# Patient Record
Sex: Male | Born: 1950 | Race: Black or African American | Hispanic: No | Marital: Married | Smoking: Former smoker
Health system: Southern US, Community
[De-identification: ages and names within clinical notes are randomized; demographics above are authoritative.]

## PROBLEM LIST (undated history)

## (undated) DIAGNOSIS — I5042 Chronic combined systolic (congestive) and diastolic (congestive) heart failure: Secondary | ICD-10-CM

## (undated) DIAGNOSIS — I4892 Unspecified atrial flutter: Secondary | ICD-10-CM

## (undated) DIAGNOSIS — I442 Atrioventricular block, complete: Secondary | ICD-10-CM

## (undated) DIAGNOSIS — E7800 Pure hypercholesterolemia, unspecified: Secondary | ICD-10-CM

## (undated) DIAGNOSIS — I10 Essential (primary) hypertension: Secondary | ICD-10-CM

## (undated) DIAGNOSIS — I4719 Other supraventricular tachycardia: Secondary | ICD-10-CM

## (undated) DIAGNOSIS — J189 Pneumonia, unspecified organism: Secondary | ICD-10-CM

## (undated) DIAGNOSIS — D6859 Other primary thrombophilia: Secondary | ICD-10-CM

## (undated) DIAGNOSIS — I82409 Acute embolism and thrombosis of unspecified deep veins of unspecified lower extremity: Secondary | ICD-10-CM

## (undated) DIAGNOSIS — I471 Supraventricular tachycardia: Secondary | ICD-10-CM

## (undated) DIAGNOSIS — I428 Other cardiomyopathies: Secondary | ICD-10-CM

## (undated) DIAGNOSIS — Z95810 Presence of automatic (implantable) cardiac defibrillator: Secondary | ICD-10-CM

## (undated) DIAGNOSIS — Z950 Presence of cardiac pacemaker: Secondary | ICD-10-CM

## (undated) DIAGNOSIS — I2699 Other pulmonary embolism without acute cor pulmonale: Secondary | ICD-10-CM

## (undated) DIAGNOSIS — G4730 Sleep apnea, unspecified: Secondary | ICD-10-CM

## (undated) DIAGNOSIS — M542 Cervicalgia: Secondary | ICD-10-CM

## (undated) HISTORY — DX: Atrioventricular block, complete: I44.2

## (undated) HISTORY — PX: INSERT / REPLACE / REMOVE PACEMAKER: SUR710

## (undated) HISTORY — DX: Other pulmonary embolism without acute cor pulmonale: I26.99

## (undated) HISTORY — DX: Other primary thrombophilia: D68.59

## (undated) HISTORY — DX: Cervicalgia: M54.2

## (undated) HISTORY — PX: FRACTURE SURGERY: SHX138

---

## 1978-10-06 HISTORY — PX: INGUINAL HERNIA REPAIR: SUR1180

## 1997-12-23 ENCOUNTER — Encounter (HOSPITAL_BASED_OUTPATIENT_CLINIC_OR_DEPARTMENT_OTHER): Payer: Self-pay | Admitting: General Surgery

## 1997-12-26 ENCOUNTER — Ambulatory Visit (HOSPITAL_COMMUNITY): Admission: RE | Admit: 1997-12-26 | Discharge: 1997-12-26 | Payer: Self-pay | Admitting: General Surgery

## 2005-02-04 HISTORY — PX: FOOT FRACTURE SURGERY: SHX645

## 2005-11-29 ENCOUNTER — Ambulatory Visit (HOSPITAL_BASED_OUTPATIENT_CLINIC_OR_DEPARTMENT_OTHER): Admission: RE | Admit: 2005-11-29 | Discharge: 2005-11-29 | Payer: Self-pay | Admitting: Orthopedic Surgery

## 2005-12-04 ENCOUNTER — Inpatient Hospital Stay (HOSPITAL_COMMUNITY): Admission: EM | Admit: 2005-12-04 | Discharge: 2005-12-09 | Payer: Self-pay | Admitting: Emergency Medicine

## 2005-12-05 ENCOUNTER — Encounter: Payer: Self-pay | Admitting: Vascular Surgery

## 2006-01-03 ENCOUNTER — Ambulatory Visit: Payer: Self-pay | Admitting: Internal Medicine

## 2006-04-18 ENCOUNTER — Ambulatory Visit: Payer: Self-pay | Admitting: *Deleted

## 2006-04-18 ENCOUNTER — Encounter (INDEPENDENT_AMBULATORY_CARE_PROVIDER_SITE_OTHER): Payer: Self-pay | Admitting: *Deleted

## 2006-04-18 ENCOUNTER — Ambulatory Visit (HOSPITAL_COMMUNITY): Admission: RE | Admit: 2006-04-18 | Discharge: 2006-04-18 | Payer: Self-pay | Admitting: *Deleted

## 2006-05-08 ENCOUNTER — Ambulatory Visit: Payer: Self-pay | Admitting: Internal Medicine

## 2006-11-20 ENCOUNTER — Ambulatory Visit: Payer: Self-pay | Admitting: Oncology

## 2007-01-23 ENCOUNTER — Ambulatory Visit: Payer: Self-pay | Admitting: Oncology

## 2007-01-23 LAB — CBC WITH DIFFERENTIAL/PLATELET
BASO%: 0.4 % (ref 0.0–2.0)
Basophils Absolute: 0 10*3/uL (ref 0.0–0.1)
EOS%: 1.7 % (ref 0.0–7.0)
Eosinophils Absolute: 0.1 10*3/uL (ref 0.0–0.5)
HCT: 40.4 % (ref 38.7–49.9)
HGB: 14 g/dL (ref 13.0–17.1)
LYMPH%: 34.6 % (ref 14.0–48.0)
MCH: 29.9 pg (ref 28.0–33.4)
MCHC: 34.7 g/dL (ref 32.0–35.9)
MCV: 86 fL (ref 81.6–98.0)
MONO#: 0.4 10*3/uL (ref 0.1–0.9)
MONO%: 10.2 % (ref 0.0–13.0)
NEUT#: 2.2 10*3/uL (ref 1.5–6.5)
NEUT%: 53.1 % (ref 40.0–75.0)
Platelets: 209 10*3/uL (ref 145–400)
RBC: 4.7 10*6/uL (ref 4.20–5.71)
RDW: 15.3 % — ABNORMAL HIGH (ref 11.2–14.6)
WBC: 4.2 10*3/uL (ref 4.0–10.0)
lymph#: 1.5 10*3/uL (ref 0.9–3.3)

## 2007-01-23 LAB — PROTIME-INR: INR: 2.8 (ref 2.00–3.50)

## 2007-01-27 LAB — FACTOR 5 LEIDEN

## 2007-01-27 LAB — PROTHROMBIN GENE MUTATION

## 2007-02-03 ENCOUNTER — Encounter: Payer: Self-pay | Admitting: Internal Medicine

## 2007-02-03 LAB — PROTHROMBIN TIME: Prothrombin Time: 25.8 seconds — ABNORMAL HIGH (ref 11.6–15.2)

## 2007-02-03 LAB — CBC WITH DIFFERENTIAL/PLATELET
BASO%: 0.9 % (ref 0.0–2.0)
LYMPH%: 37.7 % (ref 14.0–48.0)
MCHC: 34.4 g/dL (ref 32.0–35.9)
MCV: 86.6 fL (ref 81.6–98.0)
MONO%: 9.4 % (ref 0.0–13.0)
NEUT%: 51.3 % (ref 40.0–75.0)
Platelets: 215 10*3/uL (ref 145–400)
RBC: 4.9 10*6/uL (ref 4.20–5.71)

## 2007-02-06 LAB — D-DIMER, QUANTITATIVE: D-Dimer, Quant: 0.22 ug/mL-FEU (ref 0.00–0.48)

## 2007-02-12 ENCOUNTER — Ambulatory Visit: Payer: Self-pay | Admitting: Surgery

## 2010-02-25 ENCOUNTER — Encounter: Payer: Self-pay | Admitting: *Deleted

## 2010-06-19 NOTE — Procedures (Signed)
DUPLEX DEEP VENOUS EXAM - LOWER EXTREMITY   INDICATION:  Followup, right lower extremity thrombus and previous PE.   HISTORY:  Edema:  No.  Trauma/Surgery:  Multiple fractures in right lower extremity in 2007.  Pain:  No.  PE:  Previously, yes.  Previous DVT:  Right greater saphenous vein SVT noted on 04/18/2006.  Anticoagulants:  No.  Other:   DUPLEX EXAM:                CFV   SFV   PopV  PTV    GSV                R  L  R  L  R  L  R   L  R  L  Thrombosis    0  0  +     0     0      +  Spontaneous   +  +  +     +     +      D  Phasic        +  +  +     +     +      D  Augmentation  +  +  +     +     +      D  Compressible  +  +  +     +     +      D  Competent     +  +  +     +     +      D   Legend:  + - yes  o - no  p - partial  D - decreased   IMPRESSION:  1. The superficial femoral vein in the proximal thigh shows a very      minimal focal area of chronic deep venous thrombosis that is not      flow-limiting.  2. All other deep veins appear patent.  3. Right greater saphenous vein in the calf shows chronic thrombus.  4. Left common femoral vein shows no evidence of deep venous      thrombosis.   Dr. Patsy Lager office called with results.    _____________________________  Quita Skye Hart [place], M.D.   AS/MEDQ  D:  02/12/2007  T:  02/13/2007  Job:  [id number]

## 2010-06-22 NOTE — Assessment & Plan Note (Signed)
[facility]                             PULMONARY OFFICE NOTE   Philip Chavez, Philip Chavez                        MRN:          [id number]  DATE:01/03/2006                            DOB:          [birth year]    REFERRING PHYSICIAN:  Lucita Ferrara, MD   REASON FOR CONSULTATION:  Pulmonary embolism.   HISTORY:  This is a very nice 60 year old back male, never smoker, who  was anthelotic and very active prior to injuring his right foot at work  on the 26th with a fracture of the 4th and 5th metatarsal joints.  He  underwent surgery on the 26th, then woke up with dyspnea and bilateral,  right greater than left, anterior chest discomfort on October 31 and was  diagnosed bilateral pulmonary emboli in the emergency room.  Attempt was  made to do a venous Doppler of the right leg, but because of the cast,  could not be done.  He was anticoagulated, and returns now at Dr.  Vertis Kelch request for evaluation.  The patient denies any dyspnea, but of  course is very limited because of his cast.  His pleuritic pain resolved  by the time he was discharged, and has not recurred.  He denies any  excess leg swelling or family history of clotting disorders.   PAST MEDICAL HISTORY:  Significant for the absences of major operations  or medical illnesses.   ALLERGIES:  NONE KNOWN.   MEDICATIONS:  Coumadin per Dr. Vertis Kelch office.   SOCIAL HISTORY:  He has never smoked.   FAMILY HISTORY:  Taken in detail to give [place] the absence of clotting  disorders.   REVIEW OF SYSTEMS:  Also taken in detail, negative except for outlined  above.   PHYSICAL EXAMINATION:  This is a healthy-appearing, ambulatory black  male, in no acute distress.  He does not appear significantly  overweight.  Afebrile, normal vital signs.  HEENT:  Mouth and oropharynx clear.  LUNG FIELDS:  Clear bilaterally to auscultation and percussion.  There  is no increase in pulmonary __________.  ABDOMEN:  Soft and  benign with no palpable organomegaly, masses or  tenderness.  EXTREMITIES:  Warm without calf tenderness, cyanosis or clubbing, though  he did have a cast on the right leg.   Hemoglobin saturation is 96% room air.   CT scan was reviewed from December 04, 2005 showing extensive bilateral  emboli.  There was small pleural calcification in the right lower chest  of unknown significance.   Lab data was reviewed from the hospitalization.  I do not see a  hypercoagulable profile, although it is mentioned by Dr. Flonnie Overman that this  was negative.   IMPRESSION:  1. Pulmonary embolism in the setting of right foot trauma followed by      right foot surgery.  He has no evidence of right heart failure, and      as long as he progresses completely back to normal in terms of      exercise tolerance, I would not recommend any further workup from a      pulmonary  perspective.  If he is still limited at 6 months from      doing any activity physically that he would have otherwise been      able to do prior to the pulmonary embolism, I would like to see him      back here to examine the issue of possible thromboembolic pulmonary      hypertension, although this seems very unlikely.  2. I feel a little bit uncomfortable not knowing whether he had a clot      in the right leg in terms of making a recommendation, but the      normal recommendation is for 6 months of therapy.  At the end of 6      months, I would probably go ahead and do a venous Doppler then to      see if there is any residual evidence of venous disease.  If so,      Coumadin could be continued indefinitely.  If not, Coumadin could      stopped.  3. Having had a pulmonary embolism previously with no family history,      I do not necessarily think a hypercoagulable profile is necessary      given the risk factor of recent surgery, but from now on the      patient should take a baby aspirin, and was cautioned against      activities that  promote clotting such as long trips or      immobilization, etc.  4. I did emphasize to the patient that his prognosis is excellent, and      that we would be happy to see him back here if he is not 100%      improved by the end of 6 months, but that 6 months of Coumadin is      standard for this pattern of clot.     Charlaine Dalton. Sherene Sires, MD, [facility]  Electronically Signed    MBW/MedQ  DD: 01/03/2006  DT: 01/04/2006  Job #: [id number]   cc:   Lucita Ferrara, MD

## 2010-06-22 NOTE — Op Note (Signed)
[name], [name]:  [id number]   MEDICAL RECORD NO.:  [id number]          PATIENT TYPE:  AMB   LOCATION:  DSC                          FACILITY:  [facility]   PHYSICIAN:  Harvie Junior, M.D.   DATE OF BIRTH:  02/04/1951   DATE OF PROCEDURE:  11/29/2005  DATE OF DISCHARGE:                                 OPERATIVE REPORT   PREOPERATIVE DIAGNOSIS:  1. Fracture-dislocation of the fourth and fifth metatarsal cuboid joints.  2. Fracture-dislocation of the naviculocuneiform joints.   POSTOPERATIVE DIAGNOSIS:  1. Fresh fracture-dislocation of the fourth and fifth metatarsal cuboid      joints.  2. 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   SURGEON:  Harvie Junior, M.D.   ASSISTANT:  Marshia Ly, P.A.   ANESTHESIA:  General.   BRIEF HISTORY:  Philip Chavez is a 60 year old male with a long history of  having had a fall from a ladder.  He ultimately was evaluated by Dr.  Althea Chavez who ultimately got a CT scan of him which showed that he had some  dislocation of the metatarsal fourth and fifth metatarsophalangeal joint and  also showed some questionable fracture of the anterior process of the  calcaneus as well as some injury to the naviculocuneiform joint.  He was  treated conservatively initially and CT scan was obtained which showed all  of this injury and then he was sent for evaluation.  At that point he did  appear to have pain over the midfoot as well as pain over the lateral foot  and with the CAT scan images in hand and after a discussion with the  radiologist about the findings, we certainly felt as though he had this  unusual injury pattern where instead of the injury going across the Lisfranc  joint it went across the Lisfranc joint and then through the talonavicular  joint.  Anyway he was taken to the operating room for treatment of these  injuries.   PROCEDURE:  Patient was taken to the  operating room.  After adequate  anesthesia was obtained with general anesthetic, the patient was placed on  the operating table.  The right leg was prepped and draped in the usual,  sterile fashion.  Following this the palpation of the fourth and fifth  metatarsals was palpated and certainly there was dorsal subluxation of the  fourth on the cuboid.  This was pushed down and distracted and this was  pinned to the third metatarsal.  Good fixation was achieved here.  The fifth  metatarsal was then pulled into place and was able to be fixed to the  cuboid.   Attention was then turned towards the midfoot and with significant palpation  and mobility of the foot, no significant clicks or clunks were palpated.  At  that point it was felt that open reduction would be necessary.  An incision  was made under fluoroscopic imaging essentially between the talus and the  calcaneus and in between the cuboid and the middle cuneiform and over  the  talus in that area.  The subcutaneous tissue was dissected down to the level  of these joints and the joint capsules were identified and basically the  talus was identified.  The navicular was identified.  The medial middle and  lateral cuneiform were identified.  The cuboid was identified.  We again  began trying to reduce this situation in the cuneiform complex, which was  united together, really could not be brought plantar at all.  The navicular  could be attempted to be brought dorsally but certainly would not hold.  We  were able to get an elevator under the navicular.  We were able to put  pressure on the medial side of the navicular and with significant pressure  and taking a lamina spreader between the talus and the cuneiforms, the  navicular would not reduce in any way and the cuneiforms would not reduce on  the navicular.   At that point we began to feel as though this injury may be somewhat old.  Reviewing of the CAT scan intraoperatively did show  that there was some non  smooth articular surface to the distal navicular, wondering again could this  be an old injury.  At any rate the reduction of the most lateral cuneiform  to the cuboid was undertaken and that held without needing significant  fixation.  This wound was then copiously irrigated.  We spent about an hour  trying to reduce this situation and became very convinced at that point that  this was not a new situation, that would not reduce.   At that point the wound was copiously irrigated.  The skin was closed with 4-  0 nylon interrupted sutures.  A sterile compressive dressing was applied and  the pins were bent and cut to the fourth and fifth metatarsal fractures and  a sterile compressive dressing was applied.  The patient was taken to  recovery and was noted to be in satisfactory condition.   ESTIMATED BLOOD LOSS:  None.      Harvie Junior, M.D.  Electronically Signed     JLG/MEDQ  D:  11/29/2005  T:  11/30/2005  Job:  [id number]

## 2010-06-22 NOTE — Assessment & Plan Note (Signed)
[facility]                             PULMONARY OFFICE NOTE   MARQUAIL, BRADWELL                        MRN:          [id number]  DATE:05/08/2006                            DOB:          May 17, 1950    HISTORY:  A 60 year old black male, never smoker, with pulmonary  embolism in early November of 2007. My understanding is that he had  venous Doppler, then it was negative and he returns now 6 months later  with no significant dyspnea, but underwent a venous Doppler prior to the  decision about stopping Coumadin which showed extensive clot involving  the saphenous vein on the right, the same side as his injury.  He denies  any dyspnea at all with activity but is not doing aerobics.  He says he  is doing everything he was doing before his injury, in terms of  exertion, without dyspnea or pleuritic pain.   PHYSICAL EXAMINATION:  He is a robust, ambulatory black man in no acute  distress.  He has stable vital signs.  HEENT:  Unremarkable.  Oropharynx clear.  LUNGS:  Clear bilaterally to auscultation and percussion.  HEART:  Regular rate and rhythm.  No increase in P2.  ABDOMEN:  Soft, benign.  EXTREMITIES:  Warm without calf tenderness, cyanosis, clubbing or edema.  The right leg appeared normal to me.   IMPRESSION:  Asymptomatic clot involving the greater saphenous vein of  the right lower extremity by venous Doppler.  It may well be that this  was a clot that extended from the original injury and was not detectable  at the time of the original venous Doppler because he had a cast on the  leg.  This does not mean that he is refractory to Coumadin by any  means, because note all of his pulmonary symptoms have resolved.  It is,  however, an indication that he should continue on Coumadin at least for  the next six months and have a venous Doppler repeated at that time.  I  would also at that time consider a hematology evaluation by Dr.  Marlena Clipper  for hypercoagulable workup and certainly this should be  done earlier should he develop any symptoms that would suggest deep vein  thrombosis, pulmonary embolisms while on therapy with Coumadin.  In  general, however, the hypercoagulable workup needs to be done a week  after he stops Coumadin to avoid false positive on the workup.   Further pulmonary followup, however, is not needed in this clinic unless  Dr. Flonnie Overman has specific questions related to his management that are not  answered above.     Charlaine Dalton. Sherene Sires, MD, [facility]  Electronically Signed    MBW/MedQ  DD: 05/08/2006  DT: 05/08/2006  Job #: [id number]   cc:   Lucita Ferrara, MD

## 2010-06-22 NOTE — Discharge Summary (Signed)
Philip Chavez, Philip Chavez               ACCOUNT NO.:  [id number]   MEDICAL RECORD NO.:  [id number]          PATIENT TYPE:  INP   LOCATION:  4728                         FACILITY:  [facility]   PHYSICIAN:  Hollice Espy, M.D.DATE OF BIRTH:  May 25, 1950   DATE OF ADMISSION:  12/04/2005  DATE OF DISCHARGE:  12/09/2005                                 DISCHARGE SUMMARY   DISCHARGE DIAGNOSES:  1. Multiple pulmonary emboli.  2. Recent injury to right foot secondary to status post repair of fracture      dislocation of fourth and fifth metatarsal likely contributing to deep      venous thrombi and eventual pulmonary embolus.   DISCHARGE MEDICATIONS:  Coumadin 10 mg p.o. q.h.s.   HOSPITAL COURSE:  The patient is a 60 year old African-American male with a  no past medical history who presented to the emergency room with shortness  of breath on December 04, 2005.  He had recently had a fall from a ladder  leading to fracture dislocation of his fourth and fifth toes on his right  foot and underwent surgical repair on November 29, 2005.  Since that time, he  has been laid up and has decreased mobility.  He presented with shortness of  breath and was found by CT scan to have bilateral PE's.  On evaluation, he  had no evidence of any hypoxia, and the patient was started on Lovenox and  Coumadin.   His hospital course was unremarkable.  He was started on the Coumadin and  initially heparin protocol which was changed over to Lovenox during his  hospitalization.  He initially was on 10 mg of Coumadin with a slow increase  which was up to 12.5 and 15 mg; and by November 08, 2005, his Coumadin level  was at 2.  At this point, we are discharging him on 10 mg of Coumadin p.o.  q.h.s.  He has also received a Coumadin instruction video.  I set up an  appointment with Dr. __________ at [facility] on December 13, 2005 at 10  a.m.  The patient will follow up with Dr. __________ for repeat Coumadin  check.   The  patient's overall disposition is improved.  His activity is as  tolerated.  He is able to ambulate fairly well on his crutches.  His  discharge diet will be a regular diet; although, he is advised to avoid  beefy green vegetables, and he is being discharged to home.      Hollice Espy, M.D.  Electronically Signed     SKK/MEDQ  D:  12/09/2005  T:  12/09/2005  Job:  [id number]   cc:   Dr. __________

## 2010-06-22 NOTE — H&P (Signed)
NAMEKOLE, HILYARD NO.:  [id number]   MEDICAL RECORD NO.:  [id number]          PATIENT TYPE:  INP   LOCATION:  1823                         FACILITY:  [facility]   PHYSICIAN:  Deirdre Peer. Polite, M.D. DATE OF BIRTH:  [birth year]   DATE OF ADMISSION:  12/04/2005  DATE OF DISCHARGE:                                HISTORY & PHYSICAL   CHIEF COMPLAINT:  Shortness of breath.   HISTORY OF PRESENT ILLNESS:  A 60 year old male with no significant past  medical history.  He presented to the ED with acute onset of shortness of  breath. According to the patient he was in his usual state of health until  last night when he had the acute onset of shortness of breath.  He thought  it was gas. He took some Mylanta which he thought that he may have gotten  some relief; however, in fact he really did not.  He tolerated the  discomfort throughout the night and presented to the ED today.  In the ED  the patient was evaluated and was saturating 97%. Vitals were stable.  CBC  within normal limits.  BMET within normal limits.  UA within normal limits.  The patient's chest x-ray was within normal limits.  The patient had a CT of  the chest which showed bilateral PEE, and this ED for further evaluation and  treatment.   At the time of my evaluation the patient was alert and oriented x3, still  with some symptoms of shortness of breath.  Denies any hemoptysis.  No fever  or chills.  Denies any previous clots in the lung or in the leg.  Denies any  family history of those problems as well. Admission is deemed necessary for  further evaluation and treatment.   PAST MEDICAL HISTORY:  None.   MEDICATIONS:  None.   SOCIAL HISTORY:  Negative for tobacco, alcohol or drugs.   PAST SURGICAL HISTORY:  Left inguinal hernia repair approximately 7-8 years  ago.  Recent right foot surgery secondary to a traumatic injury.  The  patient had TENS, according to him to repair fracture/dislocation of  the  fourth and fifth metatarsal cuboid joints also fracture dislocation of the  naviculocuneiform joints.   ALLERGIES:  None.   FAMILY HISTORY:  Noncontributory.   REVIEW OF SYSTEMS:  As stated in the HPI.   PHYSICAL EXAMINATION:  GENERAL:  Alert and oriented x3.  VITAL SIGNS:  Stable, afebrile.  HEENT:  Within normal limits.  CHEST:  Moderate air movement without rales or rubs.  CARDIOVASCULAR:  Regular.  ABDOMEN:  Soft, nontender.  EXTREMITIES:  Right lower leg is wrapped in a soft cast with Ace bandage.  The patient currently denies any calf pain.  Left leg without any calf pain  or tenderness.   DATA:  As stated in the HPI.   ASSESSMENT:  1. Bilateral pulmonary embolism.  2. Recent trauma to the right foot. The patient stated that he fell off of      a ladder, had surgery on 11/29/2005 for repair of the fracture  dislocation of the fourth and fifth metatarsal cuboid joints and      fracture dislocation of the naviculocuneiform joints.   RECOMMEND:  The patient is to be admitted to a telemetry bed.  The patient  will be given IV heparin, crossed over with Coumadin.  Will obtain and  ultrasound to rule out DVT.  The patient's risk factor more than likely is  recent trauma and surgery with resultant immobilization.  Will make further  recommendations after obtaining x-ray.      Deirdre Peer. Polite, M.D.  Electronically Signed     RDP/MEDQ  D:  12/04/2005  T:  12/04/2005  Job:  [id number]

## 2013-03-11 ENCOUNTER — Inpatient Hospital Stay (HOSPITAL_COMMUNITY)
Admission: EM | Admit: 2013-03-11 | Discharge: 2013-03-18 | DRG: 242 | Disposition: A | Discharge status: Home / Self Care | Payer: BC Managed Care – PPO | Attending: Family Medicine | Admitting: Family Medicine

## 2013-03-11 ENCOUNTER — Emergency Department (HOSPITAL_COMMUNITY): Payer: BC Managed Care – PPO

## 2013-03-11 ENCOUNTER — Emergency Department (HOSPITAL_COMMUNITY)
Admission: EM | Admit: 2013-03-11 | Discharge: 2013-03-11 | Disposition: A | Discharge status: Hospice | Payer: BC Managed Care – PPO | Source: Home / Self Care | Attending: Family Medicine | Admitting: Family Medicine

## 2013-03-11 ENCOUNTER — Inpatient Hospital Stay (HOSPITAL_COMMUNITY): Payer: BC Managed Care – PPO

## 2013-03-11 ENCOUNTER — Encounter (HOSPITAL_COMMUNITY): Payer: Self-pay | Admitting: Emergency Medicine

## 2013-03-11 DIAGNOSIS — M7989 Other specified soft tissue disorders: Secondary | ICD-10-CM

## 2013-03-11 DIAGNOSIS — M542 Cervicalgia: Secondary | ICD-10-CM

## 2013-03-11 DIAGNOSIS — I82409 Acute embolism and thrombosis of unspecified deep veins of unspecified lower extremity: Secondary | ICD-10-CM

## 2013-03-11 DIAGNOSIS — Z86711 Personal history of pulmonary embolism: Secondary | ICD-10-CM

## 2013-03-11 DIAGNOSIS — I472 Ventricular tachycardia, unspecified: Secondary | ICD-10-CM | POA: Diagnosis present

## 2013-03-11 DIAGNOSIS — Z86718 Personal history of other venous thrombosis and embolism: Secondary | ICD-10-CM | POA: Diagnosis present

## 2013-03-11 DIAGNOSIS — I4729 Other ventricular tachycardia: Secondary | ICD-10-CM | POA: Diagnosis present

## 2013-03-11 DIAGNOSIS — Z7901 Long term (current) use of anticoagulants: Secondary | ICD-10-CM

## 2013-03-11 DIAGNOSIS — D6859 Other primary thrombophilia: Secondary | ICD-10-CM

## 2013-03-11 DIAGNOSIS — I498 Other specified cardiac arrhythmias: Secondary | ICD-10-CM | POA: Diagnosis present

## 2013-03-11 DIAGNOSIS — I2699 Other pulmonary embolism without acute cor pulmonale: Secondary | ICD-10-CM | POA: Diagnosis present

## 2013-03-11 DIAGNOSIS — I442 Atrioventricular block, complete: Principal | ICD-10-CM

## 2013-03-11 DIAGNOSIS — I824Y9 Acute embolism and thrombosis of unspecified deep veins of unspecified proximal lower extremity: Secondary | ICD-10-CM | POA: Diagnosis present

## 2013-03-11 DIAGNOSIS — R9431 Abnormal electrocardiogram [ECG] [EKG]: Secondary | ICD-10-CM

## 2013-03-11 HISTORY — DX: Acute embolism and thrombosis of unspecified deep veins of unspecified lower extremity: I82.409

## 2013-03-11 LAB — CBC WITH DIFFERENTIAL/PLATELET
BASOS PCT: 0 % (ref 0–1)
Basophils Absolute: 0 10*3/uL (ref 0.0–0.1)
EOS PCT: 1 % (ref 0–5)
Eosinophils Absolute: 0.1 10*3/uL (ref 0.0–0.7)
HCT: 42.4 % (ref 39.0–52.0)
Hemoglobin: 14.9 g/dL (ref 13.0–17.0)
LYMPHS PCT: 30 % (ref 12–46)
Lymphs Abs: 1.5 10*3/uL (ref 0.7–4.0)
MCH: 30.8 pg (ref 26.0–34.0)
MCHC: 35.1 g/dL (ref 30.0–36.0)
MCV: 87.8 fL (ref 78.0–100.0)
Monocytes Absolute: 0.5 10*3/uL (ref 0.1–1.0)
Monocytes Relative: 10 % (ref 3–12)
Neutro Abs: 3 10*3/uL (ref 1.7–7.7)
Neutrophils Relative %: 59 % (ref 43–77)
PLATELETS: 201 10*3/uL (ref 150–400)
RBC: 4.83 MIL/uL (ref 4.22–5.81)
RDW: 13.2 % (ref 11.5–15.5)
WBC: 5.1 10*3/uL (ref 4.0–10.5)

## 2013-03-11 LAB — COMPREHENSIVE METABOLIC PANEL
ALT: 27 U/L (ref 0–53)
AST: 24 U/L (ref 0–37)
Albumin: 3.7 g/dL (ref 3.5–5.2)
Alkaline Phosphatase: 92 U/L (ref 39–117)
BUN: 15 mg/dL (ref 6–23)
CALCIUM: 9.4 mg/dL (ref 8.4–10.5)
CO2: 26 meq/L (ref 19–32)
Chloride: 103 mEq/L (ref 96–112)
Creatinine, Ser: 1.04 mg/dL (ref 0.50–1.35)
GFR calc Af Amer: 87 mL/min — ABNORMAL LOW (ref >90)
GFR calc non Af Amer: 75 mL/min — ABNORMAL LOW (ref >90)
Glucose, Bld: 106 mg/dL — ABNORMAL HIGH (ref 70–99)
Potassium: 4.5 mEq/L (ref 3.7–5.3)
SODIUM: 140 meq/L (ref 137–147)
Total Bilirubin: 0.3 mg/dL (ref 0.3–1.2)
Total Protein: 8.2 g/dL (ref 6.0–8.3)

## 2013-03-11 LAB — TROPONIN I: Troponin I: <0.3 ng/mL (ref <0.30)

## 2013-03-11 LAB — PROTIME-INR
INR: 1.01 (ref 0.00–1.49)
Prothrombin Time: 13.1 seconds (ref 11.6–15.2)

## 2013-03-11 LAB — APTT: APTT: 25 s (ref 24–37)

## 2013-03-11 LAB — MAGNESIUM: MAGNESIUM: 1.9 mg/dL (ref 1.5–2.5)

## 2013-03-11 LAB — MRSA PCR SCREENING: MRSA BY PCR: NEGATIVE

## 2013-03-11 MED ORDER — HEPARIN BOLUS VIA INFUSION
4000.0000 [IU] | Freq: Once | INTRAVENOUS | Status: AC
  Administered 2013-03-11: 4000 [IU] via INTRAVENOUS
  Filled 2013-03-11: qty 4000

## 2013-03-11 MED ORDER — NITROGLYCERIN 0.4 MG SL SUBL
SUBLINGUAL_TABLET | SUBLINGUAL | Status: AC
  Filled 2013-03-11: qty 25

## 2013-03-11 MED ORDER — SODIUM CHLORIDE 0.9 % IJ SOLN
3.0000 mL | Freq: Two times a day (BID) | INTRAMUSCULAR | Status: DC
  Administered 2013-03-11 – 2013-03-17 (×7): 3 mL via INTRAVENOUS
  Administered 2013-03-17: 21:00:00 via INTRAVENOUS
  Administered 2013-03-18: 11:00:00 3 mL via INTRAVENOUS

## 2013-03-11 MED ORDER — SODIUM CHLORIDE 0.9 % IV SOLN: Freq: Once | INTRAVENOUS | Status: DC

## 2013-03-11 MED ORDER — RIVAROXABAN 20 MG PO TABS: 20.0000 mg | ORAL_TABLET | Freq: Every day | ORAL | Status: DC

## 2013-03-11 MED ORDER — IOHEXOL 350 MG/ML SOLN
100.0000 mL | Freq: Once | INTRAVENOUS | Status: AC | PRN
  Administered 2013-03-11: 100 mL via INTRAVENOUS

## 2013-03-11 MED ORDER — NITROGLYCERIN 0.4 MG SL SUBL: 0.4000 mg | SUBLINGUAL_TABLET | SUBLINGUAL | Status: DC | PRN

## 2013-03-11 MED ORDER — IBUPROFEN 800 MG PO TABS
800.0000 mg | ORAL_TABLET | Freq: Once | ORAL | Status: AC
  Administered 2013-03-11: 800 mg via ORAL
  Filled 2013-03-11: qty 1

## 2013-03-11 MED ORDER — HEPARIN (PORCINE) IN NACL 100-0.45 UNIT/ML-% IJ SOLN
1600.0000 [IU]/h | INTRAMUSCULAR | Status: DC
  Administered 2013-03-11 (×3): 1600 [IU]/h via INTRAVENOUS
  Filled 2013-03-11: qty 250

## 2013-03-11 MED ORDER — CYCLOBENZAPRINE HCL 10 MG PO TABS
5.0000 mg | ORAL_TABLET | Freq: Once | ORAL | Status: AC
  Administered 2013-03-11: 5 mg via ORAL
  Filled 2013-03-11: qty 1

## 2013-03-11 MED ORDER — SODIUM CHLORIDE 0.9 % IV SOLN
Freq: Once | INTRAVENOUS | Status: AC
  Administered 2013-03-11: 11:00:00 via INTRAVENOUS

## 2013-03-11 MED ORDER — ASPIRIN 81 MG PO CHEW
324.0000 mg | CHEWABLE_TABLET | Freq: Once | ORAL | Status: AC
  Administered 2013-03-11: 324 mg via ORAL

## 2013-03-11 MED ORDER — ASPIRIN 81 MG PO CHEW
CHEWABLE_TABLET | ORAL | Status: AC
  Filled 2013-03-11: qty 4

## 2013-03-11 MED ORDER — RIVAROXABAN 15 MG PO TABS
15.0000 mg | ORAL_TABLET | Freq: Two times a day (BID) | ORAL | Status: DC
Start: 2013-03-11 — End: 2013-03-15
  Administered 2013-03-11 – 2013-03-14 (×7): 15 mg via ORAL
  Filled 2013-03-11 (×11): qty 1

## 2013-03-11 NOTE — H&P (Signed)
[facility] Admission History and Physical Service Pager: [phone]  Patient name: Philip Chavez Medical record number: [id number] Date of birth: 16-Aug-1950 Age: 63 y.o. Gender: male  Primary Care Provider: Pcp Not In System Consultants: Cardiology Code Status: Full  Chief Complaint: swollen left leg  Assessment and Plan: Philip Chavez is a 63 y.o. male presenting with 5 day history of left swollen leg and found to have DVT extending from posterior tibial vein to common femoral vein & bilateral PE with RLL infarct, also new diagnosis of 3rd degree AV block. 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  # LLE DVT & Bilateral PE: ultrasound confirmed DVT of left leg in posterior tibial, popliteal, femoral, common femoral veins; CFV DVT appears to be mobile. Denies SOB or CP, but CT angio PE shows bilateral lower lobe acute PE (moderate burden) and small RLL pulmonary infarct. CXR showed atelectasis vs infiltrate of RLL, however given CT this is likely the infarct (no WBC, no fever). Will start on heparin drip but will need to decide xarelto vs coumadin (previously on coumadin). Lab workup otherwise normal, no leukocytosis or anemia, trop neg x 1. Has previous workup for hypercoagulability in 2008 showing negative FVL, prothrombin gene mutation, antithrombin III level normal. - admit to stepdown, attending Dr. Mingo Amber - started on heparin DVT dosing per pharmacy  # Complete AV block: asymptomatic at this time, no syncopal episodes. Bradycardia to 40s, BP initially stable at 140-150s/60-70s. Seen by electrophysiology, likely recommending pacemaker. - telemetry - cycle troponins x 3 - appreciate cardiology recommendations in care of this patient - NPO until decision regarding pacer  # Neck pain: likely musculoskeletal in nature, very low suspicion for concerning infection like meningitis - received   FEN/GI: diet NPO pending cardiology recs, saline lock  iv Prophylaxis: on heparin per above  Disposition: admit to SDU  History of Present Illness: Philip Chavez is a 63 y.o. male presenting with 5 day history of sudden left leg swelling. He woke up and noticed his leg was swollen, but it is not painful and is not red or warm to touch. The swelling has remained the same since it started. He is able to walk without difficulty. 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 He denies CP or palpitations, SOB, abdominal pain, dizziness, passing out. He has a history of a DVT/PE in 2007 after he had surgery for a right foot fracture, he says he was on coumadin for about 1 year. He also woke up this morning with pain in the left side of his neck. He denies headache, fevers, chills. The pain hurts when he looks to the right. He thinks he may have slept in a bad position, as he has never had this pain before. The pain does not radiate when he moves his head.  Today he went to urgent care to be evaluated and was also found to have a very low heart rate in 40s, and on EKG appeared to be complete AV block. He was given nitroglycerin and aspirin before being sent over to ED to be evaluated further. Found to have a clot in left leg extending from posterior tibial to common femoral (CFV DVT appears mobile) on ultrasound. Seen by cardiology who has recommended a pacer.  Review Of Systems: Per HPI with the following additions: none Otherwise 12 point review of systems was performed and was unremarkable.  There  are no active problems to display for this patient.  Past Medical History: History reviewed. No pertinent past medical history. Past Surgical History: Past Surgical History  Procedure Laterality Date  . 2007 right foot fracture with surgical repair     Social History: History  Substance Use Topics  . Smoking status: Never  . Smokeless tobacco: Not on file  . Alcohol Use: Has not had alcohol  in years   Additional social history: none  Please also refer to relevant sections of EMR.  Family History: No history of clot/blood disorders Father and sister both have a pacer. Father has passed away. Brother also may have a heart condition  Allergies and Medications: No Known Allergies No current facility-administered medications on file prior to encounter.   No current outpatient prescriptions on file prior to encounter.    Objective: BP 151/74  Pulse 42  Temp(Src) 97.7 F (36.5 C) (Oral)  Resp 21  SpO2 100% Exam: General: NAD HEENT: PERRL, EOMI Neck: full ROM, mildly tender to palpation of left paraspinal area Cardiovascular: Bradycardic, but normal s1/s2, no murmurs Respiratory: CTAB, effort normal. No w/r/c appreciated Abdomen: soft, NTND, normal bowel sounds Extremities: Left leg/foot obviously more swollen than right. Left calf measures 42.5cm at the greatest, right calf measures 39cm. Nontender, no palpable cords. Both feet are WWP. Skin: warm and dry, no rashes noted Neuro: A+Ox4, no focal deficits. Strength 5/5 toe extension/flexion bilaterally, sensation to light touch intact to dorsum of foot bilaterally  Labs and Imaging: CBC BMET   Recent Labs Lab 03/11/13 1148  WBC 5.1  HGB 14.9  HCT 42.4  PLT 201    Recent Labs Lab 03/11/13 1148  NA 140  K 4.5  CL 103  CO2 26  BUN 15  CREATININE 1.04  GLUCOSE 106*  CALCIUM 9.4    INR 1.01 Trop neg x 1 Mag 1.9  X-ray c-spine IMPRESSION:  1. Reversal of the usual cervical lordosis which may reflect  positioning and/or spasm.  2. No evidence of fracture or static signs of instability.  3. Multilevel degenerative disc disease and spondylosis, worst at  C5-6, with multilevel foraminal stenoses.  2v CXR: IMPRESSION:  1. Atelectasis versus pneumonia involving the right lower lobe.  2. Stable mild cardiomegaly without pulmonary edema.  CT angio PE protocol: IMPRESSION:  1. Bilateral lower lobe  acute pulmonary emboli. Overall clot burden  is moderate.  2. Small right lower lobe pulmonary infarction.  Preliminary Vascular Ultrasound  Lower extremity venous duplex has been completed. Preliminary findings:  LEFT = DVT involving the common femoral, femoral, popliteal, and posterior tibial veins. The DVT noted in the CFV appears to be mobile.  Right = no evidence of DVT.   Tawanna Sat, MD 03/11/2013, 3:54 PM PGY-1, [place] Intern pager: [phone], text pages welcome  Teaching Service Addendum. I have seen and evaluated this pt and agree with Dr. Marissa Calamity assessment and plan as it is documented on this note.   D. [place] Philippa Sicks, MD Family Medicine  PGY-3

## 2013-03-11 NOTE — ED Notes (Signed)
Patient transported to CT 

## 2013-03-11 NOTE — ED Notes (Addendum)
Pt arrives via [facility] from Urgent Care. Pt went to Urgent Care this morning for neck stiffness that was present upon awakening this morning. Denies injury. Pt also c/o left leg swelling for approx 4-5 days. Pt denies chest pain, SOB, lightheaded, dizziness. Pt with bradycardia in the 40's.

## 2013-03-11 NOTE — Progress Notes (Signed)
*  PRELIMINARY RESULTS* Vascular Ultrasound Lower extremity venous duplex has been completed.  Preliminary findings:  LEFT = DVT involving the common femoral, femoral, popliteal, and posterior tibial veins. The DVT noted in the CFV appears to be mobile.  Right = no evidence of DVT.   Called results to Dr. Orpah Clinton, [place], RVT  03/11/2013, 3:35 PM

## 2013-03-11 NOTE — Consult Note (Signed)
ANTICOAGULATION CONSULT NOTE - Initial Consult  Pharmacy Consult for Heparin Indication: DVT, r/o PE  No Known Allergies  Patient Measurements: Height: 6\' 1"  (185.4 cm) Weight: 205 lb (92.987 kg) IBW/kg (Calculated) : 79.9 Heparin Dosing Weight: 92kg  Vital Signs: Temp: 97.7 F (36.5 C) (02/05 1143) Temp src: Oral (02/05 1143) BP: 165/75 mmHg (02/05 1544) Pulse Rate: 40 (02/05 1544)  Labs:  Recent Labs  03/11/13 1148  HGB 14.9  HCT 42.4  PLT 201  CREATININE 1.04  TROPONINI <0.30    Estimated Creatinine Clearance: 83.2 ml/min (by C-G formula based on Cr of 1.04).   Medical History: History reviewed. No pertinent past medical history.  Medications:  No anticoagulants pta  Assessment: 62yom presents to the ED with left leg pain and complete heart block. He is found to have a LLE DVT. CT angio pending to rule out PE. Also seen by EP who recommends PPM for his heart block, but patient unsure if he wants to proceed. He will begin IV heparin for now. Baseline labs wnl.  Goal of Therapy:  Heparin level 0.3-0.7 units/ml Monitor platelets by anticoagulation protocol: Yes   Plan:  1) Heparin bolus 4000 units x 1 2) Heparin drip at 1600 units/hr 3) 6 hour heparin level 4) Daily heparin level and CBC  Deboraha Sprang 03/11/2013,4:02 PM

## 2013-03-11 NOTE — ED Notes (Signed)
MD at bedside.-Dr. Knapp 

## 2013-03-11 NOTE — ED Notes (Signed)
Patient transported to Ultrasound 

## 2013-03-11 NOTE — ED Provider Notes (Signed)
CSN: [id number]     Arrival date & time 03/11/13  1128 History   First MD Initiated Contact with Patient 03/11/13 1129     Chief Complaint  Patient presents with  . Neck Pain  . Leg Swelling   (Consider location/radiation/quality/duration/timing/severity/associated sxs/prior Treatment) HPI Patient reports for the past 5-6 days he has had swelling of his left leg. He denies pain, he denies any wounds on his leg, he denies fever. He states he's never had it before. He does indicate he had DVT in his right leg many years ago after having surgery and also had a PE at that time. He states that the PE he had very severe sharp chest pain. He also states today he woke up with stiffness in the left side of his neck. He denies any change in activity. He's never had that before. He states the pain is in his left side of his neck it hurts when he looks to the right. He denies any numbness in his arm or tingling. He is able to walk and use his arms normally. He has never had this before. He denies chest pain, shortness of breath, dizziness, or feeling lightheaded. He denies feeling weak. He has not had cough, nausea, vomiting, diarrhea, rhinorrhea, or fever. He was seen at urgent care today and noted to have a bradycardia and was sent to the ED. He was given a nitroglycerin and aspirin prior to being transferred.  Family history father and sister both have defibrillators  PCP Dr Janice Norrie Orthopedist Dr Berenice Primas for continued foot pain after a old fracture in 2007 it didn't heal well.  History reviewed. No pertinent past medical history. Past Surgical History  Procedure Laterality Date  . No past surgeries    surgery to right leg  No family history on file.  History  Substance Use Topics  . Smoking status: No  . Smokeless tobacco: Not on file  . Alcohol Use: No  lives at home Lives with spouse Retired in September  Review of Systems  All other systems reviewed and are negative.    Allergies    Review of patient's allergies indicates no known allergies.  Home Medications  No current outpatient prescriptions on file. BP 146/70  Temp(Src) 97.7 F (36.5 C) (Oral)  Resp 17  SpO2 100%  Vital signs normal    Physical Exam  Nursing note and vitals reviewed. Constitutional: He is oriented to person, place, and time. He appears well-developed and well-nourished.  Non-toxic appearance. He does not appear ill. No distress.  HENT:  Head: Normocephalic and atraumatic.  Right Ear: External ear normal.  Left Ear: External ear normal.  Nose: Nose normal. No mucosal edema or rhinorrhea.  Mouth/Throat: Oropharynx is clear and moist and mucous membranes are normal. No dental abscesses or uvula swelling.  Eyes: Conjunctivae and EOM are normal. Pupils are equal, round, and reactive to light.  Neck: Normal range of motion and full passive range of motion without pain. Neck supple.     Patient indicates he has pain in his left paraspinous muscles of the cervical spine when he looks to the right. He does not have discomfort when he looks to his left. The muscle is very tight to palpation. His midline bony cervical spine is nontender.  Cardiovascular: Normal rate, regular rhythm and normal heart sounds.  Exam reveals no gallop and no friction rub.   No murmur heard. Pulmonary/Chest: Effort normal and breath sounds normal. No respiratory distress. He has no wheezes. He  has no rhonchi. He has no rales. He exhibits no tenderness and no crepitus.  Abdominal: Soft. Normal appearance and bowel sounds are normal. He exhibits no distension. There is no tenderness. There is no rebound and no guarding.  Musculoskeletal: Normal range of motion. He exhibits no edema and no tenderness.  Moves all extremities well. Patient has diffuse swelling of his left leg, ankle and dorsum of foot compared to his right leg. He has minor discomfort to palpation in the high proximal calf region. There are no open wounds.  There is no warmth. There is no redness to the skin.  Neurological: He is alert and oriented to person, place, and time. He has normal strength. No cranial nerve deficit.  Skin: Skin is warm, dry and intact. No rash noted. No erythema. No pallor.  Psychiatric: He has a normal mood and affect. His speech is normal and behavior is normal. His mood appears not anxious.    ED Course  Procedures (including critical care time)  Medications  ibuprofen (ADVIL,MOTRIN) tablet 800 mg (800 mg Oral Given 03/11/13 1257)  cyclobenzaprine (FLEXERIL) tablet 5 mg (5 mg Oral Given 03/11/13 1257)  heparin per pharmacy  Pt's rhythm strips were reviewed off his monitor. He has variable PR intervals that appears to be a 3rd degree block with narrow QRS complexes.   12:28 Mickel Baas, [place] cardiology has reviewed his rhythm strips and agree it appears to be a 3rd degree block with high grade AV block. They will see patient in the ED.   Pt has been seen by cardiology and for now is refusing to have a pacemaker inserted.   15:30 Vascular Lab called, pt has DVT in his whole right leg vein system with ? Mobile clot in the femoral vein.   Pt started on anticoagulation, heparin per pharmacy. Pt given his test results and need for CT angio chest for possible PE with abnormal CXR but no fever, cough.   15:44 Dr Lamar Benes, [facility] will admit to tele, attending Dr Hinton Rao   Labs Review Results for orders placed during the hospital encounter of 03/11/13  CBC WITH DIFFERENTIAL      Result Value Range   WBC 5.1  4.0 - 10.5 K/uL   RBC 4.83  4.22 - 5.81 MIL/uL   Hemoglobin 14.9  13.0 - 17.0 g/dL   HCT 42.4  39.0 - 52.0 %   MCV 87.8  78.0 - 100.0 fL   MCH 30.8  26.0 - 34.0 pg   MCHC 35.1  30.0 - 36.0 g/dL   RDW 13.2  11.5 - 15.5 %   Platelets 201  150 - 400 K/uL   Neutrophils Relative % 59  43 - 77 %   Neutro Abs 3.0  1.7 - 7.7 K/uL   Lymphocytes Relative 30  12 - 46 %   Lymphs Abs 1.5  0.7 - 4.0 K/uL   Monocytes Relative 10  3 -  12 %   Monocytes Absolute 0.5  0.1 - 1.0 K/uL   Eosinophils Relative 1  0 - 5 %   Eosinophils Absolute 0.1  0.0 - 0.7 K/uL   Basophils Relative 0  0 - 1 %   Basophils Absolute 0.0  0.0 - 0.1 K/uL  COMPREHENSIVE METABOLIC PANEL      Result Value Range   Sodium 140  137 - 147 mEq/L   Potassium 4.5  3.7 - 5.3 mEq/L   Chloride 103  96 - 112 mEq/L   CO2 26  19 - 32 mEq/L   Glucose, Bld 106 (*) 70 - 99 mg/dL   BUN 15  6 - 23 mg/dL   Creatinine, Ser 1.04  0.50 - 1.35 mg/dL   Calcium 9.4  8.4 - 10.5 mg/dL   Total Protein 8.2  6.0 - 8.3 g/dL   Albumin 3.7  3.5 - 5.2 g/dL   AST 24  0 - 37 U/L   ALT 27  0 - 53 U/L   Alkaline Phosphatase 92  39 - 117 U/L   Total Bilirubin 0.3  0.3 - 1.2 mg/dL   GFR calc non Af Amer 75 (*) >90 mL/min   GFR calc Af Amer 87 (*) >90 mL/min  TROPONIN I      Result Value Range   Troponin I <0.30  <0.30 ng/mL  MAGNESIUM      Result Value Range   Magnesium 1.9  1.5 - 2.5 mg/dL   No results found.   Imaging Review Dg Chest 2 View  03/11/2013   CLINICAL DATA:  Bradycardia. Left lower extremity swelling. Prior history of pulmonary embolism.  EXAM: CHEST  2 VIEW  COMPARISON:  CT ANGIO CHEST dated 12/04/2005; DG CHEST 2 VIEW dated 12/04/2005  FINDINGS: Cardiac silhouette mildly enlarged but stable. Hilar and mediastinal contours otherwise unremarkable. Focal airspace opacity in the right lower lobe. Lungs otherwise clear. Bronchovascular markings normal. Pulmonary vascularity normal. No pneumothorax. No pleural effusions. Visualized bony thorax intact.  IMPRESSION: 1. Atelectasis versus pneumonia involving the right lower lobe. 2. Stable mild cardiomegaly without pulmonary edema.   Electronically Signed   By: Evangeline Dakin M.D.   On: 03/11/2013 12:47   Dg Cervical Spine Complete  03/11/2013   CLINICAL DATA:  Left-sided neck pain.  No known injuries.  EXAM: CERVICAL SPINE  4+ VIEWS  COMPARISON:  None.  FINDINGS: Reversal of the usual cervical lordosis centered at C4-5.  Anatomic posterior alignment. No visible fractures. Moderate to severe disc space narrowing and endplate hypertrophic changes at C4-5, C5-6, and C6-7, worst at C5-6. Ossification in the anterior annular fibers at C4-5. Normal prevertebral soft tissues. Facet joints intact. Severe bilateral foraminal stenoses suspected at C4-5, C5-6, and C6-7. No static evidence of instability.  IMPRESSION: 1. Reversal of the usual cervical lordosis which may reflect positioning and/or spasm. 2. No evidence of fracture or static signs of instability. 3. Multilevel degenerative disc disease and spondylosis, worst at C5-6, with multilevel foraminal stenoses.   Electronically Signed   By: Evangeline Dakin M.D.   On: 03/11/2013 12:50    CT angio chest pending  EKG Interpretation   None       MDM   1. DVT of lower extremity (deep venous thrombosis)   2. Third degree heart block   3. Musculoskeletal neck pain     Plan admission  Rolland Porter, MD, Alanson Aly, MD 03/11/13 [phone]

## 2013-03-11 NOTE — ED Notes (Signed)
Admitting phy at bedside  

## 2013-03-11 NOTE — ED Notes (Signed)
MD at bedside. Cardiology Consult.

## 2013-03-11 NOTE — ED Notes (Signed)
PATIENT WAS GIVEN NITRO, ASPIRIN, AND O2  WIFE WAS NOTIFIED AND CALLED INTO ROOM SO PROVIDER CAN SPEAK WITH HER ABOUT PATIENT'S CONDITION

## 2013-03-11 NOTE — ED Notes (Signed)
Patient transported to X-ray 

## 2013-03-11 NOTE — ED Notes (Signed)
Verified heparin with 2nd RN

## 2013-03-11 NOTE — ED Provider Notes (Signed)
Ryshawn Sanzone is a 63 y.o. male who presents to Urgent Care today for left leg swelling and pain. This is been present for several days. He denies any significant shortness of breath chest pain or palpitations. He denies any weakness dizziness or fatigue. He has a pertinent history of a pulmonary embolism secondary to a DVT following surgery on his right leg. This was a long time ago and he is not currently taking any medications. Additionally he notes mild left neck pain. This started today. He notes a tightness in his left trapezius. He denies any exertional pain or chest pain.   History reviewed. No pertinent past medical history. History  Substance Use Topics  . Smoking status: Not on file  . Smokeless tobacco: Not on file  . Alcohol Use: Not on file   ROS as above Medications: Current Facility-Administered Medications  Medication Dose Route Frequency Provider Last Rate Last Dose  . 0.9 %  sodium chloride infusion   Intravenous Once Gregor Hams, MD       No current outpatient prescriptions on file.    Exam:  BP 185/72  Pulse 44  Temp(Src) 98.2 F (36.8 C) (Oral)  Resp 20  SpO2 100% Gen: Well NAD HEENT: EOMI,  MMM Lungs: Normal work of breathing. CTABL Heart: Bradycardia irregular no MRG Abd: NABS, Soft. NT, ND Exts: Brisk capillary refill, warm and well perfused.  Left lower extremity swelling and edema right is normal. Neck: Mild left neck trapezius tenderness  Twelve-lead EKG: Sinus bradycardia with AV disassociation and junctional bradycardia. Ventricular rate is around 40 beats per minute. Inverted lateral precordial T wave. No ST segment elevation or depression. No Q waves. No prior EKG   Assessment and Plan: 63 y.o. male with left leg swelling and significant EKG abnormality. This is concerning for pulmonary embolism with heart strain versus and NSTEMI.  Plan to provide aspirin, nitroglycerin, IV fluids. Transfer to the emergency room via [facility] for further  evaluation and management.  Discussed warning signs or symptoms. Please see discharge instructions. Patient expresses understanding.    Gregor Hams, MD 03/11/13 [phone]

## 2013-03-11 NOTE — Consult Note (Signed)
ELECTROPHYSIOLOGY CONSULT NOTE   Patient ID: Philip Chavez MRN: [id number], DOB/AGE: November 02, 1950   Admit date: 03/11/2013 Date of Consult: 03/11/2013  Primary Physician: Pcp Not In System Primary Cardiologist: New to [facility] Reason for Consultation: Complete heart block  History of Present Illness Philip Chavez is a 63 y.o. male with no prior cardiac history who presents to the ED with concerns regarding left leg swelling. 12-lead ECG in ED showed complete heart block; therefore, EP has been asked to evaluate. Philip Chavez denies any history of CAD/MI, valvular heart disease or CHF. He denies history of cardiac arrhythmias. He denies any chronic medical conditions and takes no medications on a regular basis. He only takes Tramadol and Aleve as needed for right ankle pain from previous injury. He denies CP or SOB. He denies palpitations. He denies dizziness, near syncope or syncope. He reports some fatigue over the last 2 months but states, "I'm retired and I just don't really want to do any work." He states he is able to climb 14 stairs in his home and uphill without difficulty. He denies recent illness, fever or chills. He denies thyroid dysfunction. He has noticed swelling in the left lower leg x 5 days which prompted him to come into the ED. He denies redness, warmth or pain. He is able to ambulate without difficulty.    Past Medical History None   Past Surgical History None   Allergies/Intolerances No Known Allergies  Current Home Medications      naproxen sodium 220 MG tablet  Commonly known as:  ANAPROX  Take 440 mg by mouth daily as needed (for pain).     traMADol 50 MG tablet  Commonly known as:  ULTRAM  Take 50 mg by mouth daily as needed for moderate pain.     Family History Negative for CAD. Positive for "irregular heartbeat" in both his father and sister, both of whom have pacemakers.   Social History Married. Retired in Sept 2014. Denies tobacco or alcohol use.  Denies illicit drug use.  Review of Systems General: No chills, fever, night sweats or weight changes  Cardiovascular:  No chest pain, dyspnea on exertion, orthopnea, palpitations, paroxysmal nocturnal dyspnea Dermatological: No rash, lesions or masses Respiratory: No cough, dyspnea Urologic: No hematuria, dysuria Abdominal: No nausea, vomiting, diarrhea, bright red blood per rectum, melena, or hematemesis Neurologic: No visual changes, weakness, changes in mental status All other systems reviewed and are otherwise negative except as noted above.  Physical Exam Vitals: Blood pressure 153/78, pulse 39, temperature 97.7 F (36.5 C), temperature source Oral, resp. rate 17, SpO2 100.00%.  General: Well developed, well appearing 63 y.o. male in no acute distress. HEENT: Normocephalic, atraumatic. EOMs intact. Sclera nonicteric. Oropharynx clear.  Neck: Supple. No JVD. Lungs: Respirations regular and unlabored, CTA bilaterally. No wheezes, rales or rhonchi. Heart: Bradycardic. S1, S2 present. No murmurs, rub, S3 or S4. Abdomen: Soft, non-tender, non-distended. BS present x 4 quadrants. No hepatosplenomegaly.  Extremities: No clubbing or cyanosis. No edema in RLE. 1-2+ edema LLE. Left leg is larger than the right. No erythema, warmth or tenderness. Psych: Normal affect. Neuro: Alert and oriented X 3. Moves all extremities spontaneously. Musculoskeletal: No kyphosis. Skin: Intact. Warm and dry. No rashes or petechiae in exposed areas.   Labs  Recent Labs  03/11/13 1148  TROPONINI <0.30   Lab Results  Component Value Date   WBC 5.1 03/11/2013   HGB 14.9 03/11/2013   HCT 42.4 03/11/2013   MCV 87.8 03/11/2013  PLT 201 03/11/2013    Recent Labs Lab 03/11/13 1148  NA 140  K 4.5  CL 103  CO2 26  BUN 15  CREATININE 1.04  CALCIUM 9.4  PROT 8.2  BILITOT 0.3  ALKPHOS 92  ALT 27  AST 24  GLUCOSE 106*    Radiology/Studies Dg Chest 2 View 03/11/2013    FINDINGS: Cardiac silhouette  mildly enlarged but stable. Hilar and mediastinal contours otherwise unremarkable. Focal airspace opacity in the right lower lobe. Lungs otherwise clear. Bronchovascular markings normal. Pulmonary vascularity normal. No pneumothorax. No pleural effusions. Visualized bony thorax intact.   IMPRESSION: 1. Atelectasis versus pneumonia involving the right lower lobe. 2. Stable mild cardiomegaly without pulmonary edema.    Electronically Signed   By: Evangeline Dakin M.D.   On: 03/11/2013 12:47   12-lead ECG today - complete heart block with junctional escape at 40 bpm Telemetry - complete heart block  Assessment and Plan Complete heart block  Philip Chavez presents to ED for evaluation of LLE swelling. His LLE Doppler US is pending. While here he has complete heart block documented by ECG and telemetry. He reports fatigue over the last 2 months or so but otherwise denies symptoms of CP, SOB, dizziness or syncope. He is not taking any AV nodal blockers or rate slowing medications. There is no reversible cause identified. He meets criteria for dual chamber PPM implantation. This was discussed in detail with Philip Chavez and his wife. He tells me he is not sure he wants a pacemaker but is willing to stay in the ED for now to speak with Dr. Lovena Le.    Signed, Ileene Hutchinson, PA-C 03/11/2013, 2:04 PM  EP Attending  Patient seen and examined. Since the patient was seen earlier today by Ileene Hutchinson, PA-C, he has been diagnosed with a left leg DVT and bilateral pulmonary emboli. He does have CHB. I do not have a unifying diagnosis. He will need a novel anti-coagulant. He will need PPM as he has no reversible causes for his heart block. He has family members with PPM, presumably due to heart block as well. Will obtain a 2D echo prior to PPM.   [name].D.

## 2013-03-11 NOTE — ED Notes (Signed)
C/o left leg pain States pain is radiating towards his neck Denies any injury Leg is swollen

## 2013-03-12 ENCOUNTER — Encounter (HOSPITAL_COMMUNITY)
Admission: EM | Disposition: A | Discharge status: Home / Self Care | Payer: Self-pay | Source: Home / Self Care | Attending: Family Medicine

## 2013-03-12 DIAGNOSIS — Z86711 Personal history of pulmonary embolism: Secondary | ICD-10-CM

## 2013-03-12 DIAGNOSIS — I82409 Acute embolism and thrombosis of unspecified deep veins of unspecified lower extremity: Secondary | ICD-10-CM

## 2013-03-12 DIAGNOSIS — I359 Nonrheumatic aortic valve disorder, unspecified: Secondary | ICD-10-CM

## 2013-03-12 DIAGNOSIS — I442 Atrioventricular block, complete: Secondary | ICD-10-CM

## 2013-03-12 LAB — TSH: TSH: 0.746 u[IU]/mL (ref 0.350–4.500)

## 2013-03-12 LAB — TROPONIN I: Troponin I: <0.3 ng/mL (ref <0.30)

## 2013-03-12 LAB — PRO B NATRIURETIC PEPTIDE: Pro B Natriuretic peptide (BNP): 398 pg/mL — ABNORMAL HIGH (ref 0–125)

## 2013-03-12 LAB — CBC
HCT: 39.9 % (ref 39.0–52.0)
HEMOGLOBIN: 13.8 g/dL (ref 13.0–17.0)
MCH: 30.3 pg (ref 26.0–34.0)
MCHC: 34.6 g/dL (ref 30.0–36.0)
MCV: 87.7 fL (ref 78.0–100.0)
Platelets: 196 10*3/uL (ref 150–400)
RBC: 4.55 MIL/uL (ref 4.22–5.81)
RDW: 13.3 % (ref 11.5–15.5)
WBC: 5.3 10*3/uL (ref 4.0–10.5)

## 2013-03-12 SURGERY — PERMANENT PACEMAKER INSERTION
Anesthesia: LOCAL

## 2013-03-12 NOTE — Progress Notes (Signed)
FMTS Attending Admission Note: Annabell Sabal MD Personal pager:  [phone] FPTS Service Pager:  [phone]  I  have seen and examined this patient, reviewed their chart. I have discussed this patient with the resident. I agree with the resident's findings, assessment and care plan.  Additionally:  Briefly, 63 yo M with prior DVT after surgery presents with 5 day history unilateral leg edema.  No shortness of breath or chest pain outpatient.  Found to have DVT, PE, and in complete heart block.  Started on Xarelto.  Declines pacemaker.  Strong family history of pacemaker requirements.  On my exam today, he is doing well, in NAD.  GOod air movement, no chest pain.  Leg with +3 pitting edema on left, RLE WNL.  Plan to continue Xarelto and watch his rhythm over the weekend.  Cardiology already on board.  Suspect he may eventually need pacemaker.    Alveda Reasons, MD 03/12/2013 4:36 PM \

## 2013-03-12 NOTE — Progress Notes (Signed)
Family Medicine Teaching Service Daily Progress Note Intern Pager: [phone]  Patient name: Philip Chavez Medical record number: [id number] Date of birth: Aug 19, 1950 Age: 63 y.o. Gender: male  Primary Care Provider: Pcp Not In System Consultants: Cardiology Code Status: Full code  Pt Overview and Major Events to Date: Philip Chavez is a 63 year old gentleman admitted to the cardiac stepdown unit with left LE DVT, bilateral moderate sized PE, and third degree heart block. The patient is currently asymptomatic reporting no difficulty breathing, no chest pain, and no SOB. He was transitioned from heparin drip to rivaroxiban. Cardiology is pursuing pacemaker placement, however the patient reports he would prefer not to have one if his arrythmia will resolve spontaneously.   Assessment and Plan: Philip Chavez is a 63 y.o. male presenting with 5 day history of left swollen leg and found to have DVT extending from posterior tibial vein to common femoral vein & bilateral PE with RLL infarct, also new diagnosis of 3rd degree AV block. 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  # LLE DVT & Bilateral PE: ultrasound confirmed DVT of left leg in posterior tibial, popliteal, femoral, common femoral veins; CFV DVT appears to be mobile. Denies SOB or CP, but CT angio PE shows bilateral lower lobe acute PE (moderate burden) and small RLL pulmonary infarct. CXR showed atelectasis vs infiltrate of RLL, however given CT this is likely the infarct (no WBC, no fever). On Xarelto. Lab workup otherwise normal, no leukocytosis or anemia, trop neg x 4. Has previous workup for hypercoagulability in 2008 showing negative FVL, prothrombin gene mutation, antithrombin III level normal.  - Xarelto 15mg  BID x21 days, then 20mg  QD - Given second VTE event will likely need lifelong anticoagulation.  # Complete AV block: asymptomatic at this time, no syncopal episodes. Bradycardia to 40s. Seen by electrophysiology and pt  reports he would prefer not to have a pacemaker. Per cardiology, pt will remain in the hospital over the weekend to assess for possible resolution of AV block in setting of PE. If it does not resolve spontaneously he will likely have pacemaker placed early next week. ECHO shows EF 50-55% with mild LVH and no wall motion abnormality. TSH is normal.  - telemetry, pacer pads in place  - check pro-BNP - Consider hydralazine for blood pressure control if continually elevated; will continue to monitor closely.  No intervention currently.  - Appreciate cardiology rec's in management of this patient   # Neck pain: present on admission and likely musculoskeletal in nature, very low suspicion for concerning infection like meningitis. Pt reports this is much improved this morning.  - resolved  FEN/GI: heart healthy diet, saline lock iv  Prophylaxis: Xarelto for DVT/PE treatment  Disposition: stepdown unit  Subjective: Pt reports he feels well this morning. Denies chest pain, SOB, difficulty breathing, feeling lightheaded or dizzy. No events overnight.  Objective: Temp:  [97.1 F (36.2 C)-98.4 F (36.9 C)] 98.2 F (36.8 C) (02/06 1114) Pulse Rate:  [34-53] 45 (02/06 0900) Resp:  [12-29] 12 (02/06 0900) BP: (135-184)/(63-87) 155/69 mmHg (02/06 1114) SpO2:  [98 %-100 %] 100 % (02/06 1114) Weight:  [205 lb (92.987 kg)] 205 lb (92.987 kg) (02/05 1546) Physical Exam: General: well-appearing gentleman sitting up in hospital bed, conversant, alert and oriented, in NAD Cardiovascular: Bradycardic, no murmurs, rubs, or gallops appreciated Respiratory: clear to auscultation bilaterally, no wheezes or crackles, good air movement throughout Abdomen: abdomen soft, non tender, non distended, no rebound or guarding, +bs Extremities:  Left lower extremity markedly enlarged below calf compared to right with 1+ pitting edema. Right LE without edema. Upper extremities without edema or rashes  bilaterally  Laboratory:  Recent Labs Lab 03/11/13 1148 03/12/13 0222  WBC 5.1 5.3  HGB 14.9 13.8  HCT 42.4 39.9  PLT 201 196    Recent Labs Lab 03/11/13 1148  NA 140  K 4.5  CL 103  CO2 26  BUN 15  CREATININE 1.04  CALCIUM 9.4  PROT 8.2  BILITOT 0.3  ALKPHOS 92  ALT 27  AST 24  GLUCOSE 106*    Imaging/Diagnostic Tests: ECHO: Study Conclusions - Left ventricle: The cavity size was mildly dilated. Wall thickness was increased in a pattern of mild LVH. Systolic function was normal. The estimated ejection fraction was in the range of 50% to 55%. Wall motion was normal; there were no regional wall motion abnormalities. Doppler parameters are consistent with high ventricular filling pressure. - Aortic valve: Mild regurgitation. - Right atrium: The atrium was mildly dilated   Philip Chavez, Med Student 03/12/2013, 12:01 PM MS4, [place] Intern pager: [phone], text pages welcome  I have seen the above patient and agree with the note; my addendum's are in blue.  Physical exam: Exam: General: well appearing gentleman in NAD. Cardiovascular: Bradycardic. Regular.  No murmurs, rubs, or gallops. Respiratory: CTAB. No rales, rhonchi, or wheeze. Abdomen: soft, nontender, nondistended. No palpable organomegaly. Extremities: 1+ LLE edema.   Skin: Warm, dry, intact. Neuro: No focal deficits.  Philip Chavez PGY-2

## 2013-03-12 NOTE — Progress Notes (Signed)
  Echocardiogram 2D Echocardiogram has been performed.  Lister Brizzi, [place] 03/12/2013, 10:15 AM

## 2013-03-12 NOTE — Care Management Note (Signed)
    Page 1 of 1   03/12/2013     10:09:37 AM   CARE MANAGEMENT NOTE 03/12/2013  Patient:  Philip Chavez, Philip Chavez   Account Number:  [id number]  Date Initiated:  03/12/2013  Documentation initiated by:  Elissa Hefty  Subjective/Objective Assessment:   adm w dvt     Action/Plan:   pt lives w wife,   Anticipated DC Date:     Anticipated DC Plan:  [place]  CM consult  Medication Assistance      Choice offered to / List presented to:             Status of service:   Medicare Important Message given?   (If response is "NO", the following Medicare IM given date fields will be blank) Date Medicare IM given:   Date Additional Medicare IM given:    Discharge Disposition:  HOME/SELF CARE  Per UR Regulation:  Reviewed for med. necessity/level of care/duration of stay  If discussed at [place] of Stay Meetings, dates discussed:    Comments:  2/6 1008a debbie Kyndle Schlender rn,bsn spoke w pt. he has 45.00 pe rmonth copay for xarelto. gave pt 30day free xarelto card and copay assist card that brings copay down to 5.00 per month.

## 2013-03-12 NOTE — Progress Notes (Signed)
SUBJECTIVE: The patient is doing well today.  At this time, he denies chest pain, shortness of breath, or any new concerns.  He reports a recent decrease in his energy level but no syncope/pre-syncope.    He remains in high grade heart block with ventricular rates in the high 30's  Echo ordered - not yet done.   CURRENT MEDICATIONS: . Rivaroxaban  15 mg Oral BID WC  . [START ON 04/02/2013] rivaroxaban  20 mg Oral Q supper  . sodium chloride  3 mL Intravenous Q12H      OBJECTIVE: Physical Exam: Filed Vitals:   03/12/13 0000 03/12/13 0100 03/12/13 0200 03/12/13 0400  BP: 154/68 138/65 154/63 148/76  Pulse: 39 39 39 42  Temp: 98.2 F (36.8 C)   98.4 F (36.9 C)  TempSrc: Oral   Oral  Resp: 14 12 18 20   Height:      Weight:      SpO2:    98%    Intake/Output Summary (Last 24 hours) at 03/12/13 1610 Last data filed at 03/11/13 1815  Gross per 24 hour  Intake      0 ml  Output    225 ml  Net   -225 ml    Telemetry reveals high grade heart block, ventricular rates high 30's  GEN- The patient is well appearing, alert and oriented x 3 today.   Head- normocephalic, atraumatic Eyes-  Sclera clear, conjunctiva pink Ears- hearing intact Oropharynx- clear Neck- supple, no JVP Lymph- no cervical lymphadenopathy Lungs- Clear to ausculation bilaterally, normal work of breathing Heart- bradycardic regular rhythm GI- soft, NT, ND, + BS Extremities- no clubbing, cyanosis, or edema Skin- no rash or lesion Psych- euthymic mood, full affect Neuro- strength and sensation are intact  LABS: Basic Metabolic Panel:  Recent Labs  03/11/13 1148  NA 140  K 4.5  CL 103  CO2 26  GLUCOSE 106*  BUN 15  CREATININE 1.04  CALCIUM 9.4  MG 1.9   Liver Function Tests:  Recent Labs  03/11/13 1148  AST 24  ALT 27  ALKPHOS 92  BILITOT 0.3  PROT 8.2  ALBUMIN 3.7   No results found for this basename: LIPASE, AMYLASE,  in the last 72 hours CBC:  Recent Labs  03/11/13 1148  03/12/13 0222  WBC 5.1 5.3  NEUTROABS 3.0  --   HGB 14.9 13.8  HCT 42.4 39.9  MCV 87.8 87.7  PLT 201 196   Cardiac Enzymes:  Recent Labs  03/11/13 1148 03/11/13 2215 03/12/13 0222  TROPONINI <0.30 <0.30 <0.30    RADIOLOGY: Dg Chest 2 View 03/11/2013   CLINICAL DATA:  Bradycardia. Left lower extremity swelling. Prior history of pulmonary embolism.  EXAM: CHEST  2 VIEW  COMPARISON:  CT ANGIO CHEST dated 12/04/2005; DG CHEST 2 VIEW dated 12/04/2005  FINDINGS: Cardiac silhouette mildly enlarged but stable. Hilar and mediastinal contours otherwise unremarkable. Focal airspace opacity in the right lower lobe. Lungs otherwise clear. Bronchovascular markings normal. Pulmonary vascularity normal. No pneumothorax. No pleural effusions. Visualized bony thorax intact.  IMPRESSION: 1. Atelectasis versus pneumonia involving the right lower lobe. 2. Stable mild cardiomegaly without pulmonary edema.   Electronically Signed   By: Evangeline Dakin M.D.   On: 03/11/2013 12:47   Dg Cervical Spine Complete 03/11/2013   CLINICAL DATA:  Left-sided neck pain.  No known injuries.  EXAM: CERVICAL SPINE  4+ VIEWS  COMPARISON:  None.  FINDINGS: Reversal of the usual cervical lordosis centered at C4-5. Anatomic  posterior alignment. No visible fractures. Moderate to severe disc space narrowing and endplate hypertrophic changes at C4-5, C5-6, and C6-7, worst at C5-6. Ossification in the anterior annular fibers at C4-5. Normal prevertebral soft tissues. Facet joints intact. Severe bilateral foraminal stenoses suspected at C4-5, C5-6, and C6-7. No static evidence of instability.  IMPRESSION: 1. Reversal of the usual cervical lordosis which may reflect positioning and/or spasm. 2. No evidence of fracture or static signs of instability. 3. Multilevel degenerative disc disease and spondylosis, worst at C5-6, with multilevel foraminal stenoses.   Electronically Signed   By: Evangeline Dakin M.D.   On: 03/11/2013 12:50   Ct Angio  Chest W/cm &/or Wo Cm 03/11/2013   ADDENDUM REPORT: 03/11/2013 17:37  ADDENDUM: Critical Value/emergent results were called by telephone at the time of interpretation on 03/11/2013 at 5:36 PM to Dr. Vanita Panda , who verbally acknowledged these results.   Electronically Signed   By: Suzy Bouchard M.D.   On: 03/11/2013 17:37  03/11/2013   CLINICAL DATA:  Short of breath, positive deep venous thrombosis  EXAM: CT ANGIOGRAPHY CHEST WITH CONTRAST  TECHNIQUE: Multidetector CT imaging of the chest was performed using the standard protocol during bolus administration of intravenous contrast. Multiplanar CT image reconstructions including MIPs were obtained to evaluate the vascular anatomy.  CONTRAST:  198mL OMNIPAQUE IOHEXOL 350 MG/ML SOLN  COMPARISON:  DG CHEST 2 VIEW dated 03/11/2013; CT ANGIO CHEST dated 12/04/2005  FINDINGS: There is a tubular filling defect within the proximal right lower lobe pulmonary artery consistent with acute pulmonary embolism. There is a small pulmonary infarction peripheral to this right lower lobe embolus (image 65, series 6. There is a filling defect within the proximal left lower lobe pulmonary artery (image 52, series 4. No evidence of right ventricular strain. Overall clot burden is moderate. The pattern of pulmonary emboli is similar to 12/03/2005 and slightly less severe.  No axillary or supraclavicular lymphadenopathy. No mediastinal lymphadenopathy. No pericardial effusion. Esophagus is normal.  No evidence of pneumonia or pneumothorax. Right lower lobe pulmonary infarction.  Limited view of the upper abdomen is unremarkable. Limited view of the skeleton demonstrates no aggressive osseous lesion.  Review of the MIP images confirms the above findings.  IMPRESSION: 1. Bilateral lower lobe acute pulmonary emboli. Overall clot burden is moderate. 2. Small right lower lobe pulmonary infarction.  Electronically Signed: By: Suzy Bouchard M.D. On: 03/11/2013 17:22    ASSESSMENT AND PLAN:    Active Problems:   DVT (deep venous thrombosis)   DVT (deep vein thrombosis) in pregnancy  1. Complete heart block Unclear etiology Escape rhythm appears stable and narrow.  This may be due changes in autonomic tone.  In the setting of acute PTE/DVT, I think it is prudent to wait over the weekend to see if his AV nodal conduction returns.  He would like to avoid PPM if possible.  If his AV nodal conduction improves, then perhaps we can avoid pacing.  Pacing carries increased risks with xarelto 15mg  BID as well. Echo is ordered to evaluate EF and also look for endocarditis/ any other potential causes for AV block.  Would keep in stepdown over the weekend.

## 2013-03-12 NOTE — H&P (Signed)
FMTS Attending Admission Note: Philip Sabal MD Personal pager:  [phone] FPTS Service Pager:  [phone]  I  have seen and examined this patient, reviewed their chart. I have discussed this patient with the resident. I agree with the resident's findings, assessment and care plan.  Additionally: See my progress note for additional details.    Philip Reasons, MD 03/12/2013 6:04 PM

## 2013-03-12 NOTE — Progress Notes (Signed)
Utilization Review Completed.Yanky Vanderburg T2/07/2013  

## 2013-03-13 ENCOUNTER — Encounter (HOSPITAL_COMMUNITY): Payer: Self-pay

## 2013-03-13 LAB — CBC
HCT: 40.3 % (ref 39.0–52.0)
HEMOGLOBIN: 14.2 g/dL (ref 13.0–17.0)
MCH: 30.7 pg (ref 26.0–34.0)
MCHC: 35.2 g/dL (ref 30.0–36.0)
MCV: 87 fL (ref 78.0–100.0)
Platelets: 212 10*3/uL (ref 150–400)
RBC: 4.63 MIL/uL (ref 4.22–5.81)
RDW: 13.4 % (ref 11.5–15.5)
WBC: 5.7 10*3/uL (ref 4.0–10.5)

## 2013-03-13 MED ORDER — TRAMADOL HCL 50 MG PO TABS
50.0000 mg | ORAL_TABLET | Freq: Every day | ORAL | Status: AC
  Administered 2013-03-13: 50 mg via ORAL
  Filled 2013-03-13: qty 1

## 2013-03-13 NOTE — Progress Notes (Signed)
SUBJECTIVE: The patient is doing well today.  At this time, he denies chest pain, shortness of breath, or any new concerns.   CURRENT MEDICATIONS: . Rivaroxaban  15 mg Oral BID WC  . [START ON 04/02/2013] rivaroxaban  20 mg Oral Q supper  . sodium chloride  3 mL Intravenous Q12H      OBJECTIVE: Physical Exam: Filed Vitals:   03/12/13 2327 03/13/13 0321 03/13/13 0400 03/13/13 0700  BP: 153/80 125/58  148/77  Pulse: 39  40   Temp: 98.4 F (36.9 C)  98.4 F (36.9 C) 98.4 F (36.9 C)  TempSrc: Oral  Oral Oral  Resp: 16     Height:      Weight:   207 lb 3.7 oz (94 kg)   SpO2: 97%  98% 99%    Intake/Output Summary (Last 24 hours) at 03/13/13 0727 Last data filed at 03/13/13 0500  Gross per 24 hour  Intake   1080 ml  Output    820 ml  Net    260 ml    Telemetry reveals sinus rhythm with persistent complete heart block, ventricular rates 40's  GEN- The patient is well appearing, alert and oriented x 3 today.   Neck- supple, 7 cm JVP Lungs- Clear to ausculation bilaterally, normal work of breathing Heart- Regular rate and rhythm, no murmurs, rubs or gallops, PMI not laterally displaced GI- soft, NT, ND, + BS Extremities- no clubbing, cyanosis, or edema Skin- no rash or lesion Neuro- strength and sensation are intact  LABS: Basic Metabolic Panel:  Recent Labs  03/11/13 1148  NA 140  K 4.5  CL 103  CO2 26  GLUCOSE 106*  BUN 15  CREATININE 1.04  CALCIUM 9.4  MG 1.9   Liver Function Tests:  Recent Labs  03/11/13 1148  AST 24  ALT 27  ALKPHOS 92  BILITOT 0.3  PROT 8.2  ALBUMIN 3.7   CBC:  Recent Labs  03/11/13 1148 03/12/13 0222 03/13/13 0246  WBC 5.1 5.3 5.7  NEUTROABS 3.0  --   --   HGB 14.9 13.8 14.2  HCT 42.4 39.9 40.3  MCV 87.8 87.7 87.0  PLT 201 196 212   Cardiac Enzymes:  Recent Labs  03/11/13 2215 03/12/13 0222 03/12/13 0944  TROPONINI <0.30 <0.30 <0.30   Thyroid Function Tests:  Recent Labs  03/11/13 2200  TSH 0.746      RADIOLOGY: Dg Chest 2 View 03/11/2013   CLINICAL DATA:  Bradycardia. Left lower extremity swelling. Prior history of pulmonary embolism.  EXAM: CHEST  2 VIEW  COMPARISON:  CT ANGIO CHEST dated 12/04/2005; DG CHEST 2 VIEW dated 12/04/2005  FINDINGS: Cardiac silhouette mildly enlarged but stable. Hilar and mediastinal contours otherwise unremarkable. Focal airspace opacity in the right lower lobe. Lungs otherwise clear. Bronchovascular markings normal. Pulmonary vascularity normal. No pneumothorax. No pleural effusions. Visualized bony thorax intact.  IMPRESSION: 1. Atelectasis versus pneumonia involving the right lower lobe. 2. Stable mild cardiomegaly without pulmonary edema.   Electronically Signed   By: Evangeline Dakin M.D.   On: 03/11/2013 12:47   ASSESSMENT AND PLAN:  Active Problems:   DVT (deep venous thrombosis)   Embolism, pulmonary with infarction   Third degree heart block Rec: The patient feels well. His leg remains swollen. He notes that 4 days before presenting, he worked for over 8 hours in an unusual position with his legs bent and wonders if this extreme period where venous return impaired resulted in his blood clot. He  is still reflecting on whether to have a PPM placed. I have reminded him about the survival advantage of PPM in patient's with CHB. He is reflecting. Will watch today in step down because of the large clot burden in his leg and lungs while Xarelto becoming therapeutic.  [name].D.

## 2013-03-13 NOTE — Progress Notes (Signed)
Seen and examined.  Discussed with Dr. Lacinda Axon.  Agree with his management and documentation.  Patient has two important problems. Bilateral PE under treatment.  No dyspnea and not on O2.  On anticoag.  3rd degree heart block.  Cards following.  We all hope this resolves spontaneously.  If not, will need pacer.  Decision to be made Monday 2/9.

## 2013-03-13 NOTE — Progress Notes (Signed)
Family Medicine Teaching Service Daily Progress Note Intern Pager: [phone]  Patient name: Philip Chavez Medical record number: [id number] Date of birth: Sep 19, 1950 Age: 63 y.o. Gender: male  Primary Care Provider: Pcp Not In System Consultants: Cardiology Code Status: Full code  Pt Overview and Major Events to Date:  2/5 - Admitted after being evaluated at [facility] for LLE swelling and pain. 2/5 - CT angio revealed bilateral PE's with RLL infarction (small); LE dopper revealed deep vein thrombosis involving the left common femoral vein, left profunda femoris vein,left femoral vein, left popliteal vein, and left posterial tibial vein. Patient also noted be in 3rd degree heart block.  Started on Heparin gtt initially then placed on Xarelto 2/6 - Cardiology recommending staying over the weekend 2/7 - Remains in 3rd degree Heart Block   Assessment and Plan:  Philip Chavez is a 63 y.o. male presenting with 5 day history of left swollen leg and found to have DVT extending from posterior tibial vein to common femoral vein & bilateral PE with RLL infarct, also new diagnosis of 3rd degree AV block. 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  # LLE DVT & Bilateral PE: ultrasound confirmed DVT of left leg in posterior tibial, popliteal, femoral, common femoral veins; CFV DVT appears to be mobile. Denies SOB or CP, but CT angio PE shows bilateral lower lobe acute PE (moderate burden) and small RLL pulmonary infarct.On Xarelto. Lab workup otherwise normal, no leukocytosis or anemia, trop neg x 4. Has previous workup for hypercoagulability in 2008 showing negative FVL, prothrombin gene mutation, antithrombin III level normal.  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  # Complete AV block - Remains asymptomatic - Cardiology following; Patient to be monitor over the weekend.  If no improvement, will need  pacemaker.   # Neck pain - Resolved.  FEN/GI: heart healthy diet, saline lock iv  Prophylaxis: Xarelto for DVT/PE treatment  Disposition: Will remain in SDU over the weekend; Will most likely need pacemaker.   Subjective:  Feeling well. No chest pain, SOB.  Objective: Temp:  [97.8 F (36.6 C)-98.4 F (36.9 C)] 98.4 F (36.9 C) (02/07 0700) Pulse Rate:  [39-40] 40 (02/07 0400) Resp:  [16-18] 16 (02/06 2327) BP: (125-155)/(58-80) 148/77 mmHg (02/07 0700) SpO2:  [97 %-100 %] 99 % (02/07 0700) Weight:  [207 lb 3.7 oz (94 kg)] 207 lb 3.7 oz (94 kg) (02/07 0400) Physical Exam: General: well appearing gentleman in NAD.  Cardiovascular: Bradycardic. Regular. No murmurs, rubs, or gallops.  Respiratory: CTAB. No rales, rhonchi, or wheeze.  Abdomen: soft, nontender, nondistended. No palpable organomegaly.  Extremities: 1+ LLE edema. Significant swelling of the LLE. Skin: Warm, dry, intact.  Neuro: No focal deficits.  Laboratory:  Recent Labs Lab 03/11/13 1148 03/12/13 0222 03/13/13 0246  WBC 5.1 5.3 5.7  HGB 14.9 13.8 14.2  HCT 42.4 39.9 40.3  PLT 201 196 212    Recent Labs Lab 03/11/13 1148  NA 140  K 4.5  CL 103  CO2 26  BUN 15  CREATININE 1.04  CALCIUM 9.4  PROT 8.2  BILITOT 0.3  ALKPHOS 92  ALT 27  AST 24  GLUCOSE 106*    Imaging/Diagnostic Tests: ECHO: Study Conclusions - Left ventricle: The cavity size was mildly dilated. Wall thickness was increased in a pattern of mild LVH. Systolic function was normal. The estimated ejection fraction was in the range of 50% to 55%.  Wall motion was normal; there were no regional wall motion abnormalities. Doppler parameters are consistent with high ventricular filling pressure. - Aortic valve: Mild regurgitation. - Right atrium: The atrium was mildly dilated  [place] PGY-2

## 2013-03-14 DIAGNOSIS — I2699 Other pulmonary embolism without acute cor pulmonale: Secondary | ICD-10-CM

## 2013-03-14 LAB — CBC
HEMATOCRIT: 41.4 % (ref 39.0–52.0)
Hemoglobin: 14.5 g/dL (ref 13.0–17.0)
MCH: 30.9 pg (ref 26.0–34.0)
MCHC: 35 g/dL (ref 30.0–36.0)
MCV: 88.1 fL (ref 78.0–100.0)
Platelets: 221 10*3/uL (ref 150–400)
RBC: 4.7 MIL/uL (ref 4.22–5.81)
RDW: 13.4 % (ref 11.5–15.5)
WBC: 6.8 10*3/uL (ref 4.0–10.5)

## 2013-03-14 MED ORDER — MORPHINE SULFATE 2 MG/ML IJ SOLN
1.0000 mg | Freq: Once | INTRAMUSCULAR | Status: AC
Start: 2013-03-14 — End: 2013-03-14
  Administered 2013-03-14: 1 mg via INTRAVENOUS
  Filled 2013-03-14: qty 1

## 2013-03-14 MED ORDER — OXYCODONE HCL 5 MG PO TABS
5.0000 mg | ORAL_TABLET | ORAL | Status: DC | PRN
  Administered 2013-03-15 (×2): 5 mg via ORAL
  Filled 2013-03-14 (×2): qty 1

## 2013-03-14 MED ORDER — TRAMADOL HCL 50 MG PO TABS
50.0000 mg | ORAL_TABLET | Freq: Four times a day (QID) | ORAL | Status: AC
  Administered 2013-03-14: 50 mg via ORAL
  Filled 2013-03-14: qty 1

## 2013-03-14 MED ORDER — MORPHINE SULFATE 2 MG/ML IJ SOLN
1.0000 mg | Freq: Once | INTRAMUSCULAR | Status: AC
  Administered 2013-03-14: 1 mg via INTRAVENOUS
  Filled 2013-03-14: qty 1

## 2013-03-14 NOTE — Progress Notes (Signed)
Patient ID: Philip Chavez, male   DOB: 1950-03-06, 63 y.o.   MRN: [id number]   SUBJECTIVE: Patient remains in CHB with narrow ventricular escape.  HR in 40.  No complaints.   . Rivaroxaban  15 mg Oral BID WC  . [START ON 04/02/2013] rivaroxaban  20 mg Oral Q supper  . sodium chloride  3 mL Intravenous Q12H      Filed Vitals:   03/14/13 0358 03/14/13 0401 03/14/13 0827 03/14/13 0830  BP:  151/58 133/69 133/69  Pulse:   40   Temp: 97.5 F (36.4 C)  98.3 F (36.8 C)   TempSrc: Oral  Oral   Resp:   18   Height:      Weight:      SpO2: 95%  97%     Intake/Output Summary (Last 24 hours) at 03/14/13 0909 Last data filed at 03/14/13 0400  Gross per 24 hour  Intake    600 ml  Output      0 ml  Net    600 ml    LABS: Basic Metabolic Panel:  Recent Labs  03/11/13 1148  NA 140  K 4.5  CL 103  CO2 26  GLUCOSE 106*  BUN 15  CREATININE 1.04  CALCIUM 9.4  MG 1.9   Liver Function Tests:  Recent Labs  03/11/13 1148  AST 24  ALT 27  ALKPHOS 92  BILITOT 0.3  PROT 8.2  ALBUMIN 3.7   No results found for this basename: LIPASE, AMYLASE,  in the last 72 hours CBC:  Recent Labs  03/11/13 1148  03/13/13 0246 03/14/13 0312  WBC 5.1  < > 5.7 6.8  NEUTROABS 3.0  --   --   --   HGB 14.9  < > 14.2 14.5  HCT 42.4  < > 40.3 41.4  MCV 87.8  < > 87.0 88.1  PLT 201  < > 212 221  < > = values in this interval not displayed. Cardiac Enzymes:  Recent Labs  03/11/13 2215 03/12/13 0222 03/12/13 0944  TROPONINI <0.30 <0.30 <0.30   BNP: No components found with this basename: POCBNP,  D-Dimer: No results found for this basename: DDIMER,  in the last 72 hours Hemoglobin A1C: No results found for this basename: HGBA1C,  in the last 72 hours Fasting Lipid Panel: No results found for this basename: CHOL, HDL, LDLCALC, TRIG, CHOLHDL, LDLDIRECT,  in the last 72 hours Thyroid Function Tests:  Recent Labs  03/11/13 2200  TSH 0.746   Anemia Panel: No results found for  this basename: VITAMINB12, FOLATE, FERRITIN, TIBC, IRON, RETICCTPCT,  in the last 72 hours  RADIOLOGY: Dg Chest 2 View  03/11/2013   CLINICAL DATA:  Bradycardia. Left lower extremity swelling. Prior history of pulmonary embolism.  EXAM: CHEST  2 VIEW  COMPARISON:  CT ANGIO CHEST dated 12/04/2005; DG CHEST 2 VIEW dated 12/04/2005  FINDINGS: Cardiac silhouette mildly enlarged but stable. Hilar and mediastinal contours otherwise unremarkable. Focal airspace opacity in the right lower lobe. Lungs otherwise clear. Bronchovascular markings normal. Pulmonary vascularity normal. No pneumothorax. No pleural effusions. Visualized bony thorax intact.  IMPRESSION: 1. Atelectasis versus pneumonia involving the right lower lobe. 2. Stable mild cardiomegaly without pulmonary edema.   Electronically Signed   By: Evangeline Dakin M.D.   On: 03/11/2013 12:47   Dg Cervical Spine Complete  03/11/2013   CLINICAL DATA:  Left-sided neck pain.  No known injuries.  EXAM: CERVICAL SPINE  4+ VIEWS  COMPARISON:  None.  FINDINGS: Reversal of the usual cervical lordosis centered at C4-5. Anatomic posterior alignment. No visible fractures. Moderate to severe disc space narrowing and endplate hypertrophic changes at C4-5, C5-6, and C6-7, worst at C5-6. Ossification in the anterior annular fibers at C4-5. Normal prevertebral soft tissues. Facet joints intact. Severe bilateral foraminal stenoses suspected at C4-5, C5-6, and C6-7. No static evidence of instability.  IMPRESSION: 1. Reversal of the usual cervical lordosis which may reflect positioning and/or spasm. 2. No evidence of fracture or static signs of instability. 3. Multilevel degenerative disc disease and spondylosis, worst at C5-6, with multilevel foraminal stenoses.   Electronically Signed   By: Evangeline Dakin M.D.   On: 03/11/2013 12:50   Ct Angio Chest W/cm &/or Wo Cm  03/11/2013   ADDENDUM REPORT: 03/11/2013 17:37  ADDENDUM: Critical Value/emergent results were called by  telephone at the time of interpretation on 03/11/2013 at 5:36 PM to Dr. Vanita Panda , who verbally acknowledged these results.   Electronically Signed   By: Suzy Bouchard M.D.   On: 03/11/2013 17:37   03/11/2013   CLINICAL DATA:  Short of breath, positive deep venous thrombosis  EXAM: CT ANGIOGRAPHY CHEST WITH CONTRAST  TECHNIQUE: Multidetector CT imaging of the chest was performed using the standard protocol during bolus administration of intravenous contrast. Multiplanar CT image reconstructions including MIPs were obtained to evaluate the vascular anatomy.  CONTRAST:  13mL OMNIPAQUE IOHEXOL 350 MG/ML SOLN  COMPARISON:  DG CHEST 2 VIEW dated 03/11/2013; CT ANGIO CHEST dated 12/04/2005  FINDINGS: There is a tubular filling defect within the proximal right lower lobe pulmonary artery consistent with acute pulmonary embolism. There is a small pulmonary infarction peripheral to this right lower lobe embolus (image 65, series 6. There is a filling defect within the proximal left lower lobe pulmonary artery (image 52, series 4. No evidence of right ventricular strain. Overall clot burden is moderate. The pattern of pulmonary emboli is similar to 12/03/2005 and slightly less severe.  No axillary or supraclavicular lymphadenopathy. No mediastinal lymphadenopathy. No pericardial effusion. Esophagus is normal.  No evidence of pneumonia or pneumothorax. Right lower lobe pulmonary infarction.  Limited view of the upper abdomen is unremarkable. Limited view of the skeleton demonstrates no aggressive osseous lesion.  Review of the MIP images confirms the above findings.  IMPRESSION: 1. Bilateral lower lobe acute pulmonary emboli. Overall clot burden is moderate. 2. Small right lower lobe pulmonary infarction.  Electronically Signed: By: Suzy Bouchard M.D. On: 03/11/2013 17:22    PHYSICAL EXAM General: NAD Neck: No JVD, no thyromegaly or thyroid nodule.  Lungs: Clear to auscultation bilaterally with normal respiratory  effort. CV: Nondisplaced PMI.  Heart regular S1/S2, no S3/S4, no murmur.  1+ edema left ankle.  No carotid bruit.  Normal pedal pulses.  Abdomen: Soft, nontender, no hepatosplenomegaly, no distention.  Neurologic: Alert and oriented x 3.  Psych: Normal affect. Extremities: No clubbing or cyanosis.   TELEMETRY: Reviewed telemetry pt in CHB, rate 40s with narrow ventricular escape  ASSESSMENT AND PLAN: 63 yo with PE/DVT (acute) and CHB with narrow ventricular escape.  1. Complete heart block: He is being followed by EP.  There is a narrow ventricular escape, HR in 40s. Rhythm unchanged so far this weekend.  Etiology uncertain: No evidence for endocarditis on echo done 2/6.  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  Wrong season but for completeness send Lyme titer.  Suspect he may need a PCM as rhythm unchanged => will keep NPO after midnight, EP  to followup on Monday.  2. PE/DVT: On Xarelto.   Loralie Champagne 03/14/2013 9:12 AM

## 2013-03-14 NOTE — Progress Notes (Signed)
Family Medicine Teaching Service Daily Progress Note Intern Pager: [phone]  Patient name: Philip Chavez Medical record number: [id number] Date of birth: 1950/10/07 Age: 63 y.o. Gender: male  Primary Care Provider: Pcp Not In System Consultants: Cardiology Code Status: Full code  Pt Overview and Major Events to Date:  2/5 - Admitted after being evaluated at [facility] for LLE swelling and pain. 2/5 - CT angio revealed bilateral PE's with RLL infarction (small); LE dopper revealed deep vein thrombosis involving the left common femoral vein, left profunda femoris vein,left femoral vein, left popliteal vein, and left posterial tibial vein. Patient also noted be in 3rd degree heart block.  Started on Heparin gtt initially then placed on Xarelto 2/6 - Cardiology recommending staying over the weekend 2/7-2/8 - Remains in 3rd degree Heart Block, c/o back pain  Assessment and Plan:  Philip Chavez is a 64 y.o. male presenting with 5 day history of left swollen leg and found to have DVT extending from posterior tibial vein to common femoral vein & bilateral PE with RLL infarct, also new diagnosis of 3rd degree AV block. 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  # LLE DVT & Bilateral PE: ultrasound confirmed DVT of left leg in posterior tibial, popliteal, femoral, common femoral veins; CFV DVT appears to be mobile. Denies SOB or CP, but CT angio PE shows bilateral lower lobe acute PE (moderate burden) and small RLL pulmonary infarct.On Xarelto. Lab workup otherwise normal, no leukocytosis or anemia, trop neg x 4. Has previous workup for hypercoagulability in 2008 showing negative FVL, prothrombin gene mutation, antithrombin III level normal.  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 - Back pain, Likely portion of chronic pain but i suspect some contribution from PE, schedule tramadol and PRN oxycodone  #  Complete AV block - Remains asymptomatic - Cardiology following; Patient to be monitor over the weekend.  If no improvement, will need pacemaker.   - Will send lyme titer  # Neck pain - Resolved.  FEN/GI: heart healthy diet, saline lock iv  Prophylaxis: Xarelto for DVT/PE treatment  Disposition: Will remain in SDU over the weekend; Will most likely need pacemaker.   Subjective:  Feeling much better today, complaining of low back pain that is localized to lumbar area and tender in lower thoracic area as well.   Objective: Temp:  [97.5 F (36.4 C)-98.8 F (37.1 C)] 98.3 F (36.8 C) (02/08 0827) Chavez Rate:  [40] 40 (02/08 0827) Resp:  [18] 18 (02/08 0827) BP: (103-161)/(58-83) 133/69 mmHg (02/08 0827) SpO2:  [94 %-99 %] 97 % (02/08 0827) Physical Exam: General: well appearing gentleman in NAD, walking to go take shower.  Cardiovascular: Bradycardic. Regular. No murmurs, rubs, or gallops.  Respiratory: CTAB. No rales, rhonchi, or wheeze.  Abdomen: soft, nontender, nondistended.  Extremities: trace to 1+ BL LE edema, L>R Skin: Warm, dry, intact.  Neuro: No focal deficits. Normal gait MSK: Tenderness to palpation of BL paraspinal muscles in lower thoracic area and lumbar spine.   Laboratory:  Recent Labs Lab 03/12/13 0222 03/13/13 0246 03/14/13 0312  WBC 5.3 5.7 6.8  HGB 13.8 14.2 14.5  HCT 39.9 40.3 41.4  PLT 196 212 221    Recent Labs Lab 03/11/13 1148  NA 140  K 4.5  CL 103  CO2 26  BUN 15  CREATININE 1.04  CALCIUM 9.4  PROT 8.2  BILITOT 0.3  ALKPHOS 92  ALT 27  AST 24  GLUCOSE 106*    Imaging/Diagnostic Tests: ECHO: Study Conclusions - Left ventricle: The cavity size was mildly dilated. Wall thickness was increased in a pattern of mild LVH. Systolic function was normal. The estimated ejection fraction was in the range of 50% to 55%. Wall motion was normal; there were no regional wall motion abnormalities. Doppler parameters are consistent with  high ventricular filling pressure. - Aortic valve: Mild regurgitation. - Right atrium: The atrium was mildly dilated    Philip Apple, MD [place] Resident, PGY-2 03/14/2013, 8:40 AM Pager [phone]

## 2013-03-14 NOTE — Progress Notes (Signed)
Seen and examined.  Discussed with Dr. Wendi Snipes.  Agree with his management and documentation.  [name] is doing well from a PE standpoint.  No dyspnea, cough or O2 requirement.  Does have some back pain which could be related.   Still in Complete HB.  EP to see tomorrow.

## 2013-03-14 NOTE — Progress Notes (Signed)
Complained of pain on the right lower flank, scale of 5/10. MD aware, with order, ultram given with relief. after few min.

## 2013-03-15 ENCOUNTER — Encounter (HOSPITAL_COMMUNITY)
Admission: EM | Disposition: A | Discharge status: Home / Self Care | Payer: Self-pay | Source: Home / Self Care | Attending: Family Medicine

## 2013-03-15 DIAGNOSIS — I442 Atrioventricular block, complete: Secondary | ICD-10-CM

## 2013-03-15 HISTORY — PX: PERMANENT PACEMAKER INSERTION: SHX5480

## 2013-03-15 LAB — CBC
HEMATOCRIT: 41.8 % (ref 39.0–52.0)
Hemoglobin: 14.4 g/dL (ref 13.0–17.0)
MCH: 30.5 pg (ref 26.0–34.0)
MCHC: 34.4 g/dL (ref 30.0–36.0)
MCV: 88.6 fL (ref 78.0–100.0)
Platelets: 224 10*3/uL (ref 150–400)
RBC: 4.72 MIL/uL (ref 4.22–5.81)
RDW: 13.3 % (ref 11.5–15.5)
WBC: 6.8 10*3/uL (ref 4.0–10.5)

## 2013-03-15 LAB — BASIC METABOLIC PANEL
BUN: 14 mg/dL (ref 6–23)
CALCIUM: 9.2 mg/dL (ref 8.4–10.5)
CO2: 25 meq/L (ref 19–32)
CREATININE: 1.14 mg/dL (ref 0.50–1.35)
Chloride: 99 mEq/L (ref 96–112)
GFR calc Af Amer: 78 mL/min — ABNORMAL LOW (ref >90)
GFR calc non Af Amer: 67 mL/min — ABNORMAL LOW (ref >90)
GLUCOSE: 97 mg/dL (ref 70–99)
Potassium: 4.1 mEq/L (ref 3.7–5.3)
Sodium: 137 mEq/L (ref 137–147)

## 2013-03-15 LAB — B. BURGDORFI ANTIBODIES: B burgdorferi Ab IgG+IgM: 0.63 {ISR}

## 2013-03-15 SURGERY — PERMANENT PACEMAKER INSERTION
Anesthesia: LOCAL

## 2013-03-15 MED ORDER — CEFAZOLIN SODIUM-DEXTROSE 2-3 GM-% IV SOLR
2.0000 g | INTRAVENOUS | Status: DC
  Filled 2013-03-15: qty 50

## 2013-03-15 MED ORDER — FENTANYL CITRATE 0.05 MG/ML IJ SOLN
INTRAMUSCULAR | Status: AC
  Filled 2013-03-15: qty 2

## 2013-03-15 MED ORDER — YOU HAVE A PACEMAKER BOOK
Freq: Once | Status: AC
  Administered 2013-03-15: 22:00:00
  Filled 2013-03-15: qty 1

## 2013-03-15 MED ORDER — RIVAROXABAN 15 MG PO TABS
15.0000 mg | ORAL_TABLET | Freq: Once | ORAL | Status: AC
  Administered 2013-03-16: 15 mg via ORAL
  Filled 2013-03-15: qty 1

## 2013-03-15 MED ORDER — CEFAZOLIN SODIUM-DEXTROSE 2-3 GM-% IV SOLR
2.0000 g | Freq: Four times a day (QID) | INTRAVENOUS | Status: AC
  Administered 2013-03-15 – 2013-03-16 (×3): 2 g via INTRAVENOUS
  Filled 2013-03-15 (×3): qty 50

## 2013-03-15 MED ORDER — SODIUM CHLORIDE 0.9 % IV SOLN
INTRAVENOUS | Status: DC
  Administered 2013-03-15: 12:00:00 via INTRAVENOUS

## 2013-03-15 MED ORDER — RIVAROXABAN 15 MG PO TABS
15.0000 mg | ORAL_TABLET | Freq: Two times a day (BID) | ORAL | Status: DC
  Administered 2013-03-16 – 2013-03-18 (×5): 15 mg via ORAL
  Filled 2013-03-15 (×7): qty 1

## 2013-03-15 MED ORDER — ONDANSETRON HCL 4 MG/2ML IJ SOLN: 4.0000 mg | Freq: Four times a day (QID) | INTRAMUSCULAR | Status: DC | PRN

## 2013-03-15 MED ORDER — CHLORHEXIDINE GLUCONATE 4 % EX LIQD
60.0000 mL | Freq: Once | CUTANEOUS | Status: AC
  Administered 2013-03-15: 4 via TOPICAL

## 2013-03-15 MED ORDER — SODIUM CHLORIDE 0.9 % IR SOLN
80.0000 mg | Status: DC
  Filled 2013-03-15: qty 2

## 2013-03-15 MED ORDER — SODIUM CHLORIDE 0.9 % IV SOLN
INTRAVENOUS | Status: DC
  Administered 2013-03-15: 20 mL/h via INTRAVENOUS

## 2013-03-15 MED ORDER — MIDAZOLAM HCL 5 MG/5ML IJ SOLN
INTRAMUSCULAR | Status: AC
  Filled 2013-03-15: qty 5

## 2013-03-15 MED ORDER — ACETAMINOPHEN 325 MG PO TABS: 325.0000 mg | ORAL_TABLET | ORAL | Status: DC | PRN

## 2013-03-15 MED ORDER — CHLORHEXIDINE GLUCONATE 4 % EX LIQD
60.0000 mL | Freq: Once | CUTANEOUS | Status: AC
  Administered 2013-03-15: 4 via TOPICAL
  Filled 2013-03-15: qty 60

## 2013-03-15 MED ORDER — HEPARIN (PORCINE) IN NACL 2-0.9 UNIT/ML-% IJ SOLN
INTRAMUSCULAR | Status: AC
  Filled 2013-03-15: qty 500

## 2013-03-15 MED ORDER — LIDOCAINE HCL (PF) 1 % IJ SOLN
INTRAMUSCULAR | Status: AC
  Filled 2013-03-15: qty 60

## 2013-03-15 MED ORDER — RIVAROXABAN 20 MG PO TABS: 20.0000 mg | ORAL_TABLET | Freq: Every day | ORAL | Status: DC

## 2013-03-15 NOTE — Progress Notes (Signed)
Patient: Philip Chavez Date of Encounter: 03/15/2013, 5:43 AM Admit date: 03/11/2013     Subjective  Philip Chavez has no complaints this AM.   Objective  Physical Exam: Vitals: BP 153/62  Pulse 46  Temp(Src) 98.1 F (36.7 C) (Oral)  Resp 17  Ht 6\' 1"  (1.854 m)  Wt 207 lb 3.7 oz (94 kg)  BMI 27.35 kg/m2  SpO2 100% General: Well developed, well appearing 63 year old male in no acute distress. Neck: Supple. JVD not elevated. Lungs: Clear bilaterally to auscultation without wheezes, rales, or rhonchi. Breathing is unlabored. Heart: S1 S2 without murmurs, rubs, or gallops.  Abdomen: Soft, non-distended. Extremities: No clubbing or cyanosis. 1+ LLE edema.   Neuro: Alert and oriented X 3. Moves all extremities spontaneously. No focal deficits.  Intake/Output:  Intake/Output Summary (Last 24 hours) at 03/15/13 0543 Last data filed at 03/14/13 2246  Gross per 24 hour  Intake    543 ml  Output      0 ml  Net    543 ml    Inpatient Medications:  . Rivaroxaban  15 mg Oral BID WC  . [START ON 04/02/2013] rivaroxaban  20 mg Oral Q supper  . sodium chloride  3 mL Intravenous Q12H    Labs:  Recent Labs  03/14/13 0312 03/15/13 0441  WBC 6.8 6.8  HGB 14.5 14.4  HCT 41.4 41.8  MCV 88.1 88.6  PLT 221 224      Component Value Date/Time   NA 137 03/15/2013 0441   K 4.1 03/15/2013 0441   CL 99 03/15/2013 0441   CO2 25 03/15/2013 0441   GLUCOSE 97 03/15/2013 0441   BUN 14 03/15/2013 0441   CREATININE 1.14 03/15/2013 0441   CALCIUM 9.2 03/15/2013 0441   GFRNONAA 67* 03/15/2013 0441   GFRAA 78* 03/15/2013 0441    Recent Labs  03/12/13 0944  TROPONINI <0.30    Radiology/Studies: Dg Chest 2 View  03/11/2013   CLINICAL DATA:  Bradycardia. Left lower extremity swelling. Prior history of pulmonary embolism.  EXAM: CHEST  2 VIEW  COMPARISON:  CT ANGIO CHEST dated 12/04/2005; DG CHEST 2 VIEW dated 12/04/2005  FINDINGS: Cardiac silhouette mildly enlarged but stable. Hilar and mediastinal contours  otherwise unremarkable. Focal airspace opacity in the right lower lobe. Lungs otherwise clear. Bronchovascular markings normal. Pulmonary vascularity normal. No pneumothorax. No pleural effusions. Visualized bony thorax intact.  IMPRESSION: 1. Atelectasis versus pneumonia involving the right lower lobe. 2. Stable mild cardiomegaly without pulmonary edema.   Electronically Signed   By: Philip Chavez M.D.   On: 03/11/2013 12:47   Dg Cervical Spine Complete  03/11/2013   CLINICAL DATA:  Left-sided neck pain.  No known injuries.  EXAM: CERVICAL SPINE  4+ VIEWS  COMPARISON:  None.  FINDINGS: Reversal of the usual cervical lordosis centered at C4-5. Anatomic posterior alignment. No visible fractures. Moderate to severe disc space narrowing and endplate hypertrophic changes at C4-5, C5-6, and C6-7, worst at C5-6. Ossification in the anterior annular fibers at C4-5. Normal prevertebral soft tissues. Facet joints intact. Severe bilateral foraminal stenoses suspected at C4-5, C5-6, and C6-7. No static evidence of instability.  IMPRESSION: 1. Reversal of the usual cervical lordosis which may reflect positioning and/or spasm. 2. No evidence of fracture or static signs of instability. 3. Multilevel degenerative disc disease and spondylosis, worst at C5-6, with multilevel foraminal stenoses.   Electronically Signed   By: Philip Chavez M.D.   On: 03/11/2013 12:50  Ct Angio Chest W/cm &/or Wo Cm  03/11/2013   ADDENDUM REPORT: 03/11/2013 17:37  ADDENDUM: Critical Value/emergent results were called by telephone at the time of interpretation on 03/11/2013 at 5:36 PM to Philip Chavez , who verbally acknowledged these results.   Electronically Signed   By: Philip Chavez M.D.   On: 03/11/2013 17:37   03/11/2013   CLINICAL DATA:  Short of breath, positive deep venous thrombosis  EXAM: CT ANGIOGRAPHY CHEST WITH CONTRAST  TECHNIQUE: Multidetector CT imaging of the chest was performed using the standard protocol during bolus  administration of intravenous contrast. Multiplanar CT image reconstructions including MIPs were obtained to evaluate the vascular anatomy.  CONTRAST:  130mL OMNIPAQUE IOHEXOL 350 MG/ML SOLN  COMPARISON:  DG CHEST 2 VIEW dated 03/11/2013; CT ANGIO CHEST dated 12/04/2005  FINDINGS: There is a tubular filling defect within the proximal right lower lobe pulmonary artery consistent with acute pulmonary embolism. There is a small pulmonary infarction peripheral to this right lower lobe embolus (image 65, series 6. There is a filling defect within the proximal left lower lobe pulmonary artery (image 52, series 4. No evidence of right ventricular strain. Overall clot burden is moderate. The pattern of pulmonary emboli is similar to 12/03/2005 and slightly less severe.  No axillary or supraclavicular lymphadenopathy. No mediastinal lymphadenopathy. No pericardial effusion. Esophagus is normal.  No evidence of pneumonia or pneumothorax. Right lower lobe pulmonary infarction.  Limited view of the upper abdomen is unremarkable. Limited view of the skeleton demonstrates no aggressive osseous lesion.  Review of the MIP images confirms the above findings.  IMPRESSION: 1. Bilateral lower lobe acute pulmonary emboli. Overall clot burden is moderate. 2. Small right lower lobe pulmonary infarction.  Electronically Signed: By: Philip Chavez M.D. On: 03/11/2013 17:22    Echocardiogram: Study Conclusions - Left ventricle: The cavity size was mildly dilated. Wall thickness was increased in a pattern of mild LVH. Systolic function was normal. The estimated ejection fraction was in the range of 50% to 55%. Wall motion was normal; there were no regional wall motion abnormalities. Doppler parameters are consistent with high ventricular filling pressure. - Aortic valve: Mild regurgitation. - Right atrium: The atrium was mildly dilated.  Telemetry: complete heart block with junctional escape, V rate in 40s   Assessment and  Plan  1. Complete heart block - awaiting PPM implant today 2. DVT / PE - on Xarelto   Signed, Philip Chavez, Philip Chavez  EP Attending  Patient seen and examined. Agree with above history, physical exam, assessment and plan. I have discussed the risks/benefits/goals/expectations of PPM insertion with the patient and he wishes to proceed.  [name].D.

## 2013-03-15 NOTE — Progress Notes (Signed)
Family Medicine Teaching Service Daily Progress Note Intern Pager: [phone]  Patient name: Philip Chavez Medical record number: [id number] Date of birth: August 21, 1950 Age: 63 y.o. Gender: male  Primary Care Provider: Pcp Not In System Consultants: Cardiology Code Status: Full code   Pt Overview and Major Events to Date:  2/5 - Admitted after being evaluated at [facility] for LLE swelling and pain. 2/5 - CT angio revealed bilateral PE's with RLL infarction (small); LE dopper revealed deep vein thrombosis involving the left common femoral vein, left profunda femoris vein,left femoral vein, left popliteal vein, and left posterial tibial vein. Patient also noted be in 3rd degree heart block.  Started on Heparin gtt initially then placed on Xarelto 2/6 - Cardiology recommending staying over the weekend 2/7-2/8 - Remains in 3rd degree Heart Block, c/o back pain 2/9- Pacemaker planned for today  Assessment and Plan:  Philip Chavez is a 63 y.o. male presenting with 5 day history of left swollen leg and found to have DVT extending from posterior tibial vein to common femoral vein & bilateral PE with RLL infarct, also new diagnosis of 3rd degree AV block. 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  # LLE DVT & Bilateral PE: ultrasound confirmed DVT of left leg in posterior tibial, popliteal, femoral, common femoral veins; CFV DVT appears to be mobile. Denies SOB or CP, but CT angio PE shows bilateral lower lobe acute PE (moderate burden) and small RLL pulmonary infarct.On Xarelto. Lab workup otherwise normal, no leukocytosis or anemia, trop neg x 4. Has previous workup for hypercoagulability in 2008 showing negative FVL, prothrombin gene mutation, antithrombin III level normal.  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 - Back pain, improved on tramadol and PRN oxycodone  # Complete AV block - Remains  asymptomatic - Cardiology following; patient to have pacemaker placed today - [place] titer pending  # Neck pain - Resolved.  FEN/GI: heart healthy diet, saline lock iv  Prophylaxis: Xarelto for DVT/PE treatment  Disposition: Will remain in SDU over the weekend; Will most likely need pacemaker.   Subjective:  No complaints today, reports back pain is improved. Denies cp, SOB. Agreeable to having pacemaker placed today.   Objective: Temp:  [97.7 F (36.5 C)-98.7 F (37.1 C)] 98.2 F (36.8 C) (02/09 1143) Pulse Rate:  [43-52] 46 (02/09 0333) Resp:  [17-24] 22 (02/09 1143) BP: (123-153)/(55-70) 132/55 mmHg (02/09 1143) SpO2:  [97 %-100 %] 99 % (02/09 1143)  Physical Exam: General: well appearing gentleman in NAD, sitting in chair watching TV Cardiovascular: Bradycardic. Regular. No murmurs, rubs, or gallops.  Respiratory: CTAB. No rales, rhonchi, or wheeze.  Abdomen: soft, nontender, nondistended.  Extremities: trace to 1+ BL LE edema, L>R Skin: Warm, dry, intact.   Laboratory:  Recent Labs Lab 03/13/13 0246 03/14/13 0312 03/15/13 0441  WBC 5.7 6.8 6.8  HGB 14.2 14.5 14.4  HCT 40.3 41.4 41.8  PLT 212 221 224    Recent Labs Lab 03/11/13 1148 03/15/13 0441  NA 140 137  K 4.5 4.1  CL 103 99  CO2 26 25  BUN 15 14  CREATININE 1.04 1.14  CALCIUM 9.4 9.2  PROT 8.2  --   BILITOT 0.3  --   ALKPHOS 92  --   ALT 27  --   AST 24  --   GLUCOSE 106* 97    Imaging/Diagnostic Tests: ECHO: Study Conclusions - Left ventricle: The cavity  size was mildly dilated. Wall thickness was increased in a pattern of mild LVH. Systolic function was normal. The estimated ejection fraction was in the range of 50% to 55%. Wall motion was normal; there were no regional wall motion abnormalities. Doppler parameters are consistent with high ventricular filling pressure. - Aortic valve: Mild regurgitation. - Right atrium: The atrium was mildly dilated  [place] Resident, MS4  03/15/2013, 12:17 PM Pager [phone]  I have seen the above patient and agree with the above note.   Patient to go for pacer today. My physical exam is below:  General: well appearing gentleman in NAD.  Cardiovascular: Bradycardic. Regular. No murmurs, rubs, or gallops.  Respiratory: CTAB. No rales, rhonchi, or wheeze.  Abdomen: soft, nontender, nondistended.  Extremities: trace to 1+ BL LE edema, L>R Skin: Warm, dry, intact.   [employer] PGY-2

## 2013-03-15 NOTE — Progress Notes (Signed)
Family Medicine Teaching Service Attending Note  I interviewed and examined patient Philip Chavez and reviewed their tests and x-rays.  I discussed with Dr. Lacinda Axon and reviewed their note for today.  I agree with their assessment and plan.     Additionally  Proceed as per cardiology Will likely need lifelong anticoagulation

## 2013-03-15 NOTE — Discharge Summary (Deleted)
[facility] Discharge Summary  Patient name: Philip Chavez Medical record number: [id number] Date of birth: [birth year] Age: 63 y.o. Gender: male Date of Admission: 03/11/2013  Date of Discharge: 03/17/2013 Admitting Physician: Alveda Reasons, MD  Primary Care Provider: Pcp Not In System Consultants: Cardiology  Indication for Hospitalization: Bilateral PE, third degree heart block  Discharge Diagnoses/Problem List:  Third degree heart block Left LE DVT Bilateral moderate sized pulmonary embolism, with pulmonary infarction  Disposition: Home  Discharge Condition: Improved  Discharge Exam:  General: well appearing gentleman in NAD, sitting in chair watching TV Cardiovascular: Bradycardic. Regular. No murmurs, rubs, or gallops.  Respiratory: CTAB. No rales, rhonchi, or wheeze.  Abdomen: soft, nontender, nondistended.  Extremities: trace to 1+ BL LE edema, L>R  Skin: Warm, dry, intact.   Brief Hospital Course: Philip Chavez is a 63 year old gentleman who presented to the ED from urgent care for leg swelling/pain and arrhythmia. He was admitted to the cardiac stepdown unit with left LE DVT, bilateral moderate sized PE, and third degree heart block. In regards to the patient's PE, he remained asymptomatic with no difficulty breathing, no chest pain, and no SOB. He was initially placed on a heparin drip and this was transitioned to rivaroxiban on hospital day 1. Troponins were negative x3. Cardiology was consulted and recommended pacemaker placement, however the patient was initially hesitant. Per cardiology his third degree heart block could be a result of his acute PE, and thus the patient remained in the hospital to assess for spontaneous resolution. When this did not resolve on hospital day 4 the patient had a pacemaker placed by cardiology on 03/15/13. He tolerated the procedure well with no complications.  Following pacemaker placement the patient was noted to  have PVCs and short runs of polymrphic Vtach on telemetry. He was started on Metoprolol 25mg  BID on 03/16/13 and monitored overnight. He remained asymptomatic throughout these episodes. On 03/17/13 the patient was seen and evaluated and felt to be stable for discharge home with outpatient followup.  Issues for Follow Up: Lifelong anticoagulation with Xarelto. Dose change from 15mg  BID to 20mg  QD on 04/02/13  Significant Procedures: Medtronic dual-chamber pacemaker placed  Significant Labs and Imaging:   Recent Labs Lab 03/15/13 0441 03/16/13 0415 03/17/13 0258  WBC 6.8 5.4 6.1  HGB 14.4 13.9 14.3  HCT 41.8 40.4 41.1  PLT 224 220 218    Recent Labs Lab 03/11/13 1148 03/15/13 0441 03/16/13 0910  NA 140 137 138  K 4.5 4.1 4.6  CL 103 99 100  CO2 26 25 25   GLUCOSE 106* 97 118*  BUN 15 14 13   CREATININE 1.04 1.14 1.15  CALCIUM 9.4 9.2 9.7  MG 1.9  --  1.8  ALKPHOS 92  --   --   AST 24  --   --   ALT 27  --   --   ALBUMIN 3.7  --   --    ECHO:  Study Conclusions - Left ventricle: The cavity size was mildly dilated. Wall thickness was increased in a pattern of mild LVH. Systolic function was normal. The estimated ejection fraction was in the range of 50% to 55%. Wall motion was normal; there were no regional wall motion abnormalities. Doppler parameters are consistent with high ventricular filling pressure. - Aortic valve: Mild regurgitation. - Right atrium: The atrium was mildly dilated   Results/Tests Pending at Time of Discharge: [place] burgdorferi antibody   Discharge Medications:    Medication  List         metoprolol tartrate 25 MG tablet  Commonly known as:  LOPRESSOR  Take 1 tablet (25 mg total) by mouth 2 (two) times daily.     naproxen sodium 220 MG tablet  Commonly known as:  ANAPROX  Take 440 mg by mouth daily as needed (for pain).     traMADol 50 MG tablet  Commonly known as:  ULTRAM  Take 50 mg by mouth daily as needed for moderate pain.         Discharge Instructions: Please refer to Patient Instructions section of EMR for full details.  Patient was counseled important signs and symptoms that should prompt return to medical care, changes in medications, dietary instructions, activity restrictions, and follow up appointments.   Follow-Up Appointments:  Follow-up Information   Follow up with [facility] On 03/25/2013. (At 9:00 AM for wound check)    Specialty:  Cardiology   Contact information:   [address] [place] [address] [phone]      Follow up with [name], MD On 06/17/2013. (At 2:00 PM)    Specialty:  Cardiology   Contact information:   [address] [place] [address] [phone]       Follow up with Thersa Salt, DO On 03/31/2013. (10:45 am)    Specialty:  Family Medicine   Contact information:   [place] [address] [phone]      [place], [place]  I have seen the patient and agree with the above note.   My exam is below: General: well appearing, resting in bed, NAD.  Cardiovascular: Regular. No murmurs, rubs, or gallops.  Respiratory: CTAB. No rales, rhonchi, or wheeze.  Abdomen: soft, nontender, nondistended.  Extremities: trace left LE edema Skin: Pacer site without redness, drainage, or ecchymosis.  [place] PGY-2

## 2013-03-15 NOTE — CV Procedure (Signed)
Electrophysiology procedure note  Procedure: Insertion of a dual-chamber pacemaker utilizing left upper extremity venography  Indication: Complete heart block with a slow ventricular response  Description of procedure: After informed consent was obtained, the patient was taken to the diagnostic electrophysiology laboratory in the fasting state. After the usual preparation and draping, intravenous fentanyl and Versed were given for sedation. 30 cc of lidocaine was infiltrated into the left pectoral region. A 5 cm incision was carried out over this region and electrocautery was utilized to dissect down to the fascial plane. Initial attempt to puncture the left subclavian vein were unsuccessful. 10 cc of IV contrast was then delivered to the left upper extremity venous system, which demonstrated that the vein was displaced superiorly. It was then successfully punctured x2. The Medtronic 58 cm active-fixation pacing lead serial number [id number] was advanced into the right ventricle, and the Medtronic model 5076 52 cm pacing lead serial number [id number] was advanced to the right atrium. Mapping was first carried out in the right ventricle, and at the final site near the right ventricular apical septum, the R waves measured greater than 20 mV. With the lead actively fixed, there is a large current of injury. The pacing impedance was 724 ohms. [place] in ostium of the diaphragm. The threshold was 1 V at 0.4 ms. With these satisfactory parameters, attention was turned to the right atrial lead. Mapping was carried out in the right atrium at the final site along the anterolateral portion of the right atrium, the P waves measured 2.5 millivolts, and the pacing threshold once the lead was actively fixed was 0.9 V at 0.4 ms. There is a large injury current. 10 V pacing did not stimulate the diaphragm. With these satisfactory parameters, the leads were secured to the sub pectoralis fascia with silk suture. The sewing  sleeves were also secured with silk suture. A subcutaneous pocket was made with electrocautery. The Medtronic dual chamber pacemaker serial number and [id number] was connected to the atrial and ventricular pacing leads and placed back in the subcutaneous pocket. The pocket was irrigated with antibiotic irrigation. Electrocautery was utilized to assure hemostasis. The incision was closed with 2 layers of Vicryl suture. Benzoin and Steri-Strips for pain on the skin. The patient was returned to his room in satisfactory condition.  Complications: There were no immediate procedure complications  Conclusion: This demonstrate successful implantation of a Medtronic dual-chamber pacemaker in a patient with complete heart block, with no reversible causes.  [name], M.D.

## 2013-03-16 ENCOUNTER — Inpatient Hospital Stay (HOSPITAL_COMMUNITY): Payer: BC Managed Care – PPO

## 2013-03-16 ENCOUNTER — Encounter (HOSPITAL_COMMUNITY): Payer: Self-pay | Admitting: General Practice

## 2013-03-16 LAB — CBC
HEMATOCRIT: 40.4 % (ref 39.0–52.0)
Hemoglobin: 13.9 g/dL (ref 13.0–17.0)
MCH: 30.2 pg (ref 26.0–34.0)
MCHC: 34.4 g/dL (ref 30.0–36.0)
MCV: 87.8 fL (ref 78.0–100.0)
Platelets: 220 10*3/uL (ref 150–400)
RBC: 4.6 MIL/uL (ref 4.22–5.81)
RDW: 13.4 % (ref 11.5–15.5)
WBC: 5.4 10*3/uL (ref 4.0–10.5)

## 2013-03-16 LAB — BASIC METABOLIC PANEL
BUN: 13 mg/dL (ref 6–23)
CO2: 25 meq/L (ref 19–32)
Calcium: 9.7 mg/dL (ref 8.4–10.5)
Chloride: 100 mEq/L (ref 96–112)
Creatinine, Ser: 1.15 mg/dL (ref 0.50–1.35)
GFR calc non Af Amer: 66 mL/min — ABNORMAL LOW (ref >90)
GFR, EST AFRICAN AMERICAN: 77 mL/min — AB (ref >90)
GLUCOSE: 118 mg/dL — AB (ref 70–99)
POTASSIUM: 4.6 meq/L (ref 3.7–5.3)
Sodium: 138 mEq/L (ref 137–147)

## 2013-03-16 LAB — MAGNESIUM: Magnesium: 1.8 mg/dL (ref 1.5–2.5)

## 2013-03-16 MED ORDER — METOPROLOL TARTRATE 25 MG PO TABS
25.0000 mg | ORAL_TABLET | Freq: Two times a day (BID) | ORAL | Status: DC
  Administered 2013-03-16 (×2): 25 mg via ORAL
  Filled 2013-03-16 (×4): qty 1

## 2013-03-16 NOTE — Progress Notes (Signed)
Family Medicine Teaching Service Attending Note  I interviewed and examined patient Philip Chavez and reviewed their tests and x-rays.  I discussed with Dr. Thomes Dinning and reviewed their note for today.  I agree with their assessment and plan.     Additionally  Will proceed as per cardiology

## 2013-03-16 NOTE — Progress Notes (Signed)
   ELECTROPHYSIOLOGY ROUNDING NOTE    Patient Name: Philip Chavez Date of Encounter: 03/16/2013    SUBJECTIVE:Patient feels well this morning.  No chest pain or shortness of breath.  Minimal incisional pain   TELEMETRY: Reviewed telemetry pt in sinus rhythm with ventricular pacing, runs of polymorphic VT Filed Vitals:   03/15/13 1945 03/15/13 2018 03/15/13 2327 03/16/13 0058  BP: 150/86 152/88 129/82   Pulse: 90 92 83   Temp:  98.5 F (36.9 C) 98.7 F (37.1 C)   TempSrc:  Oral Oral   Resp:  16 18   Height:      Weight:    205 lb (92.987 kg)  SpO2: 99% 97% 97%     Intake/Output Summary (Last 24 hours) at 03/16/13 0607 Last data filed at 03/16/13 0525  Gross per 24 hour  Intake 615.34 ml  Output      0 ml  Net 615.34 ml    CURRENT MEDICATIONS: .  ceFAZolin (ANCEF) IV  2 g Intravenous Q6H  . Rivaroxaban  15 mg Oral BID WC  . [START ON 04/07/2013] rivaroxaban  20 mg Oral Q supper  . sodium chloride  3 mL Intravenous Q12H    LABS: Basic Metabolic Panel:  Recent Labs  03/15/13 0441  NA 137  K 4.1  CL 99  CO2 25  GLUCOSE 97  BUN 14  CREATININE 1.14  CALCIUM 9.2   CBC:  Recent Labs  03/15/13 0441 03/16/13 0415  WBC 6.8 5.4  HGB 14.4 13.9  HCT 41.8 40.4  MCV 88.6 87.8  PLT 224 220    Radiology/Studies:  No PTX, leads in good position  PHYSICAL EXAM Left chest without hematoma/ecchymosis  DEVICE INTERROGATION: Device interrogation demonstrates normal device function   Active Problems:   DVT (deep venous thrombosis)   Embolism, pulmonary with infarction   Third degree heart block  PVC's which appear to be originating from the LV Rec: will check electrolytes, start low dose beta blocker (metoprolol 25 bid), and observe on Tele. Likely delay dc home until tomorrow.  [name].D.

## 2013-03-16 NOTE — Progress Notes (Signed)
Pt had 14 beat run of V-Tach. Pt denied CP, SOB, and in no distress.  Vital signs stable as charted.  Dr. Colon Flattery informed of V-tach, no new orders recieved

## 2013-03-16 NOTE — Progress Notes (Addendum)
Patient monitored for ventricular ectopy today. Cardiac rhythm AV paced Prior to 0845 noted frequent non sustained VT ( 3-4 beats of polymorphic VT). After 0845 3-beat runs of VT noted much less frequently and one 4-beat run of VT at 1739 and 1743 noted.

## 2013-03-16 NOTE — Progress Notes (Signed)
Family Medicine Teaching Service Daily Progress Note Intern Pager: [phone]  Patient name: Philip Chavez Medical record number: [id number] Date of birth: Nov 14, 1950 Age: 63 y.o. Gender: male  Primary Care Provider: Pcp Not In System Consultants: Cardiology Code Status: Full code   Pt Overview and Major Events to Date:  2/5 - Admitted after being evaluated at [facility] for LLE swelling and pain. 2/5 - CT angio revealed bilateral PE's with RLL infarction (small); LE dopper revealed deep vein thrombosis involving the left common femoral vein, left profunda femoris vein,left femoral vein, left popliteal vein, and left posterial tibial vein. Patient also noted be in 3rd degree heart block.  Started on Heparin gtt initially then placed on Xarelto 2/6 - Cardiology recommending staying over the weekend 2/7-2/8 - Remains in 3rd degree Heart Block, c/o back pain 2/9- Dual chamber pacemaker placed 2/10- runs of Vtach on tele. Metoprolol 25mg  started  Assessment and Plan:  Philip Chavez is a 63 y.o. male presenting with 5 day history of left swollen leg and found to have DVT extending from posterior tibial vein to common femoral vein & bilateral PE with RLL infarct, also new diagnosis of 3rd degree AV block. 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  # LLE DVT & Bilateral PE: ultrasound confirmed DVT of left leg in posterior tibial, popliteal, femoral, common femoral veins; CFV DVT appears to be mobile. Denies SOB or CP, but CT angio PE shows bilateral lower lobe acute PE (moderate burden) and small RLL pulmonary infarct.On Xarelto. Lab workup otherwise normal, no leukocytosis or anemia, trop neg x 4. Has previous workup for hypercoagulability in 2008 showing negative FVL, prothrombin gene mutation, antithrombin III level normal.  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 - Back pain, improved on tramadol and PRN  oxycodone  # Complete AV block - Received dual chamber pacemaker on 2/9.  - Begin metoprolol 25mg  BID per cardiology for Vtach  # Neck pain - Resolved.  FEN/GI: heart healthy diet, saline lock iv  Prophylaxis: Xarelto for DVT/PE treatment  Disposition: Will remain in SDU over the weekend; Will most likely need pacemaker.   Subjective:  No complaints today, reports he feels well following pacemaker placement, no pain at incision site. No difficulty breathing or SOB.  Objective: Temp:  [98 F (36.7 C)-98.7 F (37.1 C)] 98 F (36.7 C) (02/10 1219) Pulse Rate:  [44-92] 81 (02/10 1219) Resp:  [16-20] 18 (02/10 1219) BP: (129-166)/(80-97) 166/80 mmHg (02/10 1219) SpO2:  [94 %-99 %] 99 % (02/10 1219) Weight:  [205 lb (92.987 kg)] 205 lb (92.987 kg) (02/10 0058)  Physical Exam: General: well appearing gentleman in NAD, sitting in chair watching TV Cardiovascular: Regular rate and rhythm No murmurs, rubs, or gallops.  Respiratory: CTAB. No rales, rhonchi, or wheeze.  Abdomen: soft, nontender, nondistended.  Extremities: trace to 1+ BL LE edema, L>R Skin: Warm, dry, intact. Pacemaker pocket in left upper chest wall appears dry and nonerythematous.   Laboratory:  Recent Labs Lab 03/14/13 0312 03/15/13 0441 03/16/13 0415  WBC 6.8 6.8 5.4  HGB 14.5 14.4 13.9  HCT 41.4 41.8 40.4  PLT 221 224 220    Recent Labs Lab 03/11/13 1148 03/15/13 0441 03/16/13 0910  NA 140 137 138  K 4.5 4.1 4.6  CL 103 99 100  CO2 26 25 25   BUN 15 14 13   CREATININE 1.04 1.14 1.15  CALCIUM 9.4 9.2 9.7  PROT 8.2  --   --   BILITOT 0.3  --   --   ALKPHOS 92  --   --   ALT 27  --   --   AST 24  --   --   GLUCOSE 106* 97 118*    Imaging/Diagnostic Tests: ECHO: Study Conclusions - Left ventricle: The cavity size was mildly dilated. Wall thickness was increased in a pattern of mild LVH. Systolic function was normal. The estimated ejection fraction was in the range of 50% to 55%. Wall  motion was normal; there were no regional wall motion abnormalities. Doppler parameters are consistent with high ventricular filling pressure. - Aortic valve: Mild regurgitation. - Right atrium: The atrium was mildly dilated  [place] Resident, MS4  03/16/2013, 12:33 PM Pager [phone]

## 2013-03-16 NOTE — Progress Notes (Signed)
Family Medicine Teaching Service Daily Progress Note Intern Pager: [phone]  Patient name: Philip Chavez Medical record number: [id number] Date of birth: [birth year] Age: 63 y.o. Gender: male  Primary Care Provider: Pcp Not In System Consultants: Cardiology Code Status: Full code   Pt Overview and Major Events to Date:  2/5 - Admitted after being evaluated at [facility] for LLE swelling and pain. 2/5 - CT angio revealed bilateral PE's with RLL infarction (small); LE dopper revealed deep vein thrombosis involving the left common femoral vein, left profunda femoris vein,left femoral vein, left popliteal vein, and left posterial tibial vein. Patient also noted be in 3rd degree heart block.  Started on Heparin gtt initially then placed on Xarelto 2/6 - Cardiology recommending staying over the weekend 2/7-2/8 - Remains in 3rd degree Heart Block, c/o back pain 2/9- Dual chamber pacemaker placed 2/10- runs of Vtach on tele. Metoprolol 25mg  started  Assessment and Plan:  Philip Chavez is a 63 y.o. male presenting with 5 day history of left swollen leg and found to have DVT extending from posterior tibial vein to common femoral vein & bilateral PE with RLL infarct, also new diagnosis of 3rd degree AV block. 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  # LLE DVT & Bilateral PE: ultrasound confirmed DVT of left leg in posterior tibial, popliteal, femoral, common femoral veins; CFV DVT appears to be mobile. Denies SOB or CP, but CT angio PE shows bilateral lower lobe acute PE (moderate burden) and small RLL pulmonary infarct.On Xarelto. Lab workup otherwise normal, no leukocytosis or anemia, trop neg x 4. Has previous workup for hypercoagulability in 2008 showing negative FVL, prothrombin gene mutation, antithrombin III level normal.  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 - Back pain, improved on tramadol and PRN  oxycodone  # Complete AV block - Received dual chamber pacemaker on 2/9.  - Begin metoprolol 25mg  BID per cardiology for PVC's apparently from LV  # Neck pain - Resolved.  FEN/GI: heart healthy diet, saline lock iv  Prophylaxis: Xarelto for DVT/PE treatment  Disposition: discharge home most likely tomorrow 2/11  Subjective:  No complaints today, reports he feels well following pacemaker placement, no pain at incision site. No difficulty breathing or SOB.  Objective: Temp:  [98 F (36.7 C)-98.7 F (37.1 C)] 98 F (36.7 C) (02/10 1219) Pulse Rate:  [44-92] 81 (02/10 1219) Resp:  [16-20] 18 (02/10 1219) BP: (129-166)/(80-97) 166/80 mmHg (02/10 1219) SpO2:  [94 %-99 %] 99 % (02/10 1219) Weight:  [205 lb (92.987 kg)] 205 lb (92.987 kg) (02/10 0058)  Physical Exam: General: well appearing gentleman in NAD, sitting in chair watching TV Cardiovascular: Regular rate and rhythm No murmurs, rubs, or gallops.  Respiratory: CTAB. No rales, rhonchi, or wheeze.  Abdomen: soft, nontender, nondistended.  Extremities: trace to 1+ BL LE edema, L>R Skin: Warm, dry, intact. Pacemaker pocket in left upper chest wall appears dry and nonerythematous.   Laboratory:  Recent Labs Lab 03/14/13 0312 03/15/13 0441 03/16/13 0415  WBC 6.8 6.8 5.4  HGB 14.5 14.4 13.9  HCT 41.4 41.8 40.4  PLT 221 224 220    Recent Labs Lab 03/11/13 1148 03/15/13 0441 03/16/13 0910  NA 140 137 138  K 4.5 4.1 4.6  CL 103 99 100  CO2 26 25 25   BUN 15 14 13   CREATININE 1.04 1.14 1.15  CALCIUM 9.4 9.2 9.7  PROT 8.2  --   --  BILITOT 0.3  --   --   ALKPHOS 92  --   --   ALT 27  --   --   AST 24  --   --   GLUCOSE 106* 97 118*    Imaging/Diagnostic Tests: ECHO: Study Conclusions - Left ventricle: The cavity size was mildly dilated. Wall thickness was increased in a pattern of mild LVH. Systolic function was normal. The estimated ejection fraction was in the range of 50% to 55%. Wall motion was  normal; there were no regional wall motion abnormalities. Doppler parameters are consistent with high ventricular filling pressure. - Aortic valve: Mild regurgitation. - Right atrium: The atrium was mildly dilated  [place] Resident, MS4  03/16/2013, 12:33 PM Pager [phone]  Teaching Service Addendum. I have seen and evaluated this pt and agree with above assessment and plan as is documented on this note.   Pt with hx of DVT, PE and 3rd degree heart block s/p pacemaker.  With normal device function had some PVC's that appear to be originating from LV per EP note.  Electrolytes are wnl Metoprolol 25mg   BID was started and disposition is to delay d/c home for tomorrow.  D. [place] Philippa Sicks, MD Family Medicine  PGY-3

## 2013-03-17 LAB — CBC
HCT: 41.1 % (ref 39.0–52.0)
Hemoglobin: 14.3 g/dL (ref 13.0–17.0)
MCH: 30.4 pg (ref 26.0–34.0)
MCHC: 34.8 g/dL (ref 30.0–36.0)
MCV: 87.4 fL (ref 78.0–100.0)
Platelets: 218 10*3/uL (ref 150–400)
RBC: 4.7 MIL/uL (ref 4.22–5.81)
RDW: 13.2 % (ref 11.5–15.5)
WBC: 6.1 10*3/uL (ref 4.0–10.5)

## 2013-03-17 MED ORDER — RIVAROXABAN 15 MG PO TABS: 15.0000 mg | ORAL_TABLET | Freq: Two times a day (BID) | ORAL | Status: DC

## 2013-03-17 MED ORDER — METOPROLOL TARTRATE 25 MG PO TABS: 25.0000 mg | ORAL_TABLET | Freq: Two times a day (BID) | ORAL | Status: DC

## 2013-03-17 MED ORDER — METOPROLOL TARTRATE 50 MG PO TABS
50.0000 mg | ORAL_TABLET | Freq: Two times a day (BID) | ORAL | Status: DC
  Administered 2013-03-17 – 2013-03-18 (×3): 50 mg via ORAL
  Filled 2013-03-17 (×3): qty 1

## 2013-03-17 MED ORDER — RIVAROXABAN 20 MG PO TABS: 20.0000 mg | ORAL_TABLET | Freq: Every day | ORAL | Status: DC

## 2013-03-17 NOTE — Discharge Instructions (Signed)
° ° °  Supplemental Discharge Instructions for  Pacemaker/Defibrillator Patients  Activity No heavy lifting or vigorous activity with your left/right arm for 6 to 8 weeks.  Do not raise your left/right arm above your head for one week.  Gradually raise your affected arm as drawn below.           02/12                       02/13                      02/14                      02/15       NO DRIVING for 1 week; you may begin driving on 67/89/3810. WOUND CARE   Keep the wound area clean and dry.  Do not get this area wet for one week. No showers for one week; you may shower on 03/24/2013.   The tape/steri-strips on your wound will fall off; do not pull them off.  No bandage is needed on the site.  DO  NOT apply any creams, oils, or ointments to the wound area.   If you notice any drainage or discharge from the wound, any swelling or bruising at the site, or you develop a fever > 101? F after you are discharged home, call the office at once.  Special Instructions   You are still able to use cellular telephones; use the ear opposite the side where you have your pacemaker/defibrillator.  Avoid carrying your cellular phone near your device.   When traveling through airports, show security personnel your identification card to avoid being screened in the metal detectors.  Ask the security personnel to use the hand wand.   Avoid arc welding equipment, MRI testing (magnetic resonance imaging), TENS units (transcutaneous nerve stimulators).  Call the office for questions about other devices.   Avoid electrical appliances that are in poor condition or are not properly grounded.   Microwave ovens are safe to be near or to operate.  Family Medicine: Follow up as indicated. Continue your medications as prescribed.  You will continue to be on the xarelto.

## 2013-03-17 NOTE — Discharge Summary (Signed)
[facility] Discharge Summary  Patient name: Philip Chavez Medical record number: [id number] Date of birth: 1950-04-18 Age: 63 y.o. Gender: male Date of Admission: 03/11/2013  Date of Discharge: 03/17/2013 Admitting Physician: Alveda Reasons, MD  Primary Care Provider: Pcp Not In System Consultants: Cardiology  Indication for Hospitalization: Bilateral PE, third degree heart block  Discharge Diagnoses/Problem List:  Third degree heart block Left LE DVT Bilateral moderate sized pulmonary embolism, with pulmonary infarction  Disposition: Home  Discharge Condition: Improved  Discharge Exam:  General: well appearing gentleman in NAD, sitting in chair watching TV Cardiovascular: Bradycardic. Regular. No murmurs, rubs, or gallops.  Respiratory: CTAB. No rales, rhonchi, or wheeze.  Abdomen: soft, nontender, nondistended.  Extremities: trace to 1+ BL LE edema, L>R  Skin: Warm, dry, intact.   Brief Hospital Course: Philip Chavez is a 63 year old gentleman who presented to the ED from urgent care for leg swelling/pain and arrhythmia. He was admitted to the cardiac stepdown unit with left LE DVT, bilateral moderate sized PE, and third degree heart block. In regards to the patient's PE, he remained asymptomatic with no difficulty breathing, no chest pain, and no SOB. He was initially placed on a heparin drip and this was transitioned to rivaroxiban on hospital day 1. Troponins were negative x3. Cardiology was consulted and recommended pacemaker placement, however the patient was initially hesitant. Per cardiology his third degree heart block could be a result of his acute PE, and thus the patient remained in the hospital to assess for spontaneous resolution. When this did not resolve on hospital day 4 the patient had a pacemaker placed by cardiology on 03/15/13. He tolerated the procedure well with no complications.  Following pacemaker placement the patient was noted to  have PVCs and short runs of polymrphic Vtach on telemetry. He was started on Metoprolol 25mg  BID on 03/16/13 and monitored overnight. Metoprolol was increased to 50mg  BID and pt tolerated it well. He remained asymptomatic throughout these episodes. On 03/18/13 the patient was seen and evaluated and felt to be stable for discharge home with outpatient followup.  Issues for Follow Up: Lifelong anticoagulation with Xarelto. Dose change from 15mg  BID to 20mg  QD on 04/02/13  Significant Procedures: Medtronic dual-chamber pacemaker placed  Significant Labs and Imaging:   Recent Labs Lab 03/15/13 0441 03/16/13 0415 03/17/13 0258  WBC 6.8 5.4 6.1  HGB 14.4 13.9 14.3  HCT 41.8 40.4 41.1  PLT 224 220 218    Recent Labs Lab 03/15/13 0441 03/16/13 0910  NA 137 138  K 4.1 4.6  CL 99 100  CO2 25 25  GLUCOSE 97 118*  BUN 14 13  CREATININE 1.14 1.15  CALCIUM 9.2 9.7  MG  --  1.8   ECHO:  Study Conclusions - Left ventricle: The cavity size was mildly dilated. Wall thickness was increased in a pattern of mild LVH. Systolic function was normal. The estimated ejection fraction was in the range of 50% to 55%. Wall motion was normal; there were no regional wall motion abnormalities. Doppler parameters are consistent with high ventricular filling pressure. - Aortic valve: Mild regurgitation. - Right atrium: The atrium was mildly dilated   Results/Tests Pending at Time of Discharge: [place] burgdorferi antibody   Discharge Medications:    Medication List         metoprolol 50 MG tablet  Commonly known as:  LOPRESSOR  Take 1 tablet (50 mg total) by mouth 2 (two) times daily.  naproxen sodium 220 MG tablet  Commonly known as:  ANAPROX  Take 440 mg by mouth daily as needed (for pain).     Rivaroxaban 15 MG Tabs tablet  Commonly known as:  XARELTO  Take 1 tablet (15 mg total) by mouth 2 (two) times daily with a meal.     Rivaroxaban 20 MG Tabs tablet  Commonly known as:  XARELTO   Take 1 tablet (20 mg total) by mouth daily with supper.  Start taking on:  04/07/2013     traMADol 50 MG tablet  Commonly known as:  ULTRAM  Take 50 mg by mouth daily as needed for moderate pain.        Discharge Instructions: Please refer to Patient Instructions section of EMR for full details.  Patient was counseled important signs and symptoms that should prompt return to medical care, changes in medications, dietary instructions, activity restrictions, and follow up appointments.   Follow-Up Appointments:  Follow-up Information   Follow up with [facility] On 03/25/2013. (At 9:00 AM for wound check)    Specialty:  Cardiology   Contact information:   [address], [place] [address] [phone]      Follow up with [name], MD On 06/17/2013. (At 2:00 PM)    Specialty:  Cardiology   Contact information:   [address] [place] [address] [phone]       Follow up with Thersa Salt, DO On 03/31/2013. (10:45 am)    Specialty:  Family Medicine   Contact information:   [place] [address] [phone]      [place], [place]  I have seen the patient and agree with the above note.   My exam is below: General: well appearing, resting in bed, NAD.  Cardiovascular: Regular. No murmurs, rubs, or gallops.  Respiratory: CTAB. No rales, rhonchi, or wheeze.  Abdomen: soft, nontender, nondistended.  Extremities: trace left LE edema Skin: Pacer site without redness, drainage, or ecchymosis.  Signed: D. [place] Philippa Sicks, MD Family Medicine PGY-3

## 2013-03-17 NOTE — Progress Notes (Signed)
Family Medicine Teaching Service Daily Progress Note Intern Pager: [phone]  Patient name: Philip Chavez Medical record number: [id number] Date of birth: [birth year] Age: 63 y.o. Gender: male  Primary Care Provider: Pcp Not In System Consultants: Cardiology Code Status: Full code   Pt Overview and Major Events to Date:  2/5 - Admitted after being evaluated at [facility] for LLE swelling and pain. 2/5 - CT angio revealed bilateral PE's with RLL infarction (small); LE dopper revealed deep vein thrombosis involving the left common femoral vein, left profunda femoris vein,left femoral vein, left popliteal vein, and left posterial tibial vein. Patient also noted be in 3rd degree heart block.  Started on Heparin gtt initially then placed on Xarelto 2/6 - Cardiology recommending staying over the weekend 2/7-2/8 - Remains in 3rd degree Heart Block, c/o back pain 2/9- Dual chamber pacemaker placed 2/10- runs of Vtach on tele. Metoprolol 25mg  started  Assessment and Plan:  Philip Chavez is a 63 y.o. male presenting with 5 day history of left swollen leg and found to have DVT extending from posterior tibial vein to common femoral vein & bilateral PE with RLL infarct, also new diagnosis of 3rd degree AV block. 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  # LLE DVT & Bilateral PE: ultrasound confirmed DVT of left leg in posterior tibial, popliteal, femoral, common femoral veins; CFV DVT appears to be mobile. Denies SOB or CP, but CT angio PE shows bilateral lower lobe acute PE (moderate burden) and small RLL pulmonary infarct.On Xarelto. Lab workup otherwise normal, no leukocytosis or anemia, trop neg x 4. Has previous workup for hypercoagulability in 2008 showing negative FVL, prothrombin gene mutation, antithrombin III level normal.  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  # Complete AV block - Received dual  chamber pacemaker on 2/9.  - Increase metoprolol to 50mg  BID per cardiology for PVC's apparently from LV - EP physician wants to keep at least overnight to monitor given PVC's and polymorphic VT.  # Neck pain - Resolved.  FEN/GI: heart healthy diet, saline lock iv  Prophylaxis: Xarelto for DVT/PE treatment  Disposition: Pending discharge per cardiology.  Subjective:  No complaints today, reports he feels well following pacemaker placement, no pain at incision site. No difficulty breathing or SOB.  Objective: Temp:  [98.4 F (36.9 C)-98.8 F (37.1 C)] 98.4 F (36.9 C) (02/11 1305) Pulse Rate:  [82-102] 87 (02/11 1305) Resp:  [16-20] 18 (02/11 1305) BP: (124-139)/(76-90) 126/78 mmHg (02/11 1305) SpO2:  [95 %-99 %] 96 % (02/11 1305) Weight:  [209 lb 3.5 oz (94.9 kg)] 209 lb 3.5 oz (94.9 kg) (02/11 0004)  Physical Exam: General: well appearing gentleman in NAD, sitting in chair watching TV Cardiovascular: Regular rate and rhythm No murmurs, rubs, or gallops.  Respiratory: CTAB. No rales, rhonchi, or wheeze.  Abdomen: soft, nontender, nondistended.  Extremities: trace to 1+ BL LE edema, L>R Skin: Warm, dry, intact. Pacemaker pocket in left upper chest wall appears dry and nonerythematous.   Laboratory:  Recent Labs Lab 03/15/13 0441 03/16/13 0415 03/17/13 0258  WBC 6.8 5.4 6.1  HGB 14.4 13.9 14.3  HCT 41.8 40.4 41.1  PLT 224 220 218    Recent Labs Lab 03/11/13 1148 03/15/13 0441 03/16/13 0910  NA 140 137 138  K 4.5 4.1 4.6  CL 103 99 100  CO2 26 25 25   BUN 15 14 13   CREATININE 1.04 1.14 1.15  CALCIUM 9.4 9.2 9.7  PROT 8.2  --   --   BILITOT 0.3  --   --   ALKPHOS 92  --   --   ALT 27  --   --   AST 24  --   --   GLUCOSE 106* 97 118*    Imaging/Diagnostic Tests: ECHO: Study Conclusions - Left ventricle: The cavity size was mildly dilated. Wall thickness was increased in a pattern of mild LVH. Systolic function was normal. The estimated ejection  fraction was in the range of 50% to 55%. Wall motion was normal; there were no regional wall motion abnormalities. Doppler parameters are consistent with high ventricular filling pressure. - Aortic valve: Mild regurgitation. - Right atrium: The atrium was mildly dilated  [place] Resident, MS4  03/17/2013, 4:50 PM Pager [phone]  Patient seen and examined.   Awaiting discharge per cards.  They want to continue to observe.   Physical exam:  General: well appearing gentleman in NAD, sitting in chair watching TV  Cardiovascular: Bradycardic. Regular. No murmurs, rubs, or gallops.  Respiratory: CTAB. No rales, rhonchi, or wheeze.  Abdomen: soft, nontender, nondistended.  Extremities: trace to 1+ BL LE edema, L>R  Skin: Warm, dry, intact.   [place] PGY-2

## 2013-03-17 NOTE — Progress Notes (Signed)
    Subjective: Slept well.  Mild pain at pacer site  Objective: Vital signs in last 24 hours: Temp:  [98 F (36.7 C)-98.8 F (37.1 C)] 98.8 F (37.1 C) (02/11 0636) Pulse Rate:  [81-102] 93 (02/11 0636) Resp:  [16-20] 20 (02/11 0636) BP: (124-166)/(76-90) 124/81 mmHg (02/11 0636) SpO2:  [95 %-99 %] 95 % (02/11 0636) Weight:  [209 lb 3.5 oz (94.9 kg)] 209 lb 3.5 oz (94.9 kg) (02/11 0004) Last BM Date: 03/14/13  Intake/Output from previous day: 02/10 0701 - 02/11 0700 In: 872 [P.O.:822; IV Piggyback:50] Out: -  Intake/Output this shift:    Medications Current Facility-Administered Medications  Medication Dose Route Frequency Provider Last Rate Last Dose  . acetaminophen (TYLENOL) tablet 325-650 mg  325-650 mg Oral Q4H PRN Evans Lance, MD      . metoprolol tartrate (LOPRESSOR) tablet 25 mg  25 mg Oral BID Evans Lance, MD   25 mg at 03/16/13 2116  . ondansetron (ZOFRAN) injection 4 mg  4 mg Intravenous Q6H PRN Evans Lance, MD      . oxyCODONE (Oxy IR/ROXICODONE) immediate release tablet 5 mg  5 mg Oral Q4H PRN Timmothy Euler, MD   5 mg at 03/15/13 1916  . Rivaroxaban (XARELTO) tablet 15 mg  15 mg Oral BID WC Evans Lance, MD   15 mg at 03/16/13 1800  . [START ON 04/07/2013] Rivaroxaban (XARELTO) tablet 20 mg  20 mg Oral Q supper Alveda Reasons, MD      . sodium chloride 0.9 % injection 3 mL  3 mL Intravenous Q12H Dayarmys [place] de Gwendalyn Ege, MD   3 mL at 03/17/13 0600    PE: General appearance: alert, cooperative and no distress Lungs: clear to auscultation bilaterally Heart: regular rate and rhythm, S1, S2 normal, no murmur, click, rub or gallop Extremities: No LEE Pulses: 2+ and symmetric Skin: Pacer site: Mildly tender.  No erythema. edema or ecchymosis. Neurologic: Grossly normal  Lab Results:   Recent Labs  03/15/13 0441 03/16/13 0415 03/17/13 0258  WBC 6.8 5.4 6.1  HGB 14.4 13.9 14.3  HCT 41.8 40.4 41.1  PLT 224 220 218   BMET  Recent Labs  03/15/13 0441 03/16/13 0910  NA 137 138  K 4.1 4.6  CL 99 100  CO2 25 25  GLUCOSE 97 118*  BUN 14 13  CREATININE 1.14 1.15  CALCIUM 9.2 9.7    Assessment/Plan   Active Problems:   DVT (deep venous thrombosis)   Embolism, pulmonary with infarction   Third degree heart block  Plan:  POD#2:  Insertion of a Medtronic dual-chamber pacemaker for complete HB.  V-pacing at 80 currently.  Still having 3-4 beat NSVT/PVCs.  Lopressor added yesterday.  Potassium WNL.  BP controlled.  Xarelto for DVT/PE.  Ok to DC today from Cards.     LOS: 6 days    Philip Chavez 03/17/2013 7:12 AM

## 2013-03-17 NOTE — Progress Notes (Signed)
Patient: Philip Chavez Date of Encounter: 03/17/2013, 11:26 AM Admit date: 03/11/2013     Subjective  Mr. Shuman has no complaints this AM.   Objective  Physical Exam: Vitals: BP 128/80  Pulse 82  Temp(Src) 98.5 F (36.9 C) (Oral)  Resp 18  Ht 6\' 1"  (1.854 m)  Wt 209 lb 3.5 oz (94.9 kg)  BMI 27.61 kg/m2  SpO2 96% General: Well developed, well appearing 63 year old male in no acute distress. Neck: Supple. JVD not elevated. Lungs: Clear bilaterally to auscultation without wheezes, rales, or rhonchi. Breathing is unlabored. Heart: Regular S1 S2 without murmurs, rubs, or gallops.  Abdomen: Soft, non-distended. Extremities: No clubbing or cyanosis. No edema.  Neuro: Alert and oriented X 3. Moves all extremities spontaneously. No focal deficits. Skin: Left upper chest / implant site intact without bleeding or hematoma.  Intake/Output:  Intake/Output Summary (Last 24 hours) at 03/17/13 1126 Last data filed at 03/17/13 0900  Gross per 24 hour  Intake    782 ml  Output      0 ml  Net    782 ml    Inpatient Medications:  . metoprolol tartrate  25 mg Oral BID  . Rivaroxaban  15 mg Oral BID WC  . [START ON 04/07/2013] rivaroxaban  20 mg Oral Q supper  . sodium chloride  3 mL Intravenous Q12H    Labs:  Recent Labs  03/16/13 0415 03/17/13 0258  WBC 5.4 6.1  HGB 13.9 14.3  HCT 40.4 41.1  MCV 87.8 87.4  PLT 220 218      Component Value Date/Time   NA 138 03/16/2013 0910   K 4.6 03/16/2013 0910   CL 100 03/16/2013 0910   CO2 25 03/16/2013 0910   GLUCOSE 118* 03/16/2013 0910   BUN 13 03/16/2013 0910   CREATININE 1.15 03/16/2013 0910   CALCIUM 9.7 03/16/2013 0910   GFRNONAA 66* 03/16/2013 0910   GFRAA 77* 03/16/2013 0910  Mg 1.8  Radiology/Studies: Dg Chest 2 View  03/11/2013   CLINICAL DATA:  Bradycardia. Left lower extremity swelling. Prior history of pulmonary embolism.  EXAM: CHEST  2 VIEW  COMPARISON:  CT ANGIO CHEST dated 12/04/2005; DG CHEST 2 VIEW dated 12/04/2005   FINDINGS: Cardiac silhouette mildly enlarged but stable. Hilar and mediastinal contours otherwise unremarkable. Focal airspace opacity in the right lower lobe. Lungs otherwise clear. Bronchovascular markings normal. Pulmonary vascularity normal. No pneumothorax. No pleural effusions. Visualized bony thorax intact.  IMPRESSION: 1. Atelectasis versus pneumonia involving the right lower lobe. 2. Stable mild cardiomegaly without pulmonary edema.   Electronically Signed   By: Evangeline Dakin M.D.   On: 03/11/2013 12:47   Ct Angio Chest W/cm &/or Wo Cm  03/11/2013   ADDENDUM REPORT: 03/11/2013 17:37  ADDENDUM: Critical Value/emergent results were called by telephone at the time of interpretation on 03/11/2013 at 5:36 PM to Dr. Vanita Panda , who verbally acknowledged these results.   Electronically Signed   By: Suzy Bouchard M.D.   On: 03/11/2013 17:37   03/11/2013   FINDINGS: There is a tubular filling defect within the proximal right lower lobe pulmonary artery consistent with acute pulmonary embolism. There is a small pulmonary infarction peripheral to this right lower lobe embolus (image 65, series 6. There is a filling defect within the proximal left lower lobe pulmonary artery (image 52, series 4. No evidence of right ventricular strain. Overall clot burden is moderate. The pattern of pulmonary emboli is similar to 12/03/2005 and slightly less  severe.  No axillary or supraclavicular lymphadenopathy. No mediastinal lymphadenopathy. No pericardial effusion. Esophagus is normal.  No evidence of pneumonia or pneumothorax. Right lower lobe pulmonary infarction.  Limited view of the upper abdomen is unremarkable. Limited view of the skeleton demonstrates no aggressive osseous lesion.  Review of the MIP images confirms the above findings.  IMPRESSION: 1. Bilateral lower lobe acute pulmonary emboli. Overall clot burden is moderate. 2. Small right lower lobe pulmonary infarction.  Electronically Signed: By: Suzy Bouchard  M.D. On: 03/11/2013 17:22    Echocardiogram: Study Conclusions - Left ventricle: The cavity size was mildly dilated. Wall thickness was increased in a pattern of mild LVH. Systolic function was normal. The estimated ejection fraction was in the range of 50% to 55%. Wall motion was normal; there were no regional wall motion abnormalities. Doppler parameters are consistent with high ventricular filling pressure. - Aortic valve: Mild regurgitation. - Right atrium: The atrium was mildly dilated.  Telemetry: A sensed V paced with rate 80-100 bpm; brief paroxysms of polymorphic VT Device interrogation: performed by industry yesterday - normal device function with underlying complete heart block with junctional escape, V rate 40   Assessment and Plan  1. Polymorphic VT - already tracking sinus rate at 80-100 bpm so cannot increase base rate - K 4.6, Mg 1.8  - continue BB and will up-titrate to 50 mg twice daily today - keep on telemetry 2. Complete heart block s/p PPM implant - normal device function 3. DVT / PE - now on Xarelto   Signed, EDMISTEN, BROOKE PA-C  pt having polymorphic VT NS in the setting of PVC and pausing  Not on any QT prolonging drugs and now on bb appropriately  No FHx for sudden death of which he is aaware  His device does not have algorithm to reduce post PVC pausing, ie rate smoothing. The propensity may be related to the bradycardia and if so should improve over time ( resolution of Twave memory )  I would like to watch in house for another day or so  Continue Rivaroxaban

## 2013-03-18 LAB — CBC
HCT: 40.1 % (ref 39.0–52.0)
Hemoglobin: 13.8 g/dL (ref 13.0–17.0)
MCH: 30.4 pg (ref 26.0–34.0)
MCHC: 34.4 g/dL (ref 30.0–36.0)
MCV: 88.3 fL (ref 78.0–100.0)
Platelets: 213 10*3/uL (ref 150–400)
RBC: 4.54 MIL/uL (ref 4.22–5.81)
RDW: 13.3 % (ref 11.5–15.5)
WBC: 5.9 10*3/uL (ref 4.0–10.5)

## 2013-03-18 MED ORDER — YOU HAVE A PACEMAKER BOOK
Freq: Once | Status: AC
  Filled 2013-03-18: qty 1

## 2013-03-18 MED ORDER — METOPROLOL TARTRATE 50 MG PO TABS: 50.0000 mg | ORAL_TABLET | Freq: Two times a day (BID) | ORAL | Status: DC

## 2013-03-18 NOTE — Progress Notes (Signed)
Family Medicine Teaching Service Attending Note  I interviewed and examined patient Philip Chavez and reviewed their tests and x-rays.  I discussed with Dr. Thomes Dinning and reviewed their note for today.  I agree with their assessment and plan.     Additionally  Ready for discharge

## 2013-03-18 NOTE — Progress Notes (Signed)
Patient: Philip Chavez Date of Encounter: 03/18/2013, 9:57 AM Admit date: 03/11/2013     Subjective  Philip Chavez has no complaints this AM.   Objective  Physical Exam: Vitals: BP 121/68  Pulse 87  Temp(Src) 97.9 F (36.6 C) (Oral)  Resp 18  Ht 6\' 1"  (1.854 m)  Wt 210 lb 1.6 oz (95.3 kg)  BMI 27.73 kg/m2  SpO2 96% General: Well developed, well appearing 63 year old male in no acute distress. Neck: Supple. JVD not elevated. Lungs: Clear bilaterally to auscultation without wheezes, rales, or rhonchi. Breathing is unlabored. Heart: Regular S1 S2 without murmurs, rubs, or gallops.  Abdomen: Soft, non-distended. Extremities: No clubbing or cyanosis. No edema.  Neuro: Alert and oriented X 3. Moves all extremities spontaneously. No focal deficits. Skin: Left upper chest / implant site intact without bleeding or hematoma.  Intake/Output:  Intake/Output Summary (Last 24 hours) at 03/18/13 0957 Last data filed at 03/17/13 1850  Gross per 24 hour  Intake    440 ml  Output      0 ml  Net    440 ml    Inpatient Medications:  . metoprolol tartrate  50 mg Oral BID  . Rivaroxaban  15 mg Oral BID WC  . [START ON 04/07/2013] rivaroxaban  20 mg Oral Q supper  . sodium chloride  3 mL Intravenous Q12H    Labs:  Recent Labs  03/17/13 0258 03/18/13 0337  WBC 6.1 5.9  HGB 14.3 13.8  HCT 41.1 40.1  MCV 87.4 88.3  PLT 218 213      Component Value Date/Time   NA 138 03/16/2013 0910   K 4.6 03/16/2013 0910   CL 100 03/16/2013 0910   CO2 25 03/16/2013 0910   GLUCOSE 118* 03/16/2013 0910   BUN 13 03/16/2013 0910   CREATININE 1.15 03/16/2013 0910   CALCIUM 9.7 03/16/2013 0910   GFRNONAA 66* 03/16/2013 0910   GFRAA 77* 03/16/2013 0910  Mg 1.8  Radiology/Studies: Dg Chest 2 View  03/11/2013   CLINICAL DATA:  Bradycardia. Left lower extremity swelling. Prior history of pulmonary embolism.  EXAM: CHEST  2 VIEW  COMPARISON:  CT ANGIO CHEST dated 12/04/2005; DG CHEST 2 VIEW dated 12/04/2005   FINDINGS: Cardiac silhouette mildly enlarged but stable. Hilar and mediastinal contours otherwise unremarkable. Focal airspace opacity in the right lower lobe. Lungs otherwise clear. Bronchovascular markings normal. Pulmonary vascularity normal. No pneumothorax. No pleural effusions. Visualized bony thorax intact.  IMPRESSION: 1. Atelectasis versus pneumonia involving the right lower lobe. 2. Stable mild cardiomegaly without pulmonary edema.   Electronically Signed   By: Evangeline Dakin M.D.   On: 03/11/2013 12:47   Ct Angio Chest W/cm &/or Wo Cm  03/11/2013   ADDENDUM REPORT: 03/11/2013 17:37  ADDENDUM: Critical Value/emergent results were called by telephone at the time of interpretation on 03/11/2013 at 5:36 PM to Dr. Vanita Panda , who verbally acknowledged these results.   Electronically Signed   By: Suzy Bouchard M.D.   On: 03/11/2013 17:37   03/11/2013   FINDINGS: There is a tubular filling defect within the proximal right lower lobe pulmonary artery consistent with acute pulmonary embolism. There is a small pulmonary infarction peripheral to this right lower lobe embolus (image 65, series 6. There is a filling defect within the proximal left lower lobe pulmonary artery (image 52, series 4. No evidence of right ventricular strain. Overall clot burden is moderate. The pattern of pulmonary emboli is similar to 12/03/2005 and slightly less  severe.  No axillary or supraclavicular lymphadenopathy. No mediastinal lymphadenopathy. No pericardial effusion. Esophagus is normal.  No evidence of pneumonia or pneumothorax. Right lower lobe pulmonary infarction.  Limited view of the upper abdomen is unremarkable. Limited view of the skeleton demonstrates no aggressive osseous lesion.  Review of the MIP images confirms the above findings.  IMPRESSION: 1. Bilateral lower lobe acute pulmonary emboli. Overall clot burden is moderate. 2. Small right lower lobe pulmonary infarction.  Electronically Signed: By: Suzy Bouchard  M.D. On: 03/11/2013 17:22    Echocardiogram: Study Conclusions - Left ventricle: The cavity size was mildly dilated. Wall thickness was increased in a pattern of mild LVH. Systolic function was normal. The estimated ejection fraction was in the range of 50% to 55%. Wall motion was normal; there were no regional wall motion abnormalities. Doppler parameters are consistent with high ventricular filling pressure. - Aortic valve: Mild regurgitation. - Right atrium: The atrium was mildly dilated.  Telemetry: A sensed V paced with rate 80-100 bpm; frequent PVCs Device interrogation: performed by industry - normal device function with underlying complete heart block with junctional escape, V rate 40   Assessment and Plan  1. Polymorphic VT - already tracking sinus rate at 80-100 bpm so cannot increase base rate - K 4.6, Mg 1.8  - not on any QT prolonging meds - continue BB; up-titrated yesterday to 50 mg twice daily  2. Complete heart block s/p PPM implant - normal device function 3. DVT / PE - now on Xarelto   Signed, EDMISTEN, BROOKE PA-C  EP Attending  Patient seen and examined. Agree with above exam, assessment and plan. Ok to discharge home. We will arrange followup.   [name].D.

## 2013-03-18 NOTE — Discharge Summary (Deleted)
[facility] Discharge Summary  Patient name: Philip Chavez Medical record number: [id number] Date of birth: 1950/04/02 Age: 63 y.o. Gender: male Date of Admission: 03/11/2013  Date of Discharge: 03/18/2013 Admitting Physician: Alveda Reasons, MD  Primary Care Provider: Pcp Not In System Consultants: Cardiology  Indication for Hospitalization: Bilateral PE, third degree heart block  Discharge Diagnoses/Problem List:  Third degree heart block Left LE DVT Bilateral moderate sized pulmonary embolism, with pulmonary infarction  Disposition: Home  Discharge Condition: Improved  Discharge Exam:  General: well appearing gentleman in NAD, sitting in chair watching TV Cardiovascular: Bradycardic. Regular. No murmurs, rubs, or gallops.  Respiratory: CTAB. No rales, rhonchi, or wheeze.  Abdomen: soft, nontender, nondistended.  Extremities: trace to 1+ BL LE edema, L>R  Skin: Warm, dry, intact.   Brief Hospital Course: Philip Chavez is a 63 year old gentleman who presented to the ED from urgent care for leg swelling/pain and arrhythmia. He was admitted to the cardiac stepdown unit with left LE DVT, bilateral moderate sized PE, and third degree heart block. In regards to the patient's PE, he remained asymptomatic with no difficulty breathing, no chest pain, and no SOB. He was initially placed on a heparin drip and this was transitioned to rivaroxiban on hospital day 1. Troponins were negative x3. Cardiology was consulted and recommended pacemaker placement, however the patient was initially hesitant. Per cardiology his third degree heart block could be a result of his acute PE, and thus the patient remained in the hospital to assess for spontaneous resolution. When this did not resolve on hospital day 4 the patient had a pacemaker placed by cardiology on 03/15/13. He tolerated the procedure well with no complications.  Following pacemaker placement the patient was noted to  have PVCs and short runs of polymrphic Vtach on telemetry. He was started on Metoprolol 25mg  BID on 03/16/13 and this was increased to 50mg  BID on 03/17/2013. He remained asymptomatic throughout these episodes. Pt continued to have occasional PCVs however his Vtach resolved. On 03/18/13 the patient was seen and evaluated and felt to be stable for discharge home with outpatient followup.  Issues for Follow Up: Lifelong anticoagulation with Xarelto. Dose change from 15mg  BID to 20mg  QD on 04/02/13  Significant Procedures: Medtronic dual-chamber pacemaker placed  Significant Labs and Imaging:   Recent Labs Lab 03/16/13 0415 03/17/13 0258 03/18/13 0337  WBC 5.4 6.1 5.9  HGB 13.9 14.3 13.8  HCT 40.4 41.1 40.1  PLT 220 218 213    Recent Labs Lab 03/15/13 0441 03/16/13 0910  NA 137 138  K 4.1 4.6  CL 99 100  CO2 25 25  GLUCOSE 97 118*  BUN 14 13  CREATININE 1.14 1.15  CALCIUM 9.2 9.7  MG  --  1.8   ECHO:  Study Conclusions - Left ventricle: The cavity size was mildly dilated. Wall thickness was increased in a pattern of mild LVH. Systolic function was normal. The estimated ejection fraction was in the range of 50% to 55%. Wall motion was normal; there were no regional wall motion abnormalities. Doppler parameters are consistent with high ventricular filling pressure. - Aortic valve: Mild regurgitation. - Right atrium: The atrium was mildly dilated   Results/Tests Pending at Time of Discharge:   Discharge Medications:    Medication List         metoprolol tartrate 25 MG tablet  Commonly known as:  LOPRESSOR  Take 1 tablet (25 mg total) by mouth 2 (two) times daily.  naproxen sodium 220 MG tablet  Commonly known as:  ANAPROX  Take 440 mg by mouth daily as needed (for pain).     Rivaroxaban 15 MG Tabs tablet  Commonly known as:  XARELTO  Take 1 tablet (15 mg total) by mouth 2 (two) times daily with a meal.     Rivaroxaban 20 MG Tabs tablet  Commonly known as:   XARELTO  Take 1 tablet (20 mg total) by mouth daily with supper.  Start taking on:  04/07/2013     traMADol 50 MG tablet  Commonly known as:  ULTRAM  Take 50 mg by mouth daily as needed for moderate pain.        Discharge Instructions: Please refer to Patient Instructions section of EMR for full details.  Patient was counseled important signs and symptoms that should prompt return to medical care, changes in medications, dietary instructions, activity restrictions, and follow up appointments.   Follow-Up Appointments:  Follow-up Information   Follow up with [facility] On 03/25/2013. (At 9:00 AM for wound check)    Specialty:  Cardiology   Contact information:   [address], [place] [address] [phone]      Follow up with [name], MD On 06/17/2013. (At 2:00 PM)    Specialty:  Cardiology   Contact information:   [address] [place] [address] [phone]       Follow up with Thersa Salt, DO On 03/31/2013. (10:45 am)    Specialty:  Family Medicine   Contact information:   [place] [address] [place], [place]

## 2013-03-18 NOTE — Discharge Summary (Signed)
I have reviewed this discharge summary and agree.    

## 2013-03-18 NOTE — Progress Notes (Signed)
Family Medicine Teaching Service Attending Note  I discussed patient Philip Chavez  with Dr. Lacinda Axon and reviewed their note for today.  I agree with their assessment and plan.

## 2013-03-18 NOTE — Progress Notes (Signed)
Family Medicine Teaching Service Daily Progress Note Intern Pager: [phone]  Patient name: Philip Chavez Medical record number: [id number] Date of birth: 06/13/1950 Age: 63 y.o. Gender: male  Primary Care Provider: Pcp Not In System Consultants: Cardiology Code Status: Full code   Pt Overview and Major Events to Date:  2/5 - Admitted after being evaluated at [facility] for LLE swelling and pain. 2/5 - CT angio revealed bilateral PE's with RLL infarction (small); LE dopper revealed deep vein thrombosis involving the left common femoral vein, left profunda femoris vein,left femoral vein, left popliteal vein, and left posterial tibial vein. Patient also noted be in 3rd degree heart block.  Started on Heparin gtt initially then placed on Xarelto 2/6 - Cardiology recommending staying over the weekend 2/7-2/8 - Remains in 3rd degree Heart Block, c/o back pain 2/9- Dual chamber pacemaker placed 2/10- runs of Vtach on tele. Metoprolol 25mg  started 2/11- Metoprolol increased to 50mg  PO BID 2/12- Pt discharged  Assessment and Plan:  Philip Chavez is a 63 y.o. male presenting with 5 day history of left swollen leg and found to have DVT extending from posterior tibial vein to common femoral vein & bilateral PE with RLL infarct, also new diagnosis of 3rd degree AV block. 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  # LLE DVT & Bilateral PE: ultrasound confirmed DVT of left leg in posterior tibial, popliteal, femoral, common femoral veins; CFV DVT appears to be mobile. Denies SOB or CP, but CT angio PE shows bilateral lower lobe acute PE (moderate burden) and small RLL pulmonary infarct.On Xarelto. Lab workup otherwise normal, no leukocytosis or anemia, trop neg x 4. Has previous workup for hypercoagulability in 2008 showing negative FVL, prothrombin gene mutation, antithrombin III level normal.  - Will continue Xarelto 15mg  BID x21 days, then 20mg  QD; Patient will likely need lifelong anticoagulation  given 2nd occurrence.  # Complete AV block - Received dual chamber pacemaker on 2/9.  - Increase metoprolol to 50mg  BID per cardiology for PVC's apparently from LV - stable for discharge per electrophysiology  # Neck pain - Resolved.  FEN/GI: heart healthy diet, saline lock iv  Prophylaxis: Xarelto for DVT/PE treatment  Disposition: Discharge today  Subjective:  No complaints today, reports he feels, no pain at incision site. No difficulty breathing or SOB.  Objective: Temp:  [97.7 F (36.5 C)-98.5 F (36.9 C)] 97.9 F (36.6 C) (02/12 0752) Pulse Rate:  [81-94] 87 (02/12 0752) Resp:  [18-20] 18 (02/12 0752) BP: (121-154)/(68-89) 121/68 mmHg (02/12 0752) SpO2:  [92 %-98 %] 96 % (02/12 0752) Weight:  [210 lb 1.6 oz (95.3 kg)] 210 lb 1.6 oz (95.3 kg) (02/11 2359)  Physical Exam: General: well appearing gentleman in NAD, sitting in chair watching TV Cardiovascular: Regular rate and rhythm No murmurs, rubs, or gallops.  Respiratory: CTAB. No rales, rhonchi, or wheeze.  Abdomen: soft, nontender, nondistended.  Extremities: trace to 1+ BL LE edema, L>R Skin: Warm, dry, intact. Pacemaker pocket in left upper chest wall appears dry and nonerythematous.   Laboratory:  Recent Labs Lab 03/16/13 0415 03/17/13 0258 03/18/13 0337  WBC 5.4 6.1 5.9  HGB 13.9 14.3 13.8  HCT 40.4 41.1 40.1  PLT 220 218 213    Recent Labs Lab 03/15/13 0441 03/16/13 0910  NA 137 138  K 4.1 4.6  CL 99 100  CO2 25 25  BUN 14 13  CREATININE 1.14 1.15  CALCIUM 9.2 9.7  GLUCOSE 97 118*    Imaging/Diagnostic Tests: ECHO:  Study Conclusions - Left ventricle: The cavity size was mildly dilated. Wall thickness was increased in a pattern of mild LVH. Systolic function was normal. The estimated ejection fraction was in the range of 50% to 55%. Wall motion was normal; there were no regional wall motion abnormalities. Doppler parameters are consistent with high ventricular filling pressure. -  Aortic valve: Mild regurgitation. - Right atrium: The atrium was mildly dilated  [place] Resident, MS4  03/18/2013, 12:20 PM Pager [phone]  Teaching Service Addendum. I have seen and evaluated this pt and agree with assessment and plan as it is documented on this note.   My exam is as follows:   Gen:  No in acute distress. Cooperative with physical exam. HEENT: Moist mucous membranes. PERLA CV: Regular rate and rhythm, no murmurs rubs or gallops. PULM: Clear to auscultation bilaterally. No wheezes/rales or rhonchi Chest: pacemaker pocket in left upper chest wall without erythema or drainage.  ABD: Soft, non tender, non distended, normal bowel sounds.  EXT: good pulses. Bilateral trace edema. Left LE more pronounced than right.  Neuro: Grossly intact. No neurologic focalization.   D. [place] Philippa Sicks, MD Family Medicine  PGY-3

## 2013-03-23 ENCOUNTER — Encounter (HOSPITAL_COMMUNITY): Payer: Self-pay | Admitting: *Deleted

## 2013-03-25 ENCOUNTER — Ambulatory Visit (INDEPENDENT_AMBULATORY_CARE_PROVIDER_SITE_OTHER): Payer: BC Managed Care – PPO | Admitting: *Deleted

## 2013-03-25 DIAGNOSIS — I442 Atrioventricular block, complete: Secondary | ICD-10-CM

## 2013-03-25 LAB — MDC_IDC_ENUM_SESS_TYPE_INCLINIC
Battery Impedance: 100 Ohm
Battery Remaining Longevity: 111 mo
Battery Voltage: 2.79 V
Brady Statistic AP VP Percent: 4 %
Brady Statistic AP VS Percent: 0 %
Brady Statistic AS VP Percent: 94 %
Date Time Interrogation Session: 20150219122145
Lead Channel Pacing Threshold Amplitude: 0.5 V
Lead Channel Pacing Threshold Amplitude: 1 V
Lead Channel Pacing Threshold Pulse Width: 0.4 ms
Lead Channel Sensing Intrinsic Amplitude: 2.8 mV
Lead Channel Setting Pacing Amplitude: 3.5 V
Lead Channel Setting Pacing Amplitude: 3.5 V
Lead Channel Setting Pacing Pulse Width: 0.4 ms
MDC IDC MSMT LEADCHNL RA IMPEDANCE VALUE: 533 Ohm
MDC IDC MSMT LEADCHNL RV IMPEDANCE VALUE: 527 Ohm
MDC IDC MSMT LEADCHNL RV PACING THRESHOLD PULSEWIDTH: 0.4 ms
MDC IDC MSMT LEADCHNL RV SENSING INTR AMPL: 5.6 mV
MDC IDC SET LEADCHNL RV SENSING SENSITIVITY: 4 mV
MDC IDC STAT BRADY AS VS PERCENT: 1 %

## 2013-03-25 NOTE — Progress Notes (Signed)
Wound check appointment. Steri-strips removed. Wound without redness or edema. Incision edges approximated, wound well healed. Normal device function. Thresholds, sensing, and impedances consistent with implant measurements. Device programmed at 3.5V for extra safety margin until 3 month visit. Histogram distribution appropriate for patient and level of activity. 9 mode switches (<0.1%)---all <30 sec, no EGMs. 1 high ventricular rate noted on 2-10, 109/248 bpm x 3 sec---brief NSVT. Patient educated about wound care, arm mobility, lifting restrictions. ROV in 3 months with GT.

## 2013-03-31 ENCOUNTER — Inpatient Hospital Stay: Payer: BC Managed Care – PPO | Admitting: Family Medicine

## 2013-03-31 ENCOUNTER — Other Ambulatory Visit: Payer: Self-pay

## 2013-03-31 ENCOUNTER — Telehealth: Payer: Self-pay

## 2013-03-31 MED ORDER — METOPROLOL TARTRATE 50 MG PO TABS: 50.0000 mg | ORAL_TABLET | Freq: Two times a day (BID) | ORAL | Status: DC

## 2013-03-31 MED ORDER — RIVAROXABAN 20 MG PO TABS: 20.0000 mg | ORAL_TABLET | Freq: Every day | ORAL | Status: DC

## 2013-03-31 NOTE — Telephone Encounter (Signed)
Patient called need samples of xarelto placed samples up front

## 2013-04-02 ENCOUNTER — Encounter: Payer: Self-pay | Admitting: Internal Medicine

## 2013-04-06 ENCOUNTER — Inpatient Hospital Stay: Payer: BC Managed Care – PPO | Admitting: Family Medicine

## 2013-04-13 ENCOUNTER — Encounter: Payer: Self-pay | Admitting: Family Medicine

## 2013-04-13 ENCOUNTER — Ambulatory Visit (INDEPENDENT_AMBULATORY_CARE_PROVIDER_SITE_OTHER): Payer: BC Managed Care – PPO | Admitting: Family Medicine

## 2013-04-13 VITALS — BP 130/64 | HR 82 | Temp 99.1°F | Ht 73.0 in | Wt 209.0 lb

## 2013-04-13 DIAGNOSIS — I442 Atrioventricular block, complete: Secondary | ICD-10-CM

## 2013-04-13 DIAGNOSIS — Z0000 Encounter for general adult medical examination without abnormal findings: Secondary | ICD-10-CM

## 2013-04-13 DIAGNOSIS — Z23 Encounter for immunization: Secondary | ICD-10-CM

## 2013-04-13 DIAGNOSIS — I2699 Other pulmonary embolism without acute cor pulmonale: Secondary | ICD-10-CM

## 2013-04-13 DIAGNOSIS — Z136 Encounter for screening for cardiovascular disorders: Secondary | ICD-10-CM

## 2013-04-13 DIAGNOSIS — IMO0001 Reserved for inherently not codable concepts without codable children: Secondary | ICD-10-CM

## 2013-04-13 DIAGNOSIS — Z13220 Encounter for screening for lipoid disorders: Secondary | ICD-10-CM

## 2013-04-13 LAB — LIPID PANEL
Cholesterol: 228 mg/dL — ABNORMAL HIGH (ref 0–200)
HDL: 48 mg/dL (ref >39)
LDL Cholesterol: 144 mg/dL — ABNORMAL HIGH (ref 0–99)
TRIGLYCERIDES: 178 mg/dL — AB (ref <150)
Total CHOL/HDL Ratio: 4.8 Ratio
VLDL: 36 mg/dL (ref 0–40)

## 2013-04-13 MED ORDER — TRAMADOL HCL 50 MG PO TABS: 50.0000 mg | ORAL_TABLET | Freq: Four times a day (QID) | ORAL | Status: DC | PRN

## 2013-04-13 MED ORDER — RIVAROXABAN 20 MG PO TABS: 20.0000 mg | ORAL_TABLET | Freq: Every day | ORAL | Status: DC

## 2013-04-13 NOTE — Progress Notes (Signed)
   Subjective:    Patient ID: Philip Chavez, male    DOB: [birth year], 63 y.o.   MRN: [id number]  HPI 63 year old male with a history of DVT & PE and recent hospital admission for DVT & PE (2/5 - 2/12) as well as 3rd degree heart block presents for hospital follow up and to establish care.  1) DVT (LLE) & PE - Patient continues to have RLE swelling - He is compliant with anticoagulation; he is currently on Xarelto 20 mg daily.  - No bleeding or bruising.  No hemoptysis.  No SOB.  He reports he is feeling well today.  2) 3rd degree heart block; Hx of polymorphic VTach and PVC's - s/p Pacemaker placement.  Pacemaker site is well healed. - Patient currently doing well and is asymptomatic. - Patient is on Metoprolol 50 mg BID for PVC's and VTach.  He is tolerating this well. - No SOB, chest pain, dizziness/lightheadedness.  Review of Systems Per HPI    Objective:   Physical Exam Filed Vitals:   04/13/13 1419  BP: 130/64  Pulse: 82  Temp: 99.1 F (37.3 C)    Exam: General: well built, well nourished male in NAD.  Cardiovascular: RRR. No murmurs, rubs, or gallops. Respiratory: CTAB. No rales, rhonchi, or wheeze. Extremities: LLE - 1+ edema.  No calf tenderness.  Significantly larger than RLE.      Assessment & Plan:  See Problem List

## 2013-04-13 NOTE — Assessment & Plan Note (Signed)
Patient due for Tdap.  This was given. Patient reported that he had a colonoscopy in his 75's.  I asked him to find out when/where so this could be documented and for appropriate follow up testing. Given age, will screen for HLD today. Patient has had recent lab testing during recent hospitalization.

## 2013-04-13 NOTE — Assessment & Plan Note (Signed)
Given second DVT and PE, this warrants lifelong anticoagulation. Patient will continue Xarelto 20 mg daily. Refilled today.

## 2013-04-13 NOTE — Patient Instructions (Signed)
It was great to see you today.  Continue taking your Xarelto - 20 mg daily.  I will send you a letter or call with the results of your cholesterol.  Please try and find out when (and where) you had your colonoscopy done.   Follow up with me in 6 months - 1 year.

## 2013-04-13 NOTE — Assessment & Plan Note (Signed)
Doing well. Patient with follow up with cardiology 06/2013.

## 2013-04-14 ENCOUNTER — Encounter: Payer: Self-pay | Admitting: Family Medicine

## 2013-06-16 ENCOUNTER — Other Ambulatory Visit: Payer: Self-pay

## 2013-06-17 ENCOUNTER — Encounter: Payer: Self-pay | Admitting: Internal Medicine

## 2013-06-17 ENCOUNTER — Ambulatory Visit (INDEPENDENT_AMBULATORY_CARE_PROVIDER_SITE_OTHER): Payer: BC Managed Care – PPO | Admitting: Internal Medicine

## 2013-06-17 VITALS — BP 100/60 | HR 92 | Ht 73.0 in | Wt 209.2 lb

## 2013-06-17 DIAGNOSIS — Z950 Presence of cardiac pacemaker: Secondary | ICD-10-CM

## 2013-06-17 DIAGNOSIS — I442 Atrioventricular block, complete: Secondary | ICD-10-CM

## 2013-06-17 LAB — MDC_IDC_ENUM_SESS_TYPE_INCLINIC
Battery Impedance: 100 Ohm
Battery Remaining Longevity: 108 mo
Battery Voltage: 2.79 V
Brady Statistic AP VP Percent: 2 %
Brady Statistic AP VS Percent: 0 %
Brady Statistic AS VS Percent: 3 %
Date Time Interrogation Session: 20150514184236
Lead Channel Impedance Value: 456 Ohm
Lead Channel Impedance Value: 465 Ohm
Lead Channel Pacing Threshold Amplitude: 1 V
Lead Channel Pacing Threshold Pulse Width: 0.4 ms
Lead Channel Pacing Threshold Pulse Width: 0.4 ms
Lead Channel Sensing Intrinsic Amplitude: 4 mV
Lead Channel Setting Pacing Amplitude: 2.5 V
Lead Channel Setting Pacing Pulse Width: 0.4 ms
Lead Channel Setting Sensing Sensitivity: 4 mV
MDC IDC MSMT LEADCHNL RV PACING THRESHOLD AMPLITUDE: 0.75 V
MDC IDC MSMT LEADCHNL RV SENSING INTR AMPL: 8 mV
MDC IDC SET LEADCHNL RA PACING AMPLITUDE: 2 V
MDC IDC STAT BRADY AS VP PERCENT: 95 %

## 2013-06-17 NOTE — Patient Instructions (Signed)
Your physician wants you to follow-up in: 03/22/14 with Dr Knox Saliva will receive a reminder letter in the mail two months in advance. If you don't receive a letter, please call our office to schedule the follow-up appointment.    Remote monitoring is used to monitor your Pacemaker or ICD from home. This monitoring reduces the number of office visits required to check your device to one time per year. It allows [place] to keep an eye on the functioning of your device to ensure it is working properly. You are scheduled for a device check from home on 09/20/13. You may send your transmission at any time that day. If you have a wireless device, the transmission will be sent automatically. After your physician reviews your transmission, you will receive a postcard with your next transmission date.

## 2013-06-17 NOTE — Assessment & Plan Note (Signed)
His Medtronic DDD PM is working normally. Will recheck in several months. 

## 2013-06-17 NOTE — Progress Notes (Signed)
      HPI [name] returns today for followup. He is a 63 yo man with a h/o complete heart block and HTN, s/p PPM insertion. He has done well with no chest pain or sob. No syncope. He remains physically active. No Known Allergies   Current Outpatient Prescriptions  Medication Sig Dispense Refill  . meloxicam (MOBIC) 15 MG tablet Take 1 tablet by mouth 2 (two) times daily.      . metoprolol (LOPRESSOR) 50 MG tablet Take 1 tablet (50 mg total) by mouth 2 (two) times daily.  60 tablet  3  . Rivaroxaban (XARELTO) 20 MG TABS tablet Take 1 tablet (20 mg total) by mouth daily with supper.  90 tablet  3   No current facility-administered medications for this visit.     Past Medical History  Diagnosis Date  . DVT (deep venous thrombosis) 2007; 03/2013    RLE: S/P foot OR; LLE  . Embolism, pulmonary with infarction 2007; 03/2013    S/P foot OR; bilateral  . Complete AV block     s/p MDT pacemaker  . Third degree heart block   . Hypercoagulable state   . Musculoskeletal neck pain     ROS:   All systems reviewed and negative except as noted in the HPI.   Past Surgical History  Procedure Laterality Date  . Hernia repair  1980's    "in my stomach"  . Foot fracture surgery Right 2007  . Pacemaker insertion  03/16/2013    MDT ADDRL1 pacemaker implanted by Dr Lovena Le for complete heart block     No family history on file.   History   Social History  . Marital Status: Married    Spouse Name: N/A    Number of Children: N/A  . Years of Education: N/A   Occupational History  . Not on file.   Social History Main Topics  . Smoking status: Former Smoker -- 1.50 packs/day for 10 years    Types: Cigarettes  . Smokeless tobacco: Never Used     Comment: 03/16/2013 "quit smoking 20-30 yr ago"  . Alcohol Use: No  . Drug Use: No  . Sexual Activity: Yes   Other Topics Concern  . Not on file   Social History Narrative  . No narrative on file     BP 100/60  Pulse 92  Ht 6'  1" (1.854 m)  Wt 209 lb 3.2 oz (94.892 kg)  BMI 27.61 kg/m2  Physical Exam:  Well appearing middle aged man,NAD HEENT: Unremarkable Neck:  No JVD, no thyromegally Back:  No CVA tenderness Lungs:  Clear with no wheezes, well healed PPM incision. HEART:  Regular rate rhythm, no murmurs, no rubs, no clicks Abd:  soft, positive bowel sounds, no organomegally, no rebound, no guarding Ext:  2 plus pulses, no edema, no cyanosis, no clubbing Skin:  No rashes no nodules Neuro:  CN II through XII intact, motor grossly intact  EKG  DEVICE  Normal device function.  See PaceArt for details.   Assess/Plan:

## 2013-06-22 ENCOUNTER — Encounter: Payer: Self-pay | Admitting: Internal Medicine

## 2013-07-07 ENCOUNTER — Ambulatory Visit (INDEPENDENT_AMBULATORY_CARE_PROVIDER_SITE_OTHER): Payer: BC Managed Care – PPO | Admitting: Family Medicine

## 2013-07-07 ENCOUNTER — Telehealth: Payer: Self-pay | Admitting: Internal Medicine

## 2013-07-07 ENCOUNTER — Ambulatory Visit (HOSPITAL_COMMUNITY)
Admission: RE | Admit: 2013-07-07 | Discharge: 2013-07-07 | Disposition: A | Discharge status: Home / Self Care | Payer: BC Managed Care – PPO | Source: Ambulatory Visit | Attending: Family Medicine | Admitting: Family Medicine

## 2013-07-07 ENCOUNTER — Encounter: Payer: Self-pay | Admitting: Family Medicine

## 2013-07-07 VITALS — BP 141/91 | HR 92 | Temp 98.0°F | Ht 73.0 in | Wt 210.0 lb

## 2013-07-07 DIAGNOSIS — R0989 Other specified symptoms and signs involving the circulatory and respiratory systems: Secondary | ICD-10-CM

## 2013-07-07 DIAGNOSIS — I442 Atrioventricular block, complete: Secondary | ICD-10-CM

## 2013-07-07 DIAGNOSIS — I82409 Acute embolism and thrombosis of unspecified deep veins of unspecified lower extremity: Secondary | ICD-10-CM

## 2013-07-07 DIAGNOSIS — J309 Allergic rhinitis, unspecified: Secondary | ICD-10-CM

## 2013-07-07 DIAGNOSIS — R0609 Other forms of dyspnea: Secondary | ICD-10-CM

## 2013-07-07 DIAGNOSIS — R0602 Shortness of breath: Secondary | ICD-10-CM

## 2013-07-07 DIAGNOSIS — R0600 Dyspnea, unspecified: Secondary | ICD-10-CM | POA: Insufficient documentation

## 2013-07-07 DIAGNOSIS — G4700 Insomnia, unspecified: Secondary | ICD-10-CM

## 2013-07-07 LAB — CBC
HEMATOCRIT: 45.1 % (ref 39.0–52.0)
HEMOGLOBIN: 15.1 g/dL (ref 13.0–17.0)
MCH: 30.4 pg (ref 26.0–34.0)
MCHC: 33.5 g/dL (ref 30.0–36.0)
MCV: 90.7 fL (ref 78.0–100.0)
Platelets: 194 10*3/uL (ref 150–400)
RBC: 4.97 MIL/uL (ref 4.22–5.81)
RDW: 14.9 % (ref 11.5–15.5)
WBC: 5.8 10*3/uL (ref 4.0–10.5)

## 2013-07-07 MED ORDER — FLUTICASONE PROPIONATE 50 MCG/ACT NA SUSP: 1.0000 | Freq: Every day | NASAL | Status: DC

## 2013-07-07 NOTE — Patient Instructions (Signed)
See me or Dr. Lacinda Axon in one week I will call with the lab results. Use the new nasal spray every day. Stay on your same medications, especially the Xarelto.

## 2013-07-07 NOTE — Telephone Encounter (Signed)
Patient would like something called in for congestion. Please call and advise.

## 2013-07-07 NOTE — Telephone Encounter (Signed)
Called patient to advise to call his PCP and he was at the office

## 2013-07-08 ENCOUNTER — Ambulatory Visit (HOSPITAL_COMMUNITY)
Admission: RE | Admit: 2013-07-08 | Discharge: 2013-07-08 | Disposition: A | Discharge status: Home / Self Care | Payer: BC Managed Care – PPO | Source: Ambulatory Visit | Attending: Family Medicine | Admitting: Family Medicine

## 2013-07-08 DIAGNOSIS — G4700 Insomnia, unspecified: Secondary | ICD-10-CM | POA: Insufficient documentation

## 2013-07-08 DIAGNOSIS — R0600 Dyspnea, unspecified: Secondary | ICD-10-CM

## 2013-07-08 DIAGNOSIS — I824Y9 Acute embolism and thrombosis of unspecified deep veins of unspecified proximal lower extremity: Secondary | ICD-10-CM | POA: Insufficient documentation

## 2013-07-08 DIAGNOSIS — I82409 Acute embolism and thrombosis of unspecified deep veins of unspecified lower extremity: Secondary | ICD-10-CM

## 2013-07-08 DIAGNOSIS — I824Z9 Acute embolism and thrombosis of unspecified deep veins of unspecified distal lower extremity: Secondary | ICD-10-CM | POA: Insufficient documentation

## 2013-07-08 LAB — COMPLETE METABOLIC PANEL WITH GFR
ALBUMIN: 3.9 g/dL (ref 3.5–5.2)
ALT: 42 U/L (ref 0–53)
AST: 21 U/L (ref 0–37)
Alkaline Phosphatase: 77 U/L (ref 39–117)
BUN: 24 mg/dL — ABNORMAL HIGH (ref 6–23)
CO2: 28 mEq/L (ref 19–32)
Calcium: 9 mg/dL (ref 8.4–10.5)
Chloride: 109 mEq/L (ref 96–112)
Creat: 1.06 mg/dL (ref 0.50–1.35)
GFR, Est African American: 87 mL/min
GFR, Est Non African American: 75 mL/min
Glucose, Bld: 93 mg/dL (ref 70–99)
POTASSIUM: 4.1 meq/L (ref 3.5–5.3)
Sodium: 141 mEq/L (ref 135–145)
TOTAL PROTEIN: 6.6 g/dL (ref 6.0–8.3)
Total Bilirubin: 0.4 mg/dL (ref 0.2–1.2)

## 2013-07-08 MED ORDER — ZOLPIDEM TARTRATE 10 MG PO TABS: 10.0000 mg | ORAL_TABLET | Freq: Every evening | ORAL | Status: DC | PRN

## 2013-07-08 NOTE — Progress Notes (Signed)
Left lower extremity venous duplex completed.  Left: DVT noted in the femoral, popliteal, posterior tibial, and peroneal veins.  No evidence of superficial thrombosis.  No Baker's cyst.  Right:  Negative for DVT in the common femoral vein.

## 2013-07-08 NOTE — Progress Notes (Signed)
   Subjective:    Patient ID: Philip Chavez, male    DOB: [birth year], 63 y.o.   MRN: [id number]  HPI  Patient complains of shortness of breath.  No fever.  Some nasal congestion.  He occaisionally hears wheezing.  Very remote smoker.  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  Had pulm infarct with PE.  Never dxed with COPD, never PFTs  Concerned about insomnia.  Persistant left leg swelling - site of previous DVT.  Religiously taking xarelto.    Review of Systems     Objective:   Physical Exam Boggy nasal mucosa. Lungs clear, no wheeze, seems to have good vital capacity. Left leg definitely bigger than right.  2+ pitting edema.       Assessment & Plan:

## 2013-07-08 NOTE — Assessment & Plan Note (Signed)
Doubt new DVT since compliant with xarelto, still check venous dopplers.

## 2013-07-08 NOTE — Assessment & Plan Note (Signed)
Could he have occult COPD?  Or is he just the "worried well" Next step would be to obtain PFTs

## 2013-07-08 NOTE — Assessment & Plan Note (Signed)
Given his irregular heartbeat, checked EKG which showed pacer with PVCs.  No change in therapy.

## 2013-07-08 NOTE — Assessment & Plan Note (Signed)
Add zolpidem.  Again, anxiety appears to be playing a role.

## 2013-07-08 NOTE — Assessment & Plan Note (Signed)
Add flonase to see if it improves his SOB.

## 2013-07-14 ENCOUNTER — Telehealth: Payer: Self-pay | Admitting: Family Medicine

## 2013-07-14 ENCOUNTER — Emergency Department (HOSPITAL_COMMUNITY): Payer: BC Managed Care – PPO

## 2013-07-14 ENCOUNTER — Inpatient Hospital Stay (HOSPITAL_COMMUNITY)
Admission: EM | Admit: 2013-07-14 | Discharge: 2013-07-19 | DRG: 292 | Disposition: A | Discharge status: Home / Self Care | Payer: BC Managed Care – PPO | Attending: Family Medicine | Admitting: Family Medicine

## 2013-07-14 ENCOUNTER — Telehealth: Payer: Self-pay | Admitting: Internal Medicine

## 2013-07-14 ENCOUNTER — Encounter (HOSPITAL_COMMUNITY): Payer: Self-pay | Admitting: Emergency Medicine

## 2013-07-14 DIAGNOSIS — I471 Supraventricular tachycardia: Secondary | ICD-10-CM

## 2013-07-14 DIAGNOSIS — Z950 Presence of cardiac pacemaker: Secondary | ICD-10-CM

## 2013-07-14 DIAGNOSIS — I442 Atrioventricular block, complete: Secondary | ICD-10-CM

## 2013-07-14 DIAGNOSIS — J309 Allergic rhinitis, unspecified: Secondary | ICD-10-CM | POA: Diagnosis present

## 2013-07-14 DIAGNOSIS — R0600 Dyspnea, unspecified: Secondary | ICD-10-CM | POA: Diagnosis present

## 2013-07-14 DIAGNOSIS — I5021 Acute systolic (congestive) heart failure: Secondary | ICD-10-CM | POA: Diagnosis present

## 2013-07-14 DIAGNOSIS — Z7982 Long term (current) use of aspirin: Secondary | ICD-10-CM

## 2013-07-14 DIAGNOSIS — I4892 Unspecified atrial flutter: Secondary | ICD-10-CM | POA: Diagnosis present

## 2013-07-14 DIAGNOSIS — Z7901 Long term (current) use of anticoagulants: Secondary | ICD-10-CM

## 2013-07-14 DIAGNOSIS — I2789 Other specified pulmonary heart diseases: Secondary | ICD-10-CM | POA: Diagnosis present

## 2013-07-14 DIAGNOSIS — I10 Essential (primary) hypertension: Secondary | ICD-10-CM | POA: Diagnosis present

## 2013-07-14 DIAGNOSIS — M25519 Pain in unspecified shoulder: Secondary | ICD-10-CM | POA: Diagnosis present

## 2013-07-14 DIAGNOSIS — I82409 Acute embolism and thrombosis of unspecified deep veins of unspecified lower extremity: Secondary | ICD-10-CM

## 2013-07-14 DIAGNOSIS — M25579 Pain in unspecified ankle and joints of unspecified foot: Secondary | ICD-10-CM | POA: Diagnosis present

## 2013-07-14 DIAGNOSIS — G8929 Other chronic pain: Secondary | ICD-10-CM | POA: Diagnosis present

## 2013-07-14 DIAGNOSIS — Z86711 Personal history of pulmonary embolism: Secondary | ICD-10-CM | POA: Diagnosis present

## 2013-07-14 DIAGNOSIS — I509 Heart failure, unspecified: Secondary | ICD-10-CM | POA: Diagnosis present

## 2013-07-14 DIAGNOSIS — Z79899 Other long term (current) drug therapy: Secondary | ICD-10-CM

## 2013-07-14 DIAGNOSIS — I420 Dilated cardiomyopathy: Secondary | ICD-10-CM

## 2013-07-14 DIAGNOSIS — I428 Other cardiomyopathies: Secondary | ICD-10-CM | POA: Diagnosis present

## 2013-07-14 DIAGNOSIS — I2782 Chronic pulmonary embolism: Secondary | ICD-10-CM | POA: Diagnosis present

## 2013-07-14 DIAGNOSIS — G4700 Insomnia, unspecified: Secondary | ICD-10-CM | POA: Diagnosis present

## 2013-07-14 DIAGNOSIS — Z86718 Personal history of other venous thrombosis and embolism: Secondary | ICD-10-CM

## 2013-07-14 DIAGNOSIS — I2699 Other pulmonary embolism without acute cor pulmonale: Secondary | ICD-10-CM

## 2013-07-14 DIAGNOSIS — Z87891 Personal history of nicotine dependence: Secondary | ICD-10-CM

## 2013-07-14 DIAGNOSIS — Z23 Encounter for immunization: Secondary | ICD-10-CM

## 2013-07-14 DIAGNOSIS — I498 Other specified cardiac arrhythmias: Secondary | ICD-10-CM | POA: Diagnosis present

## 2013-07-14 HISTORY — DX: Essential (primary) hypertension: I10

## 2013-07-14 LAB — BASIC METABOLIC PANEL
BUN: 18 mg/dL (ref 6–23)
CO2: 20 mEq/L (ref 19–32)
CREATININE: 1.17 mg/dL (ref 0.50–1.35)
Calcium: 9.2 mg/dL (ref 8.4–10.5)
Chloride: 100 mEq/L (ref 96–112)
GFR calc non Af Amer: 65 mL/min — ABNORMAL LOW (ref >90)
GFR, EST AFRICAN AMERICAN: 75 mL/min — AB (ref >90)
GLUCOSE: 116 mg/dL — AB (ref 70–99)
Potassium: 4.2 mEq/L (ref 3.7–5.3)
Sodium: 137 mEq/L (ref 137–147)

## 2013-07-14 LAB — CBC WITH DIFFERENTIAL/PLATELET
BASOS PCT: 0 % (ref 0–1)
Basophils Absolute: 0 10*3/uL (ref 0.0–0.1)
EOS ABS: 0 10*3/uL (ref 0.0–0.7)
Eosinophils Relative: 0 % (ref 0–5)
HEMATOCRIT: 42.8 % (ref 39.0–52.0)
HEMOGLOBIN: 14.7 g/dL (ref 13.0–17.0)
LYMPHS ABS: 1.8 10*3/uL (ref 0.7–4.0)
Lymphocytes Relative: 26 % (ref 12–46)
MCH: 31.1 pg (ref 26.0–34.0)
MCHC: 34.3 g/dL (ref 30.0–36.0)
MCV: 90.7 fL (ref 78.0–100.0)
MONO ABS: 0.7 10*3/uL (ref 0.1–1.0)
MONOS PCT: 10 % (ref 3–12)
Neutro Abs: 4.3 10*3/uL (ref 1.7–7.7)
Neutrophils Relative %: 64 % (ref 43–77)
Platelets: 191 10*3/uL (ref 150–400)
RBC: 4.72 MIL/uL (ref 4.22–5.81)
RDW: 14.1 % (ref 11.5–15.5)
WBC: 6.8 10*3/uL (ref 4.0–10.5)

## 2013-07-14 LAB — I-STAT TROPONIN, ED: Troponin i, poc: 0.02 ng/mL (ref 0.00–0.08)

## 2013-07-14 LAB — TROPONIN I: Troponin I: <0.3 ng/mL (ref <0.30)

## 2013-07-14 LAB — PRO B NATRIURETIC PEPTIDE: Pro B Natriuretic peptide (BNP): 7574 pg/mL — ABNORMAL HIGH (ref 0–125)

## 2013-07-14 MED ORDER — METOPROLOL TARTRATE 50 MG PO TABS
50.0000 mg | ORAL_TABLET | Freq: Two times a day (BID) | ORAL | Status: DC
  Administered 2013-07-14: 50 mg via ORAL
  Filled 2013-07-14 (×3): qty 1

## 2013-07-14 MED ORDER — IOHEXOL 350 MG/ML SOLN
100.0000 mL | Freq: Once | INTRAVENOUS | Status: AC | PRN
  Administered 2013-07-14: 100 mL via INTRAVENOUS

## 2013-07-14 MED ORDER — FUROSEMIDE 10 MG/ML IJ SOLN
40.0000 mg | Freq: Once | INTRAMUSCULAR | Status: AC
  Administered 2013-07-14: 40 mg via INTRAVENOUS
  Filled 2013-07-14: qty 4

## 2013-07-14 MED ORDER — FUROSEMIDE 10 MG/ML IJ SOLN
40.0000 mg | Freq: Two times a day (BID) | INTRAMUSCULAR | Status: DC
  Administered 2013-07-15: 40 mg via INTRAVENOUS
  Filled 2013-07-14 (×3): qty 4

## 2013-07-14 MED ORDER — MORPHINE SULFATE 2 MG/ML IJ SOLN: 2.0000 mg | INTRAMUSCULAR | Status: DC | PRN

## 2013-07-14 MED ORDER — ASPIRIN EC 81 MG PO TBEC
81.0000 mg | DELAYED_RELEASE_TABLET | Freq: Every day | ORAL | Status: DC
  Administered 2013-07-14: 81 mg via ORAL
  Filled 2013-07-14 (×2): qty 1

## 2013-07-14 MED ORDER — ATORVASTATIN CALCIUM 80 MG PO TABS
80.0000 mg | ORAL_TABLET | Freq: Every day | ORAL | Status: DC
  Administered 2013-07-14 – 2013-07-18 (×5): 80 mg via ORAL
  Filled 2013-07-14 (×6): qty 1

## 2013-07-14 MED ORDER — ACETAMINOPHEN 500 MG PO TABS: 1000.0000 mg | ORAL_TABLET | Freq: Four times a day (QID) | ORAL | Status: DC | PRN

## 2013-07-14 MED ORDER — ZOLPIDEM TARTRATE 5 MG PO TABS
10.0000 mg | ORAL_TABLET | Freq: Every evening | ORAL | Status: DC | PRN
  Administered 2013-07-14 – 2013-07-18 (×5): 10 mg via ORAL
  Filled 2013-07-14 (×5): qty 2

## 2013-07-14 MED ORDER — PNEUMOCOCCAL VAC POLYVALENT 25 MCG/0.5ML IJ INJ
0.5000 mL | INJECTION | INTRAMUSCULAR | Status: AC
  Administered 2013-07-15: 0.5 mL via INTRAMUSCULAR
  Filled 2013-07-14: qty 0.5

## 2013-07-14 MED ORDER — SODIUM CHLORIDE 0.9 % IJ SOLN
INTRAMUSCULAR | Status: AC
  Filled 2013-07-14: qty 1250

## 2013-07-14 MED ORDER — RIVAROXABAN 20 MG PO TABS
20.0000 mg | ORAL_TABLET | Freq: Every day | ORAL | Status: DC
  Filled 2013-07-14: qty 1

## 2013-07-14 NOTE — ED Notes (Addendum)
Pt presents to department for evaluation of R sided chest pain radiating to R arm, also states SOB. History of pulmonary embolism. States he feels like his heart is beating too fast. 7/10 pain at the time. Also states bilateral lower extremity swelling. Onset of symptoms yesterday while at home. Pt is alert and oriented x4. Speaking complete sentences. Pt does have pacemaker.

## 2013-07-14 NOTE — Telephone Encounter (Signed)
Having severe pain in right shoulder and shortness of breath "would like to talk to a dr so he can call in a RX"

## 2013-07-14 NOTE — ED Provider Notes (Signed)
CSN: [id number]     Arrival date & time 07/14/13  1418 History   First MD Initiated Contact with Patient 07/14/13 1454     Chief Complaint  Patient presents with  . Shortness of Breath  . Arm Pain  . Chest Pain     HPI Pt was seen at 1505. Per pt and his family, c/o gradual onset and persistence of constant right sided chest "pain" for the past 2 to 3 days. Describes the pain as radiating into his right shoulder and arm. Has been associated with SOB, palpitations, and increasing pedal edema. Denies cough, no fever, no abd pain, no N/V/D, no back pain.    Past Medical History  Diagnosis Date  . DVT (deep venous thrombosis) 2007; 03/2013    RLE: S/P foot OR; LLE  . Embolism, pulmonary with infarction 2007; 03/2013    S/P foot OR; bilateral  . Complete AV block     s/p MDT pacemaker  . Third degree heart block   . Hypercoagulable state   . Musculoskeletal neck pain   . Hypertension    Past Surgical History  Procedure Laterality Date  . Hernia repair  1980's    "in my stomach"  . Foot fracture surgery Right 2007  . Pacemaker insertion  03/16/2013    MDT ADDRL1 pacemaker implanted by Dr Lovena Le for complete heart block   History reviewed. No pertinent family history. History  Substance Use Topics  . Smoking status: Former Smoker -- 1.50 packs/day for 10 years    Types: Cigarettes  . Smokeless tobacco: Never Used     Comment: 03/16/2013 "quit smoking 20-30 yr ago"  . Alcohol Use: No    Review of Systems ROS: Statement: All systems negative except as marked or noted in the HPI; Constitutional: Negative for fever and chills. ; ; Eyes: Negative for eye pain, redness and discharge. ; ; ENMT: Negative for ear pain, hoarseness, nasal congestion, sinus pressure and sore throat. ; ; Cardiovascular: Negative for diaphoresis, +CP, palpitations, dyspnea and peripheral edema. ; ; Respiratory: Negative for cough, wheezing and stridor. ; ; Gastrointestinal: Negative for nausea, vomiting,  diarrhea, abdominal pain, blood in stool, hematemesis, jaundice and rectal bleeding. . ; ; Genitourinary: Negative for dysuria, flank pain and hematuria. ; ; Musculoskeletal: Negative for back pain and neck pain. Negative for swelling and trauma.; ; Skin: Negative for pruritus, rash, abrasions, blisters, bruising and skin lesion.; ; Neuro: Negative for headache, lightheadedness and neck stiffness. Negative for weakness, altered level of consciousness , altered mental status, extremity weakness, paresthesias, involuntary movement, seizure and syncope.        Allergies  Review of patient's allergies indicates no known allergies.  Home Medications   Prior to Admission medications   Medication Sig Start Date End Date Taking? Authorizing Provider  acetaminophen (TYLENOL) 500 MG tablet Take 1,000 mg by mouth every 6 (six) hours as needed for mild pain.   Yes Historical Provider, MD  fluticasone (FLONASE) 50 MCG/ACT nasal spray Place 1 spray into both nostrils daily. 07/07/13  Yes Zigmund Gottron, MD  meloxicam (MOBIC) 15 MG tablet Take 1 tablet by mouth daily.  06/04/13  Yes Historical Provider, MD  metoprolol (LOPRESSOR) 50 MG tablet Take 1 tablet (50 mg total) by mouth 2 (two) times daily. 03/31/13  Yes Evans Lance, MD  Rivaroxaban (XARELTO) 20 MG TABS tablet Take 1 tablet (20 mg total) by mouth daily with supper. 04/13/13  Yes Coral Spikes, DO  zolpidem (AMBIEN) 10  MG tablet Take 1 tablet (10 mg total) by mouth at bedtime as needed for sleep. 07/08/13 08/07/13 Yes Zigmund Gottron, MD   BP 121/86  Pulse 92  Temp(Src) 98.1 F (36.7 C) (Oral)  Resp 19  SpO2 96% Physical Exam 1510: Physical examination:  Nursing notes reviewed; Vital signs and O2 SAT reviewed;  Constitutional: Well developed, Well nourished, Well hydrated, In no acute distress; Head:  Normocephalic, atraumatic; Eyes: EOMI, PERRL, No scleral icterus; ENMT: Mouth and pharynx normal, Mucous membranes moist; Neck: Supple, Full  range of motion, No lymphadenopathy; Cardiovascular: Regular rate and rhythm, No gallop; Respiratory: Breath sounds coarse & equal bilaterally, No wheezes.  Speaking full sentences with ease, Normal respiratory effort/excursion; Chest: Nontender, Movement normal; Abdomen: Soft, Nontender, Nondistended, Normal bowel sounds; Genitourinary: No CVA tenderness; Extremities: Pulses normal, No tenderness, +2 pedal edema bilat without calf asymmetry.; Neuro: AA&Ox3, Major CN grossly intact.  Speech clear. No gross focal motor or sensory deficits in extremities.; Skin: Color normal, Warm, Dry.   ED Course  Procedures     EKG Interpretation   Date/Time:  Wednesday July 14 2013 14:30:20 EDT Ventricular Rate:  113 PR Interval:  176 QRS Duration: 162 QT Interval:  334 QTC Calculation: 458 R Axis:   -76 Text Interpretation:  Atrial-sensed ventricular-paced rhythm Abnormal ECG  No significant change was found Confirmed by CAMPOS  MD, Lennette Bihari ([id number]) on  07/14/2013 2:39:31 PM      MDM  MDM Reviewed: previous chart, nursing note and vitals Reviewed previous: labs and ECG Interpretation: labs, ECG, x-ray and CT scan   Results for orders placed during the hospital encounter of 07/14/13  CBC WITH DIFFERENTIAL      Result Value Ref Range   WBC 6.8  4.0 - 10.5 K/uL   RBC 4.72  4.22 - 5.81 MIL/uL   Hemoglobin 14.7  13.0 - 17.0 g/dL   HCT 42.8  39.0 - 52.0 %   MCV 90.7  78.0 - 100.0 fL   MCH 31.1  26.0 - 34.0 pg   MCHC 34.3  30.0 - 36.0 g/dL   RDW 14.1  11.5 - 15.5 %   Platelets 191  150 - 400 K/uL   Neutrophils Relative % 64  43 - 77 %   Neutro Abs 4.3  1.7 - 7.7 K/uL   Lymphocytes Relative 26  12 - 46 %   Lymphs Abs 1.8  0.7 - 4.0 K/uL   Monocytes Relative 10  3 - 12 %   Monocytes Absolute 0.7  0.1 - 1.0 K/uL   Eosinophils Relative 0  0 - 5 %   Eosinophils Absolute 0.0  0.0 - 0.7 K/uL   Basophils Relative 0  0 - 1 %   Basophils Absolute 0.0  0.0 - 0.1 K/uL  BASIC METABOLIC PANEL       Result Value Ref Range   Sodium 137  137 - 147 mEq/L   Potassium 4.2  3.7 - 5.3 mEq/L   Chloride 100  96 - 112 mEq/L   CO2 20  19 - 32 mEq/L   Glucose, Bld 116 (*) 70 - 99 mg/dL   BUN 18  6 - 23 mg/dL   Creatinine, Ser 1.17  0.50 - 1.35 mg/dL   Calcium 9.2  8.4 - 10.5 mg/dL   GFR calc non Af Amer 65 (*) >90 mL/min   GFR calc Af Amer 75 (*) >90 mL/min  PRO B NATRIURETIC PEPTIDE      Result Value Ref Range  Pro B Natriuretic peptide (BNP) 7574.0 (*) 0 - 125 pg/mL  I-STAT TROPOININ, ED      Result Value Ref Range   Troponin i, poc 0.02  0.00 - 0.08 ng/mL   Comment 3            Dg Chest 2 View 07/14/2013   CLINICAL DATA:  Shortness of breath.  Right-sided chest pain  EXAM: CHEST  2 VIEW  COMPARISON:  03/16/2013  FINDINGS: There is a left chest wall pacer device with lead in the right atrial appendage and right ventricle. There is mild cardiac enlargement. Bilateral pleural effusions and pulmonary vascular congestion is identified. No airspace consolidation.  IMPRESSION: 1. Mild CHF.   Electronically Signed   By: Kerby Moors M.D.   On: 07/14/2013 15:59    1730:  T/C from [facility] MD: states CT-A chest with small PE's. Pt already taking xarelto. BNP elevated with CHF on CXR; will dose IV lasix. Dx and testing d/w pt and family.  Questions answered.  Verb understanding, agreeable to admit. T/C to [facility] Resident, case discussed, including:  HPI, pertinent PM/SHx, VS/PE, dx testing, ED course and treatment:  Agreeable to admit, requests they will come to ED for evaluation.    Alfonzo Feller, DO 07/16/13 1610

## 2013-07-14 NOTE — Telephone Encounter (Signed)
Spoke with patient and he has been having pain in his right shoulder and is now SOB.  I have asked him to call Dr Andria Frames to be seen as he has a history of DVT.  I gave him the number and asked him to call.  He should go to the ER if the pain becomes worse

## 2013-07-14 NOTE — H&P (Signed)
[facility] Admission History and Physical Service Pager: [phone]  Patient name: Philip Chavez Medical record number: [id number] Date of birth: 07-20-1950 Age: 63 y.o. Gender: male  Primary Care Provider: Thersa Salt, DO Consultants: None Code Status: Full  Chief Complaint: Chest pain, shortness of breath  Assessment and Plan: Philip Chavez is a 63 y.o. male presenting with shortness of breath. 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   Acute CHF: Pro-BNP elevated to 7574, some pulmonary edema with effusions as well as borderline cardiomegaly on CXR, recent symptoms of CHF. EF 50-55% with mild LVH and elevated filling pressures on echo Feb 2015. 216lbs on presentation. Pt reports dry weight around 207 lbs, office records indicate stable 209 lbs.  - Diuresis with lasix 40mg  IV BID.  - Daily weights, strict I/O - Repeat ECG in AM - Cycle troponins (POC: 0.02 in ED) - Glucose 116, no diagnosis of diabetes. Check Hb A1c - Morphine 2 mg IV q2h prn chest pain  - Aspirin 81mg  - O2 by [place] prn hypoxemia - Consider repeat echocardiogram given clinical change.  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 Evidence of multiple small clots on CTA.  - Continue xarelto 20mg  daily - Telemetry, pulse oximetry - Hypercoagulability workup 2008: negative FVL, prothrombin gene mutation, antithrombin III level normal.   Shoulder pain: Prefer to treat without NSAID, so giving tylenol for pain as this has worked in the past. Consider steroid injection if able as this has worked in the past. HTN: Normotensive in ED, continue home metoprolol 50mg  BID. Will likely need addition of low dose ACE-i if BP will tolerate. Allergic rhinitis: Continue flonase Insomnia: Will hold ambien given SOB at  this time  FEN/GI: Saline lock IV, heart healthy (low sodium) diet Prophylaxis: On xarelto  Disposition: Admit to telemetry on FMTS, Dr. Andria Frames attending.   History of Present Illness: Philip Chavez is a 63 y.o. male presenting with chest pain, shortness of breath, and symmetric leg swelling for the past 2-3 days.   Philip Chavez reports difficulty breathing for the past couple days associated with right-sided chest pain that is worse when taking deep breaths. He has been having difficulty sleeping for the past 2 weeks, requiring at least 3 pillows to lay supine. His legs have been increasingly swelling and this morning his heart also felt like it was beating quickly. He's had a non-productive cough during this time as well.   He also complains of right shoulder pain. He has had right shoulder pain since 2007 when he fell off a roof onto his right side. He's been taking NSAIDs for this. He reports 100% medication adherence. He reports getting a cortisone shot in the past which helped significantly.  Reports having some subjective fevers last week with some nausea, vomiting, and diarrhea. He denies HA, vision changes, hearing changes, rhinorrhea, sore throat, wheezing, rash, myalgias.    Review Of Systems: Per HPI Otherwise 12 point review of systems was performed and was unremarkable.  Patient Active Problem List   Diagnosis Date Noted  . Insomnia 07/08/2013  . Allergic rhinitis 07/07/2013  . Dyspnea 07/07/2013  . Pacemaker 06/17/2013  . Preventative health care 04/13/2013  . Embolism, pulmonary with infarction 03/12/2013  . Third degree heart block 03/12/2013  . DVT (deep venous thrombosis) 03/11/2013  Past Medical History: Past Medical History  Diagnosis Date  . DVT (deep venous thrombosis) 2007; 03/2013    RLE: S/P foot OR; LLE  . Embolism, pulmonary with infarction 2007; 03/2013    S/P foot OR; bilateral  . Complete AV block     s/p MDT pacemaker  . Third degree heart block    . Hypercoagulable state   . Musculoskeletal neck pain   . Hypertension    Past Surgical History: Past Surgical History  Procedure Laterality Date  . Hernia repair  1980's    "in my stomach"  . Foot fracture surgery Right 2007  . Pacemaker insertion  03/16/2013    MDT ADDRL1 pacemaker implanted by Dr Lovena Le for complete heart block   Social History: History  Substance Use Topics  . Smoking status: Former Smoker -- 1.50 packs/day for 10 years    Types: Cigarettes  . Smokeless tobacco: Never Used     Comment: 03/16/2013 "quit smoking 20-30 yr ago"  . Alcohol Use: No   Additional social history: Lives with wife. History of smoking x10 years, quit x20 years. No EtOH or illicit drugs.  Please also refer to relevant sections of EMR.  Family History: History reviewed. No pertinent family history. Allergies and Medications: No Known Allergies No current facility-administered medications on file prior to encounter.   Current Outpatient Prescriptions on File Prior to Encounter  Medication Sig Dispense Refill  . fluticasone (FLONASE) 50 MCG/ACT nasal spray Place 1 spray into both nostrils daily.  16 g  6  . meloxicam (MOBIC) 15 MG tablet Take 1 tablet by mouth daily.       . metoprolol (LOPRESSOR) 50 MG tablet Take 1 tablet (50 mg total) by mouth 2 (two) times daily.  60 tablet  3  . Rivaroxaban (XARELTO) 20 MG TABS tablet Take 1 tablet (20 mg total) by mouth daily with supper.  90 tablet  3  . zolpidem (AMBIEN) 10 MG tablet Take 1 tablet (10 mg total) by mouth at bedtime as needed for sleep.  15 tablet  1    Objective: BP 121/86  Pulse 92  Temp(Src) 98.1 F (36.7 C) (Oral)  Resp 19  SpO2 96% Exam: General: Pleasant, well-appearing 63 y.o. male sitting on bed in NAD HEENT: MMM, PERRL, EOMI, oropharynx clear Cardiovascular: Regular rate, no murmur, S3 or S4 noted. +hepatojugular reflux. 2+ radial and DP pulses. Respiratory: Non-labored on room air, increased rate, bibasilar  crackles. Intermittent cough. Abdomen: +BS, soft, NT, NT Extremities: WWP, 2+ bilateral pitting edema of legs. Minimal calf pain with compression.   Skin: Brawny discoloration of shins bilaterally. No wounds, ulcers, rashes.  Neuro: Alert and oriented, speech normal, gait normal.   Labs and Imaging: CBC BMET   Recent Labs Lab 07/14/13 1427  WBC 6.8  HGB 14.7  HCT 42.8  PLT 191    Recent Labs Lab 07/14/13 1427  NA 137  K 4.2  CL 100  CO2 20  BUN 18  CREATININE 1.17  GLUCOSE 116*  CALCIUM 9.2      Recent Labs Lab 07/14/13 1629  PROBNP 7574.0*   2D ECHOCARDIOGRAM 03/12/2013 - Left ventricle: The cavity size was mildly dilated. Wall thickness was increased in a pattern of mild LVH. Systolic function was normal. The estimated ejection fraction was in the range of 50% to 55%. Wall motion was normal; there were no regional wall motion abnormalities. Doppler parameters are consistent with high ventricular filling pressure. - Aortic valve: Mild regurgitation. - Right  atrium: The atrium was mildly dilated.  LOWER EXTREMITY DOPPLER 07/08/2013 Findings consistent with acute deep vein thrombosis involving the left femoral, popliteal, posterior tibial, and peroneal veins. The DVT noted in the left common femoral and profunda veins on 03-11-13 appear resolved.  CXR 07/14/2013 There is a left chest wall pacer device with lead in the right  atrial appendage and right ventricle. There is mild cardiac  enlargement. Bilateral pleural effusions and pulmonary vascular  congestion is identified. No airspace consolidation.  EKG: paced rhythm  Philip Gather, MD 07/14/2013, 5:39 PM PGY-1, [place] Intern pager: [phone], text pages welcome  PGY-3 Addendum I have seen and examined this patient.  I agree with the above note, with my edits in pink.  Philip Chavez 07/14/2013, 7:39 PM

## 2013-07-14 NOTE — Telephone Encounter (Signed)
Patient advised to schedule an appointment if we have no openings to go to urgent care.Philip Chavez, Philip Chavez

## 2013-07-14 NOTE — ED Notes (Signed)
Pt in Xray not brought back to treatment room at this time.

## 2013-07-14 NOTE — Telephone Encounter (Signed)
New message  Pt called states that he is having severe pain in his right shoulder/ Requests a call back to discuss//SR

## 2013-07-15 DIAGNOSIS — I2699 Other pulmonary embolism without acute cor pulmonale: Secondary | ICD-10-CM

## 2013-07-15 DIAGNOSIS — I509 Heart failure, unspecified: Secondary | ICD-10-CM

## 2013-07-15 DIAGNOSIS — Z950 Presence of cardiac pacemaker: Secondary | ICD-10-CM

## 2013-07-15 DIAGNOSIS — I82409 Acute embolism and thrombosis of unspecified deep veins of unspecified lower extremity: Secondary | ICD-10-CM

## 2013-07-15 DIAGNOSIS — I442 Atrioventricular block, complete: Secondary | ICD-10-CM

## 2013-07-15 DIAGNOSIS — I369 Nonrheumatic tricuspid valve disorder, unspecified: Secondary | ICD-10-CM

## 2013-07-15 LAB — HEMOGLOBIN A1C
HEMOGLOBIN A1C: 6 % — AB (ref <5.7)
MEAN PLASMA GLUCOSE: 126 mg/dL — AB (ref <117)

## 2013-07-15 LAB — BASIC METABOLIC PANEL
BUN: 17 mg/dL (ref 6–23)
CO2: 24 meq/L (ref 19–32)
Calcium: 8.9 mg/dL (ref 8.4–10.5)
Chloride: 102 mEq/L (ref 96–112)
Creatinine, Ser: 1.24 mg/dL (ref 0.50–1.35)
GFR calc Af Amer: 70 mL/min — ABNORMAL LOW (ref >90)
GFR calc non Af Amer: 61 mL/min — ABNORMAL LOW (ref >90)
Glucose, Bld: 125 mg/dL — ABNORMAL HIGH (ref 70–99)
Potassium: 3.8 mEq/L (ref 3.7–5.3)
SODIUM: 141 meq/L (ref 137–147)

## 2013-07-15 LAB — TROPONIN I: Troponin I: <0.3 ng/mL (ref <0.30)

## 2013-07-15 MED ORDER — RIVAROXABAN 20 MG PO TABS
20.0000 mg | ORAL_TABLET | Freq: Every day | ORAL | Status: AC
  Administered 2013-07-15: 20 mg via ORAL
  Filled 2013-07-15: qty 1

## 2013-07-15 MED ORDER — RIVAROXABAN 15 MG PO TABS
15.0000 mg | ORAL_TABLET | Freq: Two times a day (BID) | ORAL | Status: DC
  Administered 2013-07-16 – 2013-07-18 (×5): 15 mg via ORAL
  Administered 2013-07-18: 11:00:00 via ORAL
  Administered 2013-07-19: 15 mg via ORAL
  Filled 2013-07-15 (×9): qty 1

## 2013-07-15 MED ORDER — FUROSEMIDE 10 MG/ML IJ SOLN
20.0000 mg | Freq: Every day | INTRAMUSCULAR | Status: DC
  Administered 2013-07-16 – 2013-07-17 (×2): 20 mg via INTRAVENOUS
  Filled 2013-07-15 (×3): qty 2

## 2013-07-15 MED ORDER — METOPROLOL TARTRATE 50 MG PO TABS
75.0000 mg | ORAL_TABLET | Freq: Two times a day (BID) | ORAL | Status: DC
  Administered 2013-07-15 – 2013-07-19 (×8): 75 mg via ORAL
  Filled 2013-07-15 (×9): qty 1

## 2013-07-15 NOTE — Progress Notes (Signed)
  Echocardiogram 2D Echocardiogram has been performed.  Philip Chavez 07/15/2013, 11:49 AM

## 2013-07-15 NOTE — Progress Notes (Signed)
Seen and examined.  Discussed with Dr. Grunz.  Agree with his documentation and management.  Please see my cosign of the H&PE for my thoughts for today. 

## 2013-07-15 NOTE — Progress Notes (Signed)
Family Medicine Teaching Service Daily Progress Note Intern Pager: [phone]  Patient name: Philip Chavez Medical record number: [id number] Date of birth: December 19, 1950 Age: 63 y.o. Gender: male  Primary Care Provider: Thersa Salt, DO Consultants: None Code Status: Full  Pt Overview and Major Events to Date:  6/10: Admitted for acute CHF; wt: 216 lbs. 6/11: Wt: 207 lbs.  Assessment and Plan: Philip Chavez is a 63 y.o. male presenting with shortness of breath. 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   Acute CHF: Pro-BNP: 7574, some pulmonary edema with effusions as well as borderline cardiomegaly on CXR. EF 50-55% with mild LVH and elevated filling pressures on echo Feb 2015.  - Diuresis with lasix 40mg  IV BID: K down mildly 4.2 > 3.8. Cr up slightly 1.17 > 1.24.  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 Multiple small clots without evidence of right heart strain on CTA.  - Continue xarelto 20mg  daily, telemetry, pulse oximetry  Shoulder pain: Prefer to treat without NSAID, so giving tylenol for pain as this has worked in the past. Steroid injection effective in the past, so could consider if recurs.  HTN: Normotensive, continue home metoprolol 50mg  BID, likely add low-dose ACE Allergic rhinitis: Continue flonase  Insomnia: Holding ambien  FEN/GI: Saline lock IV, heart healthy (low sodium) diet  Prophylaxis: On xarelto  Disposition: Likely discharge with close follow up today  Subjective:  Feels 100% better. Breathing better. Shoulder better. No chest  pain. Legs have gone down.   Objective: Temp:  [97.5 F (36.4 C)-98.3 F (36.8 C)] 98.3 F (36.8 C) (06/11 0607) Pulse Rate:  [82-112] 90 (06/11 0607) Resp:  [18-26] 20 (06/11 0607) BP: (107-138)/(76-104) 118/86 mmHg (06/11 0607) SpO2:  [95 %-100 %] 99 % (06/11 0607) Weight:  [207 lb 10.8 oz (94.2 kg)-216 lb (97.977 kg)] 207 lb 10.8 oz (94.2 kg) (06/11 9381) Physical Exam: General: Pleasant 63 y.o. male in NAD  Cardiovascular: Regular rate, no murmur, S3 or S4 noted. +hepatojugular reflux. 2+ radial and DP pulses.  Respiratory: Non-labored on room air, increased rate, bibasilar crackles. Intermittent cough.  Abdomen: +BS, soft, NT, NT  Extremities: WWP, 2+ bilateral pitting edema of legs. Minimal calf pain with compression.   Laboratory:  Recent Labs Lab 07/14/13 1427  WBC 6.8  HGB 14.7  HCT 42.8  PLT 191    Recent Labs Lab 07/14/13 1427 07/15/13 0217  NA 137 141  K 4.2 3.8  CL 100 102  CO2 20 24  BUN 18 17  CREATININE 1.17 1.24  CALCIUM 9.2 8.9  GLUCOSE 116* 125*   PROBNP  7574.0*    2D ECHOCARDIOGRAM 03/12/2013  - Left ventricle: The cavity size was mildly dilated. Wall thickness was increased in a pattern of mild LVH. Systolic function was normal. The estimated ejection fraction was in the range of 50% to 55%. Wall motion was normal; there were no regional wall motion abnormalities. Doppler parameters are consistent with high ventricular filling pressure. - Aortic valve: Mild regurgitation. - Right atrium: The  atrium was mildly dilated.   Imaging/Diagnostic Tests: LOWER EXTREMITY DOPPLER 07/08/2013  Findings consistent with acute deep vein thrombosis involving the left femoral, popliteal, posterior tibial, and peroneal veins. The DVT noted in the left common femoral and profunda veins on 03-11-13 appear resolved.   CXR 07/14/2013  There is a left chest wall pacer device with lead in the right  atrial appendage and right ventricle. There is mild cardiac   enlargement. Bilateral pleural effusions and pulmonary vascular  congestion is identified. No airspace consolidation.  EKG: paced rhythm  CT CHEST ANGIOGRAM Subsegmental level right lower lobe pulmonary emboli. No larger  pulmonary emboli.  Evidence of a degree of congestive heart failure. Right base  consolidation is present. Pacemaker leads are attached to the right  atrium and right ventricle.  No apparent right heart strain.  Vance Gather, MD 07/15/2013, 8:11 AM PGY-1, [place] Intern pager: [phone], text pages welcome

## 2013-07-15 NOTE — Progress Notes (Signed)
UR completed. Patient changed to inpatient- requiring IV lasix BID  

## 2013-07-15 NOTE — Consult Note (Signed)
CARDIOLOGY CONSULT NOTE     Patient ID: Philip Chavez MRN: [id number] DOB/AGE: Oct 09, 1950 63 y.o.  Admit date: 07/14/2013 Referring Physician Franchot Mimes MD Primary Physician Thersa Salt, DO Primary Cardiologist [name] MD Reason for Consultation CHF  HPI: Philip Chavez is a pleasant 63 yo BM we are asked to see for evaluation of new onset CHF. He reports a history of DVT /PE in 2007/08 following ankle surgery. He was admitted in February 2015 with recurrent proximal femoral DVT and PE. He has been on Xarelto 20 mg daily. He developed complete heart block during that admission and had a pacemaker placed. Echo showed EF 50-55%. Over the last 3 weeks he has noted increased swelling in his legs bilaterally left greater than right. Yesterday he became acutely SOB. He also described acute pain in his right shoulder that was worse with deep breathing. He complains of fatigue and feeling hot. Hypercoaguability work up in 2008 was reportedly normal. No known history of CAD or CHF in the past. Does take meloxicam daily for his ankle. Reports compliance with medication.  Past Medical History  Diagnosis Date  . DVT (deep venous thrombosis) 2007; 03/2013    RLE: S/P foot OR; LLE  . Embolism, pulmonary with infarction 2007; 03/2013    S/P foot OR; bilateral  . Complete AV block     s/p MDT pacemaker  . Third degree heart block   . Hypercoagulable state   . Musculoskeletal neck pain   . Hypertension     History reviewed. No pertinent family history.  History   Social History  . Marital Status: Married    Spouse Name: N/A    Number of Children: N/A  . Years of Education: N/A   Occupational History  . Not on file.   Social History Main Topics  . Smoking status: Former Smoker -- 1.50 packs/day for 10 years    Types: Cigarettes  . Smokeless tobacco: Never Used     Comment: 03/16/2013 "quit smoking 20-30 yr ago"  . Alcohol Use: No  . Drug Use: No  . Sexual Activity: Yes   Other Topics  Concern  . Not on file   Social History Narrative  . No narrative on file    Past Surgical History  Procedure Laterality Date  . Hernia repair  1980's    "in my stomach"  . Foot fracture surgery Right 2007  . Pacemaker insertion  03/16/2013    MDT ADDRL1 pacemaker implanted by Dr Lovena Le for complete heart block     Prescriptions prior to admission  Medication Sig Dispense Refill  . acetaminophen (TYLENOL) 500 MG tablet Take 1,000 mg by mouth every 6 (six) hours as needed for mild pain.      . fluticasone (FLONASE) 50 MCG/ACT nasal spray Place 1 spray into both nostrils daily.  16 g  6  . meloxicam (MOBIC) 15 MG tablet Take 1 tablet by mouth daily.       . metoprolol (LOPRESSOR) 50 MG tablet Take 1 tablet (50 mg total) by mouth 2 (two) times daily.  60 tablet  3  . Rivaroxaban (XARELTO) 20 MG TABS tablet Take 1 tablet (20 mg total) by mouth daily with supper.  90 tablet  3  . zolpidem (AMBIEN) 10 MG tablet Take 1 tablet (10 mg total) by mouth at bedtime as needed for sleep.  15 tablet  1       ROS: As noted in HPI. All other systems are reviewed and are negative  unless otherwise mentioned.   Physical Exam: Blood pressure 118/86, pulse 90, temperature 98.3 F (36.8 C), temperature source Oral, resp. rate 20, height 6\' 1"  (1.854 m), weight 207 lb 10.8 oz (94.2 kg), SpO2 99.00%. Current Weight  07/15/13 207 lb 10.8 oz (94.2 kg)  07/07/13 210 lb (95.255 kg)  06/17/13 209 lb 3.2 oz (94.892 kg)    GENERAL:  Well appearing BM in NAD HEENT:  PERRL, EOMI, sclera are clear. Oropharynx is clear. NECK:  JVD to 6 sm, carotid upstroke brisk and symmetric, no bruits, no thyromegaly or adenopathy LUNGS:  Clear to auscultation bilaterally CHEST:  Unremarkable HEART:  IRRR,  PMI not displaced or sustained,S1 and S2 within normal limits, no S3, no S4: no clicks, no rubs, no murmurs ABD:  Soft, nontender. BS +, no masses or bruits. No hepatomegaly, no splenomegaly EXT:  2 + pulses throughout,  2-3 + edema of left leg to thigh. Minimal edema on the right. SKIN:  Warm and dry.  No rashes NEURO:  Alert and oriented x 3. Cranial nerves II through XII intact. PSYCH:  Cognitively intact    Labs:   Lab Results  Component Value Date   WBC 6.8 07/14/2013   HGB 14.7 07/14/2013   HCT 42.8 07/14/2013   MCV 90.7 07/14/2013   PLT 191 07/14/2013    Recent Labs Lab 07/15/13 0217  NA 141  K 3.8  CL 102  CO2 24  BUN 17  CREATININE 1.24  CALCIUM 8.9  GLUCOSE 125*   Lab Results  Component Value Date   TROPONINI <0.30 07/15/2013   TROPONINI <0.30 07/15/2013   TROPONINI <0.30 07/14/2013    Lab Results  Component Value Date   CHOL 228* 04/13/2013   Lab Results  Component Value Date   HDL 48 04/13/2013   Lab Results  Component Value Date   LDLCALC 144* 04/13/2013   Lab Results  Component Value Date   TRIG 178* 04/13/2013   Lab Results  Component Value Date   CHOLHDL 4.8 04/13/2013   No results found for this basename: LDLDIRECT    Lab Results  Component Value Date   PROBNP 7574.0* 07/14/2013   PROBNP 398.0* 03/12/2013   Lab Results  Component Value Date   TSH 0.746 03/11/2013   No results found for this basename: HGBA1C    Radiology: Dg Chest 2 View  07/14/2013   CLINICAL DATA:  Shortness of breath.  Right-sided chest pain  EXAM: CHEST  2 VIEW  COMPARISON:  03/16/2013  FINDINGS: There is a left chest wall pacer device with lead in the right atrial appendage and right ventricle. There is mild cardiac enlargement. Bilateral pleural effusions and pulmonary vascular congestion is identified. No airspace consolidation.  IMPRESSION: 1. Mild CHF.   Electronically Signed   By: Kerby Moors M.D.   On: 07/14/2013 15:59   Ct Angio Chest Pe W/cm &/or Wo Cm  07/14/2013   CLINICAL DATA:  Shortness of breath and chest pain  EXAM: CT ANGIOGRAPHY CHEST WITH CONTRAST  TECHNIQUE: Multidetector CT imaging of the chest was performed using the standard protocol during bolus administration of  intravenous contrast. Multiplanar CT image reconstructions and MIPs were obtained to evaluate the vascular anatomy.  CONTRAST:  163mL OMNIPAQUE IOHEXOL 350 MG/ML SOLN  COMPARISON:  Chest CT March 11, 2013 and chest radiograph July 14, 2013  FINDINGS: There are small right lower lobes subsegmental level pulmonary emboli. No larger pulmonary emboli are identified. There is no demonstrable right heart strain.  There is no appreciable thoracic aortic aneurysm.  There are moderate pleural effusions bilaterally. There is consolidation in the right lower lobe with patchy atelectasis in the left lower lobe.  There is ill-defined opacity in the left perihilar region which appears to be localized alveolar edema. There is mild interstitial edema in the lower lobes bilaterally. Heart is enlarged. Pericardium is not thickened.  There is no appreciable thoracic adenopathy.  Visualized upper abdominal structures appear unremarkable. There are no blastic or lytic bone lesions. Pacemaker leads are attached to the right atrium and right ventricle. The thyroid appears unremarkable.  Review of the MIP images confirms the above findings.  IMPRESSION: Subsegmental level right lower lobe pulmonary emboli. No larger pulmonary emboli.  Evidence of a degree of congestive heart failure. Right base consolidation is present. Pacemaker leads are attached to the right atrium and right ventricle.  No apparent right heart strain.  Critical Value/emergent results were called by telephone at the time of interpretation on 07/14/2013 at 5:15 PM to Dr. Francine Graven , who verbally acknowledged these results.   Electronically Signed   By: Lowella Grip M.D.   On: 07/14/2013 17:19    EKG: Sinus tachy versus atrial flutter/ MAT with paced ventricular rhythm.    Venous doppler 07/08/13:Summary:  - Findings consistent with acute deep vein thrombosis involving the left femoral, popliteal, posterior tibial, and peroneal veins. The DVT noted in  the left common femoral and profunda veins on 03-11-13 appear resolved. - No evidence of Baker&'s cyst on the left.  Other specific details can be found in the table(s) above. Prepared and Electronically Authenticated by  Leia Alf, MD 2015-06-04T18:29:40   Echo: 03/12/13:Study Conclusions  - Left ventricle: The cavity size was mildly dilated. Wall thickness was increased in a pattern of mild LVH. Systolic function was normal. The estimated ejection fraction was in the range of 50% to 55%. Wall motion was normal; there were no regional wall motion abnormalities. Doppler parameters are consistent with high ventricular filling pressure. - Aortic valve: Mild regurgitation. - Right atrium: The atrium was mildly dilated.    ASSESSMENT AND PLAN:  1. Acute CHF- presumably diastolic with elevated BNP and small pleural effusions. Positive JVD. Good initial response to diuresis. Need to reassess Echo. Continue metoprolol. It is possible that arrhythmia may be playing a role with intermittent atrial flutter. Would add ACEi as BP allows.  2. Recurrent PE with "acute" DVT left leg by doppler 07/08/13. Patient reports compliance with Xarelto. Would consider higher dose Xarelto 15 mg bid for 3 weeks then long term at 20 mg daily.   3. Complete heart block s/p pacemaker  4. Intermittent atrial flutter/ MAT/ atrial tachycardia on monitor. Will interrogate pacemaker today. Increase metoprolol dose. This is probably triggered by recurrent PE +/- CHF.   5. HTN  6. Chronic ankle pain. Would avoid ASA and NSAIDs with Xarelto given increased risk of bleeding.   Thank you for the consult. We will follow with you.   Signed: Peter Martinique, [place]  07/15/2013, 9:44 AM

## 2013-07-15 NOTE — Plan of Care (Signed)
Problem: Phase I Progression Outcomes Goal: EF % per last Echo/documented,Core Reminder form on chart Outcome: Completed/Met Date Met:  07/15/13 EF 50-55% per ECHO performed on 03/22/13.

## 2013-07-15 NOTE — Discharge Summary (Signed)
[facility] Discharge Summary  Patient name: Philip Chavez Medical record number: [id number] Date of birth: [birth year] Age: 63 y.o. Gender: male Date of Admission: 07/14/2013  Date of Discharge: 07/19/2013 Admitting Physician: Zigmund Gottron, MD  Primary Care Provider: Thersa Salt, DO Consultants: None  Indication for Hospitalization: Acute CHF  Discharge Diagnoses/Problem List:  Patient Active Problem List   Diagnosis Date Noted  . Congestive dilated cardiomyopathy 07/18/2013  . Acute systolic CHF (congestive heart failure) 07/14/2013  . Insomnia 07/08/2013  . Allergic rhinitis 07/07/2013  . Dyspnea 07/07/2013  . Pacemaker 06/17/2013  . Preventative health care 04/13/2013  . Embolism, pulmonary with infarction 03/12/2013  . Third degree heart block 03/12/2013  . DVT (deep venous thrombosis) 03/11/2013   Disposition: Discharged home  Discharge Condition: Stable, improved  Discharge Exam:  Gen: Pleasant well-appearing 63 y.o.male in NAD  Pulm: Non-labored on ambient air; CTAB, no wheezes  CV: Regular rate and rhythm, no murmur appreciated, no JVD; distal pulses intact/symmetric; LE trace edema L > R    Brief Hospital Course:  Philip Chavez is a 63 y.o. male presenting with shortness of breath, leg swelling, weight gain, and orthopnea of 2 weeks duration. 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   He has no history of CHF, but pro-BNP was elevated at 7574 and chest x-ray showed pulmonary edema with effusions as well as borderline cardiomegaly. Echocardiogram from February showed EF 50-55% with mild LVH and elevated filling pressures. Cardiac function worsened dramatically since that time, however, as repeat echo showed EF 20-25% and grade 3 diastolic dysfunction. Myoview showed a large scar and hypokinesis of inferior and lateral walls including the apex with an EF of 17%. Cardiac  catheterization was deferred to outpatient setting after optimization of medical management. Weight dropped from 216 at admission to 199lbs on discharged with diuresis. He reported feeling "100% better." ACS was ruled out with negative cardiac enzymes and unchanged, paced ECG.   Aspirin, ACE inhibitor, lasix, and spironolactone were started and continued at discharge. Beta blocker was continued at an increased dose due to atrial tachycardia. Blood pressure and BMP should be monitored at follow up. Due to a 10-year ASCVD risk of 12.3%, a high-intensity statin was started as well.   Philip Chavez has a history of repeat DVT and small pulmonary emboli from February 2015 which were seen on CTA during this admission. He reports complete adherence to xarelto 20mg  daily, so this was increased to 15mg  BID on 6/12. He is to continue this for 3 weeks, then return to the once daily dosing.  He has chronic right shoulder pain from falling off a roof in 2007 for which he was prescribed mobic. He has been taking other NSAIDs. He was cautioned against these and tylenol was recommended. He has also had relief with steroid injection in the past.   Issues for Follow Up:  - Follow up BP and BMP after starting lisinopril, spironolactone, lasix - Remind that xarelto BID dosing switches back to 20mg  daily on 7/3 - Follow up myalgias after starting high-intensity statin - Consider non-NSAID treatments, including steroid injection, if shoulder pain recurs  Significant Procedures: None  Significant Labs and Imaging:   Recent Labs Lab 07/14/13 1427  WBC 6.8  HGB 14.7  HCT 42.8  PLT 191    Recent Labs Lab 07/15/13 0217 07/16/13 0525 07/17/13 0407 07/18/13 0439 07/19/13 0350  NA 141 143 143 142  140  K 3.8 4.0 3.7 4.1 3.8  CL 102 103 103 103 99  CO2 24 26 26 26 27   GLUCOSE 125* 100* 116* 114* 94  BUN 17 20 24* 24* 22  CREATININE 1.24 1.35 1.40* 1.33 1.23  CALCIUM 8.9 8.8 8.8 8.9 8.9    Recent Labs   03/12/13 1335 07/14/13 1629  PROBNP 398.0* 7574.0*   2D ECHOCARDIOGRAM 03/12/2013  - Left ventricle: The cavity size was mildly dilated. Wall thickness was increased in a pattern of mild LVH. Systolic function was normal. The estimated ejection fraction was in the range of 50% to 55%. Wall motion was normal; there were no regional wall motion abnormalities. Doppler parameters are consistent with high ventricular filling pressure. - Aortic valve: Mild regurgitation. - Right atrium: The atrium was mildly dilated.   2D ECHOCARDIOGRAM 07/15/2013  - Left ventricle: The cavity size was severely dilated. Systolic function was severely reduced. The estimated ejection fraction was in the range of 20% to 25%. Diffuse hypokinesis. There was a reduced contribution of atrial contraction to ventricular filling, due to increased ventricular diastolic pressure or atrial contractile dysfunction. Doppler parameters are consistent with a reversible restrictive pattern, indicative of decreased left ventricular diastolic compliance and/or increased left atrial pressure (grade 3 diastolic dysfunction). - Aortic valve: There was mild regurgitation. - Aorta: Mildly dilated aortia at Sinuses of valsalva measuring 4.1cm. - Mitral valve: There was mild regurgitation. - Left atrium: The atrium was mildly dilated. - Right ventricle: The cavity size was moderately dilated. Wall thickness was normal. Systolic function was severely reduced. - Right atrium: The atrium was moderately dilated. - Tricuspid valve: There was moderate regurgitation. - Pulmonary arteries: PA peak pressure: 56 mm Hg (S).  MYOCARDIAL IMAGING WITH SPECT (REST AND PHARMACOLOGIC-STRESS) 07/17/2013 SPECT imaging demonstrates Large fixed defect involving the inferior  wall, lateral wall and apex compatible with old infarct/ scar. No  significant reversibility.  Quantitative gated analysis shows severe generalized hypokinesia  with akinesia  in the area of scar.  The resting left ventricular ejection fraction is 17% with  end-diastolic volume of 811 ml and end-systolic volume of 914 ml.  IMPRESSION:  Large old infarct/scar involving the inferior wall, lateral wall and  apex. No significant reversibility.  Severely diminished left ventricular ejection fraction of 17%.  LOWER EXTREMITY DOPPLER 07/08/2013  Findings consistent with acute deep vein thrombosis involving the left femoral, popliteal, posterior tibial, and peroneal veins. The DVT noted in the left common femoral and profunda veins on 03-11-13 appear resolved.   CXR 07/14/2013  There is a left chest wall pacer device with lead in the right  atrial appendage and right ventricle. There is mild cardiac  enlargement. Bilateral pleural effusions and pulmonary vascular  congestion is identified. No airspace consolidation.   ECG: paced rhythm   CT CHEST ANGIOGRAM  Subsegmental level right lower lobe pulmonary emboli. No larger  pulmonary emboli.  Evidence of a degree of congestive heart failure. Right base  consolidation is present. Pacemaker leads are attached to the right  atrium and right ventricle.  No apparent right heart strain.  Results/Tests Pending at Time of Discharge: None  Discharge Medications:    Medication List    STOP taking these medications       meloxicam 15 MG tablet  Commonly known as:  MOBIC      TAKE these medications       acetaminophen 500 MG tablet  Commonly known as:  TYLENOL  Take 1,000 mg by mouth  every 6 (six) hours as needed for mild pain.     aspirin 81 MG EC tablet  Take 1 tablet (81 mg total) by mouth daily.     atorvastatin 80 MG tablet  Commonly known as:  LIPITOR  Take 1 tablet (80 mg total) by mouth daily at 6 PM.     fluticasone 50 MCG/ACT nasal spray  Commonly known as:  FLONASE  Place 1 spray into both nostrils daily.     furosemide 40 MG tablet  Commonly known as:  LASIX  Take 1 tablet (40 mg total) by  mouth 2 (two) times daily.     lisinopril 5 MG tablet  Commonly known as:  PRINIVIL,ZESTRIL  Take 1 tablet (5 mg total) by mouth daily.     metoprolol tartrate 25 MG tablet  Commonly known as:  LOPRESSOR  Take 3 tablets (75 mg total) by mouth 2 (two) times daily.     Rivaroxaban 15 MG Tabs tablet  Commonly known as:  XARELTO  Take 1 tablet (15 mg total) by mouth 2 (two) times daily.     spironolactone 25 MG tablet  Commonly known as:  ALDACTONE  Take 0.5 tablets (12.5 mg total) by mouth daily.     zolpidem 10 MG tablet  Commonly known as:  AMBIEN  Take 1 tablet (10 mg total) by mouth at bedtime as needed for sleep.        Discharge Instructions: Please refer to Patient Instructions section of EMR for full details.  Patient was counseled important signs and symptoms that should prompt return to medical care, changes in medications, dietary instructions, activity restrictions, and follow up appointments.   Follow-Up Appointments: Follow-up Information   Follow up with Richardson Dopp, PA-C On 08/05/2013. (at 12:10 pm)    Specialty:  Physician Assistant   Contact information:   [address] [address] [phone]       Follow up with [facility] On 07/22/2013. (Lab work between 8:30 am and 12:30 pm. Do not have to be fasting and can take usual medications.)    Contact information:   [place] [address]       Follow up with RIGBY, [place], DO. Schedule an appointment as soon as possible for a visit on 07/26/2013. (4:15pm)    Specialty:  Family Medicine   Contact information:   [place] [address] [phone]       Vance Gather, MD 07/19/2013, 9:09 PM PGY-1, [place]

## 2013-07-15 NOTE — Progress Notes (Signed)
Pt a/o, no c/o pain, pt oob ad lib, pt had echo done today results pending, vss, pt stable

## 2013-07-15 NOTE — H&P (Signed)
Seen and examined.  Discussed with Dr. Bridgett Larsson.  Agree with admit and her documentation and management.  Briefly, Philip Chavez presents with dyspnea and worsening leg swelling.  He has a complex recent past medical history with DVT/PE and pacer for sick sinus node syndrome.  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  His labs and CT are most suggestive of CHF and he has responded beautifully to lasix.  ("I feel a whole lot better than yesterday.)  I am surprised by the CHF given his last echo result 2/15.  Will re-involve cards to see if they would like to further work up this presumed newly diagnosed CHF.

## 2013-07-16 DIAGNOSIS — I5021 Acute systolic (congestive) heart failure: Principal | ICD-10-CM

## 2013-07-16 DIAGNOSIS — I498 Other specified cardiac arrhythmias: Secondary | ICD-10-CM

## 2013-07-16 LAB — BASIC METABOLIC PANEL
BUN: 20 mg/dL (ref 6–23)
CO2: 26 mEq/L (ref 19–32)
CREATININE: 1.35 mg/dL (ref 0.50–1.35)
Calcium: 8.8 mg/dL (ref 8.4–10.5)
Chloride: 103 mEq/L (ref 96–112)
GFR calc non Af Amer: 55 mL/min — ABNORMAL LOW (ref >90)
GFR, EST AFRICAN AMERICAN: 63 mL/min — AB (ref >90)
Glucose, Bld: 100 mg/dL — ABNORMAL HIGH (ref 70–99)
POTASSIUM: 4 meq/L (ref 3.7–5.3)
Sodium: 143 mEq/L (ref 137–147)

## 2013-07-16 MED ORDER — LISINOPRIL 5 MG PO TABS
5.0000 mg | ORAL_TABLET | Freq: Every day | ORAL | Status: DC
  Administered 2013-07-16 – 2013-07-19 (×4): 5 mg via ORAL
  Filled 2013-07-16 (×4): qty 1

## 2013-07-16 NOTE — Progress Notes (Signed)
Heart Failure Navigator Consult Note  Presentation: Philip Chavez  is a 63 y.o. 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 Also ECHO with newly reduced EF (20-25%).   Past Medical History  Diagnosis Date  . DVT (deep venous thrombosis) 2007; 03/2013    RLE: S/P foot OR; LLE  . Embolism, pulmonary with infarction 2007; 03/2013    S/P foot OR; bilateral  . Complete AV block     s/p MDT pacemaker  . Third degree heart block   . Hypercoagulable state   . Musculoskeletal neck pain   . Hypertension     History   Social History  . Marital Status: Married    Spouse Name: N/A    Number of Children: N/A  . Years of Education: N/A   Social History Main Topics  . Smoking status: Former Smoker -- 1.50 packs/day for 10 years    Types: Cigarettes  . Smokeless tobacco: Never Used     Comment: 03/16/2013 "quit smoking 20-30 yr ago"  . Alcohol Use: No  . Drug Use: No  . Sexual Activity: Yes   Other Topics Concern  . None   Social History Narrative  . None    ECHO:Study Conclusions--07/15/13  - Left ventricle: The cavity size was severely dilated. Systolic function was severely reduced. The estimated ejection fraction was in the range of 20% to 25%. Diffuse hypokinesis. There was a reduced contribution of atrial contraction to ventricular filling, due to increased ventricular diastolic pressure or atrial contractile dysfunction. Doppler parameters are consistent with a reversible restrictive pattern, indicative of decreased left ventricular diastolic compliance and/or increased left atrial pressure (grade 3 diastolic dysfunction). -  Aortic valve: There was mild regurgitation. - Aorta: Mildly dilated aortia at Sinuses of valsalva measuring 4.1cm. - Mitral valve: There was mild regurgitation. - Left atrium: The atrium was mildly dilated. - Right ventricle: The cavity size was moderately dilated. Wall thickness was normal. Systolic function was severely reduced. - Right atrium: The atrium was moderately dilated. - Tricuspid valve: There was moderate regurgitation. - Pulmonary arteries: PA peak pressure: 56 mm Hg (S). - Inferior vena cava: The vessel was dilated. The respirophasic diameter changes were blunted (< 50%), consistent with elevated central venous pressure. - Impressions: The right ventricular systolic pressure was increased consistent with moderate pulmonary hypertension.  Impressions:  - The right ventricular systolic pressure was increased consistent with moderate pulmonary hypertension.  Transthoracic echocardiography. M-mode, complete 2D, spectral Doppler, and color Doppler. Birthdate: Patient birthdate: 1950-12-01. Age: Patient is 63 yr old. Sex: Gender: male. Height: Height: 185.4 cm. Height: 73 in. Weight: Weight: 93.9 kg. Weight: 206.6 lb. Body mass index: BMI: 27.3 kg/m^2. Body surface area: BSA: 2.21 m^2. Blood pressure: 118/86 Patient status: Inpatient. Study date: Study date: 07/15/2013. Study time: 10:43 AM. Location: Echo laboratory.   BNP    Component Value Date/Time   PROBNP 7574.0* 07/14/2013 1629    Education Assessment and Provision:  Detailed education and instructions provided on heart failure disease management including the following:  Signs and symptoms of Heart Failure When to call the physician Importance of daily weights Low sodium diet Fluid restriction Medication management Anticipated future follow-up  appointments  Patient education given on each of the above topics.  Patient acknowledges understanding and acceptance of all instructions.  Mr. Camargo is newly  diagnosed HF.   He was able to teach back most of the topics described above.  He had previously been given the HF booklet and claimed to have already reviewed the information. I gave him some additional information about low sodium diet.  He says that he will begin to weigh daily and I explained the importance of that as a tool for his HF.  He seemed motivated to make lifestyle changes for his health.  Education Materials:  "Living Better With Heart Failure" Booklet, Daily Weight Tracker Tool    High Risk Criteria for Readmission and/or Poor Patient Outcomes   EF <30%- Yes 20-25% and Grade 3 dias. Dys  2 or more admissions in 6 months-Yes  Difficult social situation- No  Demonstrates medication noncompliance- No  Barriers of Care:  Knowledge of HF--newly diagnosed / compliance  Discharge Planning:  Plans to discharge to home with wife.  Will follow- up with [facility].

## 2013-07-16 NOTE — Progress Notes (Signed)
Seen and examined.  Discussed with Dr. Bonner Puna.  Agree with his documentation and management.  I am frankly surprised by the dramatic worsening of his EF since echo of Feb.  Etiology? Ischemic? Tachycardia induced? Stress induced? I feel needs further workup.  We will not DC today as initially planned.  I feel he needs further WU.  Defer to cards as to the exact nature of that WU.

## 2013-07-16 NOTE — Progress Notes (Signed)
Family Medicine Teaching Service Daily Progress Note Intern Pager: [phone]  Patient name: Philip Chavez Medical record number: [id number] Date of birth: 19-Aug-1950 Age: 63 y.o. Gender: male  Primary Care Provider: Thersa Salt, DO Consultants: None Code Status: Full  Pt Overview and Major Events to Date:  6/10: Admitted for acute CHF; wt: 216 lbs. 6/11: Wt: 207 lbs. 6/12: Echo markedly worsened from February  Assessment and Plan: Philip Chavez is a 63 y.o. male presenting with shortness of breath. 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   Acute CHF: Pro-BNP: 7574, some pulmonary edema with effusions as well as borderline cardiomegaly on CXR. EF 50-55% with mild LVH and elevated filling pressures on echo Feb 2015.  - Diuresis with lasix 20mg  IV daily: K, Cr stable - Daily weights: 216 > 207 > 205 lbs - On beta blocker, statin, ?needs ACE for presumed diastolic HF - Echo shows EF 73-41%, grade 3 diastolic dysfunction; consistent with pulm HTN  Chronic pulmonary emboli and history of DVT x2 on lifelong anticoagulation with xarelto. Multiple small clots without evidence of right heart strain on CTA. Recent LE doppler with "acute" DVTs but doesn't mention any new clots from previous study.  -Xarelto to 15mg  BID x3 weeks started 6/12  Atrial tachycardia: Interrogation of pacemaker, increase beta blocker dose per cardiology recommendations.  Shoulder/ankle pain: Avoid ASA, NSAIDs. Tx: tylenol 1,000mg  q6h prn HTN: Normotensive, continue home metoprolol 75mg  BID, likely add low-dose ACE Allergic rhinitis: Continue flonase  Insomnia: Ambien  FEN/GI: Saline lock IV, heart healthy (low sodium) diet  Prophylaxis: On xarelto 15mg  BID  Disposition: Continue work up, ?cath  Subjective:  Feels great. No concerns. Slept well with ambien.   Objective: Temp:  [97.6 F (36.4 C)-98.3 F (36.8 C)] 98.3 F (36.8 C) (06/12 0531) Pulse  Rate:  [66-104] 93 (06/12 0531) Resp:  [18-20] 18 (06/12 0531) BP: (112-132)/(72-85) 132/85 mmHg (06/12 0531) SpO2:  [98 %-100 %] 98 % (06/12 0531) Weight:  [205 lb 11 oz (93.3 kg)] 205 lb 11 oz (93.3 kg) (06/12 0531) Physical Exam: General: Pleasant 63 y.o. male in NAD  Cardiovascular: Regular rate, no murmur, S3 or S4 noted. +JVD. 2+ radial and DP pulses.  Respiratory: Non-labored on room air, increased rate, bibasilar crackles. Intermittent cough.  Abdomen: +BS, soft, NT, NT  Extremities: WWP, 1+ bilateral pitting edema of legs L>R. Minimal calf pain with compression.   Laboratory:  Recent Labs Lab 07/14/13 1427  WBC 6.8  HGB 14.7  HCT 42.8  PLT 191    Recent Labs Lab 07/14/13 1427 07/15/13 0217 07/16/13 0525  NA 137 141 143  K 4.2 3.8 4.0  CL 100 102 103  CO2 20 24 26   BUN 18 17 20   CREATININE 1.17 1.24 1.35  CALCIUM 9.2 8.9 8.8  GLUCOSE 116* 125* 100*   PROBNP  7574.0*    2D ECHOCARDIOGRAM 03/12/2013  - Left ventricle: The cavity size was mildly dilated. Wall thickness was increased in a pattern of mild LVH. Systolic function was normal. The estimated ejection fraction was in the range of 50% to 55%. Wall motion was normal; there were no regional wall motion abnormalities. Doppler parameters are consistent with high ventricular filling pressure. - Aortic valve: Mild regurgitation. - Right atrium: The atrium was mildly dilated.   Imaging/Diagnostic Tests: LOWER EXTREMITY DOPPLER 07/08/2013  Findings consistent with acute deep vein thrombosis involving the left femoral, popliteal, posterior tibial, and peroneal veins.  The DVT noted in the left common femoral and profunda veins on 03-11-13 appear resolved.   CXR 07/14/2013  There is a left chest wall pacer device with lead in the right  atrial appendage and right ventricle. There is mild cardiac  enlargement. Bilateral pleural effusions and pulmonary vascular  congestion is identified. No airspace  consolidation.  EKG: paced rhythm  CT CHEST ANGIOGRAM Subsegmental level right lower lobe pulmonary emboli. No larger  pulmonary emboli.  Evidence of a degree of congestive heart failure. Right base  consolidation is present. Pacemaker leads are attached to the right  atrium and right ventricle.  No apparent right heart strain.  Vance Gather, MD 07/16/2013, 8:18 AM PGY-1, [place] Intern pager: [phone], text pages welcome

## 2013-07-16 NOTE — Progress Notes (Addendum)
Patient Name: Philip Chavez Date of Encounter: 07/16/2013     Principal Problem:   Acute CHF Active Problems:   DVT (deep venous thrombosis)   Embolism, pulmonary with infarction   Third degree heart block   Pacemaker   Dyspnea    SUBJECTIVE  Feeling much better from a breathing standpoint. No CP or SOB. He has had palpitations in the past but did not seem to feel is runs of SVT this AM. Able to lay pretty flat last night. No PND. Legs still with some swelling.   CURRENT MEDS . atorvastatin  80 mg Oral q1800  . furosemide  20 mg Intravenous Daily  . metoprolol  75 mg Oral BID  . rivaroxaban  15 mg Oral BID    OBJECTIVE  Filed Vitals:   07/15/13 0607 07/15/13 1546 07/15/13 2026 07/16/13 0531  BP: 118/86 112/72 119/85 132/85  Pulse: 90 104 66 93  Temp: 98.3 F (36.8 C) 97.6 F (36.4 C) 97.7 F (36.5 C) 98.3 F (36.8 C)  TempSrc: Oral Oral Oral Oral  Resp: 20 20 20 18   Height:      Weight: 207 lb 10.8 oz (94.2 kg)   205 lb 11 oz (93.3 kg)  SpO2: 99% 100% 100% 98%    Intake/Output Summary (Last 24 hours) at 07/16/13 1018 Last data filed at 07/16/13 0851  Gross per 24 hour  Intake    840 ml  Output    800 ml  Net     40 ml   Filed Weights   07/14/13 2014 07/15/13 0607 07/16/13 0531  Weight: 213 lb 1.6 oz (96.662 kg) 207 lb 10.8 oz (94.2 kg) 205 lb 11 oz (93.3 kg)    PHYSICAL EXAM  General: Pleasant, NAD. Neuro: Alert and oriented X 3. Moves all extremities spontaneously. Psych: Normal affect. HEENT:  Normal  Neck: Supple without bruits. Mild JVD. Lungs:  Resp regular and unlabored. Mild crackles at bases.  Heart: RRR no s3, s4, or murmurs. Abdomen: Soft, non-tender, non-distended, BS + x 4.  Extremities: No clubbing, cyanosis or edema. DP/PT/Radials 2+ and equal bilaterally. 1+ pitting edema L>R.   Accessory Clinical Findings  CBC  Recent Labs  07/14/13 1427  WBC 6.8  NEUTROABS 4.3  HGB 14.7  HCT 42.8  MCV 90.7  PLT 644   Basic Metabolic  Panel  Recent Labs  07/15/13 0217 07/16/13 0525  NA 141 143  K 3.8 4.0  CL 102 103  CO2 24 26  GLUCOSE 125* 100*  BUN 17 20  CREATININE 1.24 1.35  CALCIUM 8.9 8.8    Cardiac Enzymes  Recent Labs  07/14/13 2133 07/15/13 0217 07/15/13 0836  TROPONINI <0.30 <0.30 <0.30   Hemoglobin A1C  Recent Labs  07/15/13 0217  HGBA1C 6.0*    TELE  Sinus tachy versus atrial flutter/ MAT with paced ventricular rhythm.    Radiology/Studies  Dg Chest 2 View  07/14/2013   CLINICAL DATA:  Shortness of breath.  Right-sided chest pain  EXAM: CHEST  2 VIEW  COMPARISON:  03/16/2013  FINDINGS: There is a left chest wall pacer device with lead in the right atrial appendage and right ventricle. There is mild cardiac enlargement. Bilateral pleural effusions and pulmonary vascular congestion is identified. No airspace consolidation.  IMPRESSION: 1. Mild CHF.   Electronically Signed   By: Kerby Moors M.D.   On: 07/14/2013 15:59   Ct Angio Chest Pe W/cm &/or Wo Cm  07/14/2013   CLINICAL DATA:  Shortness  of breath and chest pain  EXAM: CT ANGIOGRAPHY CHEST WITH CONTRAST  TECHNIQUE: Multidetector CT imaging of the chest was performed using the standard protocol during bolus administration of intravenous contrast. Multiplanar CT image reconstructions and MIPs were obtained to evaluate the vascular anatomy.  CONTRAST:  129mL OMNIPAQUE IOHEXOL 350 MG/ML SOLN  COMPARISON:  Chest CT March 11, 2013 and chest radiograph July 14, 2013  FINDINGS: There are small right lower lobes subsegmental level pulmonary emboli. No larger pulmonary emboli are identified. There is no demonstrable right heart strain.  There is no appreciable thoracic aortic aneurysm.  There are moderate pleural effusions bilaterally. There is consolidation in the right lower lobe with patchy atelectasis in the left lower lobe.  There is ill-defined opacity in the left perihilar region which appears to be localized alveolar edema. There is  mild interstitial edema in the lower lobes bilaterally. Heart is enlarged. Pericardium is not thickened.  There is no appreciable thoracic adenopathy.  Visualized upper abdominal structures appear unremarkable. There are no blastic or lytic bone lesions. Pacemaker leads are attached to the right atrium and right ventricle. The thyroid appears unremarkable.  Review of the MIP images confirms the above findings.  IMPRESSION: Subsegmental level right lower lobe pulmonary emboli. No larger pulmonary emboli.  Evidence of a degree of congestive heart failure. Right base consolidation is present. Pacemaker leads are attached to the right atrium and right ventricle.  No apparent right heart strain.  Critical Value/emergent results were called by telephone at the time of interpretation on 07/14/2013 at 5:15 PM to Dr. Francine Graven , who verbally acknowledged these results.    Venous doppler 07/08/13: Summary: - Findings consistent with acute deep vein thrombosis involving the left femoral, popliteal, posterior tibial, and peroneal veins. The DVT noted in the left common femoral and profunda veins on 03-11-13 appear resolved. - No evidence of Baker&'s cyst on the left.  Echo: 03/12/13:Study Conclusions - Left ventricle: The cavity size was mildly dilated. Wall thickness was increased in a pattern of mild LVH. Systolic function was normal. The estimated ejection fraction was in the range of 50% to 55%. Wall motion was normal; there were no regional wall motion abnormalities. Doppler parameters are consistent with high ventricular filling pressure. - Aortic valve: Mild regurgitation. - Right atrium: The atrium was mildly dilated.   2D ECHO: 07/15/2013 LV EF: 20% - 25% Study Conclusions - Left ventricle: The cavity size was severely dilated. Systolic function was severely reduced. The estimated ejection fraction was in the range of 20% to 25%. Diffuse hypokinesis. There was a reduced contribution of  atrial contraction to ventricular filling, due to increased ventricular diastolic pressure or atrial contractile dysfunction. Doppler parameters are consistent with a reversible restrictive pattern, indicative of decreased left ventricular diastolic compliance and/or increased left atrial pressure (grade 3 diastolic dysfunction). - Aortic valve: There was mild regurgitation. - Aorta: Mildly dilated aortia at Sinuses of valsalva measuring 4.1cm. - Mitral valve: There was mild regurgitation. - Left atrium: The atrium was mildly dilated. - Right ventricle: The cavity size was moderately dilated. Wall thickness was normal. Systolic function was severely reduced. - Right atrium: The atrium was moderately dilated. - Tricuspid valve: There was moderate regurgitation. - Pulmonary arteries: PA peak pressure: 56 mm Hg (S). - Inferior vena cava: The vessel was dilated. The respirophasic diameter changes were blunted (< 50%), consistent with elevated central venous pressure. - Impressions: The right ventricular systolic pressure was increased consistent with moderate pulmonary hypertension. Impressions: -  The right ventricular systolic pressure was increased consistent with moderate pulmonary hypertension.    ASSESSMENT AND PLAN Philip Chavez is a 63 y.o. 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 Also ECHO with newly reduced EF (20-25%).  Acute CHF- systolic with elevated BNP (7.5K) and small pleural effusions. Still looks mildly volume overloaded -- ECHO in 03/2013 with EF 50-55%, now EF 20-25% with severely dilated LV. With mod pulm htn and  increased R vent systolic pressure.  -- May need a cath at some point with newly reduced EF. However, this is c/b acute PE and chronic anticoagulation.  -- Continue metoprolol and IV Lasix 20mg  po qd.  -- Neg 1.8L, weight down 9 lbs ( likely inaccurate) -- Consider adding ACEi in the setting of systolic dysfunction. BP okay and creat normal.  Recurrent PE with "acute" DVT left leg by doppler 07/08/13. Patient reports compliance with Xarelto.  -- Would consider higher dose Xarelto 15 mg bid for 3 weeks then long term at 20 mg daily ( per Dr. Martinique) -- Full hypercoagulability work up done in 2008 which was normal. Unsure why reccurent DVT on anticoagulation. Consider hematology consult  Complete heart block s/p pacemaker.   Intermittent atrial flutter/ MAT/ atrial tachycardia on monitor. - This is probably triggered by recurrent PE +/- CHF.  -- Metoprolol dose increased to 75mg  BID.  -- Pacemaker interrogated yesterday with AP 2.1 % and VP 95.9%.   203 atrial high rate episode (2.3% mode switched). Longest duration 50 minutes.    10 Ventricular hight rate episodes. Most recent on 07/03/13. Longest duration was 8 seconds.    Ave V rate was 150 bpm.    E grams reveal V-A dissociation.   HTN-  Well controlled.  Chronic ankle pain. Would avoid ASA and NSAIDs with Xarelto given increased risk of bleeding.    Tyrell Antonio PA-C  Pager [phone]  The patient has been interviewed, examined, and all data has been reviewed. He has new acute systolic heart failure with unknown etiology. Potential causes would include acute toxicity (unlikely), tachycardia mediated LV systolic dysfunction (reviewed May pacer interrogation in chart and there is no evidence of excessive tachycardia), stress cardiomyopathy (contraction pattern is not typical), and coronary artery disease. We should aggressively diurese the patient and manage the other medical issues (pulmonary emboli) accordingly.   Neurohormonal blockade including ACE/ARB therapy and beta blocker therapy are crucial to improve LV dysfunction. Recommend a myocardial perfusion study to exclude high risk coronary substrate. I would perform coronary angiography only if symptoms of angina or clear evidence of ischemia by nuclear imaging.  HWBSmith, III

## 2013-07-16 NOTE — Discharge Instructions (Addendum)
Mr. Goeller, you were admitted for acute heart failure which caused you to gain water weight and have water on your lungs making it more difficult to breathe. You were given lasix which took this water off and you've returned to your normal dry weight, which is 200lbs.   A few medications have been changed: - Take lasix 40mg  twice per day  - Take spironolactone once per day - Take lisinopril once per day - Take Lipitor once per day - Do NOT use NSAIDs (these include ibuprofen, mobic, meloxicam, aleve, naproxen, goody's) - You can take tylenol for your shoulder pain - Take xarelto 15mg  twice per day. (You were taking 20mg  once per day) for 3 weeks. Then go back to taking 20mg  once per day.   If you experience weight gain more than 10 pounds in a week or 3 pounds in a day, call the [facility] office at [phone] for advice. If you have difficulty breathing or chest pain you should seek medical attention right away.   We hope you feel better soon,  - Family Medicine Teaching Service  ----------------------------------------------------------------------------------------------------------------  Information on my medicine - XARELTO (rivaroxaban)  This medication education was reviewed with me or my healthcare representative as part of my discharge preparation.  The pharmacist that spoke with me during my hospital stay was:  Bajbus, Lauren, RPH  WHY WAS XARELTO PRESCRIBED FOR YOU? Xarelto was prescribed to treat blood clots that may have been found in the veins of your legs (deep vein thrombosis) or in your lungs (pulmonary embolism) and to reduce the risk of them occurring again.  What do you need to know about Xarelto? The starting dose is one 15 mg tablet taken TWICE daily with food for the FIRST 21 DAYS then on 08/06/2013  the dose is changed to one 20 mg tablet taken ONCE A DAY with your evening meal.  DO NOT stop taking Xarelto without talking to the health care provider who  prescribed the medication.  Refill your prescription for 20 mg tablets before you run out.  After discharge, you should have regular check-up appointments with your healthcare provider that is prescribing your Xarelto.  In the future your dose may need to be changed if your kidney function changes by a significant amount.  What do you do if you miss a dose? If you are taking Xarelto TWICE DAILY and you miss a dose, take it as soon as you remember. You may take two 15 mg tablets (total 30 mg) at the same time then resume your regularly scheduled 15 mg twice daily the next day.  If you are taking Xarelto ONCE DAILY and you miss a dose, take it as soon as you remember on the same day then continue your regularly scheduled once daily regimen the next day. Do not take two doses of Xarelto at the same time.   Important Safety Information Xarelto is a blood thinner medicine that can cause bleeding. You should call your healthcare provider right away if you experience any of the following:   Bleeding from an injury or your nose that does not stop.   Unusual colored urine (red or dark brown) or unusual colored stools (red or black).   Unusual bruising for unknown reasons.   A serious fall or if you hit your head (even if there is no bleeding).  Some medicines may interact with Xarelto and might increase your risk of bleeding while on Xarelto. To help avoid this, consult your healthcare provider  or pharmacist prior to using any new prescription or non-prescription medications, including herbals, vitamins, non-steroidal anti-inflammatory drugs (NSAIDs) and supplements.  This website has more information on Xarelto: [email or web address]

## 2013-07-17 ENCOUNTER — Inpatient Hospital Stay (HOSPITAL_COMMUNITY): Payer: BC Managed Care – PPO

## 2013-07-17 DIAGNOSIS — I428 Other cardiomyopathies: Secondary | ICD-10-CM

## 2013-07-17 DIAGNOSIS — R0989 Other specified symptoms and signs involving the circulatory and respiratory systems: Secondary | ICD-10-CM

## 2013-07-17 DIAGNOSIS — R0609 Other forms of dyspnea: Secondary | ICD-10-CM

## 2013-07-17 LAB — BASIC METABOLIC PANEL
BUN: 24 mg/dL — ABNORMAL HIGH (ref 6–23)
CALCIUM: 8.8 mg/dL (ref 8.4–10.5)
CHLORIDE: 103 meq/L (ref 96–112)
CO2: 26 mEq/L (ref 19–32)
Creatinine, Ser: 1.4 mg/dL — ABNORMAL HIGH (ref 0.50–1.35)
GFR calc non Af Amer: 52 mL/min — ABNORMAL LOW (ref >90)
GFR, EST AFRICAN AMERICAN: 61 mL/min — AB (ref >90)
Glucose, Bld: 116 mg/dL — ABNORMAL HIGH (ref 70–99)
Potassium: 3.7 mEq/L (ref 3.7–5.3)
SODIUM: 143 meq/L (ref 137–147)

## 2013-07-17 MED ORDER — REGADENOSON 0.4 MG/5ML IV SOLN
0.4000 mg | Freq: Once | INTRAVENOUS | Status: AC
  Administered 2013-07-17: 0.4 mg via INTRAVENOUS

## 2013-07-17 MED ORDER — TECHNETIUM TC 99M SESTAMIBI GENERIC - CARDIOLITE
30.0000 | Freq: Once | INTRAVENOUS | Status: AC | PRN
  Administered 2013-07-17: 30 via INTRAVENOUS

## 2013-07-17 MED ORDER — REGADENOSON 0.4 MG/5ML IV SOLN
INTRAVENOUS | Status: AC
  Administered 2013-07-17: 12:00:00
  Filled 2013-07-17: qty 5

## 2013-07-17 MED ORDER — TECHNETIUM TC 99M SESTAMIBI GENERIC - CARDIOLITE
10.0000 | Freq: Once | INTRAVENOUS | Status: AC | PRN
  Administered 2013-07-17: 10 via INTRAVENOUS

## 2013-07-17 NOTE — Progress Notes (Signed)
Seen and examined.  Discussed with Dr. Lacinda Axon.  Agree with his documentation and management.  Appreciate cards help for WU of this unexpected sudden drop in EF.

## 2013-07-17 NOTE — Progress Notes (Signed)
Patient ID: Philip Chavez, male   DOB: 05-Dec-1950, 63 y.o.   MRN: [id number] The patient is for nuclear stress study today. We will continue to follow.  Daryel November, MD

## 2013-07-17 NOTE — Progress Notes (Signed)
Family Medicine Teaching Service Daily Progress Note Intern Pager: [phone]  Patient name: Philip Chavez Medical record number: [id number] Date of birth: 12/25/1950 Age: 63 y.o. Gender: male  Primary Care Provider: Thersa Salt, DO Consultants: None Code Status: Full  Pt Overview and Major Events to Date:  6/10: Admitted for acute CHF; wt: 216 lbs. 6/11: Wt: 207 lbs. 6/12: Echo markedly worsened from February  Assessment and Plan: Philip Chavez is a 63 y.o. male presenting with shortness of breath. 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   Acute CHF, combined systolic and diastolic: Pro-BNP: 6712, some pulmonary edema with effusions as well as borderline cardiomegaly on CXR. Echo now showing marked decrease in EF (20-25%), diffuse hypokinesis, Grade 3 diastolic dysfunction. - Cardiology following; we appreciate their help in managing this patient. Patient to go for perfusion study today.  - Diuresis with lasix 20mg  IV daily - Continue Metoprolol 75 mg BID; Lisinopril added 6/12.  - Continue to follow daily weights and I/O's.   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 Multiple small clots without evidence of right heart strain on CTA. Recent LE doppler with "acute" DVTs but doesn't mention any new clots from previous study.  -Xarelto to 15mg  BID x3 weeks started 6/12  Rise in creatinine - 1.35 >> 1.40 - Likely secondary to ACEI - Will continue to trend.  Atrial tachycardia: Interrogation of pacemaker, increased beta blocker dose per cardiology recommendations.   Shoulder/ankle pain: Avoid ASA, NSAIDs. Tx: tylenol 1,000mg  q6h prn  HTN: Normotensive, continue home metoprolol 75mg  BID, Lisinopril added 6/12.   Allergic rhinitis: Continue flonase   Insomnia: Ambien  FEN/GI: Saline lock IV, heart healthy (low sodium) diet  Prophylaxis: On xarelto 15mg  BID  Disposition:  Pending continued work up.  Patient to go for myocardial perfusion study today.   Subjective:  Feeling well this am.  Sitting up in chair. No SOB or chest pain.  Objective: Temp:  [98 F (36.7 C)-98.4 F (36.9 C)] 98.2 F (36.8 C) (06/13 0559) Pulse Rate:  [67-118] 88 (06/13 0559) Resp:  [18-20] 18 (06/13 0559) BP: (104-117)/(54-83) 111/54 mmHg (06/13 0559) SpO2:  [96 %-100 %] 98 % (06/13 0559) Weight:  [204 lb 11.2 oz (92.851 kg)] 204 lb 11.2 oz (92.851 kg) (06/13 0559)  Physical Exam: General: pleasant male, sitting up in chair. NAD.  Cardiovascular: RRR. No m/r/g. Respiratory: Fine crackles noted bibasilar. Extremities: 1+ edema RLE.   Laboratory:  Recent Labs Lab 07/14/13 1427  WBC 6.8  HGB 14.7  HCT 42.8  PLT 191    Recent Labs Lab 07/15/13 0217 07/16/13 0525 07/17/13 0407  NA 141 143 143  K 3.8 4.0 3.7  CL 102 103 103  CO2 24 26 26   BUN 17 20 24*  CREATININE 1.24 1.35 1.40*  CALCIUM 8.9 8.8 8.8  GLUCOSE 125* 100* 116*   PROBNP  7574.0*    2D ECHOCARDIOGRAM 03/12/2013  - Left ventricle: The cavity size was mildly dilated. Wall thickness was increased in a pattern of mild LVH. Systolic function was normal. The estimated ejection fraction was in the range of 50% to 55%. Wall motion was normal; there were no regional wall motion abnormalities. Doppler parameters are consistent with high ventricular filling pressure. - Aortic valve: Mild regurgitation. - Right atrium: The atrium was mildly dilated.   Imaging/Diagnostic Tests: LOWER EXTREMITY DOPPLER 07/08/2013  Findings consistent with acute deep vein thrombosis  involving the left femoral, popliteal, posterior tibial, and peroneal veins. The DVT noted in the left common femoral and profunda veins on 03-11-13 appear resolved.   CXR 07/14/2013  There is a left chest wall pacer device with lead in the right  atrial appendage and right ventricle. There is mild cardiac  enlargement. Bilateral pleural  effusions and pulmonary vascular  congestion is identified. No airspace consolidation.   EKG: paced rhythm  CT CHEST ANGIOGRAM Subsegmental level right lower lobe pulmonary emboli. No larger  pulmonary emboli.  Evidence of a degree of congestive heart failure. Right base  consolidation is present. Pacemaker leads are attached to the right  atrium and right ventricle.  No apparent right heart strain.  ECHO Study Conclusions - Left ventricle: The cavity size was severely dilated. Systolic function was severely reduced. The estimated ejection fraction was in the range of 20% to 25%. Diffuse hypokinesis. There was a reduced contribution of atrial contraction to ventricular filling, due to increased ventricular diastolic pressure or atrial contractile dysfunction. Doppler parameters are consistent with a reversible restrictive pattern, indicative of decreased left ventricular diastolic compliance and/or increased left atrial pressure (grade 3 diastolic dysfunction). - Aortic valve: There was mild regurgitation. - Aorta: Mildly dilated aortia at Sinuses of valsalva measuring 4.1cm. - Mitral valve: There was mild regurgitation. - Left atrium: The atrium was mildly dilated. - Right ventricle: The cavity size was moderately dilated. Wall thickness was normal. Systolic function was severely reduced. - Right atrium: The atrium was moderately dilated. - Tricuspid valve: There was moderate regurgitation. - Pulmonary arteries: PA peak pressure: 56 mm Hg (S). - Inferior vena cava: The vessel was dilated. The respirophasic diameter changes were blunted (< 50%), consistent with elevated central venous pressure. - Impressions: The right ventricular systolic pressure was increased consistent with moderate pulmonary hypertension.  Impressions: - The right ventricular systolic pressure was increased consistent with moderate pulmonary hypertension.  Coral Spikes, DO 07/17/2013, 6:48 AM PGY-2,  [place] Intern pager: [phone], text pages welcome

## 2013-07-17 NOTE — Progress Notes (Signed)
  Progress Note   Subjective:  Denies CP.  Breathing is improved.    Objective:  Filed Vitals:   07/16/13 1043 07/16/13 1419 07/16/13 2152 07/17/13 0559  BP: 104/76 109/83 117/83 111/54  Pulse: 100 67 118 88  Temp:  98.4 F (36.9 C) 98 F (36.7 C) 98.2 F (36.8 C)  TempSrc:  Oral Oral Oral  Resp: 18 20 18 18   Height:      Weight:    204 lb 11.2 oz (92.851 kg)  SpO2: 98% 100% 96% 98%    Intake/Output from previous day:  Intake/Output Summary (Last 24 hours) at 07/17/13 1118 Last data filed at 07/17/13 0943  Gross per 24 hour  Intake    180 ml  Output   1750 ml  Net  -1570 ml    PHYSICAL EXAM: No acute distress Neck: no JVD Cardiac:  normal S1, S2; RRR; no murmur Lungs:  Decreased breath sounds bilaterally, no wheezing, rhonchi or rales Abd: soft, nontender, no hepatomegaly Ext: no edema Skin: warm and dry Neuro:  CNs 2-12 intact, no focal abnormalities noted   Lab Results:  Basic Metabolic Panel:  Recent Labs  07/16/13 0525 07/17/13 0407  NA 143 143  K 4.0 3.7  CL 103 103  CO2 26 26  GLUCOSE 100* 116*  BUN 20 24*  CREATININE 1.35 1.40*  CALCIUM 8.8 8.8    CBC:  Recent Labs  07/14/13 1427  WBC 6.8  NEUTROABS 4.3  HGB 14.7  HCT 42.8  MCV 90.7  PLT 191    Cardiac Enzymes:  Recent Labs  07/14/13 2133 07/15/13 0217 07/15/13 0836  TROPONINI <0.30 <0.30 <0.30     Assessment/Plan:   1. Acute on Chronic Systolic CHF:  Volume improved.  He is down 12 lbs. And neg 3.38 L.  Consider transitioning IV to po Lasix.  2. 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  Continue beta blocker, ACEi. Consider adding Spironolactone if renal fxn will tolerate.  3. Intermittent AFlutter/MAT/ATach:  Beta blocker dose has been increased.  No beta blocker yet today.  His HR increased to 120s during injection of Myoview.  I have asked for his metoprolol to be given now. 4. 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 5. CHB s/p Pacemaker 6. HTN:  Controlled.  7. Disposition:  Lexiscan Myoview done today.  Pt complained of dyspnea with Lexiscan.  ECG v paced with prob sinus tach during injection.  Images pending.   Richardson Dopp, PA-C   07/17/2013 11:18 AM  Pager # [phone]

## 2013-07-18 DIAGNOSIS — I420 Dilated cardiomyopathy: Secondary | ICD-10-CM

## 2013-07-18 LAB — BASIC METABOLIC PANEL
BUN: 24 mg/dL — ABNORMAL HIGH (ref 6–23)
CO2: 26 mEq/L (ref 19–32)
Calcium: 8.9 mg/dL (ref 8.4–10.5)
Chloride: 103 mEq/L (ref 96–112)
Creatinine, Ser: 1.33 mg/dL (ref 0.50–1.35)
GFR calc Af Amer: 65 mL/min — ABNORMAL LOW (ref >90)
GFR, EST NON AFRICAN AMERICAN: 56 mL/min — AB (ref >90)
GLUCOSE: 114 mg/dL — AB (ref 70–99)
Potassium: 4.1 mEq/L (ref 3.7–5.3)
SODIUM: 142 meq/L (ref 137–147)

## 2013-07-18 MED ORDER — ASPIRIN EC 81 MG PO TBEC
81.0000 mg | DELAYED_RELEASE_TABLET | Freq: Every day | ORAL | Status: DC
  Administered 2013-07-18 – 2013-07-19 (×2): 81 mg via ORAL
  Filled 2013-07-18 (×2): qty 1

## 2013-07-18 MED ORDER — FUROSEMIDE 20 MG PO TABS
20.0000 mg | ORAL_TABLET | Freq: Every day | ORAL | Status: DC
  Administered 2013-07-18: 20 mg via ORAL
  Filled 2013-07-18: qty 1

## 2013-07-18 MED ORDER — FUROSEMIDE 10 MG/ML IJ SOLN
80.0000 mg | Freq: Every day | INTRAMUSCULAR | Status: DC
  Administered 2013-07-18 – 2013-07-19 (×2): 80 mg via INTRAVENOUS
  Filled 2013-07-18 (×2): qty 8

## 2013-07-18 MED ORDER — SPIRONOLACTONE 12.5 MG HALF TABLET
12.5000 mg | ORAL_TABLET | Freq: Every day | ORAL | Status: DC
  Administered 2013-07-18 – 2013-07-19 (×2): 12.5 mg via ORAL
  Filled 2013-07-18 (×2): qty 1

## 2013-07-18 NOTE — Progress Notes (Signed)
Family Medicine Teaching Service Daily Progress Note Intern Pager: [phone]  Patient name: Philip Chavez Medical record number: [id number] Date of birth: [birth year] Age: 63 y.o. Gender: male  Primary Care Provider: Thersa Salt, DO Consultants: None Code Status: Full  Pt Overview and Major Events to Date:  6/10: Admitted for acute CHF; wt: 216 lbs. 6/11: Wt: 207 lbs. 6/12: Echo markedly worsened from February 6/13: Lexiscan mvoview: Hypokinesis in large scarred area without reversibility. EF 17%  Assessment and Plan: [name] is a 63 y.o. male presenting with shortness of breath. 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   Acute CHF, combined systolic and diastolic: Pro-BNP: 8921, pulmonary edema with effusions as well as borderline cardiomegaly on CXR. Echo now showing marked decrease in EF (20-25%), diffuse hypokinesis, Grade 3 diastolic dysfunction. Myoview: Hypokinesis in large scarred area without reversibility. EF 17% - Cardiology following; we appreciate their help in managing this patient.  - Continue Metoprolol 75 mg BID; Lisinopril 5mg , Lasix 20mg  po, Spironolactone 12.5mg , ASA 81mg  - Continue to follow daily weights and I/O's.   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 Multiple small clots without evidence of right heart strain on CTA. Recent LE doppler with "acute" DVTs but doesn't mention any new clots from previous study.  -Xarelto to 15mg  BID x3 weeks started 6/12  Rise in creatinine - 1.35 > 1.40 > 1.33 - Likely secondary to ACEI: Continue ACE - Will continue to trend.  Atrial tachycardia: Interrogation of pacemaker, increased beta blocker dose per cardiology recommendations.   Shoulder/ankle pain: Avoid NSAIDs. Tx: tylenol 1,000mg  q6h prn  HTN: Normotensive, continue home metoprolol 75mg  BID, Lisinopril added 6/12.   Allergic rhinitis: Continue flonase    Insomnia: Ambien  FEN/GI: Saline lock IV, heart healthy (low sodium) diet  Prophylaxis: On xarelto 15mg  BID  Disposition: Await cardiology recommendations, possibly cardiac catheterization.   Subjective:  Pt feels "great" this morning, has been walking around more. No CP or SOB. Does not recall any symptoms of MI.   Objective: Temp:  [97.2 F (36.2 C)-98.2 F (36.8 C)] 98.2 F (36.8 C) (06/14 0523) Pulse Rate:  [83-121] 94 (06/14 0523) Resp:  [18-24] 20 (06/14 0523) BP: (108-125)/(76-87) 114/85 mmHg (06/14 0523) SpO2:  [98 %-100 %] 100 % (06/14 0523) Weight:  [204 lb 12.9 oz (92.9 kg)] 204 lb 12.9 oz (92.9 kg) (06/14 0523)  Physical Exam: Gen: Pleasant well-appearing 62 y.o.male in NAD Pulm: Non-labored on ambient air; CTAB, no wheezes  CV: Regular rate and rhythm, no murmur appreciated, no JVD; distal pulses intact/symmetric GI: +BS; soft, non-tender, non-distended Skin: No rashes, wounds, ulcers Neuro: A&Ox3, CN II-XII without deficits  Laboratory:  Recent Labs Lab 07/14/13 1427  WBC 6.8  HGB 14.7  HCT 42.8  PLT 191    Recent Labs Lab 07/16/13 0525 07/17/13 0407 07/18/13 0439  NA 143 143 142  K 4.0 3.7 4.1  CL 103 103 103  CO2 26 26 26   BUN 20 24* 24*  CREATININE 1.35 1.40* 1.33  CALCIUM 8.8 8.8 8.9  GLUCOSE 100* 116* 114*   PROBNP  7574.0*    2D ECHOCARDIOGRAM 03/12/2013  - Left ventricle: The cavity size was mildly dilated. Wall thickness was increased in a pattern of mild LVH. Systolic function was normal. The estimated ejection fraction was in the range of 50% to 55%. Wall motion was normal; there were no regional wall motion abnormalities.  Doppler parameters are consistent with high ventricular filling pressure. - Aortic valve: Mild regurgitation. - Right atrium: The atrium was mildly dilated.   Imaging/Diagnostic Tests: LOWER EXTREMITY DOPPLER 07/08/2013  Findings consistent with acute deep vein thrombosis involving the left femoral,  popliteal, posterior tibial, and peroneal veins. The DVT noted in the left common femoral and profunda veins on 03-11-13 appear resolved.   CXR 07/14/2013  There is a left chest wall pacer device with lead in the right  atrial appendage and right ventricle. There is mild cardiac  enlargement. Bilateral pleural effusions and pulmonary vascular  congestion is identified. No airspace consolidation.   EKG: paced rhythm  CT CHEST ANGIOGRAM Subsegmental level right lower lobe pulmonary emboli. No larger  pulmonary emboli.  Evidence of a degree of congestive heart failure. Right base  consolidation is present. Pacemaker leads are attached to the right  atrium and right ventricle.  No apparent right heart strain.  ECHO Study Conclusions - Left ventricle: The cavity size was severely dilated. Systolic function was severely reduced. The estimated ejection fraction was in the range of 20% to 25%. Diffuse hypokinesis. There was a reduced contribution of atrial contraction to ventricular filling, due to increased ventricular diastolic pressure or atrial contractile dysfunction. Doppler parameters are consistent with a reversible restrictive pattern, indicative of decreased left ventricular diastolic compliance and/or increased left atrial pressure (grade 3 diastolic dysfunction). - Aortic valve: There was mild regurgitation. - Aorta: Mildly dilated aortia at Sinuses of valsalva measuring 4.1cm. - Mitral valve: There was mild regurgitation. - Left atrium: The atrium was mildly dilated. - Right ventricle: The cavity size was moderately dilated. Wall thickness was normal. Systolic function was severely reduced. - Right atrium: The atrium was moderately dilated. - Tricuspid valve: There was moderate regurgitation. - Pulmonary arteries: PA peak pressure: 56 mm Hg (S). - Inferior vena cava: The vessel was dilated. The respirophasic diameter changes were blunted (< 50%), consistent with  elevated central venous pressure. - Impressions: The right ventricular systolic pressure was increased consistent with moderate pulmonary hypertension.  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 No significant reversibility.  Severely diminished left ventricular ejection fraction of 17%.  Vance Gather, MD 07/18/2013, 7:27 AM PGY-1, [place] Intern pager: [phone], text pages welcome

## 2013-07-18 NOTE — Progress Notes (Signed)
Patient ID: Philip Chavez, male   DOB: 11/22/1950, 63 y.o.   MRN: [id number]    SUBJECTIVE:  Patient is feeling better. He is still volume overloaded. His nuclear scan showed significant scar but no definite ischemia.   Filed Vitals:   07/17/13 1137 07/17/13 1300 07/17/13 2220 07/18/13 0523  BP: 125/81 110/76 108/78 114/85  Pulse: 112 83 83 94  Temp:  97.4 F (36.3 C) 97.2 F (36.2 C) 98.2 F (36.8 C)  TempSrc:  Oral Oral Oral  Resp: 24 18 20 20   Height:      Weight:    204 lb 12.9 oz (92.9 kg)  SpO2:  100% 98% 100%     Intake/Output Summary (Last 24 hours) at 07/18/13 1301 Last data filed at 07/18/13 0957  Gross per 24 hour  Intake    780 ml  Output    425 ml  Net    355 ml    LABS: Basic Metabolic Panel:  Recent Labs  07/17/13 0407 07/18/13 0439  NA 143 142  K 3.7 4.1  CL 103 103  CO2 26 26  GLUCOSE 116* 114*  BUN 24* 24*  CREATININE 1.40* 1.33  CALCIUM 8.8 8.9   Liver Function Tests: No results found for this basename: AST, ALT, ALKPHOS, BILITOT, PROT, ALBUMIN,  in the last 72 hours No results found for this basename: LIPASE, AMYLASE,  in the last 72 hours CBC: No results found for this basename: WBC, NEUTROABS, HGB, HCT, MCV, PLT,  in the last 72 hours Cardiac Enzymes: No results found for this basename: CKTOTAL, CKMB, CKMBINDEX, TROPONINI,  in the last 72 hours BNP: No components found with this basename: POCBNP,  D-Dimer: No results found for this basename: DDIMER,  in the last 72 hours Hemoglobin A1C: No results found for this basename: HGBA1C,  in the last 72 hours Fasting Lipid Panel: No results found for this basename: CHOL, HDL, LDLCALC, TRIG, CHOLHDL, LDLDIRECT,  in the last 72 hours Thyroid Function Tests: No results found for this basename: TSH, T4TOTAL, FREET3, T3FREE, THYROIDAB,  in the last 72 hours  RADIOLOGY: Dg Chest 2 View  07/14/2013   CLINICAL DATA:  Shortness of breath.  Right-sided chest pain  EXAM: CHEST  2 VIEW  COMPARISON:   03/16/2013  FINDINGS: There is a left chest wall pacer device with lead in the right atrial appendage and right ventricle. There is mild cardiac enlargement. Bilateral pleural effusions and pulmonary vascular congestion is identified. No airspace consolidation.  IMPRESSION: 1. Mild CHF.   Electronically Signed   By: Philip Chavez M.D.   On: 07/14/2013 15:59   Ct Angio Chest Pe W/cm &/or Wo Cm  07/14/2013   CLINICAL DATA:  Shortness of breath and chest pain  EXAM: CT ANGIOGRAPHY CHEST WITH CONTRAST  TECHNIQUE: Multidetector CT imaging of the chest was performed using the standard protocol during bolus administration of intravenous contrast. Multiplanar CT image reconstructions and MIPs were obtained to evaluate the vascular anatomy.  CONTRAST:  162mL OMNIPAQUE IOHEXOL 350 MG/ML SOLN  COMPARISON:  Chest CT March 11, 2013 and chest radiograph July 14, 2013  FINDINGS: There are small right lower lobes subsegmental level pulmonary emboli. No larger pulmonary emboli are identified. There is no demonstrable right heart strain.  There is no appreciable thoracic aortic aneurysm.  There are moderate pleural effusions bilaterally. There is consolidation in the right lower lobe with patchy atelectasis in the left lower lobe.  There is ill-defined opacity in the left perihilar region  which appears to be localized alveolar edema. There is mild interstitial edema in the lower lobes bilaterally. Heart is enlarged. Pericardium is not thickened.  There is no appreciable thoracic adenopathy.  Visualized upper abdominal structures appear unremarkable. There are no blastic or lytic bone lesions. Pacemaker leads are attached to the right atrium and right ventricle. The thyroid appears unremarkable.  Review of the MIP images confirms the above findings.  IMPRESSION: Subsegmental level right lower lobe pulmonary emboli. No larger pulmonary emboli.  Evidence of a degree of congestive heart failure. Right base consolidation is present.  Pacemaker leads are attached to the right atrium and right ventricle.  No apparent right heart strain.  Critical Value/emergent results were called by telephone at the time of interpretation on 07/14/2013 at 5:15 PM to Dr. Francine Chavez , who verbally acknowledged these results.   Electronically Signed   By: Philip Chavez M.D.   On: 07/14/2013 17:19   Nm Myocar Multi W/spect W/wall Motion / Ef  07/17/2013   CLINICAL DATA:  Left ventricular failure  EXAM: MYOCARDIAL IMAGING WITH SPECT (REST AND PHARMACOLOGIC-STRESS)  GATED LEFT VENTRICULAR WALL MOTION STUDY  LEFT VENTRICULAR EJECTION FRACTION  TECHNIQUE: Standard myocardial SPECT imaging was performed after resting intravenous injection of 10 mCi Tc-76m sestamibi. Subsequently, intravenous infusion of Lexiscan was performed under the supervision of the Cardiology staff. At peak effect of the drug, 30 mCi Tc-25m sestamibi was injected intravenously and standard myocardial SPECT imaging was performed. Quantitative gated imaging was also performed to evaluate left ventricular wall motion, and estimate left ventricular ejection fraction.  COMPARISON:  None.  FINDINGS: SPECT imaging demonstrates Large fixed defect involving the inferior wall, lateral wall and apex compatible with old infarct/ scar. No significant reversibility.  Quantitative gated analysis shows severe generalized hypokinesia with akinesia in the area of scar.  The resting left ventricular ejection fraction is 10% with end-diastolic volume of 175 ml and end-systolic volume of 102 ml.  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 No significant reversibility.  Severely diminished left ventricular ejection fraction of 17%.   Electronically Signed   By: Philip Chavez M.D.   On: 07/17/2013 13:13    PHYSICAL EXAM  patient is oriented to person time and place. Affect is normal. There are few scattered rales. There is no respiratory distress. Cardiac exam  reveals S1 and S2. The abdomen is soft. He still has 1-2+ peripheral edema.   TELEMETRY: I have reviewed telemetry today July 18, 2013. There is atrial tracking and pacing. The rate is 100.   ASSESSMENT AND PLAN:    Acute systolic CHF (congestive heart failure)    The patient is still volume overloaded. I will change his diuretics to IV.     DVT (deep venous thrombosis)   Embolism, pulmonary with infarction     The concern about this has been outlined previously. Patient is being treated with aggressive anticoagulation dosing.    Third degree heart block   Pacemaker    Pacemaker has been interrogated this admission. Over time and want to increase the beta blocker dose.      Congestive dilated cardiomyopathy    The patient has severe left ventricular dysfunction. He etiology is not completely clear. The plan at this point he is to not proceed with cardiac catheterization. The plan will be to treat his pulmonary emboli. The plan will be to treat his volume status with diuresis watching his renal function. The plan will be to use  ACE inhibitors and beta blockers and spironolactone giving the patient time to see if he has improvement in LV function before further testing.   Dola Argyle 07/18/2013 1:01 PM

## 2013-07-18 NOTE — Progress Notes (Signed)
Seen and examined.  Discussed with Dr. Bonner Puna.  Agree with his management and documentation.  He is symptomatically much improved.  By latest cards note, no cath is planned.  He is not aware of any previous MI.  Anticipate DC soon since no further inpatient testing is planned.

## 2013-07-19 ENCOUNTER — Ambulatory Visit: Payer: BC Managed Care – PPO | Admitting: Family Medicine

## 2013-07-19 ENCOUNTER — Other Ambulatory Visit: Payer: Self-pay | Admitting: Physician Assistant

## 2013-07-19 DIAGNOSIS — I428 Other cardiomyopathies: Secondary | ICD-10-CM

## 2013-07-19 DIAGNOSIS — N289 Disorder of kidney and ureter, unspecified: Secondary | ICD-10-CM

## 2013-07-19 LAB — BASIC METABOLIC PANEL
BUN: 22 mg/dL (ref 6–23)
CHLORIDE: 99 meq/L (ref 96–112)
CO2: 27 mEq/L (ref 19–32)
CREATININE: 1.23 mg/dL (ref 0.50–1.35)
Calcium: 8.9 mg/dL (ref 8.4–10.5)
GFR calc Af Amer: 71 mL/min — ABNORMAL LOW (ref >90)
GFR calc non Af Amer: 61 mL/min — ABNORMAL LOW (ref >90)
Glucose, Bld: 94 mg/dL (ref 70–99)
Potassium: 3.8 mEq/L (ref 3.7–5.3)
Sodium: 140 mEq/L (ref 137–147)

## 2013-07-19 MED ORDER — ASPIRIN 81 MG PO TBEC: 81.0000 mg | DELAYED_RELEASE_TABLET | Freq: Every day | ORAL | Status: DC

## 2013-07-19 MED ORDER — SPIRONOLACTONE 25 MG PO TABS: 12.5000 mg | ORAL_TABLET | Freq: Every day | ORAL | Status: DC

## 2013-07-19 MED ORDER — FUROSEMIDE 40 MG PO TABS
40.0000 mg | ORAL_TABLET | Freq: Two times a day (BID) | ORAL | Status: DC
  Filled 2013-07-19 (×2): qty 1

## 2013-07-19 MED ORDER — RIVAROXABAN 15 MG PO TABS: 15.0000 mg | ORAL_TABLET | Freq: Two times a day (BID) | ORAL | Status: DC

## 2013-07-19 MED ORDER — ATORVASTATIN CALCIUM 80 MG PO TABS: 80.0000 mg | ORAL_TABLET | Freq: Every day | ORAL | Status: DC

## 2013-07-19 MED ORDER — METOPROLOL TARTRATE 25 MG PO TABS: 75.0000 mg | ORAL_TABLET | Freq: Two times a day (BID) | ORAL | Status: DC

## 2013-07-19 MED ORDER — FUROSEMIDE 40 MG PO TABS: 40.0000 mg | ORAL_TABLET | Freq: Two times a day (BID) | ORAL | Status: DC

## 2013-07-19 MED ORDER — LISINOPRIL 5 MG PO TABS: 5.0000 mg | ORAL_TABLET | Freq: Every day | ORAL | Status: DC

## 2013-07-19 NOTE — Progress Notes (Signed)
Family Medicine Teaching Service Daily Progress Note Intern Pager: [phone]  Patient name: Philip Chavez Medical record number: [id number] Date of birth: November 13, 1950 Age: 63 y.o. Gender: male  Primary Care Provider: Thersa Salt, DO Consultants: None Code Status: Full  Pt Overview and Major Events to Date:  6/10: Admitted for acute CHF; wt: 216 lbs. 6/11: Wt: 207 lbs. 6/12: Echo markedly worsened from February 6/13: Lexiscan mvoview: Hypokinesis in large scarred area without reversibility. EF 17% 6/14: Still volume overloaded, increased lasix  Assessment and Plan: Philip Chavez is a 63 y.o. male presenting with shortness of breath. 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   Acute CHF, combined systolic and diastolic: Pro-BNP: 8592. Echo: EF 92-44%, grade 3 diastolic dysfunction. Myoview: EF 17%, hypokinesis in large scarred area without reversibility.  - Cardiology following; we appreciate their help in managing this patient.  - Continue medical management: metoprolol 75 mg BID; lisinopril 5mg , spironolactone 12.5mg , ASA 81mg  - Weight down to 90kg today (98kg on admission); would decrease lasix dose pending cardiology recommendations.  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 Multiple small clots without evidence of right heart strain on CTA. Recent LE doppler with "acute" DVTs but doesn't mention any new clots from previous study.  -Xarelto to 15mg  BID x3 weeks started 6/12  Rise in creatinine - 1.35 > 1.40 > 1.33 - Likely secondary to ACEI: Continue ACE - Will continue to trend.  Atrial tachycardia: Interrogation of pacemaker, increased beta blocker dose per cardiology recommendations.   Shoulder/ankle pain: Avoid NSAIDs. Tx: tylenol 1,000mg  q6h prn  HTN: Normotensive, continue home metoprolol 75mg  BID, Lisinopril added 6/12.   Allergic rhinitis: Continue flonase    Insomnia: Ambien  FEN/GI: Saline lock IV, heart healthy (low sodium) diet  Prophylaxis: On xarelto 15mg  BID  Disposition: Await cardiology recommendations, possibly D/C today  Subjective:  Symptomatically very improved. No CP or SOB.   Objective: Temp:  [97.4 F (36.3 C)-97.9 F (36.6 C)] 97.9 F (36.6 C) (06/15 0521) Pulse Rate:  [67-92] 88 (06/15 0521) Resp:  [17-20] 17 (06/15 0521) BP: (101-127)/(73-82) 127/82 mmHg (06/15 0521) SpO2:  [97 %-100 %] 97 % (06/15 0521) Weight:  [199 lb 4.7 oz (90.4 kg)] 199 lb 4.7 oz (90.4 kg) (06/15 0521)  Physical Exam: Gen: Pleasant well-appearing 62 y.o.male in NAD Pulm: Non-labored on ambient air; CTAB, no wheezes  CV: Regular rate and rhythm, no murmur appreciated, no JVD; distal pulses intact/symmetric; LE edema L > R 1+ GI: +BS; soft, non-tender, non-distended Skin: No rashes, wounds, ulcers Neuro: A&Ox3, CN II-XII without deficits  Laboratory:  Recent Labs Lab 07/14/13 1427  WBC 6.8  HGB 14.7  HCT 42.8  PLT 191    Recent Labs Lab 07/17/13 0407 07/18/13 0439 07/19/13 0350  NA 143 142 140  K 3.7 4.1 3.8  CL 103 103 99  CO2 26 26 27   BUN 24* 24* 22  CREATININE 1.40* 1.33 1.23  CALCIUM 8.8 8.9 8.9  GLUCOSE 116* 114* 94   PROBNP  7574.0*    2D ECHOCARDIOGRAM 03/12/2013  - Left ventricle: The cavity size was mildly dilated. Wall thickness was increased in a pattern of mild LVH. Systolic function was normal. The estimated ejection fraction was in the range of 50% to 55%. Wall motion was normal; there were no regional wall motion abnormalities. Doppler parameters are consistent with high ventricular filling pressure. - Aortic valve: Mild regurgitation. -  Right atrium: The atrium was mildly dilated.  Imaging/Diagnostic Tests: LOWER EXTREMITY DOPPLER 07/08/2013  Findings consistent with acute deep vein thrombosis involving the left femoral, popliteal, posterior tibial, and peroneal veins. The DVT noted in the left  common femoral and profunda veins on 03-11-13 appear resolved.   CXR 07/14/2013  There is a left chest wall pacer device with lead in the right  atrial appendage and right ventricle. There is mild cardiac  enlargement. Bilateral pleural effusions and pulmonary vascular  congestion is identified. No airspace consolidation.   EKG: paced rhythm  CT CHEST ANGIOGRAM Subsegmental level right lower lobe pulmonary emboli. No larger  pulmonary emboli.  Evidence of a degree of congestive heart failure. Right base  consolidation is present. Pacemaker leads are attached to the right  atrium and right ventricle.  No apparent right heart strain.  ECHO Study Conclusions - Left ventricle: The cavity size was severely dilated. Systolic function was severely reduced. The estimated ejection fraction was in the range of 20% to 25%. Diffuse hypokinesis. There was a reduced contribution of atrial contraction to ventricular filling, due to increased ventricular diastolic pressure or atrial contractile dysfunction. Doppler parameters are consistent with a reversible restrictive pattern, indicative of decreased left ventricular diastolic compliance and/or increased left atrial pressure (grade 3 diastolic dysfunction). - Aortic valve: There was mild regurgitation. - Aorta: Mildly dilated aortia at Sinuses of valsalva measuring 4.1cm. - Mitral valve: There was mild regurgitation. - Left atrium: The atrium was mildly dilated. - Right ventricle: The cavity size was moderately dilated. Wall thickness was normal. Systolic function was severely reduced. - Right atrium: The atrium was moderately dilated. - Tricuspid valve: There was moderate regurgitation. - Pulmonary arteries: PA peak pressure: 56 mm Hg (S). - Inferior vena cava: The vessel was dilated. The respirophasic diameter changes were blunted (< 50%), consistent with elevated central venous pressure. - Impressions: The right ventricular systolic  pressure was increased consistent with moderate pulmonary hypertension.  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 No significant reversibility.  Severely diminished left ventricular ejection fraction of 17%.  Vance Gather, MD 07/19/2013, 6:54 AM PGY-1, [facility] Intern pager: [phone], text pages welcome

## 2013-07-19 NOTE — Progress Notes (Signed)
Utilization Review Completed Ramla Hase J. Symon Norwood, RN, BSN, NCM [phone]  

## 2013-07-19 NOTE — Progress Notes (Addendum)
Patient seen and examined and above note reviewed. Agree with note as outlined by Perry Mount PA-C.  His lungs are clear and no pedal edema.  He weight is down and he is net negative.  Appears euvolemic.  Will change back to PO Lasix today.  OK to d/c home today from cardiac standpoint.  Will need early cardiac followup in clinic.  Would have patient get BMET in office on Thursday.

## 2013-07-19 NOTE — Progress Notes (Signed)
FMTS Attending Daily Note:  Philip Sabal MD  [phone] pager  Family Practice pager:  [phone] I have discussed this patient with the resident Dr. Bonner Puna.  I agree with their findings, assessment, and care plan.  FU with cardiology outpt arranged.  Planning to DC home today.

## 2013-07-19 NOTE — Progress Notes (Signed)
Patient Name: Philip Chavez Date of Encounter: 07/19/2013     Principal Problem:   Acute systolic CHF (congestive heart failure) Active Problems:   DVT (deep venous thrombosis)   Embolism, pulmonary with infarction   Third degree heart block   Pacemaker   Dyspnea   Congestive dilated cardiomyopathy    SUBJECTIVE  No SOB, orthopnea or LE edema. Feeling great.    CURRENT MEDS . aspirin EC  81 mg Oral Daily  . atorvastatin  80 mg Oral q1800  . furosemide  80 mg Intravenous Daily  . lisinopril  5 mg Oral Daily  . metoprolol  75 mg Oral BID  . rivaroxaban  15 mg Oral BID  . spironolactone  12.5 mg Oral Daily    OBJECTIVE  Filed Vitals:   07/18/13 0523 07/18/13 1400 07/18/13 2107 07/19/13 0521  BP: 114/85 106/78 101/73 127/82  Pulse: 94 92 67 88  Temp: 98.2 F (36.8 C) 97.4 F (36.3 C) 97.9 F (36.6 C)   TempSrc: Oral Oral Oral Oral  Resp: 20 20 18 17   Height:      Weight: 204 lb 12.9 oz (92.9 kg)   199 lb 4.7 oz (90.4 kg)  SpO2: 100% 99% 100% 97%    Intake/Output Summary (Last 24 hours) at 07/19/13 0823 Last data filed at 07/19/13 0526  Gross per 24 hour  Intake    960 ml  Output   3425 ml  Net  -2465 ml   Filed Weights   07/17/13 0559 07/18/13 0523 07/19/13 0521  Weight: 204 lb 11.2 oz (92.851 kg) 204 lb 12.9 oz (92.9 kg) 199 lb 4.7 oz (90.4 kg)    PHYSICAL EXAM  General: Pleasant, NAD. Neuro: Alert and oriented X 3. Moves all extremities spontaneously. Psych: Normal affect. HEENT:  Normal  Neck: Supple without bruits. Mild JVD. Lungs:  Resp regular and unlabored. Mild crackles at bases.  Heart: RRR no s3, s4, or murmurs. Abdomen: Soft, non-tender, non-distended, BS + x 4.  Extremities: No clubbing, cyanosis or edema. DP/PT/Radials 2+ and equal bilaterally. Trace edema L>R.   Accessory Clinical Findings  Basic Metabolic Panel  Recent Labs  07/18/13 0439 07/19/13 0350  NA 142 140  K 4.1 3.8  CL 103 99  CO2 26 27  GLUCOSE 114* 94  BUN  24* 22  CREATININE 1.33 1.23  CALCIUM 8.9 8.9    TELE  V Paced. Normal rate   Radiology/Studies  Dg Chest 2 View  07/14/2013   CLINICAL DATA:  Shortness of breath.  Right-sided chest pain  EXAM: CHEST  2 VIEW  COMPARISON:  03/16/2013  FINDINGS: There is a left chest wall pacer device with lead in the right atrial appendage and right ventricle. There is mild cardiac enlargement. Bilateral pleural effusions and pulmonary vascular congestion is identified. No airspace consolidation.  IMPRESSION: 1. Mild CHF.   Electronically Signed   By: Kerby Moors M.D.   On: 07/14/2013 15:59   Ct Angio Chest Pe W/cm &/or Wo Cm  07/14/2013   CLINICAL DATA:  Shortness of breath and chest pain  EXAM: CT ANGIOGRAPHY CHEST WITH CONTRAST  TECHNIQUE: Multidetector CT imaging of the chest was performed using the standard protocol during bolus administration of intravenous contrast. Multiplanar CT image reconstructions and MIPs were obtained to evaluate the vascular anatomy.  CONTRAST:  154mL OMNIPAQUE IOHEXOL 350 MG/ML SOLN  COMPARISON:  Chest CT March 11, 2013 and chest radiograph July 14, 2013  FINDINGS: There are small right lower lobes  subsegmental level pulmonary emboli. No larger pulmonary emboli are identified. There is no demonstrable right heart strain.  There is no appreciable thoracic aortic aneurysm.  There are moderate pleural effusions bilaterally. There is consolidation in the right lower lobe with patchy atelectasis in the left lower lobe.  There is ill-defined opacity in the left perihilar region which appears to be localized alveolar edema. There is mild interstitial edema in the lower lobes bilaterally. Heart is enlarged. Pericardium is not thickened.  There is no appreciable thoracic adenopathy.  Visualized upper abdominal structures appear unremarkable. There are no blastic or lytic bone lesions. Pacemaker leads are attached to the right atrium and right ventricle. The thyroid appears unremarkable.   Review of the MIP images confirms the above findings.  IMPRESSION: Subsegmental level right lower lobe pulmonary emboli. No larger pulmonary emboli.  Evidence of a degree of congestive heart failure. Right base consolidation is present. Pacemaker leads are attached to the right atrium and right ventricle.  No apparent right heart strain.  Critical Value/emergent results were called by telephone at the time of interpretation on 07/14/2013 at 5:15 PM to Dr. Francine Graven , who verbally acknowledged these results.    Venous doppler 07/08/13: Summary: - Findings consistent with acute deep vein thrombosis involving the left femoral, popliteal, posterior tibial, and peroneal veins. The DVT noted in the left common femoral and profunda veins on 03-11-13 appear resolved. - No evidence of Baker&'s cyst on the left.  Echo: 03/12/13:Study Conclusions - Left ventricle: The cavity size was mildly dilated. Wall thickness was increased in a pattern of mild LVH. Systolic function was normal. The estimated ejection fraction was in the range of 50% to 55%. Wall motion was normal; there were no regional wall motion abnormalities. Doppler parameters are consistent with high ventricular filling pressure. - Aortic valve: Mild regurgitation. - Right atrium: The atrium was mildly dilated.   2D ECHO: 07/15/2013 LV EF: 20% - 25% Study Conclusions - Left ventricle: The cavity size was severely dilated. Systolic function was severely reduced. The estimated ejection fraction was in the range of 20% to 25%. Diffuse hypokinesis. There was a reduced contribution of atrial contraction to ventricular filling, due to increased ventricular diastolic pressure or atrial contractile dysfunction. Doppler parameters are consistent with a reversible restrictive pattern, indicative of decreased left ventricular diastolic compliance and/or increased left atrial pressure (grade 3 diastolic dysfunction). - Aortic valve: There was  mild regurgitation. - Aorta: Mildly dilated aortia at Sinuses of valsalva measuring 4.1cm. - Mitral valve: There was mild regurgitation. - Left atrium: The atrium was mildly dilated. - Right ventricle: The cavity size was moderately dilated. Wall thickness was normal. Systolic function was severely reduced. - Right atrium: The atrium was moderately dilated. - Tricuspid valve: There was moderate regurgitation. - Pulmonary arteries: PA peak pressure: 56 mm Hg (S). - Inferior vena cava: The vessel was dilated. The respirophasic diameter changes were blunted (< 50%), consistent with elevated central venous pressure. - Impressions: The right ventricular systolic pressure was increased consistent with moderate pulmonary hypertension. Impressions: - The right ventricular systolic pressure was increased consistent with moderate pulmonary hypertension.    ASSESSMENT AND PLAN Philip Chavez is a 63 y.o. 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 Also ECHO with newly reduced EF (20-25%).  Acute CHF- systolic with elevated BNP (7.5K) and small pleural effusions.  -- ECHO in 03/2013 with EF 50-55%, now EF 20-25% with severely dilated LV. With mod pulm htn and increased R vent systolic pressure.  -- Neg 5.7L, weight down 216--> 199lbs -- Continue Lisinopril 5mg , Lopressor 75mg  BID and spironolactone 12.5 qd -- On Lasix 80mg  IV and feeling great. No SOB, orthopnea or LE edema. Consider changing to PO  Congestive dilated cardiomyopathy-- The patient has severe LV dysfunction. The etiology is not completely clear.  -- His nuclear scan showed significant  scar but no definite ischemia. EF17% -- The plan at this point he is to not proceed with cardiac catheterization. He will be treated for his  pulmonary emboli and his volume status managed with diuresis watching his renal function. -- Continue ACE, BB and spiro- hoping for improvement in LV function before further testing.  Recurrent PE/DVT- acute DVT left leg by doppler 07/08/13. Patient reports compliance with Xarelto.  -- Continue Xarelto 15 mg bid  -- Full hypercoagulability work up done in 2008 which was reportedly normal.   Complete heart block s/p pacemaker.   Intermittent atrial flutter/ MAT/ atrial tachycardia on monitor. -- None today. A couple episodes yesterday -- Metoprolol dose increased to 75mg  BID.  -- Pacemaker interrogated 07/15/13 with AP 2.1 % and VP 95.9%.   203 atrial high rate episode (2.3% mode switched). Longest duration 50 minutes.    10 Ventricular hight rate episodes. Most recent on 07/03/13. Longest duration was 8 seconds.    Ave V rate was 150 bpm.    E grams reveal V-A dissociation.   HTN-  Well controlled.   Tyrell Antonio PA-C  Pager [phone]

## 2013-07-22 ENCOUNTER — Other Ambulatory Visit: Payer: BC Managed Care – PPO

## 2013-07-22 NOTE — Discharge Summary (Signed)
Family Medicine Teaching Service  Discharge Note : Attending Jeff Makia Bossi MD Pager [phone] Inpatient Team Pager:  [phone]  I have reviewed this patient and the patient's chart and have discussed discharge planning with the resident at the time of discharge. I agree with the discharge plan as above.    

## 2013-07-26 ENCOUNTER — Encounter: Payer: Self-pay | Admitting: Sports Medicine

## 2013-07-26 ENCOUNTER — Ambulatory Visit (INDEPENDENT_AMBULATORY_CARE_PROVIDER_SITE_OTHER): Payer: BC Managed Care – PPO | Admitting: Sports Medicine

## 2013-07-26 VITALS — BP 100/52 | HR 102 | Temp 98.1°F | Ht 73.0 in | Wt 194.2 lb

## 2013-07-26 DIAGNOSIS — I5021 Acute systolic (congestive) heart failure: Secondary | ICD-10-CM

## 2013-07-26 DIAGNOSIS — I2699 Other pulmonary embolism without acute cor pulmonale: Secondary | ICD-10-CM

## 2013-07-26 DIAGNOSIS — I509 Heart failure, unspecified: Secondary | ICD-10-CM

## 2013-07-26 LAB — COMPREHENSIVE METABOLIC PANEL
ALBUMIN: 4.2 g/dL (ref 3.5–5.2)
ALT: 52 U/L (ref 0–53)
AST: 25 U/L (ref 0–37)
Alkaline Phosphatase: 90 U/L (ref 39–117)
BILIRUBIN TOTAL: 0.7 mg/dL (ref 0.2–1.2)
BUN: 19 mg/dL (ref 6–23)
CO2: 27 meq/L (ref 19–32)
Calcium: 9.5 mg/dL (ref 8.4–10.5)
Chloride: 102 mEq/L (ref 96–112)
Creat: 1.31 mg/dL (ref 0.50–1.35)
GLUCOSE: 89 mg/dL (ref 70–99)
Potassium: 4.2 mEq/L (ref 3.5–5.3)
SODIUM: 139 meq/L (ref 135–145)
TOTAL PROTEIN: 7.2 g/dL (ref 6.0–8.3)

## 2013-07-26 MED ORDER — RIVAROXABAN 20 MG PO TABS: 20.0000 mg | ORAL_TABLET | Freq: Every day | ORAL | Status: DC

## 2013-07-26 NOTE — Progress Notes (Signed)
  Philip Chavez - 63 y.o. male MRN [id number]  Date of birth: 02-06-1950  SUBJECTIVE:  Including CC & ROS.  Chief Complaint  Patient presents with  . Congestive Heart Failure    Hospital followup   patient reports feeling significantly better since discharge. Pt denies chest pain, dyspnea at rest or exertion, PND, lower extremity edema.  Reports getting back to his prior functional baseline and having no difficulty with his breathing.  No worsening muscle pain.  Denies any melena or hematochezia.   HISTORY: Past Medical, Surgical, Social, and Family History Reviewed & Updated per EMR. Pertinent Historical Findings include: Recent admission for congestive heart failure with change in medication with the addition of Spironolactone, ACE inhibitor and statin.  History of third-degree heart block status post pacemaker insertion.  On Xarelto therapy to transition on July 3 to 20 mg tablets.  DATA REVIEWED: Hospital discharge summary  PHYSICAL EXAM:  VS: BP:100/52 mmHg  HR:102bpm  TEMP:98.1 F (36.7 C)(Oral)  RESP:   HT:6\' 1"  (185.4 cm)   WT:194 lb 3.2 oz (88.089 kg)  BMI:25.7 PHYSICAL EXAM: GENERAL:  Adult,  African American male. In no discomfort; no respiratory distress  PSYCH:  alert and appropriate, good insight  HNEENT:  mmm, no JVD, no hepatojugular reflux CARDIAC:  RRR, S1/S2 heard, no murmur LUNGS:  CTA B, no wheezes, no crackles ABDOMEN:  Soft, EXTREM:  Warm, well perfused.  Moves all 4 extremities spontaneously; no lateralization.  Pedal pulses 1/4.  No pretibial edema.  ASSESSMENT & PLAN: See problem based charting & AVS for pt instructions.

## 2013-07-26 NOTE — Assessment & Plan Note (Signed)
Acute on chronic condition  - seems significantly better with markedly improve volume status since hospitalization.  Denies significant orthostatic symptoms although blood pressure marginally low. 1. Recheck lab work today will have to be done at [facility] outpatient lab given after 5 PM. 2. Return in one week for recheck of potassium either way

## 2013-07-26 NOTE — Patient Instructions (Signed)
Go to [facility] lab at [place] [facility] to get your blood drawn.  Remember to come back in 1 week to our office for repeat lab testing.  Follow up with Dr. Lacinda Axon in 2 weeks.  Remember to change your Xarelto dosing over on the 3rd.

## 2013-07-26 NOTE — Assessment & Plan Note (Signed)
Resolving chronic condition  - warrants lifelong anticoagulation.  Due to transitioned to Xarelto 20 mg on July 3. 1. New prescription for Xarelto 20 mg to start July 3.

## 2013-07-28 ENCOUNTER — Telehealth: Payer: Self-pay | Admitting: *Deleted

## 2013-07-28 NOTE — Telephone Encounter (Signed)
Message copied by Johny Shears on Wed Jul 28, 2013  8:49 AM ------      Message from: Teresa Coombs D      Created: Tue Jul 27, 2013  5:54 PM       Normal Potassium and Kidney function.  Please call pt and inform; still needs to repeat in 1 week ------

## 2013-07-28 NOTE — Telephone Encounter (Signed)
Spoke with patient and informed him of below 

## 2013-08-05 ENCOUNTER — Encounter: Payer: BC Managed Care – PPO | Admitting: Physician Assistant

## 2013-08-09 ENCOUNTER — Encounter: Payer: Self-pay | Admitting: Physician Assistant

## 2013-08-09 ENCOUNTER — Ambulatory Visit (INDEPENDENT_AMBULATORY_CARE_PROVIDER_SITE_OTHER): Payer: BC Managed Care – PPO | Admitting: Physician Assistant

## 2013-08-09 VITALS — BP 124/64 | HR 96 | Ht 73.0 in | Wt 199.0 lb

## 2013-08-09 DIAGNOSIS — I2699 Other pulmonary embolism without acute cor pulmonale: Secondary | ICD-10-CM

## 2013-08-09 DIAGNOSIS — I498 Other specified cardiac arrhythmias: Secondary | ICD-10-CM

## 2013-08-09 DIAGNOSIS — I509 Heart failure, unspecified: Secondary | ICD-10-CM

## 2013-08-09 DIAGNOSIS — I10 Essential (primary) hypertension: Secondary | ICD-10-CM

## 2013-08-09 DIAGNOSIS — I428 Other cardiomyopathies: Secondary | ICD-10-CM

## 2013-08-09 DIAGNOSIS — I420 Dilated cardiomyopathy: Secondary | ICD-10-CM

## 2013-08-09 DIAGNOSIS — I5021 Acute systolic (congestive) heart failure: Secondary | ICD-10-CM

## 2013-08-09 DIAGNOSIS — I471 Supraventricular tachycardia: Secondary | ICD-10-CM | POA: Insufficient documentation

## 2013-08-09 MED ORDER — METOPROLOL TARTRATE 50 MG PO TABS: 25.0000 mg | ORAL_TABLET | Freq: Two times a day (BID) | ORAL | Status: DC

## 2013-08-09 NOTE — Assessment & Plan Note (Signed)
Patient has new dilated cardiomyopathy ejection fraction 17% on Myoview, 20-25% on 2-D echo. No ischemia on Myoview. Patient has not had chest pain. Continue medical management at this point. Increase metoprolol to 50 mg 1-1/2 tablets twice a day. Followup with Dr. Lovena Le in 6 weeks

## 2013-08-09 NOTE — Assessment & Plan Note (Signed)
Patient had atrial tachycardia in the hospital and metoprolol was increased to 75 mg twice a day. The patient felt like he was taking too many pills so only took 2 tablets in the morning one in the evening for his total of 75 mg daily. I will increase him to 75 mg twice a day. Followup with Dr. Lovena Le in 6 weeks.

## 2013-08-09 NOTE — Progress Notes (Signed)
HPI: This is a 63 year old male patient who was admitted to the hospital with recurrent DVT and small pulmonary emboli while taking Xarelto. His Xarelto was increased to 15 mg twice a day for 3 weeks then return to 20 mg daily. He was also found to have severe LV dysfunction ejection fraction 20-25% with grade 3 diastolic dysfunction on 2-D echo Myoview showed a large scar and hypokinesis of inferior lateral walls including the apex EF 17%. Cardiac catheterization was deferred with recurrent pulmonary embolus and he was managed medically. He was placed on aspirin, ACE inhibitor, Lasix, and spiral lactone. Beta blocker was  increased dose due to atrial tachycardia.  Patient's initial DVT and pulmonary embolus was in 2007 following ankle surgery. He was admitted in February 2015 with recurrent proximal femoral DVT and PE. He developed complete heart block during that admission and had a pacemaker placed. 2-D echo showed EF of 50-55% in February.  Overall the patient is feeling well he denies any chest pain, palpitations, dyspnea, dyspnea on exertion, dizziness, or presyncope. His beta blocker was increased in the hospital but he thought it was too many pills. He is supposed to be taking 25 mg 3 tablets twice a day. He is only taking 2 tablets in the morning one in the evening. His heart rate is elevated in the office today at about 100 beats per minute.  No Known Allergies  Current Outpatient Prescriptions on File Prior to Visit: acetaminophen (TYLENOL) 500 MG tablet, Take 1,000 mg by mouth every 6 (six) hours as needed for mild pain., Disp: , Rfl:  aspirin EC 81 MG EC tablet, Take 1 tablet (81 mg total) by mouth daily., Disp: 30 tablet, Rfl: 0 atorvastatin (LIPITOR) 80 MG tablet, Take 1 tablet (80 mg total) by mouth daily at 6 PM., Disp: 30 tablet, Rfl: 0 fluticasone (FLONASE) 50 MCG/ACT nasal spray, Place 1 spray into both nostrils daily., Disp: 16 g, Rfl: 6 furosemide (LASIX) 40 MG tablet, Take 1  tablet (40 mg total) by mouth 2 (two) times daily., Disp: 60 tablet, Rfl: 0 lisinopril (PRINIVIL,ZESTRIL) 5 MG tablet, Take 1 tablet (5 mg total) by mouth daily., Disp: 30 tablet, Rfl: 0 metoprolol (LOPRESSOR) 25 MG tablet, Take 3 tablets (75 mg total) by mouth 2 (two) times daily., Disp: 180 tablet, Rfl: 0 Rivaroxaban (XARELTO) 15 MG TABS tablet, Take 1 tablet (15 mg total) by mouth 2 (two) times daily., Disp: 60 tablet, Rfl: 0 rivaroxaban (XARELTO) 20 MG TABS tablet, Take 1 tablet (20 mg total) by mouth daily with supper., Disp: 30 tablet, Rfl: 0 spironolactone (ALDACTONE) 25 MG tablet, Take 0.5 tablets (12.5 mg total) by mouth daily., Disp: 30 tablet, Rfl: 0 zolpidem (AMBIEN) 10 MG tablet, Take 1 tablet (10 mg total) by mouth at bedtime as needed for sleep., Disp: 15 tablet, Rfl: 1  No current facility-administered medications on file prior to visit.   Past Medical History:   DVT (deep venous thrombosis)                    2007; 2/2*     Comment:RLE: S/P foot OR; LLE   Embolism, pulmonary with infarction             2007; 2/2*     Comment:S/P foot OR; bilateral   Complete AV block  Comment:s/p MDT pacemaker   Third degree heart block                                     Hypercoagulable state                                        Musculoskeletal neck pain                                    Hypertension                                                Past Surgical History:   HERNIA REPAIR                                    1980's         Comment:"in my stomach"   FOOT FRACTURE SURGERY                           Right 2007         PACEMAKER INSERTION                              03/16/2013      Comment:MDT ADDRL1 pacemaker implanted by Dr Lovena Le for              complete heart block  No family history on file.   Social History   Marital Status: Married             Spouse Name:                      Years of Education:                 Number  of children:             Occupational History   None on file  Social History Main Topics   Smoking Status: Former Smoker                   Packs/Day: 1.50  Years: 10        Types: Cigarettes   Smokeless Status: Never Used                       Comment: 03/16/2013 "quit smoking 20-30 yr ago"   Alcohol Use: No             Drug Use: No             Sexual Activity: Yes                Other Topics            Concern   None on file  Social History Narrative   None on file    ROS: See history of present illness otherwise negative   PHYSICAL EXAM: Well-nournished, in no acute distress. Neck: No JVD, HJR,  Bruit, or thyroid enlargement  Lungs: No tachypnea, clear without wheezing, rales, or rhonchi  Cardiovascular: RRR at 100 beats per minute positive S4, 2/6 systolic murmur at the left sternal border, no bruit, thrill, or heave.  Abdomen: BS normal. Soft without organomegaly, masses, lesions or tenderness.  Extremities: without cyanosis, clubbing or edema. Good distal pulses bilateral  SKin: Warm, no lesions or rashes   Musculoskeletal: No deformities  Neuro: no focal signs  BP 124/64  Pulse 96  Ht 6\' 1"  (1.854 m)  Wt 199 lb (90.266 kg)  BMI 26.26 kg/m2    EKG: Ventricular paced at 96 beats per minute  2-D echo 07/15/13 Study Conclusions  - Left ventricle: The cavity size was severely dilated. Systolic   function was severely reduced. The estimated ejection fraction   was in the range of 20% to 25%. Diffuse hypokinesis. There was a   reduced contribution of atrial contraction to ventricular   filling, due to increased ventricular diastolic pressure or   atrial contractile dysfunction. Doppler parameters are consistent   with a reversible restrictive pattern, indicative of decreased   left ventricular diastolic compliance and/or increased left   atrial pressure (grade 3 diastolic dysfunction). - Aortic valve: There was mild regurgitation. - Aorta: Mildly dilated  aortia at Sinuses of valsalva measuring   4.1cm. - Mitral valve: There was mild regurgitation. - Left atrium: The atrium was mildly dilated. - Right ventricle: The cavity size was moderately dilated. Wall   thickness was normal. Systolic function was severely reduced. - Right atrium: The atrium was moderately dilated. - Tricuspid valve: There was moderate regurgitation. - Pulmonary arteries: PA peak pressure: 56 mm Hg (S). - Inferior vena cava: The vessel was dilated. The respirophasic   diameter changes were blunted (< 50%), consistent with elevated   central venous pressure. - Impressions: The right ventricular systolic pressure was   increased consistent with moderate pulmonary hypertension.  Impressions:  - The right ventricular systolic pressure was increased consistent   with moderate pulmonary hypertension.  Stress Myoview 07/17/13 IMPRESSION: Large old infarct/scar involving the inferior wall, lateral wall and apex. No significant reversibility.   Severely diminished left ventricular ejection fraction of 17%.     Electronically Signed   By: Rolm Baptise M.D.   On: 07/17/2013 13:13

## 2013-08-09 NOTE — Patient Instructions (Addendum)
Your physician recommends that you schedule a follow-up appointment in: WITH DR. Lovena Le IN 6 WEEKS  Your physician has recommended you make the following change in your medication:   TAKE YOUR METOPROLOL 25 MG THREE TABLETS IN THE MORNING AND THREE TABLETS IN THE EVENING  START METOPROLOL 50 MG (1/2 TABLETS 25 MG) TWICE A DAY  YOUR PROVIDER WOULD LIKE TO FOR YOU TO START A 2 GRAM SODIUM DIET

## 2013-08-09 NOTE — Assessment & Plan Note (Signed)
No evidence of heart failure on exam today. Continue 2 g sodium diet.

## 2013-08-09 NOTE — Assessment & Plan Note (Signed)
Patient had recurrent DVT and pulmonary embolus while taking Xarelto. He took 50 mg twice a day for 3 weeks and is now on 20 mg once daily. This will be lifelong for him.

## 2013-08-26 ENCOUNTER — Other Ambulatory Visit: Payer: Self-pay | Admitting: *Deleted

## 2013-08-26 MED ORDER — METOPROLOL TARTRATE 50 MG PO TABS: 25.0000 mg | ORAL_TABLET | Freq: Two times a day (BID) | ORAL | Status: DC

## 2013-08-26 MED ORDER — FUROSEMIDE 40 MG PO TABS: 40.0000 mg | ORAL_TABLET | Freq: Two times a day (BID) | ORAL | Status: DC

## 2013-08-26 MED ORDER — LISINOPRIL 5 MG PO TABS: 5.0000 mg | ORAL_TABLET | Freq: Every day | ORAL | Status: DC

## 2013-08-26 MED ORDER — SPIRONOLACTONE 25 MG PO TABS
12.5000 mg | ORAL_TABLET | Freq: Every day | ORAL | Status: DC
Start: 2013-08-26 — End: 2014-09-21

## 2013-08-26 MED ORDER — ATORVASTATIN CALCIUM 80 MG PO TABS: 80.0000 mg | ORAL_TABLET | Freq: Every day | ORAL | Status: DC

## 2013-08-26 MED ORDER — ASPIRIN 81 MG PO TBEC: 81.0000 mg | DELAYED_RELEASE_TABLET | Freq: Every day | ORAL | Status: DC

## 2013-08-26 NOTE — Telephone Encounter (Signed)
Left voice message for pt to leave a list of medication what medications he needed so I can send the request to PCP.  Derl Barrow, RN

## 2013-08-26 NOTE — Telephone Encounter (Signed)
Received message from pt stating he is out medication for about 1 week and needed a refill. Derl Barrow, RN

## 2013-08-26 NOTE — Telephone Encounter (Signed)
Pt is returning Tamika's call. jw

## 2013-08-26 NOTE — Telephone Encounter (Signed)
Pt stated he is out of medication.  Derl Barrow, RN

## 2013-08-30 ENCOUNTER — Telehealth: Payer: Self-pay | Admitting: Cardiology

## 2013-08-30 NOTE — Telephone Encounter (Signed)
He feels he is going to the bathroom all the time.  He does not have any SOB, or swelling at this time.  He is going to decrease his Furosemide to 40mg  daily until his follow up with Dr Lovena Le on 8/18.  He will continue to weigh himself daly and call if his weights begin to trend up prior to appointment

## 2013-08-30 NOTE — Telephone Encounter (Signed)
New message    patient calling discuss cutting back or discontinue furosemide 40 mg due to large amount of visit bathroom.

## 2013-09-07 ENCOUNTER — Other Ambulatory Visit: Payer: Self-pay | Admitting: *Deleted

## 2013-09-07 DIAGNOSIS — I2699 Other pulmonary embolism without acute cor pulmonale: Secondary | ICD-10-CM

## 2013-09-07 MED ORDER — RIVAROXABAN 20 MG PO TABS: 20.0000 mg | ORAL_TABLET | Freq: Every day | ORAL | Status: DC

## 2013-09-21 ENCOUNTER — Encounter: Payer: Self-pay | Admitting: Internal Medicine

## 2013-09-21 ENCOUNTER — Ambulatory Visit (INDEPENDENT_AMBULATORY_CARE_PROVIDER_SITE_OTHER): Payer: BC Managed Care – PPO | Admitting: Internal Medicine

## 2013-09-21 VITALS — BP 96/63 | HR 90 | Ht 72.0 in | Wt 204.0 lb

## 2013-09-21 DIAGNOSIS — N5201 Erectile dysfunction due to arterial insufficiency: Secondary | ICD-10-CM

## 2013-09-21 DIAGNOSIS — N529 Male erectile dysfunction, unspecified: Secondary | ICD-10-CM

## 2013-09-21 DIAGNOSIS — Z950 Presence of cardiac pacemaker: Secondary | ICD-10-CM

## 2013-09-21 DIAGNOSIS — I442 Atrioventricular block, complete: Secondary | ICD-10-CM

## 2013-09-21 LAB — MDC_IDC_ENUM_SESS_TYPE_INCLINIC
Battery Remaining Longevity: 128 mo
Brady Statistic AP VP Percent: 3 %
Brady Statistic AP VS Percent: 0 %
Brady Statistic AS VS Percent: 3 %
Lead Channel Impedance Value: 457 Ohm
Lead Channel Pacing Threshold Amplitude: 1 V
Lead Channel Pacing Threshold Amplitude: 1 V
Lead Channel Pacing Threshold Pulse Width: 0.4 ms
Lead Channel Sensing Intrinsic Amplitude: 4 mV
Lead Channel Sensing Intrinsic Amplitude: 5.6 mV
Lead Channel Setting Pacing Amplitude: 2 V
Lead Channel Setting Pacing Pulse Width: 0.4 ms
Lead Channel Setting Sensing Sensitivity: 4 mV
MDC IDC MSMT BATTERY IMPEDANCE: 100 Ohm
MDC IDC MSMT BATTERY VOLTAGE: 2.79 V
MDC IDC MSMT LEADCHNL RA IMPEDANCE VALUE: 479 Ohm
MDC IDC MSMT LEADCHNL RA PACING THRESHOLD PULSEWIDTH: 0.4 ms
MDC IDC SESS DTM: 20150818160503
MDC IDC SET LEADCHNL RV PACING AMPLITUDE: 2.5 V
MDC IDC STAT BRADY AS VP PERCENT: 94 %

## 2013-09-21 MED ORDER — SILDENAFIL CITRATE 50 MG PO TABS: 50.0000 mg | ORAL_TABLET | Freq: Every day | ORAL | Status: DC | PRN

## 2013-09-21 NOTE — Assessment & Plan Note (Signed)
I have recommended that the patient start Viagra. She will call as if he has problems. I've instructed to not take any nitroglycerin or nitrates.

## 2013-09-21 NOTE — Progress Notes (Signed)
HPI Philip Chavez returns today for followup. He is a 63 yo man with a h/o complete heart block and HTN, s/p PPM insertion. He has done well with no chest pain or sob. No syncope. He remains physically active. He c/o erectile dysfunction.  No Known Allergies   Current Outpatient Prescriptions  Medication Sig Dispense Refill  . aspirin 81 MG EC tablet Take 1 tablet (81 mg total) by mouth daily.  90 tablet  3  . atorvastatin (LIPITOR) 80 MG tablet Take 1 tablet (80 mg total) by mouth daily at 6 PM.  90 tablet  3  . furosemide (LASIX) 40 MG tablet Take 1 tablet (40 mg total) by mouth 2 (two) times daily.  180 tablet  3  . lisinopril (PRINIVIL,ZESTRIL) 5 MG tablet Take 1 tablet (5 mg total) by mouth daily.  90 tablet  3  . metoprolol (LOPRESSOR) 50 MG tablet Take 0.5 tablets (25 mg total) by mouth 2 (two) times daily.  90 tablet  3  . rivaroxaban (XARELTO) 20 MG TABS tablet Take 1 tablet (20 mg total) by mouth daily with supper.  30 tablet  6  . spironolactone (ALDACTONE) 25 MG tablet Take 0.5 tablets (12.5 mg total) by mouth daily.  90 tablet  3   No current facility-administered medications for this visit.     Past Medical History  Diagnosis Date  . DVT (deep venous thrombosis) 2007; 03/2013    RLE: S/P foot OR; LLE  . Embolism, pulmonary with infarction 2007; 03/2013    S/P foot OR; bilateral  . Complete AV block     s/p MDT pacemaker  . Third degree heart block   . Hypercoagulable state   . Musculoskeletal neck pain   . Hypertension     ROS:   All systems reviewed and negative except as noted in the HPI.   Past Surgical History  Procedure Laterality Date  . Hernia repair  1980's    "in my stomach"  . Foot fracture surgery Right 2007  . Pacemaker insertion  03/16/2013    MDT ADDRL1 pacemaker implanted by Dr Philip Chavez for complete heart block     No family history on file.   History   Social History  . Marital Status: Married    Spouse Name: N/A    Number of  Children: N/A  . Years of Education: N/A   Occupational History  . Not on file.   Social History Main Topics  . Smoking status: Former Smoker -- 1.50 packs/day for 10 years    Types: Cigarettes  . Smokeless tobacco: Never Used     Comment: 03/16/2013 "quit smoking 20-30 yr ago"  . Alcohol Use: No  . Drug Use: No  . Sexual Activity: Yes   Other Topics Concern  . Not on file   Social History Narrative  . No narrative on file     BP 96/63  Pulse 90  Ht 6' (1.829 m)  Wt 204 lb (92.534 kg)  BMI 27.66 kg/m2  Physical Exam:  Well appearing middle aged man,NAD HEENT: Unremarkable Neck:  No JVD, no thyromegally Back:  No CVA tenderness Lungs:  Clear with no wheezes, well healed PPM incision. HEART:  Regular rate rhythm, no murmurs, no rubs, no clicks Abd:  soft, positive bowel sounds, no organomegally, no rebound, no guarding Ext:  2 plus pulses, no edema, no cyanosis, no clubbing Skin:  No rashes no nodules Neuro:  CN II through XII intact, motor  grossly intact   DEVICE  Normal device function.  See PaceArt for details.   Assess/Plan: All all

## 2013-09-21 NOTE — Assessment & Plan Note (Signed)
His Medtronic dual-chamber pacemaker is working normally. We'll plan to recheck in several months.

## 2013-09-21 NOTE — Patient Instructions (Addendum)
Your physician has recommended you make the following change in your medication:  1) VIAGRA 50 MG AS DIRECTED-DAILY AS NEEDED  Your physician wants you to follow-up in: [place]. [place]. You will receive a reminder letter in the mail two months in advance. If you don't receive a letter, please call our office to schedule the follow-up appointment.  Remote monitoring is used to monitor your Pacemaker of ICD from home. This monitoring reduces the number of office visits required to check your device to one time per year. It allows [place] to keep an eye on the functioning of your device to ensure it is working properly. You are scheduled for a device check from home on 12/21/2013. You may send your transmission at any time that day. If you have a wireless device, the transmission will be sent automatically. After your physician reviews your transmission, you will receive a postcard with your next transmission date.

## 2013-10-22 ENCOUNTER — Encounter: Payer: Self-pay | Admitting: Internal Medicine

## 2013-10-25 ENCOUNTER — Telehealth: Payer: Self-pay | Admitting: Internal Medicine

## 2013-10-25 NOTE — Telephone Encounter (Addendum)
Patient stopped all medications but ASA 81mg  , Xarelto 20mg  at night and lasix 40mg  in the morning.  He says he doesn't feel he needs them and had been off for about two weeks and feels great.  I advised him that he should not stop any medications unless advised by a physician  He wishes to discuss with MD.  I have put him on Dr Tanna Furry schedule for 11/30/13 at 9:30am

## 2013-10-25 NOTE — Telephone Encounter (Signed)
New problem   Pt need to speak to nurse concerning stopping some medications. Please call pt.

## 2013-11-30 ENCOUNTER — Encounter: Payer: Self-pay | Admitting: Internal Medicine

## 2013-11-30 ENCOUNTER — Ambulatory Visit (INDEPENDENT_AMBULATORY_CARE_PROVIDER_SITE_OTHER): Payer: BC Managed Care – PPO | Admitting: Internal Medicine

## 2013-11-30 ENCOUNTER — Encounter: Payer: BC Managed Care – PPO | Admitting: Internal Medicine

## 2013-11-30 VITALS — BP 90/56 | HR 72 | Ht 73.0 in | Wt 204.4 lb

## 2013-11-30 DIAGNOSIS — I5021 Acute systolic (congestive) heart failure: Secondary | ICD-10-CM

## 2013-11-30 DIAGNOSIS — I420 Dilated cardiomyopathy: Secondary | ICD-10-CM

## 2013-11-30 DIAGNOSIS — Z950 Presence of cardiac pacemaker: Secondary | ICD-10-CM

## 2013-11-30 DIAGNOSIS — I471 Supraventricular tachycardia: Secondary | ICD-10-CM

## 2013-11-30 DIAGNOSIS — I442 Atrioventricular block, complete: Secondary | ICD-10-CM

## 2013-11-30 LAB — MDC_IDC_ENUM_SESS_TYPE_INCLINIC
Battery Remaining Longevity: 127 mo
Brady Statistic AP VP Percent: 4 %
Brady Statistic AP VS Percent: 0 %
Brady Statistic AS VP Percent: 94 %
Brady Statistic AS VS Percent: 2 %
Date Time Interrogation Session: 20151027125818
Lead Channel Impedance Value: 457 Ohm
Lead Channel Impedance Value: 493 Ohm
Lead Channel Pacing Threshold Amplitude: 0.75 V
Lead Channel Pacing Threshold Amplitude: 1 V
Lead Channel Pacing Threshold Pulse Width: 0.4 ms
Lead Channel Sensing Intrinsic Amplitude: 4 mV
Lead Channel Setting Pacing Amplitude: 2 V
MDC IDC MSMT BATTERY IMPEDANCE: 100 Ohm
MDC IDC MSMT BATTERY VOLTAGE: 2.79 V
MDC IDC MSMT LEADCHNL RV PACING THRESHOLD PULSEWIDTH: 0.4 ms
MDC IDC SET LEADCHNL RV PACING AMPLITUDE: 2.5 V
MDC IDC SET LEADCHNL RV PACING PULSEWIDTH: 0.4 ms
MDC IDC SET LEADCHNL RV SENSING SENSITIVITY: 4 mV

## 2013-11-30 NOTE — Assessment & Plan Note (Signed)
His Medtronic dual-chamber pacemaker is working normally. We'll plan to recheck in several months. He is pacing in the right ventricle 99% of the time.

## 2013-11-30 NOTE — Assessment & Plan Note (Signed)
The patient underwent pacemaker insertion approximately 8 months ago. Approximately 4 months ago, he was found to have acute systolic heart failure. His ejection fraction had gone from normal when he presented with complete heart block to 20%. He is been placed on medical therapy for his congestive heart failure. He feels better. He did stop his heart failure medications several weeks ago with no improvement in his erectile dysfunction. At this point, I would recommend that he undergo repeat 2-D echo in approximately 3 months. If his ejection fraction remains poor, we would consider insertion of a left ventricular pacing lead, or possibly upgrade to a biventricular ICD.

## 2013-11-30 NOTE — Patient Instructions (Signed)
Remote monitoring is used to monitor your Pacemaker of ICD from home. This monitoring reduces the number of office visits required to check your device to one time per year. It allows [place] to keep an eye on the functioning of your device to ensure it is working properly. You are scheduled for a device check from home on 03/03/14. You may send your transmission at any time that day. If you have a wireless device, the transmission will be sent automatically. After your physician reviews your transmission, you will receive a postcard with your next transmission date.  Your physician wants you to follow-up in: 1 year with Dr. Lovena Le. You will receive a reminder letter in the mail two months in advance. If you don't receive a letter, please call our office to schedule the follow-up appointment.  Your physician recommends that you continue on your current medications as directed. Please refer to the Current Medication list given to you today.

## 2013-11-30 NOTE — Progress Notes (Signed)
HPI [name] returns today for followup. He is a 63 yo man with a h/o complete heart block and HTN, s/p PPM insertion. He has done well with no chest pain or sob. No syncope. He remains physically active. He c/o erectile dysfunction. The patient stop taking all of his heart medications for approximately 2 weeks. He had no improvement in his erectile dysfunction. He considered trying Viagra, but the prescription was too expensive. No Known Allergies   Current Outpatient Prescriptions  Medication Sig Dispense Refill  . aspirin 81 MG EC tablet Take 1 tablet (81 mg total) by mouth daily.  90 tablet  3  . atorvastatin (LIPITOR) 80 MG tablet Take 1 tablet (80 mg total) by mouth daily at 6 PM.  90 tablet  3  . furosemide (LASIX) 40 MG tablet Take 1 tablet (40 mg total) by mouth 2 (two) times daily.  180 tablet  3  . lisinopril (PRINIVIL,ZESTRIL) 5 MG tablet Take 1 tablet (5 mg total) by mouth daily.  90 tablet  3  . metoprolol (LOPRESSOR) 50 MG tablet Take 0.5 tablets (25 mg total) by mouth 2 (two) times daily.  90 tablet  3  . rivaroxaban (XARELTO) 20 MG TABS tablet Take 1 tablet (20 mg total) by mouth daily with supper.  30 tablet  6  . spironolactone (ALDACTONE) 25 MG tablet Take 0.5 tablets (12.5 mg total) by mouth daily.  90 tablet  3   No current facility-administered medications for this visit.     Past Medical History  Diagnosis Date  . DVT (deep venous thrombosis) 2007; 03/2013    RLE: S/P foot OR; LLE  . Embolism, pulmonary with infarction 2007; 03/2013    S/P foot OR; bilateral  . Complete AV block     s/p MDT pacemaker  . Third degree heart block   . Hypercoagulable state   . Musculoskeletal neck pain   . Hypertension     ROS:   All systems reviewed and negative except as noted in the HPI.   Past Surgical History  Procedure Laterality Date  . Hernia repair  1980's    "in my stomach"  . Foot fracture surgery Right 2007  . Pacemaker insertion  03/16/2013   MDT ADDRL1 pacemaker implanted by Dr Lovena Le for complete heart block     No family history on file.   History   Social History  . Marital Status: Married    Spouse Name: N/A    Number of Children: N/A  . Years of Education: N/A   Occupational History  . Not on file.   Social History Main Topics  . Smoking status: Former Smoker -- 1.50 packs/day for 10 years    Types: Cigarettes  . Smokeless tobacco: Never Used     Comment: 03/16/2013 "quit smoking 20-30 yr ago"  . Alcohol Use: No  . Drug Use: No  . Sexual Activity: Yes   Other Topics Concern  . Not on file   Social History Narrative  . No narrative on file     BP 90/56  Pulse 72  Ht 6\' 1"  (1.854 m)  Wt 204 lb 6.4 oz (92.715 kg)  BMI 26.97 kg/m2  Physical Exam:  Well appearing middle aged man,NAD HEENT: Unremarkable Neck:  No JVD, no thyromegally Back:  No CVA tenderness Lungs:  Clear with no wheezes, well healed PPM incision. HEART:  Regular rate rhythm, no murmurs, no rubs, no clicks Abd:  soft, positive bowel sounds,  no organomegally, no rebound, no guarding Ext:  2 plus pulses, no edema, no cyanosis, no clubbing Skin:  No rashes no nodules Neuro:  CN II through XII intact, motor grossly intact   DEVICE  Normal device function.  See PaceArt for details.   Assess/Plan: All all

## 2013-12-03 ENCOUNTER — Telehealth: Payer: Self-pay | Admitting: Internal Medicine

## 2013-12-03 NOTE — Telephone Encounter (Signed)
Received request from Nurse fax box, documents faxed for surgical clearance. To: [facility] Fax number: [phone] Attention: 10/30.15/km

## 2013-12-15 ENCOUNTER — Telehealth: Payer: Self-pay | Admitting: *Deleted

## 2013-12-15 NOTE — Telephone Encounter (Signed)
Spoke with patient and ifnroemd him that he will need to be seen first being that he has only seen Dr. Lacinda Axon 1 time and that was in 04/2013. Patient stated "Dr. Lacinda Axon told me to call in whenever I needed pain medication and he will given me whatever I needed." Will fwd to Dr. Lacinda Axon

## 2013-12-15 NOTE — Telephone Encounter (Signed)
Pt calls and is requesting some pain medication from Dr. Lacinda Axon.   States that he is having pain in his neck.  Advised to patient that an appt will be needed since he has not seen Dr. Lacinda Axon since March.  Pt is upset by this and wants me to ask Dr. Lacinda Axon anyway and declines appt.  Advised I would send message to Dr. Geralynn Ochs CMA. [place], Salome Spotted

## 2013-12-16 MED ORDER — TRAMADOL HCL 50 MG PO TABS
50.0000 mg | ORAL_TABLET | Freq: Three times a day (TID) | ORAL | Status: DC | PRN
Start: 2013-12-16 — End: 2014-08-29

## 2013-12-16 NOTE — Telephone Encounter (Signed)
Patient has been on Tramadol.  I will refill it for him; I would also recommend that if his pain continues/persists he should be seen.   Rx can be pick up at his convenience.

## 2014-01-13 ENCOUNTER — Encounter (HOSPITAL_COMMUNITY): Payer: Self-pay | Admitting: Internal Medicine

## 2014-03-03 ENCOUNTER — Telehealth: Payer: Self-pay | Admitting: Cardiology

## 2014-03-03 ENCOUNTER — Ambulatory Visit (INDEPENDENT_AMBULATORY_CARE_PROVIDER_SITE_OTHER): Payer: BC Managed Care – PPO | Admitting: *Deleted

## 2014-03-03 DIAGNOSIS — I442 Atrioventricular block, complete: Secondary | ICD-10-CM

## 2014-03-03 LAB — MDC_IDC_ENUM_SESS_TYPE_REMOTE
Battery Impedance: 100 Ohm
Battery Remaining Longevity: 128 mo
Brady Statistic AP VP Percent: 3 %
Brady Statistic AS VP Percent: 96 %
Brady Statistic AS VS Percent: 1 %
Lead Channel Impedance Value: 461 Ohm
Lead Channel Pacing Threshold Amplitude: 1 V
Lead Channel Pacing Threshold Pulse Width: 0.4 ms
Lead Channel Sensing Intrinsic Amplitude: 2.8 mV
Lead Channel Setting Pacing Amplitude: 2 V
Lead Channel Setting Pacing Pulse Width: 0.4 ms
MDC IDC MSMT BATTERY VOLTAGE: 2.79 V
MDC IDC MSMT LEADCHNL RA IMPEDANCE VALUE: 479 Ohm
MDC IDC MSMT LEADCHNL RA PACING THRESHOLD PULSEWIDTH: 0.4 ms
MDC IDC MSMT LEADCHNL RV PACING THRESHOLD AMPLITUDE: 1.125 V
MDC IDC SESS DTM: 20160128175542
MDC IDC SET LEADCHNL RV PACING AMPLITUDE: 2.5 V
MDC IDC SET LEADCHNL RV SENSING SENSITIVITY: 4 mV
MDC IDC STAT BRADY AP VS PERCENT: 0 %

## 2014-03-03 NOTE — Progress Notes (Signed)
Remote pacemaker transmission.   

## 2014-03-03 NOTE — Telephone Encounter (Signed)
LMOVM reminding pt to send remote transmission.   

## 2014-03-09 ENCOUNTER — Encounter: Payer: Self-pay | Admitting: Cardiology

## 2014-04-12 ENCOUNTER — Encounter: Payer: Self-pay | Admitting: Family Medicine

## 2014-04-12 ENCOUNTER — Ambulatory Visit (INDEPENDENT_AMBULATORY_CARE_PROVIDER_SITE_OTHER): Payer: BC Managed Care – PPO | Admitting: Family Medicine

## 2014-04-12 ENCOUNTER — Ambulatory Visit (HOSPITAL_COMMUNITY)
Admission: RE | Admit: 2014-04-12 | Discharge: 2014-04-12 | Disposition: A | Discharge status: Home / Self Care | Payer: BC Managed Care – PPO | Source: Ambulatory Visit | Attending: Family Medicine | Admitting: Family Medicine

## 2014-04-12 VITALS — BP 116/79 | HR 118 | Temp 98.1°F | Ht 73.0 in | Wt 202.9 lb

## 2014-04-12 DIAGNOSIS — R05 Cough: Secondary | ICD-10-CM | POA: Diagnosis present

## 2014-04-12 DIAGNOSIS — J189 Pneumonia, unspecified organism: Secondary | ICD-10-CM

## 2014-04-12 LAB — CBC
HCT: 47.1 % (ref 39.0–52.0)
Hemoglobin: 15.7 g/dL (ref 13.0–17.0)
MCH: 31.3 pg (ref 26.0–34.0)
MCHC: 33.3 g/dL (ref 30.0–36.0)
MCV: 94 fL (ref 78.0–100.0)
MPV: 12 fL (ref 8.6–12.4)
Platelets: 168 K/uL (ref 150–400)
RBC: 5.01 MIL/uL (ref 4.22–5.81)
RDW: 15.6 % — ABNORMAL HIGH (ref 11.5–15.5)
WBC: 6.4 K/uL (ref 4.0–10.5)

## 2014-04-12 LAB — BASIC METABOLIC PANEL WITH GFR
BUN: 19 mg/dL (ref 6–23)
CO2: 23 meq/L (ref 19–32)
Calcium: 9.1 mg/dL (ref 8.4–10.5)
Chloride: 98 meq/L (ref 96–112)
Creat: 1.35 mg/dL (ref 0.50–1.35)
Glucose, Bld: 146 mg/dL — ABNORMAL HIGH (ref 70–99)
Potassium: 4.5 meq/L (ref 3.5–5.3)
Sodium: 136 meq/L (ref 135–145)

## 2014-04-12 MED ORDER — CEFTRIAXONE SODIUM 1 G IJ SOLR
1.0000 g | Freq: Once | INTRAMUSCULAR | Status: AC
  Administered 2014-04-12: 1 g via INTRAMUSCULAR

## 2014-04-12 MED ORDER — AZITHROMYCIN 250 MG PO TABS: ORAL_TABLET | ORAL | Status: DC

## 2014-04-12 NOTE — Progress Notes (Signed)
   Subjective:    Patient ID: Philip Chavez, male    DOB: [birth year], 64 y.o.   MRN: [id number]  HPI Patient seen today for SDA, complaint of 3 days' cough with copious sputum, and subjective fever with sweats yesterday morning around 9am.  Has been coughing throughout the day and night; no blood in sputum. No chest pain or dyspnea. Has history of CHF with ECHO in June 2015 showing EF 20-25% and dilated cardiomyopathy, also with pacer placement and followed by Dr [name].  Reports he has had decreased appetite and low energy in the past few days. No sick contacts.  Did not get the flu shot this year.  Records show he did have pneumovax in June 2015.   Patient reports that he has been taking his medications as prescribed; in reviewing his meds, he reports taking furosemide 40mg  one tablet DAILY, rather than the instructed twice daily.   ROS: No chest pain, no dyspnea.  Sleeps flat in bed, no extra pillows.  No ankle swelling now, although he has had in the past. Fevers/chills as described above (once, yesterday). No changes in bowel habits, no dysuria. Reports longstanding erectile dysfunction .   SOC Hx; Quit cigarettes, alcohol and "experimental drugs" about 35 years ago.  Review of Systems     Objective:   Physical Exam Generally well-appearing, speaking fluidly and in no visible distress HEENT Neck supple. No appreciable JVD. Moist mucus membranes. No cervical adenopathy. Nasal mucosa with mild watery rhinorrhea. TMs clear bilat. COR reg S1S2 PULM Distinctive crackles heard in RLL with diffuse rhonchi.  Rales not appreciate on the L lung field. Good air movement.  EXTS: No appreciable ankle adema.  Able to get up to exam table without difficulty.        Assessment & Plan:

## 2014-04-12 NOTE — Assessment & Plan Note (Signed)
3 days of cough with sputum; reported fever at home; exam with RLL rales.  For CXR today, as well as CBC with diff and BMet.   Will treat empirically with ceftriaxone 1g IM today; azithromycin 250mg  tabs, 2 tabs by mouth on Day One, then 1 tab daily for 4 days.  Follow up within 1 week in [facility].  Keep track of weight at home.

## 2014-04-12 NOTE — Patient Instructions (Signed)
It was a pleasure to see you today.   I am concerned that you may have a pneumonia. For this, we are giving you a shot of an antibiotic called CEFTRIAXONE 1g, and a prescription (sent to your pharmacy) for  AZITHROMYCIN 250mg , TAKE 2 TABLETS BY MOUTH ON THE FIRST DAY, THEN 1 TABLET DAILY FOR 4 MORE DAYS.   Labwork being drawn today.   Please go get a chest x-ray directly after this visit.   I will contact you with results to your cell [phone].   PLEASE KEEP TRACK OF YOUR DAILY WEIGHTS AT HOME.   It is important that you schedule a follow up visit with your cardiologist, Dr Lovena Le, for follow up on your heart failure.   FOLLOW UP IN THE [facility] IN THE COMING 1 WEEK.

## 2014-04-13 ENCOUNTER — Telehealth: Payer: Self-pay | Admitting: Family Medicine

## 2014-04-13 NOTE — Telephone Encounter (Signed)
Called and spoke with patient about lab results and CXR which confirmed the R lung pneumonia. He reports he is feeling much better, picked up the azithro prescription and is taking this morning.  I asked him to schedule follow up in [facility] within the week, and that he needs to be sure to call his cardiologist's office to schedule follow up for his heart failure. According to Oct 2015 note, it looks like he may be due for a repeat ECHO now. Discussed this with him, he agrees to call and schedule both appointments.  JB

## 2014-04-14 ENCOUNTER — Encounter: Payer: Self-pay | Admitting: Internal Medicine

## 2014-04-25 ENCOUNTER — Encounter: Payer: Self-pay | Admitting: Family Medicine

## 2014-04-25 ENCOUNTER — Ambulatory Visit (INDEPENDENT_AMBULATORY_CARE_PROVIDER_SITE_OTHER): Payer: BC Managed Care – PPO | Admitting: Family Medicine

## 2014-04-25 VITALS — BP 95/76 | HR 99 | Temp 98.3°F | Ht 73.0 in | Wt 199.3 lb

## 2014-04-25 DIAGNOSIS — J189 Pneumonia, unspecified organism: Secondary | ICD-10-CM

## 2014-04-25 DIAGNOSIS — I5042 Chronic combined systolic (congestive) and diastolic (congestive) heart failure: Secondary | ICD-10-CM

## 2014-04-25 MED ORDER — FUROSEMIDE 40 MG PO TABS: 40.0000 mg | ORAL_TABLET | Freq: Every day | ORAL | Status: DC

## 2014-04-25 NOTE — Assessment & Plan Note (Signed)
Patient doing well and compliant with therapy. Patient is due for an echocardiogram and I encouraged him to follow-up with his cardiologist soon.

## 2014-04-25 NOTE — Assessment & Plan Note (Signed)
Now resolved and patient is doing well.

## 2014-04-25 NOTE — Progress Notes (Signed)
   Subjective:    Patient ID: Philip Chavez, male    DOB: [birth year], 64 y.o.   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  1) CAP  Patient was seen on 3/8 with complaints of shortness of breath, productive cough, and subjective fever.  Chest x-ray was obtained and revealed multifocal pneumonia.  Patient was treated with Rocephin 1 and started on azithromycin.  He presents today for follow-up.  He has completed his antibody course and is feeling well.  He currently denies shortness of breath, cough, fever, increasing lower extremity edema. No recent chest pain.  Social Hx - Nonsmoker.  Review of Systems Per HPI    Objective:   Physical Exam Filed Vitals:   04/25/14 0924  BP: 95/76  Pulse: 99  Temp: 98.3 F (36.8 C)   Exam: General: well appearing, NAD. Cardiovascular: RRR. No murmurs, rubs, or gallops. Respiratory: CTAB. No rales, rhonchi, or wheeze. Extremities: No LE edema.    Assessment & Plan:  See Problem List

## 2014-04-25 NOTE — Patient Instructions (Signed)
I'm glad you're doing well.  Continue your medications as prescribed.   Please call your cardiologist to set up an appointment.  Take care  Dr. Lacinda Axon

## 2014-04-28 ENCOUNTER — Encounter: Payer: Self-pay | Admitting: Internal Medicine

## 2014-04-28 ENCOUNTER — Ambulatory Visit (INDEPENDENT_AMBULATORY_CARE_PROVIDER_SITE_OTHER): Payer: BC Managed Care – PPO | Admitting: Internal Medicine

## 2014-04-28 VITALS — BP 110/64 | HR 94 | Ht 73.0 in | Wt 200.0 lb

## 2014-04-28 DIAGNOSIS — Z950 Presence of cardiac pacemaker: Secondary | ICD-10-CM

## 2014-04-28 DIAGNOSIS — N522 Drug-induced erectile dysfunction: Secondary | ICD-10-CM | POA: Diagnosis not present

## 2014-04-28 DIAGNOSIS — E785 Hyperlipidemia, unspecified: Secondary | ICD-10-CM

## 2014-04-28 DIAGNOSIS — I480 Paroxysmal atrial fibrillation: Secondary | ICD-10-CM

## 2014-04-28 DIAGNOSIS — I5042 Chronic combined systolic (congestive) and diastolic (congestive) heart failure: Secondary | ICD-10-CM

## 2014-04-28 DIAGNOSIS — I442 Atrioventricular block, complete: Secondary | ICD-10-CM

## 2014-04-28 DIAGNOSIS — I4891 Unspecified atrial fibrillation: Secondary | ICD-10-CM | POA: Insufficient documentation

## 2014-04-28 MED ORDER — SIMVASTATIN 20 MG PO TABS: 20.0000 mg | ORAL_TABLET | Freq: Every day | ORAL | Status: DC

## 2014-04-28 MED ORDER — SILDENAFIL CITRATE 100 MG PO TABS: 100.0000 mg | ORAL_TABLET | ORAL | Status: DC | PRN

## 2014-04-28 NOTE — Assessment & Plan Note (Signed)
He will stop lipitor and switch to simvastatin. He will maintain a low cholesterol diet.

## 2014-04-28 NOTE — Assessment & Plan Note (Signed)
He is NSR 99% of the time and is asymptomatic. He will continue his current meds.

## 2014-04-28 NOTE — Assessment & Plan Note (Signed)
His medtronic DDD PM is working normally. Will recheck in several months. 

## 2014-04-28 NOTE — Progress Notes (Signed)
HPI Philip Chavez returns today for followup. He is a 64 yo man with a h/o complete heart block and HTN, s/p PPM insertion. He has done well with no chest pain or sob. No syncope. He remains physically active. He c/o erectile dysfunction. The patient stop taking all of his heart medications for approximately 2 weeks. He had no improvement in his erectile dysfunction. He considered trying Viagra, but the prescription was too expensive. Since we saw him last, he did not fill his prescription. No other complaints. He thinks his lipitor is responsible for his ED. No Known Allergies   Current Outpatient Prescriptions  Medication Sig Dispense Refill  . aspirin 81 MG EC tablet Take 1 tablet (81 mg total) by mouth daily. 90 tablet 3  . furosemide (LASIX) 40 MG tablet Take 1 tablet (40 mg total) by mouth daily. 180 tablet 3  . lisinopril (PRINIVIL,ZESTRIL) 5 MG tablet Take 1 tablet (5 mg total) by mouth daily. 90 tablet 3  . metoprolol (LOPRESSOR) 50 MG tablet Take 0.5 tablets (25 mg total) by mouth 2 (two) times daily. 90 tablet 3  . rivaroxaban (XARELTO) 20 MG TABS tablet Take 1 tablet (20 mg total) by mouth daily with supper. 30 tablet 6  . spironolactone (ALDACTONE) 25 MG tablet Take 0.5 tablets (12.5 mg total) by mouth daily. 90 tablet 3  . traMADol (ULTRAM) 50 MG tablet Take 1-2 tablets (50-100 mg total) by mouth every 8 (eight) hours as needed. 60 tablet 0  . azithromycin (ZITHROMAX) 250 MG tablet Take 2 tablets by mouth on Day one, then take 1 tablet by mouth daily for the next 4 days (Patient not taking: Reported on 04/28/2014) 6 tablet 0  . sildenafil (VIAGRA) 100 MG tablet Take 1 tablet (100 mg total) by mouth as needed for erectile dysfunction. 10 tablet 1  . simvastatin (ZOCOR) 20 MG tablet Take 1 tablet (20 mg total) by mouth daily at 6 PM. 90 tablet 3   No current facility-administered medications for this visit.     Past Medical History  Diagnosis Date  . DVT (deep venous  thrombosis) 2007; 03/2013    RLE: S/P foot OR; LLE  . Embolism, pulmonary with infarction 2007; 03/2013    S/P foot OR; bilateral  . Complete AV block     s/p MDT pacemaker  . Third degree heart block   . Hypercoagulable state   . Musculoskeletal neck pain   . Hypertension     ROS:   All systems reviewed and negative except as noted in the HPI.   Past Surgical History  Procedure Laterality Date  . Hernia repair  1980's    "in my stomach"  . Foot fracture surgery Right 2007  . Pacemaker insertion  03/16/2013    MDT ADDRL1 pacemaker implanted by Dr Lovena Le for complete heart block  . Permanent pacemaker insertion N/A 03/15/2013    Procedure: PERMANENT PACEMAKER INSERTION;  Surgeon: Evans Lance, MD;  Location: [facility] CATH LAB;  Service: Cardiovascular;  Laterality: N/A;     Family History  Problem Relation Age of Onset  . Diabetes type II Mother   . Heart disease Father      History   Social History  . Marital Status: Married    Spouse Name: N/A  . Number of Children: N/A  . Years of Education: N/A   Occupational History  . Not on file.   Social History Main Topics  . Smoking status: Former Smoker --  1.50 packs/day for 10 years    Types: Cigarettes  . Smokeless tobacco: Never Used     Comment: 03/16/2013 "quit smoking 20-30 yr ago"  . Alcohol Use: No  . Drug Use: No  . Sexual Activity: Yes   Other Topics Concern  . Not on file   Social History Narrative     BP 110/64 mmHg  Pulse 94  Ht 6\' 1"  (1.854 m)  Wt 200 lb (90.719 kg)  BMI 26.39 kg/m2  Physical Exam:  Well appearing middle aged man,NAD HEENT: Unremarkable Neck:  No JVD, no thyromegally Back:  No CVA tenderness Lungs:  Clear with no wheezes, well healed PPM incision. HEART:  Regular rate rhythm, no murmurs, no rubs, no clicks Abd:  soft, positive bowel sounds, no organomegally, no rebound, no guarding Ext:  2 plus pulses, no edema, no cyanosis, no clubbing Skin:  No rashes no nodules Neuro:   CN II through XII intact, motor grossly intact   DEVICE  Normal device function.  See PaceArt for details.   Assess/Plan: All all

## 2014-04-28 NOTE — Patient Instructions (Signed)
Your physician has recommended you make the following change in your medication:   1. STOP Lipitor 2. START Simvastatin 20 mg by mouth daily 3. START Viagra 100 mg by mouth as needed  Your physician wants you to follow-up in: 12 months with Dr. Lovena Le. You will receive a reminder letter in the mail two months in advance. If you don't receive a letter, please call our office to schedule the follow-up appointment.  Remote monitoring is used to monitor your Pacemaker of ICD from home. This monitoring reduces the number of office visits required to check your device to one time per year. It allows [place] to keep an eye on the functioning of your device to ensure it is working properly. You are scheduled for a device check from home on 07/28/2014. You may send your transmission at any time that day. If you have a wireless device, the transmission will be sent automatically. After your physician reviews your transmission, you will receive a postcard with your next transmission date.

## 2014-04-28 NOTE — Assessment & Plan Note (Signed)
He initially did not have his prescription filled. I have encouraged him to try the Viagra. He has a new prescription.

## 2014-04-28 NOTE — Assessment & Plan Note (Signed)
His symptoms are class 2A. We will have him recheck his echo in 6 months and I will see him after. If his EF remains poor, then we would consider a biv device.

## 2014-06-08 ENCOUNTER — Other Ambulatory Visit: Payer: Self-pay | Admitting: Family Medicine

## 2014-06-08 NOTE — Telephone Encounter (Signed)
Needs xarelto refilled [facility]

## 2014-06-09 NOTE — Telephone Encounter (Signed)
Pt called again at [place].   He needs his medicine. He is completely out and has to take it every day

## 2014-06-09 NOTE — Telephone Encounter (Signed)
Pt is calling and checking the status of his request for Xarelto. He is out. Blima Rich

## 2014-07-28 ENCOUNTER — Ambulatory Visit (INDEPENDENT_AMBULATORY_CARE_PROVIDER_SITE_OTHER): Payer: BC Managed Care – PPO | Admitting: *Deleted

## 2014-07-28 ENCOUNTER — Encounter: Payer: Self-pay | Admitting: Internal Medicine

## 2014-07-28 DIAGNOSIS — I442 Atrioventricular block, complete: Secondary | ICD-10-CM

## 2014-07-28 NOTE — Progress Notes (Signed)
Remote pacemaker transmission.   

## 2014-07-31 LAB — CUP PACEART REMOTE DEVICE CHECK
Battery Impedance: 113 Ohm
Battery Voltage: 2.79 V
Brady Statistic AP VP Percent: 2 %
Date Time Interrogation Session: 20160623121342
Lead Channel Impedance Value: 459 Ohm
Lead Channel Impedance Value: 463 Ohm
Lead Channel Pacing Threshold Amplitude: 1 V
Lead Channel Pacing Threshold Amplitude: 1.125 V
Lead Channel Sensing Intrinsic Amplitude: 2.8 mV
Lead Channel Setting Pacing Amplitude: 2 V
Lead Channel Setting Pacing Amplitude: 2.5 V
Lead Channel Setting Sensing Sensitivity: 4 mV
MDC IDC MSMT BATTERY REMAINING LONGEVITY: 123 mo
MDC IDC MSMT LEADCHNL RA PACING THRESHOLD PULSEWIDTH: 0.4 ms
MDC IDC MSMT LEADCHNL RV PACING THRESHOLD PULSEWIDTH: 0.4 ms
MDC IDC SET LEADCHNL RV PACING PULSEWIDTH: 0.4 ms
MDC IDC STAT BRADY AP VS PERCENT: 0 %
MDC IDC STAT BRADY AS VP PERCENT: 96 %
MDC IDC STAT BRADY AS VS PERCENT: 1 %

## 2014-08-09 ENCOUNTER — Telehealth: Payer: Self-pay | Admitting: Family Medicine

## 2014-08-09 ENCOUNTER — Encounter: Payer: Self-pay | Admitting: Cardiology

## 2014-08-09 DIAGNOSIS — I2699 Other pulmonary embolism without acute cor pulmonale: Secondary | ICD-10-CM

## 2014-08-09 MED ORDER — RIVAROXABAN 20 MG PO TABS: 20.0000 mg | ORAL_TABLET | Freq: Every day | ORAL | Status: DC

## 2014-08-09 NOTE — Telephone Encounter (Signed)
Refill sent to pharmacy.   

## 2014-08-09 NOTE — Telephone Encounter (Signed)
Pt called and needs a refill on his Xarelto called in to his pharmacy. [facility]. jw

## 2014-08-29 ENCOUNTER — Ambulatory Visit (INDEPENDENT_AMBULATORY_CARE_PROVIDER_SITE_OTHER): Payer: BC Managed Care – PPO | Admitting: Family Medicine

## 2014-08-29 ENCOUNTER — Encounter: Payer: Self-pay | Admitting: Family Medicine

## 2014-08-29 ENCOUNTER — Telehealth: Payer: Self-pay | Admitting: Family Medicine

## 2014-08-29 VITALS — BP 108/79 | HR 98 | Wt 199.0 lb

## 2014-08-29 DIAGNOSIS — G479 Sleep disorder, unspecified: Secondary | ICD-10-CM | POA: Diagnosis not present

## 2014-08-29 DIAGNOSIS — F419 Anxiety disorder, unspecified: Secondary | ICD-10-CM | POA: Diagnosis not present

## 2014-08-29 NOTE — Progress Notes (Signed)
   Subjective:    Patient ID: Philip Chavez, male    DOB: Nov 20, 1950, 64 y.o.   MRN: [id number]  Seen for Same day visit for   CC: trouble sleeping  Patient comes in today with reports of anxiety and trouble sleeping due to racing thoughts at night.  Thoughts are usually of concern for his medical condition and whether he should be taking all the medications he is currently on.  He has questions about whether he can stop his blood pressure medications and Xarelto.  Also reports some decreases in appetite and difficulties with erectile dysfunction, not improved with Viagra.   Review of Systems   See HPI for ROS. Objective:  BP 108/79 mmHg  Pulse 98  Wt 199 lb (90.266 kg)  General: NAD Cardiac: RRR, normal heart sounds, no murmurs. 2+ radial and PT pulses bilaterally Respiratory: CTAB, normal effort    Assessment & Plan:  See Problem List Documentation

## 2014-08-29 NOTE — Telephone Encounter (Signed)
Would like printout of lab results mailed to him

## 2014-08-29 NOTE — Assessment & Plan Note (Signed)
Patient's anxiety and difficult to sleep in, syncope related to his concern over his medical condition possible side effects from medications.  Discussed all medications and the importance with patient and provided reassurance that he is on appropriate medical therapy.  - Recommended he follow-up with his cardiologist with his questions about erectile dysfunction and whether this could be related to his metoprolol.  Could consider switching to Coreg or bisoprolol - He will follow-up with his PCP in 3-4 weeks.  If his anxiety and difficulty sleeping have not resolved at that time, would recommend checking TSH and consider starting SSRI

## 2014-08-29 NOTE — Telephone Encounter (Signed)
Letter written and sent to administration for mailing.

## 2014-09-01 ENCOUNTER — Telehealth: Payer: Self-pay | Admitting: *Deleted

## 2014-09-01 NOTE — Telephone Encounter (Signed)
Patient called stating he needed medication right now.  He is having a lot of trouble sleeping, not eating and hiccups.  He also is very weak. Patient was seen on Monday, but nothing was prescribed.  Patient denies any other symptoms.  Advised patient that is he needed something right now he should go to the urgent care or ED.  Patient wanted a prescription for antibiotics and Ambien.  Pt told that antibiotics will not be prescribed by a provider based off the symptoms he is having.  As for the Ambien, I will send the request to his PCP.  Pt stated understanding.  Philip Barrow, RN

## 2014-09-02 ENCOUNTER — Other Ambulatory Visit: Payer: Self-pay

## 2014-09-02 ENCOUNTER — Other Ambulatory Visit (HOSPITAL_COMMUNITY): Payer: Self-pay

## 2014-09-02 ENCOUNTER — Emergency Department (HOSPITAL_COMMUNITY): Payer: BC Managed Care – PPO

## 2014-09-02 ENCOUNTER — Encounter (HOSPITAL_COMMUNITY): Payer: Self-pay | Admitting: *Deleted

## 2014-09-02 ENCOUNTER — Inpatient Hospital Stay (HOSPITAL_COMMUNITY)
Admission: EM | Admit: 2014-09-02 | Discharge: 2014-09-08 | DRG: 273 | Disposition: A | Discharge status: Home / Self Care | Payer: BC Managed Care – PPO | Attending: Family Medicine | Admitting: Family Medicine

## 2014-09-02 ENCOUNTER — Ambulatory Visit: Payer: BC Managed Care – PPO | Admitting: Family Medicine

## 2014-09-02 ENCOUNTER — Inpatient Hospital Stay (HOSPITAL_COMMUNITY): Payer: BC Managed Care – PPO

## 2014-09-02 DIAGNOSIS — E860 Dehydration: Secondary | ICD-10-CM | POA: Insufficient documentation

## 2014-09-02 DIAGNOSIS — I5043 Acute on chronic combined systolic (congestive) and diastolic (congestive) heart failure: Principal | ICD-10-CM | POA: Insufficient documentation

## 2014-09-02 DIAGNOSIS — E785 Hyperlipidemia, unspecified: Secondary | ICD-10-CM | POA: Diagnosis present

## 2014-09-02 DIAGNOSIS — Z87891 Personal history of nicotine dependence: Secondary | ICD-10-CM | POA: Diagnosis not present

## 2014-09-02 DIAGNOSIS — N179 Acute kidney failure, unspecified: Secondary | ICD-10-CM | POA: Diagnosis present

## 2014-09-02 DIAGNOSIS — I272 Other secondary pulmonary hypertension: Secondary | ICD-10-CM | POA: Diagnosis present

## 2014-09-02 DIAGNOSIS — K761 Chronic passive congestion of liver: Secondary | ICD-10-CM | POA: Diagnosis present

## 2014-09-02 DIAGNOSIS — N189 Chronic kidney disease, unspecified: Secondary | ICD-10-CM | POA: Diagnosis present

## 2014-09-02 DIAGNOSIS — E780 Pure hypercholesterolemia: Secondary | ICD-10-CM | POA: Diagnosis present

## 2014-09-02 DIAGNOSIS — I4719 Other supraventricular tachycardia: Secondary | ICD-10-CM | POA: Diagnosis present

## 2014-09-02 DIAGNOSIS — Z950 Presence of cardiac pacemaker: Secondary | ICD-10-CM | POA: Diagnosis not present

## 2014-09-02 DIAGNOSIS — W19XXXA Unspecified fall, initial encounter: Secondary | ICD-10-CM | POA: Diagnosis present

## 2014-09-02 DIAGNOSIS — I442 Atrioventricular block, complete: Secondary | ICD-10-CM | POA: Diagnosis present

## 2014-09-02 DIAGNOSIS — Z79899 Other long term (current) drug therapy: Secondary | ICD-10-CM

## 2014-09-02 DIAGNOSIS — I248 Other forms of acute ischemic heart disease: Secondary | ICD-10-CM | POA: Diagnosis present

## 2014-09-02 DIAGNOSIS — R400 Somnolence: Secondary | ICD-10-CM | POA: Diagnosis present

## 2014-09-02 DIAGNOSIS — G4730 Sleep apnea, unspecified: Secondary | ICD-10-CM | POA: Diagnosis present

## 2014-09-02 DIAGNOSIS — Z7982 Long term (current) use of aspirin: Secondary | ICD-10-CM

## 2014-09-02 DIAGNOSIS — Z86718 Personal history of other venous thrombosis and embolism: Secondary | ICD-10-CM | POA: Diagnosis not present

## 2014-09-02 DIAGNOSIS — Z8673 Personal history of transient ischemic attack (TIA), and cerebral infarction without residual deficits: Secondary | ICD-10-CM | POA: Diagnosis not present

## 2014-09-02 DIAGNOSIS — I5023 Acute on chronic systolic (congestive) heart failure: Secondary | ICD-10-CM | POA: Diagnosis not present

## 2014-09-02 DIAGNOSIS — I129 Hypertensive chronic kidney disease with stage 1 through stage 4 chronic kidney disease, or unspecified chronic kidney disease: Secondary | ICD-10-CM | POA: Diagnosis present

## 2014-09-02 DIAGNOSIS — R066 Hiccough: Secondary | ICD-10-CM | POA: Insufficient documentation

## 2014-09-02 DIAGNOSIS — I4892 Unspecified atrial flutter: Secondary | ICD-10-CM | POA: Diagnosis present

## 2014-09-02 DIAGNOSIS — Z6826 Body mass index (BMI) 26.0-26.9, adult: Secondary | ICD-10-CM | POA: Diagnosis not present

## 2014-09-02 DIAGNOSIS — N529 Male erectile dysfunction, unspecified: Secondary | ICD-10-CM | POA: Diagnosis present

## 2014-09-02 DIAGNOSIS — I2781 Cor pulmonale (chronic): Secondary | ICD-10-CM | POA: Diagnosis present

## 2014-09-02 DIAGNOSIS — F419 Anxiety disorder, unspecified: Secondary | ICD-10-CM | POA: Diagnosis present

## 2014-09-02 DIAGNOSIS — R079 Chest pain, unspecified: Secondary | ICD-10-CM | POA: Diagnosis present

## 2014-09-02 DIAGNOSIS — E875 Hyperkalemia: Secondary | ICD-10-CM | POA: Diagnosis not present

## 2014-09-02 DIAGNOSIS — Z7902 Long term (current) use of antithrombotics/antiplatelets: Secondary | ICD-10-CM | POA: Diagnosis not present

## 2014-09-02 DIAGNOSIS — I5042 Chronic combined systolic (congestive) and diastolic (congestive) heart failure: Secondary | ICD-10-CM | POA: Diagnosis not present

## 2014-09-02 DIAGNOSIS — I4581 Long QT syndrome: Secondary | ICD-10-CM | POA: Diagnosis present

## 2014-09-02 DIAGNOSIS — E871 Hypo-osmolality and hyponatremia: Secondary | ICD-10-CM | POA: Diagnosis present

## 2014-09-02 DIAGNOSIS — N433 Hydrocele, unspecified: Secondary | ICD-10-CM | POA: Diagnosis present

## 2014-09-02 DIAGNOSIS — I471 Supraventricular tachycardia: Secondary | ICD-10-CM | POA: Diagnosis present

## 2014-09-02 DIAGNOSIS — Z86711 Personal history of pulmonary embolism: Secondary | ICD-10-CM | POA: Diagnosis present

## 2014-09-02 DIAGNOSIS — I429 Cardiomyopathy, unspecified: Secondary | ICD-10-CM | POA: Diagnosis not present

## 2014-09-02 DIAGNOSIS — I483 Typical atrial flutter: Secondary | ICD-10-CM | POA: Diagnosis not present

## 2014-09-02 DIAGNOSIS — I428 Other cardiomyopathies: Secondary | ICD-10-CM

## 2014-09-02 DIAGNOSIS — Z7901 Long term (current) use of anticoagulants: Secondary | ICD-10-CM

## 2014-09-02 DIAGNOSIS — Z452 Encounter for adjustment and management of vascular access device: Secondary | ICD-10-CM

## 2014-09-02 DIAGNOSIS — G479 Sleep disorder, unspecified: Secondary | ICD-10-CM | POA: Diagnosis present

## 2014-09-02 DIAGNOSIS — J189 Pneumonia, unspecified organism: Secondary | ICD-10-CM | POA: Diagnosis present

## 2014-09-02 DIAGNOSIS — I959 Hypotension, unspecified: Secondary | ICD-10-CM | POA: Diagnosis present

## 2014-09-02 DIAGNOSIS — N50819 Testicular pain, unspecified: Secondary | ICD-10-CM

## 2014-09-02 HISTORY — DX: Chronic combined systolic (congestive) and diastolic (congestive) heart failure: I50.42

## 2014-09-02 HISTORY — DX: Pure hypercholesterolemia, unspecified: E78.00

## 2014-09-02 HISTORY — DX: Other cardiomyopathies: I42.8

## 2014-09-02 HISTORY — DX: Supraventricular tachycardia: I47.1

## 2014-09-02 HISTORY — DX: Other supraventricular tachycardia: I47.19

## 2014-09-02 HISTORY — DX: Presence of cardiac pacemaker: Z95.0

## 2014-09-02 HISTORY — DX: Unspecified atrial flutter: I48.92

## 2014-09-02 HISTORY — DX: Sleep apnea, unspecified: G47.30

## 2014-09-02 HISTORY — DX: Pneumonia, unspecified organism: J18.9

## 2014-09-02 LAB — BASIC METABOLIC PANEL
Anion gap: 12 (ref 5–15)
BUN: 42 mg/dL — ABNORMAL HIGH (ref 6–20)
CO2: 22 mmol/L (ref 22–32)
Calcium: 8.9 mg/dL (ref 8.9–10.3)
Chloride: 92 mmol/L — ABNORMAL LOW (ref 101–111)
Creatinine, Ser: 1.74 mg/dL — ABNORMAL HIGH (ref 0.61–1.24)
GFR calc Af Amer: 46 mL/min — ABNORMAL LOW (ref >60)
GFR calc non Af Amer: 40 mL/min — ABNORMAL LOW (ref >60)
Glucose, Bld: 194 mg/dL — ABNORMAL HIGH (ref 65–99)
POTASSIUM: 5.1 mmol/L (ref 3.5–5.1)
SODIUM: 126 mmol/L — AB (ref 135–145)

## 2014-09-02 LAB — CBC
HCT: 43.2 % (ref 39.0–52.0)
Hemoglobin: 15.1 g/dL (ref 13.0–17.0)
MCH: 32.8 pg (ref 26.0–34.0)
MCHC: 35 g/dL (ref 30.0–36.0)
MCV: 93.7 fL (ref 78.0–100.0)
Platelets: 203 10*3/uL (ref 150–400)
RBC: 4.61 MIL/uL (ref 4.22–5.81)
RDW: 14 % (ref 11.5–15.5)
WBC: 12.3 10*3/uL — AB (ref 4.0–10.5)

## 2014-09-02 LAB — TROPONIN I: TROPONIN I: 0.17 ng/mL — AB (ref <0.031)

## 2014-09-02 MED ORDER — ONDANSETRON HCL 4 MG/2ML IJ SOLN: 4.0000 mg | INTRAMUSCULAR | Status: DC

## 2014-09-02 MED ORDER — CYCLOBENZAPRINE HCL 10 MG PO TABS: 10.0000 mg | ORAL_TABLET | Freq: Once | ORAL | Status: DC

## 2014-09-02 MED ORDER — DEXTROSE 5 % IV SOLN
500.0000 mg | Freq: Once | INTRAVENOUS | Status: AC
  Administered 2014-09-02: 500 mg via INTRAVENOUS
  Filled 2014-09-02: qty 500

## 2014-09-02 MED ORDER — METOCLOPRAMIDE HCL 5 MG/ML IJ SOLN
10.0000 mg | Freq: Once | INTRAMUSCULAR | Status: AC
  Administered 2014-09-02: 10 mg via INTRAVENOUS
  Filled 2014-09-02: qty 2

## 2014-09-02 MED ORDER — LORAZEPAM 2 MG/ML IJ SOLN
1.0000 mg | Freq: Once | INTRAMUSCULAR | Status: DC
  Filled 2014-09-02: qty 1

## 2014-09-02 MED ORDER — SODIUM CHLORIDE 0.9 % IV BOLUS (SEPSIS)
500.0000 mL | Freq: Once | INTRAVENOUS | Status: AC
  Administered 2014-09-02: 500 mL via INTRAVENOUS

## 2014-09-02 MED ORDER — SODIUM CHLORIDE 0.9 % IV BOLUS (SEPSIS)
1000.0000 mL | Freq: Once | INTRAVENOUS | Status: AC
Start: 2014-09-02 — End: 2014-09-02
  Administered 2014-09-02: 1000 mL via INTRAVENOUS

## 2014-09-02 MED ORDER — ASPIRIN 81 MG PO CHEW
CHEWABLE_TABLET | ORAL | Status: AC
  Filled 2014-09-02: qty 4

## 2014-09-02 MED ORDER — FENTANYL CITRATE (PF) 100 MCG/2ML IJ SOLN
50.0000 ug | Freq: Once | INTRAMUSCULAR | Status: AC
  Administered 2014-09-02: 50 ug via INTRAVENOUS
  Filled 2014-09-02: qty 2

## 2014-09-02 MED ORDER — MORPHINE SULFATE 4 MG/ML IJ SOLN
4.0000 mg | Freq: Once | INTRAMUSCULAR | Status: AC
  Administered 2014-09-02: 4 mg via INTRAVENOUS
  Filled 2014-09-02: qty 1

## 2014-09-02 MED ORDER — DEXTROSE 5 % IV SOLN
1.0000 g | Freq: Once | INTRAVENOUS | Status: AC
  Administered 2014-09-02: 1 g via INTRAVENOUS
  Filled 2014-09-02: qty 10

## 2014-09-02 MED ORDER — ASPIRIN 81 MG PO CHEW
324.0000 mg | CHEWABLE_TABLET | Freq: Once | ORAL | Status: AC
  Administered 2014-09-02: 324 mg via ORAL

## 2014-09-02 MED ORDER — GI COCKTAIL ~~LOC~~
30.0000 mL | Freq: Once | ORAL | Status: AC
  Administered 2014-09-02: 30 mL via ORAL
  Filled 2014-09-02: qty 30

## 2014-09-02 MED ORDER — DIPHENHYDRAMINE HCL 50 MG/ML IJ SOLN
25.0000 mg | Freq: Once | INTRAMUSCULAR | Status: AC
  Administered 2014-09-02: 25 mg via INTRAVENOUS
  Filled 2014-09-02: qty 1

## 2014-09-02 MED ORDER — DEXTROSE 5 % IV SOLN
1.0000 g | INTRAVENOUS | Status: DC
  Administered 2014-09-03 – 2014-09-05 (×3): 1 g via INTRAVENOUS
  Filled 2014-09-02 (×4): qty 10

## 2014-09-02 NOTE — ED Provider Notes (Signed)
Medical screening examination/treatment/procedure(s) were conducted as a shared visit with non-physician practitioner(s) and myself.  I personally evaluated the patient during the encounter.   EKG Interpretation None      EKG - ventricular paced rhythm with apparent underlying a flutter, rate 61, LAD.    64 yo male with 3 days of chest pain, nausea/vomiting, decreased appetite, and hiccups.  On exam, well appearing, nontoxic, not distressed, but somnolent (after medications have been administered),Cheyne-Stokes like breathing pattern, heart sounds normal, lungs sounds clear.   CXR shows possible pneumonia.  Wife reports fevers and cough at home as well.  Will treat for CAP.  But labs also show elevated troponin.  Will need admission.    Clinical Impression: 1. Hiccups   2. Chest pain, unspecified chest pain type   3. Dehydration   4. Somnolence   pneumonia    Serita Grit, MD 09/02/14 2355

## 2014-09-02 NOTE — Progress Notes (Signed)
ANTIBIOTIC CONSULT NOTE - INITIAL  Pharmacy Consult for Rocephin Indication: CAP  No Known Allergies  Labs:  Recent Labs  09/02/14 1711  WBC 12.3*  HGB 15.1  PLT 203  CREATININE 1.74*   Estimated Creatinine Clearance: 49.1 mL/min (by C-G formula based on Cr of 1.74).    64yo male c/o hiccups, CP, and "like something is stuck" in esophagus, CXR shows RLL airspace dz c/w PNA, to begin IV ABX.  Will start Rocephin 1g IV Q24H and monitor CBC and Cx.  Wynona Neat, PharmD, BCPS  09/02/2014,11:18 PM

## 2014-09-02 NOTE — ED Notes (Signed)
PA Danise called to bed side. Pt having sudden onset of chest tightness and feeling SOB. Repeat EKG done and 2 L of oxygen placed on pt. Pt is diaphoretic and very anxious.

## 2014-09-02 NOTE — Telephone Encounter (Signed)
I have never examined Mr. Hora and will not prescribe medication at this time. Will forward this message to Dr. Berkley Harvey who he saw in clinic recently.

## 2014-09-02 NOTE — ED Notes (Signed)
MD at bedside. Family practice

## 2014-09-02 NOTE — ED Notes (Signed)
The pt is c/o hiccups  And his heart is CUTTING UP   For the past 3 days.  No pain

## 2014-09-02 NOTE — Telephone Encounter (Signed)
PT came in today for an appointment and is being sent to the ED. Katharina Caper, April D, [place]

## 2014-09-02 NOTE — ED Notes (Signed)
Not eating all day

## 2014-09-02 NOTE — ED Provider Notes (Signed)
CSN: [id number]     Arrival date & time 09/02/14  1640 History   First MD Initiated Contact with Patient 09/02/14 1737     Chief Complaint  Patient presents with  . Hiccups   Philip Chavez is a 64 y.o. male with history of third-degree heart block, pacemaker, DVT, PE, on Xarelto and hypertension who presents to the emergency department complaining of hiccups ongoing for the past 3 days. Patient also reports some slight shortness of breath and nausea intermittently after eating. He reports vomiting food after eating. He reports having some discomfort in his esophagus though reporting that he is able to swallow and has been able to swallow water. He reports he has been awake for the past 3 days due to the hiccups. Patient reports trying Tums and holding his breath to stop his hiccups. He reports they've been constant over the past 3 days. The patient denies history of previous hiccups. He denies changes to his medications. He denies chest pain.  The patient denies fevers, chills, urinary symptoms, abdominal pain, new medications, drooling, current nausea, chest pain, palpitations, or rashes.  (Consider location/radiation/quality/duration/timing/severity/associated sxs/prior Treatment) HPI  Past Medical History  Diagnosis Date  . DVT (deep venous thrombosis) 2007; 03/2013    RLE: S/P foot OR; LLE  . Embolism, pulmonary with infarction 2007; 03/2013    S/P foot OR; bilateral  . Complete AV block     s/p MDT pacemaker  . Third degree heart block   . Hypercoagulable state   . Musculoskeletal neck pain   . Hypertension    Past Surgical History  Procedure Laterality Date  . Hernia repair  1980's    "in my stomach"  . Foot fracture surgery Right 2007  . Pacemaker insertion  03/16/2013    MDT ADDRL1 pacemaker implanted by Dr Lovena Le for complete heart block  . Permanent pacemaker insertion N/A 03/15/2013    Procedure: PERMANENT PACEMAKER INSERTION;  Surgeon: Evans Lance, MD;  Location: [facility] CATH  LAB;  Service: Cardiovascular;  Laterality: N/A;   Family History  Problem Relation Age of Onset  . Diabetes type II Mother   . Heart disease Father    History  Substance Use Topics  . Smoking status: Former Smoker -- 1.50 packs/day for 10 years    Types: Cigarettes  . Smokeless tobacco: Never Used     Comment: 03/16/2013 "quit smoking 20-30 yr ago"  . Alcohol Use: No    Review of Systems  Constitutional: Negative for fever and chills.  HENT: Negative for congestion, sore throat and trouble swallowing.   Eyes: Negative for visual disturbance.  Respiratory: Negative for cough, shortness of breath and wheezing.   Cardiovascular: Negative for chest pain and palpitations.  Gastrointestinal: Positive for vomiting. Negative for nausea, abdominal pain and diarrhea.       Hiccups.   Genitourinary: Negative for dysuria and difficulty urinating.  Musculoskeletal: Negative for back pain and neck pain.  Skin: Negative for rash.  Neurological: Negative for syncope, weakness, light-headedness and headaches.      Allergies  Review of patient's allergies indicates no known allergies.  Home Medications   Prior to Admission medications   Medication Sig Start Date End Date Taking? Authorizing Provider  aspirin 81 MG EC tablet Take 1 tablet (81 mg total) by mouth daily. 08/26/13   Coral Spikes, DO  furosemide (LASIX) 40 MG tablet Take 1 tablet (40 mg total) by mouth daily. 04/25/14   Coral Spikes, DO  lisinopril (PRINIVIL,ZESTRIL) 5  MG tablet Take 1 tablet (5 mg total) by mouth daily. 08/26/13   Coral Spikes, DO  metoprolol (LOPRESSOR) 50 MG tablet Take 0.5 tablets (25 mg total) by mouth 2 (two) times daily. 08/26/13   Coral Spikes, DO  rivaroxaban (XARELTO) 20 MG TABS tablet Take 1 tablet (20 mg total) by mouth daily with supper. 08/09/14   [place], DO  sildenafil (VIAGRA) 100 MG tablet Take 1 tablet (100 mg total) by mouth as needed for erectile dysfunction. 04/28/14   Evans Lance, MD   simvastatin (ZOCOR) 20 MG tablet Take 1 tablet (20 mg total) by mouth daily at 6 PM. 04/28/14   Evans Lance, MD  spironolactone (ALDACTONE) 25 MG tablet Take 0.5 tablets (12.5 mg total) by mouth daily. 08/26/13   Jayce G Cook, DO   BP 120/77 mmHg  Pulse 59  Temp(Src) 97.6 F (36.4 C) (Oral)  Resp 25  Wt 192 lb 12.8 oz (87.454 kg)  SpO2 99% Physical Exam  Constitutional: He is oriented to person, place, and time. He appears well-developed and well-nourished. No distress.  Nontoxic appearing. Patient is hiccupping every 10-15 seconds or so.   HENT:  Head: Normocephalic and atraumatic.  Mouth/Throat: Oropharynx is clear and moist.  No drooling or spitting. Handling sections well. No posterior oropharyngeal erythema or edema.   Eyes: Conjunctivae are normal. Pupils are equal, round, and reactive to light. Right eye exhibits no discharge. Left eye exhibits no discharge.  Neck: Neck supple.  Cardiovascular: Normal rate, regular rhythm, normal heart sounds and intact distal pulses.  Exam reveals no gallop and no friction rub.   No murmur heard. Bilateral radial and posterior tibialis pulses are intact.  Pulmonary/Chest: Effort normal and breath sounds normal. No respiratory distress. He has no wheezes. He has no rales. He exhibits no tenderness.  Lungs are clear to auscultation bilaterally. No chest wall tenderness.  Abdominal: Soft. Bowel sounds are normal. He exhibits no distension. There is no tenderness. There is no guarding.  Abdomen is soft and nontender to palpation.  Musculoskeletal: He exhibits edema.  Mild bilateral ankle edema.  Lymphadenopathy:    He has no cervical adenopathy.  Neurological: He is alert and oriented to person, place, and time. Coordination normal.  Skin: Skin is warm and dry. No rash noted. He is not diaphoretic. No erythema. No pallor.  Psychiatric: He has a normal mood and affect. His behavior is normal.  Nursing note and vitals reviewed.   ED Course   Procedures (including critical care time) Labs Review Labs Reviewed  BASIC METABOLIC PANEL - Abnormal; Notable for the following:    Sodium 126 (*)    Chloride 92 (*)    Glucose, Bld 194 (*)    BUN 42 (*)    Creatinine, Ser 1.74 (*)    GFR calc non Af Amer 40 (*)    GFR calc Af Amer 46 (*)    All other components within normal limits  CBC - Abnormal; Notable for the following:    WBC 12.3 (*)    All other components within normal limits  TROPONIN I - Abnormal; Notable for the following:    Troponin I 0.17 (*)    All other components within normal limits  CULTURE, BLOOD (ROUTINE X 2)  CULTURE, BLOOD (ROUTINE X 2)  MRSA PCR SCREENING  LIPASE, BLOOD  HEPATIC FUNCTION PANEL  BRAIN NATRIURETIC PEPTIDE    Imaging Review Dg Chest 2 View  09/02/2014   CLINICAL DATA:  Arrhythmia and hiccups for 3 days.  EXAM: CHEST  2 VIEW  COMPARISON:  04/12/2014 and prior radiographs  FINDINGS: Cardiomegaly and left sided pacemaker again noted.  Right lower lobe airspace disease is compatible with pneumonia.  There is no evidence of pulmonary edema, suspicious pulmonary nodule/mass, pleural effusion, or pneumothorax. No acute bony abnormalities are identified.  IMPRESSION: Right lower lobe airspace disease compatible with pneumonia. Radiographic follow-up to resolution is recommended.  Cardiomegaly.   Electronically Signed   By: Margarette Canada M.D.   On: 09/02/2014 18:54   Ct Head Wo Contrast  09/02/2014   CLINICAL DATA:  Somnolence  EXAM: CT HEAD WITHOUT CONTRAST  TECHNIQUE: Contiguous axial images were obtained from the base of the skull through the vertex without intravenous contrast.  COMPARISON:  None.  FINDINGS: There is encephalomalacia due to remote infarction in the right caudate and anterior basal ganglia. There is no intracranial hemorrhage, mass or evidence of acute infarction. Gray-white differentiation is preserved. There is mild generalized atrophy. There is no bony abnormality. Visible  paranasal sinuses are clear.  IMPRESSION: Remote right caudate and basal ganglia infarction. Mild generalized atrophy. No acute findings.   Electronically Signed   By: Andreas Newport M.D.   On: 09/02/2014 23:27     EKG Interpretation None      Filed Vitals:   09/02/14 1655 09/02/14 1745  BP: 111/73 120/77  Pulse: 80 59  Temp: 97.6 F (36.4 C)   TempSrc: Oral   Resp: 22 25  Weight: 192 lb 12.8 oz (87.454 kg)   SpO2: 99% 99%     MDM   Filed Vitals:   09/02/14 2215 09/02/14 2230 09/02/14 2245 09/02/14 2346  BP: 104/71 107/73 116/71 103/45  Pulse: 66 58 61 62  Temp:    98.3 F (36.8 C)  TempSrc:    Oral  Resp: 22 25 20 21   Weight:    200 lb 9.9 oz (91 kg)  SpO2: 100% 95% 93% 96%    Final diagnoses:  Hiccups  Chest pain, unspecified chest pain type  Dehydration   This  is a 64 y.o. male with history of third-degree heart block, pacemaker, DVT, PE, on Xarelto and hypertension who presents to the emergency department complaining of hiccups ongoing for the past 3 days. Patient also reports some slight shortness of breath and nausea intermittently after eating. He reports vomiting food after eating. He reports having some discomfort in his esophagus though reporting that he is able to swallow and has been able to swallow water. He reports he has been awake for the past 3 days due to the hiccups.  He denies having chest pain.  On exam patient is afebrile and nontoxic appearing. The patient is having hiccups that occur partially every 10-15 seconds. The patient is not drooling is handling his secretions easily. Patient is able to speak in complete sentences. Lungs are clear to auscultation bilaterally. EKG shows A flutter and is v-paced. Blood work was ordered and patient was given Reglan 10 mg and Benadryl for his hiccups. Patient's BMP indicated a sodium of 126 with creatinine of 1.74 and a BUN of 42. Suspect the patient is dehydrated from has not been over drink much water with his  hiccups. CBC indicates a mild leukocytosis with a white count of 12.3. Troponin is 0.17. The patient still denies chest pain. Chest x-ray indicates a right lower lobe pneumonia. Patient started on azithromycin in the emergency department. At reevaluation the patient's hiccups have resolved, he  appears to be having some periods of apnea and hypoxia while sleeping. Patient placed on oxygen and I will consult for admission. Patient consulted for admission with Dr. Berkley Harvey who accepted the patient for admission and reports he'll be by to see him shortly. While awaiting for family medicine to evaluate patient the patient's nurse reported that he woke up and is complaining of chest pain. Repeat EKG was ordered and I went to bedside. The patient appears anxious and is having intermittent waves of chest pain. He is requesting pain medication. He reports substernal chest pain. Pain medication given and attending Dr. Doy Mince at bedside. I consulted with Cardiologist Dr. Eula Fried who would like for family medicine to contact him as needed after his BNP comes back. BNP still pending. Lab reports this is in progress. Family medicine to admit for continued work up.   This patient was discussed with and evaluated by Dr. Doy Mince who agrees with assessment and plan.   Waynetta Pean, PA-C 09/03/14 0028  Serita Grit, MD 09/03/14 [phone]

## 2014-09-02 NOTE — H&P (Signed)
[facility] Admission History and Physical Service Pager: [phone]  Patient name: Philip Chavez Medical record number: [id number] Date of birth: 05/28/1950 Age: 64 y.o. Gender: male  Primary Care Provider: Junie Panning, DO Consultants: cardiology Code Status: FULL  Chief Complaint: chest pain  Assessment and Plan: Philip Chavez is a 64 y.o. male presenting with chest pain. 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    Chest pain: Began today. Intermittent.  Midsternal and does not radiate, pain non-reproducible. Worsened by "thoughts." EKG in ED showed sinus rhythm with prolonged QT. - admit to stepdown; tele - Cards called by ED - no recommendations for now, but suggested consulting again if change in status or increased subsequent troponins  - Repeat EKG in AM - Mild elevation of first troponin at 0.17. Continue to cycle - LA pending  - Echo in AM - check UDS  Pneumonia, CAP: Received one dose of CTX and azithro in ED. Endorsed some SOB before presentation. CXR showed R lower lobe PNA with stable cardiomegaly. WBC slightly increased at 12.3.  - Treat for CAP with CTX per pharm, azithro, and prednisone 40 mg given CHF  - Blood cultures pending  - check Strep and Legionella urine antigen  Unwitnessed fall: patient's wife reports the patient fell yesterday but she did not witness the fall and does not know if the patient hit his head - Given that patient is on Xarelto, obtained CT head - CT showed remote R caudate and basal ganglia infarct but no acute findings   CHF: EF 20% on echo in 07/2013. Takes Lasix 40 mg, lisinopril, lopressor, simvastatin, and spironolactone at home.  - Hold home meds  - No continuous IVF for now  - BNP pending - Echo in AM - monitor I&Os  AKI: Cr 1.74 on admission - 500 cc NS bolus - Monitor  - check UA  Hiccups: given Benadryl and Reglan in ED - Resolved  HTN:  home lisinopril, lopressor, spironolactone.  - Pt relatively hypotensive with systolics in low 588F in ED - Hold home meds for now; restart when able  History DVT/PE: on Xarelto at home - Continue home Xarelto  Hyponatremia: potentially secondary to patient's self-reported dehydration. Na 126 on admission. Does not appear volume overloaded on CXR - Monitor; rehydrate with 500cc bolus - BMP in AM  FEN/GI: KVO; heart healthy diet, NPO at 4 AM Prophylaxis: continue home Xarelto  Disposition: admit to SDU  History of Present Illness: Philip Chavez is a 64 y.o. male presenting with chest pain.  Philip Chavez presented initially for three days of hiccups, but endorsed chest pain upon arrival to ED. He describes the pain as 7/10 pressure in midsternal region that does not radiate. He said the pain is intermittent, and lasts for about 10 minutes before resolving spontaneously. He said the pain is worsened by his "thoughts." He also said deep breathing and eating increase his pain. He denied any alleviating factors.   He has also been vomiting for the past week. He says he cannot tolerate solids or liquids. He has also had diarrhea, with 3 bowel movements reported in the last 24 hours. He denies hematemesis or melena.  Of note, patient had an unwitnessed fall last night. His wife does not know if he hit his head or not, but says she feels he has been confused and weak recently. He is taking Xarelto for history of DVT and PE.  Review Of Systems: Per HPI with the following additions: endorses sweats, subjective fever, diarrhea; denies orthopnea Otherwise 12 point review of systems was performed and was unremarkable.  Patient Active Problem List   Diagnosis Date Noted  . Chest pain 09/02/2014  . Dehydration   . Somnolence   . Sleeping difficulty 08/29/2014  . Anxiety 08/29/2014  . Atrial fibrillation 04/28/2014  . Dyslipidemia 04/28/2014  . Chronic combined systolic and diastolic CHF (congestive  heart failure) 04/25/2014  . CAP (community acquired pneumonia) 04/12/2014  . Erectile dysfunction 09/21/2013  . Atrial tachycardia 08/09/2013  . Allergic rhinitis 07/07/2013  . Pacemaker 06/17/2013  . Preventative health care 04/13/2013  . Hx of pulmonary embolus 03/12/2013  . Third degree heart block 03/12/2013  . History of DVT (deep vein thrombosis) 03/11/2013   Past Medical History: Past Medical History  Diagnosis Date  . DVT (deep venous thrombosis) 2007; 03/2013    RLE: S/P foot OR; LLE  . Embolism, pulmonary with infarction 2007; 03/2013    S/P foot OR; bilateral  . Complete AV block     s/p MDT pacemaker  . Third degree heart block   . Hypercoagulable state   . Musculoskeletal neck pain   . Hypertension   . Presence of permanent cardiac pacemaker   . Hypercholesterolemia   . CHF (congestive heart failure)   . Pneumonia ~ 04/2014  . Sleep apnea     does not wear mask (09/02/2014)   Past Surgical History: Past Surgical History  Procedure Laterality Date  . Foot fracture surgery Right 2007  . Permanent pacemaker insertion N/A 03/15/2013    Procedure: PERMANENT PACEMAKER INSERTION;  Surgeon: Evans Lance, MD;  Location: [facility] CATH LAB;  Service: Cardiovascular;  Laterality: N/A;  . Inguinal hernia repair Right 1980's  . Insert / replace / remove pacemaker      MDT ADDRL1 pacemaker implanted by Dr Lovena Le for complete heart block  . Fracture surgery     Social History: History  Substance Use Topics  . Smoking status: Former Smoker -- 1.50 packs/day for 10 years    Types: Cigarettes  . Smokeless tobacco: Never Used     Comment: 09/02/2014  "quit smoking 20-30 yr ago"  . Alcohol Use: No   Additional social history: history of drug use 30 years ago Please also refer to relevant sections of EMR.  Family History: Family History  Problem Relation Age of Onset  . Diabetes type II Mother   . Heart disease Father    Allergies and Medications: No Known Allergies No  current facility-administered medications on file prior to encounter.   Current Outpatient Prescriptions on File Prior to Encounter  Medication Sig Dispense Refill  . aspirin 81 MG EC tablet Take 1 tablet (81 mg total) by mouth daily. 90 tablet 3  . furosemide (LASIX) 40 MG tablet Take 1 tablet (40 mg total) by mouth daily. 180 tablet 3  . lisinopril (PRINIVIL,ZESTRIL) 5 MG tablet Take 1 tablet (5 mg total) by mouth daily. 90 tablet 3  . metoprolol (LOPRESSOR) 50 MG tablet Take 0.5 tablets (25 mg total) by mouth 2 (two) times daily. 90 tablet 3  . rivaroxaban (XARELTO) 20 MG TABS tablet Take 1 tablet (20 mg total) by mouth daily with supper. 30 tablet 6  . sildenafil (VIAGRA) 100 MG tablet Take 1 tablet (100 mg total) by mouth as needed for erectile dysfunction. 10 tablet 1  . simvastatin (ZOCOR) 20 MG tablet Take 1 tablet (20 mg total) by  mouth daily at 6 PM. 90 tablet 3  . spironolactone (ALDACTONE) 25 MG tablet Take 0.5 tablets (12.5 mg total) by mouth daily. 90 tablet 3    Objective: BP 103/45 mmHg  Pulse 62  Temp(Src) 98.3 F (36.8 C) (Oral)  Resp 21  Wt 200 lb 9.9 oz (91 kg)  SpO2 96% Exam: General: somnolent but easy to arouse gentleman lying in bed Eyes: PERRLA ENTM: MMM Cardiovascular: intermittent tachycardia; 1+ LE pitting edema bialterally Respiratory: intermittently tachypnea up to RR 51; no wheezes or crackles noted Abdomen: soft, non-tender, non-distended, +BS MSK: 5/5 strength upper extremities, no TTP on chest Skin: no rashes or bruises noted Neuro: somnolent but A&Ox3, no focal deficits Psych: appropriate mood and affect  Labs and Imaging: CBC BMET   Recent Labs Lab 09/02/14 1711  WBC 12.3*  HGB 15.1  HCT 43.2  PLT 203    Recent Labs Lab 09/02/14 1711  NA 126*  K 5.1  CL 92*  CO2 22  BUN 42*  CREATININE 1.74*  GLUCOSE 194*  CALCIUM 8.9     Dg Chest 2 View  09/02/2014   CLINICAL DATA:  Arrhythmia and hiccups for 3 days.  EXAM: CHEST  2 VIEW   COMPARISON:  04/12/2014 and prior radiographs  FINDINGS: Cardiomegaly and left sided pacemaker again noted.  Right lower lobe airspace disease is compatible with pneumonia.  There is no evidence of pulmonary edema, suspicious pulmonary nodule/mass, pleural effusion, or pneumothorax. No acute bony abnormalities are identified.  IMPRESSION: Right lower lobe airspace disease compatible with pneumonia. Radiographic follow-up to resolution is recommended.  Cardiomegaly.   Electronically Signed   By: Margarette Canada M.D.   On: 09/02/2014 18:54   Ct Head Wo Contrast  09/02/2014   CLINICAL DATA:  Somnolence  EXAM: CT HEAD WITHOUT CONTRAST  TECHNIQUE: Contiguous axial images were obtained from the base of the skull through the vertex without intravenous contrast.  COMPARISON:  None.  FINDINGS: There is encephalomalacia due to remote infarction in the right caudate and anterior basal ganglia. There is no intracranial hemorrhage, mass or evidence of acute infarction. Gray-white differentiation is preserved. There is mild generalized atrophy. There is no bony abnormality. Visible paranasal sinuses are clear.  IMPRESSION: Remote right caudate and basal ganglia infarction. Mild generalized atrophy. No acute findings.   Electronically Signed   By: Andreas Newport M.D.   On: 09/02/2014 23:27     Verner Mould, MD 09/03/2014, 12:07 AM PGY-1, [place] [facility] pager: [phone], text pages welcome  I have read and agree with the amended note as above.  Phill Myron, MD, PGY-2 12:25 AM

## 2014-09-02 NOTE — ED Notes (Signed)
MD at bedside. [place].

## 2014-09-02 NOTE — ED Notes (Signed)
The pt feels like something is caught in his esophagus

## 2014-09-03 ENCOUNTER — Encounter (HOSPITAL_COMMUNITY): Payer: Self-pay | Admitting: General Practice

## 2014-09-03 ENCOUNTER — Inpatient Hospital Stay (HOSPITAL_COMMUNITY): Payer: BC Managed Care – PPO

## 2014-09-03 DIAGNOSIS — R079 Chest pain, unspecified: Secondary | ICD-10-CM

## 2014-09-03 DIAGNOSIS — I5023 Acute on chronic systolic (congestive) heart failure: Secondary | ICD-10-CM

## 2014-09-03 DIAGNOSIS — Z950 Presence of cardiac pacemaker: Secondary | ICD-10-CM

## 2014-09-03 DIAGNOSIS — R748 Abnormal levels of other serum enzymes: Secondary | ICD-10-CM

## 2014-09-03 DIAGNOSIS — N183 Chronic kidney disease, stage 3 (moderate): Secondary | ICD-10-CM

## 2014-09-03 DIAGNOSIS — I4892 Unspecified atrial flutter: Secondary | ICD-10-CM

## 2014-09-03 DIAGNOSIS — I420 Dilated cardiomyopathy: Secondary | ICD-10-CM

## 2014-09-03 DIAGNOSIS — N179 Acute kidney failure, unspecified: Secondary | ICD-10-CM

## 2014-09-03 DIAGNOSIS — I270 Primary pulmonary hypertension: Secondary | ICD-10-CM

## 2014-09-03 LAB — BASIC METABOLIC PANEL
ANION GAP: 10 (ref 5–15)
BUN: 37 mg/dL — AB (ref 6–20)
CHLORIDE: 93 mmol/L — AB (ref 101–111)
CO2: 26 mmol/L (ref 22–32)
CREATININE: 1.6 mg/dL — AB (ref 0.61–1.24)
Calcium: 8.9 mg/dL (ref 8.9–10.3)
GFR calc non Af Amer: 44 mL/min — ABNORMAL LOW (ref >60)
GFR, EST AFRICAN AMERICAN: 51 mL/min — AB (ref >60)
Glucose, Bld: 209 mg/dL — ABNORMAL HIGH (ref 65–99)
Potassium: 5.6 mmol/L — ABNORMAL HIGH (ref 3.5–5.1)
Sodium: 129 mmol/L — ABNORMAL LOW (ref 135–145)

## 2014-09-03 LAB — URINE MICROSCOPIC-ADD ON

## 2014-09-03 LAB — HEPATIC FUNCTION PANEL
ALT: 216 U/L — ABNORMAL HIGH (ref 17–63)
ALT: 172 U/L — AB (ref 17–63)
AST: 127 U/L — AB (ref 15–41)
AST: 124 U/L — AB (ref 15–41)
Albumin: 2.8 g/dL — ABNORMAL LOW (ref 3.5–5.0)
Albumin: 3.2 g/dL — ABNORMAL LOW (ref 3.5–5.0)
Alkaline Phosphatase: 92 U/L (ref 38–126)
Alkaline Phosphatase: 98 U/L (ref 38–126)
BILIRUBIN DIRECT: 0.8 mg/dL — AB (ref 0.1–0.5)
BILIRUBIN INDIRECT: 1.4 mg/dL — AB (ref 0.3–0.9)
BILIRUBIN TOTAL: 2.2 mg/dL — AB (ref 0.3–1.2)
Bilirubin, Direct: 0.8 mg/dL — ABNORMAL HIGH (ref 0.1–0.5)
Indirect Bilirubin: 1.7 mg/dL — ABNORMAL HIGH (ref 0.3–0.9)
TOTAL PROTEIN: 6.6 g/dL (ref 6.5–8.1)
Total Bilirubin: 2.5 mg/dL — ABNORMAL HIGH (ref 0.3–1.2)
Total Protein: 7.2 g/dL (ref 6.5–8.1)

## 2014-09-03 LAB — URINALYSIS, ROUTINE W REFLEX MICROSCOPIC
BILIRUBIN URINE: NEGATIVE
Glucose, UA: NEGATIVE mg/dL
Ketones, ur: NEGATIVE mg/dL
LEUKOCYTES UA: NEGATIVE
Nitrite: NEGATIVE
PH: 5.5 (ref 5.0–8.0)
Protein, ur: 100 mg/dL — AB
Specific Gravity, Urine: 1.024 (ref 1.005–1.030)
Urobilinogen, UA: 1 mg/dL (ref 0.0–1.0)

## 2014-09-03 LAB — CBC
HEMATOCRIT: 46 % (ref 39.0–52.0)
Hemoglobin: 15.9 g/dL (ref 13.0–17.0)
MCH: 33.3 pg (ref 26.0–34.0)
MCHC: 34.6 g/dL (ref 30.0–36.0)
MCV: 96.2 fL (ref 78.0–100.0)
PLATELETS: 196 10*3/uL (ref 150–400)
RBC: 4.78 MIL/uL (ref 4.22–5.81)
RDW: 14.2 % (ref 11.5–15.5)
WBC: 12.2 10*3/uL — ABNORMAL HIGH (ref 4.0–10.5)

## 2014-09-03 LAB — MRSA PCR SCREENING: MRSA by PCR: NEGATIVE

## 2014-09-03 LAB — TROPONIN I
TROPONIN I: 0.1 ng/mL — AB (ref <0.031)
Troponin I: 0.08 ng/mL — ABNORMAL HIGH (ref <0.031)

## 2014-09-03 LAB — RAPID URINE DRUG SCREEN, HOSP PERFORMED
AMPHETAMINES: NOT DETECTED
BENZODIAZEPINES: NOT DETECTED
Barbiturates: NOT DETECTED
Cocaine: NOT DETECTED
OPIATES: POSITIVE — AB
Tetrahydrocannabinol: NOT DETECTED

## 2014-09-03 LAB — LIPID PANEL
CHOL/HDL RATIO: 4.8 ratio
CHOLESTEROL: 101 mg/dL (ref 0–200)
HDL: 21 mg/dL — AB (ref >40)
LDL Cholesterol: 67 mg/dL (ref 0–99)
Triglycerides: 65 mg/dL (ref <150)
VLDL: 13 mg/dL (ref 0–40)

## 2014-09-03 LAB — LIPASE, BLOOD: LIPASE: 16 U/L — AB (ref 22–51)

## 2014-09-03 LAB — STREP PNEUMONIAE URINARY ANTIGEN: Strep Pneumo Urinary Antigen: NEGATIVE

## 2014-09-03 LAB — BRAIN NATRIURETIC PEPTIDE: B Natriuretic Peptide: 1598.8 pg/mL — ABNORMAL HIGH (ref 0.0–100.0)

## 2014-09-03 MED ORDER — AMIODARONE HCL IN DEXTROSE 360-4.14 MG/200ML-% IV SOLN
30.0000 mg/h | INTRAVENOUS | Status: DC
  Administered 2014-09-04 – 2014-09-06 (×4): 30 mg/h via INTRAVENOUS
  Filled 2014-09-03 (×6): qty 200

## 2014-09-03 MED ORDER — PREDNISONE 20 MG PO TABS
40.0000 mg | ORAL_TABLET | Freq: Every day | ORAL | Status: DC
  Administered 2014-09-03 – 2014-09-05 (×3): 40 mg via ORAL
  Filled 2014-09-03 (×3): qty 2

## 2014-09-03 MED ORDER — AMIODARONE LOAD VIA INFUSION
150.0000 mg | Freq: Once | INTRAVENOUS | Status: AC
  Administered 2014-09-03: 150 mg via INTRAVENOUS
  Filled 2014-09-03: qty 83.34

## 2014-09-03 MED ORDER — AZITHROMYCIN 250 MG PO TABS
250.0000 mg | ORAL_TABLET | Freq: Every day | ORAL | Status: DC
  Administered 2014-09-03: 250 mg via ORAL
  Filled 2014-09-03: qty 1

## 2014-09-03 MED ORDER — ASPIRIN 81 MG PO TBEC: 81.0000 mg | DELAYED_RELEASE_TABLET | Freq: Every day | ORAL | Status: DC

## 2014-09-03 MED ORDER — FUROSEMIDE 10 MG/ML IJ SOLN
40.0000 mg | Freq: Two times a day (BID) | INTRAMUSCULAR | Status: DC
  Administered 2014-09-04: 40 mg via INTRAVENOUS
  Filled 2014-09-03: qty 4

## 2014-09-03 MED ORDER — SODIUM CHLORIDE 0.9 % IV SOLN: INTRAVENOUS | Status: DC

## 2014-09-03 MED ORDER — METOPROLOL TARTRATE 50 MG PO TABS
50.0000 mg | ORAL_TABLET | Freq: Two times a day (BID) | ORAL | Status: DC
  Administered 2014-09-03 – 2014-09-04 (×4): 50 mg via ORAL
  Filled 2014-09-03 (×4): qty 1

## 2014-09-03 MED ORDER — AMIODARONE HCL IN DEXTROSE 360-4.14 MG/200ML-% IV SOLN
60.0000 mg/h | INTRAVENOUS | Status: AC
  Administered 2014-09-03: 60 mg/h via INTRAVENOUS
  Filled 2014-09-03: qty 200

## 2014-09-03 MED ORDER — ASPIRIN EC 81 MG PO TBEC
81.0000 mg | DELAYED_RELEASE_TABLET | Freq: Every day | ORAL | Status: DC
  Administered 2014-09-03 – 2014-09-04 (×2): 81 mg via ORAL
  Filled 2014-09-03 (×2): qty 1

## 2014-09-03 MED ORDER — RIVAROXABAN 20 MG PO TABS
20.0000 mg | ORAL_TABLET | Freq: Every day | ORAL | Status: DC
  Administered 2014-09-03 – 2014-09-07 (×5): 20 mg via ORAL
  Filled 2014-09-03 (×5): qty 1

## 2014-09-03 MED ORDER — ACETAMINOPHEN 325 MG PO TABS
650.0000 mg | ORAL_TABLET | ORAL | Status: DC | PRN
  Administered 2014-09-03: 650 mg via ORAL
  Filled 2014-09-03: qty 2

## 2014-09-03 MED ORDER — ASPIRIN 81 MG PO CHEW
324.0000 mg | CHEWABLE_TABLET | Freq: Once | ORAL | Status: DC
  Filled 2014-09-03: qty 4

## 2014-09-03 MED ORDER — PERFLUTREN LIPID MICROSPHERE
1.0000 mL | INTRAVENOUS | Status: AC | PRN
  Administered 2014-09-03: 2 mL via INTRAVENOUS
  Filled 2014-09-03: qty 10

## 2014-09-03 NOTE — Progress Notes (Signed)
Patient has hiccups again. MD notified and made aware. Patient is only having brief 1-2 hiccups every couple minutes at this time. Resting quietly. [facility] [employer]

## 2014-09-03 NOTE — Progress Notes (Signed)
FPTS Interim Progress Note  S:chest pain resolved. No SOB or cough. Endorsing hiccups approximately every 5 minutes.   O: BP 116/75 mmHg  Pulse 99  Temp(Src) 98.6 F (37 C) (Oral)  Resp 17  Ht 6\' 1"  (1.854 m)  Wt 200 lb 9.9 oz (91 kg)  BMI 26.47 kg/m2  SpO2 99%  General: sitting up in bed in NAD. Non-toxic Eyes: Conjunctivae non-injected.  ENTM: Moist mucous membranes. Oropharynx clear. No nasal discharge.  Neck: Supple, no LAD Cardiovascular: RRR. No murmurs, rubs, or gallops noted. No pitting edema noted. Respiratory: No increased WOB. CTAB without wheezing, rhonchi, or crackles noted. Abdomen: +BS, soft, non-distended, non-tender.    A/P: Philip Chavez is a 64 y.o. male presenting with chest pain. 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  Chest pain: Began on day of admission and resolved. Was intermittent, midsternal without radiation and worsened by "thoughts." EKG in ED showed sinus rhythm with prolonged QT. Trop 0.17 >0.1 >0.08. EKG this AM with atrial flutter  - continue to monitor on telemetry - Cards called by ED - no recommendations at that time, but suggested consulting again if change in status or increased subsequent troponins  - consider contacting cardiology concerning use of azithromycin with QTc and pacemaker.  - f/u echo- if stable or worsened, call cards  Pneumonia, CAP: Received one dose of CTX and azithro in ED. Endorsed some SOB before presentation. CXR showed R lower lobe PNA with stable cardiomegaly. WBC slightly increased at 12.3 on admission without a repeat done. Negative strep pneumo.  - Treat for CAP with CTX per pharm, azithro, and prednisone 40 mg  - Blood cultures pending  - check f/u Legionella urine antigen  Unwitnessed fall: patient's wife reports the patient fell the day PTA but she did not witness the fall and does not know if the patient hit his head - Given that patient is  on Xarelto, obtained CT head - CT showed remote R caudate and basal ganglia infarct but no acute findings  - consider stroke work up in the outpatient setting.   CHF: EF 20% on echo in 07/2013. Takes Lasix 40 mg, lisinopril, lopressor, simvastatin, and spironolactone at home.  - Hold home meds  - will re-start home metop. Holding off on lisinopril, will watch BP.  - Echo ordered - monitor I&Os  AKI: Cr 1.74 on admission, no repeat done - s/p 500 cc NS bolus - ordered BMET  Hiccups: given Benadryl and Reglan in ED - Improving, continue to monitor  HTN: home lisinopril, lopressor, spironolactone.  - Pt relatively hypotensive with systolics in low 756E in ED - Hold home spiro and lisinopril   Transaminitis: New on this admission. - viral hepatitis panel - f/u hepatic function.   History DVT/PE: on Xarelto at home - Continue home Xarelto  Hyponatremia: potentially secondary to patient's self-reported dehydration. Na 126 on admission. Does not appear volume overloaded on CXR - Monitor; rehydrate with 500cc bolus - BMP in AM  FEN/GI: IV saline lock; heart healthy diet Prophylaxis: continue home Xarelto  Archie Patten, MD 09/03/2014, 7:12 AM PGY-2, [facility] pager [phone]

## 2014-09-03 NOTE — Progress Notes (Signed)
Pt attempted to pass tool today but was unable to. Pt had fresh blood noted on the bed pan.

## 2014-09-03 NOTE — Consult Note (Addendum)
Patient ID: Philip Chavez MRN: [id number], DOB/AGE: May 19, 1950   Admit date: 09/02/2014  Primary Physician: Junie Panning, DO Primary Cardiologist: Dr. Lovena Le   Pt. Profile:  64 y/o male with h/o CHB s/p PPM in 2015, also with presumed nonischemic cardiomyopathy admitted for chest pain and found to have worsening LV dysfunction and newly diagnosed atrial flutter.   Problem List  Past Medical History  Diagnosis Date  . DVT (deep venous thrombosis) 2007; 03/2013    RLE: S/P foot OR; LLE  . Embolism, pulmonary with infarction 2007; 03/2013    S/P foot OR; bilateral  . Complete AV block     s/p MDT pacemaker  . Third degree heart block   . Hypercoagulable state   . Musculoskeletal neck pain   . Hypertension   . Presence of permanent cardiac pacemaker   . Hypercholesterolemia   . CHF (congestive heart failure)   . Sleep apnea     does not wear mask (09/02/2014)  . Pneumonia ~ 04/2014; 09/02/2014    Past Surgical History  Procedure Laterality Date  . Foot fracture surgery Right 2007  . Permanent pacemaker insertion N/A 03/15/2013    Procedure: PERMANENT PACEMAKER INSERTION;  Surgeon: Evans Lance, MD;  Location: [facility] CATH LAB;  Service: Cardiovascular;  Laterality: N/A;  . Inguinal hernia repair Right 1980's  . Insert / replace / remove pacemaker      MDT ADDRL1 pacemaker implanted by Dr Lovena Le for complete heart block  . Fracture surgery      Allergies  No Known Allergies  HPI  64 yo male, followed by Dr. Lovena Le,  with a h/o complete heart block and HTN, s/p PPM insertion 03/2013. 4 months later in 07/2013, he was diagnosed with new systolic HF/ cardiomyopathy. EF on echo was 20-25%. His EF prior, in 03/2013 was 50-55%. Subsequent NST 07/17/13 showed large old infarct/scar involving the inferior wall, lateral wall and apex. There was no significant reversibility. He was placed on medical therapy with plans to recheck LVF in several months. Dr. Lovena Le had recommended possible  upgrade to a biventricular ICD if no significant improvement. His last OV with Dr. Lovena Le was 04/28/14 and he wanted him to get a f/u echo in 6 months.  He also has a h/o DVT and PE, HTN and HLD.   He presented to [facility] on 09/03/14 with complaints of chest pain. He was admitted by [facility].  Troponins slightly abormal x 2 with flat low-level trend 0.10, 0.08. EKGs show atrial flutter with a CVR. This appears to be a new diagnosis. CXR is also questionable for  RLL PNA. There is cardiomegaly w/o edema. Repeat 2D echo today demonstrates new reduction in EF to 15%.   Home Medications  Prior to Admission medications   Medication Sig Start Date End Date Taking? Authorizing Provider  aspirin 81 MG EC tablet Take 1 tablet (81 mg total) by mouth daily. 08/26/13  Yes Coral Spikes, DO  calcium carbonate (TUMS - DOSED IN MG ELEMENTAL CALCIUM) 500 MG chewable tablet Chew 1 tablet by mouth as needed for indigestion or heartburn.   Yes Historical Provider, MD  furosemide (LASIX) 40 MG tablet Take 1 tablet (40 mg total) by mouth daily. 04/25/14  Yes Coral Spikes, DO  lisinopril (PRINIVIL,ZESTRIL) 5 MG tablet Take 1 tablet (5 mg total) by mouth daily. 08/26/13  Yes Coral Spikes, DO  metoprolol (LOPRESSOR) 50 MG tablet Take 0.5 tablets (25 mg total) by mouth 2 (two) times daily.  08/26/13  Yes Coral Spikes, DO  rivaroxaban (XARELTO) 20 MG TABS tablet Take 1 tablet (20 mg total) by mouth daily with supper. 08/09/14  Yes [place] N Rumley, DO  sildenafil (VIAGRA) 100 MG tablet Take 1 tablet (100 mg total) by mouth as needed for erectile dysfunction. 04/28/14  Yes Evans Lance, MD  simvastatin (ZOCOR) 20 MG tablet Take 1 tablet (20 mg total) by mouth daily at 6 PM. 04/28/14  Yes Evans Lance, MD  spironolactone (ALDACTONE) 25 MG tablet Take 0.5 tablets (12.5 mg total) by mouth daily. 08/26/13  Yes Coral Spikes, DO    Family History  Family History  Problem Relation Age of Onset  . Diabetes type II Mother   . Heart  disease Father     Social History  History   Social History  . Marital Status: Married    Spouse Name: N/A  . Number of Children: N/A  . Years of Education: N/A   Occupational History  . Not on file.   Social History Main Topics  . Smoking status: Former Smoker -- 1.50 packs/day for 10 years    Types: Cigarettes  . Smokeless tobacco: Never Used     Comment: 09/02/2014  "quit smoking 20-30 yr ago"  . Alcohol Use: No  . Drug Use: No  . Sexual Activity: Yes   Other Topics Concern  . Not on file   Social History Narrative     Review of Systems General:  No chills, fever, night sweats or weight changes.  Cardiovascular:  No chest pain, dyspnea on exertion, edema, orthopnea, palpitations, paroxysmal nocturnal dyspnea. Dermatological: No rash, lesions/masses Respiratory: No cough, dyspnea Urologic: No hematuria, dysuria Abdominal:   No nausea, vomiting, diarrhea, bright red blood per rectum, melena, or hematemesis Neurologic:  No visual changes, wkns, changes in mental status. All other systems reviewed and are otherwise negative except as noted above.  Physical Exam  Blood pressure 110/70, pulse 98, temperature 98.6 F (37 C), temperature source Oral, resp. rate 17, height 6\' 1"  (1.854 m), weight 200 lb 9.9 oz (91 kg), SpO2 99 %.  General: Pleasant, NAD Psych: Normal affect. Neuro: Alert and oriented X 3. Moves all extremities spontaneously. HEENT: Normal  Neck: Supple without bruits or JVD. Lungs:  Resp regular and unlabored, slightly decreased BS on the right. Heart: irregular rhythm, no s3, s4, or murmurs. Abdomen: Soft, non-tender, non-distended, BS + x 4.  Extremities: No clubbing, or cyanosis. Tense bilateral LEE. DP/PT/Radials 2+ and equal bilaterally.  Labs  Troponin (Point of Care Test) No results for input(s): TROPIPOC in the last 72 hours.  Recent Labs  09/02/14 1711 09/03/14 0104 09/03/14 0336  TROPONINI 0.17* 0.10* 0.08*   Lab Results    Component Value Date   WBC 12.2* 09/03/2014   HGB 15.9 09/03/2014   HCT 46.0 09/03/2014   MCV 96.2 09/03/2014   PLT 196 09/03/2014    Recent Labs Lab 09/03/14 1233  NA 129*  K 5.6*  CL 93*  CO2 26  BUN 37*  CREATININE 1.60*  CALCIUM 8.9  PROT 7.2  BILITOT 2.2*  ALKPHOS 98  ALT 216*  AST 127*  GLUCOSE 209*   Lab Results  Component Value Date   CHOL 101 09/03/2014   HDL 21* 09/03/2014   LDLCALC 67 09/03/2014   TRIG 65 09/03/2014   Lab Results  Component Value Date   DDIMER 0.22 02/03/2007   Radiology/Studies  Dg Chest 2 View  09/02/2014   CLINICAL  DATA:  Arrhythmia and hiccups for 3 days.  EXAM: CHEST  2 VIEW  COMPARISON:  04/12/2014 and prior radiographs  FINDINGS: Cardiomegaly and left sided pacemaker again noted.  Right lower lobe airspace disease is compatible with pneumonia.  There is no evidence of pulmonary edema, suspicious pulmonary nodule/mass, pleural effusion, or pneumothorax. No acute bony abnormalities are identified.  IMPRESSION: Right lower lobe airspace disease compatible with pneumonia. Radiographic follow-up to resolution is recommended.  Cardiomegaly.   Electronically Signed   By: Margarette Canada M.D.   On: 09/02/2014 18:54   Ct Head Wo Contrast  09/02/2014   CLINICAL DATA:  Somnolence  EXAM: CT HEAD WITHOUT CONTRAST  TECHNIQUE: Contiguous axial images were obtained from the base of the skull through the vertex without intravenous contrast.  COMPARISON:  None.  FINDINGS: There is encephalomalacia due to remote infarction in the right caudate and anterior basal ganglia. There is no intracranial hemorrhage, mass or evidence of acute infarction. Gray-white differentiation is preserved. There is mild generalized atrophy. There is no bony abnormality. Visible paranasal sinuses are clear.  IMPRESSION: Remote right caudate and basal ganglia infarction. Mild generalized atrophy. No acute findings.   Electronically Signed   By: Andreas Newport M.D.   On: 09/02/2014  23:27   ECG  Atrial flutter with CVR  Echocardiogram Study Conclusions  - Left ventricle: The cavity size was severely dilated. Wall thickness was normal. The estimated ejection fraction was 15%. Diffuse hypokinesis. Doppler parameters are consistent with high ventricular filling pressure. - Aortic valve: There was mild regurgitation. - Mitral valve: There was mild regurgitation. - Left atrium: The atrium was mildly dilated. - Right ventricle: The cavity size was mildly dilated. - Atrial septum: No defect or patent foramen ovale was identified. - Tricuspid valve: There was moderate regurgitation. - Pulmonary arteries: PA peak pressure: 69 mm Hg (S). - Line: A venous catheter was visualized in the superior vena cava, with its tip in the right atrium. No abnormal features noted. - Impressions: No mural apical thrombus with definity.  Impressions:  - No mural apical thrombus with definity.    ASSESSMENT AND PLAN  Active Problems:   History of DVT (deep vein thrombosis)   Hx of pulmonary embolus   Third degree heart block   Pacemaker   Atrial tachycardia   CAP (community acquired pneumonia)   Chronic combined systolic and diastolic CHF (congestive heart failure)   Dyslipidemia   Sleeping difficulty   Anxiety   Chest pain   1. Chronic Systolic CHF: with further reduction in EF, now at 15% by recent echo. Previously 20-25% 07/2013. This is in the setting of new atrial arrhythmia. He will need upgrade to BiV ICD. Will ask EP to see. Continue BB therapy. ACE-I is on hold for ARI with SCr in at 1.74.   2. Atrial Flutter, New Diagnosis: CVR. In the setting of CAP. Check TSH. He has a PPM. Will interrogate device to determine exact onset. Continue BB therapy for rate control. He has a CHA2DS2 VASc score of at least 2 given his CHF and HTN. He has been on Xarelto for DVT/ PE. Continue Xarelto now for stroke prophylaxis. Will ask EP to evaluate.   3. Elevated Troponin:  flat low-level trend. Likely due to demand ischemia in the setting of CHF, atrial flutter and acute PNA.   4. H/O PE/ DVT: on Xarelto.   5. ? RLL PNA: primary following. Currently on antibiotics.   6. H/O CHB: has PPM. May need  upgrade to BiV ICD given persistently low EF despite medical therapy.   7. HTN: BP controlled.    Signed, Lyda Jester, PA-C 09/03/2014, 3:20 PM   The patient was seen, examined and discussed with Brittainy M. Rosita Fire, PA-C and I agree with the above.   64 year old male with known systolic CHF, s/p PPM placement (DDD) in 03/2013 for 3. AVB. He was on Xarelto at home for h/o DVT. The patient has known low EF 15 %, no prior ischemic work up. He presented with dyspnea, orthopnea, LE edema and hiccups. He has acute on chronic kidney disease and minimally elevated troponin.  He was diagnosed with RLL pneumonia and started on antibiotics, of note he denies fevers/chills and cough.  On physical exam he has LE edema, elevated JVDs and difficulties breathing. His ECG shows new atrial flutter. His echocardiogram shows severely dilated left ventricle with LVEF of 15%, that is unchanged from prior echo, high LVEDP. He also has severe pulmonary hypertension, RVSP 69 mmHg.   I would start him on lasix 40 mg iv BID for acute on chronic systolic CHF. Monitor Crea closely. Elevated LFTs are most probably a result of CHF. He is on Xarelto, we will start amiodarone drip, if he doesn't cardiovert on his own, we will plan for a TEE/DCCV on Monday morning.  His troponin is most probably sec to CHF and acute kidney failure.   He will require BiV ICD, we will ask EP for a consult.   Dorothy Spark 09/03/2014

## 2014-09-03 NOTE — Progress Notes (Signed)
Patient states that he is having difficulty sleeping and has pain in his neck. Requesting a pill to help him sleep and wants tramadol ordered. Family practice teaching service paged. Tylenol was given at this time. [facility] [employer]

## 2014-09-03 NOTE — Progress Notes (Signed)
  Echocardiogram 2D Echocardiogram with Definity has been performed.  Philip Chavez 09/03/2014, 11:08 AM

## 2014-09-04 ENCOUNTER — Inpatient Hospital Stay (HOSPITAL_COMMUNITY): Payer: BC Managed Care – PPO

## 2014-09-04 DIAGNOSIS — E785 Hyperlipidemia, unspecified: Secondary | ICD-10-CM

## 2014-09-04 DIAGNOSIS — I4892 Unspecified atrial flutter: Secondary | ICD-10-CM | POA: Insufficient documentation

## 2014-09-04 DIAGNOSIS — I442 Atrioventricular block, complete: Secondary | ICD-10-CM

## 2014-09-04 DIAGNOSIS — I5043 Acute on chronic combined systolic (congestive) and diastolic (congestive) heart failure: Secondary | ICD-10-CM | POA: Insufficient documentation

## 2014-09-04 DIAGNOSIS — J189 Pneumonia, unspecified organism: Secondary | ICD-10-CM

## 2014-09-04 DIAGNOSIS — Z86718 Personal history of other venous thrombosis and embolism: Secondary | ICD-10-CM

## 2014-09-04 LAB — CBC
HCT: 43.7 % (ref 39.0–52.0)
Hemoglobin: 14.9 g/dL (ref 13.0–17.0)
MCH: 32.7 pg (ref 26.0–34.0)
MCHC: 34.1 g/dL (ref 30.0–36.0)
MCV: 96 fL (ref 78.0–100.0)
Platelets: 220 10*3/uL (ref 150–400)
RBC: 4.55 MIL/uL (ref 4.22–5.81)
RDW: 14.1 % (ref 11.5–15.5)
WBC: 11.5 10*3/uL — ABNORMAL HIGH (ref 4.0–10.5)

## 2014-09-04 LAB — BASIC METABOLIC PANEL
Anion gap: 11 (ref 5–15)
Anion gap: 9 (ref 5–15)
BUN: 45 mg/dL — AB (ref 6–20)
BUN: 39 mg/dL — AB (ref 6–20)
CALCIUM: 8.3 mg/dL — AB (ref 8.9–10.3)
CHLORIDE: 94 mmol/L — AB (ref 101–111)
CHLORIDE: 94 mmol/L — AB (ref 101–111)
CO2: 24 mmol/L (ref 22–32)
CO2: 26 mmol/L (ref 22–32)
CREATININE: 1.78 mg/dL — AB (ref 0.61–1.24)
Calcium: 8.6 mg/dL — ABNORMAL LOW (ref 8.9–10.3)
Creatinine, Ser: 1.55 mg/dL — ABNORMAL HIGH (ref 0.61–1.24)
GFR calc Af Amer: 45 mL/min — ABNORMAL LOW (ref >60)
GFR calc non Af Amer: 39 mL/min — ABNORMAL LOW (ref >60)
GFR calc non Af Amer: 46 mL/min — ABNORMAL LOW (ref >60)
GFR, EST AFRICAN AMERICAN: 53 mL/min — AB (ref >60)
Glucose, Bld: 232 mg/dL — ABNORMAL HIGH (ref 65–99)
Glucose, Bld: 264 mg/dL — ABNORMAL HIGH (ref 65–99)
POTASSIUM: 5.4 mmol/L — AB (ref 3.5–5.1)
Potassium: 5.1 mmol/L (ref 3.5–5.1)
SODIUM: 129 mmol/L — AB (ref 135–145)
Sodium: 129 mmol/L — ABNORMAL LOW (ref 135–145)

## 2014-09-04 LAB — CARBOXYHEMOGLOBIN
Carboxyhemoglobin: 1.1 % (ref 0.5–1.5)
Methemoglobin: 1.2 % (ref 0.0–1.5)
O2 Saturation: 50.6 %
Total hemoglobin: 14.6 g/dL (ref 13.5–18.0)

## 2014-09-04 LAB — GRAM STAIN

## 2014-09-04 LAB — URINALYSIS, ROUTINE W REFLEX MICROSCOPIC
Bilirubin Urine: NEGATIVE
GLUCOSE, UA: 100 mg/dL — AB
Ketones, ur: NEGATIVE mg/dL
Leukocytes, UA: NEGATIVE
Nitrite: NEGATIVE
PROTEIN: NEGATIVE mg/dL
Specific Gravity, Urine: 1.013 (ref 1.005–1.030)
Urobilinogen, UA: 0.2 mg/dL (ref 0.0–1.0)
pH: 5.5 (ref 5.0–8.0)

## 2014-09-04 LAB — HEPATIC FUNCTION PANEL
ALK PHOS: 126 U/L (ref 38–126)
ALT: 205 U/L — ABNORMAL HIGH (ref 17–63)
AST: 125 U/L — ABNORMAL HIGH (ref 15–41)
Albumin: 2.6 g/dL — ABNORMAL LOW (ref 3.5–5.0)
Bilirubin, Direct: 0.7 mg/dL — ABNORMAL HIGH (ref 0.1–0.5)
Indirect Bilirubin: 0.7 mg/dL (ref 0.3–0.9)
TOTAL PROTEIN: 6.1 g/dL — AB (ref 6.5–8.1)
Total Bilirubin: 1.4 mg/dL — ABNORMAL HIGH (ref 0.3–1.2)

## 2014-09-04 LAB — HEPATITIS PANEL, ACUTE
HEP A IGM: NEGATIVE
HEP B C IGM: NEGATIVE
Hepatitis B Surface Ag: NEGATIVE

## 2014-09-04 LAB — URINE MICROSCOPIC-ADD ON

## 2014-09-04 LAB — OCCULT BLOOD X 1 CARD TO LAB, STOOL: Fecal Occult Bld: NEGATIVE

## 2014-09-04 LAB — CK: Total CK: 323 U/L (ref 49–397)

## 2014-09-04 MED ORDER — BACLOFEN 10 MG PO TABS
10.0000 mg | ORAL_TABLET | Freq: Once | ORAL | Status: AC
  Administered 2014-09-04: 10 mg via ORAL
  Filled 2014-09-04: qty 1

## 2014-09-04 MED ORDER — DOXYCYCLINE HYCLATE 100 MG PO TABS
100.0000 mg | ORAL_TABLET | Freq: Two times a day (BID) | ORAL | Status: DC
  Administered 2014-09-04 – 2014-09-06 (×5): 100 mg via ORAL
  Filled 2014-09-04 (×5): qty 1

## 2014-09-04 MED ORDER — DIPHENHYDRAMINE HCL 25 MG PO CAPS
25.0000 mg | ORAL_CAPSULE | Freq: Once | ORAL | Status: AC
  Administered 2014-09-04: 25 mg via ORAL
  Filled 2014-09-04: qty 1

## 2014-09-04 MED ORDER — SODIUM POLYSTYRENE SULFONATE 15 GM/60ML PO SUSP
15.0000 g | Freq: Once | ORAL | Status: AC
  Administered 2014-09-04: 15 g via ORAL
  Filled 2014-09-04: qty 60

## 2014-09-04 MED ORDER — FUROSEMIDE 10 MG/ML IJ SOLN
80.0000 mg | Freq: Two times a day (BID) | INTRAMUSCULAR | Status: DC
  Administered 2014-09-04 – 2014-09-05 (×2): 80 mg via INTRAVENOUS
  Filled 2014-09-04 (×2): qty 8

## 2014-09-04 MED ORDER — DIGOXIN 125 MCG PO TABS
0.1250 mg | ORAL_TABLET | Freq: Every day | ORAL | Status: DC
  Administered 2014-09-04 – 2014-09-08 (×5): 0.125 mg via ORAL
  Filled 2014-09-04 (×5): qty 1

## 2014-09-04 NOTE — Progress Notes (Addendum)
Patient was given baclofen for continued neck pain. He was assisted up to the [facility] and had a bowel movement and noted to have some bright red blood from penis. Denies pain. MD on call paged to notify. Patient also states he is having panic attacks [facility] RN

## 2014-09-04 NOTE — Procedures (Signed)
Central Venous Catheter Insertion Procedure Note Philip Chavez [id number] 10-Oct-1950  Procedure: Insertion of Central Venous Catheter Indications: Assessment of intravascular volume, Drug and/or fluid administration and Frequent blood sampling  Procedure Details Consent: Risks of procedure as well as the alternatives and risks of each were explained to the (patient/caregiver).  Consent for procedure obtained. Time Out: Verified patient identification, verified procedure, site/side was marked, verified correct patient position, special equipment/implants available, medications/allergies/relevent history reviewed, required imaging and test results available.  Performed  Maximum sterile technique was used including antiseptics, cap, gloves, gown, hand hygiene, mask and sheet. Skin prep: Chlorhexidine; local anesthetic administered A antimicrobial bonded/coated triple lumen catheter was placed in the right internal jugular vein using the Seldinger technique. Ultrasound guidance used.Yes.   Catheter placed to 16 cm. Blood aspirated via all 3 ports and then flushed x 3. Line sutured x 2 and dressing applied.  Evaluation Blood flow good Complications: No apparent complications Patient did tolerate procedure well. Chest X-ray ordered to verify placement.  CXR: pending.  Richardson Landry Minor ACNP [facility] PCCM Pager [phone] till 3 pm If no answer page [phone] 09/04/2014, 1:58 PM   Attending  I was present for and supervised the procedure  Roselie Awkward, MD [place] [facility] Pager: [phone] Cell: [phone] After 3pm or if no response, call [phone]

## 2014-09-04 NOTE — Progress Notes (Signed)
Pharmacist Heart Failure Core Measure Documentation  Assessment: Philip Chavez has an EF documented as 15% on 7/30 by echo.  Rationale: Heart failure patients with left ventricular systolic dysfunction (LVSD) and an EF < 40% should be prescribed an angiotensin converting enzyme inhibitor (ACEI) or angiotensin receptor blocker (ARB) at discharge unless a contraindication is documented in the medical record.  This patient is not currently on an ACEI or ARB for HF.  This note is being placed in the record in order to provide documentation that a contraindication to the use of these agents is present for this encounter.  ACE Inhibitor or Angiotensin Receptor Blocker is contraindicated (specify all that apply)  []   ACEI allergy AND ARB allergy []   Angioedema []   Moderate or severe aortic stenosis []   Hyperkalemia [x]   Hypotension []   Renal artery stenosis [x]   Worsening renal function, preexisting renal disease or dysfunction    Joya San, PharmD Clinical Pharmacist Pager # [phone] 09/04/2014 1:35 PM

## 2014-09-04 NOTE — Care Management Note (Signed)
Case Management Note  Patient Details  Name: Lamount Bankson MRN: [id number] Date of Birth: 08-14-1950  Subjective/Objective:                 From home with wife, independent with ADL'S prior to hospitalization. Admitted with CP/ PNA, pt with significant cardiac hx (pacer, afib/flutter, severe systolic CHF).   Action/Plan:  Return to home when medically stable. CM to f/u with d/c needs. Expected Discharge Date:                  Expected Discharge Plan:  Home/Self Care  In-House Referral:     Discharge planning Services  CM Consult  Post Acute Care Choice:    Choice offered to:     DME Arranged:    DME Agency:     HH Arranged:    HH Agency:     Status of Service:  In process, will continue to follow  Medicare Important Message Given:    Date Medicare IM Given:    Medicare IM give by:    Date Additional Medicare IM Given:    Additional Medicare Important Message give by:     If discussed at [place] of Stay Meetings, dates discussed:    Additional Comments: Egon Dittus (Spouse)  [phone]  Sharin Mons, RN 09/04/2014, 9:20 AM

## 2014-09-04 NOTE — Discharge Instructions (Addendum)
You were admitted to [facility] for pneumonia and received 6 days of antibotic. You were also found to in Atrial flutter. Several medications were used to restore your rhythm. Cardiology was consulted. They started you on lasix for your SOB as this was due to your congestive heart failure. Your heart was visualized with an Echocardiogram, and it was found that your Ejection Fraction was 15% which was lower than previously. Cardiology will follow up with you with regard to your Congestive Heart failure.   Please Continue Cefuroxime 500 mg twice a day until 09/09/2014  Please follow up with your cardiologist  Please follow up with the [facility]   Please Contact Primary care physician for fever 101.5, shortness of breath, or severe Chest pain    Information on my medicine - XARELTO (rivaroxaban)  This medication education was reviewed with me or my healthcare representative as part of my discharge preparation.   WHY WAS XARELTO PRESCRIBED FOR YOU? Xarelto was prescribed to treat blood clots that may have been found in the veins of your legs (deep vein thrombosis) or in your lungs (pulmonary embolism) and to reduce the risk of them occurring again.  What do you need to know about Xarelto? Take one 20 mg tablet taken ONCE A DAY with your evening meal.  DO NOT stop taking Xarelto without talking to the health care provider who prescribed the medication.  Refill your prescription for 20 mg tablets before you run out.  After discharge, you should have regular check-up appointments with your healthcare provider that is prescribing your Xarelto.  In the future your dose may need to be changed if your kidney function changes by a significant amount.  What do you do if you miss a dose? If you are taking Xarelto TWICE DAILY and you miss a dose, take it as soon as you remember. You may take two 15 mg tablets (total 30 mg) at the same time then resume your regularly scheduled 15 mg twice  daily the next day.  If you are taking Xarelto ONCE DAILY and you miss a dose, take it as soon as you remember on the same day then continue your regularly scheduled once daily regimen the next day. Do not take two doses of Xarelto at the same time.   Important Safety Information Xarelto is a blood thinner medicine that can cause bleeding. You should call your healthcare provider right away if you experience any of the following: ? Bleeding from an injury or your nose that does not stop. ? Unusual colored urine (red or dark brown) or unusual colored stools (red or black). ? Unusual bruising for unknown reasons. ? A serious fall or if you hit your head (even if there is no bleeding).  Some medicines may interact with Xarelto and might increase your risk of bleeding while on Xarelto. To help avoid this, consult your healthcare provider or pharmacist prior to using any new prescription or non-prescription medications, including herbals, vitamins, non-steroidal anti-inflammatory drugs (NSAIDs) and supplements.  This website has more information on Xarelto: [email or web address].[email or web address]

## 2014-09-04 NOTE — Progress Notes (Addendum)
Patient Name: Philip Chavez Date of Encounter: 09/04/2014  Active Problems:   History of DVT (deep vein thrombosis)   Hx of pulmonary embolus   Third degree heart block   Pacemaker   Atrial tachycardia   CAP (community acquired pneumonia)   Chronic combined systolic and diastolic CHF (congestive heart failure)   Dyslipidemia   Sleeping difficulty   Anxiety   Chest pain   Length of Stay: 2  SUBJECTIVE  The patient feels SOB and continues to have hiccups.   CURRENT MEDS . aspirin  324 mg Oral Once  . aspirin EC  81 mg Oral Daily  . cefTRIAXone (ROCEPHIN)  IV  1 g Intravenous Q24H  . furosemide  40 mg Intravenous BID  . metoprolol tartrate  50 mg Oral BID  . predniSONE  40 mg Oral Q breakfast  . rivaroxaban  20 mg Oral Q supper    OBJECTIVE  Filed Vitals:   09/04/14 0030 09/04/14 0400 09/04/14 0445 09/04/14 1033  BP: 95/63 108/68 108/68 116/70  Pulse: 64 60 60 65  Temp:  98.4 F (36.9 C)    TempSrc:      Resp:  29 0   Height:      Weight:  200 lb 1.6 oz (90.765 kg)    SpO2: 97% 94% 86%     Intake/Output Summary (Last 24 hours) at 09/04/14 1044 Last data filed at 09/04/14 0500  Gross per 24 hour  Intake 4179.11 ml  Output    650 ml  Net 3529.11 ml   Filed Weights   09/02/14 1655 09/02/14 2346 09/04/14 0400  Weight: 192 lb 12.8 oz (87.454 kg) 200 lb 9.9 oz (91 kg) 200 lb 1.6 oz (90.765 kg)   PHYSICAL EXAM  General: Pleasant, SOB at rest Neuro: Alert and oriented X 3. Moves all extremities spontaneously. Psych: Normal affect. HEENT:  Normal  Neck: Supple without bruits, JVD + 10. Lungs:  Resp regular and unlabored, CTA. Heart: RRR no s3, s4, or murmurs. Abdomen: Soft, non-tender, non-distended, BS + x 4.  Extremities: No clubbing, cyanosis, B/L LE edema. DP/PT/Radials 2+ and equal bilaterally.  Accessory Clinical Findings  CBC  Recent Labs  09/02/14 1711 09/03/14 1233  WBC 12.3* 12.2*  HGB 15.1 15.9  HCT 43.2 46.0  MCV 93.7 96.2  PLT  203 202   Basic Metabolic Panel  Recent Labs  09/03/14 1233 09/04/14 0324  NA 129* 129*  K 5.6* 5.4*  CL 93* 94*  CO2 26 24  GLUCOSE 209* 232*  BUN 37* 45*  CREATININE 1.60* 1.78*  CALCIUM 8.9 8.3*   Liver Function Tests  Recent Labs  09/03/14 1233 09/04/14 0324  AST 127* 125*  ALT 216* 205*  ALKPHOS 98 126  BILITOT 2.2* 1.4*  PROT 7.2 6.1*  ALBUMIN 3.2* 2.6*    Recent Labs  09/03/14 0018  LIPASE 16*   Cardiac Enzymes  Recent Labs  09/02/14 1711 09/03/14 0104 09/03/14 0336  TROPONINI 0.17* 0.10* 0.08*    Recent Labs  09/03/14 1233  CHOL 101  HDL 21*  LDLCALC 67  TRIG 65  CHOLHDL 4.8   Radiology/Studies  Dg Chest 2 View  09/02/2014   CLINICAL DATA:  Arrhythmia and hiccups for 3 days.  EXAM: CHEST  2 VIEW  COMPARISON:  04/12/2014 and prior radiographs  FINDINGS: Cardiomegaly and left sided pacemaker again noted.  Right lower lobe airspace disease is compatible with pneumonia.  There is no evidence of pulmonary edema, suspicious pulmonary nodule/mass,  pleural effusion, or pneumothorax. No acute bony abnormalities are identified.  IMPRESSION: Right lower lobe airspace disease compatible with pneumonia. Radiographic follow-up to resolution is recommended.  Cardiomegaly.   Electronically Signed   By: Margarette Canada M.D.   On: 09/02/2014 18:54   Ct Head Wo Contrast  09/02/2014   CLINICAL DATA:  Somnolence  EXAM: CT HEAD WITHOUT CONTRAST  TECHNIQUE: Contiguous axial images were obtained from the base of the skull through the vertex without intravenous contrast.  COMPARISON:  None.  FINDINGS: There is encephalomalacia due to remote infarction in the right caudate and anterior basal ganglia. There is no intracranial hemorrhage, mass or evidence of acute infarction. Gray-white differentiation is preserved. There is mild generalized atrophy. There is no bony abnormality. Visible paranasal sinuses are clear.  IMPRESSION: Remote right caudate and basal ganglia infarction.  Mild generalized atrophy. No acute findings.   Electronically Signed   By: Andreas Newport M.D.   On: 09/02/2014 23:27   TELE: a-flutter, V paced rhythm     ASSESSMENT AND PLAN  The patient was seen, examined and discussed with Brittainy M. Rosita Fire, PA-C and I agree with the above.   64 year old male with known systolic CHF, s/p PPM placement (DDD) in 03/2013 for 3. AVB. He was on Xarelto at home for h/o DVT. The patient has known low EF 15 %, no prior ischemic work up. He presented with dyspnea, orthopnea, LE edema and hiccups. He has acute on chronic kidney disease and minimally elevated troponin.  He was diagnosed with RLL pneumonia and started on antibiotics, of note he denies fevers/chills and cough.  On physical exam he has LE edema, elevated JVDs and difficulties breathing. His ECG shows new atrial flutter. His echocardiogram shows severely dilated left ventricle with LVEF of 15%, that is unchanged from prior echo, high LVEDP. He also has severe pulmonary hypertension, RVSP 69 mmHg.   Satrted on lasix 40 mg iv BID for acute on chronic systolic CHF with minimal diuresis, Crea worsened. We will further increase lasix to 80 mg iv BID. Elevated LFTs are most probably a result of CHF. He is on Xarelto, started amiodarone drip, if he doesn't cardiovert on his own, we will plan for a TEE/DCCV on Monday morning ( two missed doses of xarelto on admission).  His troponin is most probably sec to CHF and acute kidney failure.   We will ask CHF for further management, we will transfer him to step down, start a central line and check co-ox. Start milrinone if low.  He will require BiV ICD, we will ask EP for a consult.   Signed, Dorothy Spark MD, [facility] 09/04/2014

## 2014-09-04 NOTE — Progress Notes (Signed)
Geannie Risen  PAC updated on central line and availability of CarboxyHemoglobin results.

## 2014-09-04 NOTE — Progress Notes (Signed)
Utilization Review Completed.Philip Chavez T7/31/2016  

## 2014-09-04 NOTE — Progress Notes (Signed)
Family Medicine Teaching Service Daily Progress Note Intern Pager: [phone]  Patient name: Philip Chavez Medical record number: [id number] Date of birth: 22-Jun-1950 Age: 64 y.o. Gender: male  Primary Care Provider: Junie Panning, DO Consultants: cardiology, EP Code Status: Full  Pt Overview and Major Events to Date:  7/29: presented with hiccups, then had chest pain.  7/30: EF 15%, atrial flutter   Assessment and Plan: Philip Chavez is a 64 y.o. male presenting with chest pain. 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  CHF: EF 20% on echo in 07/2013. Repeat echo this admission with an EF 15% and pulmonary HTN at 40. Takes Lasix 40 mg, lisinopril, lopressor, simvastatin, and spironolactone at home.  - Hold home meds  - Lasix $Remov'40mg'jcsbUA$  IV BID- monitor fluid status and SCr (incresed from 1.6 >1.78) - Cardiology to contact EP- will most likely need ICD. - will re-start home metop. Holding off on lisinopril and spiro given AKI and BP - monitor I&Os: not great documentation (+3L during hospitalization)  - weights : 192>  200 lb  Atrial flutter with CVR: Still in atrial flutter.  - Cardiology following - amiodarone gtt, if he does not cariovert will need TEE/DCCV on Monday   Chest pain: Resolved. Began on day of admission and resolved. Was intermittent, midsternal without radiation and worsened by "thoughts." EKG in ED showed sinus rhythm with prolonged QT. Trop 0.17 >0.1 >0.08. EKG this AM with atrial flutter.  - continue to monitor on telemetry - f/u cards recs  Pneumonia, CAP: Received one dose of CTX and azithro in ED. Endorsed some SOB before presentation. CXR showed R lower lobe PNA with stable cardiomegaly. WBC slightly increased at 12.3 on admission without a repeat done. Negative strep pneumo.  - Treat for CAP with CTX per pharm and prednisone 40 mg  - holding azithromycin given concerns of prolonged QTc - Blood  cultures NGTD - f/u Legionella urine antigen  Transaminitis: New on this admission and appears to be worsening. ALT 172>216>205, AST 124>127>125 with a Tbili of 2.2>1.4.  hepatitis A, B and C negative. Given no elevation in alk phos, most likely not biliary in nature. Could be alcohol, Tylenol, or statin or vascular etiology.  - continue f/u hepatic function.  - holding simvastatin   Unwitnessed fall: patient's wife reports the patient fell the day PTA but she did not witness the fall and does not know if the patient hit his head - Given that patient is on Xarelto, obtained CT head that revealed  R caudate and basal ganglia infarct but no acute findings  - consider stroke work up in the outpatient setting.   AKI: Cr 1.74 on admission, some increase from 1.6 to 1.78, most likely from the addition of Lasix. - s/p 500 cc NS bolus - continue to monitor   Hiccups: given Benadryl and Reglan in ED - Improving, continue to monitor  HTN: home lisinopril, lopressor, spironolactone.  - Pt relatively hypotensive with systolics in low 109N  - Hold home spiro and lisinopril for now given BPs and AKI  History DVT/PE: on Xarelto at home - Continue home Xarelto  Hyponatremia: potentially secondary to patient's self-reported dehydration. Na 126 on admission and improving - Monitor - f/u BMP   FEN/GI: IV saline lock; heart healthy diet Prophylaxis: continue home Xarelto  Disposition: pending cardiology work up.   Subjective:  Patient doing well. Asked for cocaine overnight. Per MD report, wanted this for hiccups.  Pt stated he was having occasional R sided chest pain that has since resolved. No chest pain.   Objective: Temp:  [98 F (36.7 C)-98.6 F (37 C)] 98 F (36.7 C) (07/30 2355) Pulse Rate:  [41-99] 62 (07/30 2355) Resp:  [17-36] 21 (07/30 2355) BP: (110-130)/(70-83) 113/79 mmHg (07/30 2355) SpO2:  [86 %-100 %] 100 % (07/30 2355) Physical Exam: General: sitting up in bed in NAD.  Non-toxic Eyes: Conjunctivae non-injected.  ENTM: Moist mucous membranes. Oropharynx clear. No nasal discharge.  Neck: Supple, no LAD  Cardiovascular: RRR. No murmurs, rubs, or gallops noted. No pitting edema noted. Respiratory: No increased WOB. CTAB without wheezing, rhonchi, or crackles noted. Abdomen: +BS, soft, non-distended, non-tender.   Laboratory:  Recent Labs Lab 09/02/14 1711 09/03/14 1233  WBC 12.3* 12.2*  HGB 15.1 15.9  HCT 43.2 46.0  PLT 203 196    Recent Labs Lab 09/02/14 1711 09/03/14 0018 09/03/14 1233  NA 126*  --  129*  K 5.1  --  5.6*  CL 92*  --  93*  CO2 22  --  26  BUN 42*  --  37*  CREATININE 1.74*  --  1.60*  CALCIUM 8.9  --  8.9  PROT  --  6.6 7.2  BILITOT  --  2.5* 2.2*  ALKPHOS  --  92 98  ALT  --  172* 216*  AST  --  124* 127*  GLUCOSE 194*  --  209*    Hepatic Function Latest Ref Rng 09/04/2014 09/03/2014 09/03/2014  Total Protein 6.5 - 8.1 g/dL 6.1(L) 7.2 6.6  Albumin 3.5 - 5.0 g/dL 2.6(L) 3.2(L) 2.8(L)  AST 15 - 41 U/L 125(H) 127(H) 124(H)  ALT 17 - 63 U/L 205(H) 216(H) 172(H)  Alk Phosphatase 38 - 126 U/L 126 98 92  Total Bilirubin 0.3 - 1.2 mg/dL 1.4(H) 2.2(H) 2.5(H)  Bilirubin, Direct 0.1 - 0.5 mg/dL 0.7(H) 0.8(H) 0.8(H)     Blood cultures NGTD  Echo: EF15%. Diffuse hypokinesis. PA peak pressure: 69 mm Hg.   Dg Chest 2 View  09/02/2014   CLINICAL DATA:  Arrhythmia and hiccups for 3 days.  EXAM: CHEST  2 VIEW  COMPARISON:  04/12/2014 and prior radiographs  FINDINGS: Cardiomegaly and left sided pacemaker again noted.  Right lower lobe airspace disease is compatible with pneumonia.  There is no evidence of pulmonary edema, suspicious pulmonary nodule/mass, pleural effusion, or pneumothorax. No acute bony abnormalities are identified.  IMPRESSION: Right lower lobe airspace disease compatible with pneumonia. Radiographic follow-up to resolution is recommended.  Cardiomegaly.   Electronically Signed   By: Margarette Canada M.D.   On:  09/02/2014 18:54   Ct Head Wo Contrast  09/02/2014   CLINICAL DATA:  Somnolence  EXAM: CT HEAD WITHOUT CONTRAST  TECHNIQUE: Contiguous axial images were obtained from the base of the skull through the vertex without intravenous contrast.  COMPARISON:  None.  FINDINGS: There is encephalomalacia due to remote infarction in the right caudate and anterior basal ganglia. There is no intracranial hemorrhage, mass or evidence of acute infarction. Gray-white differentiation is preserved. There is mild generalized atrophy. There is no bony abnormality. Visible paranasal sinuses are clear.  IMPRESSION: Remote right caudate and basal ganglia infarction. Mild generalized atrophy. No acute findings.   Electronically Signed   By: Andreas Newport M.D.   On: 09/02/2014 23:27    Archie Patten, MD 09/04/2014, 1:15 AM PGY-2, [place] Intern pager: [phone], text pages welcome

## 2014-09-04 NOTE — Progress Notes (Signed)
Family Medicine Teaching Service Daily Progress Note Intern Pager: [phone]  Patient name: Philip Chavez Medical record number: [id number] Date of birth: [birth year] Age: 64 y.o. Gender: male  Primary Care Provider: Junie Panning, DO Consultants: Cardiology  Code Status: Full   Pt Overview and Major Events to Date:  7/29: presented with hiccups, then had chest pain.  7/30: EF 15%, atrial flutter   Assessment and Plan: Philip Chavez is a 64 y.o. male presenting with chest pain. 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  CHF: EF 20% on echo in 07/2013. Repeat echo this admission with an EF 15% and pulmonary HTN at 59. Takes Lasix 40 mg, lisinopril, lopressor simvastatin, and spironolactone at home.  - Hold home meds  - Lasix 40mg  IV BID- monitor fluid status and SCr (Trending down from 1.78 -> 1.55 (Baseline 1.1 -1.2)  - Cardiology to contact EP- will most likely need ICD. - BNP 1598.9 and CK 323  - Will continue Lopressor 50 mg BID and Digoxin 0.125 mg QD  - Continue to hold on lisinopril and spiro until Cr. Normalizes  - monitor I&Os: Currently negative 290 out   - weights : 198 lbs > 200 lb, will continue to monitor  Atrial flutter with CVR: No longer in Atrial flutter. Cardiology following - Per Cardiology cardiac cath at some point. However has converted out of atrial flutter and is on Xarelto. Will consider atrial flutter ablation here - Continue Telemetry  - amiodarone gtt - will transfer to PO for tomorrow   Pneumonia, CAP: Received one dose of CTX and azithro in ED. Endorsed some SOB before presentation. CXR showed R lower lobe PNA with stable cardiomegaly. WBC slightly increased at 12.3 trending with WBC 11.8 on 7/31. Negative strep pneumo.  - Continue CTX for (3 of 7 days)   - Will d/c prednisone 40 mg today  - Blood cultures NGTD x 2 days, Gram stain negative  - f/u Legionella urine antigen - pending    Transaminitis: New on this admission and appears to be worsening. ALT 172>216>205, AST 124>127>125 with a Tbili of 2.2>1.4. Hepatitis A, B and C negative. Alkaline phos wnl, normal gallbladder. Unlikely EtOH as  AST 2 x times greater than ALT. Mostly likely due to severe hepatic congestion from cor pulmonale as patient's EF is 15% - continue to follow hepatic function  - holding simvastatin   Unwitnessed fall: patient's wife reports the patient fell the day PTA but she did not witness the fall and does not know if the patient hit his head - Given that patient is on Xarelto, obtained CT head that revealed R caudate and basal ganglia infarct but no acute findings  - consider stroke work up in the outpatient setting.    AKI: Cr 1.74 on admission, some increase from 1.6 to 1.78, most likely from the addition of Lasix. Received  Bolus of NS 500 cc on 7/30. Patient currently trending down with Cr 1.55 on 7/31. Hyperkalemia likely due to patient's current AKI.  - Creatinine on 7/31 1.55   - continue to monitor    HTN: home lisinopril, lopressor, spironolactone.  - Pt relatively hypotensive with systolics in low 784O  - Hold home spiro and lisinopril for now given BPs and AKI  History DVT/PE: on Xarelto at home - Continue home Xarelto  Hyponatremia:  Na 126, likely due to CHF-> patient retaining fluid to compensate for blood pressure changes  - Monitor -  f/u BMP   Penial Pain:  U/S was performed yesterday significant only for small complex right hydrocele with debris and biilateral varicoceles. No testicular torsion noted with normal doppler flow.  Epididymis consider. UA negative for leukocytes, nitrates. Urine Gram stain negative. Urine culture currently pending. Will continue doxycyline until G/C pcr returns - Urine Culture pending  - Continue Doxycycline BID  -  G/C PCR pending  - Will continue to monitor  FEN/GI: IV saline lock; heart healthy diet Prophylaxis: continue home  Xarelto  Disposition: Floor status, discharge pending clinical improvement   Subjective:  Patient is doing okay this AM. He states that he has intermittent episodes of SOB. Patient endorses orthopnea. Denies chest pain, n/v, fever, chills, penial pain, dysuria, and penial discharge    Objective: Temp:  [97.5 F (36.4 C)-97.6 F (36.4 C)] 97.5 F (36.4 C) (08/01 0437) Pulse Rate:  [60-71] 71 (08/01 0437) Resp:  [10-26] 25 (08/01 0437) BP: (99-116)/(68-78) 112/75 mmHg (08/01 0437) SpO2:  [97 %-100 %] 97 % (08/01 0437) Weight:  [198 lb 14.4 oz (90.22 kg)] 198 lb 14.4 oz (90.22 kg) (08/01 0500) Physical Exam: General: sitting up in bed in NAD. Non-toxic Eyes: Conjunctivae non-injected with Scelra Icterus  ENTM: Moist mucous membranes. Oropharynx clear. No nasal discharge.  Neck: Supple, no LAD  Cardiovascular: Irregular rate, heart sounds distant. No murmurs, rubs, or gallops noted. Radial and DP pulses bounding No pitting edema noted. Respiratory: No increased WOB. CTAB without wheezing, rhonchi, or crackles noted. Abdomen: +BS, soft, non-distended, non-tender Gential: No swelling of scrotum, no discharge from penis or bumps noted.   Laboratory:  Recent Labs Lab 09/02/14 1711 09/03/14 1233 09/04/14 1030  WBC 12.3* 12.2* 11.5*  HGB 15.1 15.9 14.9  HCT 43.2 46.0 43.7  PLT 203 196 220    Recent Labs Lab 09/03/14 0018 09/03/14 1233 09/04/14 0324 09/04/14 1655  NA  --  129* 129* 129*  K  --  5.6* 5.4* 5.1  CL  --  93* 94* 94*  CO2  --  26 24 26   BUN  --  37* 45* 39*  CREATININE  --  1.60* 1.78* 1.55*  CALCIUM  --  8.9 8.3* 8.6*  PROT 6.6 7.2 6.1*  --   BILITOT 2.5* 2.2* 1.4*  --   ALKPHOS 92 98 126  --   ALT 172* 216* 205*  --   AST 124* 127* 125*  --   GLUCOSE  --  209* 232* 264*    Imaging/Diagnostic Tests:   Philip Speckman Cletis Media, MD 09/05/2014, 7:51 AM PGY-1, [place] Intern pager: [phone], text pages welcome

## 2014-09-05 ENCOUNTER — Encounter (HOSPITAL_COMMUNITY)
Admission: EM | Disposition: A | Discharge status: Home / Self Care | Payer: Self-pay | Source: Home / Self Care | Attending: Family Medicine

## 2014-09-05 ENCOUNTER — Encounter (HOSPITAL_COMMUNITY): Payer: Self-pay | Admitting: Nurse Practitioner

## 2014-09-05 ENCOUNTER — Other Ambulatory Visit: Payer: Self-pay

## 2014-09-05 DIAGNOSIS — R066 Hiccough: Secondary | ICD-10-CM

## 2014-09-05 DIAGNOSIS — I428 Other cardiomyopathies: Secondary | ICD-10-CM

## 2014-09-05 DIAGNOSIS — I429 Cardiomyopathy, unspecified: Secondary | ICD-10-CM

## 2014-09-05 DIAGNOSIS — N508 Other specified disorders of male genital organs: Secondary | ICD-10-CM

## 2014-09-05 DIAGNOSIS — N50819 Testicular pain, unspecified: Secondary | ICD-10-CM | POA: Insufficient documentation

## 2014-09-05 LAB — HEPATIC FUNCTION PANEL
ALK PHOS: 123 U/L (ref 38–126)
ALT: 196 U/L — AB (ref 17–63)
AST: 84 U/L — ABNORMAL HIGH (ref 15–41)
Albumin: 2.8 g/dL — ABNORMAL LOW (ref 3.5–5.0)
BILIRUBIN INDIRECT: 0.9 mg/dL (ref 0.3–0.9)
Bilirubin, Direct: 0.6 mg/dL — ABNORMAL HIGH (ref 0.1–0.5)
TOTAL PROTEIN: 6.6 g/dL (ref 6.5–8.1)
Total Bilirubin: 1.5 mg/dL — ABNORMAL HIGH (ref 0.3–1.2)

## 2014-09-05 LAB — BASIC METABOLIC PANEL
ANION GAP: 10 (ref 5–15)
Anion gap: 9 (ref 5–15)
BUN: 34 mg/dL — ABNORMAL HIGH (ref 6–20)
BUN: 34 mg/dL — AB (ref 6–20)
CO2: 28 mmol/L (ref 22–32)
CO2: 29 mmol/L (ref 22–32)
CREATININE: 1.36 mg/dL — AB (ref 0.61–1.24)
Calcium: 8.6 mg/dL — ABNORMAL LOW (ref 8.9–10.3)
Calcium: 8.7 mg/dL — ABNORMAL LOW (ref 8.9–10.3)
Chloride: 96 mmol/L — ABNORMAL LOW (ref 101–111)
Chloride: 96 mmol/L — ABNORMAL LOW (ref 101–111)
Creatinine, Ser: 1.48 mg/dL — ABNORMAL HIGH (ref 0.61–1.24)
GFR calc Af Amer: 56 mL/min — ABNORMAL LOW (ref >60)
GFR calc Af Amer: >60 mL/min (ref >60)
GFR calc non Af Amer: 49 mL/min — ABNORMAL LOW (ref >60)
GFR calc non Af Amer: 54 mL/min — ABNORMAL LOW (ref >60)
Glucose, Bld: 287 mg/dL — ABNORMAL HIGH (ref 65–99)
Glucose, Bld: 291 mg/dL — ABNORMAL HIGH (ref 65–99)
POTASSIUM: 4 mmol/L (ref 3.5–5.1)
Potassium: 4 mmol/L (ref 3.5–5.1)
SODIUM: 134 mmol/L — AB (ref 135–145)
Sodium: 134 mmol/L — ABNORMAL LOW (ref 135–145)

## 2014-09-05 LAB — URINE CULTURE: CULTURE: NO GROWTH

## 2014-09-05 LAB — LEGIONELLA ANTIGEN, URINE

## 2014-09-05 LAB — CARBOXYHEMOGLOBIN
Carboxyhemoglobin: 1 % (ref 0.5–1.5)
METHEMOGLOBIN: 0.8 % (ref 0.0–1.5)
O2 Saturation: 78 %
Total hemoglobin: 13.9 g/dL (ref 13.5–18.0)

## 2014-09-05 LAB — CBC
HCT: 47.6 % (ref 39.0–52.0)
Hemoglobin: 16.4 g/dL (ref 13.0–17.0)
MCH: 33.4 pg (ref 26.0–34.0)
MCHC: 34.5 g/dL (ref 30.0–36.0)
MCV: 96.9 fL (ref 78.0–100.0)
Platelets: 203 10*3/uL (ref 150–400)
RBC: 4.91 MIL/uL (ref 4.22–5.81)
RDW: 14.4 % (ref 11.5–15.5)
WBC: 13.1 10*3/uL — ABNORMAL HIGH (ref 4.0–10.5)

## 2014-09-05 LAB — HEMOGLOBIN A1C
HEMOGLOBIN A1C: 6.7 % — AB (ref 4.8–5.6)
Mean Plasma Glucose: 146 mg/dL

## 2014-09-05 LAB — GC/CHLAMYDIA PROBE AMP (~~LOC~~) NOT AT ARMC
Chlamydia: NEGATIVE
Neisseria Gonorrhea: NEGATIVE

## 2014-09-05 SURGERY — ECHOCARDIOGRAM, TRANSESOPHAGEAL
Anesthesia: Monitor Anesthesia Care

## 2014-09-05 MED ORDER — SODIUM CHLORIDE 0.9 % IV BOLUS (SEPSIS)
250.0000 mL | Freq: Once | INTRAVENOUS | Status: AC
  Administered 2014-09-05: 250 mL via INTRAVENOUS

## 2014-09-05 MED ORDER — SODIUM CHLORIDE 0.9 % IJ SOLN
10.0000 mL | INTRAMUSCULAR | Status: DC | PRN
  Administered 2014-09-07: 10 mL
  Filled 2014-09-05: qty 40

## 2014-09-05 MED ORDER — ISOSORB DINITRATE-HYDRALAZINE 20-37.5 MG PO TABS
0.5000 | ORAL_TABLET | Freq: Three times a day (TID) | ORAL | Status: DC
  Administered 2014-09-05 – 2014-09-08 (×7): 0.5 via ORAL
  Filled 2014-09-05 (×7): qty 1

## 2014-09-05 MED ORDER — CARVEDILOL 3.125 MG PO TABS
3.1250 mg | ORAL_TABLET | Freq: Two times a day (BID) | ORAL | Status: DC
  Administered 2014-09-05 – 2014-09-08 (×7): 3.125 mg via ORAL
  Filled 2014-09-05 (×7): qty 1

## 2014-09-05 MED ORDER — LISINOPRIL 2.5 MG PO TABS
2.5000 mg | ORAL_TABLET | Freq: Every day | ORAL | Status: DC
  Administered 2014-09-05 – 2014-09-07 (×3): 2.5 mg via ORAL
  Filled 2014-09-05 (×3): qty 1

## 2014-09-05 NOTE — Progress Notes (Signed)
Inpatient Diabetes Program Recommendations  AACE/ADA: New Consensus Statement on Inpatient Glycemic Control (2013)  Target Ranges:  Prepandial:   less than 140 mg/dL      Peak postprandial:   less than 180 mg/dL (1-2 hours)      Critically ill patients:  140 - 180 mg/dL   Results for EDILSON, VITAL (MRN [id number]) as of 09/05/2014 08:41  Ref. Range 09/02/2014 17:11 09/03/2014 12:33 09/04/2014 03:24 09/04/2014 16:55  Glucose Latest Ref Range: 65-99 mg/dL 194 (H) 209 (H) 232 (H) 264 (H)   Reason for Admission: CP/PNA/AKI  Diabetes history: None Current orders for Inpatient glycemic control: None  Inpatient Diabetes Program Recommendations Correction (SSI): Patient receiving steroids. Lab glucose in the mid 200's over the past few days. Last A1c 6.0 on 07/15/13 in the pre-diabetes range. While inpatient and on steroids, please consider adding Novolog Sensitive scale and HS coverage for steroid induced hyperglycemia. Inpatient goal < 180 mg/dl.  Thanks,  Tama Headings RN, MSN, [facility] Inpatient Diabetes Coordinator Team Pager [phone]

## 2014-09-05 NOTE — Progress Notes (Signed)
Patient ID: Philip Chavez, male   DOB: 31-Jan-1951, 64 y.o.   MRN: [id number]   64 yo with history of CHB s/p MDT PPM.  Echo in 2/15 showed EF 50-55% when pacemaker was placed.  However, EF had fallen to 20-25% by echo in 6/15.  Cardiolite at that time showed scar but no ischemia.  Patient additionally had a PE in 2/15.  Plan per Dr Lovena Le had been repeat echo with BiV upgrade if EF remained low. He also has a prior history of atrial tachycardia (paroxysmal).   Patient was admitted on 7/30 with dyspnea.  He was found to be in atrial flutter with controlled rate (new) and with possible RLL PNA.  Abx were started and he was put on amiodarone.  He has been on Xarelto.  He was thought to be volume overloaded but diuresed poorly initially with Lasix 40 mg IV bid.  Increasing Lasix to 80 mg IV bid improved diuresis.   Echo this admission with EF 15%, diffuse hypokinesis, RV mildly dilated with normal systolic function, PA systolic pressure 69 mmHg.   Today, he is back in NSR on amiodarone gtt.  Weight is down 2 lbs, good diuresis yesterday.  Co-ox yesterday while in atrial flutter was low at 51% and digoxin was started.  Co-ox up to 78% today.  Breathing is better.  Creatinine 1.55 yesterday, no BMET today.   ECG: NSR with V-pacing  Scheduled Meds: . aspirin  324 mg Oral Once  . carvedilol  3.125 mg Oral BID WC  . cefTRIAXone (ROCEPHIN)  IV  1 g Intravenous Q24H  . digoxin  0.125 mg Oral Daily  . doxycycline  100 mg Oral Q12H  . furosemide  80 mg Intravenous BID  . predniSONE  40 mg Oral Q breakfast  . rivaroxaban  20 mg Oral Q supper   Continuous Infusions: . amiodarone 30 mg/hr (09/04/14 1400)   PRN Meds:.sodium chloride   Filed Vitals:   09/04/14 2337 09/05/14 0037 09/05/14 0437 09/05/14 0500  BP: 110/77 99/68 112/75   Pulse: 62 60 71   Temp:   97.5 F (36.4 C)   TempSrc:      Resp: 26 16 25    Height:      Weight:    198 lb 14.4 oz (90.22 kg)  SpO2: 98% 100% 97%     Intake/Output  Summary (Last 24 hours) at 09/05/14 0857 Last data filed at 09/05/14 0844  Gross per 24 hour  Intake    240 ml  Output   3290 ml  Net  -3050 ml    LABS: Basic Metabolic Panel:  Recent Labs  09/04/14 0324 09/04/14 1655  NA 129* 129*  K 5.4* 5.1  CL 94* 94*  CO2 24 26  GLUCOSE 232* 264*  BUN 45* 39*  CREATININE 1.78* 1.55*  CALCIUM 8.3* 8.6*   Liver Function Tests:  Recent Labs  09/03/14 1233 09/04/14 0324  AST 127* 125*  ALT 216* 205*  ALKPHOS 98 126  BILITOT 2.2* 1.4*  PROT 7.2 6.1*  ALBUMIN 3.2* 2.6*    Recent Labs  09/03/14 0018  LIPASE 16*   CBC:  Recent Labs  09/03/14 1233 09/04/14 1030  WBC 12.2* 11.5*  HGB 15.9 14.9  HCT 46.0 43.7  MCV 96.2 96.0  PLT 196 220   Cardiac Enzymes:  Recent Labs  09/02/14 1711 09/03/14 0104 09/03/14 0336 09/04/14 1655  CKTOTAL  --   --   --  323  TROPONINI 0.17* 0.10* 0.08*  --  BNP: Invalid input(s): POCBNP D-Dimer: No results for input(s): DDIMER in the last 72 hours. Hemoglobin A1C: No results for input(s): HGBA1C in the last 72 hours. Fasting Lipid Panel:  Recent Labs  09/03/14 1233  CHOL 101  HDL 21*  LDLCALC 67  TRIG 65  CHOLHDL 4.8   Thyroid Function Tests: No results for input(s): TSH, T4TOTAL, T3FREE, THYROIDAB in the last 72 hours.  Invalid input(s): FREET3 Anemia Panel: No results for input(s): VITAMINB12, FOLATE, FERRITIN, TIBC, IRON, RETICCTPCT in the last 72 hours.  RADIOLOGY: Dg Chest 2 View  09/02/2014   CLINICAL DATA:  Arrhythmia and hiccups for 3 days.  EXAM: CHEST  2 VIEW  COMPARISON:  04/12/2014 and prior radiographs  FINDINGS: Cardiomegaly and left sided pacemaker again noted.  Right lower lobe airspace disease is compatible with pneumonia.  There is no evidence of pulmonary edema, suspicious pulmonary nodule/mass, pleural effusion, or pneumothorax. No acute bony abnormalities are identified.  IMPRESSION: Right lower lobe airspace disease compatible with pneumonia.  Radiographic follow-up to resolution is recommended.  Cardiomegaly.   Electronically Signed   By: Margarette Canada M.D.   On: 09/02/2014 18:54   Ct Head Wo Contrast  09/02/2014   CLINICAL DATA:  Somnolence  EXAM: CT HEAD WITHOUT CONTRAST  TECHNIQUE: Contiguous axial images were obtained from the base of the skull through the vertex without intravenous contrast.  COMPARISON:  None.  FINDINGS: There is encephalomalacia due to remote infarction in the right caudate and anterior basal ganglia. There is no intracranial hemorrhage, mass or evidence of acute infarction. Gray-white differentiation is preserved. There is mild generalized atrophy. There is no bony abnormality. Visible paranasal sinuses are clear.  IMPRESSION: Remote right caudate and basal ganglia infarction. Mild generalized atrophy. No acute findings.   Electronically Signed   By: Andreas Newport M.D.   On: 09/02/2014 23:27   US Scrotum  09/04/2014   CLINICAL DATA:  Right testicular pain, status post inguinal hernia repair  EXAM: SCROTAL ULTRASOUND  DOPPLER ULTRASOUND OF THE TESTICLES  TECHNIQUE: Complete ultrasound examination of the testicles, epididymis, and other scrotal structures was performed. Color and spectral Doppler ultrasound were also utilized to evaluate blood flow to the testicles.  COMPARISON:  None.  FINDINGS: Right testicle  Measurements: 4.2 x 2.7 x 3.2 cm. No mass or microlithiasis visualized.  Left testicle  Measurements: 4.2 x 2.5 x 3.1 cm. No mass or microlithiasis visualized.  Right epididymis:  Normal in size and appearance.  Left epididymis:  Normal in size and appearance.  Hydrocele: Small complex right hydrocele with debris. Physiologic fluid on the left.  Varicocele:  Present bilaterally.  Pulsed Doppler interrogation of both testes demonstrates normal low resistance arterial and venous waveforms bilaterally.  IMPRESSION: Normal sonographic appearance of the bilateral testes.  No evidence of testicular torsion.  Small complex  right hydrocele with debris.  Bilateral varicoceles.   Electronically Signed   By: Julian Hy M.D.   On: 09/04/2014 18:25   [place] Art/ven Flow Abd Pelv Doppler  09/04/2014   CLINICAL DATA:  Right testicular pain, status post inguinal hernia repair  EXAM: SCROTAL ULTRASOUND  DOPPLER ULTRASOUND OF THE TESTICLES  TECHNIQUE: Complete ultrasound examination of the testicles, epididymis, and other scrotal structures was performed. Color and spectral Doppler ultrasound were also utilized to evaluate blood flow to the testicles.  COMPARISON:  None.  FINDINGS: Right testicle  Measurements: 4.2 x 2.7 x 3.2 cm. No mass or microlithiasis visualized.  Left testicle  Measurements: 4.2 x 2.5  x 3.1 cm. No mass or microlithiasis visualized.  Right epididymis:  Normal in size and appearance.  Left epididymis:  Normal in size and appearance.  Hydrocele: Small complex right hydrocele with debris. Physiologic fluid on the left.  Varicocele:  Present bilaterally.  Pulsed Doppler interrogation of both testes demonstrates normal low resistance arterial and venous waveforms bilaterally.  IMPRESSION: Normal sonographic appearance of the bilateral testes.  No evidence of testicular torsion.  Small complex right hydrocele with debris.  Bilateral varicoceles.   Electronically Signed   By: Julian Hy M.D.   On: 09/04/2014 18:25   Dg Chest Port 1 View  09/04/2014   CLINICAL DATA:  Community acquired pneumonia. There degree heart block. Central line placement.  EXAM: PORTABLE CHEST - 1 VIEW  COMPARISON:  09/02/2014  FINDINGS: Mild cardiomegaly remains stable. Pacemaker remains in appropriate position. A new right jugular central venous catheter is seen with tip overlying the distal SVC. No evidence of pneumothorax.  Right lower lung airspace disease is again seen, without significant change allowing for differences in radiographic technique. Left lung remains clear. No definite pleural effusion seen on this portable exam.   IMPRESSION: New right internal jugular central venous catheter in appropriate position. No pneumothorax visualized.  Right lower lung airspace disease, without significant change.   Electronically Signed   By: Earle Gell M.D.   On: 09/04/2014 14:38    PHYSICAL EXAM General: NAD Neck: JVP difficult, ?8 cm. No thyromegaly or thyroid nodule.  Lungs: Crackles right base. CV: Nondisplaced PMI.  Heart regular S1/S2, no S3/S4, no murmur. Trace ankle edema.  No carotid bruit.  Normal pedal pulses.  Abdomen: Soft, nontender, no hepatosplenomegaly, no distention.  Neurologic: Alert and oriented x 3.  Psych: Normal affect. Extremities: No clubbing or cyanosis.   TELEMETRY: Reviewed telemetry pt in NSR with V-pacing  ASSESSMENT AND PLAN: 64 yo with h/o PPM for CHB, cardiomyopathy of uncertain etiology, and atrial tachycardia presented with RLL PNA, atrial flutter, and acute on chronic systolic CHF.  1. Acute on chronic systolic CHF: EF 83% on echo this admission, persistently low.  Has CHB and paces RV. Etiology of cardiomyopathy is not certain.  Possibly related to RV pacing.  However, we have not ruled out CAD => had Cardiolite in 6/15 with scar but no ischemia (?true infarct versus artifact). Exam difficult for volume.  He diuresed well yesterday.  No BMET yet today. Co-ox low yesterday while in atrial flutter.  Digoxin started and now back in NSR, co-ox up to 78%.  - Continue IV Lasix for now, but will check CVP today and may transition back to po if not high.  - Add Bidil 0.5 tab tid.  - Stop metoprolol 50 mg bid and will put him on Coreg 3.125 mg bid.  - Will need cardiac cath at some point, will need to decide on timing (not urgent).  He just converted out of atrial flutter and is on Xarelto.  Creatinine also has been elevated.  - Patient is going to need upgrade to CRT-D with RV pacing and persistently low EF.  Cannot rule out CMP due to RV pacing.  EP to see today, will need to decide on timing.    2. Atrial flutter: Patient has history of atrial tachycardia.  This admission, came in with atrial flutter.  He was started on amiodarone, now back in NSR.  Co-ox improved and he feels better.   - Continue IV amiodarone today, to po tomorrow.  - He  is on Xarelto.  - Think atrial flutter ablation would be reasonable consideration here.  Will discuss timing with EP.  3. Possible RLL PNA: Continue ceftriaxone/doxycycline.  4. CKD: Creatinine lower yesterday, needs BMET today.  5. 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   45 minutes critical care time  Loralie Champagne 09/05/2014 10:38 AM  CVP 1.  Stop Lasix, give back 250 cc NS.  Loralie Champagne 09/05/2014

## 2014-09-05 NOTE — Consult Note (Signed)
CARDIOLOGY CONSULT NOTE   Patient ID: Philip Chavez MRN: [id number], DOB/AGE: 10/06/1950   Admit date: 09/02/2014 Date of Consult: 09/05/2014   Primary Physician: Junie Panning, DO Primary Cardiologist: G. Lovena Le, MD   Pt. 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  We've been asked to eval for consideration of upgrade to a BiV ICD.  Problem List  Past Medical History  Diagnosis Date  . DVT (deep venous thrombosis)     a. 2007 RLE: S/P ankle surgery;  b. 03/2013 LLE DVT & PE-->Xarelto.  . Embolism, pulmonary with infarction     a. 2007 RLE: S/P ankle surgery;  b. 03/2013 LLE DVT & PE-->Xarelto.  . Third degree heart block     a. 03/2013 s/p MDT ADDRL1 Adapta DC PPM, ser # [id number] H.  . Hypercoagulable state   . Musculoskeletal neck pain   . Hypertension   . Presence of permanent cardiac pacemaker     a. 03/2013 s/p MDT ADDRL1 Adapta DC PPM, ser # [id number] H.  . Hypercholesterolemia   . Chronic combined systolic and diastolic CHF (congestive heart failure)     a. 03/2013 Echo EF 50-55%;  b. 07/2013 Echo EF 20-25%, diff HK, Gr3 DD;  c. 08/2014 Echo: EF 15%, diff HK, mild AI/MR, mildly dil LA/RV, mod TR, PASP 27mmHg.  . Sleep apnea     does not wear mask (09/02/2014)  . Pneumonia ~ 04/2014; 09/02/2014  . Nonischemic cardiomyopathy     a. 07/2013 Echo: EF 20-25%;  b. 07/2013 Myoview: Large inferior, lateral, apical scar w/ HK, no ischemia, EF 17%;  b. 08/2014 Echo: EF 15%, diff HK.  Marland Kitchen Atrial tachycardia   . Atrial flutter     a. 08/2014 presented w/ aflutter->converted on amio;  b. CHA2DS2VASc = 2 -->Xarelto.    Past Surgical History  Procedure Laterality Date  . Foot fracture surgery Right 2007  . Permanent pacemaker insertion N/A 03/15/2013    Procedure: PERMANENT PACEMAKER INSERTION;  Surgeon: Evans Lance, MD;  Location: [facility] CATH LAB;   Service: Cardiovascular;  Laterality: N/A;  . Inguinal hernia repair Right 1980's  . Insert / replace / remove pacemaker      MDT ADDRL1 pacemaker implanted by Dr Lovena Le for complete heart block  . Fracture surgery       Allergies  No Known Allergies  HPI   64 y/o male with the above complex past medical history.  In 03/2013, he was admitted to [facility] with chest pain and dyspnea and was diagnosed with an acute LLE DVT and also PE.  In that setting, he was noted to have complete heart block.  EF was nl. When heart block did not improve, he finally underwent successful MDT DC PPM placement.  During admission, he was noted to have ectopic atrial tachycardia and following PPM insertion, BB therapy was resumed.  In 07/2013, he was admitted to [facility] with CHF.  This was initially presumed to be diastolic in nature but echo showed and EF of 20-25% with grade III diastolic dysfxn.  A myoview was undertaken and was negative for ischemia but did show significant inferior, lateral, and apical scarring with associated wall motion abnormalities.  He was medically managed and has since followed up with Dr. Lovena Le.  Other than experiencing erectile dysfxn, potentially related to CHF meds, he had done reasonably well.  Upon his last visit  in 04/2014, recommendation was made for f/u echo in 10/2014 with consideration of upgrade to BiV @ that time if EF remained low.  Pt presented on 7/29 seoncondary to worsening dyspnea.  He was found to be in rate controlled atrial flutter and there was also question of RLL PNA.  He was placed on abx and amio along with IV lasix given mild volume overload.  Repeat echo has shown further reduction in EF to 15% w/ diffuse HK and pulmonary HTN (in setting of volume overload).  He was set up for TEE/DCCV today (missed xarelto prior to admission) but converted ot sinus rhythm @ 15:37 on 7/31.  Pt says that breathing has improved throughout admission.  Co-ox improved from 50.6 while in flutter  yesterday (specimen collection was 2 mins prior to converting to sinus) to 78 this AM.  His breathing has steadily improved throughout admission.  EP has been asked to see for consideration of RFCA and BiV ICD upgrade.  Inpatient Medications  . aspirin  324 mg Oral Once  . carvedilol  3.125 mg Oral BID WC  . cefTRIAXone (ROCEPHIN)  IV  1 g Intravenous Q24H  . digoxin  0.125 mg Oral Daily  . doxycycline  100 mg Oral Q12H  . isosorbide-hydrALAZINE  0.5 tablet Oral TID  . rivaroxaban  20 mg Oral Q supper    Family History Family History  Problem Relation Age of Onset  . Diabetes type II Mother   . Heart disease Father      Social History History   Social History  . Marital Status: Married    Spouse Name: N/A  . Number of Children: N/A  . Years of Education: N/A   Occupational History  . Not on file.   Social History Main Topics  . Smoking status: Former Smoker -- 1.50 packs/day for 10 years    Types: Cigarettes  . Smokeless tobacco: Never Used     Comment: 09/02/2014  "quit smoking 20-30 yr ago"  . Alcohol Use: No  . Drug Use: No  . Sexual Activity: Yes   Other Topics Concern  . Not on file   Social History Narrative     Review of Systems  General:  No chills, fever, night sweats or weight changes.  Cardiovascular:  No chest pain, +++ dyspnea on exertion, +++ edema, +++ orthopnea, +++ palpitations, no paroxysmal nocturnal dyspnea. Dermatological: No rash, lesions/masses Respiratory: No cough, dyspnea Urologic: No hematuria, dysuria Abdominal:   No nausea, vomiting, diarrhea, bright red blood per rectum, melena, or hematemesis Neurologic:  No visual changes, wkns, changes in mental status. All other systems reviewed and are otherwise negative except as noted above.  Physical Exam  Blood pressure 113/69, pulse 78, temperature 97.5 F (36.4 C), temperature source Oral, resp. rate 25, height 6\' 1"  (1.854 m), weight 198 lb 14.4 oz (90.22 kg), SpO2 97 %.  General:  Pleasant, NAD Psych: Normal affect. Neuro: Alert and oriented X 3. Moves all extremities spontaneously. HEENT: Normal  Neck: Supple without bruits.  JVD difficult to assess in setting of IJ line. Lungs:  Resp regular and unlabored, CTA. Heart: RRR no s3, s4, or murmurs. Abdomen: Soft, non-tender, non-distended, BS + x 4.  Extremities: No clubbing, cyanosis or edema. DP/PT/Radials 2+ and equal bilaterally.  Labs   Recent Labs  09/02/14 1711 09/03/14 0104 09/03/14 0336 09/04/14 1655  CKTOTAL  --   --   --  323  TROPONINI 0.17* 0.10* 0.08*  --    Lab  Results  Component Value Date   WBC 13.1* 09/05/2014   HGB 16.4 09/05/2014   HCT 47.6 09/05/2014   MCV 96.9 09/05/2014   PLT 203 09/05/2014    Recent Labs Lab 09/05/14 0940  NA 134*  134*  K 4.0  4.0  CL 96*  96*  CO2 29  28  BUN 34*  34*  CREATININE 1.48*  1.36*  CALCIUM 8.6*  8.7*  PROT 6.6  BILITOT 1.5*  ALKPHOS 123  ALT 196*  AST 84*  GLUCOSE 287*  291*   Lab Results  Component Value Date   CHOL 101 09/03/2014   HDL 21* 09/03/2014   LDLCALC 67 09/03/2014   TRIG 65 09/03/2014   Lab Results  Component Value Date   DDIMER 0.22 02/03/2007    Radiology/Studies  Dg Chest 2 View  09/02/2014   CLINICAL DATA:  Arrhythmia and hiccups for 3 days.  EXAM: CHEST  2 VIEW  COMPARISON:  04/12/2014 and prior radiographs  FINDINGS: Cardiomegaly and left sided pacemaker again noted.  Right lower lobe airspace disease is compatible with pneumonia.  There is no evidence of pulmonary edema, suspicious pulmonary nodule/mass, pleural effusion, or pneumothorax. No acute bony abnormalities are identified.  IMPRESSION: Right lower lobe airspace disease compatible with pneumonia. Radiographic follow-up to resolution is recommended.  Cardiomegaly.   Electronically Signed   By: Margarette Canada M.D.   On: 09/02/2014 18:54   Ct Head Wo Contrast  09/02/2014   CLINICAL DATA:  Somnolence  EXAM: CT HEAD WITHOUT CONTRAST  TECHNIQUE:  Contiguous axial images were obtained from the base of the skull through the vertex without intravenous contrast.  COMPARISON:  None.  FINDINGS: There is encephalomalacia due to remote infarction in the right caudate and anterior basal ganglia. There is no intracranial hemorrhage, mass or evidence of acute infarction. Gray-white differentiation is preserved. There is mild generalized atrophy. There is no bony abnormality. Visible paranasal sinuses are clear.  IMPRESSION: Remote right caudate and basal ganglia infarction. Mild generalized atrophy. No acute findings.   Electronically Signed   By: Andreas Newport M.D.   On: 09/02/2014 23:27   US Scrotum  09/04/2014   CLINICAL DATA:  Right testicular pain, status post inguinal hernia repair  EXAM: SCROTAL ULTRASOUND  DOPPLER ULTRASOUND OF THE TESTICLES  TECHNIQUE: Complete ultrasound examination of the testicles, epididymis, and other scrotal structures was performed. Color and spectral Doppler ultrasound were also utilized to evaluate blood flow to the testicles.  COMPARISON:  None.  FINDINGS: Right testicle  Measurements: 4.2 x 2.7 x 3.2 cm. No mass or microlithiasis visualized.  Left testicle  Measurements: 4.2 x 2.5 x 3.1 cm. No mass or microlithiasis visualized.  Right epididymis:  Normal in size and appearance.  Left epididymis:  Normal in size and appearance.  Hydrocele: Small complex right hydrocele with debris. Physiologic fluid on the left.  Varicocele:  Present bilaterally.  Pulsed Doppler interrogation of both testes demonstrates normal low resistance arterial and venous waveforms bilaterally.  IMPRESSION: Normal sonographic appearance of the bilateral testes.  No evidence of testicular torsion.  Small complex right hydrocele with debris.  Bilateral varicoceles.   Electronically Signed   By: Julian Hy M.D.   On: 09/04/2014 18:25   [place] Art/ven Flow Abd Pelv Doppler  09/04/2014   CLINICAL DATA:  Right testicular pain, status post inguinal hernia  repair  EXAM: SCROTAL ULTRASOUND  DOPPLER ULTRASOUND OF THE TESTICLES  TECHNIQUE: Complete ultrasound examination of the testicles, epididymis, and other scrotal  structures was performed. Color and spectral Doppler ultrasound were also utilized to evaluate blood flow to the testicles.  COMPARISON:  None.  FINDINGS: Right testicle  Measurements: 4.2 x 2.7 x 3.2 cm. No mass or microlithiasis visualized.  Left testicle  Measurements: 4.2 x 2.5 x 3.1 cm. No mass or microlithiasis visualized.  Right epididymis:  Normal in size and appearance.  Left epididymis:  Normal in size and appearance.  Hydrocele: Small complex right hydrocele with debris. Physiologic fluid on the left.  Varicocele:  Present bilaterally.  Pulsed Doppler interrogation of both testes demonstrates normal low resistance arterial and venous waveforms bilaterally.  IMPRESSION: Normal sonographic appearance of the bilateral testes.  No evidence of testicular torsion.  Small complex right hydrocele with debris.  Bilateral varicoceles.   Electronically Signed   By: Julian Hy M.D.   On: 09/04/2014 18:25   Dg Chest Port 1 View  09/04/2014   CLINICAL DATA:  Community acquired pneumonia. There degree heart block. Central line placement.  EXAM: PORTABLE CHEST - 1 VIEW  COMPARISON:  09/02/2014  FINDINGS: Mild cardiomegaly remains stable. Pacemaker remains in appropriate position. A new right jugular central venous catheter is seen with tip overlying the distal SVC. No evidence of pneumothorax.  Right lower lung airspace disease is again seen, without significant change allowing for differences in radiographic technique. Left lung remains clear. No definite pleural effusion seen on this portable exam.  IMPRESSION: New right internal jugular central venous catheter in appropriate position. No pneumothorax visualized.  Right lower lung airspace disease, without significant change.   Electronically Signed   By: Earle Gell M.D.   On: 09/04/2014 14:38    ECG  A sensed, V paced, 79, atrial enlargement.  ASSESSMENT AND PLAN  1.  Paroxysmal Atrial Flutter:  Pt presented with progressive dyspnea, RLL PNA, mild volume overload, and rate-controlled atrial flutter.  He was treated with amio over the weekend and has now converted to sinus rhythm (7/31 - 15:37).  Breathing and co-ox improved since conversion.  Appears euvolemic on exam this AM.  CHA2DS2VASc = 2 and he has been on chronic xarelto in the setting of LLE DVT and PE in 03/2013.  Last device interrogation in 07/2013 showed 831 mode switches, accounting for 0.3% of the time.  He has otw been V paced.  Interrogation today shows onset of aflutter on 7/28.  Will d/w Dr. Caryl Comes re: role of rfca vs antiarrhythmic therapy.  2.  NICM/chronic combined systolic/diastolic CHF:  EF 66% w/ diff HK by echo this admission.  Diuresed and feeling better.  Remains on bb, hydral/nitrates.  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  He reports compliance with meds @ home and despite this, EF has continued to worsen.  At this point, he is a candidate for upgrade to ICD therapy and in the setting of 99% V pacing, which may be contributing to LV dysfxn, upgrade to a Bi-V device is prudent.    3.  DVT/PE:  Chronic Xarelto.  4.Complete heart block  5. Pacemaker  [employer]   Signed, Murray Hodgkins, NP 09/05/2014, 11:41 AM   Pt has signifivant worsening ofg LV function since pacemaker insertion 2/15   Interval myoview has demonstrated muyltiple persufsion defects and cath has been discussed but not yet done  Hospitalization was precipitated by Atrial flutter with controlled rates (CHB) which has been a relatively infrequent occurrence and will need addressing although for now  He is in sinus and feeling better  The LV dysfunction differential includes ischemic diseaese as well as being a consequence of his RV apical pacing  Hence  Would  recommend 1) outpt cath in about 4 weeks following restoration of sinus with as brief a hold of anticoagulation as possible 2) if not disease in need of revascularization is found, would then proceed withCRT upgrade 3) Flutter ablation can be undertaken at any time 4) stop ASA 5) try low dose ACE adjunctive  Will review w GT primary EP   Thanks fro consult

## 2014-09-06 ENCOUNTER — Encounter: Payer: BC Managed Care – PPO | Admitting: Internal Medicine

## 2014-09-06 LAB — CBC
HCT: 42.3 % (ref 39.0–52.0)
Hemoglobin: 14.3 g/dL (ref 13.0–17.0)
MCH: 32.4 pg (ref 26.0–34.0)
MCHC: 33.8 g/dL (ref 30.0–36.0)
MCV: 95.7 fL (ref 78.0–100.0)
PLATELETS: 210 10*3/uL (ref 150–400)
RBC: 4.42 MIL/uL (ref 4.22–5.81)
RDW: 14.2 % (ref 11.5–15.5)
WBC: 11.1 10*3/uL — ABNORMAL HIGH (ref 4.0–10.5)

## 2014-09-06 LAB — BASIC METABOLIC PANEL
Anion gap: 7 (ref 5–15)
BUN: 29 mg/dL — AB (ref 6–20)
CO2: 31 mmol/L (ref 22–32)
Calcium: 7.8 mg/dL — ABNORMAL LOW (ref 8.9–10.3)
Chloride: 97 mmol/L — ABNORMAL LOW (ref 101–111)
Creatinine, Ser: 1.2 mg/dL (ref 0.61–1.24)
GFR calc Af Amer: >60 mL/min (ref >60)
GFR calc non Af Amer: >60 mL/min (ref >60)
Glucose, Bld: 149 mg/dL — ABNORMAL HIGH (ref 65–99)
POTASSIUM: 4.1 mmol/L (ref 3.5–5.1)
Sodium: 135 mmol/L (ref 135–145)

## 2014-09-06 LAB — HEPATIC FUNCTION PANEL
ALT: 163 U/L — AB (ref 17–63)
AST: 76 U/L — ABNORMAL HIGH (ref 15–41)
Albumin: 2.3 g/dL — ABNORMAL LOW (ref 3.5–5.0)
Alkaline Phosphatase: 105 U/L (ref 38–126)
BILIRUBIN INDIRECT: 1 mg/dL — AB (ref 0.3–0.9)
Bilirubin, Direct: 0.4 mg/dL (ref 0.1–0.5)
TOTAL PROTEIN: 5.5 g/dL — AB (ref 6.5–8.1)
Total Bilirubin: 1.4 mg/dL — ABNORMAL HIGH (ref 0.3–1.2)

## 2014-09-06 LAB — CARBOXYHEMOGLOBIN
Carboxyhemoglobin: 1.3 % (ref 0.5–1.5)
METHEMOGLOBIN: 1.1 % (ref 0.0–1.5)
O2 Saturation: 58.5 %
Total hemoglobin: 14.1 g/dL (ref 13.5–18.0)

## 2014-09-06 MED ORDER — CHLORPROMAZINE HCL 25 MG PO TABS
25.0000 mg | ORAL_TABLET | Freq: Three times a day (TID) | ORAL | Status: DC
  Administered 2014-09-06 – 2014-09-08 (×3): 25 mg via ORAL
  Filled 2014-09-06 (×9): qty 1

## 2014-09-06 MED ORDER — FUROSEMIDE 40 MG PO TABS
40.0000 mg | ORAL_TABLET | Freq: Every day | ORAL | Status: DC
Start: 2014-09-06 — End: 2014-09-06
  Administered 2014-09-06: 40 mg via ORAL
  Filled 2014-09-06: qty 1

## 2014-09-06 MED ORDER — CEFDINIR 125 MG/5ML PO SUSR: 300.0000 mg | Freq: Every day | ORAL | Status: DC

## 2014-09-06 MED ORDER — FUROSEMIDE 40 MG PO TABS: 40.0000 mg | ORAL_TABLET | Freq: Every day | ORAL | Status: DC

## 2014-09-06 MED ORDER — CEFUROXIME AXETIL 500 MG PO TABS
500.0000 mg | ORAL_TABLET | Freq: Two times a day (BID) | ORAL | Status: DC
  Administered 2014-09-06 – 2014-09-08 (×4): 500 mg via ORAL
  Filled 2014-09-06 (×7): qty 1

## 2014-09-06 MED ORDER — CEFUROXIME AXETIL 500 MG PO TABS
500.0000 mg | ORAL_TABLET | Freq: Two times a day (BID) | ORAL | Status: DC
  Filled 2014-09-06 (×2): qty 1

## 2014-09-06 MED ORDER — FUROSEMIDE 20 MG PO TABS: 20.0000 mg | ORAL_TABLET | Freq: Every evening | ORAL | Status: DC

## 2014-09-06 MED ORDER — AMIODARONE HCL 200 MG PO TABS: 200.0000 mg | ORAL_TABLET | Freq: Two times a day (BID) | ORAL | Status: DC

## 2014-09-06 NOTE — Clinical Documentation Improvement (Signed)
09/04/14 progr note.Marland KitchenMarland Kitchen"He has acute on chronic kidney disease..."..Marland Kitchen 09/06/14 progr note..."4. CKD: Creatinine lower.".Marland KitchenMarland Kitchen  Ref. Range 09/02/2014 17:11 09/03/2014 12:33 09/04/2014 03:24 09/04/2014 16:55 09/05/2014 09:40 09/06/2014 04:50  Creatinine Latest Ref Range: 0.61-1.24 mg/dL 1.74 (H) 1.60 (H) 1.78 (H) 1.55 (H) 1.48 (H) 1.20    Ref. Range 09/02/2014 17:11 09/03/2014 12:33 09/04/2014 03:24 09/04/2014 16:55 09/05/2014 09:40  EGFR (African American) Latest Ref Range: >60 mL/min 46 (L) 51 (L) 45 (L) 53 (L) 56 (L)  For accurate Dx specificity & severity can noted "CKD" be further specified w/ stage. Thank you Possible Clinical Conditions?   _______CKD Stage I - GFR > OR = 90 _______CKD Stage II - GFR 60-80 _______CKD Stage III - GFR 30-59 _______CKD Stage IV - GFR 15-29 _______CKD Stage V - GFR < 15 _______ESRD (End Stage Renal Disease) _______Other condition_____________ _______Cannot Clinically determine   Supporting Information: see above  Thank You, Ermelinda Das, RN, BSN, CCDS Certified Clinical Documentation Specialist Pager: [place]: Health Information Management

## 2014-09-06 NOTE — Progress Notes (Addendum)
Family Medicine Teaching Service Daily Progress Note Intern Pager: [phone]  Patient name: Philip Chavez Medical record number: [id number] Date of birth: [birth year] Age: 64 y.o. Gender: male  Primary Care Provider: Junie Panning, DO Consultants: Cardiology and EP Code Status: Full   Pt Overview and Major Events to Date:  7/29: presented with hiccups (on chlopromazine)  7/30: EF 15% 8/1 Atrial Flutter Resolved will stop  8/2 Hyponatremia on admission Na 129, now resolved  8/2 AKI Cr 1.74 - >Cr 1.2, normalized  Assessment and Plan: Philip Chavez is a 64 y.o. male presenting with chest pain. 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  CHF: EF 20% on echo in 07/2013. Repeat echo this admission with an EF 15% and pulmonary HTN at 20. Takes Lasix 40 mg, lisinopril, lopressor simvastatin, and spironolactone at home, changed by cards.BNP 1598.9 and CK 323  - Hold home meds  - Lisinopril 2.5 mg qd, Digoxin 0.125 mg QD, Bidil 0.5 tablet TID, Coreg 3.125 mg QD - Will restart po Lasix this evening, would use a little higher dose for home compared to prior, 40 qam/20 qpm.  - Continue to hold spiro, restart at dispo - monitor I&Os: Net -1,655.9  - weights : 197 lbs > 200 lb, will continue to monitor - outpt cath in about 4 weeks following restoration of sinus with as brief a hold of anticoagulation as possible, future CRT upgrade   Resolved Atrial flutter with CVR: No longer in Atrial flutter. Cardiology following. No Ablation while here, likely with CRT.  - Stopped ASA - Continue Telemetry  - amiodarone gtt - will transfer to PO today  Pneumonia, CAP: Received one dose of CTX and azithro in ED. Endorsed some SOB before presentation. CXR showed R lower lobe PNA with stable cardiomegaly. WBC slightly increased at 12.3 trending with WBC 11.8 on 7/31. Negative strep pneumo. Legionella urine negative. - Transition to PO Cefuroxime  500mg  (day 4 of 7)   - Blood cultures NGTD x 2 days, Gram stain negative    Resolved Penial Pain:  U/S was performed yesterday significant only for small complex right hydrocele with debris and biilateral varicoceles. No testicular torsion noted with normal doppler flow.  Epididymis consider. UA negative for leukocytes, nitrates. Urine Gram stain negative. Urine culture currently pending. G/C negative  - Urine Culture x NG x1 day - D/C doxycycline   Transaminitis: Trending down. Likely due to severe hepatic congestion from cor pulmonale as patient's EF is 15%.  ALT 172>216>205> 163, AST 124>127>125>76 with a Tbili of 2.2>1.4. Hepatitis A, B and C negative.  - continue to follow hepatic function  - holding simvastatin   Unwitnessed fall: patient's wife reports the patient fell the day PTA but she did not witness the fall and does not know if the patient hit his head - Given that patient is on Xarelto, obtained CT head that revealed R caudate and basal ganglia infarct but no acute findings  - consider stroke work up in the outpatient setting.    HTN:   - Pt relatively hypotensive with systolics in low 791T  - Hold home spiro  History DVT/PE: on Xarelto at home - Continue home Xarelto  FEN/GI: IV saline lock; heart healthy diet Prophylaxis: continue home Xarelto  Disposition: Floor status, discharge pending clinical improvement   Subjective:  Patient is doing much better this morning.     Objective: Temp:  [97.5 F (36.4 C)-97.8 F (36.6 C)] 97.5  F (36.4 C) (08/02 0500) Pulse Rate:  [65-78] 68 (08/02 0500) Resp:  [11-25] 11 (08/02 0500) BP: (99-113)/(67-76) 103/74 mmHg (08/02 0500) SpO2:  [96 %-98 %] 96 % (08/02 0500) Weight:  [197 lb 9.6 oz (89.631 kg)] 197 lb 9.6 oz (89.631 kg) (08/02 0500) Physical Exam: General: sitting up in bed in NAD. Non-toxic Eyes: Conjunctivae non-injected with Scelra Icterus  ENTM: Moist mucous membranes. Oropharynx clear. No nasal discharge.   Neck: Supple, no LAD  Cardiovascular: RRR. No murmurs, rubs, or gallops noted. Radial and DP pulses bounding No pitting edema noted. Respiratory: No increased WOB. CTAB without wheezing, rhonchi, or crackles noted. Abdomen: +BS, soft, non-distended, non-tender Gential: No swelling of scrotum, no discharge from penis or bumps noted.   Laboratory:  Recent Labs Lab 09/04/14 1030 09/05/14 0940 09/06/14 0450  WBC 11.5* 13.1* 11.1*  HGB 14.9 16.4 14.3  HCT 43.7 47.6 42.3  PLT 220 203 210    Recent Labs Lab 09/04/14 0324 09/04/14 1655 09/05/14 0940 09/06/14 0450  NA 129* 129* 134*  134* 135  K 5.4* 5.1 4.0  4.0 4.1  CL 94* 94* 96*  96* 97*  CO2 24 26 29  28 31   BUN 45* 39* 34*  34* 29*  CREATININE 1.78* 1.55* 1.48*  1.36* 1.20  CALCIUM 8.3* 8.6* 8.6*  8.7* 7.8*  PROT 6.1*  --  6.6 5.5*  BILITOT 1.4*  --  1.5* 1.4*  ALKPHOS 126  --  123 105  ALT 205*  --  196* 163*  AST 125*  --  84* 76*  GLUCOSE 232* 264* 287*  291* 149*      Maylon Sailors Cletis Media, MD 09/06/2014, 8:13 AM PGY-1, [place] Intern pager: [phone], text pages welcome

## 2014-09-06 NOTE — Progress Notes (Signed)
Patient ID: Philip Chavez, male   DOB: [birth year], 64 y.o.   MRN: [id number]    Patient Name: Philip Chavez Date of Encounter: 09/06/2014     Active Problems:   History of DVT (deep vein thrombosis)   Hx of pulmonary embolus   Third degree heart block   Pacemaker   Atrial tachycardia   CAP (community acquired pneumonia)   Chronic combined systolic and diastolic CHF (congestive heart failure)   Dyslipidemia   Sleeping difficulty   Anxiety   Chest pain   Acute on chronic combined systolic and diastolic congestive heart failure, NYHA class 4   Atrial flutter, unspecified   Nonischemic cardiomyopathy   Testicular pain    SUBJECTIVE  C/o hiccups. Dyspnea improved.   CURRENT MEDS . carvedilol  3.125 mg Oral BID WC  . cefTRIAXone (ROCEPHIN)  IV  1 g Intravenous Q24H  . chlorproMAZINE  25 mg Oral TID  . digoxin  0.125 mg Oral Daily  . doxycycline  100 mg Oral Q12H  . furosemide  20 mg Oral QPM  . furosemide  40 mg Oral Daily  . isosorbide-hydrALAZINE  0.5 tablet Oral TID  . lisinopril  2.5 mg Oral QHS  . rivaroxaban  20 mg Oral Q supper    OBJECTIVE  Filed Vitals:   09/06/14 0010 09/06/14 0210 09/06/14 0450 09/06/14 0500  BP: 105/69 104/71 106/76 103/74  Pulse:   65 68  Temp:    97.5 F (36.4 C)  TempSrc:      Resp: 21 21 13 11   Height:      Weight:    197 lb 9.6 oz (89.631 kg)  SpO2:   97% 96%    Intake/Output Summary (Last 24 hours) at 09/06/14 0858 Last data filed at 09/06/14 0400  Gross per 24 hour  Intake   1054 ml  Output   2310 ml  Net  -1256 ml   Filed Weights   09/04/14 0400 09/05/14 0500 09/06/14 0500  Weight: 200 lb 1.6 oz (90.765 kg) 198 lb 14.4 oz (90.22 kg) 197 lb 9.6 oz (89.631 kg)    PHYSICAL EXAM  General: Pleasant, NAD. Neuro: Alert and oriented X 3. Moves all extremities spontaneously. Psych: Normal affect. HEENT:  Normal  Neck: Supple without bruits or JVD. Lungs:  Resp regular and unlabored, CTA. Heart: RRR with split S2, no  murmurs, PMI is enlarged Abdomen: Soft, non-tender, non-distended, BS + x 4.  Extremities: No clubbing, cyanosis or edema. DP/PT/Radials 2+ and equal bilaterally.  Accessory Clinical Findings  CBC  Recent Labs  09/05/14 0940 09/06/14 0450  WBC 13.1* 11.1*  HGB 16.4 14.3  HCT 47.6 42.3  MCV 96.9 95.7  PLT 203 650   Basic Metabolic Panel  Recent Labs  09/05/14 0940 09/06/14 0450  NA 134*  134* 135  K 4.0  4.0 4.1  CL 96*  96* 97*  CO2 29  28 31   GLUCOSE 287*  291* 149*  BUN 34*  34* 29*  CREATININE 1.48*  1.36* 1.20  CALCIUM 8.6*  8.7* 7.8*   Liver Function Tests  Recent Labs  09/05/14 0940 09/06/14 0450  AST 84* 76*  ALT 196* 163*  ALKPHOS 123 105  BILITOT 1.5* 1.4*  PROT 6.6 5.5*  ALBUMIN 2.8* 2.3*   No results for input(s): LIPASE, AMYLASE in the last 72 hours. Cardiac Enzymes  Recent Labs  09/04/14 1655  CKTOTAL 323   BNP Invalid input(s): POCBNP D-Dimer No results for input(s): DDIMER in the last  72 hours. Hemoglobin A1C  Recent Labs  09/03/14 1233  HGBA1C 6.7*   Fasting Lipid Panel  Recent Labs  09/03/14 1233  CHOL 101  HDL 21*  LDLCALC 67  TRIG 65  CHOLHDL 4.8   Thyroid Function Tests No results for input(s): TSH, T4TOTAL, T3FREE, THYROIDAB in the last 72 hours.  Invalid input(s): FREET3  TELE  NSR with ventricular pacing  Radiology/Studies  Dg Chest 2 View  09/02/2014   CLINICAL DATA:  Arrhythmia and hiccups for 3 days.  EXAM: CHEST  2 VIEW  COMPARISON:  04/12/2014 and prior radiographs  FINDINGS: Cardiomegaly and left sided pacemaker again noted.  Right lower lobe airspace disease is compatible with pneumonia.  There is no evidence of pulmonary edema, suspicious pulmonary nodule/mass, pleural effusion, or pneumothorax. No acute bony abnormalities are identified.  IMPRESSION: Right lower lobe airspace disease compatible with pneumonia. Radiographic follow-up to resolution is recommended.  Cardiomegaly.    Electronically Signed   By: Margarette Canada M.D.   On: 09/02/2014 18:54   Ct Head Wo Contrast  09/02/2014   CLINICAL DATA:  Somnolence  EXAM: CT HEAD WITHOUT CONTRAST  TECHNIQUE: Contiguous axial images were obtained from the base of the skull through the vertex without intravenous contrast.  COMPARISON:  None.  FINDINGS: There is encephalomalacia due to remote infarction in the right caudate and anterior basal ganglia. There is no intracranial hemorrhage, mass or evidence of acute infarction. Gray-white differentiation is preserved. There is mild generalized atrophy. There is no bony abnormality. Visible paranasal sinuses are clear.  IMPRESSION: Remote right caudate and basal ganglia infarction. Mild generalized atrophy. No acute findings.   Electronically Signed   By: Andreas Newport M.D.   On: 09/02/2014 23:27   US Scrotum  09/04/2014   CLINICAL DATA:  Right testicular pain, status post inguinal hernia repair  EXAM: SCROTAL ULTRASOUND  DOPPLER ULTRASOUND OF THE TESTICLES  TECHNIQUE: Complete ultrasound examination of the testicles, epididymis, and other scrotal structures was performed. Color and spectral Doppler ultrasound were also utilized to evaluate blood flow to the testicles.  COMPARISON:  None.  FINDINGS: Right testicle  Measurements: 4.2 x 2.7 x 3.2 cm. No mass or microlithiasis visualized.  Left testicle  Measurements: 4.2 x 2.5 x 3.1 cm. No mass or microlithiasis visualized.  Right epididymis:  Normal in size and appearance.  Left epididymis:  Normal in size and appearance.  Hydrocele: Small complex right hydrocele with debris. Physiologic fluid on the left.  Varicocele:  Present bilaterally.  Pulsed Doppler interrogation of both testes demonstrates normal low resistance arterial and venous waveforms bilaterally.  IMPRESSION: Normal sonographic appearance of the bilateral testes.  No evidence of testicular torsion.  Small complex right hydrocele with debris.  Bilateral varicoceles.   Electronically  Signed   By: Julian Hy M.D.   On: 09/04/2014 18:25   [place] Art/ven Flow Abd Pelv Doppler  09/04/2014   CLINICAL DATA:  Right testicular pain, status post inguinal hernia repair  EXAM: SCROTAL ULTRASOUND  DOPPLER ULTRASOUND OF THE TESTICLES  TECHNIQUE: Complete ultrasound examination of the testicles, epididymis, and other scrotal structures was performed. Color and spectral Doppler ultrasound were also utilized to evaluate blood flow to the testicles.  COMPARISON:  None.  FINDINGS: Right testicle  Measurements: 4.2 x 2.7 x 3.2 cm. No mass or microlithiasis visualized.  Left testicle  Measurements: 4.2 x 2.5 x 3.1 cm. No mass or microlithiasis visualized.  Right epididymis:  Normal in size and appearance.  Left epididymis:  Normal in size and appearance.  Hydrocele: Small complex right hydrocele with debris. Physiologic fluid on the left.  Varicocele:  Present bilaterally.  Pulsed Doppler interrogation of both testes demonstrates normal low resistance arterial and venous waveforms bilaterally.  IMPRESSION: Normal sonographic appearance of the bilateral testes.  No evidence of testicular torsion.  Small complex right hydrocele with debris.  Bilateral varicoceles.   Electronically Signed   By: Julian Hy M.D.   On: 09/04/2014 18:25   Dg Chest Port 1 View  09/04/2014   CLINICAL DATA:  Community acquired pneumonia. There degree heart block. Central line placement.  EXAM: PORTABLE CHEST - 1 VIEW  COMPARISON:  09/02/2014  FINDINGS: Mild cardiomegaly remains stable. Pacemaker remains in appropriate position. A new right jugular central venous catheter is seen with tip overlying the distal SVC. No evidence of pneumothorax.  Right lower lung airspace disease is again seen, without significant change allowing for differences in radiographic technique. Left lung remains clear. No definite pleural effusion seen on this portable exam.  IMPRESSION: New right internal jugular central venous catheter in appropriate  position. No pneumothorax visualized.  Right lower lung airspace disease, without significant change.   Electronically Signed   By: Earle Gell M.D.   On: 09/04/2014 14:38    ASSESSMENT AND PLAN  1. Acute on chronic systolic heart failure 2. Hiccups 3. Community acquired pneumonia 4. Complete heart block, s/p PPM 5. Acute atrial flutter, now back to NSR on amiodarone Rec: reviewed Dr. Donalynn Furlong note. He wants to wait on cardiac cath. Will plan flutter ablation tomorrow as per Dr. Angelina Pih note. We will stop amio. Use thorazine for hiccups which have been intractable for over a week, continue antibiotics for pneumonia. I would anticipate upgrade to a BiV ICD in 3-4 weeks.We will arrange this. Should be stable for discharge on 8/4.  Tamyrah Burbage,M.D.  Vasil Juhasz,M.D.  09/06/2014 8:58 AM

## 2014-09-06 NOTE — Progress Notes (Addendum)
Patient ID: Philip Chavez, male   DOB: 12-07-1950, 64 y.o.   MRN: [id number]   64 yo with history of CHB s/p MDT PPM.  Echo in 2/15 showed EF 50-55% when pacemaker was placed.  However, EF had fallen to 20-25% by echo in 6/15.  Cardiolite at that time showed scar but no ischemia.  Patient additionally had a PE in 2/15.  Plan per Dr Lovena Le had been repeat echo with BiV upgrade if EF remained low. He also has a prior history of atrial tachycardia (paroxysmal).   Patient was admitted on 7/30 with dyspnea.  He was found to be in atrial flutter with controlled rate (new) and with possible RLL PNA.  Abx were started and he was put on amiodarone.  He has been on Xarelto.  He was thought to be volume overloaded but diuresed poorly initially with Lasix 40 mg IV bid.  Increasing Lasix to 80 mg IV bid improved diuresis.   Echo this admission with EF 15%, diffuse hypokinesis, RV mildly dilated with normal systolic function, PA systolic pressure 69 mmHg.   Today, he remains in NSR on amiodarone gtt.  CVP 4-7, IV Lasix stopped yesterday.  Co-ox a little lower today at 59%.  Breathing stable, creatinine down to 1.2.   ECG: NSR with V-pacing  Scheduled Meds: . amiodarone  200 mg Oral BID  . carvedilol  3.125 mg Oral BID WC  . cefTRIAXone (ROCEPHIN)  IV  1 g Intravenous Q24H  . chlorproMAZINE  25 mg Oral TID  . digoxin  0.125 mg Oral Daily  . doxycycline  100 mg Oral Q12H  . furosemide  20 mg Oral QPM  . [START ON 09/07/2014] furosemide  40 mg Oral Daily  . isosorbide-hydrALAZINE  0.5 tablet Oral TID  . lisinopril  2.5 mg Oral QHS  . rivaroxaban  20 mg Oral Q supper   Continuous Infusions:   PRN Meds:.sodium chloride   Filed Vitals:   09/06/14 0010 09/06/14 0210 09/06/14 0450 09/06/14 0500  BP: 105/69 104/71 106/76 103/74  Pulse:   65 68  Temp:    97.5 F (36.4 C)  TempSrc:      Resp: 21 21 13 11   Height:      Weight:    197 lb 9.6 oz (89.631 kg)  SpO2:   97% 96%    Intake/Output Summary  (Last 24 hours) at 09/06/14 0844 Last data filed at 09/06/14 0400  Gross per 24 hour  Intake   1054 ml  Output   2310 ml  Net  -1256 ml    LABS: Basic Metabolic Panel:  Recent Labs  09/05/14 0940 09/06/14 0450  NA 134*  134* 135  K 4.0  4.0 4.1  CL 96*  96* 97*  CO2 29  28 31   GLUCOSE 287*  291* 149*  BUN 34*  34* 29*  CREATININE 1.48*  1.36* 1.20  CALCIUM 8.6*  8.7* 7.8*   Liver Function Tests:  Recent Labs  09/05/14 0940 09/06/14 0450  AST 84* 76*  ALT 196* 163*  ALKPHOS 123 105  BILITOT 1.5* 1.4*  PROT 6.6 5.5*  ALBUMIN 2.8* 2.3*   No results for input(s): LIPASE, AMYLASE in the last 72 hours. CBC:  Recent Labs  09/05/14 0940 09/06/14 0450  WBC 13.1* 11.1*  HGB 16.4 14.3  HCT 47.6 42.3  MCV 96.9 95.7  PLT 203 210   Cardiac Enzymes:  Recent Labs  09/04/14 1655  CKTOTAL 323   BNP: Invalid input(s):  POCBNP D-Dimer: No results for input(s): DDIMER in the last 72 hours. Hemoglobin A1C:  Recent Labs  09/03/14 1233  HGBA1C 6.7*   Fasting Lipid Panel:  Recent Labs  09/03/14 1233  CHOL 101  HDL 21*  LDLCALC 67  TRIG 65  CHOLHDL 4.8   Thyroid Function Tests: No results for input(s): TSH, T4TOTAL, T3FREE, THYROIDAB in the last 72 hours.  Invalid input(s): FREET3 Anemia Panel: No results for input(s): VITAMINB12, FOLATE, FERRITIN, TIBC, IRON, RETICCTPCT in the last 72 hours.  RADIOLOGY: Dg Chest 2 View  09/02/2014   CLINICAL DATA:  Arrhythmia and hiccups for 3 days.  EXAM: CHEST  2 VIEW  COMPARISON:  04/12/2014 and prior radiographs  FINDINGS: Cardiomegaly and left sided pacemaker again noted.  Right lower lobe airspace disease is compatible with pneumonia.  There is no evidence of pulmonary edema, suspicious pulmonary nodule/mass, pleural effusion, or pneumothorax. No acute bony abnormalities are identified.  IMPRESSION: Right lower lobe airspace disease compatible with pneumonia. Radiographic follow-up to resolution is  recommended.  Cardiomegaly.   Electronically Signed   By: Margarette Canada M.D.   On: 09/02/2014 18:54   Ct Head Wo Contrast  09/02/2014   CLINICAL DATA:  Somnolence  EXAM: CT HEAD WITHOUT CONTRAST  TECHNIQUE: Contiguous axial images were obtained from the base of the skull through the vertex without intravenous contrast.  COMPARISON:  None.  FINDINGS: There is encephalomalacia due to remote infarction in the right caudate and anterior basal ganglia. There is no intracranial hemorrhage, mass or evidence of acute infarction. Gray-white differentiation is preserved. There is mild generalized atrophy. There is no bony abnormality. Visible paranasal sinuses are clear.  IMPRESSION: Remote right caudate and basal ganglia infarction. Mild generalized atrophy. No acute findings.   Electronically Signed   By: Andreas Newport M.D.   On: 09/02/2014 23:27   US Scrotum  09/04/2014   CLINICAL DATA:  Right testicular pain, status post inguinal hernia repair  EXAM: SCROTAL ULTRASOUND  DOPPLER ULTRASOUND OF THE TESTICLES  TECHNIQUE: Complete ultrasound examination of the testicles, epididymis, and other scrotal structures was performed. Color and spectral Doppler ultrasound were also utilized to evaluate blood flow to the testicles.  COMPARISON:  None.  FINDINGS: Right testicle  Measurements: 4.2 x 2.7 x 3.2 cm. No mass or microlithiasis visualized.  Left testicle  Measurements: 4.2 x 2.5 x 3.1 cm. No mass or microlithiasis visualized.  Right epididymis:  Normal in size and appearance.  Left epididymis:  Normal in size and appearance.  Hydrocele: Small complex right hydrocele with debris. Physiologic fluid on the left.  Varicocele:  Present bilaterally.  Pulsed Doppler interrogation of both testes demonstrates normal low resistance arterial and venous waveforms bilaterally.  IMPRESSION: Normal sonographic appearance of the bilateral testes.  No evidence of testicular torsion.  Small complex right hydrocele with debris.  Bilateral  varicoceles.   Electronically Signed   By: Julian Hy M.D.   On: 09/04/2014 18:25   [place] Art/ven Flow Abd Pelv Doppler  09/04/2014   CLINICAL DATA:  Right testicular pain, status post inguinal hernia repair  EXAM: SCROTAL ULTRASOUND  DOPPLER ULTRASOUND OF THE TESTICLES  TECHNIQUE: Complete ultrasound examination of the testicles, epididymis, and other scrotal structures was performed. Color and spectral Doppler ultrasound were also utilized to evaluate blood flow to the testicles.  COMPARISON:  None.  FINDINGS: Right testicle  Measurements: 4.2 x 2.7 x 3.2 cm. No mass or microlithiasis visualized.  Left testicle  Measurements: 4.2 x 2.5 x 3.1  cm. No mass or microlithiasis visualized.  Right epididymis:  Normal in size and appearance.  Left epididymis:  Normal in size and appearance.  Hydrocele: Small complex right hydrocele with debris. Physiologic fluid on the left.  Varicocele:  Present bilaterally.  Pulsed Doppler interrogation of both testes demonstrates normal low resistance arterial and venous waveforms bilaterally.  IMPRESSION: Normal sonographic appearance of the bilateral testes.  No evidence of testicular torsion.  Small complex right hydrocele with debris.  Bilateral varicoceles.   Electronically Signed   By: Julian Hy M.D.   On: 09/04/2014 18:25   Dg Chest Port 1 View  09/04/2014   CLINICAL DATA:  Community acquired pneumonia. There degree heart block. Central line placement.  EXAM: PORTABLE CHEST - 1 VIEW  COMPARISON:  09/02/2014  FINDINGS: Mild cardiomegaly remains stable. Pacemaker remains in appropriate position. A new right jugular central venous catheter is seen with tip overlying the distal SVC. No evidence of pneumothorax.  Right lower lung airspace disease is again seen, without significant change allowing for differences in radiographic technique. Left lung remains clear. No definite pleural effusion seen on this portable exam.  IMPRESSION: New right internal jugular central  venous catheter in appropriate position. No pneumothorax visualized.  Right lower lung airspace disease, without significant change.   Electronically Signed   By: Earle Gell M.D.   On: 09/04/2014 14:38    PHYSICAL EXAM General: NAD Neck: JVP difficult, ?8 cm. No thyromegaly or thyroid nodule.  Lungs: Crackles right base. CV: Nondisplaced PMI.  Heart regular S1/S2, no S3/S4, no murmur. Trace ankle edema.  No carotid bruit.  Normal pedal pulses.  Abdomen: Soft, nontender, no hepatosplenomegaly, no distention.  Neurologic: Alert and oriented x 3.  Psych: Normal affect. Extremities: No clubbing or cyanosis.   TELEMETRY: Reviewed telemetry pt in NSR with V-pacing  ASSESSMENT AND PLAN: 64 yo with h/o PPM for CHB, cardiomyopathy of uncertain etiology, and atrial tachycardia presented with RLL PNA, atrial flutter, and acute on chronic systolic CHF.  1. Acute on chronic systolic CHF: EF 16% on echo this admission, persistently low.  Has CHB and paces RV. Etiology of cardiomyopathy is not certain.  Possibly related to RV pacing.  However, we have not ruled out CAD => had Cardiolite in 6/15 with scar but no ischemia (?true infarct versus artifact). Exam difficult for volume.  He diuresed well yesterday.  No BMET yet today. Co-ox low while in atrial flutter.  Digoxin started and now back in NSR, co-ox up to 78% yesterday, 59% today. CVP 4-7.  - Co-ox a little lower but will continue digoxin.  Repeat in am.   - Can restart po Lasix this evening, would use a little higher dose for home compared to prior, 40 qam/20 qpm.  - Continue Bidil 0.5 tab tid, lisinopril 2.5 daily, and Coreg 3.125 mg bid.  - Discussed with EP.  Plan to wait 1 month after this hospitalization given conversion to NSR on amiodarone and then will do cardiac cath.  If cath shows no revascularizable disease, he will then need upgrade to CRT-D given RV pacing and persistently low EF.   2. Atrial flutter: Patient has history of atrial  tachycardia.  This admission, came in with atrial flutter.  He was started on amiodarone, now back in NSR.   - Convert to po amiodarone 200 bid x 1 week then 200 mg daily. - He is on Xarelto.  - Think atrial flutter ablation would be reasonable consideration here.  Will discuss  with EP, could be done around time of CRT upgrade.  3. Possible RLL PNA: Continue ceftriaxone/doxycycline.  4. CKD: Creatinine lower.  5. 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  6. Needs to walk in halls.  Home soon, will arrange for followup in CHF clinic in 2 wks, will need cardiac cath in 1 month.   Loralie Champagne 09/06/2014 8:44 AM  Dr Lovena Le saw patient today, plans to do atrial flutter ablation in am.  Will then cath in 1 month (hold anticoagulation) followed by CRT if no revascularization.   Loralie Champagne 09/06/2014 9:46 AM

## 2014-09-06 NOTE — Progress Notes (Signed)
Spoke with Dr. Aundra Dubin about pts BP sys 87-97/, pt asymptomatic. Lasix given this am with over a liter off today. Order given to hold lasix tonight and tomorrow am. Start lasix 8/3 pm. Held afternoon bidil. Order to give pm coreg when BP better. Carroll Kinds RN

## 2014-09-07 ENCOUNTER — Encounter (HOSPITAL_COMMUNITY)
Admission: EM | Disposition: A | Discharge status: Home / Self Care | Payer: BC Managed Care – PPO | Source: Home / Self Care | Attending: Family Medicine

## 2014-09-07 DIAGNOSIS — I483 Typical atrial flutter: Secondary | ICD-10-CM

## 2014-09-07 HISTORY — PX: ELECTROPHYSIOLOGIC STUDY: SHX172A

## 2014-09-07 LAB — CULTURE, BLOOD (ROUTINE X 2)
CULTURE: NO GROWTH
Culture: NO GROWTH

## 2014-09-07 LAB — BASIC METABOLIC PANEL
Anion gap: 7 (ref 5–15)
BUN: 20 mg/dL (ref 6–20)
CALCIUM: 8.2 mg/dL — AB (ref 8.9–10.3)
CO2: 29 mmol/L (ref 22–32)
Chloride: 98 mmol/L — ABNORMAL LOW (ref 101–111)
Creatinine, Ser: 1.2 mg/dL (ref 0.61–1.24)
GFR calc Af Amer: >60 mL/min (ref >60)
GFR calc non Af Amer: >60 mL/min (ref >60)
Glucose, Bld: 127 mg/dL — ABNORMAL HIGH (ref 65–99)
Potassium: 4 mmol/L (ref 3.5–5.1)
SODIUM: 134 mmol/L — AB (ref 135–145)

## 2014-09-07 LAB — CBC
HEMATOCRIT: 45.7 % (ref 39.0–52.0)
Hemoglobin: 15.3 g/dL (ref 13.0–17.0)
MCH: 32.8 pg (ref 26.0–34.0)
MCHC: 33.5 g/dL (ref 30.0–36.0)
MCV: 97.9 fL (ref 78.0–100.0)
PLATELETS: 220 10*3/uL (ref 150–400)
RBC: 4.67 MIL/uL (ref 4.22–5.81)
RDW: 14.4 % (ref 11.5–15.5)
WBC: 9.2 10*3/uL (ref 4.0–10.5)

## 2014-09-07 LAB — HEPATIC FUNCTION PANEL
ALBUMIN: 2.5 g/dL — AB (ref 3.5–5.0)
ALK PHOS: 107 U/L (ref 38–126)
ALT: 165 U/L — ABNORMAL HIGH (ref 17–63)
AST: 75 U/L — ABNORMAL HIGH (ref 15–41)
BILIRUBIN DIRECT: 0.4 mg/dL (ref 0.1–0.5)
BILIRUBIN INDIRECT: 1 mg/dL — AB (ref 0.3–0.9)
TOTAL PROTEIN: 5.7 g/dL — AB (ref 6.5–8.1)
Total Bilirubin: 1.4 mg/dL — ABNORMAL HIGH (ref 0.3–1.2)

## 2014-09-07 SURGERY — A-FLUTTER/A-TACH/SVT ABLATION

## 2014-09-07 MED ORDER — HEPARIN (PORCINE) IN NACL 2-0.9 UNIT/ML-% IJ SOLN
INTRAMUSCULAR | Status: AC
  Filled 2014-09-07: qty 500

## 2014-09-07 MED ORDER — FENTANYL CITRATE (PF) 100 MCG/2ML IJ SOLN
INTRAMUSCULAR | Status: AC
  Filled 2014-09-07: qty 4

## 2014-09-07 MED ORDER — MIDAZOLAM HCL 5 MG/5ML IJ SOLN
INTRAMUSCULAR | Status: AC
  Filled 2014-09-07: qty 25

## 2014-09-07 MED ORDER — FENTANYL CITRATE (PF) 100 MCG/2ML IJ SOLN
INTRAMUSCULAR | Status: DC | PRN
  Administered 2014-09-07: 12.5 ug via INTRAVENOUS
  Administered 2014-09-07: 25 ug via INTRAVENOUS
  Administered 2014-09-07: 12.5 ug via INTRAVENOUS

## 2014-09-07 MED ORDER — ONDANSETRON HCL 4 MG/2ML IJ SOLN: 4.0000 mg | Freq: Four times a day (QID) | INTRAMUSCULAR | Status: DC | PRN

## 2014-09-07 MED ORDER — SODIUM CHLORIDE 0.9 % IV SOLN: 250.0000 mL | INTRAVENOUS | Status: DC | PRN

## 2014-09-07 MED ORDER — MIDAZOLAM HCL 5 MG/5ML IJ SOLN
INTRAMUSCULAR | Status: DC | PRN
  Administered 2014-09-07 (×5): 1 mg via INTRAVENOUS

## 2014-09-07 MED ORDER — SODIUM CHLORIDE 0.9 % IJ SOLN
3.0000 mL | Freq: Two times a day (BID) | INTRAMUSCULAR | Status: DC
  Administered 2014-09-07 – 2014-09-08 (×2): 3 mL via INTRAVENOUS

## 2014-09-07 MED ORDER — FUROSEMIDE 20 MG PO TABS: 20.0000 mg | ORAL_TABLET | Freq: Every evening | ORAL | Status: DC

## 2014-09-07 MED ORDER — SODIUM CHLORIDE 0.9 % IJ SOLN: 3.0000 mL | INTRAMUSCULAR | Status: DC | PRN

## 2014-09-07 MED ORDER — BUPIVACAINE HCL (PF) 0.25 % IJ SOLN
INTRAMUSCULAR | Status: AC
  Filled 2014-09-07: qty 60

## 2014-09-07 MED ORDER — FUROSEMIDE 40 MG PO TABS
40.0000 mg | ORAL_TABLET | Freq: Every day | ORAL | Status: DC
  Administered 2014-09-08: 40 mg via ORAL
  Filled 2014-09-07: qty 1

## 2014-09-07 MED ORDER — SODIUM CHLORIDE 0.9 % IV BOLUS (SEPSIS)
INTRAVENOUS | Status: DC | PRN
  Administered 2014-09-07: 250 mL via INTRAVENOUS

## 2014-09-07 MED ORDER — ACETAMINOPHEN 325 MG PO TABS: 650.0000 mg | ORAL_TABLET | ORAL | Status: DC | PRN

## 2014-09-07 MED ORDER — BUPIVACAINE HCL (PF) 0.25 % IJ SOLN
INTRAMUSCULAR | Status: DC | PRN
  Administered 2014-09-07: 40 mL

## 2014-09-07 SURGICAL SUPPLY — 10 items
BAG SNAP BAND KOVER 36X36 (MISCELLANEOUS) ×2 IMPLANT
CATH BLAZERPRIME XP (ABLATOR) ×2 IMPLANT
CATH JOSEPH QUAD ALLRED 6F REP (CATHETERS) ×2 IMPLANT
CATH POLARIS X 2.5/5/2.5 DECAP (CATHETERS) ×2 IMPLANT
PACK EP LATEX FREE (CUSTOM PROCEDURE TRAY) ×3
PACK EP LF (CUSTOM PROCEDURE TRAY) IMPLANT
PAD DEFIB LIFELINK (PAD) ×2 IMPLANT
SHEATH PINNACLE 6F 10CM (SHEATH) ×4 IMPLANT
SHEATH PINNACLE 8F 10CM (SHEATH) ×2 IMPLANT
SHIELD RADPAD SCOOP 12X17 (MISCELLANEOUS) ×2 IMPLANT

## 2014-09-07 NOTE — Progress Notes (Signed)
Spoke with Dr. Aundra Dubin about pt's pressure still soft. Stated to hold 1000 bidil this am. Carroll Kinds RN

## 2014-09-07 NOTE — Progress Notes (Signed)
Family Medicine Teaching Service Daily Progress Note Intern Pager: [phone]  Patient name: Philip Chavez Medical record number: [id number] Date of birth: 25-Sep-1950 Age: 64 y.o. Gender: male  Primary Care Provider: Junie Panning, DO Consultants: Cardiology and EP Code Status: Full   Pt Overview and Major Events to Date:  7/29: presented with hiccups (on chlopromazine)  7/30: EF 15% 8/1 Atrial Flutter Resolved will stop  8/2 Hyponatremia on admission Na 129, now resolved  8/2 AKI Cr 1.74 - >Cr 1.2, normalized  Assessment and Plan: Philip Chavez is a 64 y.o. male presenting with chest pain. 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  CHF: EF 20% on echo in 07/2013. Repeat echo this admission with an EF 15% and pulmonary HTN at 62. Takes Lasix 40 mg, lisinopril, lopressor simvastatin, and spironolactone at home, changed by cards.BNP 1598.9 and CK 323  - Hold home meds  - Lisinopril 2.5 mg qd, Digoxin 0.125 mg QD, Bidil 0.5 tablet TID, Coreg 3.125 mg QD - Continue po Lasix  40 qam/20 qpm.  - Continue to hold spiro, restart at dispo - monitor I&Os: Net - 600, probably not accurate  - weights : 191 lbs > 200 lb, will continue to monitor - outpt cath in about 4 weeks following restoration of sinus with as brief a hold of anticoagulation as possible, future CRT upgrade   Resolved Atrial flutter with CVR: No longer in Atrial flutter. Cardiology following. No Ablation while here, likely with CRT.  - Ablation today, will restart ASA afterwards  - Continue Telemetry   Pneumonia, CAP: Received one dose of CTX and azithro in ED. Endorsed some SOB before presentation. CXR showed R lower lobe PNA with stable cardiomegaly. WBC slightly increased at 12.3 trending with WBC 11.8 on 7/31. Negative strep pneumo. Legionella urine negative. - continue  PO Cefuroxime 500mg  (day 6 of 7)   - Blood cultures NGTD x 4 days, Gram stain  negative    Resolved Penial Pain:  U/S was performed yesterday significant only for small complex right hydrocele with debris and biilateral varicoceles. No testicular torsion noted with normal doppler flow.  Epididymis consider. UA negative for leukocytes, nitrates. Urine Gram stain negative. Urine culture currently negative. G/C negative   Transaminitis: Stable. Likely due to severe hepatic congestion from cor pulmonale as patient's EF is 15%.  ALT 172>> 165, AST 124>>76>75 with a Tbili of 2.2>1.4 Hepatitis A, B and C negative.  - continue to follow hepatic function  - holding simvastatin   Unwitnessed fall: patient's wife reports the patient fell the day PTA but she did not witness the fall and does not know if the patient hit his head - Given that patient is on Xarelto, obtained CT head that revealed R caudate and basal ganglia infarct but no acute findings  - consider stroke work up in the outpatient setting.    HTN:   - Pt relatively hypotensive with systolics in low 353I  - Hold home spiro  History DVT/PE: on Xarelto at home - Continue home Xarelto  FEN/GI: IV saline lock; heart healthy diet Prophylaxis: continue home Xarelto  Disposition: Floor status, discharge pending clinical improvement   Subjective:  Patient is without complaint. Denies fevers, chills, chest pain, SOB, n/v or abdominal pain.   Objective: Temp:  [97.4 F (36.3 C)-97.6 F (36.4 C)] 97.6 F (36.4 C) (08/03 0518) Pulse Rate:  [66] 66 (08/02 1537) Resp:  [16] 16 (08/02 1537) BP: (87-115)/(62-74)  103/72 mmHg (08/03 0808) SpO2:  [100 %] 100 % (08/02 1537) Weight:  [191 lb 1.6 oz (86.682 kg)] 191 lb 1.6 oz (86.682 kg) (08/03 0520) Physical Exam: General: sitting up in bed in NAD. Non-toxic Eyes: Conjunctivae non-injected with Scelra Icterus  ENTM: Moist mucous membranes. Oropharynx clear. No nasal discharge.  Neck: Supple, no LAD  Cardiovascular: RRR. No murmurs, rubs, or gallops noted. Radial  and DP pulses bounding No pitting edema noted. Respiratory: No increased WOB. CTAB without wheezing, rhonchi, or crackles noted. Abdomen: +BS, soft, non-distended, non-tender  Laboratory:  Recent Labs Lab 09/05/14 0940 09/06/14 0450 09/07/14 0705  WBC 13.1* 11.1* 9.2  HGB 16.4 14.3 15.3  HCT 47.6 42.3 45.7  PLT 203 210 220    Recent Labs Lab 09/05/14 0940 09/06/14 0450 09/07/14 0705  NA 134*  134* 135 134*  K 4.0  4.0 4.1 4.0  CL 96*  96* 97* 98*  CO2 29  28 31 29   BUN 34*  34* 29* 20  CREATININE 1.48*  1.36* 1.20 1.20  CALCIUM 8.6*  8.7* 7.8* 8.2*  PROT 6.6 5.5* 5.7*  BILITOT 1.5* 1.4* 1.4*  ALKPHOS 123 105 107  ALT 196* 163* 165*  AST 84* 76* 75*  GLUCOSE 287*  291* 149* 127*     Philip Cletis Media, MD 09/07/2014, 1:48 PM PGY-1, [place] Intern pager: [phone], text pages welcome

## 2014-09-07 NOTE — Progress Notes (Signed)
MD Aundra Dubin paged in regards to soft BP; MD advised to hold Bidil but administer lisinopril. Will continue to monitor closely.

## 2014-09-07 NOTE — Progress Notes (Signed)
Site area: rfv x 3 Site Prior to Removal:  Level 0 Pressure Applied For: 20 min Manual:yes    Patient Status During Pull:stable   Post Pull Site:  Level 0 Post Pull Instructions Given:  yes Post Pull Pulses Present: palpable Dressing Applied:clear   Bedrest begins @ 0037 till 2225  Comments:

## 2014-09-07 NOTE — H&P (View-Only) (Signed)
Patient ID: Philip Chavez, male   DOB: December 04, 1950, 64 y.o.   MRN: [id number]    Patient Name: Philip Chavez Date of Encounter: 09/06/2014     Active Problems:   History of DVT (deep vein thrombosis)   Hx of pulmonary embolus   Third degree heart block   Pacemaker   Atrial tachycardia   CAP (community acquired pneumonia)   Chronic combined systolic and diastolic CHF (congestive heart failure)   Dyslipidemia   Sleeping difficulty   Anxiety   Chest pain   Acute on chronic combined systolic and diastolic congestive heart failure, NYHA class 4   Atrial flutter, unspecified   Nonischemic cardiomyopathy   Testicular pain    SUBJECTIVE  C/o hiccups. Dyspnea improved.   CURRENT MEDS . carvedilol  3.125 mg Oral BID WC  . cefTRIAXone (ROCEPHIN)  IV  1 g Intravenous Q24H  . chlorproMAZINE  25 mg Oral TID  . digoxin  0.125 mg Oral Daily  . doxycycline  100 mg Oral Q12H  . furosemide  20 mg Oral QPM  . furosemide  40 mg Oral Daily  . isosorbide-hydrALAZINE  0.5 tablet Oral TID  . lisinopril  2.5 mg Oral QHS  . rivaroxaban  20 mg Oral Q supper    OBJECTIVE  Filed Vitals:   09/06/14 0010 09/06/14 0210 09/06/14 0450 09/06/14 0500  BP: 105/69 104/71 106/76 103/74  Pulse:   65 68  Temp:    97.5 F (36.4 C)  TempSrc:      Resp: 21 21 13 11   Height:      Weight:    197 lb 9.6 oz (89.631 kg)  SpO2:   97% 96%    Intake/Output Summary (Last 24 hours) at 09/06/14 0858 Last data filed at 09/06/14 0400  Gross per 24 hour  Intake   1054 ml  Output   2310 ml  Net  -1256 ml   Filed Weights   09/04/14 0400 09/05/14 0500 09/06/14 0500  Weight: 200 lb 1.6 oz (90.765 kg) 198 lb 14.4 oz (90.22 kg) 197 lb 9.6 oz (89.631 kg)    PHYSICAL EXAM  General: Pleasant, NAD. Neuro: Alert and oriented X 3. Moves all extremities spontaneously. Psych: Normal affect. HEENT:  Normal  Neck: Supple without bruits or JVD. Lungs:  Resp regular and unlabored, CTA. Heart: RRR with split S2, no  murmurs, PMI is enlarged Abdomen: Soft, non-tender, non-distended, BS + x 4.  Extremities: No clubbing, cyanosis or edema. DP/PT/Radials 2+ and equal bilaterally.  Accessory Clinical Findings  CBC  Recent Labs  09/05/14 0940 09/06/14 0450  WBC 13.1* 11.1*  HGB 16.4 14.3  HCT 47.6 42.3  MCV 96.9 95.7  PLT 203 992   Basic Metabolic Panel  Recent Labs  09/05/14 0940 09/06/14 0450  NA 134*  134* 135  K 4.0  4.0 4.1  CL 96*  96* 97*  CO2 29  28 31   GLUCOSE 287*  291* 149*  BUN 34*  34* 29*  CREATININE 1.48*  1.36* 1.20  CALCIUM 8.6*  8.7* 7.8*   Liver Function Tests  Recent Labs  09/05/14 0940 09/06/14 0450  AST 84* 76*  ALT 196* 163*  ALKPHOS 123 105  BILITOT 1.5* 1.4*  PROT 6.6 5.5*  ALBUMIN 2.8* 2.3*   No results for input(s): LIPASE, AMYLASE in the last 72 hours. Cardiac Enzymes  Recent Labs  09/04/14 1655  CKTOTAL 323   BNP Invalid input(s): POCBNP D-Dimer No results for input(s): DDIMER in the last  72 hours. Hemoglobin A1C  Recent Labs  09/03/14 1233  HGBA1C 6.7*   Fasting Lipid Panel  Recent Labs  09/03/14 1233  CHOL 101  HDL 21*  LDLCALC 67  TRIG 65  CHOLHDL 4.8   Thyroid Function Tests No results for input(s): TSH, T4TOTAL, T3FREE, THYROIDAB in the last 72 hours.  Invalid input(s): FREET3  TELE  NSR with ventricular pacing  Radiology/Studies  Dg Chest 2 View  09/02/2014   CLINICAL DATA:  Arrhythmia and hiccups for 3 days.  EXAM: CHEST  2 VIEW  COMPARISON:  04/12/2014 and prior radiographs  FINDINGS: Cardiomegaly and left sided pacemaker again noted.  Right lower lobe airspace disease is compatible with pneumonia.  There is no evidence of pulmonary edema, suspicious pulmonary nodule/mass, pleural effusion, or pneumothorax. No acute bony abnormalities are identified.  IMPRESSION: Right lower lobe airspace disease compatible with pneumonia. Radiographic follow-up to resolution is recommended.  Cardiomegaly.    Electronically Signed   By: Margarette Canada M.D.   On: 09/02/2014 18:54   Ct Head Wo Contrast  09/02/2014   CLINICAL DATA:  Somnolence  EXAM: CT HEAD WITHOUT CONTRAST  TECHNIQUE: Contiguous axial images were obtained from the base of the skull through the vertex without intravenous contrast.  COMPARISON:  None.  FINDINGS: There is encephalomalacia due to remote infarction in the right caudate and anterior basal ganglia. There is no intracranial hemorrhage, mass or evidence of acute infarction. Gray-white differentiation is preserved. There is mild generalized atrophy. There is no bony abnormality. Visible paranasal sinuses are clear.  IMPRESSION: Remote right caudate and basal ganglia infarction. Mild generalized atrophy. No acute findings.   Electronically Signed   By: Andreas Newport M.D.   On: 09/02/2014 23:27   US Scrotum  09/04/2014   CLINICAL DATA:  Right testicular pain, status post inguinal hernia repair  EXAM: SCROTAL ULTRASOUND  DOPPLER ULTRASOUND OF THE TESTICLES  TECHNIQUE: Complete ultrasound examination of the testicles, epididymis, and other scrotal structures was performed. Color and spectral Doppler ultrasound were also utilized to evaluate blood flow to the testicles.  COMPARISON:  None.  FINDINGS: Right testicle  Measurements: 4.2 x 2.7 x 3.2 cm. No mass or microlithiasis visualized.  Left testicle  Measurements: 4.2 x 2.5 x 3.1 cm. No mass or microlithiasis visualized.  Right epididymis:  Normal in size and appearance.  Left epididymis:  Normal in size and appearance.  Hydrocele: Small complex right hydrocele with debris. Physiologic fluid on the left.  Varicocele:  Present bilaterally.  Pulsed Doppler interrogation of both testes demonstrates normal low resistance arterial and venous waveforms bilaterally.  IMPRESSION: Normal sonographic appearance of the bilateral testes.  No evidence of testicular torsion.  Small complex right hydrocele with debris.  Bilateral varicoceles.   Electronically  Signed   By: Julian Hy M.D.   On: 09/04/2014 18:25   [place] Art/ven Flow Abd Pelv Doppler  09/04/2014   CLINICAL DATA:  Right testicular pain, status post inguinal hernia repair  EXAM: SCROTAL ULTRASOUND  DOPPLER ULTRASOUND OF THE TESTICLES  TECHNIQUE: Complete ultrasound examination of the testicles, epididymis, and other scrotal structures was performed. Color and spectral Doppler ultrasound were also utilized to evaluate blood flow to the testicles.  COMPARISON:  None.  FINDINGS: Right testicle  Measurements: 4.2 x 2.7 x 3.2 cm. No mass or microlithiasis visualized.  Left testicle  Measurements: 4.2 x 2.5 x 3.1 cm. No mass or microlithiasis visualized.  Right epididymis:  Normal in size and appearance.  Left epididymis:  Normal in size and appearance.  Hydrocele: Small complex right hydrocele with debris. Physiologic fluid on the left.  Varicocele:  Present bilaterally.  Pulsed Doppler interrogation of both testes demonstrates normal low resistance arterial and venous waveforms bilaterally.  IMPRESSION: Normal sonographic appearance of the bilateral testes.  No evidence of testicular torsion.  Small complex right hydrocele with debris.  Bilateral varicoceles.   Electronically Signed   By: Julian Hy M.D.   On: 09/04/2014 18:25   Dg Chest Port 1 View  09/04/2014   CLINICAL DATA:  Community acquired pneumonia. There degree heart block. Central line placement.  EXAM: PORTABLE CHEST - 1 VIEW  COMPARISON:  09/02/2014  FINDINGS: Mild cardiomegaly remains stable. Pacemaker remains in appropriate position. A new right jugular central venous catheter is seen with tip overlying the distal SVC. No evidence of pneumothorax.  Right lower lung airspace disease is again seen, without significant change allowing for differences in radiographic technique. Left lung remains clear. No definite pleural effusion seen on this portable exam.  IMPRESSION: New right internal jugular central venous catheter in appropriate  position. No pneumothorax visualized.  Right lower lung airspace disease, without significant change.   Electronically Signed   By: Earle Gell M.D.   On: 09/04/2014 14:38    ASSESSMENT AND PLAN  1. Acute on chronic systolic heart failure 2. Hiccups 3. Community acquired pneumonia 4. Complete heart block, s/p PPM 5. Acute atrial flutter, now back to NSR on amiodarone Rec: reviewed Dr. Donalynn Furlong note. He wants to wait on cardiac cath. Will plan flutter ablation tomorrow as per Dr. Angelina Pih note. We will stop amio. Use thorazine for hiccups which have been intractable for over a week, continue antibiotics for pneumonia. I would anticipate upgrade to a BiV ICD in 3-4 weeks.We will arrange this. Should be stable for discharge on 8/4.  Chief Walkup,M.D.  Mckenze Slone,M.D.  09/06/2014 8:58 AM

## 2014-09-07 NOTE — Progress Notes (Signed)
Patient ID: Philip Chavez, male   DOB: Sep 27, 1950, 64 y.o.   MRN: [id number]   64 yo with history of CHB s/p MDT PPM.  Echo in 2/15 showed EF 50-55% when pacemaker was placed.  However, EF had fallen to 20-25% by echo in 6/15.  Cardiolite at that time showed scar but no ischemia.  Patient additionally had a PE in 2/15.  Plan per Dr Lovena Le had been repeat echo with BiV upgrade if EF remained low. He also has a prior history of atrial tachycardia (paroxysmal).   Patient was admitted on 7/30 with dyspnea.  He was found to be in atrial flutter with controlled rate (new) and with possible RLL PNA.  Abx were started and he was put on amiodarone.  He has been on Xarelto.  He was thought to be volume overloaded but diuresed poorly initially with Lasix 40 mg IV bid.  Increasing Lasix to 80 mg IV bid improved diuresis.   Echo this admission with EF 15%, diffuse hypokinesis, RV mildly dilated with normal systolic function, PA systolic pressure 69 mmHg.   Today, he remains in NSR, amiodarone stopped yesterday with plan for atrial flutter ablation today.  BMET not available yet.  Got Lasix yesterday morning unfortunately, diuresed a lot and SBP down to 80s.  Meds held, up to 90s today.  He walked yesterday without dyspnea.  Weight down. Hiccups mostly resolved.   ECG: NSR with V-pacing  Scheduled Meds: . carvedilol  3.125 mg Oral BID WC  . cefUROXime  500 mg Oral BID WC  . chlorproMAZINE  25 mg Oral TID  . digoxin  0.125 mg Oral Daily  . [START ON 09/08/2014] furosemide  20 mg Oral QPM   And  . [START ON 09/08/2014] furosemide  40 mg Oral Daily  . isosorbide-hydrALAZINE  0.5 tablet Oral TID  . lisinopril  2.5 mg Oral QHS  . rivaroxaban  20 mg Oral Q supper   Continuous Infusions:   PRN Meds:.sodium chloride   Filed Vitals:   09/07/14 0010 09/07/14 0410 09/07/14 0518 09/07/14 0520  BP: 96/62 99/70    Pulse:      Temp:   97.6 F (36.4 C)   TempSrc:      Resp:      Height:      Weight:    191 lb  1.6 oz (86.682 kg)  SpO2:        Intake/Output Summary (Last 24 hours) at 09/07/14 0801 Last data filed at 09/07/14 0500  Gross per 24 hour  Intake    720 ml  Output   2110 ml  Net  -1390 ml    LABS: Basic Metabolic Panel:  Recent Labs  09/05/14 0940 09/06/14 0450  NA 134*  134* 135  K 4.0  4.0 4.1  CL 96*  96* 97*  CO2 29  28 31   GLUCOSE 287*  291* 149*  BUN 34*  34* 29*  CREATININE 1.48*  1.36* 1.20  CALCIUM 8.6*  8.7* 7.8*   Liver Function Tests:  Recent Labs  09/05/14 0940 09/06/14 0450  AST 84* 76*  ALT 196* 163*  ALKPHOS 123 105  BILITOT 1.5* 1.4*  PROT 6.6 5.5*  ALBUMIN 2.8* 2.3*   No results for input(s): LIPASE, AMYLASE in the last 72 hours. CBC:  Recent Labs  09/06/14 0450 09/07/14 0705  WBC 11.1* 9.2  HGB 14.3 15.3  HCT 42.3 45.7  MCV 95.7 97.9  PLT 210 220   Cardiac Enzymes:  Recent  Labs  09/04/14 1655  CKTOTAL 323   BNP: Invalid input(s): POCBNP D-Dimer: No results for input(s): DDIMER in the last 72 hours. Hemoglobin A1C: No results for input(s): HGBA1C in the last 72 hours. Fasting Lipid Panel: No results for input(s): CHOL, HDL, LDLCALC, TRIG, CHOLHDL, LDLDIRECT in the last 72 hours. Thyroid Function Tests: No results for input(s): TSH, T4TOTAL, T3FREE, THYROIDAB in the last 72 hours.  Invalid input(s): FREET3 Anemia Panel: No results for input(s): VITAMINB12, FOLATE, FERRITIN, TIBC, IRON, RETICCTPCT in the last 72 hours.  RADIOLOGY: Dg Chest 2 View  09/02/2014   CLINICAL DATA:  Arrhythmia and hiccups for 3 days.  EXAM: CHEST  2 VIEW  COMPARISON:  04/12/2014 and prior radiographs  FINDINGS: Cardiomegaly and left sided pacemaker again noted.  Right lower lobe airspace disease is compatible with pneumonia.  There is no evidence of pulmonary edema, suspicious pulmonary nodule/mass, pleural effusion, or pneumothorax. No acute bony abnormalities are identified.  IMPRESSION: Right lower lobe airspace disease compatible  with pneumonia. Radiographic follow-up to resolution is recommended.  Cardiomegaly.   Electronically Signed   By: Margarette Canada M.D.   On: 09/02/2014 18:54   Ct Head Wo Contrast  09/02/2014   CLINICAL DATA:  Somnolence  EXAM: CT HEAD WITHOUT CONTRAST  TECHNIQUE: Contiguous axial images were obtained from the base of the skull through the vertex without intravenous contrast.  COMPARISON:  None.  FINDINGS: There is encephalomalacia due to remote infarction in the right caudate and anterior basal ganglia. There is no intracranial hemorrhage, mass or evidence of acute infarction. Gray-white differentiation is preserved. There is mild generalized atrophy. There is no bony abnormality. Visible paranasal sinuses are clear.  IMPRESSION: Remote right caudate and basal ganglia infarction. Mild generalized atrophy. No acute findings.   Electronically Signed   By: Andreas Newport M.D.   On: 09/02/2014 23:27   US Scrotum  09/04/2014   CLINICAL DATA:  Right testicular pain, status post inguinal hernia repair  EXAM: SCROTAL ULTRASOUND  DOPPLER ULTRASOUND OF THE TESTICLES  TECHNIQUE: Complete ultrasound examination of the testicles, epididymis, and other scrotal structures was performed. Color and spectral Doppler ultrasound were also utilized to evaluate blood flow to the testicles.  COMPARISON:  None.  FINDINGS: Right testicle  Measurements: 4.2 x 2.7 x 3.2 cm. No mass or microlithiasis visualized.  Left testicle  Measurements: 4.2 x 2.5 x 3.1 cm. No mass or microlithiasis visualized.  Right epididymis:  Normal in size and appearance.  Left epididymis:  Normal in size and appearance.  Hydrocele: Small complex right hydrocele with debris. Physiologic fluid on the left.  Varicocele:  Present bilaterally.  Pulsed Doppler interrogation of both testes demonstrates normal low resistance arterial and venous waveforms bilaterally.  IMPRESSION: Normal sonographic appearance of the bilateral testes.  No evidence of testicular  torsion.  Small complex right hydrocele with debris.  Bilateral varicoceles.   Electronically Signed   By: Julian Hy M.D.   On: 09/04/2014 18:25   [place] Art/ven Flow Abd Pelv Doppler  09/04/2014   CLINICAL DATA:  Right testicular pain, status post inguinal hernia repair  EXAM: SCROTAL ULTRASOUND  DOPPLER ULTRASOUND OF THE TESTICLES  TECHNIQUE: Complete ultrasound examination of the testicles, epididymis, and other scrotal structures was performed. Color and spectral Doppler ultrasound were also utilized to evaluate blood flow to the testicles.  COMPARISON:  None.  FINDINGS: Right testicle  Measurements: 4.2 x 2.7 x 3.2 cm. No mass or microlithiasis visualized.  Left testicle  Measurements: 4.2 x  2.5 x 3.1 cm. No mass or microlithiasis visualized.  Right epididymis:  Normal in size and appearance.  Left epididymis:  Normal in size and appearance.  Hydrocele: Small complex right hydrocele with debris. Physiologic fluid on the left.  Varicocele:  Present bilaterally.  Pulsed Doppler interrogation of both testes demonstrates normal low resistance arterial and venous waveforms bilaterally.  IMPRESSION: Normal sonographic appearance of the bilateral testes.  No evidence of testicular torsion.  Small complex right hydrocele with debris.  Bilateral varicoceles.   Electronically Signed   By: Julian Hy M.D.   On: 09/04/2014 18:25   Dg Chest Port 1 View  09/04/2014   CLINICAL DATA:  Community acquired pneumonia. There degree heart block. Central line placement.  EXAM: PORTABLE CHEST - 1 VIEW  COMPARISON:  09/02/2014  FINDINGS: Mild cardiomegaly remains stable. Pacemaker remains in appropriate position. A new right jugular central venous catheter is seen with tip overlying the distal SVC. No evidence of pneumothorax.  Right lower lung airspace disease is again seen, without significant change allowing for differences in radiographic technique. Left lung remains clear. No definite pleural effusion seen on this  portable exam.  IMPRESSION: New right internal jugular central venous catheter in appropriate position. No pneumothorax visualized.  Right lower lung airspace disease, without significant change.   Electronically Signed   By: Earle Gell M.D.   On: 09/04/2014 14:38    PHYSICAL EXAM General: NAD Neck: JVP 7 cm. No thyromegaly or thyroid nodule.  Lungs: Crackles right base. CV: Nondisplaced PMI.  Heart regular S1/S2, no S3/S4, no murmur. No edema.  No carotid bruit.  Normal pedal pulses.  Abdomen: Soft, nontender, no hepatosplenomegaly, no distention.  Neurologic: Alert and oriented x 3.  Psych: Normal affect. Extremities: No clubbing or cyanosis.   TELEMETRY: Reviewed telemetry pt in NSR with V-pacing  ASSESSMENT AND PLAN: 64 yo with h/o PPM for CHB, cardiomyopathy of uncertain etiology, and atrial tachycardia presented with RLL PNA, atrial flutter, and acute on chronic systolic CHF.  1. Acute on chronic systolic CHF: EF 19% on echo this admission, persistently low.  Has CHB and paces RV. Etiology of cardiomyopathy is not certain.  Possibly related to RV pacing.  However, we have not ruled out CAD => had Cardiolite in 6/15 with scar but no ischemia (?true infarct versus artifact).  No BMET yet today. Co-ox low while in atrial flutter but improved with NSR.  He is not volume overloaded now.   - Hold Lasix today, restart 40 qam/20 qpm tomorrow (afraid we have have overshot a bit with diuresis).    - Continue Bidil 0.5 tab tid, lisinopril 2.5 qhs, and Coreg 3.125 mg bid => will hold am Bidil dose.  - Discussed with EP.  Plan to wait 1 month after this hospitalization given conversion to NSR on amiodarone and then will do cardiac cath.  If cath shows no revascularizable disease, he will then need upgrade to CRT-D given RV pacing and persistently low EF.   2. Atrial flutter: Patient has history of atrial tachycardia.  This admission, came in with atrial flutter.  He was started on amiodarone, now back  in NSR. Amiodarone stopped, plan atrial flutter ablation today.  Continue Xarelto.  3. Possible RLL PNA: Continue ceftriaxone/doxycycline.  4. CKD: Pending BMET.   5. 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  6. Hopefully home 8/4. Will arrange for followup in CHF clinic in 2 wks, will need cardiac cath in 1 month.   Loralie Champagne 09/07/2014 8:01  AM

## 2014-09-07 NOTE — Interval H&P Note (Signed)
History and Physical Interval Note:  09/07/2014 2:16 PM  Philip Chavez  has presented today for surgery, with the diagnosis of aflutter  The various methods of treatment have been discussed with the patient and family. After consideration of risks, benefits and other options for treatment, the patient has consented to  Procedure(s): A-Flutter (N/A) as a surgical intervention .  The patient's history has been reviewed, patient examined, no change in status, stable for surgery.  I have reviewed the patient's chart and labs.  Questions were answered to the patient's satisfaction.     [name].D.

## 2014-09-08 ENCOUNTER — Encounter (HOSPITAL_COMMUNITY): Payer: Self-pay | Admitting: Internal Medicine

## 2014-09-08 DIAGNOSIS — R066 Hiccough: Secondary | ICD-10-CM | POA: Insufficient documentation

## 2014-09-08 LAB — CBC
HEMATOCRIT: 45.1 % (ref 39.0–52.0)
Hemoglobin: 15.1 g/dL (ref 13.0–17.0)
MCH: 32.1 pg (ref 26.0–34.0)
MCHC: 33.5 g/dL (ref 30.0–36.0)
MCV: 96 fL (ref 78.0–100.0)
Platelets: 219 10*3/uL (ref 150–400)
RBC: 4.7 MIL/uL (ref 4.22–5.81)
RDW: 14 % (ref 11.5–15.5)
WBC: 6.6 10*3/uL (ref 4.0–10.5)

## 2014-09-08 LAB — BASIC METABOLIC PANEL
Anion gap: 6 (ref 5–15)
BUN: 18 mg/dL (ref 6–20)
CO2: 25 mmol/L (ref 22–32)
Calcium: 8 mg/dL — ABNORMAL LOW (ref 8.9–10.3)
Chloride: 100 mmol/L — ABNORMAL LOW (ref 101–111)
Creatinine, Ser: 1.03 mg/dL (ref 0.61–1.24)
GFR calc Af Amer: >60 mL/min (ref >60)
GFR calc non Af Amer: >60 mL/min (ref >60)
GLUCOSE: 200 mg/dL — AB (ref 65–99)
Potassium: 3.9 mmol/L (ref 3.5–5.1)
SODIUM: 131 mmol/L — AB (ref 135–145)

## 2014-09-08 LAB — HEPATIC FUNCTION PANEL
ALBUMIN: 2.3 g/dL — AB (ref 3.5–5.0)
ALT: 173 U/L — ABNORMAL HIGH (ref 17–63)
AST: 106 U/L — AB (ref 15–41)
Alkaline Phosphatase: 107 U/L (ref 38–126)
BILIRUBIN DIRECT: 0.4 mg/dL (ref 0.1–0.5)
BILIRUBIN INDIRECT: 0.7 mg/dL (ref 0.3–0.9)
TOTAL PROTEIN: 5.6 g/dL — AB (ref 6.5–8.1)
Total Bilirubin: 1.1 mg/dL (ref 0.3–1.2)

## 2014-09-08 MED ORDER — DIGOXIN 125 MCG PO TABS: 0.1250 mg | ORAL_TABLET | Freq: Every day | ORAL | Status: DC

## 2014-09-08 MED ORDER — ISOSORB DINITRATE-HYDRALAZINE 20-37.5 MG PO TABS: 0.5000 | ORAL_TABLET | Freq: Three times a day (TID) | ORAL | Status: DC

## 2014-09-08 MED ORDER — LISINOPRIL 5 MG PO TABS: 2.5000 mg | ORAL_TABLET | Freq: Every day | ORAL | Status: DC

## 2014-09-08 MED ORDER — CARVEDILOL 3.125 MG PO TABS: 3.1250 mg | ORAL_TABLET | Freq: Two times a day (BID) | ORAL | Status: DC

## 2014-09-08 MED ORDER — FUROSEMIDE 20 MG PO TABS
ORAL_TABLET | ORAL | Status: DC
Start: 2014-09-08 — End: 2014-10-25

## 2014-09-08 MED ORDER — CEFUROXIME AXETIL 500 MG PO TABS: 500.0000 mg | ORAL_TABLET | Freq: Two times a day (BID) | ORAL | Status: DC

## 2014-09-08 MED FILL — Heparin Sodium (Porcine) 2 Unit/ML in Sodium Chloride 0.9%: INTRAMUSCULAR | Qty: 500 | Status: AC

## 2014-09-08 NOTE — Discharge Summary (Signed)
[facility] Discharge Summary  Patient name: Philip Chavez Medical record number: [id number] Date of birth: [birth year] Age: 64 y.o. Gender: male Date of Admission: 09/02/2014  Date of Discharge: 09/08/2014  Admitting Physician: Leeanne Rio, MD  Primary Care Provider: Junie Panning, DO Consultants: Cardiology   Indication for Hospitalization: CAP, Atrial Flutter   Discharge Diagnoses/Problem List:  CAP Atrial Flutter  Congestive Heart Failure  Transaminitis Hypertension  Acute Kidney Injury  Hyponatremia   Disposition: Home   Discharge Condition: Stable, Improved    Discharge Exam:  General: sitting up in bed in NAD. Non-toxic Eyes: Conjunctivae non-injected with Sclera Icterus  ENTM: Moist mucous membranes. Oropharynx clear. No nasal discharge.  Neck: Supple, no LAD  Cardiovascular: RRR. No murmurs, rubs, or gallops noted. Radial and DP pulses bounding No pitting edema noted. Respiratory: No increased WOB. CTAB without wheezing, rhonchi, or crackles noted. Abdomen: +BS, soft, non-distended, non-tender Skin: No rashes or bruises   Brief Hospital Course:  Philip Chavez is a 64 y.o. male presenting with chest pain and was endorsing SOB in ED. 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 Patient was found to have community acquired pneumonia with CXR showing right lower lobe pneumonia and WBC 12.3. Patient was started on Ceftriaxone and azithromycin. However due to QT prolongation azithromycin was discontinued. Patient was negative for strep pneumonia and urine Legionella.  Blood and urine cultures were negative for growth as was urine gram stain. Patient improved on antibiotics and was transitioned to PO Cefuroxime. He received a total of 6 days in the hospital, within a prescription for 1 more day of antibiotics  Patient was also worked up for cardiac causes and on 7/30, patient  had an ECHO that was noted to have an EF of 15%, lower than previous ECHO on 07/2013 of 20-25%. On 7/31, patient was noted to be in atrial flutter on his EKG. Cardiology started patient on an Amiodarone drip and on 8/1 patient converted back into SA rhythm. Patient then received several does of Lasix for his continued shortness of breath. Cardiology significantly modified his cardiac medications and performed an ablation. Furthermore patient was schedule to upgrade pacemaker to an ICD, due to patients decreasing heart function.   Patient was also admitted with an AKI  with creatinine of 1.74, however this resolved by 8/2 of his hospital stay with SCr at 1.03 at discharge. Patient transaminitis (AST/ALT 172/224) , negative for hepatitis A,B,C, was considered likely due to his progressively worsening congestive heart failure. As patient's, heart failure symptoms resolved (i.e sob), patient's transaminitis also trended down with his last values of AST/ALT 165/75.   Incidentally, patient was also worked up testicular torsion vs epididymitis, as he complained of significant penial pain. However, patient doppler while positive for slight hydrocele and bilateral variocele's, was negative for testicular torsion. Urine culture, UA, and G/C were all negative.   Issues for Follow Up: 1. Patient medications were changed significantly by cardiology, please see below 2. Follow up on patient's CAP, last day of antibiotics tomorrow for a total of 7 days 3. Consider further stroke work up as an outpatient, as patient was noted to have a remote right caudate and basal ganglia infact  Significant Procedures:   Significant Labs and Imaging:   Recent Labs Lab 09/06/14 0450 09/07/14 0705 09/08/14 0325  WBC 11.1* 9.2 6.6  HGB 14.3 15.3 15.1  HCT 42.3 45.7 45.1  PLT 210 220 219  Recent Labs Lab 09/04/14 0324 09/04/14 1655 09/05/14 0940 09/06/14 0450 09/07/14 0705 09/08/14 0325  NA 129* 129* 134*  134*  135 134* 131*  K 5.4* 5.1 4.0  4.0 4.1 4.0 3.9  CL 94* 94* 96*  96* 97* 98* 100*  CO2 24 26 29  28 31 29 25   GLUCOSE 232* 264* 287*  291* 149* 127* 200*  BUN 45* 39* 34*  34* 29* 20 18  CREATININE 1.78* 1.55* 1.48*  1.36* 1.20 1.20 1.03  CALCIUM 8.3* 8.6* 8.6*  8.7* 7.8* 8.2* 8.0*  ALKPHOS 126  --  123 105 107 107  AST 125*  --  84* 76* 75* 106*  ALT 205*  --  196* 163* 165* 173*  ALBUMIN 2.6*  --  2.8* 2.3* 2.5* 2.3*    Results/Tests Pending at Time of Discharge: None   Discharge Medications:    Medication List    STOP taking these medications        aspirin 81 MG EC tablet     metoprolol 50 MG tablet  Commonly known as:  LOPRESSOR     sildenafil 100 MG tablet  Commonly known as:  VIAGRA      TAKE these medications        calcium carbonate 500 MG chewable tablet  Commonly known as:  TUMS - dosed in mg elemental calcium  Chew 1 tablet by mouth as needed for indigestion or heartburn.     carvedilol 3.125 MG tablet  Commonly known as:  COREG  Take 1 tablet (3.125 mg total) by mouth 2 (two) times daily with a meal.     cefUROXime 500 MG tablet  Commonly known as:  CEFTIN  Take 1 tablet (500 mg total) by mouth 2 (two) times daily with a meal.     digoxin 0.125 MG tablet  Commonly known as:  LANOXIN  Take 1 tablet (0.125 mg total) by mouth daily.     furosemide 20 MG tablet  Commonly known as:  LASIX  Take two tablets in the morning and 1 tablet at night     isosorbide-hydrALAZINE 20-37.5 MG per tablet  Commonly known as:  BIDIL  Take 0.5 tablets by mouth 3 (three) times daily.  Notes to Patient:  Half a tablet     lisinopril 5 MG tablet  Commonly known as:  PRINIVIL,ZESTRIL  Take 0.5 tablets (2.5 mg total) by mouth daily.     rivaroxaban 20 MG Tabs tablet  Commonly known as:  XARELTO  Take 1 tablet (20 mg total) by mouth daily with supper.     simvastatin 20 MG tablet  Commonly known as:  ZOCOR  Take 1 tablet (20 mg total) by mouth daily at 6 PM.      spironolactone 25 MG tablet  Commonly known as:  ALDACTONE  Take 0.5 tablets (12.5 mg total) by mouth daily.       Discharge Instructions: Please refer to Patient Instructions section of EMR for full details.  Patient was counseled important signs and symptoms that should prompt return to medical care, changes in medications, dietary instructions, activity restrictions, and follow up appointments.   Follow-Up Appointments: Follow-up Information    Follow up with Loralie Champagne, MD. Go on 09/21/2014.   Specialty:  Cardiology   Why:  @ 0920 am for hospital follow up.  Please bring all of your medications with you to your visit.  Pt parking code is [id number].   Contact information:   [place].  [place] [address] [phone]       Follow up with Carlyle Dolly, MD. Go on 09/13/2014.   Specialty:  Family Medicine   Why:  @  9 AM    Contact information:   [place] [address] [phone]       Follow up with [name], MD On 10/12/2014.   Specialty:  Cardiology   Why:  at [place] information:   [address] [place] [address] [place] [address] [phone]       Tonette Bihari, MD 09/08/2014, 7:03 PM PGY-1, [place]

## 2014-09-08 NOTE — Care Management Note (Signed)
Case Management Note CM note started by Whitman Hero RNCM  Patient Details  Name: Philip Chavez MRN: [id number] Date of Birth: Jul 10, 1950  Subjective/Objective:                 From home with wife, independent with ADL'S prior to hospitalization. Admitted with CP/ PNA, pt with significant cardiac hx (pacer, afib/flutter, severe systolic CHF).   Action/Plan:  Return to home when medically stable. CM to f/u with d/c needs.  Expected Discharge Date:       09/08/14           Expected Discharge Plan:  Home/Self Care  In-House Referral:     Discharge planning Services  CM Consult  Post Acute Care Choice:    Choice offered to:     DME Arranged:    DME Agency:     HH Arranged:    HH Agency:     Status of Service:  Completed, signed off  Medicare Important Message Given:    Date Medicare IM Given:    Medicare IM give by:    Date Additional Medicare IM Given:    Additional Medicare Important Message give by:     If discussed at [place] of Stay Meetings, dates discussed:  09/08/14  Additional Comments: Philip Chavez (Spouse)  [phone]  Dawayne Patricia, RN 09/08/2014, 3:00 PM

## 2014-09-08 NOTE — Progress Notes (Signed)
SUBJECTIVE: The patient is doing well today.  At this time, he denies chest pain, shortness of breath, or any new concerns.  CURRENT MEDICATIONS: . carvedilol  3.125 mg Oral BID WC  . cefUROXime  500 mg Oral BID WC  . chlorproMAZINE  25 mg Oral TID  . digoxin  0.125 mg Oral Daily  . furosemide  20 mg Oral QPM   And  . furosemide  40 mg Oral Daily  . isosorbide-hydrALAZINE  0.5 tablet Oral TID  . lisinopril  2.5 mg Oral QHS  . rivaroxaban  20 mg Oral Q supper  . sodium chloride  3 mL Intravenous Q12H      OBJECTIVE: Physical Exam: Filed Vitals:   09/07/14 1752 09/07/14 1952 09/08/14 0100 09/08/14 0530  BP: 105/71 98/68 110/77 110/78  Pulse:    81  Temp:  97.5 F (36.4 C) 97.6 F (36.4 C) 97.7 F (36.5 C)  TempSrc:  Oral Oral Oral  Resp:  18 18 18   Height:      Weight:    193 lb 3.2 oz (87.635 kg)  SpO2:  97% 99% 100%    Intake/Output Summary (Last 24 hours) at 09/08/14 0724 Last data filed at 09/08/14 0600  Gross per 24 hour  Intake    720 ml  Output    825 ml  Net   -105 ml    Telemetry reveals sinus rhythm with V pacing  GEN- The patient is well appearing, alert and oriented x 3 today.   Head- normocephalic, atraumatic Eyes-  Sclera clear, conjunctiva pink Ears- hearing intact Oropharynx- clear Neck- supple, no JVP Lymph- no cervical lymphadenopathy Lungs- Clear to ausculation bilaterally, normal work of breathing Heart- Regular rate and rhythm  GI- soft, NT, ND, + BS Extremities- no clubbing, cyanosis, or edema Skin- no rash or lesion Psych- euthymic mood, full affect Neuro- strength and sensation are intact  LABS: Basic Metabolic Panel:  Recent Labs  09/07/14 0705 09/08/14 0325  NA 134* 131*  K 4.0 3.9  CL 98* 100*  CO2 29 25  GLUCOSE 127* 200*  BUN 20 18  CREATININE 1.20 1.03  CALCIUM 8.2* 8.0*   Liver Function Tests:  Recent Labs  09/07/14 0705 09/08/14 0325  AST 75* 106*  ALT 165* 173*  ALKPHOS 107 107  BILITOT 1.4* 1.1    PROT 5.7* 5.6*  ALBUMIN 2.5* 2.3*   CBC:  Recent Labs  09/07/14 0705 09/08/14 0325  WBC 9.2 6.6  HGB 15.3 15.1  HCT 45.7 45.1  MCV 97.9 96.0  PLT 220 219   RADIOLOGY: Dg Chest 2 View 09/02/2014   CLINICAL DATA:  Arrhythmia and hiccups for 3 days.  EXAM: CHEST  2 VIEW  COMPARISON:  04/12/2014 and prior radiographs  FINDINGS: Cardiomegaly and left sided pacemaker again noted.  Right lower lobe airspace disease is compatible with pneumonia.  There is no evidence of pulmonary edema, suspicious pulmonary nodule/mass, pleural effusion, or pneumothorax. No acute bony abnormalities are identified.  IMPRESSION: Right lower lobe airspace disease compatible with pneumonia. Radiographic follow-up to resolution is recommended.  Cardiomegaly.   Electronically Signed   By: Margarette Canada M.D.   On: 09/02/2014 18:54    ASSESSMENT AND PLAN:  Active Problems:   History of DVT (deep vein thrombosis)   Hx of pulmonary embolus   Third degree heart block   Pacemaker   Atrial tachycardia   CAP (community acquired pneumonia)   Chronic combined systolic and diastolic CHF (congestive heart  failure)   Dyslipidemia   Sleeping difficulty   Anxiety   Chest pain   Acute on chronic combined systolic and diastolic congestive heart failure, NYHA class 4   Atrial flutter, unspecified   Nonischemic cardiomyopathy   Testicular pain  1.  Atrial flutter S/p ablation - maintaining SR Continue Xarelto  2.  Cardiomyopathy/congestive heart failure Will need outpatient follow up in 3-4 weeks to discuss CRT upgrade - I will arrange AHF team arranging outpatient catheterization and follow up  [facility] from EP standpoint to DC home today.    Chanetta Marshall, NP 09/08/2014 7:26 AM  EP Attending  Patient seen and examined. He is doing well s/p ablation of atrial flutter. [facility] for discharge. He will followup in several weeks to discuss BiV ICD upgrade.   [name].D.

## 2014-09-08 NOTE — Progress Notes (Signed)
Patient ID: Philip Chavez, male   DOB: 12-18-1950, 64 y.o.   MRN: [id number]   64 yo with history of CHB s/p MDT PPM.  Echo in 2/15 showed EF 50-55% when pacemaker was placed.  However, EF had fallen to 20-25% by echo in 6/15.  Cardiolite at that time showed scar but no ischemia.  Patient additionally had a PE in 2/15.  Plan per Dr Lovena Le had been repeat echo with BiV upgrade if EF remained low. He also has a prior history of atrial tachycardia (paroxysmal).   Patient was admitted on 7/30 with dyspnea.  He was found to be in atrial flutter with controlled rate (new) and with possible RLL PNA.  Abx were started and he was put on amiodarone.  He has been on Xarelto.  He was thought to be volume overloaded but diuresed poorly initially with Lasix 40 mg IV bid.  Increasing Lasix to 80 mg IV bid improved diuresis.   Echo this admission with EF 15%, diffuse hypokinesis, RV mildly dilated with normal systolic function, PA systolic pressure 69 mmHg.   Atrial flutter ablation yesterday.  Doing well today.  BP stable, getting all his meds today with SBP around 110.   ECG: NSR with V-pacing  Scheduled Meds: . carvedilol  3.125 mg Oral BID WC  . cefUROXime  500 mg Oral BID WC  . chlorproMAZINE  25 mg Oral TID  . digoxin  0.125 mg Oral Daily  . furosemide  20 mg Oral QPM   And  . furosemide  40 mg Oral Daily  . isosorbide-hydrALAZINE  0.5 tablet Oral TID  . lisinopril  2.5 mg Oral QHS  . rivaroxaban  20 mg Oral Q supper  . sodium chloride  3 mL Intravenous Q12H   Continuous Infusions:   PRN Meds:.sodium chloride, acetaminophen, ondansetron (ZOFRAN) IV, sodium chloride, sodium chloride   Filed Vitals:   09/08/14 0500 09/08/14 0530 09/08/14 0737 09/08/14 1229  BP: 110/78 110/78 110/69 110/90  Pulse: 71 81 75 74  Temp:  97.7 F (36.5 C) 97.7 F (36.5 C) 98 F (36.7 C)  TempSrc:  Oral Oral Oral  Resp:  18  18  Height:      Weight:  193 lb 3.2 oz (87.635 kg)    SpO2: 98% 100% 100% 100%     Intake/Output Summary (Last 24 hours) at 09/08/14 1346 Last data filed at 09/08/14 0920  Gross per 24 hour  Intake   1320 ml  Output    825 ml  Net    495 ml    LABS: Basic Metabolic Panel:  Recent Labs  09/07/14 0705 09/08/14 0325  NA 134* 131*  K 4.0 3.9  CL 98* 100*  CO2 29 25  GLUCOSE 127* 200*  BUN 20 18  CREATININE 1.20 1.03  CALCIUM 8.2* 8.0*   Liver Function Tests:  Recent Labs  09/07/14 0705 09/08/14 0325  AST 75* 106*  ALT 165* 173*  ALKPHOS 107 107  BILITOT 1.4* 1.1  PROT 5.7* 5.6*  ALBUMIN 2.5* 2.3*   No results for input(s): LIPASE, AMYLASE in the last 72 hours. CBC:  Recent Labs  09/07/14 0705 09/08/14 0325  WBC 9.2 6.6  HGB 15.3 15.1  HCT 45.7 45.1  MCV 97.9 96.0  PLT 220 219   Cardiac Enzymes: No results for input(s): CKTOTAL, CKMB, CKMBINDEX, TROPONINI in the last 72 hours. BNP: Invalid input(s): POCBNP D-Dimer: No results for input(s): DDIMER in the last 72 hours. Hemoglobin A1C: No results  for input(s): HGBA1C in the last 72 hours. Fasting Lipid Panel: No results for input(s): CHOL, HDL, LDLCALC, TRIG, CHOLHDL, LDLDIRECT in the last 72 hours. Thyroid Function Tests: No results for input(s): TSH, T4TOTAL, T3FREE, THYROIDAB in the last 72 hours.  Invalid input(s): FREET3 Anemia Panel: No results for input(s): VITAMINB12, FOLATE, FERRITIN, TIBC, IRON, RETICCTPCT in the last 72 hours.  RADIOLOGY: Dg Chest 2 View  09/02/2014   CLINICAL DATA:  Arrhythmia and hiccups for 3 days.  EXAM: CHEST  2 VIEW  COMPARISON:  04/12/2014 and prior radiographs  FINDINGS: Cardiomegaly and left sided pacemaker again noted.  Right lower lobe airspace disease is compatible with pneumonia.  There is no evidence of pulmonary edema, suspicious pulmonary nodule/mass, pleural effusion, or pneumothorax. No acute bony abnormalities are identified.  IMPRESSION: Right lower lobe airspace disease compatible with pneumonia. Radiographic follow-up to resolution  is recommended.  Cardiomegaly.   Electronically Signed   By: Margarette Canada M.D.   On: 09/02/2014 18:54   Ct Head Wo Contrast  09/02/2014   CLINICAL DATA:  Somnolence  EXAM: CT HEAD WITHOUT CONTRAST  TECHNIQUE: Contiguous axial images were obtained from the base of the skull through the vertex without intravenous contrast.  COMPARISON:  None.  FINDINGS: There is encephalomalacia due to remote infarction in the right caudate and anterior basal ganglia. There is no intracranial hemorrhage, mass or evidence of acute infarction. Gray-white differentiation is preserved. There is mild generalized atrophy. There is no bony abnormality. Visible paranasal sinuses are clear.  IMPRESSION: Remote right caudate and basal ganglia infarction. Mild generalized atrophy. No acute findings.   Electronically Signed   By: Andreas Newport M.D.   On: 09/02/2014 23:27   US Scrotum  09/04/2014   CLINICAL DATA:  Right testicular pain, status post inguinal hernia repair  EXAM: SCROTAL ULTRASOUND  DOPPLER ULTRASOUND OF THE TESTICLES  TECHNIQUE: Complete ultrasound examination of the testicles, epididymis, and other scrotal structures was performed. Color and spectral Doppler ultrasound were also utilized to evaluate blood flow to the testicles.  COMPARISON:  None.  FINDINGS: Right testicle  Measurements: 4.2 x 2.7 x 3.2 cm. No mass or microlithiasis visualized.  Left testicle  Measurements: 4.2 x 2.5 x 3.1 cm. No mass or microlithiasis visualized.  Right epididymis:  Normal in size and appearance.  Left epididymis:  Normal in size and appearance.  Hydrocele: Small complex right hydrocele with debris. Physiologic fluid on the left.  Varicocele:  Present bilaterally.  Pulsed Doppler interrogation of both testes demonstrates normal low resistance arterial and venous waveforms bilaterally.  IMPRESSION: Normal sonographic appearance of the bilateral testes.  No evidence of testicular torsion.  Small complex right hydrocele with debris.   Bilateral varicoceles.   Electronically Signed   By: Julian Hy M.D.   On: 09/04/2014 18:25   [place] Art/ven Flow Abd Pelv Doppler  09/04/2014   CLINICAL DATA:  Right testicular pain, status post inguinal hernia repair  EXAM: SCROTAL ULTRASOUND  DOPPLER ULTRASOUND OF THE TESTICLES  TECHNIQUE: Complete ultrasound examination of the testicles, epididymis, and other scrotal structures was performed. Color and spectral Doppler ultrasound were also utilized to evaluate blood flow to the testicles.  COMPARISON:  None.  FINDINGS: Right testicle  Measurements: 4.2 x 2.7 x 3.2 cm. No mass or microlithiasis visualized.  Left testicle  Measurements: 4.2 x 2.5 x 3.1 cm. No mass or microlithiasis visualized.  Right epididymis:  Normal in size and appearance.  Left epididymis:  Normal in size and appearance.  Hydrocele: Small complex right hydrocele with debris. Physiologic fluid on the left.  Varicocele:  Present bilaterally.  Pulsed Doppler interrogation of both testes demonstrates normal low resistance arterial and venous waveforms bilaterally.  IMPRESSION: Normal sonographic appearance of the bilateral testes.  No evidence of testicular torsion.  Small complex right hydrocele with debris.  Bilateral varicoceles.   Electronically Signed   By: Julian Hy M.D.   On: 09/04/2014 18:25   Dg Chest Port 1 View  09/04/2014   CLINICAL DATA:  Community acquired pneumonia. There degree heart block. Central line placement.  EXAM: PORTABLE CHEST - 1 VIEW  COMPARISON:  09/02/2014  FINDINGS: Mild cardiomegaly remains stable. Pacemaker remains in appropriate position. A new right jugular central venous catheter is seen with tip overlying the distal SVC. No evidence of pneumothorax.  Right lower lung airspace disease is again seen, without significant change allowing for differences in radiographic technique. Left lung remains clear. No definite pleural effusion seen on this portable exam.  IMPRESSION: New right internal  jugular central venous catheter in appropriate position. No pneumothorax visualized.  Right lower lung airspace disease, without significant change.   Electronically Signed   By: Earle Gell M.D.   On: 09/04/2014 14:38    PHYSICAL EXAM General: NAD Neck: JVP 7 cm. No thyromegaly or thyroid nodule.  Lungs: Crackles right base. CV: Nondisplaced PMI.  Heart regular S1/S2, no S3/S4, no murmur. No edema.  No carotid bruit.  Normal pedal pulses.  Abdomen: Soft, nontender, no hepatosplenomegaly, no distention.  Neurologic: Alert and oriented x 3.  Psych: Normal affect. Extremities: No clubbing or cyanosis.   TELEMETRY: Reviewed telemetry pt in NSR with V-pacing  ASSESSMENT AND PLAN: 64 yo with h/o PPM for CHB, cardiomyopathy of uncertain etiology, and atrial tachycardia presented with RLL PNA, atrial flutter, and acute on chronic systolic CHF.  1. Acute on chronic systolic CHF: EF 60% on echo this admission, persistently low.  Has CHB and paces RV. Etiology of cardiomyopathy is not certain.  Possibly related to RV pacing.  However, we have not ruled out CAD => had Cardiolite in 6/15 with scar but no ischemia (?true infarct versus artifact).  No BMET yet today. Co-ox low while in atrial flutter but improved with NSR.  He is not volume overloaded now.   - Home on Lasix 40 qam/20 qpm (incrementally higher than prior).  - Continue Bidil 0.5 tab tid, lisinopril 2.5 qhs, and Coreg 3.125 mg bid   - Discussed with EP.  Plan to wait 1 month after this hospitalization given conversion to NSR on amiodarone and then will do cardiac cath.  If cath shows no revascularizable disease, he will then need upgrade to CRT-D given RV pacing and persistently low EF.   2. Atrial flutter: Patient has history of atrial tachycardia.  This admission, came in with atrial flutter.  Now s/p atrial flutter ablation.  Continue Xarelto for now, will need to discuss long-term anticoagulation plan with EP.  3. Possible RLL PNA: Improved  on antibiotics.   4. CKD: Creatinine stable.    5. 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  6. Hopefully home 8/4. Will arrange for followup in CHF clinic in 2 wks, will need cardiac cath in 1 month.   Loralie Champagne 09/08/2014 1:46 PM

## 2014-09-09 ENCOUNTER — Telehealth: Payer: Self-pay | Admitting: Family Medicine

## 2014-09-09 MED ORDER — HYDRALAZINE HCL 10 MG PO TABS: 20.0000 mg | ORAL_TABLET | Freq: Three times a day (TID) | ORAL | Status: DC

## 2014-09-09 MED ORDER — ISOSORBIDE DINITRATE 10 MG PO TABS: 10.0000 mg | ORAL_TABLET | Freq: Three times a day (TID) | ORAL | Status: DC

## 2014-09-09 NOTE — Telephone Encounter (Signed)
Family Medicine After hours phone call  Patient called b/c he states he is unable to afford the medication prescribed to him by his PCP. Patient was prescribed Bidil earlier this week. He is asking for a cheaper alternative.  New scripts for Isosorbide dinitrate and hydralazine were placed independently. Dosing was made as close to the combination as possible (w/ marginally larger dose of hydralazine per dose; 2mg /dose).  Dosing discussed w/ patient. Patient stated his understanding. He was asked to call back if any questions or complications occur.   Elberta Leatherwood, MD,MS,  PGY2 09/09/2014 5:50 PM

## 2014-09-13 ENCOUNTER — Inpatient Hospital Stay: Payer: BC Managed Care – PPO | Admitting: Family Medicine

## 2014-09-14 ENCOUNTER — Encounter: Payer: Self-pay | Admitting: Family Medicine

## 2014-09-14 ENCOUNTER — Ambulatory Visit (INDEPENDENT_AMBULATORY_CARE_PROVIDER_SITE_OTHER): Payer: BC Managed Care – PPO | Admitting: Family Medicine

## 2014-09-14 VITALS — BP 87/49 | HR 78 | Temp 98.1°F | Ht 73.0 in | Wt 188.8 lb

## 2014-09-14 DIAGNOSIS — Z09 Encounter for follow-up examination after completed treatment for conditions other than malignant neoplasm: Secondary | ICD-10-CM

## 2014-09-14 NOTE — Patient Instructions (Addendum)
Thank you for coming to see me today. It was a pleasure. Today we talked about:   Pneumonia: I'm glad you are doing better. No further treatment necessary  Blood pressure: your blood pressure was a little low. If you start having lightheadedness/dizziness, please return as we may need to have your medications adjusted. Otherwise, please follow-up with cardiology as scheduled.  Please make an appointment to see Dr. Gerlean Ren in 4 weeks for follow-up.  If you have any questions or concerns, please do not hesitate to call the office at [phone].  Sincerely,  Cordelia Poche, MD

## 2014-09-14 NOTE — Progress Notes (Signed)
    Subjective   Philip Chavez is a 64 y.o. male that presents for a hospital follow-up visit  1. Hospital follow-up: Patient admitted for CAP and chest pain rule out. He was found to have atrial flutter and reduced heart function. He has completed his antibiotic course. No chest pain, shortness of breath, he sleeps on two pillows but states it is due to comfort, and no PND. He reports no lightheadedness/dizziness.  ROS Per HPI  Past Medical History  Diagnosis Date  . DVT (deep venous thrombosis)     a. 2007 RLE: S/P ankle surgery;  b. 03/2013 LLE DVT & PE-->Xarelto.  . Embolism, pulmonary with infarction     a. 2007 RLE: S/P ankle surgery;  b. 03/2013 LLE DVT & PE-->Xarelto.  . Third degree heart block     a. 03/2013 s/p MDT ADDRL1 Adapta DC PPM, ser # [id number] H.  . Hypercoagulable state   . Musculoskeletal neck pain   . Hypertension   . Presence of permanent cardiac pacemaker     a. 03/2013 s/p MDT ADDRL1 Adapta DC PPM, ser # [id number] H.  . Hypercholesterolemia   . Chronic combined systolic and diastolic CHF (congestive heart failure)     a. 03/2013 Echo EF 50-55%;  b. 07/2013 Echo EF 20-25%, diff HK, Gr3 DD;  c. 08/2014 Echo: EF 15%, diff HK, mild AI/MR, mildly dil LA/RV, mod TR, PASP 65mmHg.  . Sleep apnea     does not wear mask (09/02/2014)  . Pneumonia ~ 04/2014; 09/02/2014  . Nonischemic cardiomyopathy     a. 07/2013 Echo: EF 20-25%;  b. 07/2013 Myoview: Large inferior, lateral, apical scar w/ HK, no ischemia, EF 17%;  b. 08/2014 Echo: EF 15%, diff HK.  Marland Kitchen Atrial tachycardia   . Atrial flutter     a. 08/2014 presented w/ aflutter->converted on amio;  b. CHA2DS2VASc = 2 -->Xarelto.    Social History  Substance Use Topics  . Smoking status: Former Smoker -- 1.50 packs/day for 10 years    Types: Cigarettes  . Smokeless tobacco: Never Used     Comment: 09/02/2014  "quit smoking 20-30 yr ago"  . Alcohol Use: No    No Known Allergies  Objective   BP 87/49 mmHg  Pulse 78   Temp(Src) 98.1 F (36.7 C) (Oral)  Ht 6\' 1"  (1.854 m)  Wt 188 lb 12.8 oz (85.639 kg)  BMI 24.91 kg/m2  General: Well appearing, no distress Respiratory/Chest: Clear to auscultation bilaterally, no wheezing or diminished breath sounds bilaterally Cardiovascular: Regular rate and rhythm, distant heart sounds, no murmur  Assessment and Plan   No orders of the defined types were placed in this encounter.    Hospital follow-up: appears to be doing well since discharge. No concerns for heart failure exacerbation. Pneumonia appears to be clear. Blood pressure on low side today probably secondary to pharmacotherapy, however, patient is asymptomatic and prefers to have cardiology adjust his medications  Follow-up with PCP  Follow-up with cardiology  Return precautions

## 2014-09-21 ENCOUNTER — Other Ambulatory Visit: Payer: Self-pay | Admitting: *Deleted

## 2014-09-21 ENCOUNTER — Encounter (HOSPITAL_COMMUNITY): Payer: Self-pay | Admitting: *Deleted

## 2014-09-21 ENCOUNTER — Encounter (HOSPITAL_COMMUNITY): Payer: Self-pay

## 2014-09-21 ENCOUNTER — Ambulatory Visit (HOSPITAL_COMMUNITY)
Admit: 2014-09-21 | Discharge: 2014-09-21 | Disposition: A | Discharge status: Home / Self Care | Payer: BC Managed Care – PPO | Source: Ambulatory Visit | Attending: Internal Medicine | Admitting: Internal Medicine

## 2014-09-21 VITALS — BP 102/60 | HR 77 | Wt 195.0 lb

## 2014-09-21 DIAGNOSIS — Z950 Presence of cardiac pacemaker: Secondary | ICD-10-CM | POA: Diagnosis not present

## 2014-09-21 DIAGNOSIS — Z8249 Family history of ischemic heart disease and other diseases of the circulatory system: Secondary | ICD-10-CM | POA: Diagnosis not present

## 2014-09-21 DIAGNOSIS — I442 Atrioventricular block, complete: Secondary | ICD-10-CM

## 2014-09-21 DIAGNOSIS — I5022 Chronic systolic (congestive) heart failure: Secondary | ICD-10-CM | POA: Diagnosis not present

## 2014-09-21 DIAGNOSIS — Z86718 Personal history of other venous thrombosis and embolism: Secondary | ICD-10-CM | POA: Insufficient documentation

## 2014-09-21 DIAGNOSIS — E785 Hyperlipidemia, unspecified: Secondary | ICD-10-CM | POA: Insufficient documentation

## 2014-09-21 DIAGNOSIS — I4892 Unspecified atrial flutter: Secondary | ICD-10-CM | POA: Insufficient documentation

## 2014-09-21 DIAGNOSIS — I5042 Chronic combined systolic (congestive) and diastolic (congestive) heart failure: Secondary | ICD-10-CM

## 2014-09-21 DIAGNOSIS — Z7902 Long term (current) use of antithrombotics/antiplatelets: Secondary | ICD-10-CM | POA: Insufficient documentation

## 2014-09-21 DIAGNOSIS — G4733 Obstructive sleep apnea (adult) (pediatric): Secondary | ICD-10-CM | POA: Diagnosis not present

## 2014-09-21 DIAGNOSIS — I429 Cardiomyopathy, unspecified: Secondary | ICD-10-CM | POA: Diagnosis not present

## 2014-09-21 DIAGNOSIS — N189 Chronic kidney disease, unspecified: Secondary | ICD-10-CM | POA: Insufficient documentation

## 2014-09-21 DIAGNOSIS — R7989 Other specified abnormal findings of blood chemistry: Secondary | ICD-10-CM | POA: Diagnosis not present

## 2014-09-21 DIAGNOSIS — Z79899 Other long term (current) drug therapy: Secondary | ICD-10-CM | POA: Insufficient documentation

## 2014-09-21 DIAGNOSIS — Z86711 Personal history of pulmonary embolism: Secondary | ICD-10-CM | POA: Diagnosis not present

## 2014-09-21 LAB — LIPID PANEL
CHOLESTEROL: 178 mg/dL (ref 0–200)
HDL: 68 mg/dL (ref >40)
LDL Cholesterol: 96 mg/dL (ref 0–99)
Total CHOL/HDL Ratio: 2.6 RATIO
Triglycerides: 70 mg/dL (ref <150)
VLDL: 14 mg/dL (ref 0–40)

## 2014-09-21 LAB — BASIC METABOLIC PANEL
Anion gap: 7 (ref 5–15)
BUN: 12 mg/dL (ref 6–20)
CALCIUM: 9 mg/dL (ref 8.9–10.3)
CO2: 26 mmol/L (ref 22–32)
Chloride: 105 mmol/L (ref 101–111)
Creatinine, Ser: 1.15 mg/dL (ref 0.61–1.24)
Glucose, Bld: 231 mg/dL — ABNORMAL HIGH (ref 65–99)
POTASSIUM: 3.5 mmol/L (ref 3.5–5.1)
Sodium: 138 mmol/L (ref 135–145)

## 2014-09-21 LAB — DIGOXIN LEVEL: Digoxin Level: 0.7 ng/mL — ABNORMAL LOW (ref 0.8–2.0)

## 2014-09-21 LAB — BRAIN NATRIURETIC PEPTIDE: B Natriuretic Peptide: 689.6 pg/mL — ABNORMAL HIGH (ref 0.0–100.0)

## 2014-09-21 MED ORDER — SPIRONOLACTONE 25 MG PO TABS: 25.0000 mg | ORAL_TABLET | Freq: Every day | ORAL | Status: DC

## 2014-09-21 NOTE — Progress Notes (Signed)
Patient ID: Philip Chavez, male   DOB: 08-Apr-1950, 64 y.o.   MRN: [id number] Primary cardiologist: Dr. Lovena Le HF cardiologist: Dr Aundra Dubin  64 yo with history of complete heart block/Medtronic PPM, cardiomyopathy, and atrial flutter s/p ablation presents for CHF clinic evaluation.  Patient developed complete heart block in 2/15 and had PPM placed at that time.  He paces his RV continuously.  In 2/15, EF was 50-55% by echo.  By 6/15, EF had fallen to 20-25%.  Cardiolite showed possible scar but no ischemia.  On 09/03/14, he was admitted with dyspnea and found to be in atrial flutter with RLL PNA and with volume overload.  Echo showed EF 15% with diffuse hypokinesis.  He was started on IV Lasix and IV amiodarone. He converted back to NSR.  He subsequently had atrial flutter ablation.    Since discharge from the hospital, he has been doing well.  He feels better overall.  He is dyspneic only with "heavy work."  He can walk on flat ground and walk up steps without difficulty.  No orthopnea/PND.  No chest pain.  No lightheadedness/syncope, no palpitations.  He remains in NSR.   ECG: NSR with v-pacing  Labs (8/16): K 3.9, creatinine 1.03, AST 106, ALT 173, HCT 45.1  PMH: 1. H/o complete heart block: Has Medtronic PPM, placed in 2/15.   2. Cardiomyopathy: Echo (2/15) prior to PPM placement with EF 50-55%.  Echo (6/15) with EF 20-25%,  Cardiolite at that time showed possible scar but no ischemia.  Echo (8/16) with EF 15%, diffuse hypokinesis, mildly dilated RV with normal systolic function, PA systolic pressure 69 mmHg.  3. CKD 4. 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  5. Atrial tachycardia: paroxysmal. 6. Atrial flutter: s/p ablation in 8/16.  7. OSA 8. Hyperlipidemia  FH: Father with CHF, complete heart block with PPM.  Sister with complete heart block and PPM.   SH: Retired Curator, nonsmoker, lives in [place]: All systems reviewed and negative except as per HPI  Current Outpatient  Prescriptions  Medication Sig Dispense Refill  . calcium carbonate (TUMS - DOSED IN MG ELEMENTAL CALCIUM) 500 MG chewable tablet Chew 1 tablet by mouth as needed for indigestion or heartburn.    . carvedilol (COREG) 3.125 MG tablet Take 1 tablet (3.125 mg total) by mouth 2 (two) times daily with a meal. 60 tablet 0  . digoxin (LANOXIN) 0.125 MG tablet Take 1 tablet (0.125 mg total) by mouth daily. 30 tablet 0  . furosemide (LASIX) 20 MG tablet Take two tablets in the morning and 1 tablet at night 90 tablet 0  . isosorbide-hydrALAZINE (BIDIL) 20-37.5 MG per tablet Take 0.5 tablets by mouth 3 (three) times daily.    Marland Kitchen lisinopril (PRINIVIL,ZESTRIL) 5 MG tablet Take 0.5 tablets (2.5 mg total) by mouth daily. 90 tablet 3  . rivaroxaban (XARELTO) 20 MG TABS tablet Take 1 tablet (20 mg total) by mouth daily with supper. 30 tablet 6  . simvastatin (ZOCOR) 20 MG tablet Take 1 tablet (20 mg total) by mouth daily at 6 PM. 90 tablet 3  . spironolactone (ALDACTONE) 25 MG tablet Take 1 tablet (25 mg total) by mouth daily. 30 tablet 3   No current facility-administered medications for this encounter.   BP 102/60 mmHg  Pulse 77  Wt 195 lb (88.451 kg)  SpO2 99% General: NAD Neck: No JVD, no thyromegaly or thyroid nodule.  Lungs: Clear to auscultation bilaterally with normal respiratory effort. CV: Nondisplaced PMI.  Heart regular S1/S2, no S3/S4, no murmur.  No peripheral edema.  No carotid bruit.  Normal pedal pulses.  Abdomen: Soft, nontender, no hepatosplenomegaly, no distention.  Skin: Intact without lesions or rashes.  Neurologic: Alert and oriented x 3.  Psych: Normal affect. Extremities: No clubbing or cyanosis.  HEENT: Normal.   Assessment/Plan: 1. Chronic systolic CHF: EF 64% on last echo in 8/16.  Patient had borderline reduced EF when PPM was placed in 2/15.  Since then, EF has fallen significantly.  Patient has complete heart block.  Interestingly, father had CHF and CHB with pacemaker, and  sounds like sister also had CHB with pacemaker. I am concerned for a genetic dilated cardiomyopathy associated with CHB such as LMNA or SCN5A.  Alternatively, EF could have fallen because of chronic RV pacing.  Finally, we have not ruled out coronary disease as a contributor.  On exam today, he is not volume overloaded.  NYHA class II symptoms.  - Continue current Coreg, digoxin, Lasix, Bidil, and lisinopril.  - Increase spironolactone to 25 mg daily with BMET in 2 wks.  - Check BMET, BNP, digoxin level today.  - 1 month after atrial flutter ablation (after 9/3), I will hold Xarelto and plan LHC/RHC.  We need to rule out coronary disease as a contributor to the cardiomyopathy.  If there is no coronary disease in need of revascularization, next step will be upgrade to CRT-D device.  - Will need to consider genetic evaluation to look for LMNA or SCN5A mutations that could be used for familial screening.  2. Elevated LFTs: Noted in hospital.  Suspected hepatic congestion.  Need to repeat to make sure they are coming down.  3. Atrial flutter: s/p ablation.  He is on Xarelto and in NSR. Will need to continue Xarelto for a month post-ablation, then can stop.  4. PE/DVT: >1 year ago and associated with surgery.   Loralie Champagne 09/21/2014

## 2014-09-21 NOTE — Patient Instructions (Signed)
Increase Spironolactone to 25 mg daily  Labs today  Labs in 2 weeks  Heart Catheterization on Sept 8, see instruction sheet  Your physician recommends that you schedule a follow-up appointment in: 4-5 weeks

## 2014-09-28 ENCOUNTER — Other Ambulatory Visit: Payer: Self-pay | Admitting: Family Medicine

## 2014-09-29 ENCOUNTER — Other Ambulatory Visit: Payer: Self-pay | Admitting: Family Medicine

## 2014-09-29 NOTE — Telephone Encounter (Signed)
Needs metoprolol refilled and sent to [facility] in [facility]. Thank you, Fonda Kinder, ASA

## 2014-10-03 ENCOUNTER — Telehealth (HOSPITAL_COMMUNITY): Payer: Self-pay | Admitting: Cardiology

## 2014-10-03 NOTE — Telephone Encounter (Signed)
Patient is scheduled for R/L heart cath on 10/13/2014 with Dr.mcLean Cpt [phone] With patients current insurance- [employer]

## 2014-10-04 ENCOUNTER — Ambulatory Visit (INDEPENDENT_AMBULATORY_CARE_PROVIDER_SITE_OTHER): Payer: BC Managed Care – PPO | Admitting: Family Medicine

## 2014-10-04 ENCOUNTER — Encounter: Payer: Self-pay | Admitting: Family Medicine

## 2014-10-04 VITALS — BP 102/68 | HR 98 | Temp 98.4°F | Ht 73.0 in | Wt 195.0 lb

## 2014-10-04 DIAGNOSIS — I952 Hypotension due to drugs: Secondary | ICD-10-CM | POA: Diagnosis not present

## 2014-10-04 NOTE — Patient Instructions (Addendum)
Thank you so much for coming to visit me today! Your blood pressure is much better after stopping the Metoprolol. Please continue your medicines as prescribed. Please follow up with Cardiology as scheduled tomorrow. Please let us know if there's anything else we can do for you!  Dr. Gerlean Ren

## 2014-10-05 ENCOUNTER — Ambulatory Visit (HOSPITAL_COMMUNITY)
Admission: RE | Admit: 2014-10-05 | Discharge: 2014-10-05 | Disposition: A | Discharge status: Home / Self Care | Payer: BC Managed Care – PPO | Source: Ambulatory Visit | Attending: Internal Medicine | Admitting: Internal Medicine

## 2014-10-05 DIAGNOSIS — I5022 Chronic systolic (congestive) heart failure: Secondary | ICD-10-CM

## 2014-10-05 DIAGNOSIS — I5042 Chronic combined systolic (congestive) and diastolic (congestive) heart failure: Secondary | ICD-10-CM

## 2014-10-05 LAB — BASIC METABOLIC PANEL
ANION GAP: 8 (ref 5–15)
BUN: 12 mg/dL (ref 6–20)
CALCIUM: 9.2 mg/dL (ref 8.9–10.3)
CHLORIDE: 104 mmol/L (ref 101–111)
CO2: 25 mmol/L (ref 22–32)
CREATININE: 1.21 mg/dL (ref 0.61–1.24)
GFR calc non Af Amer: >60 mL/min (ref >60)
Glucose, Bld: 109 mg/dL — ABNORMAL HIGH (ref 65–99)
Potassium: 3.8 mmol/L (ref 3.5–5.1)
Sodium: 137 mmol/L (ref 135–145)

## 2014-10-05 LAB — CBC
HCT: 44.3 % (ref 39.0–52.0)
HEMOGLOBIN: 15.1 g/dL (ref 13.0–17.0)
MCH: 32 pg (ref 26.0–34.0)
MCHC: 34.1 g/dL (ref 30.0–36.0)
MCV: 93.9 fL (ref 78.0–100.0)
Platelets: 163 10*3/uL (ref 150–400)
RBC: 4.72 MIL/uL (ref 4.22–5.81)
RDW: 14 % (ref 11.5–15.5)
WBC: 5 10*3/uL (ref 4.0–10.5)

## 2014-10-05 LAB — PROTIME-INR
INR: 1.67 — ABNORMAL HIGH (ref 0.00–1.49)
PROTHROMBIN TIME: 19.7 s — AB (ref 11.6–15.2)

## 2014-10-05 LAB — LIPID PANEL
CHOLESTEROL: 216 mg/dL — AB (ref 0–200)
HDL: 74 mg/dL (ref >40)
LDL Cholesterol: 132 mg/dL — ABNORMAL HIGH (ref 0–99)
TRIGLYCERIDES: 52 mg/dL (ref <150)
Total CHOL/HDL Ratio: 2.9 RATIO
VLDL: 10 mg/dL (ref 0–40)

## 2014-10-06 ENCOUNTER — Other Ambulatory Visit: Payer: Self-pay | Admitting: Internal Medicine

## 2014-10-07 NOTE — Progress Notes (Signed)
Subjective:     Patient ID: Philip Chavez, male   DOB: [birth year], 64 y.o.   MRN: [id number]  HPI Philip Chavez is a 64yo male presenting today to re-evaluate his blood pressure.  - BP noted to be low at last office visit on 8/10 at 87/49 - Reports he has ran out of his Metoprolol and needs a refill - Chart review shows Metoprolol was discontinued at discharge from hospital. Philip Chavez has continued to take his Metoprolol in addition to his prescribed antihypertensives, which include Coreg - Ran out of Metoprolol a few days ago - States he feels much better since discharge from hospital - Denies fatigue, dizziness, chest pain, shortness of breath, orthopnea, paroxysmal nocturnal dyspnea - States he has follow up with Cardiology, who are planning catheterization   Review of Systems Per HPI    Objective:   Physical Exam  Constitutional: He appears well-developed and well-nourished. No distress.  Cardiovascular: Normal rate and regular rhythm.  Exam reveals no gallop and no friction rub.   No murmur heard. Pulmonary/Chest: Effort normal. No respiratory distress. He has no wheezes. He has no rales.  Musculoskeletal: He exhibits no edema.       Assessment and Plan:     Hypotension - BP noted to be 87/49 at last office visit on 8/10 - Improved to 101/51 today. Rechecked at end of office visit and remained stable - Discussed that Metoprolol was discontinued at discharge and he should not take this medication. Will not refill today. Suspect hypotension at last office visit was due to taking Metoprolol in addition to his prescribed antihypertensives - Follow up with Cardiology as scheduled

## 2014-10-09 ENCOUNTER — Other Ambulatory Visit: Payer: Self-pay | Admitting: Family Medicine

## 2014-10-09 ENCOUNTER — Other Ambulatory Visit: Payer: Self-pay | Admitting: Internal Medicine

## 2014-10-09 DIAGNOSIS — I959 Hypotension, unspecified: Secondary | ICD-10-CM | POA: Insufficient documentation

## 2014-10-09 NOTE — Assessment & Plan Note (Signed)
-   BP noted to be 87/49 at last office visit on 8/10 - Improved to 101/51 today. Rechecked at end of office visit and remained stable - Discussed that Metoprolol was discontinued at discharge and he should not take this medication. Will not refill today. Suspect hypotension at last office visit was due to taking Metoprolol in addition to his prescribed antihypertensives - Follow up with Cardiology as scheduled

## 2014-10-11 ENCOUNTER — Telehealth (HOSPITAL_COMMUNITY): Payer: Self-pay

## 2014-10-11 DIAGNOSIS — E785 Hyperlipidemia, unspecified: Secondary | ICD-10-CM

## 2014-10-11 MED ORDER — SIMVASTATIN 40 MG PO TABS: 40.0000 mg | ORAL_TABLET | Freq: Every day | ORAL | Status: DC

## 2014-10-11 NOTE — Telephone Encounter (Signed)
Recent lab results reviewed with patient.  Per Dr. Aundra Dubin, increase Simvastatin to 40 mg once daily.  Aware and agreeable.  Rx updated to preferred pharmacy electronically.

## 2014-10-12 ENCOUNTER — Encounter: Payer: Self-pay | Admitting: *Deleted

## 2014-10-12 ENCOUNTER — Encounter: Payer: Self-pay | Admitting: Internal Medicine

## 2014-10-12 ENCOUNTER — Ambulatory Visit (INDEPENDENT_AMBULATORY_CARE_PROVIDER_SITE_OTHER): Payer: BC Managed Care – PPO | Admitting: Internal Medicine

## 2014-10-12 VITALS — BP 100/64 | HR 84 | Ht 73.0 in | Wt 197.0 lb

## 2014-10-12 DIAGNOSIS — Z950 Presence of cardiac pacemaker: Secondary | ICD-10-CM | POA: Diagnosis not present

## 2014-10-12 DIAGNOSIS — Z01812 Encounter for preprocedural laboratory examination: Secondary | ICD-10-CM | POA: Diagnosis not present

## 2014-10-12 DIAGNOSIS — I5042 Chronic combined systolic (congestive) and diastolic (congestive) heart failure: Secondary | ICD-10-CM | POA: Diagnosis not present

## 2014-10-12 DIAGNOSIS — I4892 Unspecified atrial flutter: Secondary | ICD-10-CM

## 2014-10-12 MED ORDER — DIGOXIN 125 MCG PO TABS: 125.0000 ug | ORAL_TABLET | Freq: Every day | ORAL | Status: DC

## 2014-10-12 MED ORDER — HYDRALAZINE HCL 10 MG PO TABS: ORAL_TABLET | ORAL | Status: DC

## 2014-10-12 MED ORDER — CARVEDILOL 3.125 MG PO TABS: 3.1250 mg | ORAL_TABLET | Freq: Two times a day (BID) | ORAL | Status: DC

## 2014-10-12 MED ORDER — ISOSORBIDE DINITRATE 10 MG PO TABS: 10.0000 mg | ORAL_TABLET | Freq: Three times a day (TID) | ORAL | Status: DC

## 2014-10-12 NOTE — Assessment & Plan Note (Signed)
He is s/p cathter ablation. He will continue his current meds. No recurrent arrhythmias. Will plan to hold xarelto prior to his procedure and will likely not need to restart.

## 2014-10-12 NOTE — Progress Notes (Signed)
HPI Philip Chavez returns today for followup. He is a 64 yo man with a h/o complete heart block and HTN, s/p PPM insertion. He has developed LV dysfunction. He had atrial flutter and underwent ablation. He has not had syncope. His EF has been less than 30% for over a year. He has been on maximal medical therapy. His heart failure is class 2. 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 No Known Allergies   Current Outpatient Prescriptions  Medication Sig Dispense Refill  . aspirin 325 MG tablet Take 325 mg by mouth daily.    . calcium carbonate (TUMS - DOSED IN MG ELEMENTAL CALCIUM) 500 MG chewable tablet Chew 1 tablet by mouth as needed for indigestion or heartburn.    . carvedilol (COREG) 3.125 MG tablet Take 1 tablet (3.125 mg total) by mouth 2 (two) times daily with a meal. 60 tablet 12  . digoxin (LANOXIN) 0.125 MG tablet Take 1 tablet (125 mcg total) by mouth daily. 30 tablet 12  . furosemide (LASIX) 20 MG tablet Take two tablets in the morning and 1 tablet at night 90 tablet 0  . hydrALAZINE (APRESOLINE) 10 MG tablet TAKE TWO TABLETS BY MOUTH THREE TIMES DAILY. 90 tablet 12  . isosorbide dinitrate (ISORDIL) 10 MG tablet Take 1 tablet (10 mg total) by mouth 3 (three) times daily. 90 tablet 12  . lisinopril (PRINIVIL,ZESTRIL) 5 MG tablet Take 0.5 tablets (2.5 mg total) by mouth daily. 90 tablet 3  . rivaroxaban (XARELTO) 20 MG TABS tablet Take 1 tablet (20 mg total) by mouth daily with supper. 30 tablet 6  . simvastatin (ZOCOR) 20 MG tablet Take 20 mg by mouth daily.    Marland Kitchen spironolactone (ALDACTONE) 25 MG tablet Take 1 tablet (25 mg total) by mouth daily. (Patient taking differently: Take 12.5 mg by mouth daily. ) 30 tablet 3   No current facility-administered medications for this visit.     Past Medical History  Diagnosis Date  . DVT (deep venous thrombosis)     a. 2007 RLE: S/P ankle surgery;  b. 03/2013 LLE DVT & PE-->Xarelto.  . Embolism, pulmonary with  infarction     a. 2007 RLE: S/P ankle surgery;  b. 03/2013 LLE DVT & PE-->Xarelto.  . Third degree heart block     a. 03/2013 s/p MDT ADDRL1 Adapta DC PPM, ser # [id number] H.  . Hypercoagulable state   . Musculoskeletal neck pain   . Hypertension   . Presence of permanent cardiac pacemaker     a. 03/2013 s/p MDT ADDRL1 Adapta DC PPM, ser # [id number] H.  . Hypercholesterolemia   . Chronic combined systolic and diastolic CHF (congestive heart failure)     a. 03/2013 Echo EF 50-55%;  b. 07/2013 Echo EF 20-25%, diff HK, Gr3 DD;  c. 08/2014 Echo: EF 15%, diff HK, mild AI/MR, mildly dil LA/RV, mod TR, PASP 3mmHg.  . Sleep apnea     does not wear mask (09/02/2014)  . Pneumonia ~ 04/2014; 09/02/2014  . Nonischemic cardiomyopathy     a. 07/2013 Echo: EF 20-25%;  b. 07/2013 Myoview: Large inferior, lateral, apical scar w/ HK, no ischemia, EF 17%;  b. 08/2014 Echo: EF 15%, diff HK.  Marland Kitchen Atrial tachycardia   . Atrial flutter     a. 08/2014 presented w/ aflutter->converted on amio;  b. CHA2DS2VASc = 2 -->Xarelto.    ROS:   All systems reviewed and negative except as noted in the HPI.  Past Surgical History  Procedure Laterality Date  . Foot fracture surgery Right 2007  . Permanent pacemaker insertion N/A 03/15/2013    Procedure: PERMANENT PACEMAKER INSERTION;  Surgeon: Evans Lance, MD;  Location: [facility] CATH LAB;  Service: Cardiovascular;  Laterality: N/A;  . Inguinal hernia repair Right 1980's  . Insert / replace / remove pacemaker      MDT ADDRL1 pacemaker implanted by Dr Lovena Le for complete heart block  . Fracture surgery    . Electrophysiologic study N/A 09/07/2014    Procedure: A-Flutter;  Surgeon: Evans Lance, MD;  Location: [facility] CV LAB;  Service: Cardiovascular;  Laterality: N/A;     Family History  Problem Relation Age of Onset  . Diabetes type II Mother   . Heart disease Father      Social History   Social History  . Marital Status: Married    Spouse Name: N/A  . Number of  Children: N/A  . Years of Education: N/A   Occupational History  . Not on file.   Social History Main Topics  . Smoking status: Former Smoker -- 1.50 packs/day for 10 years    Types: Cigarettes  . Smokeless tobacco: Never Used     Comment: 09/02/2014  "quit smoking 20-30 yr ago"  . Alcohol Use: No  . Drug Use: No  . Sexual Activity: Yes   Other Topics Concern  . Not on file   Social History Narrative     BP 100/64 mmHg  Pulse 84  Ht 6\' 1"  (1.854 m)  Wt 197 lb (89.359 kg)  BMI 26.00 kg/m2  Physical Exam:  Well appearing middle aged man,NAD HEENT: Unremarkable Neck:  7 cm JVD, no thyromegally Back:  No CVA tenderness Lungs:  Clear with no wheezes, well healed PPM incision. HEART:  Regular rate rhythm, no murmurs, no rubs, no clicks, soft S3., LV heave Abd:  soft, positive bowel sounds, no organomegally, no rebound, no guarding Ext:  2 plus pulses, no edema, no cyanosis, no clubbing Skin:  No rashes no nodules Neuro:  CN II through XII intact, motor grossly intact   DEVICE  Normal device function.  See PaceArt for details.   Assess/Plan: All all

## 2014-10-12 NOTE — Patient Instructions (Addendum)
Medication Instructions:  Your physician recommends that you continue on your current medications as directed. Please refer to the Current Medication list given to you today.  Take your last dose of Xarelto on 10/21/14  Labwork: Your physician recommends that you return for lab work on 9/15 for pre-procedure lab exam.  Testing/Procedures: Your physician has recommended that you have BiV defibrillator uprage. An implantable cardioverter defibrillator (ICD) is a small device that is placed in your chest or, in rare cases, your abdomen. This device uses electrical pulses or shocks to help control life-threatening, irregular heartbeats that could lead the heart to suddenly stop beating (sudden cardiac arrest). Leads are attached to the ICD that goes into your heart. This is done in the hospital and usually requires an overnight stay. Please see the instruction sheet given to you today for more information.  Follow-Up: Your wound check is scheduled for 11/03/14  at 3:00 p.m. at [address]   Any Other Special Instructions Will Be Listed Below (If Applicable). Thank you for choosing [facility]!!

## 2014-10-12 NOTE — Assessment & Plan Note (Signed)
Interogation of his PPM demonstrates normal device function.

## 2014-10-12 NOTE — Assessment & Plan Note (Signed)
His symptoms are class 2 on maximal medical therapy. I have discussed the treatment options. He is willing to proceed with BiV ICD insertion with removal of his RV rate sense pacing lead. This will be scheduled in the coming weeks. No change in medical therapy.

## 2014-10-13 ENCOUNTER — Encounter (HOSPITAL_COMMUNITY): Admission: RE | Payer: Self-pay | Source: Ambulatory Visit

## 2014-10-13 ENCOUNTER — Ambulatory Visit (HOSPITAL_COMMUNITY): Admission: RE | Admit: 2014-10-13 | Payer: BC Managed Care – PPO | Source: Ambulatory Visit | Admitting: Cardiology

## 2014-10-13 ENCOUNTER — Telehealth (HOSPITAL_COMMUNITY): Payer: Self-pay | Admitting: *Deleted

## 2014-10-13 SURGERY — RIGHT/LEFT HEART CATH AND CORONARY ANGIOGRAPHY

## 2014-10-13 NOTE — Telephone Encounter (Signed)
Spoke with pt and confirmed his appt for his L&R heart cath  10/17/14 7:30am arrive at main entrance at 5:30am  Hold xarelto sat and sun restart Monday night after procedure

## 2014-10-17 ENCOUNTER — Encounter (HOSPITAL_COMMUNITY): Payer: Self-pay | Admitting: Cardiology

## 2014-10-17 ENCOUNTER — Encounter (HOSPITAL_COMMUNITY)
Admission: RE | Disposition: A | Discharge status: Home / Self Care | Payer: BC Managed Care – PPO | Source: Ambulatory Visit | Attending: Cardiology

## 2014-10-17 ENCOUNTER — Ambulatory Visit (HOSPITAL_COMMUNITY)
Admission: RE | Admit: 2014-10-17 | Discharge: 2014-10-17 | Disposition: A | Discharge status: Home / Self Care | Payer: BC Managed Care – PPO | Source: Ambulatory Visit | Attending: Cardiology | Admitting: Cardiology

## 2014-10-17 DIAGNOSIS — I442 Atrioventricular block, complete: Secondary | ICD-10-CM | POA: Insufficient documentation

## 2014-10-17 DIAGNOSIS — I428 Other cardiomyopathies: Secondary | ICD-10-CM | POA: Diagnosis not present

## 2014-10-17 DIAGNOSIS — E785 Hyperlipidemia, unspecified: Secondary | ICD-10-CM | POA: Insufficient documentation

## 2014-10-17 DIAGNOSIS — Z8249 Family history of ischemic heart disease and other diseases of the circulatory system: Secondary | ICD-10-CM | POA: Diagnosis not present

## 2014-10-17 DIAGNOSIS — I5022 Chronic systolic (congestive) heart failure: Secondary | ICD-10-CM | POA: Diagnosis not present

## 2014-10-17 DIAGNOSIS — N189 Chronic kidney disease, unspecified: Secondary | ICD-10-CM | POA: Diagnosis not present

## 2014-10-17 DIAGNOSIS — I4892 Unspecified atrial flutter: Secondary | ICD-10-CM | POA: Insufficient documentation

## 2014-10-17 DIAGNOSIS — Z86718 Personal history of other venous thrombosis and embolism: Secondary | ICD-10-CM | POA: Diagnosis not present

## 2014-10-17 DIAGNOSIS — Z79899 Other long term (current) drug therapy: Secondary | ICD-10-CM | POA: Diagnosis not present

## 2014-10-17 DIAGNOSIS — I5042 Chronic combined systolic (congestive) and diastolic (congestive) heart failure: Secondary | ICD-10-CM

## 2014-10-17 DIAGNOSIS — G4733 Obstructive sleep apnea (adult) (pediatric): Secondary | ICD-10-CM | POA: Diagnosis not present

## 2014-10-17 DIAGNOSIS — Z7901 Long term (current) use of anticoagulants: Secondary | ICD-10-CM | POA: Insufficient documentation

## 2014-10-17 DIAGNOSIS — I2510 Atherosclerotic heart disease of native coronary artery without angina pectoris: Secondary | ICD-10-CM | POA: Diagnosis not present

## 2014-10-17 DIAGNOSIS — Z950 Presence of cardiac pacemaker: Secondary | ICD-10-CM | POA: Diagnosis not present

## 2014-10-17 DIAGNOSIS — Z01812 Encounter for preprocedural laboratory examination: Secondary | ICD-10-CM

## 2014-10-17 DIAGNOSIS — I429 Cardiomyopathy, unspecified: Secondary | ICD-10-CM | POA: Diagnosis not present

## 2014-10-17 HISTORY — PX: CARDIAC CATHETERIZATION: SHX172

## 2014-10-17 LAB — POCT I-STAT 3, VENOUS BLOOD GAS (G3P V)
Acid-base deficit: 1 mmol/L (ref 0.0–2.0)
Acid-base deficit: 2 mmol/L (ref 0.0–2.0)
BICARBONATE: 24.6 meq/L — AB (ref 20.0–24.0)
Bicarbonate: 23.9 mEq/L (ref 20.0–24.0)
O2 Saturation: 63 %
O2 Saturation: 67 %
PCO2 VEN: 42.4 mmHg — AB (ref 45.0–50.0)
PCO2 VEN: 44.2 mmHg — AB (ref 45.0–50.0)
PH VEN: 7.353 — AB (ref 7.250–7.300)
PH VEN: 7.359 — AB (ref 7.250–7.300)
PO2 VEN: 34 mmHg (ref 30.0–45.0)
PO2 VEN: 37 mmHg (ref 30.0–45.0)
TCO2: 26 mmol/L (ref 0–100)
TCO2: 25 mmol/L (ref 0–100)

## 2014-10-17 SURGERY — RIGHT/LEFT HEART CATH AND CORONARY ANGIOGRAPHY
Anesthesia: LOCAL

## 2014-10-17 MED ORDER — IOHEXOL 350 MG/ML SOLN
INTRAVENOUS | Status: DC | PRN
  Administered 2014-10-17: 70 mL via INTRA_ARTERIAL

## 2014-10-17 MED ORDER — SODIUM CHLORIDE 0.9 % IJ SOLN: 3.0000 mL | INTRAMUSCULAR | Status: DC | PRN

## 2014-10-17 MED ORDER — SODIUM CHLORIDE 0.9 % IV SOLN
INTRAVENOUS | Status: DC
  Administered 2014-10-17: 07:00:00 via INTRAVENOUS

## 2014-10-17 MED ORDER — HEPARIN SODIUM (PORCINE) 1000 UNIT/ML IJ SOLN
INTRAMUSCULAR | Status: DC | PRN
  Administered 2014-10-17: 4000 [IU] via INTRAVENOUS

## 2014-10-17 MED ORDER — HEPARIN (PORCINE) IN NACL 2-0.9 UNIT/ML-% IJ SOLN
INTRAMUSCULAR | Status: DC | PRN
  Administered 2014-10-17: 08:00:00 via INTRA_ARTERIAL

## 2014-10-17 MED ORDER — ONDANSETRON HCL 4 MG/2ML IJ SOLN: 4.0000 mg | Freq: Four times a day (QID) | INTRAMUSCULAR | Status: DC | PRN

## 2014-10-17 MED ORDER — MIDAZOLAM HCL 2 MG/2ML IJ SOLN
INTRAMUSCULAR | Status: DC | PRN
  Administered 2014-10-17: 1 mg via INTRAVENOUS

## 2014-10-17 MED ORDER — SODIUM CHLORIDE 0.9 % IJ SOLN: 3.0000 mL | Freq: Two times a day (BID) | INTRAMUSCULAR | Status: DC

## 2014-10-17 MED ORDER — HEPARIN (PORCINE) IN NACL 2-0.9 UNIT/ML-% IJ SOLN
INTRAMUSCULAR | Status: AC
  Filled 2014-10-17: qty 1000

## 2014-10-17 MED ORDER — FENTANYL CITRATE (PF) 100 MCG/2ML IJ SOLN
INTRAMUSCULAR | Status: DC | PRN
  Administered 2014-10-17: 25 ug via INTRAVENOUS

## 2014-10-17 MED ORDER — ACETAMINOPHEN 325 MG PO TABS: 650.0000 mg | ORAL_TABLET | ORAL | Status: DC | PRN

## 2014-10-17 MED ORDER — NITROGLYCERIN 1 MG/10 ML FOR IR/CATH LAB
INTRA_ARTERIAL | Status: AC
  Filled 2014-10-17: qty 10

## 2014-10-17 MED ORDER — LIDOCAINE HCL (PF) 1 % IJ SOLN
INTRAMUSCULAR | Status: DC | PRN
  Administered 2014-10-17: 09:00:00

## 2014-10-17 MED ORDER — LIDOCAINE HCL (PF) 1 % IJ SOLN
INTRAMUSCULAR | Status: AC
  Filled 2014-10-17: qty 30

## 2014-10-17 MED ORDER — MIDAZOLAM HCL 2 MG/2ML IJ SOLN
INTRAMUSCULAR | Status: AC
  Filled 2014-10-17: qty 4

## 2014-10-17 MED ORDER — SODIUM CHLORIDE 0.9 % WEIGHT BASED INFUSION: 1.0000 mL/kg/h | INTRAVENOUS | Status: AC

## 2014-10-17 MED ORDER — VERAPAMIL HCL 2.5 MG/ML IV SOLN
INTRAVENOUS | Status: AC
  Filled 2014-10-17: qty 2

## 2014-10-17 MED ORDER — SODIUM CHLORIDE 0.9 % IV SOLN: 250.0000 mL | INTRAVENOUS | Status: DC | PRN

## 2014-10-17 MED ORDER — HEPARIN SODIUM (PORCINE) 1000 UNIT/ML IJ SOLN
INTRAMUSCULAR | Status: AC
  Filled 2014-10-17: qty 1

## 2014-10-17 MED ORDER — ASPIRIN 81 MG PO CHEW
81.0000 mg | CHEWABLE_TABLET | ORAL | Status: AC
Start: 2014-10-17 — End: 2014-10-17
  Administered 2014-10-17: 81 mg via ORAL

## 2014-10-17 MED ORDER — ASPIRIN 81 MG PO CHEW
CHEWABLE_TABLET | ORAL | Status: AC
  Filled 2014-10-17: qty 1

## 2014-10-17 MED ORDER — FENTANYL CITRATE (PF) 100 MCG/2ML IJ SOLN
INTRAMUSCULAR | Status: AC
  Filled 2014-10-17: qty 4

## 2014-10-17 SURGICAL SUPPLY — 12 items
CATH BALLN WEDGE 5F 110CM (CATHETERS) ×2 IMPLANT
CATH INFINITI 5 FR JL3.5 (CATHETERS) ×2 IMPLANT
CATH INFINITI JR4 5F (CATHETERS) ×2 IMPLANT
DEVICE RAD COMP TR BAND LRG (VASCULAR PRODUCTS) ×2 IMPLANT
GLIDESHEATH SLEND SS 6F .021 (SHEATH) ×2 IMPLANT
KIT HEART LEFT (KITS) ×2 IMPLANT
KIT HEART RIGHT NAMIC (KITS) ×2 IMPLANT
PACK CARDIAC CATHETERIZATION (CUSTOM PROCEDURE TRAY) ×2 IMPLANT
SHEATH FAST CATH BRACH 5F 5CM (SHEATH) ×2 IMPLANT
TRANSDUCER W/STOPCOCK (MISCELLANEOUS) ×3 IMPLANT
TUBING CIL FLEX 10 FLL-RA (TUBING) ×2 IMPLANT
WIRE SAFE-T 1.5MM-J .035X260CM (WIRE) ×2 IMPLANT

## 2014-10-17 NOTE — Interval H&P Note (Signed)
Cath Lab Visit (complete for each Cath Lab visit)  Clinical Evaluation Leading to the Procedure:   ACS: No.  Non-ACS:    Anginal Classification: CCS III  Anti-ischemic medical therapy: Minimal Therapy (1 class of medications)  Non-Invasive Test Results: No non-invasive testing performed  Prior CABG: No previous CABG      History and Physical Interval Note:  10/17/2014 7:58 AM  Philip Chavez  has presented today for surgery, with the diagnosis of sob, chf  The various methods of treatment have been discussed with the patient and family. After consideration of risks, benefits and other options for treatment, the patient has consented to  Procedure(s): Right/Left Heart Cath and Coronary Angiography (N/A) as a surgical intervention .  The patient's history has been reviewed, patient examined, no change in status, stable for surgery.  I have reviewed the patient's chart and labs.  Questions were answered to the patient's satisfaction.     Philip Chavez [employer]

## 2014-10-17 NOTE — H&P (View-Only) (Signed)
Patient ID: Philip Chavez, male   DOB: 03/06/1950, 63 y.o.   MRN: [id number] Primary cardiologist: Dr. Taylor HF cardiologist: Dr Winifred Bodiford  63 yo with history of complete heart block/Medtronic PPM, cardiomyopathy, and atrial flutter s/p ablation presents for CHF clinic evaluation.  Patient developed complete heart block in 2/15 and had PPM placed at that time.  He paces his RV continuously.  In 2/15, EF was 50-55% by echo.  By 6/15, EF had fallen to 20-25%.  Cardiolite showed possible scar but no ischemia.  On 09/03/14, he was admitted with dyspnea and found to be in atrial flutter with RLL PNA and with volume overload.  Echo showed EF 15% with diffuse hypokinesis.  He was started on IV Lasix and IV amiodarone. He converted back to NSR.  He subsequently had atrial flutter ablation.    Since discharge from the hospital, he has been doing well.  He feels better overall.  He is dyspneic only with "heavy work."  He can walk on flat ground and walk up steps without difficulty.  No orthopnea/PND.  No chest pain.  No lightheadedness/syncope, no palpitations.  He remains in NSR.   ECG: NSR with v-pacing  Labs (8/16): K 3.9, creatinine 1.03, AST 106, ALT 173, HCT 45.1  PMH: 1. H/o complete heart block: Has Medtronic PPM, placed in 2/15.   2. Cardiomyopathy: Echo (2/15) prior to PPM placement with EF 50-55%.  Echo (6/15) with EF 20-25%,  Cardiolite at that time showed possible scar but no ischemia.  Echo (8/16) with EF 15%, diffuse hypokinesis, mildly dilated RV with normal systolic function, PA systolic pressure 69 mmHg.  3. CKD 4. 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  5. Atrial tachycardia: paroxysmal. 6. Atrial flutter: s/p ablation in 8/16.  7. OSA 8. Hyperlipidemia  FH: Father with CHF, complete heart block with PPM.  Sister with complete heart block and PPM.   SH: Retired painter, nonsmoker, lives in [place]  ROS: All systems reviewed and negative except as per HPI  Current Outpatient  Prescriptions  Medication Sig Dispense Refill  . calcium carbonate (TUMS - DOSED IN MG ELEMENTAL CALCIUM) 500 MG chewable tablet Chew 1 tablet by mouth as needed for indigestion or heartburn.    . carvedilol (COREG) 3.125 MG tablet Take 1 tablet (3.125 mg total) by mouth 2 (two) times daily with a meal. 60 tablet 0  . digoxin (LANOXIN) 0.125 MG tablet Take 1 tablet (0.125 mg total) by mouth daily. 30 tablet 0  . furosemide (LASIX) 20 MG tablet Take two tablets in the morning and 1 tablet at night 90 tablet 0  . isosorbide-hydrALAZINE (BIDIL) 20-37.5 MG per tablet Take 0.5 tablets by mouth 3 (three) times daily.    . lisinopril (PRINIVIL,ZESTRIL) 5 MG tablet Take 0.5 tablets (2.5 mg total) by mouth daily. 90 tablet 3  . rivaroxaban (XARELTO) 20 MG TABS tablet Take 1 tablet (20 mg total) by mouth daily with supper. 30 tablet 6  . simvastatin (ZOCOR) 20 MG tablet Take 1 tablet (20 mg total) by mouth daily at 6 PM. 90 tablet 3  . spironolactone (ALDACTONE) 25 MG tablet Take 1 tablet (25 mg total) by mouth daily. 30 tablet 3   No current facility-administered medications for this encounter.   BP 102/60 mmHg  Pulse 77  Wt 195 lb (88.451 kg)  SpO2 99% General: NAD Neck: No JVD, no thyromegaly or thyroid nodule.  Lungs: Clear to auscultation bilaterally with normal respiratory effort. CV: Nondisplaced PMI.    Heart regular S1/S2, no S3/S4, no murmur.  No peripheral edema.  No carotid bruit.  Normal pedal pulses.  Abdomen: Soft, nontender, no hepatosplenomegaly, no distention.  Skin: Intact without lesions or rashes.  Neurologic: Alert and oriented x 3.  Psych: Normal affect. Extremities: No clubbing or cyanosis.  HEENT: Normal.   Assessment/Plan: 1. Chronic systolic CHF: EF 15% on last echo in 8/16.  Patient had borderline reduced EF when PPM was placed in 2/15.  Since then, EF has fallen significantly.  Patient has complete heart block.  Interestingly, father had CHF and CHB with pacemaker, and  sounds like sister also had CHB with pacemaker. I am concerned for a genetic dilated cardiomyopathy associated with CHB such as LMNA or SCN5A.  Alternatively, EF could have fallen because of chronic RV pacing.  Finally, we have not ruled out coronary disease as a contributor.  On exam today, he is not volume overloaded.  NYHA class II symptoms.  - Continue current Coreg, digoxin, Lasix, Bidil, and lisinopril.  - Increase spironolactone to 25 mg daily with BMET in 2 wks.  - Check BMET, BNP, digoxin level today.  - 1 month after atrial flutter ablation (after 9/3), I will hold Xarelto and plan LHC/RHC.  We need to rule out coronary disease as a contributor to the cardiomyopathy.  If there is no coronary disease in need of revascularization, next step will be upgrade to CRT-D device.  - Will need to consider genetic evaluation to look for LMNA or SCN5A mutations that could be used for familial screening.  2. Elevated LFTs: Noted in hospital.  Suspected hepatic congestion.  Need to repeat to make sure they are coming down.  3. Atrial flutter: s/p ablation.  He is on Xarelto and in NSR. Will need to continue Xarelto for a month post-ablation, then can stop.  4. PE/DVT: >1 year ago and associated with surgery.   Patricio Popwell 09/21/2014   

## 2014-10-17 NOTE — Discharge Instructions (Signed)
Radial Site Care °Refer to this sheet in the next few weeks. These instructions provide you with information on caring for yourself after your procedure. Your caregiver may also give you more specific instructions. Your treatment has been planned according to current medical practices, but problems sometimes occur. Call your caregiver if you have any problems or questions after your procedure. °HOME CARE INSTRUCTIONS °· You may shower the day after the procedure. Remove the bandage (dressing) and gently wash the site with plain soap and water. Gently pat the site dry. °· Do not apply powder or lotion to the site. °· Do not submerge the affected site in water for 3 to 5 days. °· Inspect the site at least twice daily. °· Do not flex or bend the affected arm for 24 hours. °· No lifting over 5 pounds (2.3 kg) for 5 days after your procedure. °· Do not drive home if you are discharged the same day of the procedure. Have someone else drive you. °· You may drive 24 hours after the procedure unless otherwise instructed by your caregiver. °· Do not operate machinery or power tools for 24 hours. °· A responsible adult should be with you for the first 24 hours after you arrive home. °What to expect: °· Any bruising will usually fade within 1 to 2 weeks. °· Blood that collects in the tissue (hematoma) may be painful to the touch. It should usually decrease in size and tenderness within 1 to 2 weeks. °SEEK IMMEDIATE MEDICAL CARE IF: °· You have unusual pain at the radial site. °· You have redness, warmth, swelling, or pain at the radial site. °· You have drainage (other than a small amount of blood on the dressing). °· You have chills. °· You have a fever or persistent symptoms for more than 72 hours. °· You have a fever and your symptoms suddenly get worse. °· Your arm becomes pale, cool, tingly, or numb. °· You have heavy bleeding from the site. Hold pressure on the site. °Document Released: 02/23/2010 Document Revised:  04/15/2011 Document Reviewed: 02/23/2010 °ExitCare® Patient Information ©2015 [employer]. This information is not intended to replace advice given to you by your health care provider. Make sure you discuss any questions you have with your health care provider. ° °

## 2014-10-18 MED FILL — Nitroglycerin IV Soln 100 MCG/ML in D5W: INTRA_ARTERIAL | Qty: 10 | Status: AC

## 2014-10-19 ENCOUNTER — Other Ambulatory Visit: Payer: Self-pay | Admitting: Family Medicine

## 2014-10-19 NOTE — Telephone Encounter (Signed)
Needs refill on furosemide  Please call pt to let him know if he is to continue the medication

## 2014-10-19 NOTE — Telephone Encounter (Signed)
Please advise refill as Dr. Lacinda Axon is in a new office.  Thanks

## 2014-10-20 ENCOUNTER — Other Ambulatory Visit (INDEPENDENT_AMBULATORY_CARE_PROVIDER_SITE_OTHER): Payer: BC Managed Care – PPO

## 2014-10-20 ENCOUNTER — Telehealth: Payer: Self-pay | Admitting: Internal Medicine

## 2014-10-20 ENCOUNTER — Telehealth: Payer: Self-pay | Admitting: Family Medicine

## 2014-10-20 DIAGNOSIS — Z01812 Encounter for preprocedural laboratory examination: Secondary | ICD-10-CM

## 2014-10-20 DIAGNOSIS — I5042 Chronic combined systolic (congestive) and diastolic (congestive) heart failure: Secondary | ICD-10-CM

## 2014-10-20 LAB — CBC WITH DIFFERENTIAL/PLATELET
BASOS ABS: 0 10*3/uL (ref 0.0–0.1)
Basophils Relative: 0.3 % (ref 0.0–3.0)
EOS PCT: 0.6 % (ref 0.0–5.0)
Eosinophils Absolute: 0 10*3/uL (ref 0.0–0.7)
HEMATOCRIT: 42.5 % (ref 39.0–52.0)
Hemoglobin: 14.2 g/dL (ref 13.0–17.0)
LYMPHS PCT: 29.2 % (ref 12.0–46.0)
Lymphs Abs: 1.8 10*3/uL (ref 0.7–4.0)
MCHC: 33.5 g/dL (ref 30.0–36.0)
MCV: 93.9 fl (ref 78.0–100.0)
MONOS PCT: 11.3 % (ref 3.0–12.0)
Monocytes Absolute: 0.7 10*3/uL (ref 0.1–1.0)
NEUTROS ABS: 3.6 10*3/uL (ref 1.4–7.7)
Neutrophils Relative %: 58.6 % (ref 43.0–77.0)
PLATELETS: 163 10*3/uL (ref 150.0–400.0)
RBC: 4.52 Mil/uL (ref 4.22–5.81)
RDW: 15.1 % (ref 11.5–15.5)
WBC: 6.2 10*3/uL (ref 4.0–10.5)

## 2014-10-20 LAB — BASIC METABOLIC PANEL
BUN: 15 mg/dL (ref 6–23)
CALCIUM: 9.3 mg/dL (ref 8.4–10.5)
CHLORIDE: 103 meq/L (ref 96–112)
CO2: 29 meq/L (ref 19–32)
Creatinine, Ser: 1.13 mg/dL (ref 0.40–1.50)
GFR: 84.05 mL/min (ref >60.00)
GLUCOSE: 104 mg/dL — AB (ref 70–99)
POTASSIUM: 3.9 meq/L (ref 3.5–5.1)
SODIUM: 137 meq/L (ref 135–145)

## 2014-10-20 NOTE — Telephone Encounter (Signed)
New message     Can pt use epsom salt with water as a stool softner.  Pt is on salt restriction.

## 2014-10-20 NOTE — Telephone Encounter (Signed)
Will forward to PCP for further advise.  Derl Barrow, RN

## 2014-10-20 NOTE — Telephone Encounter (Signed)
Spoke with patient and with it only being 2 tsp of salt to one cup of water it should be ok

## 2014-10-20 NOTE — Telephone Encounter (Signed)
Pt would like to know if he can use Epsom salt as a stool softener for his constipation considering the medications that he uses. Please advise at the earliest convenience. Thank you, Fonda Kinder, ASA

## 2014-10-21 MED ORDER — POLYETHYLENE GLYCOL 3350 17 G PO PACK: 17.0000 g | PACK | Freq: Every day | ORAL | Status: DC

## 2014-10-21 NOTE — Telephone Encounter (Signed)
Pt informed and agreeable. Fleeger, Jessica Dawn  

## 2014-10-21 NOTE — Telephone Encounter (Signed)
Prescription for Miralax sent in to help with constipation. Discussed with pharmacy and he may use Epsom salt if he wishes as well. Recommend trying Miralax first.

## 2014-10-24 ENCOUNTER — Encounter (HOSPITAL_COMMUNITY): Payer: Self-pay | Admitting: General Practice

## 2014-10-24 ENCOUNTER — Encounter (HOSPITAL_COMMUNITY)
Admission: RE | Disposition: A | Discharge status: Home / Self Care | Payer: Self-pay | Source: Ambulatory Visit | Attending: Internal Medicine

## 2014-10-24 ENCOUNTER — Ambulatory Visit (HOSPITAL_COMMUNITY)
Admission: RE | Admit: 2014-10-24 | Discharge: 2014-10-25 | Disposition: A | Discharge status: Home / Self Care | Payer: BC Managed Care – PPO | Source: Ambulatory Visit | Attending: Internal Medicine | Admitting: Internal Medicine

## 2014-10-24 DIAGNOSIS — I442 Atrioventricular block, complete: Secondary | ICD-10-CM | POA: Diagnosis not present

## 2014-10-24 DIAGNOSIS — Z79899 Other long term (current) drug therapy: Secondary | ICD-10-CM | POA: Diagnosis not present

## 2014-10-24 DIAGNOSIS — E785 Hyperlipidemia, unspecified: Secondary | ICD-10-CM | POA: Insufficient documentation

## 2014-10-24 DIAGNOSIS — Z7982 Long term (current) use of aspirin: Secondary | ICD-10-CM | POA: Insufficient documentation

## 2014-10-24 DIAGNOSIS — I5042 Chronic combined systolic (congestive) and diastolic (congestive) heart failure: Secondary | ICD-10-CM | POA: Insufficient documentation

## 2014-10-24 DIAGNOSIS — N189 Chronic kidney disease, unspecified: Secondary | ICD-10-CM | POA: Insufficient documentation

## 2014-10-24 DIAGNOSIS — I420 Dilated cardiomyopathy: Secondary | ICD-10-CM | POA: Diagnosis not present

## 2014-10-24 DIAGNOSIS — I129 Hypertensive chronic kidney disease with stage 1 through stage 4 chronic kidney disease, or unspecified chronic kidney disease: Secondary | ICD-10-CM | POA: Diagnosis not present

## 2014-10-24 DIAGNOSIS — Z01812 Encounter for preprocedural laboratory examination: Secondary | ICD-10-CM

## 2014-10-24 DIAGNOSIS — I5022 Chronic systolic (congestive) heart failure: Secondary | ICD-10-CM | POA: Diagnosis not present

## 2014-10-24 DIAGNOSIS — Z23 Encounter for immunization: Secondary | ICD-10-CM | POA: Diagnosis not present

## 2014-10-24 DIAGNOSIS — Z86718 Personal history of other venous thrombosis and embolism: Secondary | ICD-10-CM | POA: Diagnosis not present

## 2014-10-24 DIAGNOSIS — Z87891 Personal history of nicotine dependence: Secondary | ICD-10-CM | POA: Diagnosis not present

## 2014-10-24 DIAGNOSIS — G4733 Obstructive sleep apnea (adult) (pediatric): Secondary | ICD-10-CM | POA: Insufficient documentation

## 2014-10-24 DIAGNOSIS — Z7901 Long term (current) use of anticoagulants: Secondary | ICD-10-CM | POA: Insufficient documentation

## 2014-10-24 DIAGNOSIS — I429 Cardiomyopathy, unspecified: Secondary | ICD-10-CM | POA: Diagnosis not present

## 2014-10-24 DIAGNOSIS — I447 Left bundle-branch block, unspecified: Secondary | ICD-10-CM | POA: Insufficient documentation

## 2014-10-24 HISTORY — DX: Presence of automatic (implantable) cardiac defibrillator: Z95.810

## 2014-10-24 HISTORY — PX: EP IMPLANTABLE DEVICE: SHX172B

## 2014-10-24 LAB — SURGICAL PCR SCREEN
MRSA, PCR: NEGATIVE
STAPHYLOCOCCUS AUREUS: NEGATIVE

## 2014-10-24 SURGERY — BIV UPGRADE

## 2014-10-24 MED ORDER — SODIUM CHLORIDE 0.9 % IR SOLN
Status: AC
  Filled 2014-10-24: qty 2

## 2014-10-24 MED ORDER — SODIUM CHLORIDE 0.9 % IV SOLN
INTRAVENOUS | Status: DC
Start: 2014-10-24 — End: 2014-10-24
  Administered 2014-10-24: 11:00:00 via INTRAVENOUS

## 2014-10-24 MED ORDER — HEPARIN (PORCINE) IN NACL 2-0.9 UNIT/ML-% IJ SOLN
INTRAMUSCULAR | Status: DC | PRN
  Administered 2014-10-24: 14:00:00

## 2014-10-24 MED ORDER — MIDAZOLAM HCL 5 MG/5ML IJ SOLN
INTRAMUSCULAR | Status: AC
  Filled 2014-10-24: qty 25

## 2014-10-24 MED ORDER — ASPIRIN 325 MG PO TABS
325.0000 mg | ORAL_TABLET | Freq: Every day | ORAL | Status: DC
  Administered 2014-10-24: 325 mg via ORAL
  Filled 2014-10-24 (×2): qty 1

## 2014-10-24 MED ORDER — CARVEDILOL 3.125 MG PO TABS
3.1250 mg | ORAL_TABLET | Freq: Two times a day (BID) | ORAL | Status: DC
  Administered 2014-10-24 – 2014-10-25 (×2): 3.125 mg via ORAL
  Filled 2014-10-24 (×2): qty 1

## 2014-10-24 MED ORDER — SODIUM CHLORIDE 0.9 % IR SOLN: 80.0000 mg | Status: DC

## 2014-10-24 MED ORDER — FENTANYL CITRATE (PF) 100 MCG/2ML IJ SOLN
INTRAMUSCULAR | Status: AC
  Filled 2014-10-24: qty 4

## 2014-10-24 MED ORDER — LIDOCAINE HCL (PF) 1 % IJ SOLN
INTRAMUSCULAR | Status: AC
  Filled 2014-10-24: qty 30

## 2014-10-24 MED ORDER — SODIUM CHLORIDE 0.9 % IR SOLN
Status: DC | PRN
  Administered 2014-10-24: 14:00:00

## 2014-10-24 MED ORDER — CEFAZOLIN SODIUM 1-5 GM-% IV SOLN
1.0000 g | Freq: Four times a day (QID) | INTRAVENOUS | Status: AC
  Administered 2014-10-24 – 2014-10-25 (×3): 1 g via INTRAVENOUS
  Filled 2014-10-24 (×3): qty 50

## 2014-10-24 MED ORDER — LISINOPRIL 5 MG PO TABS
2.5000 mg | ORAL_TABLET | Freq: Every day | ORAL | Status: DC
  Administered 2014-10-24 – 2014-10-25 (×2): 2.5 mg via ORAL
  Filled 2014-10-24 (×2): qty 1

## 2014-10-24 MED ORDER — FUROSEMIDE 20 MG PO TABS: 20.0000 mg | ORAL_TABLET | Freq: Every day | ORAL | Status: DC

## 2014-10-24 MED ORDER — CEFAZOLIN SODIUM-DEXTROSE 2-3 GM-% IV SOLR
INTRAVENOUS | Status: AC
  Filled 2014-10-24: qty 50

## 2014-10-24 MED ORDER — FUROSEMIDE 20 MG PO TABS
20.0000 mg | ORAL_TABLET | Freq: Every day | ORAL | Status: DC
  Administered 2014-10-25: 11:00:00 20 mg via ORAL
  Filled 2014-10-24: qty 1

## 2014-10-24 MED ORDER — ACETAMINOPHEN 325 MG PO TABS: 325.0000 mg | ORAL_TABLET | ORAL | Status: DC | PRN

## 2014-10-24 MED ORDER — ISOSORBIDE DINITRATE 10 MG PO TABS
10.0000 mg | ORAL_TABLET | Freq: Three times a day (TID) | ORAL | Status: DC
  Administered 2014-10-24 – 2014-10-25 (×2): 10 mg via ORAL
  Filled 2014-10-24 (×2): qty 1

## 2014-10-24 MED ORDER — POLYETHYLENE GLYCOL 3350 17 G PO PACK
17.0000 g | PACK | Freq: Every day | ORAL | Status: DC
  Administered 2014-10-25: 17 g via ORAL
  Filled 2014-10-24: qty 1

## 2014-10-24 MED ORDER — MUPIROCIN 2 % EX OINT
TOPICAL_OINTMENT | CUTANEOUS | Status: AC
  Filled 2014-10-24: qty 22

## 2014-10-24 MED ORDER — SPIRONOLACTONE 25 MG PO TABS
25.0000 mg | ORAL_TABLET | Freq: Every day | ORAL | Status: DC
  Administered 2014-10-25: 25 mg via ORAL
  Filled 2014-10-24 (×2): qty 1

## 2014-10-24 MED ORDER — FENTANYL CITRATE (PF) 100 MCG/2ML IJ SOLN
INTRAMUSCULAR | Status: AC
  Filled 2014-10-24: qty 2

## 2014-10-24 MED ORDER — INFLUENZA VAC SPLIT QUAD 0.5 ML IM SUSY
0.5000 mL | PREFILLED_SYRINGE | INTRAMUSCULAR | Status: AC
  Administered 2014-10-25: 12:00:00 0.5 mL via INTRAMUSCULAR
  Filled 2014-10-24: qty 0.5

## 2014-10-24 MED ORDER — FENTANYL CITRATE (PF) 100 MCG/2ML IJ SOLN
25.0000 ug | INTRAMUSCULAR | Status: DC | PRN
  Administered 2014-10-24: 50 ug via INTRAVENOUS

## 2014-10-24 MED ORDER — CEFAZOLIN SODIUM-DEXTROSE 2-3 GM-% IV SOLR
2.0000 g | INTRAVENOUS | Status: AC
  Administered 2014-10-24: 2 g via INTRAVENOUS

## 2014-10-24 MED ORDER — MIDAZOLAM HCL 5 MG/5ML IJ SOLN
INTRAMUSCULAR | Status: DC | PRN
  Administered 2014-10-24: 1 mg via INTRAVENOUS
  Administered 2014-10-24 (×2): 2 mg via INTRAVENOUS
  Administered 2014-10-24 (×2): 1 mg via INTRAVENOUS

## 2014-10-24 MED ORDER — MUPIROCIN 2 % EX OINT
1.0000 "application " | TOPICAL_OINTMENT | Freq: Once | CUTANEOUS | Status: DC
  Filled 2014-10-24: qty 22

## 2014-10-24 MED ORDER — YOU HAVE A PACEMAKER BOOK
Freq: Once | Status: AC
Start: 2014-10-24 — End: 2014-10-25
  Administered 2014-10-25
  Filled 2014-10-24: qty 1

## 2014-10-24 MED ORDER — ONDANSETRON HCL 4 MG/2ML IJ SOLN: 4.0000 mg | Freq: Four times a day (QID) | INTRAMUSCULAR | Status: DC | PRN

## 2014-10-24 MED ORDER — RIVAROXABAN 20 MG PO TABS
20.0000 mg | ORAL_TABLET | Freq: Every day | ORAL | Status: DC
  Administered 2014-10-24: 20 mg via ORAL
  Filled 2014-10-24: qty 1

## 2014-10-24 MED ORDER — LIDOCAINE HCL (PF) 1 % IJ SOLN
INTRAMUSCULAR | Status: DC | PRN
  Administered 2014-10-24: 50 mL

## 2014-10-24 MED ORDER — FUROSEMIDE 40 MG PO TABS: 40.0000 mg | ORAL_TABLET | Freq: Two times a day (BID) | ORAL | Status: DC

## 2014-10-24 MED ORDER — FENTANYL CITRATE (PF) 100 MCG/2ML IJ SOLN
INTRAMUSCULAR | Status: DC | PRN
  Administered 2014-10-24: 25 ug via INTRAVENOUS
  Administered 2014-10-24 (×4): 12.5 ug via INTRAVENOUS

## 2014-10-24 MED ORDER — HYDRALAZINE HCL 10 MG PO TABS
20.0000 mg | ORAL_TABLET | Freq: Three times a day (TID) | ORAL | Status: DC
  Administered 2014-10-24 – 2014-10-25 (×2): 20 mg via ORAL
  Filled 2014-10-24 (×5): qty 2

## 2014-10-24 MED ORDER — HYDROCODONE-ACETAMINOPHEN 5-325 MG PO TABS
1.0000 | ORAL_TABLET | ORAL | Status: DC | PRN
  Administered 2014-10-24: 2 via ORAL
  Filled 2014-10-24: qty 2

## 2014-10-24 MED ORDER — IOHEXOL 350 MG/ML SOLN
INTRAVENOUS | Status: DC | PRN
  Administered 2014-10-24: 10 mL via INTRAVENOUS

## 2014-10-24 MED ORDER — DIGOXIN 125 MCG PO TABS
125.0000 ug | ORAL_TABLET | Freq: Every day | ORAL | Status: DC
  Administered 2014-10-24 – 2014-10-25 (×2): 125 ug via ORAL
  Filled 2014-10-24 (×2): qty 1

## 2014-10-24 MED ORDER — SIMVASTATIN 20 MG PO TABS
20.0000 mg | ORAL_TABLET | Freq: Every day | ORAL | Status: DC
  Administered 2014-10-24: 19:00:00 20 mg via ORAL
  Filled 2014-10-24: qty 1

## 2014-10-24 SURGICAL SUPPLY — 18 items
ADAPTER SEALING SSA-EW-09 (MISCELLANEOUS) ×2 IMPLANT
ADPR INTRO LNG 9FR SL XTD WNG (MISCELLANEOUS) ×1
CABLE SURGICAL S-101-97-12 (CABLE) ×2 IMPLANT
CATH ATTAIN LDS 6216A-MB2 (CATHETERS) ×2 IMPLANT
CATH HEX JOS 2-5-2 65CM 6F REP (CATHETERS) ×2 IMPLANT
ICD VIVA QUAD XT CRT-D DTBA1Q1 (ICD Generator) ×2 IMPLANT
LEAD ATTAIN PERFORMA S 4598-88 (Lead) ×2 IMPLANT
LEAD SPRINT QUAT SEC 6935-65CM (Lead) ×2 IMPLANT
PAD DEFIB LIFELINK (PAD) ×2 IMPLANT
SHEATH CLASSIC 7F (SHEATH) IMPLANT
SHEATH CLASSIC 8F (SHEATH) IMPLANT
SHEATH CLASSIC 9.5F (SHEATH) ×2 IMPLANT
SHEATH CLASSIC 9F (SHEATH) ×2 IMPLANT
SHIELD RADPAD SCOOP 12X17 (MISCELLANEOUS) ×2 IMPLANT
SLITTER 6232ADJ (MISCELLANEOUS) ×2 IMPLANT
TRAY PACEMAKER INSERTION (CUSTOM PROCEDURE TRAY) ×2 IMPLANT
WIRE ACUITY WHISPER EDS 4648 (WIRE) ×2 IMPLANT
WIRE MAILMAN 182CM (WIRE) ×2 IMPLANT

## 2014-10-24 NOTE — Interval H&P Note (Signed)
History and Physical Interval Note:  10/24/2014 12:08 PM  Philip Chavez  has presented today for surgery, with the diagnosis of chf  The various methods of treatment have been discussed with the patient and family. After consideration of risks, benefits and other options for treatment, the patient has consented to  Procedure(s): BiV ICD Upgrade (N/A) with removal of PM RV lead a surgical intervention .  The patient's history has been reviewed, patient examined, no change in status, stable for surgery.  I have reviewed the patient's chart and labs.  Questions were answered to the patient's satisfaction.     [name]

## 2014-10-24 NOTE — H&P (View-Only) (Signed)
HPI [name] returns today for followup. He is a 64 yo man with a h/o complete heart block and HTN, s/p PPM insertion. He has developed LV dysfunction. He had atrial flutter and underwent ablation. He has not had syncope. His EF has been less than 30% for over a year. He has been on maximal medical therapy. His heart failure is class 2. 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 No Known Allergies   Current Outpatient Prescriptions  Medication Sig Dispense Refill  . aspirin 325 MG tablet Take 325 mg by mouth daily.    . calcium carbonate (TUMS - DOSED IN MG ELEMENTAL CALCIUM) 500 MG chewable tablet Chew 1 tablet by mouth as needed for indigestion or heartburn.    . carvedilol (COREG) 3.125 MG tablet Take 1 tablet (3.125 mg total) by mouth 2 (two) times daily with a meal. 60 tablet 12  . digoxin (LANOXIN) 0.125 MG tablet Take 1 tablet (125 mcg total) by mouth daily. 30 tablet 12  . furosemide (LASIX) 20 MG tablet Take two tablets in the morning and 1 tablet at night 90 tablet 0  . hydrALAZINE (APRESOLINE) 10 MG tablet TAKE TWO TABLETS BY MOUTH THREE TIMES DAILY. 90 tablet 12  . isosorbide dinitrate (ISORDIL) 10 MG tablet Take 1 tablet (10 mg total) by mouth 3 (three) times daily. 90 tablet 12  . lisinopril (PRINIVIL,ZESTRIL) 5 MG tablet Take 0.5 tablets (2.5 mg total) by mouth daily. 90 tablet 3  . rivaroxaban (XARELTO) 20 MG TABS tablet Take 1 tablet (20 mg total) by mouth daily with supper. 30 tablet 6  . simvastatin (ZOCOR) 20 MG tablet Take 20 mg by mouth daily.    Marland Kitchen spironolactone (ALDACTONE) 25 MG tablet Take 1 tablet (25 mg total) by mouth daily. (Patient taking differently: Take 12.5 mg by mouth daily. ) 30 tablet 3   No current facility-administered medications for this visit.     Past Medical History  Diagnosis Date  . DVT (deep venous thrombosis)     a. 2007 RLE: S/P ankle surgery;  b. 03/2013 LLE DVT & PE-->Xarelto.  . Embolism, pulmonary with  infarction     a. 2007 RLE: S/P ankle surgery;  b. 03/2013 LLE DVT & PE-->Xarelto.  . Third degree heart block     a. 03/2013 s/p MDT ADDRL1 Adapta DC PPM, ser # [id number] H.  . Hypercoagulable state   . Musculoskeletal neck pain   . Hypertension   . Presence of permanent cardiac pacemaker     a. 03/2013 s/p MDT ADDRL1 Adapta DC PPM, ser # [id number] H.  . Hypercholesterolemia   . Chronic combined systolic and diastolic CHF (congestive heart failure)     a. 03/2013 Echo EF 50-55%;  b. 07/2013 Echo EF 20-25%, diff HK, Gr3 DD;  c. 08/2014 Echo: EF 15%, diff HK, mild AI/MR, mildly dil LA/RV, mod TR, PASP 97mmHg.  . Sleep apnea     does not wear mask (09/02/2014)  . Pneumonia ~ 04/2014; 09/02/2014  . Nonischemic cardiomyopathy     a. 07/2013 Echo: EF 20-25%;  b. 07/2013 Myoview: Large inferior, lateral, apical scar w/ HK, no ischemia, EF 17%;  b. 08/2014 Echo: EF 15%, diff HK.  Marland Kitchen Atrial tachycardia   . Atrial flutter     a. 08/2014 presented w/ aflutter->converted on amio;  b. CHA2DS2VASc = 2 -->Xarelto.    ROS:   All systems reviewed and negative except as noted in the HPI.  Past Surgical History  Procedure Laterality Date  . Foot fracture surgery Right 2007  . Permanent pacemaker insertion N/A 03/15/2013    Procedure: PERMANENT PACEMAKER INSERTION;  Surgeon: Evans Lance, MD;  Location: [facility] CATH LAB;  Service: Cardiovascular;  Laterality: N/A;  . Inguinal hernia repair Right 1980's  . Insert / replace / remove pacemaker      MDT ADDRL1 pacemaker implanted by Dr Lovena Le for complete heart block  . Fracture surgery    . Electrophysiologic study N/A 09/07/2014    Procedure: A-Flutter;  Surgeon: Evans Lance, MD;  Location: [facility] CV LAB;  Service: Cardiovascular;  Laterality: N/A;     Family History  Problem Relation Age of Onset  . Diabetes type II Mother   . Heart disease Father      Social History   Social History  . Marital Status: Married    Spouse Name: N/A  . Number of  Children: N/A  . Years of Education: N/A   Occupational History  . Not on file.   Social History Main Topics  . Smoking status: Former Smoker -- 1.50 packs/day for 10 years    Types: Cigarettes  . Smokeless tobacco: Never Used     Comment: 09/02/2014  "quit smoking 20-30 yr ago"  . Alcohol Use: No  . Drug Use: No  . Sexual Activity: Yes   Other Topics Concern  . Not on file   Social History Narrative     BP 100/64 mmHg  Pulse 84  Ht 6\' 1"  (1.854 m)  Wt 197 lb (89.359 kg)  BMI 26.00 kg/m2  Physical Exam:  Well appearing middle aged man,NAD HEENT: Unremarkable Neck:  7 cm JVD, no thyromegally Back:  No CVA tenderness Lungs:  Clear with no wheezes, well healed PPM incision. HEART:  Regular rate rhythm, no murmurs, no rubs, no clicks, soft S3., LV heave Abd:  soft, positive bowel sounds, no organomegally, no rebound, no guarding Ext:  2 plus pulses, no edema, no cyanosis, no clubbing Skin:  No rashes no nodules Neuro:  CN II through XII intact, motor grossly intact   DEVICE  Normal device function.  See PaceArt for details.   Assess/Plan: All all

## 2014-10-25 ENCOUNTER — Ambulatory Visit (HOSPITAL_COMMUNITY): Payer: BC Managed Care – PPO

## 2014-10-25 ENCOUNTER — Encounter (HOSPITAL_COMMUNITY): Payer: Self-pay | Admitting: Internal Medicine

## 2014-10-25 DIAGNOSIS — I420 Dilated cardiomyopathy: Secondary | ICD-10-CM

## 2014-10-25 DIAGNOSIS — I447 Left bundle-branch block, unspecified: Secondary | ICD-10-CM | POA: Diagnosis not present

## 2014-10-25 DIAGNOSIS — Z23 Encounter for immunization: Secondary | ICD-10-CM | POA: Diagnosis not present

## 2014-10-25 DIAGNOSIS — I442 Atrioventricular block, complete: Secondary | ICD-10-CM | POA: Diagnosis not present

## 2014-10-25 MED ORDER — FUROSEMIDE 20 MG PO TABS: 20.0000 mg | ORAL_TABLET | Freq: Two times a day (BID) | ORAL | Status: DC

## 2014-10-25 MED ORDER — ACETAMINOPHEN 325 MG PO TABS: 325.0000 mg | ORAL_TABLET | ORAL | Status: DC | PRN

## 2014-10-25 MED ORDER — HYDROCODONE-ACETAMINOPHEN 5-325 MG PO TABS: 1.0000 | ORAL_TABLET | ORAL | Status: DC | PRN

## 2014-10-25 NOTE — Progress Notes (Signed)
Patient ID: Heraclio Seidman, male   DOB: 03/04/1950, 64 y.o.   MRN: [id number] ICD Criteria  Current LVEF:20%. Within 12 months prior to implant: Yes   Heart failure history: Yes, Class III  Cardiomyopathy history: Yes, Non-Ischemic Cardiomyopathy.  Atrial Fibrillation/Atrial Flutter: Yes, Paroxysmal.  Ventricular tachycardia history: No.  Cardiac arrest history: No.  History of syndromes with risk of sudden death: No.  Previous ICD: No.  Current ICD indication: Primary  PPM indication: Yes. Pacing type: Both. Greater than 40% RV pacing requirement anticipated. Indication: Complete Heart Block   Class I or II Bradycardia indication present: Yes  Beta Blocker therapy for 3 or more months: Yes, prescribed.   Ace Inhibitor/ARB therapy for 3 or more months: Yes, prescribed.

## 2014-10-25 NOTE — Discharge Summary (Signed)
ELECTROPHYSIOLOGY PROCEDURE DISCHARGE SUMMARY    Patient ID: Philip Chavez,  MRN: [id number], DOB/AGE: Dec 13, 1950 64 y.o.  Admit date: 10/24/2014 Discharge date: 10/25/2014  Primary Care Physician: Junie Panning, DO Primary Cardiologist: Aundra Dubin Electrophysiologist: Lovena Le  Primary Discharge Diagnosis:  Dilated cardiomyopathy and congestive heart failures status post upgrade to CRTD this admission  Secondary Discharge Diagnosis:  1.  Complete heart block s/p PPM implant 2.  Atrial flutter s/p ablation 3.  CKD 4.  Left leg DVT and PE in 03/2013 5.  OSA 6.  Hyperlipidemia  No Known Allergies   Procedures This Admission:  1.  Upgrade of a previously implanted dual chamber PPM to a MDT CRTD with removal of previously implanted RV lead on 10/24/14 by Dr Lovena Le.  See op note for full details. DFT's were successful at 65 J.  There were no immediate post procedure complications. 2.  CXR on 10/25/14 demonstrated no pneumothorax status post device implantation.   Brief HPI: Philip Chavez is a 64 y.o. male with complete heart block and prior PPM implantation. He has had worsening of his EF felt to be related to RV pacing and heart failure symptoms.  The patient has persistent LV dysfunction despite guideline directed therapy.  Risks, benefits, and alternatives to CRTD upgrade were reviewed with the patient who wished to proceed.   Hospital Course:  The patient was admitted and underwent upgrade of his previously implanted PPM to CRTD with removal of his previously implanted RV lead with details as outlined above. He was monitored on telemetry overnight which demonstrated sinus rhythm with V pacing.  Left chest was without hematoma or ecchymosis.  The device was interrogated and found to be functioning normally.  CXR was obtained and demonstrated no pneumothorax status post device implantation.  Wound care, arm mobility, and restrictions were reviewed with the patient.  The patient was  examined and considered stable for discharge to home.   The patient's discharge medications include an ACE-I (Lisinopril) and beta blocker (Coreg).   Physical Exam: Filed Vitals:   10/24/14 1900 10/24/14 1940 10/24/14 2100 10/25/14 0337  BP: 128/76 95/78 95/73  108/75  Pulse: 97 82 73 72  Temp:  97.9 F (36.6 C)  98.4 F (36.9 C)  TempSrc:  Oral  Oral  Resp: 28 25 21 22   Height:      Weight:      SpO2: 98% 98% 97% 98%    GEN- The patient is well appearing, alert and oriented x 3 today.   HEENT: normocephalic, atraumatic; sclera clear, conjunctiva pink; hearing intact; oropharynx clear; neck supple  Lungs- Clear to ausculation bilaterally, normal work of breathing.  No wheezes, rales, rhonchi Heart- Regular rate and rhythm (paced) GI- soft, non-tender, non-distended, bowel sounds present  Extremities- no clubbing, cyanosis, or edema  MS- no significant deformity or atrophy Skin- warm and dry, no rash or lesion, left chest without hematoma/ecchymosis Psych- euthymic mood, full affect Neuro- strength and sensation are intact   Labs:   Lab Results  Component Value Date   WBC 6.2 10/20/2014   HGB 14.2 10/20/2014   HCT 42.5 10/20/2014   MCV 93.9 10/20/2014   PLT 163.0 10/20/2014     Recent Labs Lab 10/20/14 1302  NA 137  K 3.9  CL 103  CO2 29  BUN 15  CREATININE 1.13  CALCIUM 9.3  GLUCOSE 104*    Discharge Medications:    Medication List    STOP taking these medications  aspirin 325 MG tablet      TAKE these medications        acetaminophen 325 MG tablet  Commonly known as:  TYLENOL  Take 1-2 tablets (325-650 mg total) by mouth every 4 (four) hours as needed for mild pain.     carvedilol 3.125 MG tablet  Commonly known as:  COREG  Take 1 tablet (3.125 mg total) by mouth 2 (two) times daily with a meal.     digoxin 0.125 MG tablet  Commonly known as:  LANOXIN  Take 1 tablet (125 mcg total) by mouth daily.     furosemide 20 MG tablet    Commonly known as:  LASIX  Take 1 tablet (20 mg total) by mouth 2 (two) times daily.     hydrALAZINE 10 MG tablet  Commonly known as:  APRESOLINE  TAKE TWO TABLETS BY MOUTH THREE TIMES DAILY.     HYDROcodone-acetaminophen 5-325 MG per tablet  Commonly known as:  NORCO/VICODIN  Take 1-2 tablets by mouth every 4 (four) hours as needed for moderate pain.     isosorbide dinitrate 10 MG tablet  Commonly known as:  ISORDIL  Take 1 tablet (10 mg total) by mouth 3 (three) times daily.     lisinopril 5 MG tablet  Commonly known as:  PRINIVIL,ZESTRIL  Take 0.5 tablets (2.5 mg total) by mouth daily.     polyethylene glycol packet  Commonly known as:  MIRALAX / GLYCOLAX  Take 17 g by mouth daily.     rivaroxaban 20 MG Tabs tablet  Commonly known as:  XARELTO  Take 1 tablet (20 mg total) by mouth daily with supper.     simvastatin 20 MG tablet  Commonly known as:  ZOCOR  Take 20 mg by mouth daily.     spironolactone 25 MG tablet  Commonly known as:  ALDACTONE  Take 1 tablet (25 mg total) by mouth daily.        Disposition:  Discharge Instructions    Diet - low sodium heart healthy    Complete by:  As directed      Increase activity slowly    Complete by:  As directed           Follow-up Information    Follow up with [facility] On 11/03/2014.   Why:  at 3 PM   Contact information:   [address] [place] [address]       Follow up with Patsey Berthold, NP On 12/07/2014.   Specialty:  Nurse Practitioner   Why:  at 2:20PM   Contact information:   [place] [address] [phone]       Follow up with [name], MD On 01/24/2015.   Specialty:  Cardiology   Why:  at 9:15AM   Contact information:   [address] [place] [address] [phone]       Duration of Discharge Encounter: Greater than 30 minutes including physician time.  Signed, Chanetta Marshall, NP 10/25/2014 7:44  AM   EP Attending  Patient seen and examined. Agree with above. CXR demonstrates no PTX and leads in good position. [facility] for discharge. He will need pain meds as we placed his new device subpectorally. Usual followup.  [name].D.

## 2014-10-25 NOTE — Discharge Instructions (Signed)
° ° °  Supplemental Discharge Instructions for  Pacemaker/Defibrillator Patients  Activity No heavy lifting or vigorous activity with your left/right arm for 6 to 8 weeks.  Do not raise your left/right arm above your head for one week.  Gradually raise your affected arm as drawn below.           __        10/29/14                    10/30/14                    10/31/14                    11/01/14  NO DRIVING for   1 week     WOUND CARE - Keep the wound area clean and dry.  Do not get this area wet for one week. No showers for one week; you may shower on  11/01/14   . - The tape/steri-strips on your wound will fall off; do not pull them off.  No bandage is needed on the site.  DO  NOT apply any creams, oils, or ointments to the wound area. - If you notice any drainage or discharge from the wound, any swelling or bruising at the site, or you develop a fever > 101? F after you are discharged home, call the office at once.  Special Instructions - You are still able to use cellular telephones; use the ear opposite the side where you have your pacemaker/defibrillator.  Avoid carrying your cellular phone near your device. - When traveling through airports, show security personnel your identification card to avoid being screened in the metal detectors.  Ask the security personnel to use the hand wand. - Avoid arc welding equipment, MRI testing (magnetic resonance imaging), TENS units (transcutaneous nerve stimulators).  Call the office for questions about other devices. - Avoid electrical appliances that are in poor condition or are not properly grounded. - Microwave ovens are safe to be near or to operate.  Additional information for defibrillator patients should your device go off: - If your device goes off ONCE and you feel fine afterward, notify the device clinic nurses. - If your device goes off ONCE and you do not feel well afterward, call 911. - If your device goes off TWICE, call 911. - If your  device goes off THREE times in one day, call 911.  DO NOT DRIVE YOURSELF OR A FAMILY MEMBER WITH A DEFIBRILLATOR TO THE HOSPITAL--CALL 911.

## 2014-10-26 ENCOUNTER — Encounter (HOSPITAL_COMMUNITY): Payer: BC Managed Care – PPO

## 2014-10-28 ENCOUNTER — Encounter (HOSPITAL_COMMUNITY): Payer: Self-pay

## 2014-10-28 ENCOUNTER — Ambulatory Visit (HOSPITAL_COMMUNITY)
Admission: RE | Admit: 2014-10-28 | Discharge: 2014-10-28 | Disposition: A | Discharge status: Home / Self Care | Payer: BC Managed Care – PPO | Source: Ambulatory Visit | Attending: Cardiology | Admitting: Cardiology

## 2014-10-28 VITALS — BP 102/58 | HR 95 | Wt 197.0 lb

## 2014-10-28 DIAGNOSIS — I4892 Unspecified atrial flutter: Secondary | ICD-10-CM | POA: Diagnosis not present

## 2014-10-28 DIAGNOSIS — I428 Other cardiomyopathies: Secondary | ICD-10-CM | POA: Insufficient documentation

## 2014-10-28 DIAGNOSIS — I2510 Atherosclerotic heart disease of native coronary artery without angina pectoris: Secondary | ICD-10-CM | POA: Diagnosis not present

## 2014-10-28 DIAGNOSIS — Z86711 Personal history of pulmonary embolism: Secondary | ICD-10-CM | POA: Insufficient documentation

## 2014-10-28 DIAGNOSIS — Z950 Presence of cardiac pacemaker: Secondary | ICD-10-CM | POA: Insufficient documentation

## 2014-10-28 DIAGNOSIS — G4733 Obstructive sleep apnea (adult) (pediatric): Secondary | ICD-10-CM | POA: Diagnosis not present

## 2014-10-28 DIAGNOSIS — I442 Atrioventricular block, complete: Secondary | ICD-10-CM

## 2014-10-28 DIAGNOSIS — Z8249 Family history of ischemic heart disease and other diseases of the circulatory system: Secondary | ICD-10-CM | POA: Diagnosis not present

## 2014-10-28 DIAGNOSIS — Z7902 Long term (current) use of antithrombotics/antiplatelets: Secondary | ICD-10-CM | POA: Insufficient documentation

## 2014-10-28 DIAGNOSIS — R52 Pain, unspecified: Secondary | ICD-10-CM | POA: Diagnosis not present

## 2014-10-28 DIAGNOSIS — N189 Chronic kidney disease, unspecified: Secondary | ICD-10-CM | POA: Insufficient documentation

## 2014-10-28 DIAGNOSIS — I5022 Chronic systolic (congestive) heart failure: Secondary | ICD-10-CM | POA: Insufficient documentation

## 2014-10-28 DIAGNOSIS — E785 Hyperlipidemia, unspecified: Secondary | ICD-10-CM | POA: Diagnosis not present

## 2014-10-28 DIAGNOSIS — I429 Cardiomyopathy, unspecified: Secondary | ICD-10-CM | POA: Diagnosis not present

## 2014-10-28 DIAGNOSIS — Z86718 Personal history of other venous thrombosis and embolism: Secondary | ICD-10-CM | POA: Diagnosis not present

## 2014-10-28 MED ORDER — SIMVASTATIN 40 MG PO TABS: 40.0000 mg | ORAL_TABLET | Freq: Every day | ORAL | Status: DC

## 2014-10-28 MED ORDER — HYDROCODONE-ACETAMINOPHEN 5-325 MG PO TABS: 1.0000 | ORAL_TABLET | ORAL | Status: DC | PRN

## 2014-10-28 MED ORDER — ASPIRIN EC 81 MG PO TBEC: 81.0000 mg | DELAYED_RELEASE_TABLET | Freq: Every day | ORAL | Status: DC

## 2014-10-28 NOTE — Patient Instructions (Addendum)
INCREASE Zocor to 40 mg, one tab daily  STOP Xarelto.  START Aspirin 81mg  daily.  FOLLOW UP: 2 months.  Do the following things EVERYDAY: 1) Weigh yourself in the morning before breakfast. Write it down and keep it in a log. 2) Take your medicines as prescribed 3) Eat low salt foods-Limit salt (sodium) to 2000 mg per day.  4) Stay as active as you can everyday 5) Limit all fluids for the day to less than 2 liters

## 2014-10-29 NOTE — Progress Notes (Signed)
Patient ID: Philip Chavez, male   DOB: 1950-08-10, 64 y.o.   MRN: [id number] EP: Dr. Lovena Le HF cardiologist: Dr Aundra Dubin  64 yo with history of complete heart block/Medtronic PPM, cardiomyopathy, and atrial flutter s/p ablation presents for CHF clinic evaluation.  Patient developed complete heart block in 2/15 and had PPM placed at that time.  He paces his RV continuously.  In 2/15, EF was 50-55% by echo.  By 6/15, EF had fallen to 20-25%.  Cardiolite showed possible scar but no ischemia.  On 09/03/14, he was admitted with dyspnea and found to be in atrial flutter with RLL PNA and with volume overload.  Echo showed EF 15% with diffuse hypokinesis.  He was started on IV Lasix and IV amiodarone. He converted back to NSR.  He subsequently had atrial flutter ablation.  He had right and left heart cath in 9/16, showing nonobstructive CAD and optimized filling pressures with CI 2.09.  He subsequently had upgrade to CRT by Dr Lovena Le.   He is doing well today.  No exertional dyspnea or chest pain.  He does not get lightheaded.  No palpitations.  Interrogation of Medtronic device today shows no atrial fibrillation or flutter.    ECG: NSR with BiV pacing, blocked PAC  Labs (8/16): K 3.9, creatinine 1.03, AST 106, ALT 173, HCT 45.1, LDL 132 Labs (9/16): K 3.9, creatinine 1.16, HCT 42.5  PMH: 1. H/o complete heart block: Has Medtronic PPM, placed in 2/15.   2. Cardiomyopathy: Echo (2/15) prior to PPM placement with EF 50-55%.  Echo (6/15) with EF 20-25%,  Cardiolite at that time showed possible scar but no ischemia.  Echo (8/16) with EF 15%, diffuse hypokinesis, mildly dilated RV with normal systolic function, PA systolic pressure 69 mmHg.  LHC/RHC (9/16) with mean RA 4, PA 31/15 mean 20, mean PCWP 8, CI 2.09, 40% mLAD stenosis.  Upgrade to MDT CRT-D in 9/16.  3. CKD 4. 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  5. Atrial tachycardia: paroxysmal. 6. Atrial flutter: s/p ablation in 8/16.  7. OSA 8.  Hyperlipidemia  FH: Father with CHF, complete heart block with PPM.  Sister with complete heart block and PPM.   SH: Retired Curator, nonsmoker, lives in [place]: All systems reviewed and negative except as per HPI  Current Outpatient Prescriptions  Medication Sig Dispense Refill  . acetaminophen (TYLENOL) 325 MG tablet Take 1-2 tablets (325-650 mg total) by mouth every 4 (four) hours as needed for mild pain.    . carvedilol (COREG) 3.125 MG tablet Take 1 tablet (3.125 mg total) by mouth 2 (two) times daily with a meal. 60 tablet 12  . digoxin (LANOXIN) 0.125 MG tablet Take 1 tablet (125 mcg total) by mouth daily. 30 tablet 12  . furosemide (LASIX) 20 MG tablet Take 1 tablet (20 mg total) by mouth 2 (two) times daily. 90 tablet 0  . hydrALAZINE (APRESOLINE) 10 MG tablet Take 20 mg by mouth 3 (three) times daily.    Marland Kitchen HYDROcodone-acetaminophen (NORCO/VICODIN) 5-325 MG per tablet Take 1-2 tablets by mouth every 4 (four) hours as needed for moderate pain. 10 tablet 0  . isosorbide dinitrate (ISORDIL) 10 MG tablet Take 1 tablet (10 mg total) by mouth 3 (three) times daily. 90 tablet 12  . lisinopril (PRINIVIL,ZESTRIL) 5 MG tablet Take 5 mg by mouth daily.    . polyethylene glycol (MIRALAX / GLYCOLAX) packet Take 17 g by mouth daily. 14 each 3  . simvastatin (ZOCOR) 40 MG  tablet Take 1 tablet (40 mg total) by mouth daily. 30 tablet 6  . spironolactone (ALDACTONE) 25 MG tablet Take 1 tablet (25 mg total) by mouth daily. 30 tablet 3  . aspirin EC 81 MG tablet Take 1 tablet (81 mg total) by mouth daily. 30 tablet 3   No current facility-administered medications for this encounter.   BP 102/58 mmHg  Pulse 95  Wt 197 lb (89.359 kg)  SpO2 98% General: NAD Neck: No JVD, no thyromegaly or thyroid nodule.  Lungs: Clear to auscultation bilaterally with normal respiratory effort. CV: Nondisplaced PMI.  Heart regular S1/S2, no S3/S4, no murmur.  No peripheral edema.  No carotid bruit.  Normal  pedal pulses.  Abdomen: Soft, nontender, no hepatosplenomegaly, no distention.  Skin: Intact without lesions or rashes.  Neurologic: Alert and oriented x 3.  Psych: Normal affect. Extremities: No clubbing or cyanosis.  HEENT: Normal.   Assessment/Plan: 1. Chronic systolic CHF: Nonischemic cardiomyopathy.  EF 15% on last echo in 8/16.  Patient had borderline reduced EF when PPM was placed in 2/15.  Since then, EF has fallen significantly.  Patient has complete heart block.  Interestingly, father had CHF and CHB with pacemaker, and sounds like sister also had CHB with pacemaker. I am concerned for a genetic dilated cardiomyopathy associated with CHB such as LMNA or SCN5A.  Alternatively, EF could have fallen because of chronic RV pacing.  He has had CRT upgrade.  No significant CAD on recent cath.  On exam today, he is not volume overloaded.  NYHA class II symptoms.  - Continue current Coreg, digoxin, Lasix, Bidil, spironolactone, and lisinopril.  - Will need to consider genetic evaluation to look for LMNA or SCN5A mutations that could be used for familial screening.  2. Atrial flutter: s/p ablation.  He is on Xarelto and in NSR. Device interrogation shows no atrial fibrillation or flutter.  He is > 1 month post-ablation.  I think that he can stop Xarelto and start ASA 81 daily now.  If he has atrial fibrillation in the future, will need to go back on Xarelto.  3. PE/DVT: >1 year ago and associated with surgery.  4. Hyperlipidemia: He has mild CAD.  With elevated LDL, will increase simvastatin to 40 mg daily with lipids/LFTs in 2 months.   Followup in 2 months.   Loralie Champagne 10/29/2014

## 2014-11-03 ENCOUNTER — Ambulatory Visit (INDEPENDENT_AMBULATORY_CARE_PROVIDER_SITE_OTHER): Payer: BC Managed Care – PPO | Admitting: *Deleted

## 2014-11-03 ENCOUNTER — Encounter: Payer: Self-pay | Admitting: Internal Medicine

## 2014-11-03 DIAGNOSIS — I442 Atrioventricular block, complete: Secondary | ICD-10-CM

## 2014-11-03 DIAGNOSIS — I5042 Chronic combined systolic (congestive) and diastolic (congestive) heart failure: Secondary | ICD-10-CM

## 2014-11-03 DIAGNOSIS — I429 Cardiomyopathy, unspecified: Secondary | ICD-10-CM | POA: Diagnosis not present

## 2014-11-03 DIAGNOSIS — I428 Other cardiomyopathies: Secondary | ICD-10-CM

## 2014-11-03 LAB — CUP PACEART INCLINIC DEVICE CHECK
Battery Voltage: 3.02 V
Brady Statistic AP VP Percent: 4.34 %
Brady Statistic AP VS Percent: 0.02 %
Brady Statistic AS VP Percent: 93.73 %
Brady Statistic RA Percent Paced: 4.36 %
Brady Statistic RV Percent Paced: 97.08 %
Date Time Interrogation Session: 20160929162222
HighPow Impedance: 209 Ohm
HighPow Impedance: 58 Ohm
Lead Channel Impedance Value: 437 Ohm
Lead Channel Pacing Threshold Amplitude: 0.75 V
Lead Channel Pacing Threshold Amplitude: 1 V
Lead Channel Pacing Threshold Pulse Width: 0.4 ms
Lead Channel Sensing Intrinsic Amplitude: 14.375 mV
Lead Channel Sensing Intrinsic Amplitude: 4.875 mV
Lead Channel Setting Pacing Amplitude: 2 V
Lead Channel Setting Pacing Amplitude: 3 V
Lead Channel Setting Pacing Amplitude: 3.5 V
Lead Channel Setting Pacing Pulse Width: 0.4 ms
MDC IDC MSMT BATTERY REMAINING LONGEVITY: 79 mo
MDC IDC MSMT LEADCHNL LV PACING THRESHOLD AMPLITUDE: 1.625 V
MDC IDC MSMT LEADCHNL LV PACING THRESHOLD PULSEWIDTH: 0.4 ms
MDC IDC MSMT LEADCHNL RA IMPEDANCE VALUE: 513 Ohm
MDC IDC MSMT LEADCHNL RA PACING THRESHOLD PULSEWIDTH: 0.4 ms
MDC IDC MSMT LEADCHNL RA SENSING INTR AMPL: 2.75 mV
MDC IDC SET LEADCHNL LV PACING PULSEWIDTH: 0.4 ms
MDC IDC SET LEADCHNL RV SENSING SENSITIVITY: 0.3 mV
MDC IDC SET ZONE DETECTION INTERVAL: 300 ms
MDC IDC SET ZONE DETECTION INTERVAL: 390 ms
MDC IDC SET ZONE DETECTION INTERVAL: 400 ms
MDC IDC STAT BRADY AS VS PERCENT: 1.92 %
Zone Setting Detection Interval: 370 ms

## 2014-11-03 NOTE — Progress Notes (Signed)
Upgrade wound check appointment. Steri-strips removed. Wound without redness or edema. Incision edges approximated, wound well healed. Normal device function. Thresholds, sensing, and impedances consistent with implant measurements. Device programmed at 3.5V for extra safety margin until 3 month visit. Histogram distribution appropriate for patient and level of activity. 1 AT/AF, 29min. No ventricular arrhythmias noted. Patient educated about wound care, arm mobility, lifting restrictions, shock plan. ROV w/ AS 12/07/14 & w/ GT 01/24/15.

## 2014-11-06 ENCOUNTER — Emergency Department (HOSPITAL_COMMUNITY)
Admission: EM | Admit: 2014-11-06 | Discharge: 2014-11-06 | Disposition: A | Discharge status: Home / Self Care | Payer: BC Managed Care – PPO | Attending: Emergency Medicine | Admitting: Emergency Medicine

## 2014-11-06 ENCOUNTER — Encounter (HOSPITAL_COMMUNITY): Payer: Self-pay

## 2014-11-06 DIAGNOSIS — Z86711 Personal history of pulmonary embolism: Secondary | ICD-10-CM | POA: Diagnosis not present

## 2014-11-06 DIAGNOSIS — Z950 Presence of cardiac pacemaker: Secondary | ICD-10-CM | POA: Insufficient documentation

## 2014-11-06 DIAGNOSIS — Z86718 Personal history of other venous thrombosis and embolism: Secondary | ICD-10-CM | POA: Diagnosis not present

## 2014-11-06 DIAGNOSIS — I5042 Chronic combined systolic (congestive) and diastolic (congestive) heart failure: Secondary | ICD-10-CM | POA: Insufficient documentation

## 2014-11-06 DIAGNOSIS — E7800 Pure hypercholesterolemia, unspecified: Secondary | ICD-10-CM | POA: Diagnosis not present

## 2014-11-06 DIAGNOSIS — I10 Essential (primary) hypertension: Secondary | ICD-10-CM | POA: Insufficient documentation

## 2014-11-06 DIAGNOSIS — B002 Herpesviral gingivostomatitis and pharyngotonsillitis: Secondary | ICD-10-CM | POA: Diagnosis not present

## 2014-11-06 DIAGNOSIS — Z8701 Personal history of pneumonia (recurrent): Secondary | ICD-10-CM | POA: Insufficient documentation

## 2014-11-06 DIAGNOSIS — B029 Zoster without complications: Secondary | ICD-10-CM

## 2014-11-06 DIAGNOSIS — Z79899 Other long term (current) drug therapy: Secondary | ICD-10-CM | POA: Insufficient documentation

## 2014-11-06 DIAGNOSIS — Z87891 Personal history of nicotine dependence: Secondary | ICD-10-CM | POA: Diagnosis not present

## 2014-11-06 DIAGNOSIS — R21 Rash and other nonspecific skin eruption: Secondary | ICD-10-CM | POA: Diagnosis present

## 2014-11-06 DIAGNOSIS — Z7982 Long term (current) use of aspirin: Secondary | ICD-10-CM | POA: Insufficient documentation

## 2014-11-06 LAB — CBC
HEMATOCRIT: 39.9 % (ref 39.0–52.0)
HEMOGLOBIN: 13.3 g/dL (ref 13.0–17.0)
MCH: 31.1 pg (ref 26.0–34.0)
MCHC: 33.3 g/dL (ref 30.0–36.0)
MCV: 93.4 fL (ref 78.0–100.0)
Platelets: 152 10*3/uL (ref 150–400)
RBC: 4.27 MIL/uL (ref 4.22–5.81)
RDW: 13.9 % (ref 11.5–15.5)
WBC: 4.6 10*3/uL (ref 4.0–10.5)

## 2014-11-06 LAB — BASIC METABOLIC PANEL
ANION GAP: 9 (ref 5–15)
BUN: 13 mg/dL (ref 6–20)
CHLORIDE: 98 mmol/L — AB (ref 101–111)
CO2: 28 mmol/L (ref 22–32)
Calcium: 9.5 mg/dL (ref 8.9–10.3)
Creatinine, Ser: 1.32 mg/dL — ABNORMAL HIGH (ref 0.61–1.24)
GFR calc Af Amer: >60 mL/min (ref >60)
GFR, EST NON AFRICAN AMERICAN: 56 mL/min — AB (ref >60)
GLUCOSE: 148 mg/dL — AB (ref 65–99)
POTASSIUM: 3.9 mmol/L (ref 3.5–5.1)
Sodium: 135 mmol/L (ref 135–145)

## 2014-11-06 MED ORDER — VALACYCLOVIR HCL 500 MG PO TABS
1000.0000 mg | ORAL_TABLET | Freq: Once | ORAL | Status: AC
  Administered 2014-11-06: 1000 mg via ORAL
  Filled 2014-11-06 (×2): qty 2

## 2014-11-06 MED ORDER — VALACYCLOVIR HCL 1 G PO TABS: 1000.0000 mg | ORAL_TABLET | Freq: Three times a day (TID) | ORAL | Status: AC

## 2014-11-06 MED ORDER — ONDANSETRON 4 MG PO TBDP
4.0000 mg | ORAL_TABLET | Freq: Once | ORAL | Status: AC
  Administered 2014-11-06: 4 mg via ORAL
  Filled 2014-11-06: qty 1

## 2014-11-06 MED ORDER — HYDROMORPHONE HCL 1 MG/ML IJ SOLN
2.0000 mg | Freq: Once | INTRAMUSCULAR | Status: AC
  Administered 2014-11-06: 2 mg via INTRAMUSCULAR
  Filled 2014-11-06: qty 2

## 2014-11-06 MED ORDER — OXYCODONE-ACETAMINOPHEN 5-325 MG PO TABS: 1.0000 | ORAL_TABLET | Freq: Four times a day (QID) | ORAL | Status: DC | PRN

## 2014-11-06 NOTE — ED Provider Notes (Signed)
CSN: [id number]     Arrival date & time 11/06/14  1002 History   First MD Initiated Contact with Patient 11/06/14 1008     Chief Complaint  Patient presents with  . Rash    (Consider location/radiation/quality/duration/timing/severity/associated sxs/prior Treatment) HPI Comments: Patients with history of complete heart block with pacemaker/defibrillator, recently replaced on 10/25/14 -- presents with complaint of rash for the past 3 days. Patient reports redness and blistering of L upper chest, shoulder, neck, and ear. Does not cross midline. No fevers. Rash is burning in nature. He had burning prodrome. No fever, N/V/D. No new medications. No h/o shingles.   Patient is a 64 y.o. male presenting with rash. The history is provided by the patient and medical records.  Rash Associated symptoms: no abdominal pain, no diarrhea, no fever, no headaches, no myalgias, no nausea, no sore throat and not vomiting     Past Medical History  Diagnosis Date  . DVT (deep venous thrombosis) ([place])     a. 2007 RLE: S/P ankle surgery;  b. 03/2013 LLE DVT & PE-->Xarelto.  . Embolism, pulmonary with infarction ([place])     a. 2007 RLE: S/P ankle surgery;  b. 03/2013 LLE DVT & PE-->Xarelto.  . Third degree heart block ([place])     a. 03/2013 s/p MDT ADDRL1 Adapta DC PPM, ser # [id number] H.  . Hypercoagulable state ([place])   . Musculoskeletal neck pain   . Hypertension   . Presence of permanent cardiac pacemaker     a. 03/2013 s/p MDT ADDRL1 Adapta DC PPM, ser # [id number] H.  . Hypercholesterolemia   . Chronic combined systolic and diastolic CHF (congestive heart failure) ([place])     a. 03/2013 Echo EF 50-55%;  b. 07/2013 Echo EF 20-25%, diff HK, Gr3 DD;  c. 08/2014 Echo: EF 15%, diff HK, mild AI/MR, mildly dil LA/RV, mod TR, PASP 49mmHg.  . Sleep apnea     does not wear mask (09/02/2014)  . Pneumonia ~ 04/2014; 09/02/2014  . Nonischemic cardiomyopathy ([place])     a. 07/2013 Echo: EF 20-25%;  b. 07/2013 Myoview: Large inferior,  lateral, apical scar w/ HK, no ischemia, EF 17%;  b. 08/2014 Echo: EF 15%, diff HK.  Marland Kitchen Atrial tachycardia ([place])   . Atrial flutter ([place])     a. 08/2014 presented w/ aflutter->converted on amio;  b. CHA2DS2VASc = 2 -->Xarelto.  Marland Kitchen AICD (automatic cardioverter/defibrillator) present 0919/2016    BIV    Past Surgical History  Procedure Laterality Date  . Foot fracture surgery Right 2007  . Permanent pacemaker insertion N/A 03/15/2013    Procedure: PERMANENT PACEMAKER INSERTION;  Surgeon: Evans Lance, MD;  Location: [facility] CATH LAB;  Service: Cardiovascular;  Laterality: N/A;  . Inguinal hernia repair Right 1980's  . Insert / replace / remove pacemaker      MDT ADDRL1 pacemaker implanted by Dr Lovena Le for complete heart block  . Fracture surgery    . Electrophysiologic study N/A 09/07/2014    Procedure: A-Flutter;  Surgeon: Evans Lance, MD;  Location: [facility] CV LAB;  Service: Cardiovascular;  Laterality: N/A;  . Cardiac catheterization N/A 10/17/2014    Procedure: Right/Left Heart Cath and Coronary Angiography;  Surgeon: Larey Dresser, MD;  Location: [facility] CV LAB;  Service: Cardiovascular;  Laterality: N/A;  . Ep implantable device  10/24/2014    BIV  . Ep implantable device N/A 10/24/2014    Procedure: BiV ICD Upgrade;  Surgeon: Evans Lance, MD;  Location: [facility]  CV LAB;  Service: Cardiovascular;  Laterality: N/A;   Family History  Problem Relation Age of Onset  . Diabetes type II Mother   . Heart disease Father    Social History  Substance Use Topics  . Smoking status: Former Smoker -- 1.50 packs/day for 10 years    Types: Cigarettes  . Smokeless tobacco: Never Used     Comment: 09/02/2014  "quit smoking 20-30 yr ago"  . Alcohol Use: No    Review of Systems  Constitutional: Negative for fever.  HENT: Negative for rhinorrhea and sore throat.   Eyes: Negative for redness.  Respiratory: Negative for cough.   Cardiovascular: Negative for chest pain.   Gastrointestinal: Negative for nausea, vomiting, abdominal pain and diarrhea.  Genitourinary: Negative for dysuria.  Musculoskeletal: Negative for myalgias.  Skin: Positive for color change and rash.  Neurological: Negative for headaches.    Allergies  Review of patient's allergies indicates no known allergies.  Home Medications   Prior to Admission medications   Medication Sig Start Date End Date Taking? Authorizing Provider  acetaminophen (TYLENOL) 325 MG tablet Take 1-2 tablets (325-650 mg total) by mouth every 4 (four) hours as needed for mild pain. 10/25/14   Patsey Berthold, NP  aspirin EC 81 MG tablet Take 1 tablet (81 mg total) by mouth daily. 10/28/14   Larey Dresser, MD  carvedilol (COREG) 3.125 MG tablet Take 1 tablet (3.125 mg total) by mouth 2 (two) times daily with a meal. 10/12/14   Evans Lance, MD  digoxin (LANOXIN) 0.125 MG tablet Take 1 tablet (125 mcg total) by mouth daily. 10/12/14   Evans Lance, MD  furosemide (LASIX) 20 MG tablet Take 1 tablet (20 mg total) by mouth 2 (two) times daily. 10/25/14   Amber Sena Slate, NP  hydrALAZINE (APRESOLINE) 10 MG tablet Take 20 mg by mouth 3 (three) times daily.    Historical Provider, MD  HYDROcodone-acetaminophen (NORCO/VICODIN) 5-325 MG per tablet Take 1-2 tablets by mouth every 4 (four) hours as needed for moderate pain. 10/28/14   Larey Dresser, MD  isosorbide dinitrate (ISORDIL) 10 MG tablet Take 1 tablet (10 mg total) by mouth 3 (three) times daily. 10/12/14   Evans Lance, MD  lisinopril (PRINIVIL,ZESTRIL) 5 MG tablet Take 5 mg by mouth daily.    Historical Provider, MD  polyethylene glycol (MIRALAX / GLYCOLAX) packet Take 17 g by mouth daily. 10/21/14   [place] N Rumley, DO  simvastatin (ZOCOR) 40 MG tablet Take 1 tablet (40 mg total) by mouth daily. 10/28/14   Larey Dresser, MD  spironolactone (ALDACTONE) 25 MG tablet Take 1 tablet (25 mg total) by mouth daily. 09/21/14   Larey Dresser, MD   BP 114/60 mmHg  Pulse 100   Temp(Src) 99.1 F (37.3 C) (Oral)  Resp 18  Ht 6\' 1"  (1.854 m)  Wt 195 lb (88.451 kg)  BMI 25.73 kg/m2  SpO2 99%   Physical Exam  Constitutional: He appears well-developed and well-nourished.  HENT:  Head: Normocephalic and atraumatic.  Eyes: Conjunctivae are normal. Right eye exhibits no discharge. Left eye exhibits no discharge.  Neck: Normal range of motion. Neck supple.  Cardiovascular: Normal rate, regular rhythm and normal heart sounds.   Pulmonary/Chest: Effort normal and breath sounds normal. No respiratory distress. He exhibits no tenderness.  Healing pacemaker pocket. Non-tender, no drainage over pocket.   Abdominal: Soft. There is no tenderness.  Neurological: He is alert.  Skin: Skin is warm and  dry. Rash noted.  Red vesicular rash on L upper chest, upper back, neck, top of shoulder extending to ear c/w shingles. Does not cross the mid-line.   Psychiatric: He has a normal mood and affect.  Nursing note and vitals reviewed.   ED Course  Procedures (including critical care time) Labs Review Labs Reviewed  BASIC METABOLIC PANEL - Abnormal; Notable for the following:    Chloride 98 (*)    Glucose, Bld 148 (*)    Creatinine, Ser 1.32 (*)    GFR calc non Af Amer 56 (*)    All other components within normal limits  CBC    Imaging Review No results found. I have personally reviewed and evaluated these images and lab results as part of my medical decision-making.   EKG Interpretation None       10:39 AM Patient seen and examined. Work-up initiated. Patient has shingles.   Vital signs reviewed and are as follows: BP 114/60 mmHg  Pulse 100  Temp(Src) 99.1 F (37.3 C) (Oral)  Resp 18  Ht 6\' 1"  (1.854 m)  Wt 195 lb (88.451 kg)  BMI 25.73 kg/m2  SpO2 99%  10:53 AM D/w Dr. Maryan Rued who has seen. Pain meds ordered.   Patient feeling better. Labs reviewed. Will d/c to home with Valtrex and pain medication.   Patient counseled on use of narcotic pain  medications. Counseled not to combine these medications with others containing tylenol. Urged not to drink alcohol, drive, or perform any other activities that requires focus while taking these medications. The patient verbalizes understanding and agrees with the plan.  Encouraged PCP f/u this week for recheck.   MDM   Final diagnoses:  Shingles   Patient with rash c/w shingles. Surgical site is well-appearing. Pt is well-appearing, non-toxic. Labs reassuring. Started on Valtrex. Per Up-to-date no role for steroids.     Carlisle Cater, PA-C 11/06/14 [place], MD 11/06/14 2137

## 2014-11-06 NOTE — Discharge Instructions (Signed)
Please read and follow all provided instructions.  Your diagnoses today include:  1. Shingles     Tests performed today include:  Blood counts and electrolytes - normal  Vital signs. See below for your results today.   Medications prescribed:   Valtrex - medication to treat shingles  Take any prescribed medications only as directed.  Home care instructions:  Follow any educational materials contained in this packet.  BE VERY CAREFUL not to take multiple medicines containing Tylenol (also called acetaminophen). Doing so can lead to an overdose which can damage your liver and cause liver failure and possibly death.   Follow-up instructions: Please follow-up with your primary care provider in the next 3 days for further evaluation of your symptoms.   Return instructions:   Please return to the Emergency Department if you experience worsening symptoms.   Please return if you have any other emergent concerns.  Additional Information:  Your vital signs today were: BP 101/60 mmHg   Pulse 80   Temp(Src) 99.1 F (37.3 C) (Oral)   Resp 16   Ht 6\' 1"  (1.854 m)   Wt 195 lb (88.451 kg)   BMI 25.73 kg/m2   SpO2 99% If your blood pressure (BP) was elevated above 135/85 this visit, please have this repeated by your doctor within one month. --------------

## 2014-11-06 NOTE — ED Notes (Signed)
Pt reports he had a defibrillator placed last week and noticed pain and rash to neck beginning this past Thursday.  Pt reports neck stiffness, itching and burning along with rash.  The surgical incision is WNL.  No drainage or redness noted.

## 2014-11-09 ENCOUNTER — Other Ambulatory Visit: Payer: Self-pay | Admitting: Family Medicine

## 2014-11-09 MED ORDER — LISINOPRIL 5 MG PO TABS
5.0000 mg | ORAL_TABLET | Freq: Every day | ORAL | Status: DC
Start: 2014-11-09 — End: 2015-06-26

## 2014-11-09 NOTE — Telephone Encounter (Signed)
Needs refill on lisinopril

## 2014-11-11 ENCOUNTER — Telehealth: Payer: Self-pay | Admitting: Family Medicine

## 2014-11-11 NOTE — Telephone Encounter (Signed)
Return called to patient regarding bowel movements. Patient stated he still has not had a bowel movement in a couple of days.  Requesting to use Epsom salt for relief.  Patient denies any nausea/vomiting, abdomen is not hard nor distended. Per Dr. Gerlean Ren stated patient can increase the Miralax and ok to use the Epsom salt if the Miralax does not help.  Patient advised to make an appointment if he develops any abdominal pain, n/v, blood in stool.  Patient stated understanding.  Derl Barrow, RN

## 2014-11-11 NOTE — Telephone Encounter (Signed)
Pt have been using Miralax for constipation for the past 2 times and haven't had any results.  Want to know if he could go back to using Epsom salt.

## 2014-11-14 ENCOUNTER — Encounter: Payer: Self-pay | Admitting: Internal Medicine

## 2014-11-15 ENCOUNTER — Encounter: Payer: Self-pay | Admitting: Family Medicine

## 2014-11-15 ENCOUNTER — Ambulatory Visit (INDEPENDENT_AMBULATORY_CARE_PROVIDER_SITE_OTHER): Payer: BC Managed Care – PPO | Admitting: Family Medicine

## 2014-11-15 VITALS — BP 135/73 | HR 85 | Temp 98.3°F | Ht 73.0 in | Wt 193.0 lb

## 2014-11-15 DIAGNOSIS — B029 Zoster without complications: Secondary | ICD-10-CM

## 2014-11-15 MED ORDER — OXYCODONE-ACETAMINOPHEN 5-325 MG PO TABS: 1.0000 | ORAL_TABLET | Freq: Four times a day (QID) | ORAL | Status: DC | PRN

## 2014-11-15 NOTE — Patient Instructions (Signed)
Thank you so much for coming to visit me today! I am glad you are doing better! I will send in for a few more days of Percocet to help with your pain. Please continue to use Neosporin on the sores.  Follow up if your rash fails to improve.  Thanks again! Dr. Gerlean Ren  Shingles Shingles, which is also known as herpes zoster, is an infection that causes a painful skin rash and fluid-filled blisters. Shingles is not related to genital herpes, which is a sexually transmitted infection.   Shingles only develops in people who:  Have had chickenpox.  Have received the chickenpox vaccine. (This is rare.) CAUSES Shingles is caused by varicella-zoster virus (VZV). This is the same virus that causes chickenpox. After exposure to VZV, the virus stays in the body in an inactive (dormant) state. Shingles develops if the virus reactivates. This can happen many years after the initial exposure to VZV. It is not known what causes this virus to reactivate. RISK FACTORS People who have had chickenpox or received the chickenpox vaccine are at risk for shingles. Infection is more common in people who:  Are older than age 8.  Have a weakened defense (immune) system, such as those with HIV, AIDS, or cancer.  Are taking medicines that weaken the immune system, such as transplant medicines.  Are under great stress. SYMPTOMS Early symptoms of this condition include itching, tingling, and pain in an area on your skin. Pain may be described as burning, stabbing, or throbbing. A few days or weeks after symptoms start, a painful red rash appears, usually on one side of the body in a bandlike or beltlike pattern. The rash eventually turns into fluid-filled blisters that break open, scab over, and dry up in about 2-3 weeks. At any time during the infection, you may also develop:  A fever.  Chills.  A headache.  An upset stomach. DIAGNOSIS This condition is diagnosed with a skin exam. Sometimes, skin or fluid  samples are taken from the blisters before a diagnosis is made. These samples are examined under a microscope or sent to a lab for testing. TREATMENT There is no specific cure for this condition. Your health care provider will probably prescribe medicines to help you manage pain, recover more quickly, and avoid long-term problems. Medicines may include:  Antiviral drugs.  Anti-inflammatory drugs.  Pain medicines. If the area involved is on your face, you may be referred to a specialist, such as an eye doctor (ophthalmologist) or an ear, nose, and throat (ENT) doctor to help you avoid eye problems, chronic pain, or disability. HOME CARE INSTRUCTIONS Medicines  Take medicines only as directed by your health care provider.  Apply an anti-itch or numbing cream to the affected area as directed by your health care provider. Blister and Rash Care  Take a cool bath or apply cool compresses to the area of the rash or blisters as directed by your health care provider. This may help with pain and itching.  Keep your rash covered with a loose bandage (dressing). Wear loose-fitting clothing to help ease the pain of material rubbing against the rash.  Keep your rash and blisters clean with mild soap and cool water or as directed by your health care provider.  Check your rash every day for signs of infection. These include redness, swelling, and pain that lasts or increases.  Do not pick your blisters.  Do not scratch your rash. General Instructions  Rest as directed by your health care provider.  Keep all follow-up visits as directed by your health care provider. This is important.  Until your blisters scab over, your infection can cause chickenpox in people who have never had it or been vaccinated against it. To prevent this from happening, avoid contact with other people, especially:  Babies.  Pregnant women.  Children who have eczema.  Elderly people who have transplants.  People  who have chronic illnesses, such as leukemia or AIDS. SEEK MEDICAL CARE IF:  Your pain is not relieved with prescribed medicines.  Your pain does not get better after the rash heals.  Your rash looks infected. Signs of infection include redness, swelling, and pain that lasts or increases. SEEK IMMEDIATE MEDICAL CARE IF:  The rash is on your face or nose.  You have facial pain, pain around your eye area, or loss of feeling on one side of your face.  You have ear pain or you have ringing in your ear.  You have loss of taste.  Your condition gets worse.   This information is not intended to replace advice given to you by your health care provider. Make sure you discuss any questions you have with your health care provider.   Document Released: 01/21/2005 Document Revised: 02/11/2014 Document Reviewed: 12/02/2013 Elsevier Interactive Patient Education [employer].

## 2014-11-17 DIAGNOSIS — B029 Zoster without complications: Secondary | ICD-10-CM | POA: Insufficient documentation

## 2014-11-17 NOTE — Assessment & Plan Note (Signed)
-   Completed 7 day course of Valtrex - Notes significant improvement in rash - Perocet #15 given. No refills to be given. Discussed spacing out as much as possible and to not take if not necessary. - Follow up if worsens or fails to improve

## 2014-11-17 NOTE — Progress Notes (Signed)
Subjective:     Patient ID: Philip Chavez, male   DOB: February 15, 1950, 64 y.o.   MRN: [id number]  HPI Philip Chavez is a 65yo male presenting today for shingles follow up. - Was seen in ED on 11/06/14 and diagnosed with shingles. Given prescription for Valtrex and instructed to follow up at clinic - Reports completed 7 day course of Valtrex. - Was given Percocet #10 in ED. Reports taking 1-2 per day with good pain relief. Has been spacing them out further and further. - Has been using Neosporin over sores with significant relief in pain - Notes rash is painful and itchy - Rash located on neck, scalp, shoulder, axilla, chest and back - Has noted improvement in rash and pain - Denies fever  Review of Systems Per HPI    Objective:   Physical Exam  Constitutional: He appears well-developed and well-nourished. No distress.  HENT:  Head: Normocephalic and atraumatic.  Cardiovascular: Normal rate and regular rhythm.  Exam reveals no gallop and no friction rub.   No murmur heard. Pulmonary/Chest: Effort normal. No respiratory distress. He has no wheezes. He has no rales.  Skin:  Remnants of rash noted on scalp, neck, chest, and back with abrupt stop at midline with small scabs noted. Excoriations noted.       Assessment and Plan:     Shingles - Completed 7 day course of Valtrex - Notes significant improvement in rash - Perocet #15 given. No refills to be given. Discussed spacing out as much as possible and to not take if not necessary. - Follow up if worsens or fails to improve

## 2014-11-21 ENCOUNTER — Emergency Department (HOSPITAL_COMMUNITY)
Admission: EM | Admit: 2014-11-21 | Discharge: 2014-11-21 | Disposition: A | Discharge status: Home / Self Care | Payer: BC Managed Care – PPO | Attending: Emergency Medicine | Admitting: Emergency Medicine

## 2014-11-21 ENCOUNTER — Telehealth: Payer: Self-pay | Admitting: *Deleted

## 2014-11-21 ENCOUNTER — Encounter (HOSPITAL_COMMUNITY): Payer: Self-pay | Admitting: *Deleted

## 2014-11-21 DIAGNOSIS — Z95810 Presence of automatic (implantable) cardiac defibrillator: Secondary | ICD-10-CM | POA: Diagnosis not present

## 2014-11-21 DIAGNOSIS — B0222 Postherpetic trigeminal neuralgia: Secondary | ICD-10-CM | POA: Insufficient documentation

## 2014-11-21 DIAGNOSIS — I5042 Chronic combined systolic (congestive) and diastolic (congestive) heart failure: Secondary | ICD-10-CM | POA: Insufficient documentation

## 2014-11-21 DIAGNOSIS — B0229 Other postherpetic nervous system involvement: Secondary | ICD-10-CM

## 2014-11-21 DIAGNOSIS — Z86711 Personal history of pulmonary embolism: Secondary | ICD-10-CM | POA: Diagnosis not present

## 2014-11-21 DIAGNOSIS — M7981 Nontraumatic hematoma of soft tissue: Secondary | ICD-10-CM | POA: Insufficient documentation

## 2014-11-21 DIAGNOSIS — Z86718 Personal history of other venous thrombosis and embolism: Secondary | ICD-10-CM | POA: Insufficient documentation

## 2014-11-21 DIAGNOSIS — Z8669 Personal history of other diseases of the nervous system and sense organs: Secondary | ICD-10-CM | POA: Insufficient documentation

## 2014-11-21 DIAGNOSIS — Z8701 Personal history of pneumonia (recurrent): Secondary | ICD-10-CM | POA: Diagnosis not present

## 2014-11-21 DIAGNOSIS — T148XXA Other injury of unspecified body region, initial encounter: Secondary | ICD-10-CM

## 2014-11-21 DIAGNOSIS — Z862 Personal history of diseases of the blood and blood-forming organs and certain disorders involving the immune mechanism: Secondary | ICD-10-CM | POA: Diagnosis not present

## 2014-11-21 DIAGNOSIS — Z87891 Personal history of nicotine dependence: Secondary | ICD-10-CM | POA: Insufficient documentation

## 2014-11-21 DIAGNOSIS — Z7982 Long term (current) use of aspirin: Secondary | ICD-10-CM | POA: Diagnosis not present

## 2014-11-21 DIAGNOSIS — Z9889 Other specified postprocedural states: Secondary | ICD-10-CM | POA: Insufficient documentation

## 2014-11-21 DIAGNOSIS — I10 Essential (primary) hypertension: Secondary | ICD-10-CM | POA: Diagnosis not present

## 2014-11-21 DIAGNOSIS — Z79899 Other long term (current) drug therapy: Secondary | ICD-10-CM | POA: Insufficient documentation

## 2014-11-21 DIAGNOSIS — E7800 Pure hypercholesterolemia, unspecified: Secondary | ICD-10-CM | POA: Insufficient documentation

## 2014-11-21 DIAGNOSIS — M79622 Pain in left upper arm: Secondary | ICD-10-CM | POA: Diagnosis present

## 2014-11-21 LAB — PROTIME-INR
INR: 1.11 (ref 0.00–1.49)
Prothrombin Time: 14.5 seconds (ref 11.6–15.2)

## 2014-11-21 MED ORDER — GABAPENTIN 300 MG PO CAPS: 300.0000 mg | ORAL_CAPSULE | ORAL | Status: DC

## 2014-11-21 MED ORDER — FENTANYL CITRATE (PF) 100 MCG/2ML IJ SOLN
50.0000 ug | Freq: Once | INTRAMUSCULAR | Status: AC
Start: 2014-11-21 — End: 2014-11-21
  Administered 2014-11-21: 50 ug via INTRAVENOUS
  Filled 2014-11-21: qty 2

## 2014-11-21 MED ORDER — FENTANYL CITRATE (PF) 100 MCG/2ML IJ SOLN
50.0000 ug | Freq: Once | INTRAMUSCULAR | Status: AC
  Administered 2014-11-21: 50 ug via INTRAMUSCULAR
  Filled 2014-11-21: qty 2

## 2014-11-21 NOTE — Telephone Encounter (Signed)
Patient called stating he did not feel right.  He his arm was turn blue and whole left side had numbness and some tingling feeling.  Patient kept saying I don't feel right.  Advised patient that he should go to the ED for evaluation.  Patient stated he wanted to be seen in clinic.  Advised patient that he should go to ED because they can do more in there than we can in clinic.  He informed the nurse he has a pacemaker and defibrillator.  Nurse advised patient that he should go to ED again especially with his history.  Patient stated he wanted to speak to his doctor, advised patient that his doctor is not in clinic today and to go to ED.  Patient hung up. Precept with Dr. Erin Hearing; agreed patient should be seen in ED.  Derl Barrow, RN

## 2014-11-21 NOTE — ED Notes (Signed)
Pt ambulatory to nurses station. Pt updated on wait time.

## 2014-11-21 NOTE — ED Provider Notes (Signed)
CSN: [id number]     Arrival date & time 11/21/14  1555 History   First MD Initiated Contact with Patient 11/21/14 1902     Chief Complaint  Patient presents with  . Arm Pain  . Bleeding/Bruising   HPI   64 year old male patient with a history of complete heart block with pacemaker defibrillator placement on 10/25/2014 presents today with rash and pain. Patient was seen on 11/06/2014 and diagnosed with shingles affecting his left neck and face. Patient was prescribed Valtrex at that time, reports this improved the rash. But continues to have severe pain to the area of the rash. Patient reports he was taking Percocet but these have not improved his symptoms. Patient denies any signs of infection, headache, fever, chills, nausea, vomiting, or spreading of the rash. Patient also notes that he has discoloration over his left bicep appears to be a bruise, no known trauma to the arm. This area is nontender, he has full active range of motion of the extremity. No other bruising noted. The patient not currently on any blood thinners.   Past Medical History  Diagnosis Date  . DVT (deep venous thrombosis) ([place])     a. 2007 RLE: S/P ankle surgery;  b. 03/2013 LLE DVT & PE-->Xarelto.  . Embolism, pulmonary with infarction ([place])     a. 2007 RLE: S/P ankle surgery;  b. 03/2013 LLE DVT & PE-->Xarelto.  . Third degree heart block ([place])     a. 03/2013 s/p MDT ADDRL1 Adapta DC PPM, ser # [id number] H.  . Hypercoagulable state ([place])   . Musculoskeletal neck pain   . Hypertension   . Presence of permanent cardiac pacemaker     a. 03/2013 s/p MDT ADDRL1 Adapta DC PPM, ser # [id number] H.  . Hypercholesterolemia   . Chronic combined systolic and diastolic CHF (congestive heart failure) ([place])     a. 03/2013 Echo EF 50-55%;  b. 07/2013 Echo EF 20-25%, diff HK, Gr3 DD;  c. 08/2014 Echo: EF 15%, diff HK, mild AI/MR, mildly dil LA/RV, mod TR, PASP 68mmHg.  . Sleep apnea     does not wear mask (09/02/2014)  . Pneumonia ~  04/2014; 09/02/2014  . Nonischemic cardiomyopathy ([place])     a. 07/2013 Echo: EF 20-25%;  b. 07/2013 Myoview: Large inferior, lateral, apical scar w/ HK, no ischemia, EF 17%;  b. 08/2014 Echo: EF 15%, diff HK.  Marland Kitchen Atrial tachycardia ([place])   . Atrial flutter ([place])     a. 08/2014 presented w/ aflutter->converted on amio;  b. CHA2DS2VASc = 2 -->Xarelto.  Marland Kitchen AICD (automatic cardioverter/defibrillator) present 0919/2016    BIV    Past Surgical History  Procedure Laterality Date  . Foot fracture surgery Right 2007  . Permanent pacemaker insertion N/A 03/15/2013    Procedure: PERMANENT PACEMAKER INSERTION;  Surgeon: Evans Lance, MD;  Location: [facility] CATH LAB;  Service: Cardiovascular;  Laterality: N/A;  . Inguinal hernia repair Right 1980's  . Insert / replace / remove pacemaker      MDT ADDRL1 pacemaker implanted by Dr Lovena Le for complete heart block  . Fracture surgery    . Electrophysiologic study N/A 09/07/2014    Procedure: A-Flutter;  Surgeon: Evans Lance, MD;  Location: [facility] CV LAB;  Service: Cardiovascular;  Laterality: N/A;  . Cardiac catheterization N/A 10/17/2014    Procedure: Right/Left Heart Cath and Coronary Angiography;  Surgeon: Larey Dresser, MD;  Location: [facility] CV LAB;  Service: Cardiovascular;  Laterality: N/A;  . Ep implantable  device  10/24/2014    BIV  . Ep implantable device N/A 10/24/2014    Procedure: BiV ICD Upgrade;  Surgeon: Evans Lance, MD;  Location: [facility] CV LAB;  Service: Cardiovascular;  Laterality: N/A;   Family History  Problem Relation Age of Onset  . Diabetes type II Mother   . Heart disease Father    Social History  Substance Use Topics  . Smoking status: Former Smoker -- 1.50 packs/day for 10 years    Types: Cigarettes  . Smokeless tobacco: Never Used     Comment: 09/02/2014  "quit smoking 20-30 yr ago"  . Alcohol Use: No    Review of Systems  All other systems reviewed and are negative.   Allergies  Review of patient's  allergies indicates no known allergies.  Home Medications   Prior to Admission medications   Medication Sig Start Date End Date Taking? Authorizing Provider  aspirin EC 81 MG tablet Take 1 tablet (81 mg total) by mouth daily. 10/28/14  Yes Larey Dresser, MD  carvedilol (COREG) 3.125 MG tablet Take 1 tablet (3.125 mg total) by mouth 2 (two) times daily with a meal. 10/12/14  Yes Evans Lance, MD  digoxin (LANOXIN) 0.125 MG tablet Take 1 tablet (125 mcg total) by mouth daily. 10/12/14  Yes Evans Lance, MD  furosemide (LASIX) 20 MG tablet Take 1 tablet (20 mg total) by mouth 2 (two) times daily. 10/25/14  Yes Amber Sena Slate, NP  hydrALAZINE (APRESOLINE) 10 MG tablet Take 20 mg by mouth 3 (three) times daily.   Yes Historical Provider, MD  isosorbide dinitrate (ISORDIL) 10 MG tablet Take 1 tablet (10 mg total) by mouth 3 (three) times daily. 10/12/14  Yes Evans Lance, MD  oxyCODONE-acetaminophen (PERCOCET/ROXICET) 5-325 MG tablet Take 1-2 tablets by mouth every 6 (six) hours as needed for severe pain. 11/15/14  Yes Panacea N Rumley, DO  polyethylene glycol (MIRALAX / GLYCOLAX) packet Take 17 g by mouth daily. Patient taking differently: Take 17 g by mouth daily as needed for mild constipation.  10/21/14  Yes Foster N Rumley, DO  simvastatin (ZOCOR) 40 MG tablet Take 1 tablet (40 mg total) by mouth daily. 10/28/14  Yes Larey Dresser, MD  spironolactone (ALDACTONE) 25 MG tablet Take 1 tablet (25 mg total) by mouth daily. 09/21/14  Yes Larey Dresser, MD  acetaminophen (TYLENOL) 325 MG tablet Take 1-2 tablets (325-650 mg total) by mouth every 4 (four) hours as needed for mild pain. 10/25/14   Amber Sena Slate, NP  gabapentin (NEURONTIN) 300 MG capsule Take 1 capsule (300 mg total) by mouth as directed. Day 1: 300 mg, Day 2: 300 mg twice daily, Day 3: 300 mg 3 times daily- continue 300 mg 3 times daily as needed 11/21/14   Okey Regal, PA-C  HYDROcodone-acetaminophen (NORCO/VICODIN) 5-325 MG per tablet  Take 1-2 tablets by mouth every 4 (four) hours as needed for moderate pain. 10/28/14   Larey Dresser, MD  lisinopril (PRINIVIL,ZESTRIL) 5 MG tablet Take 1 tablet (5 mg total) by mouth daily. 11/09/14   [place] N Rumley, DO   BP 133/78 mmHg  Pulse 79  Temp(Src) 99 F (37.2 C) (Oral)  Resp 19  SpO2 98%   Physical Exam  Constitutional: He is oriented to person, place, and time. He appears well-developed and well-nourished.  HENT:  Head: Normocephalic and atraumatic.  Eyes: Conjunctivae are normal. Pupils are equal, round, and reactive to light. Right eye exhibits no discharge.  Left eye exhibits no discharge. No scleral icterus.  Neck: Normal range of motion. No JVD present. No tracheal deviation present.  Pulmonary/Chest: Effort normal. No stridor.  Neurological: He is alert and oriented to person, place, and time. Coordination normal.  Skin:  Healing rash to the left face and head neck. No signs of obvious cellulitis. Isolated nonraised non-tender hematoma to the left bicep, nontender to palpation  Psychiatric: He has a normal mood and affect. His behavior is normal. Judgment and thought content normal.  Nursing note and vitals reviewed.     ED Course  Procedures (including critical care time) Labs Review Labs Reviewed  PROTIME-INR    Imaging Review No results found. I have personally reviewed and evaluated these images and lab results as part of my medical decision-making.   EKG Interpretation   Date/Time:  Monday November 21 2014 16:25:22 EDT Ventricular Rate:  82 PR Interval:    QRS Duration: 148 QT Interval:  436 QTC Calculation: 509 R Axis:   -89 Text Interpretation:  Ventricular-paced rhythm Biventricular pacemaker  detected Abnormal ECG ED PHYSICIAN INTERPRETATION AVAILABLE IN [facility] Confirmed by TEST, Record ([id number]) on 11/22/2014 7:32:52 AM      MDM   Final diagnoses:  Postherpetic neuralgia  Hematoma    Labs: PT/ INR- 14.5/  1.11  Imaging:  Consults:  Therapeutics: Fentanyl  Discharge Meds: gabapentin   Assessment/Plan: Pt presents with post herpatic neuralgia. Pain improved here in the ED. Counseling on expected pain and effective options. Pt has small aread of bruising to left bicep, no on blood thinners, PT/INR normal, localized. Pt instructed to follow up with PCP for further eval and management of pain.        Okey Regal, PA-C 11/22/14 [place], MD 11/23/14 2129

## 2014-11-21 NOTE — ED Notes (Addendum)
Pt reports recent shingles to left shoulder and neck area. Now has bruising to left upper arm today. Recent defib placement two weeks ago, has been off xarelto x 3 weeks. Hx of blood clots.

## 2014-11-21 NOTE — Discharge Instructions (Signed)
Please follow-up with her primary care provider for reevaluation and further management.  Postherpetic Neuralgia Postherpetic neuralgia (PHN) is nerve pain that occurs after a shingles infection. Shingles is a painful rash that appears on one side of the body, usually on your trunk or face. Shingles is caused by the varicella-zoster virus. This is the same virus that causes chickenpox. In people who have had chickenpox, the virus can resurface years later and cause shingles. You may have PHN if you continue to have pain for 3 months after your shingles rash has gone away. PHN appears in the same area where you had the shingles rash. For most people, PHN goes away within 1 year.  Getting a vaccination for shingles can prevent PHN. This vaccine is recommended for people older than 50. It may prevent shingles and may also lower your risk of PHN if you do get shingles. CAUSES PHN is caused by damage to your nerves from the varicella-zoster virus. This damage makes your nerves overly sensitive.  RISK FACTORS Aging is the biggest risk factor for developing PHN. Most people who get PHN are older than 46. Other risk factors include:  Having very bad pain before your shingles rash starts.  Having a very bad rash.  Having shingles in the nerve that supplies your face and eye (trigeminal nerve). SIGNS AND SYMPTOMS Pain is the main symptom of PHN. The pain is often very bad and may be described as stabbing, burning, or feeling like an electric shock. The pain may come and go or may be there all the time. Pain may be triggered by light touches on the skin or changes in temperature. You may have itching along with the pain. DIAGNOSIS  Your health care provider may diagnose PHN based on your symptoms and your history of shingles. Lab studies and other diagnostic tests are usually not needed. TREATMENT  There is no cure for PHN. Treatment for PHN will focus on pain relief. Over-the-counter pain relievers do not  usually relieve PHN pain. You may need to work with a pain specialist. Treatment may include:  Antidepressant medicines to help with pain and improve sleep.  Antiseizure medicines to relieve nerve pain.  Strong pain relievers (opioids).  A numbing patch worn on the skin (lidocaine patch). HOME CARE INSTRUCTIONS It may take a long time to recover from PHN. Work closely with your health care provider, and have a good support system at home.   Take all medicines as directed by your health care provider.  Wear loose, comfortable clothing.  Cover sensitive areas with a dressing to reduce friction from clothing rubbing on the area.  If cold does not make your pain worse, try applying a cool compress or cooling gel pack to the area.  Talk to your health care provider if you feel depressed or desperate. Living with long-term pain can be depressing. SEEK MEDICAL CARE IF:  Your medicine is not helping.  You are struggling to manage your pain at home.   This information is not intended to replace advice given to you by your health care provider. Make sure you discuss any questions you have with your health care provider.   Document Released: 04/13/2002 Document Revised: 02/11/2014 Document Reviewed: 01/12/2013 Elsevier Interactive Patient Education [employer].

## 2014-11-22 ENCOUNTER — Telehealth: Payer: Self-pay | Admitting: Family Medicine

## 2014-11-22 NOTE — Telephone Encounter (Signed)
Attempted to contact x2 without response. Will be out of office until 11/28/14. Will attempt to contact him again next week. If he is having worsening symptoms or would like to be seen in the office, please schedule an appointment.

## 2014-11-22 NOTE — Telephone Encounter (Signed)
Pt called and would like to speak to Dr. Gerlean Ren about his visit to the ER and having shingles also. jw

## 2014-11-23 ENCOUNTER — Other Ambulatory Visit: Payer: Self-pay | Admitting: Family Medicine

## 2014-11-23 NOTE — Telephone Encounter (Signed)
It appears this was discontinued off this patient's Rx list.  Has not been filled since 09/2013.  Patient has appt on 10/25.  Please have him follow up with PCP for this medication, as it is a controlled substance.  Will defer to her for refills.  Please advise patient.

## 2014-11-24 NOTE — Telephone Encounter (Signed)
Patient has an appt on 11/29/2014

## 2014-11-24 NOTE — Telephone Encounter (Signed)
Spoke with patient and he is aware of this.  States that he still had medication from last year and only takes it when he needs it.  States that he hasn't slept well in the past few night and ran out of medication earlier in the week.  Will plan to discuss at appt. Willamina Grieshop,CMA

## 2014-11-28 ENCOUNTER — Other Ambulatory Visit: Payer: Self-pay | Admitting: Family Medicine

## 2014-11-29 ENCOUNTER — Ambulatory Visit: Payer: BC Managed Care – PPO | Admitting: Family Medicine

## 2014-11-30 ENCOUNTER — Encounter: Payer: BC Managed Care – PPO | Admitting: Nurse Practitioner

## 2014-12-07 ENCOUNTER — Encounter: Payer: Self-pay | Admitting: Family Medicine

## 2014-12-07 ENCOUNTER — Ambulatory Visit (INDEPENDENT_AMBULATORY_CARE_PROVIDER_SITE_OTHER): Payer: BC Managed Care – PPO | Admitting: Family Medicine

## 2014-12-07 ENCOUNTER — Encounter: Payer: BC Managed Care – PPO | Admitting: Nurse Practitioner

## 2014-12-07 VITALS — BP 100/60 | HR 80 | Temp 98.3°F | Ht 73.0 in | Wt 197.0 lb

## 2014-12-07 DIAGNOSIS — B0229 Other postherpetic nervous system involvement: Secondary | ICD-10-CM | POA: Diagnosis not present

## 2014-12-07 DIAGNOSIS — B029 Zoster without complications: Secondary | ICD-10-CM | POA: Diagnosis not present

## 2014-12-07 MED ORDER — GABAPENTIN 300 MG PO CAPS: 600.0000 mg | ORAL_CAPSULE | Freq: Three times a day (TID) | ORAL | Status: DC

## 2014-12-07 MED ORDER — KETOROLAC TROMETHAMINE 30 MG/ML IJ SOLN
30.0000 mg | Freq: Once | INTRAMUSCULAR | Status: AC
  Administered 2014-12-07: 30 mg via INTRAMUSCULAR

## 2014-12-07 NOTE — Patient Instructions (Signed)
Thank you so much for coming to visit today. Please increase your gabapentin to two tablets three times a day. We will also give you a Toradol shot for the pain while you are here in the office. You may also use over the counter medications to help with pain.  Thanks again! Dr. Gerlean Ren  Postherpetic Neuralgia Postherpetic neuralgia (PHN) is nerve pain that occurs after a shingles infection. Shingles is a painful rash that appears on one side of the body, usually on your trunk or face. Shingles is caused by the varicella-zoster virus. This is the same virus that causes chickenpox. In people who have had chickenpox, the virus can resurface years later and cause shingles. You may have PHN if you continue to have pain for 3 months after your shingles rash has gone away. PHN appears in the same area where you had the shingles rash. For most people, PHN goes away within 1 year.  Getting a vaccination for shingles can prevent PHN. This vaccine is recommended for people older than 50. It may prevent shingles and may also lower your risk of PHN if you do get shingles. CAUSES PHN is caused by damage to your nerves from the varicella-zoster virus. This damage makes your nerves overly sensitive.  RISK FACTORS Aging is the biggest risk factor for developing PHN. Most people who get PHN are older than 37. Other risk factors include:  Having very bad pain before your shingles rash starts.  Having a very bad rash.  Having shingles in the nerve that supplies your face and eye (trigeminal nerve). SIGNS AND SYMPTOMS Pain is the main symptom of PHN. The pain is often very bad and may be described as stabbing, burning, or feeling like an electric shock. The pain may come and go or may be there all the time. Pain may be triggered by light touches on the skin or changes in temperature. You may have itching along with the pain. DIAGNOSIS  Your health care provider may diagnose PHN based on your symptoms and your  history of shingles. Lab studies and other diagnostic tests are usually not needed. TREATMENT  There is no cure for PHN. Treatment for PHN will focus on pain relief. Over-the-counter pain relievers do not usually relieve PHN pain. You may need to work with a pain specialist. Treatment may include:  Antidepressant medicines to help with pain and improve sleep.  Antiseizure medicines to relieve nerve pain.  HOME CARE INSTRUCTIONS It may take a long time to recover from PHN. Work closely with your health care provider, and have a good support system at home.   Take all medicines as directed by your health care provider.  Wear loose, comfortable clothing.  Cover sensitive areas with a dressing to reduce friction from clothing rubbing on the area.  If cold does not make your pain worse, try applying a cool compress or cooling gel pack to the area.  Talk to your health care provider if you feel depressed or desperate. Living with long-term pain can be depressing. SEEK MEDICAL CARE IF:  Your medicine is not helping.  You are struggling to manage your pain at home.   This information is not intended to replace advice given to you by your health care provider. Make sure you discuss any questions you have with your health care provider.   Document Released: 04/13/2002 Document Revised: 02/11/2014 Document Reviewed: 01/12/2013 Elsevier Interactive Patient Education [employer].

## 2014-12-10 DIAGNOSIS — B0229 Other postherpetic nervous system involvement: Secondary | ICD-10-CM | POA: Insufficient documentation

## 2014-12-10 NOTE — Assessment & Plan Note (Addendum)
-   Gabapentin increased to 600mg  TID - Shot of Toradol given in office. Note that he requested that the nurse inject it into his veins--nurse discussed that it is administered IM. - Will NOT be prescribed opioids for this given suspected abuse based on behavior in office. Was wondering halls and insisting that prescription for Percocet and Vicodin be given. It was discussed at last visit that these were not long term medications. 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

## 2014-12-10 NOTE — Progress Notes (Signed)
Subjective:     Patient ID: Philip Chavez, male   DOB: April 28, 1950, 64 y.o.   MRN: [id number]  HPI Philip Chavez is a 64yo male presenting today to discuss his recent history of shingles. - Reports significant improvement in rash - States nerve pain is worse - Pain goes from side of head to shoulder - Pain occurs every four hours - Has been using Percocet, Vicodin, and Gabapentin frequently throughout day for pain, which helps improve his pain - Went to ED on 11/21/14 for same complaint.  Review of Systems Per HPI    Objective:   Physical Exam  Constitutional: He appears well-developed and well-nourished.  Cardiovascular: Normal rate and regular rhythm.  Exam reveals no gallop and no friction rub.   No murmur heard. Pulmonary/Chest: Effort normal. No respiratory distress. He has no wheezes. He has no rales.  Skin:  Healed lesions on head, neck, and back, significantly improved from last exam  Psychiatric:  Was wondering halls and asking inappropriate questions of nurses and physicians throughout visit      Assessment and Plan:     Post herpetic neuralgia - Gabapentin increased to 600mg  TID - Shot of Toradol given in office. Note that he requested that the nurse inject it into his veins--nurse discussed that it is administered IM. - Will NOT be prescribed opioids for this given suspected abuse based on behavior in office. Was wondering halls and insisting that prescription for Percocet and Vicodin be given. It was discussed at last visit that these were not long term medications. 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

## 2014-12-13 ENCOUNTER — Other Ambulatory Visit: Payer: Self-pay | Admitting: Family Medicine

## 2014-12-13 NOTE — Telephone Encounter (Signed)
Pt called and would like something for sleep. The shingles is keeping him up at night and he is not sleeping. jw

## 2014-12-14 MED ORDER — MELATONIN 1 MG PO TABS: 0.5000 | ORAL_TABLET | Freq: Every evening | ORAL | Status: DC | PRN

## 2014-12-14 NOTE — Telephone Encounter (Signed)
PT informed of below. Zimmerman Rumple, April D, CMA  

## 2014-12-14 NOTE — Telephone Encounter (Signed)
Prescription for melatonin sent to pharmacy.

## 2014-12-15 NOTE — Telephone Encounter (Signed)
Pt called because he needs a refill on his Gabapentin called in. He is out and in pain. He doesn't want to wait all weekend for this to be filled. jw

## 2014-12-16 MED ORDER — GABAPENTIN 300 MG PO CAPS: 600.0000 mg | ORAL_CAPSULE | Freq: Three times a day (TID) | ORAL | Status: DC

## 2014-12-16 NOTE — Telephone Encounter (Signed)
Informed pt of below and he stated that he would try but sometimes the pain gets so bad that he has to take more.  I instructed him that if he is taking more than prescribed that there may be a time that it wont be refilled, due to taking more than supposed to. I told him if he was needing to take more he needed to come in and discuss this with PCP to see if there was something that needs to be changed. Forwarding to PCP for an FYI. Katharina Caper, April D, [place]

## 2014-12-16 NOTE — Telephone Encounter (Signed)
Prescription refilled. Please reiterate that he is only supposed to be taking 2 tablets three times a day and not more.

## 2014-12-24 ENCOUNTER — Other Ambulatory Visit: Payer: Self-pay | Admitting: Family Medicine

## 2014-12-26 NOTE — Telephone Encounter (Signed)
Patient says he is completely out of meds. Really needs med asap bc he is in a lot of pain.

## 2014-12-26 NOTE — Telephone Encounter (Signed)
He is not taking as prescribed and additional refills will not be given since I am unsure how much or how frequently he is taking this medication. Please have him schedule an appointment to be seen. Please let him know that narcotics such as percocet or vicodin will not be prescribed as requested at his last visit.  Dr. Gerlean Ren

## 2014-12-27 ENCOUNTER — Other Ambulatory Visit: Payer: Self-pay | Admitting: *Deleted

## 2014-12-28 ENCOUNTER — Ambulatory Visit (INDEPENDENT_AMBULATORY_CARE_PROVIDER_SITE_OTHER): Payer: BC Managed Care – PPO | Admitting: Family Medicine

## 2014-12-28 ENCOUNTER — Encounter: Payer: Self-pay | Admitting: Family Medicine

## 2014-12-28 VITALS — BP 134/66 | HR 95 | Temp 98.2°F | Ht 73.0 in | Wt 200.6 lb

## 2014-12-28 DIAGNOSIS — B0229 Other postherpetic nervous system involvement: Secondary | ICD-10-CM | POA: Diagnosis not present

## 2014-12-28 MED ORDER — LIDOCAINE 5 % EX PTCH: 1.0000 | MEDICATED_PATCH | CUTANEOUS | Status: DC

## 2014-12-28 MED ORDER — GABAPENTIN 300 MG PO CAPS: 600.0000 mg | ORAL_CAPSULE | Freq: Three times a day (TID) | ORAL | Status: DC

## 2014-12-28 NOTE — Telephone Encounter (Signed)
PT informed and has appointment today to discuss this. Katharina Caper, April D, [place]

## 2014-12-28 NOTE — Patient Instructions (Signed)
Thank you so much for coming to visit today! I will give you a refill of Gabapentin today to take two tablets three times a day. Please ONLY take this as prescribed. You gabapentin will be refilled on 01/26/15 if needed, so please try to make it last until that time. I have also given you a patch to help with the pain. You may take Ibuprofen for break through pain. We will give you a toradol shot today before you leave to help with your pain.   Thanks again! Dr. Gerlean Ren

## 2014-12-30 NOTE — Progress Notes (Signed)
Subjective:     Patient ID: Philip Chavez, male   DOB: 06/08/1950, 64 y.o.   MRN: [id number]  HPI Philip Chavez is a 64yo male presenting today for follow up for postherpetic neuralgia.  - Ran out of gabapentin for six days ago. Admits not taking as prescribed, instead taking multiple tablets throughout the day as needed. - Reports gabapentin does help when he is able to take it - States he has been taking Ibuprofen and Tylenol as needed for breakthrough pain - Reports Toradol shot helped significantly at last visit - Pain located over left shoulder up into neck in location of prior shingles infection  Review of Systems Per HPI    Objective:   Physical Exam  Constitutional: He appears well-developed and well-nourished. No distress.  Cardiovascular: Normal rate.  Exam reveals no gallop and no friction rub.   No murmur heard. Pulmonary/Chest: Effort normal. No respiratory distress. He has no wheezes.  Skin:  Well healed scars from previous shingles infection noted over left shoulder up throughout left neck  Psychiatric: He has a normal mood and affect. His behavior is normal.       Assessment and Plan:     Post herpetic neuralgia - Refill for Gabapentin given. Counseled on importance of taking medication as prescribed. Amitriptyline contraindicated given significant cardiac history. - Lidocaine patch prescribed to help with pain - Toradol shot given in office - Unable to give urine for UDS. Appeared much clearer than at last visit--suspect Philip Chavez had taken to much Vicodin and Percocet at last visit and is now out of these medications.

## 2014-12-30 NOTE — Assessment & Plan Note (Signed)
-   Refill for Gabapentin given. Counseled on importance of taking medication as prescribed. Amitriptyline contraindicated given significant cardiac history. - Lidocaine patch prescribed to help with pain - Toradol shot given in office - Unable to give urine for UDS. Appeared much clearer than at last visit--suspect Philip Chavez had taken to much Vicodin and Percocet at last visit and is now out of these medications.

## 2015-01-04 ENCOUNTER — Ambulatory Visit: Payer: BC Managed Care – PPO | Admitting: Family Medicine

## 2015-01-11 ENCOUNTER — Ambulatory Visit (INDEPENDENT_AMBULATORY_CARE_PROVIDER_SITE_OTHER): Payer: BC Managed Care – PPO | Admitting: Nurse Practitioner

## 2015-01-11 ENCOUNTER — Encounter: Payer: Self-pay | Admitting: Nurse Practitioner

## 2015-01-11 ENCOUNTER — Encounter: Payer: Self-pay | Admitting: Internal Medicine

## 2015-01-11 VITALS — BP 110/70 | HR 81 | Ht 73.0 in | Wt 208.0 lb

## 2015-01-11 DIAGNOSIS — I483 Typical atrial flutter: Secondary | ICD-10-CM

## 2015-01-11 DIAGNOSIS — I5022 Chronic systolic (congestive) heart failure: Secondary | ICD-10-CM

## 2015-01-11 LAB — CUP PACEART INCLINIC DEVICE CHECK
Implantable Lead Implant Date: 20150209
Implantable Lead Implant Date: 20160919
Implantable Lead Implant Date: 20160919
Implantable Lead Location: 753859
Implantable Lead Model: 4598
Implantable Lead Model: 5076
Implantable Lead Model: 6935
MDC IDC LEAD LOCATION: 753858
MDC IDC LEAD LOCATION: 753860
MDC IDC SESS DTM: 20161207143902

## 2015-01-11 MED ORDER — IVABRADINE HCL 5 MG PO TABS
5.0000 mg | ORAL_TABLET | Freq: Two times a day (BID) | ORAL | Status: DC
Start: 2015-01-11 — End: 2015-03-27

## 2015-01-11 NOTE — Progress Notes (Signed)
Electrophysiology Office Note Date: 01/11/2015  ID:  Philip Chavez, DOB July 22, 1950, MRN [id number]  PCP: Junie Panning, DO Primary Cardiologist: Aundra Dubin Electrophysiologist: Lovena Le  CC: CHF follow-up; post CRT upgrade  Philip Chavez is a 64 y.o. male seen today for Dr Lovena Le.  He presents today for routine electrophysiology followup.  Since CRT upgrade, the patient reports doing very well. He has had an episode of singles and is still having issues with pain. He is also still having erectile dysfunction despite Viagra. Shortness of breath is stable. He denies chest pain, palpitations, PND, orthopnea, nausea, vomiting, dizziness, syncope, edema, weight gain, or early satiety.  He has not had ICD shocks.   Device History: MDT dual chamber PPM implanted 2015 for CHB; upgrade to MDT CRTD  2016 for worsening LV function and CHF History of appropriate therapy: No History of AAD therapy: No   Past Medical History  Diagnosis Date  . DVT (deep venous thrombosis) ([place])     a. 2007 RLE: S/P ankle surgery;  b. 03/2013 LLE DVT & PE-->Xarelto.  . Embolism, pulmonary with infarction ([place])     a. 2007 RLE: S/P ankle surgery;  b. 03/2013 LLE DVT & PE-->Xarelto.  . Third degree heart block ([place])     a. 03/2013 s/p MDT ADDRL1 Adapta DC PPM, ser # [id number] H.  . Hypercoagulable state ([place])   . Musculoskeletal neck pain   . Hypertension   . Presence of permanent cardiac pacemaker     a. 03/2013 s/p MDT ADDRL1 Adapta DC PPM, ser # [id number] H.  . Hypercholesterolemia   . Chronic combined systolic and diastolic CHF (congestive heart failure) ([place])     a. 03/2013 Echo EF 50-55%;  b. 07/2013 Echo EF 20-25%, diff HK, Gr3 DD;  c. 08/2014 Echo: EF 15%, diff HK, mild AI/MR, mildly dil LA/RV, mod TR, PASP 59mmHg.  . Sleep apnea     does not wear mask (09/02/2014)  . Pneumonia ~ 04/2014; 09/02/2014  . Nonischemic cardiomyopathy ([place])     a. 07/2013 Echo: EF 20-25%;  b. 07/2013 Myoview: Large inferior, lateral, apical  scar w/ HK, no ischemia, EF 17%;  b. 08/2014 Echo: EF 15%, diff HK.  Marland Kitchen Atrial tachycardia ([place])   . Atrial flutter ([place])     a. 08/2014 presented w/ aflutter->converted on amio;  b. CHA2DS2VASc = 2 -->Xarelto.  Marland Kitchen AICD (automatic cardioverter/defibrillator) present 0919/2016    BIV    Past Surgical History  Procedure Laterality Date  . Foot fracture surgery Right 2007  . Permanent pacemaker insertion N/A 03/15/2013    Procedure: PERMANENT PACEMAKER INSERTION;  Surgeon: Evans Lance, MD;  Location: [facility] CATH LAB;  Service: Cardiovascular;  Laterality: N/A;  . Inguinal hernia repair Right 1980's  . Insert / replace / remove pacemaker      MDT ADDRL1 pacemaker implanted by Dr Lovena Le for complete heart block  . Fracture surgery    . Electrophysiologic study N/A 09/07/2014    Procedure: A-Flutter;  Surgeon: Evans Lance, MD;  Location: [place] CV LAB;  Service: Cardiovascular;  Laterality: N/A;  . Cardiac catheterization N/A 10/17/2014    Procedure: Right/Left Heart Cath and Coronary Angiography;  Surgeon: Larey Dresser, MD;  Location: [place] CV LAB;  Service: Cardiovascular;  Laterality: N/A;  . Ep implantable device  10/24/2014    BIV  . Ep implantable device N/A 10/24/2014    Procedure: BiV ICD Upgrade;  Surgeon: Evans Lance, MD;  Location: [place] CV LAB;  Service: Cardiovascular;  Laterality: N/A;    Current Outpatient Prescriptions  Medication Sig Dispense Refill  . acetaminophen (TYLENOL) 325 MG tablet Take 1-2 tablets (325-650 mg total) by mouth every 4 (four) hours as needed for mild pain.    Marland Kitchen aspirin EC 81 MG tablet Take 1 tablet (81 mg total) by mouth daily. 30 tablet 3  . carvedilol (COREG) 3.125 MG tablet Take 1 tablet (3.125 mg total) by mouth 2 (two) times daily with a meal. 60 tablet 12  . digoxin (LANOXIN) 0.125 MG tablet Take 1 tablet (125 mcg total) by mouth daily. 30 tablet 12  . furosemide (LASIX) 20 MG tablet Take 1 tablet (20 mg total) by mouth 2 (two)  times daily. 90 tablet 0  . gabapentin (NEURONTIN) 300 MG capsule Take 2 capsules (600 mg total) by mouth 3 (three) times daily. 180 capsule 0  . hydrALAZINE (APRESOLINE) 10 MG tablet Take 20 mg by mouth 3 (three) times daily.    . isosorbide dinitrate (ISORDIL) 10 MG tablet Take 1 tablet (10 mg total) by mouth 3 (three) times daily. 90 tablet 12  . lisinopril (PRINIVIL,ZESTRIL) 5 MG tablet Take 1 tablet (5 mg total) by mouth daily. 90 tablet 3  . Melatonin 1 MG TABS Take 0.5 tablets (0.5 mg total) by mouth at bedtime as needed. (Patient taking differently: Take 0.5 tablets by mouth at bedtime as needed (SLEEP). ) 15 tablet 1  . simvastatin (ZOCOR) 40 MG tablet Take 1 tablet (40 mg total) by mouth daily. 30 tablet 6  . spironolactone (ALDACTONE) 25 MG tablet Take 1 tablet (25 mg total) by mouth daily. 30 tablet 3  . ivabradine (CORLANOR) 5 MG TABS tablet Take 1 tablet (5 mg total) by mouth 2 (two) times daily with a meal. 60 tablet 4   No current facility-administered medications for this visit.    Allergies:   Review of patient's allergies indicates no known allergies.   Social History: Social History   Social History  . Marital Status: Married    Spouse Name: N/A  . Number of Children: N/A  . Years of Education: N/A   Occupational History  . Not on file.   Social History Main Topics  . Smoking status: Former Smoker -- 1.50 packs/day for 10 years    Types: Cigarettes  . Smokeless tobacco: Never Used     Comment: 09/02/2014  "quit smoking 20-30 yr ago"  . Alcohol Use: No  . Drug Use: No  . Sexual Activity: Yes   Other Topics Concern  . Not on file   Social History Narrative    Family History: Family History  Problem Relation Age of Onset  . Diabetes type II Mother   . Heart disease Father   . Hypertension Neg Hx   . Heart attack Neg Hx   . Arrhythmia Father   . Arrhythmia Sister   . Stroke Neg Hx     Review of Systems: All other systems reviewed and are otherwise  negative except as noted above.   Physical Exam: VS:  BP 110/70 mmHg  Pulse 81  Ht 6\' 1"  (1.854 m)  Wt 208 lb (94.348 kg)  BMI 27.45 kg/m2 , BMI Body mass index is 27.45 kg/(m^2).  GEN- The patient is well appearing, alert and oriented x 3 today.   HEENT: normocephalic, atraumatic; sclera clear, conjunctiva pink; hearing intact; oropharynx clear; neck supple Lungs- Clear to ausculation bilaterally, normal work of breathing.  No wheezes, rales, rhonchi Heart- Regular rate  and rhythm (paced) GI- soft, non-tender, non-distended, bowel sounds present Extremities- no clubbing, cyanosis, or edema; DP/PT/radial pulses 2+ bilaterally MS- no significant deformity or atrophy Skin- warm and dry, no rash or lesion; ICD pocket well healed Psych- euthymic mood, full affect Neuro- strength and sensation are intact  ICD interrogation- reviewed in detail today,  See PACEART report  EKG:  EKG is ordered today. The ekg ordered today shows sinus rhythm with V pacing  Recent Labs: 09/08/2014: ALT 173* 09/21/2014: B Natriuretic Peptide 689.6* 11/06/2014: BUN 13; Creatinine, Ser 1.32*; Hemoglobin 13.3; Platelets 152; Potassium 3.9; Sodium 135   Wt Readings from Last 3 Encounters:  01/11/15 208 lb (94.348 kg)  12/28/14 200 lb 9.6 oz (90.992 kg)  12/07/14 197 lb (89.359 kg)     Other studies Reviewed: Additional studies/ records that were reviewed today include: Dr Lovena Le and Dr Claris Gladden office notes, hospital records  Assessment and Plan:  1.  Chronic systolic dysfunction euvolemic today Stable on an appropriate medical regimen Normal ICD function - device reprogrammed to chronic outputs today See [facility] report No changes today Dr Aundra Dubin is considering genetic testing for LMNA or SCN5A mutations Enroll in [facility] clinic today Echo 04/2015 to evaluate EF post CRT upgrade  2.  Atrial flutter S/p ablation Maintaining SR by device interrogation today Dr Aundra Dubin stopped Xarelto 10/2014 Will monitor  remotely for recurrence of atrial arrhythmias. Would need to resume [place] at that time if they occur for [facility] of at least 2  3.  Elevated sinus rates Will try Corlanor today Follow up with ICM clinic and remote transmission in 2 weeks for evaluation of heart rates.     Current medicines are reviewed at length with the patient today.   The patient does not have concerns regarding his medicines.  The following changes were made today:  Add Corlanor 5mg  twice daily  Labs/ tests ordered today include:  Orders Placed This Encounter  Procedures  . EKG 12-Lead     Disposition:   Follow up with [facility] transmissions, ICM clinic, Dr Lovena Le in 3 months (will cancel appt for later this month)   Signed, Chanetta Marshall, NP 01/11/2015 2:17 PM  [place]  [id number] [phone] (office) [phone] (fax)

## 2015-01-11 NOTE — Patient Instructions (Addendum)
Medication Instructions:   START TAKING CORLANOR 5 MG TWICE A DAY      If you need a refill on your cardiac medications before your next appointment, please call your pharmacy.  Labwork:  NONE ORDER TODAY    Testing/Procedures:  NONE ORDER TODAY    Follow-Up:  3  MONTHS WITH DR Lovena Le    Any Other Special Instructions Will Be Listed Below (If Applicable).

## 2015-01-12 NOTE — Progress Notes (Signed)
Patient referred to [facility] clinic by Chanetta Marshall, NP.  ICM intro given at office visit and explained program.  He agreed to monthly ICM follow up.  Explained transmission will be scheduled for 2 weeks are the request of Amber.  Will send patient letter with date of schedule transmission, 01/25/2015.

## 2015-01-16 ENCOUNTER — Encounter: Payer: BC Managed Care – PPO | Admitting: Nurse Practitioner

## 2015-01-16 ENCOUNTER — Other Ambulatory Visit (HOSPITAL_COMMUNITY): Payer: Self-pay | Admitting: Cardiology

## 2015-01-18 ENCOUNTER — Other Ambulatory Visit (HOSPITAL_COMMUNITY): Payer: Self-pay | Admitting: Cardiology

## 2015-01-20 ENCOUNTER — Other Ambulatory Visit: Payer: Self-pay | Admitting: Family Medicine

## 2015-01-20 NOTE — Telephone Encounter (Signed)
Pt called and needs a refill on his Furosemide 40 mg called in. jw

## 2015-01-24 ENCOUNTER — Encounter: Payer: BC Managed Care – PPO | Admitting: Internal Medicine

## 2015-01-25 ENCOUNTER — Telehealth: Payer: Self-pay | Admitting: Internal Medicine

## 2015-01-25 ENCOUNTER — Telehealth: Payer: Self-pay | Admitting: Cardiology

## 2015-01-25 ENCOUNTER — Telehealth: Payer: Self-pay | Admitting: Family Medicine

## 2015-01-25 ENCOUNTER — Ambulatory Visit (INDEPENDENT_AMBULATORY_CARE_PROVIDER_SITE_OTHER): Payer: BC Managed Care – PPO

## 2015-01-25 DIAGNOSIS — I5022 Chronic systolic (congestive) heart failure: Secondary | ICD-10-CM

## 2015-01-25 DIAGNOSIS — Z95810 Presence of automatic (implantable) cardiac defibrillator: Secondary | ICD-10-CM

## 2015-01-25 MED ORDER — GABAPENTIN 300 MG PO CAPS: 600.0000 mg | ORAL_CAPSULE | Freq: Three times a day (TID) | ORAL | Status: DC

## 2015-01-25 NOTE — Telephone Encounter (Signed)
Pt called and needs a refill on his Gabapentin called in. jw

## 2015-01-25 NOTE — Telephone Encounter (Signed)
error 

## 2015-01-25 NOTE — Telephone Encounter (Signed)
Done

## 2015-01-25 NOTE — Telephone Encounter (Signed)
Spoke with pt and reminded pt of remote transmission that is due today. Pt verbalized understanding.   

## 2015-01-26 ENCOUNTER — Telehealth: Payer: Self-pay

## 2015-01-26 ENCOUNTER — Other Ambulatory Visit: Payer: Self-pay | Admitting: Family Medicine

## 2015-01-26 NOTE — Telephone Encounter (Signed)
His cardiologist sent 60 tablets of Corlanor with four refills to the pharmacy, so he should have some at the pharmacy waiting to be picked up.

## 2015-01-26 NOTE — Telephone Encounter (Signed)
Spoke to pt. He will call the pharmacy to make sure the Rx is ready. Ottis Stain, CMA

## 2015-01-26 NOTE — Telephone Encounter (Signed)
Pt called because he was given Corlanor at the hospital and he was given enough for 7 days. He would like to know if the doctor is going to refill this? He doesn't know if this is helping or not. He also would like someone to let him know if he has to pick this up or of the doctor wants him to stop taking this. jw

## 2015-01-26 NOTE — Telephone Encounter (Signed)
1st ICM transmission received.  Attempted 1st ICM encounter call and left message for return call.

## 2015-01-27 NOTE — Progress Notes (Signed)
EPIC Encounter for ICM Monitoring  Patient Name: Philip Chavez is a 64 y.o. male Date: 01/27/2015 Primary Care Physican: Junie Panning, DO Primary Cardiologist: Aundra Dubin Electrophysiologist: Lovena Le Dry Weight: 200 lb   Bi-V Pacing 97.7%      In the past month, have you:  1. Gained more than 2 pounds in a day or more than 5 pounds in a week? No, but has gained 4 lbs in the last month and he contributes the gain to food.    2. Had changes in your medications (with verification of current medications)? no  3. Had more shortness of breath than is usual for you? no  4. Limited your activity because of shortness of breath? no  5. Not been able to sleep because of shortness of breath? no  6. Had increased swelling in your feet or ankles? no  7. Had symptoms of dehydration (dizziness, dry mouth, increased thirst, decreased urine output) no  8. Had changes in sodium restriction? no  9. Been compliant with medication? Yes   ICM trend:      Follow-up plan: ICM clinic phone appointment on 02/15/2015.  Optiovl thoracic impedance below baseline ~12/07/2014 with a few days at baseline.  Impedance returned to baseline 01/25/2015.   Education given regarding following low sodium diet.  Patient stated for Thanksgiving that he and his wife, fixed so much food they had it for leftovers x 7 days. He reported the foods were probably high in sodium.  Explained that have foods high in sodium for a couple of days is not usual for the holidays but should resume low sodium diet as soon as possible after having holiday foods.  Explained salt should be limited to 2000 mg daily and fluid intake to 64 oz daily.  Explained risk of HF exacerbation when eating high sodium diet.  No arrhythmia episodes notes on transmission.  Education given to call should he experience any HF symptoms.   No changes today.     Copy of note sent to patient's primary care physician, primary cardiologist, and device following  physician.  Rosalene Billings, RN, CCM 01/27/2015 9:29 AM

## 2015-01-27 NOTE — Telephone Encounter (Signed)
Spoke with patient.

## 2015-01-30 NOTE — Progress Notes (Signed)
He needs followup with [place] soon please.

## 2015-02-03 ENCOUNTER — Telehealth: Payer: Self-pay | Admitting: Family Medicine

## 2015-02-03 NOTE — Telephone Encounter (Signed)
Patient read in the newspaper that L-Lysine can be good for treating shingles. Patient would like to know if this would be safe for him to take, considering his heart issues. Please advise.

## 2015-02-09 NOTE — Telephone Encounter (Signed)
Patient calling about this request once more. Philip Chavez, ASA

## 2015-02-09 NOTE — Telephone Encounter (Signed)
Pt called to check the status of his requesting to tale L-Lysine 500 mg . This is a dietary supplement. Please call so that if he can take this he wants to start right away. jw

## 2015-02-10 NOTE — Telephone Encounter (Signed)
Discussed with pharmacy. 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 Shingles has low likelihood of recurrence. Would not recommend at this time.

## 2015-02-13 ENCOUNTER — Telehealth: Payer: Self-pay

## 2015-02-13 NOTE — Telephone Encounter (Signed)
Prior auth for [employer] sent to [employer].

## 2015-02-15 ENCOUNTER — Telehealth: Payer: Self-pay | Admitting: Cardiology

## 2015-02-15 ENCOUNTER — Ambulatory Visit (INDEPENDENT_AMBULATORY_CARE_PROVIDER_SITE_OTHER): Payer: BC Managed Care – PPO

## 2015-02-15 DIAGNOSIS — Z95810 Presence of automatic (implantable) cardiac defibrillator: Secondary | ICD-10-CM

## 2015-02-15 DIAGNOSIS — I5022 Chronic systolic (congestive) heart failure: Secondary | ICD-10-CM | POA: Diagnosis not present

## 2015-02-15 NOTE — Telephone Encounter (Signed)
Spoke with pt and reminded pt of remote transmission that is due today. Pt verbalized understanding.   

## 2015-02-17 ENCOUNTER — Telehealth: Payer: Self-pay | Admitting: *Deleted

## 2015-02-17 NOTE — Telephone Encounter (Signed)
Patient called and stated that he has not taken the corlanor for two weeks. He thought that it was just a trial medication. He states that if he does not need to take it, then he does not want to take it. He can be reached at [phone]. Please advise. Thanks, MI

## 2015-02-20 ENCOUNTER — Other Ambulatory Visit: Payer: Self-pay | Admitting: *Deleted

## 2015-02-20 NOTE — Progress Notes (Signed)
EPIC Encounter for ICM Monitoring  Patient Name: Oral Sanabia is a 65 y.o. male Date: 02/20/2015 Primary Care Physican: Junie Panning, DO Primary Cardiologist: Aundra Dubin Electrophysiologist: Lovena Le Dry Weight: 198 lbs  Bi-V Pacing 95.7%       In the past month, have you:  1. Gained more than 2 pounds in a day or more than 5 pounds in a week? no  2. Had changes in your medications (with verification of current medications)? no  3. Had more shortness of breath than is usual for you? no  4. Limited your activity because of shortness of breath? no  5. Not been able to sleep because of shortness of breath? no  6. Had increased swelling in your feet or ankles? no  7. Had symptoms of dehydration (dizziness, dry mouth, increased thirst, decreased urine output) no  8. Had changes in sodium restriction? no  9. Been compliant with medication? Yes   ICM trend: 3 month view 02/16/2015  ICM trend: 1 year view 02/16/2015    Follow-up plan: ICM clinic phone appointment 03/23/2015.  OPTIVOL: fluid index above baseline and daily thoracic impedance below baseline 01/25/2016 to 02/13/2015 and back to baseline on 02/14/2015.   SYMPTOMS: Denied HF symptoms DIET: Encouraged to follow low sodium diet, 2000 mg and fluid intake 64 oz.  MEDS: No change LATEST LABS: 11/06/2014  Creatinine 1.32, BUN 13 and Potassium 3.9  Reviewed HF symptoms and encouraged him to call should he experience any symptoms.  No changes today.  Copy of note sent to patient's primary care physician, primary cardiologist, and device following physician.  Rosalene Billings, RN, CCM 02/20/2015 9:11 AM

## 2015-02-20 NOTE — Telephone Encounter (Signed)
Spoke with patient and let him know he needs to continue with the medication.  He has and will continue and keep his follow up in the CHF clinic on 03/03/15

## 2015-02-21 MED ORDER — ISOSORBIDE DINITRATE 10 MG PO TABS: 10.0000 mg | ORAL_TABLET | Freq: Three times a day (TID) | ORAL | Status: DC

## 2015-02-23 ENCOUNTER — Telehealth: Payer: Self-pay | Admitting: Family Medicine

## 2015-02-23 NOTE — Telephone Encounter (Signed)
Pt calling to speak with provider. States that he has been having a tooth problem for a couple of weeks. Pt has made a dentist appt for his upper right molar. Pt would like to know if any of his medications would interfere with anything. Please advise. Sadie Reynolds, ASA

## 2015-02-23 NOTE — Telephone Encounter (Signed)
Pt calling once more about this. Philip Chavez, ASA ° °

## 2015-02-24 NOTE — Telephone Encounter (Signed)
Returned call to patient. No further questions.

## 2015-03-03 ENCOUNTER — Encounter (HOSPITAL_COMMUNITY): Payer: BC Managed Care – PPO

## 2015-03-15 ENCOUNTER — Other Ambulatory Visit (HOSPITAL_COMMUNITY): Payer: Self-pay | Admitting: *Deleted

## 2015-03-15 DIAGNOSIS — I5022 Chronic systolic (congestive) heart failure: Secondary | ICD-10-CM

## 2015-03-15 MED ORDER — SIMVASTATIN 40 MG PO TABS: 40.0000 mg | ORAL_TABLET | Freq: Every day | ORAL | Status: DC

## 2015-03-20 ENCOUNTER — Telehealth: Payer: Self-pay | Admitting: *Deleted

## 2015-03-20 NOTE — Telephone Encounter (Signed)
Patient called and stated that he has been off of the entresto for a few days. He stated that he does not feel any different than he did when he was taking it. He is no longer able to afford the medication. Per note from January, you had spoke with him and made him aware that he needs to continue taking it and follow up at the [facility] clinic. He did not keep the chf clinic appointment and has now re-scheduled it for 03-28-15. He just kept repeating, could you please have Chanetta Marshall or a nurse contact me to discuss this?

## 2015-03-23 ENCOUNTER — Ambulatory Visit (INDEPENDENT_AMBULATORY_CARE_PROVIDER_SITE_OTHER): Payer: BC Managed Care – PPO

## 2015-03-23 DIAGNOSIS — I5022 Chronic systolic (congestive) heart failure: Secondary | ICD-10-CM

## 2015-03-23 DIAGNOSIS — Z95810 Presence of automatic (implantable) cardiac defibrillator: Secondary | ICD-10-CM | POA: Diagnosis not present

## 2015-03-23 NOTE — Telephone Encounter (Addendum)
Spoke with patient and let him know that his HR's have been better on the Corlanor.  He is due to follow up with CHF clinic and says he is going to do so.  I will forward this to Jenean Lindau, LPN to see if she can help him get his medication

## 2015-03-24 ENCOUNTER — Telehealth: Payer: Self-pay

## 2015-03-24 ENCOUNTER — Telehealth: Payer: Self-pay | Admitting: *Deleted

## 2015-03-24 NOTE — Telephone Encounter (Signed)
Remote ICM transmission received.  Attempted patient call and left message for return call.   

## 2015-03-24 NOTE — Telephone Encounter (Signed)
Late entry:Medication was actually corlanor not entresto.

## 2015-03-24 NOTE — Progress Notes (Signed)
EPIC Encounter for ICM Monitoring  Patient Name: Philip Chavez is a 65 y.o. male Date: 03/24/2015 Primary Care Physican: Junie Panning, DO Primary Cardiologist: Aundra Dubin Electrophysiologist: Lovena Le Dry Weight: 198 lbs  Bi-V Pacing 97.7%       In the past month, have you:  1. Gained more than 2 pounds in a day or more than 5 pounds in a week? no  2. Had changes in your medications (with verification of current medications)? no  3. Had more shortness of breath than is usual for you? no  4. Limited your activity because of shortness of breath? no  5. Not been able to sleep because of shortness of breath? no  6. Had increased swelling in your feet or ankles? no  7. Had symptoms of dehydration (dizziness, dry mouth, increased thirst, decreased urine output) no  8. Had changes in sodium restriction? no  9. Been compliant with medication? Yes   ICM trend: 3 month view    ICM trend: 1 year view   Follow-up plan: ICM clinic phone appointment 05/15/2015.   Optivol thoracic impedance trending along reference line 03/16/2015.   Thoracic impedance has been slightly below reference from 12/29/2014 to 03/13/2015 suggesting fluid accumulation and he denied any symptoms.  Fluid index along threshold above slightly above 40 since 12/29/2014.   Encouraged him to follow low salt diet and to call if he has any fluid symptoms.  No changes today.    Appointments: CHF clinic 03/28/2015 and Dr Lovena Le 04/13/2015.  Copy of note sent to patient's primary care physician, primary cardiologist, and device following physician.  Rosalene Billings, RN, CCM 03/24/2015 3:10 PM

## 2015-03-24 NOTE — Telephone Encounter (Signed)
needs pt asst for corlanor. also leaving 2 bottles at the desk with the paperwork. pt expressed understanding.

## 2015-03-27 ENCOUNTER — Other Ambulatory Visit: Payer: Self-pay

## 2015-03-27 MED ORDER — IVABRADINE HCL 5 MG PO TABS: 5.0000 mg | ORAL_TABLET | Freq: Two times a day (BID) | ORAL | Status: DC

## 2015-03-27 NOTE — Telephone Encounter (Signed)
Spoke with patient.

## 2015-03-27 NOTE — Telephone Encounter (Signed)
Patient dropped off his Assistance Forms for Corlanor. Filled out our part and faxed them to [facility] at [facility] to sign.

## 2015-03-28 ENCOUNTER — Ambulatory Visit (HOSPITAL_COMMUNITY)
Admission: RE | Admit: 2015-03-28 | Discharge: 2015-03-28 | Disposition: A | Discharge status: Home / Self Care | Payer: BC Managed Care – PPO | Source: Ambulatory Visit | Attending: Cardiology | Admitting: Cardiology

## 2015-03-28 VITALS — BP 104/60 | HR 77 | Wt 211.2 lb

## 2015-03-28 DIAGNOSIS — Z950 Presence of cardiac pacemaker: Secondary | ICD-10-CM | POA: Diagnosis not present

## 2015-03-28 DIAGNOSIS — Z7982 Long term (current) use of aspirin: Secondary | ICD-10-CM | POA: Diagnosis not present

## 2015-03-28 DIAGNOSIS — Z8249 Family history of ischemic heart disease and other diseases of the circulatory system: Secondary | ICD-10-CM | POA: Diagnosis not present

## 2015-03-28 DIAGNOSIS — Z86711 Personal history of pulmonary embolism: Secondary | ICD-10-CM

## 2015-03-28 DIAGNOSIS — Z86718 Personal history of other venous thrombosis and embolism: Secondary | ICD-10-CM | POA: Insufficient documentation

## 2015-03-28 DIAGNOSIS — E785 Hyperlipidemia, unspecified: Secondary | ICD-10-CM | POA: Diagnosis not present

## 2015-03-28 DIAGNOSIS — I480 Paroxysmal atrial fibrillation: Secondary | ICD-10-CM | POA: Diagnosis not present

## 2015-03-28 DIAGNOSIS — N189 Chronic kidney disease, unspecified: Secondary | ICD-10-CM | POA: Diagnosis not present

## 2015-03-28 DIAGNOSIS — I2510 Atherosclerotic heart disease of native coronary artery without angina pectoris: Secondary | ICD-10-CM | POA: Diagnosis not present

## 2015-03-28 DIAGNOSIS — I428 Other cardiomyopathies: Secondary | ICD-10-CM | POA: Insufficient documentation

## 2015-03-28 DIAGNOSIS — Z79899 Other long term (current) drug therapy: Secondary | ICD-10-CM | POA: Insufficient documentation

## 2015-03-28 DIAGNOSIS — I4892 Unspecified atrial flutter: Secondary | ICD-10-CM | POA: Insufficient documentation

## 2015-03-28 DIAGNOSIS — I442 Atrioventricular block, complete: Secondary | ICD-10-CM | POA: Diagnosis not present

## 2015-03-28 DIAGNOSIS — I5022 Chronic systolic (congestive) heart failure: Secondary | ICD-10-CM

## 2015-03-28 DIAGNOSIS — G4733 Obstructive sleep apnea (adult) (pediatric): Secondary | ICD-10-CM | POA: Insufficient documentation

## 2015-03-28 LAB — BASIC METABOLIC PANEL
ANION GAP: 10 (ref 5–15)
BUN: 20 mg/dL (ref 6–20)
CALCIUM: 9.2 mg/dL (ref 8.9–10.3)
CO2: 21 mmol/L — AB (ref 22–32)
CREATININE: 1.26 mg/dL — AB (ref 0.61–1.24)
Chloride: 108 mmol/L (ref 101–111)
GFR, EST NON AFRICAN AMERICAN: 59 mL/min — AB (ref >60)
Glucose, Bld: 161 mg/dL — ABNORMAL HIGH (ref 65–99)
Potassium: 4.1 mmol/L (ref 3.5–5.1)
SODIUM: 139 mmol/L (ref 135–145)

## 2015-03-28 LAB — BRAIN NATRIURETIC PEPTIDE: B Natriuretic Peptide: 107 pg/mL — ABNORMAL HIGH (ref 0.0–100.0)

## 2015-03-28 LAB — DIGOXIN LEVEL: DIGOXIN LVL: 0.3 ng/mL — AB (ref 0.8–2.0)

## 2015-03-28 MED ORDER — IVABRADINE HCL 5 MG PO TABS: 5.0000 mg | ORAL_TABLET | Freq: Two times a day (BID) | ORAL | Status: DC

## 2015-03-28 MED ORDER — ISOSORBIDE MONONITRATE ER 30 MG PO TB24: 30.0000 mg | ORAL_TABLET | Freq: Every day | ORAL | Status: DC

## 2015-03-28 MED ORDER — CARVEDILOL 6.25 MG PO TABS: 6.2500 mg | ORAL_TABLET | Freq: Two times a day (BID) | ORAL | Status: DC

## 2015-03-28 NOTE — Progress Notes (Signed)
Medication Samples have been provided to the patient.  Drug name: Corlanor   Qty: 4 bottles  LOT: [id number]  Exp.Date: 4/18  The patient has been instructed regarding the correct time, dose, and frequency of taking this medication, including desired effects and most common side effects.   Philip Chavez 3:02 PM 03/28/2015

## 2015-03-28 NOTE — Patient Instructions (Signed)
Stop Isordil  Start Isosorbide 30 mg daily  Increase Carvedilol to 6.25 mg Twice daily, you can take 2 of your 3.125 mg tablets twice a day until you run out, then we have sent you in a new prescription for 6.25 mg tablets  Labs today  Your physician has requested that you have an echocardiogram. Echocardiography is a painless test that uses sound waves to create images of your heart. It provides your doctor with information about the size and shape of your heart and how well your heart's chambers and valves are working. This procedure takes approximately one hour. There are no restrictions for this procedure.  Your physician recommends that you schedule a follow-up appointment in: 3 months

## 2015-03-29 NOTE — Progress Notes (Signed)
Patient ID: Philip Chavez, male   DOB: 05-24-1950, 65 y.o.   MRN: [id number] EP: Dr. Lovena Le HF cardiologist: Dr Aundra Dubin  65 yo with history of complete heart block/Medtronic PPM, cardiomyopathy, and atrial flutter s/p ablation presents for CHF clinic evaluation.  Patient developed complete heart block in 2/15 and had PPM placed at that time.  He paces his RV continuously.  In 2/15, EF was 50-55% by echo.  By 6/15, EF had fallen to 20-25%.  Cardiolite showed possible scar but no ischemia.  On 09/03/14, he was admitted with dyspnea and found to be in atrial flutter with RLL PNA and with volume overload.  Echo showed EF 15% with diffuse hypokinesis.  He was started on IV Lasix and IV amiodarone. He converted back to NSR.  He subsequently had atrial flutter ablation.  He had right and left heart cath in 9/16, showing nonobstructive CAD and optimized filling pressures with CI 2.09.  He subsequently had upgrade to CRT by Dr Lovena Le.   He is doing well today.  No exertional dyspnea or chest pain.  He does not get lightheaded.  No palpitations.  He can walk up steps without problems. No orthopnea/PND.  Weight is up but he has had more appetite and has been eating a lot.  Main problem has been left shoulder/upper chest burning from post-herpetic neuralgia.     Labs (8/16): K 3.9, creatinine 1.03, AST 106, ALT 173, HCT 45.1, LDL 132 Labs (9/16): K 3.9, creatinine 1.16, HCT 42.5 Labs (10/16): K 3.9, creatinine 1.32, HCT 39.9  PMH: 1. H/o complete heart block: Has Medtronic PPM, placed in 2/15.   2. Cardiomyopathy: Echo (2/15) prior to PPM placement with EF 50-55%.  Echo (6/15) with EF 20-25%,  Cardiolite at that time showed possible scar but no ischemia.  Echo (8/16) with EF 15%, diffuse hypokinesis, mildly dilated RV with normal systolic function, PA systolic pressure 69 mmHg.  LHC/RHC (9/16) with mean RA 4, PA 31/15 mean 20, mean PCWP 8, CI 2.09, 40% mLAD stenosis.  Upgrade to MDT CRT-D in 9/16.  3. CKD 4. Left  leg DVT and PE in 2/15 (post-op).  5. Atrial tachycardia: paroxysmal. 6. Atrial flutter: s/p ablation in 8/16.  7. OSA 8. Hyperlipidemia 9. Shingles with post-herpetic neuralgia.   FH: Father with CHF, complete heart block with PPM.  Sister with complete heart block and PPM.   SH: Retired Curator, nonsmoker, lives in [place]: All systems reviewed and negative except as per HPI  Current Outpatient Prescriptions  Medication Sig Dispense Refill  . acetaminophen (TYLENOL) 325 MG tablet Take 1-2 tablets (325-650 mg total) by mouth every 4 (four) hours as needed for mild pain.    Marland Kitchen aspirin EC 81 MG tablet Take 1 tablet (81 mg total) by mouth daily. 30 tablet 3  . carvedilol (COREG) 6.25 MG tablet Take 1 tablet (6.25 mg total) by mouth 2 (two) times daily with a meal. 60 tablet 3  . digoxin (LANOXIN) 0.125 MG tablet Take 1 tablet (125 mcg total) by mouth daily. 30 tablet 12  . furosemide (LASIX) 20 MG tablet Take 1 tablet (20 mg total) by mouth 2 (two) times daily. 180 tablet 3  . gabapentin (NEURONTIN) 300 MG capsule Take 2 capsules (600 mg total) by mouth 3 (three) times daily. 180 capsule 2  . hydrALAZINE (APRESOLINE) 10 MG tablet Take 20 mg by mouth 3 (three) times daily.    . ivabradine (CORLANOR) 5 MG TABS tablet Take 1 tablet (5  mg total) by mouth 2 (two) times daily with a meal. 120 tablet 11  . lisinopril (PRINIVIL,ZESTRIL) 5 MG tablet Take 1 tablet (5 mg total) by mouth daily. 90 tablet 3  . simvastatin (ZOCOR) 40 MG tablet Take 1 tablet (40 mg total) by mouth daily. 90 tablet 3  . spironolactone (ALDACTONE) 25 MG tablet TAKE ONE TABLET BY MOUTH DAILY 30 tablet 0  . isosorbide mononitrate (IMDUR) 30 MG 24 hr tablet Take 1 tablet (30 mg total) by mouth daily. 30 tablet 3   No current facility-administered medications for this encounter.   BP 104/60 mmHg  Pulse 77  Wt 211 lb 4 oz (95.822 kg)  SpO2 98% General: NAD Neck: No JVD, no thyromegaly or thyroid nodule.  Lungs:  Clear to auscultation bilaterally with normal respiratory effort. CV: Nondisplaced PMI.  Heart regular S1/S2, no S3/S4, no murmur.  No peripheral edema.  No carotid bruit.  Normal pedal pulses.  Abdomen: Soft, nontender, no hepatosplenomegaly, no distention.  Skin: Intact without lesions or rashes.  Neurologic: Alert and oriented x 3.  Psych: Normal affect. Extremities: No clubbing or cyanosis.  HEENT: Normal.   Assessment/Plan: 1. Chronic systolic CHF: Nonischemic cardiomyopathy.  EF 15% on last echo in 8/16.  Patient had borderline reduced EF when PPM was placed in 2/15.  Since then, EF has fallen significantly.  Patient has complete heart block.  Interestingly, father had CHF and CHB with pacemaker, and sounds like sister also had CHB with pacemaker. I am concerned for a genetic dilated cardiomyopathy associated with CHB such as LMNA or SCN5A.  Alternatively, EF could have fallen because of chronic RV pacing.  He has had CRT upgrade.  No significant CAD on recent cath.  On exam today, he is not volume overloaded.  NYHA class II symptoms.  - Continue current digoxin, Lasix, hydralazine, spironolactone, and lisinopril. Check BMET/BNP/digoxin level today.  - Would transition from isordil to Imdur 30 daily for ease of use.  - Increase Coreg to 6.25 mg bid.  - I will arrange for repeat echo (?improvement with CRT). - Will need to consider genetic evaluation to look for LMNA or SCN5A mutations that could be used for familial screening.  2. Atrial flutter: s/p ablation. He is on ASA 81 daily.  If he has atrial fibrillation or recurrent flutter in the future, will need to go back on Xarelto.  3. PE/DVT: >1 year ago and associated with surgery.  4. Hyperlipidemia: He has mild CAD.  Continue ASA 81 and statin.   Followup in 3 months.   Loralie Champagne 03/29/2015

## 2015-03-30 ENCOUNTER — Other Ambulatory Visit: Payer: Self-pay | Admitting: Family Medicine

## 2015-03-30 NOTE — Telephone Encounter (Signed)
Spoke to pt. Informed him of the info below. Ottis Stain, CMA

## 2015-03-30 NOTE — Telephone Encounter (Signed)
Pt called and needs a refill on his Gabapentin. jw

## 2015-03-30 NOTE — Telephone Encounter (Signed)
Please have him check with pharmacy. There should be a refill available if he is taking the medication correctly.

## 2015-04-06 ENCOUNTER — Other Ambulatory Visit: Payer: Self-pay | Admitting: Family Medicine

## 2015-04-06 NOTE — Telephone Encounter (Signed)
Needs appointment

## 2015-04-06 NOTE — Telephone Encounter (Signed)
Pt called and would like another dose of antibiotics for his shingles. He said it is almost gone but he feels one dose will knock this out. Blima Rich

## 2015-04-07 ENCOUNTER — Ambulatory Visit (INDEPENDENT_AMBULATORY_CARE_PROVIDER_SITE_OTHER): Payer: BC Managed Care – PPO | Admitting: Internal Medicine

## 2015-04-07 ENCOUNTER — Encounter: Payer: Self-pay | Admitting: Internal Medicine

## 2015-04-07 VITALS — BP 79/44 | HR 88 | Temp 98.5°F | Wt 211.0 lb

## 2015-04-07 DIAGNOSIS — B0229 Other postherpetic nervous system involvement: Secondary | ICD-10-CM

## 2015-04-07 MED ORDER — GABAPENTIN 300 MG PO CAPS: 900.0000 mg | ORAL_CAPSULE | Freq: Three times a day (TID) | ORAL | Status: DC

## 2015-04-07 NOTE — Telephone Encounter (Signed)
LVM for pt to call the office. Please make him an appt to meet with a White Team Dr. next week. Thank you. Ottis Stain, CMA

## 2015-04-07 NOTE — Assessment & Plan Note (Addendum)
herpes zoster 3 months ago. Currently with postinflammatory hyperpigmented lesions along C3-T4 dermatomes on the left side. Do not cross midline. Will increase patient's gabapentin to 900 mg 3 times a day as patient states that this is helping with the posthepatic neuralgia Holding off on caispacan cream as patient with some small areas of acne which could cause irritation  Discussed receiving the Zostavax to help with possible future outbreaks of shingles, however patient not interested  at this time

## 2015-04-07 NOTE — Patient Instructions (Addendum)
Please increase dose of gabapentin by 1 extra pill. Follow up as needed.

## 2015-04-07 NOTE — Progress Notes (Signed)
   [facility] Kerrin Mo, MD Phone: [phone]  Reason For Visit: F/U postherpetic neuralgia   # Patient with history of herpes zoster 3 months ago. Patient continues to have complaints of post herpetic neuralgia with stinging pain across the C3 to T4 dermatome. has been using gabapentin for pain. He states that this helps significantly. However he feels he could improve his pain control with an increase in the dose of gabapentin. Patient also taking Tylenol for this pain at times.   Past Medical History Reviewed problem list.  Medications- reviewed and updated No additions to family history Social history- patient is a non smoker  Objective: BP 79/44 mmHg  Pulse 88  Temp(Src) 98.5 F (36.9 C) (Oral)  Wt 211 lb (95.709 kg) Gen: NAD, alert, cooperative with exam CV: RRR, good S1/S2, no murmur, Resp: CTABL, no wheezes, non-labored Skin: Hyperpigmented lesions and excoriations along C3 -T4 dermatomes   Assessment/Plan:  Post herpetic neuralgia herpes zoster 3 months ago. Currently with postinflammatory hyperpigmented lesions along C3-T4 dermatomes on the left side. Do not cross midline. Will increase patient's gabapentin to 900 mg 3 times a day as patient states that this is helping with the posthepatic neuralgia Holding off on caispacan cream as patient with some small areas of acne which could cause irritation  Discussed receiving the Zostavax to help with possible future outbreaks of shingles, however patient not interested  at this time

## 2015-04-10 NOTE — Telephone Encounter (Signed)
Pt came in on 04/07/15 and saw Dr. Emmaline Life. Ottis Stain, CMA

## 2015-04-13 ENCOUNTER — Ambulatory Visit (INDEPENDENT_AMBULATORY_CARE_PROVIDER_SITE_OTHER): Payer: BC Managed Care – PPO | Admitting: Internal Medicine

## 2015-04-13 ENCOUNTER — Encounter: Payer: Self-pay | Admitting: Internal Medicine

## 2015-04-13 VITALS — BP 130/62 | HR 84 | Ht 73.0 in | Wt 211.0 lb

## 2015-04-13 DIAGNOSIS — I5022 Chronic systolic (congestive) heart failure: Secondary | ICD-10-CM | POA: Diagnosis not present

## 2015-04-13 LAB — CUP PACEART INCLINIC DEVICE CHECK
Brady Statistic AP VP Percent: 6.24 %
Brady Statistic AP VS Percent: 0.01 %
Brady Statistic AS VS Percent: 1.54 %
Brady Statistic RV Percent Paced: 97.59 %
Date Time Interrogation Session: 20170309162443
HighPow Impedance: 56 Ohm
Implantable Lead Implant Date: 20150209
Implantable Lead Implant Date: 20160919
Implantable Lead Location: 753858
Implantable Lead Location: 753859
Implantable Lead Model: 5076
Implantable Lead Model: 6935
Lead Channel Impedance Value: 323 Ohm
Lead Channel Impedance Value: 323 Ohm
Lead Channel Impedance Value: 342 Ohm
Lead Channel Impedance Value: 456 Ohm
Lead Channel Impedance Value: 532 Ohm
Lead Channel Impedance Value: 627 Ohm
Lead Channel Pacing Threshold Amplitude: 1 V
Lead Channel Pacing Threshold Amplitude: 1.75 V
Lead Channel Pacing Threshold Pulse Width: 0.4 ms
Lead Channel Sensing Intrinsic Amplitude: 3.875 mV
Lead Channel Setting Pacing Amplitude: 2.25 V
Lead Channel Setting Pacing Pulse Width: 0.4 ms
MDC IDC LEAD IMPLANT DT: 20160919
MDC IDC LEAD LOCATION: 753860
MDC IDC LEAD MODEL: 4598
MDC IDC MSMT BATTERY REMAINING LONGEVITY: 67 mo
MDC IDC MSMT BATTERY VOLTAGE: 3 V
MDC IDC MSMT LEADCHNL LV IMPEDANCE VALUE: 380 Ohm
MDC IDC MSMT LEADCHNL LV IMPEDANCE VALUE: 399 Ohm
MDC IDC MSMT LEADCHNL LV IMPEDANCE VALUE: 589 Ohm
MDC IDC MSMT LEADCHNL LV IMPEDANCE VALUE: 627 Ohm
MDC IDC MSMT LEADCHNL LV IMPEDANCE VALUE: 627 Ohm
MDC IDC MSMT LEADCHNL RA IMPEDANCE VALUE: 456 Ohm
MDC IDC MSMT LEADCHNL RA PACING THRESHOLD PULSEWIDTH: 0.4 ms
MDC IDC MSMT LEADCHNL RV IMPEDANCE VALUE: 437 Ohm
MDC IDC MSMT LEADCHNL RV PACING THRESHOLD AMPLITUDE: 1 V
MDC IDC MSMT LEADCHNL RV PACING THRESHOLD PULSEWIDTH: 0.4 ms
MDC IDC SET LEADCHNL LV PACING AMPLITUDE: 3 V
MDC IDC SET LEADCHNL LV PACING PULSEWIDTH: 0.4 ms
MDC IDC SET LEADCHNL RV PACING AMPLITUDE: 2 V
MDC IDC SET LEADCHNL RV SENSING SENSITIVITY: 0.3 mV
MDC IDC STAT BRADY AS VP PERCENT: 92.21 %
MDC IDC STAT BRADY RA PERCENT PACED: 6.25 %

## 2015-04-13 NOTE — Progress Notes (Signed)
HPI [name] returns today for followup. He is a 65 yo man with a h/o complete heart block and HTN, s/p PPM insertion. He has developed LV dysfunction. He had atrial flutter and underwent ablation. He has not had syncope. His EF has been less than 30% for over a year. He has been on maximal medical therapy. His heart failure is class 2. 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 He has undergone BiV ICD implantation. He has been stable.  No Known Allergies   Current Outpatient Prescriptions  Medication Sig Dispense Refill  . acetaminophen (TYLENOL) 325 MG tablet Take 1-2 tablets (325-650 mg total) by mouth every 4 (four) hours as needed for mild pain.    Marland Kitchen aspirin EC 81 MG tablet Take 1 tablet (81 mg total) by mouth daily. 30 tablet 3  . carvedilol (COREG) 6.25 MG tablet Take 1 tablet (6.25 mg total) by mouth 2 (two) times daily with a meal. 60 tablet 3  . digoxin (LANOXIN) 0.125 MG tablet Take 1 tablet (125 mcg total) by mouth daily. 30 tablet 12  . furosemide (LASIX) 20 MG tablet Take 1 tablet (20 mg total) by mouth 2 (two) times daily. 180 tablet 3  . gabapentin (NEURONTIN) 300 MG capsule Take 3 capsules (900 mg total) by mouth 3 (three) times daily. 180 capsule 0  . hydrALAZINE (APRESOLINE) 10 MG tablet Take 20 mg by mouth 3 (three) times daily.    . isosorbide mononitrate (IMDUR) 30 MG 24 hr tablet Take 1 tablet (30 mg total) by mouth daily. 30 tablet 3  . ivabradine (CORLANOR) 5 MG TABS tablet Take 1 tablet (5 mg total) by mouth 2 (two) times daily with a meal. 120 tablet 11  . lisinopril (PRINIVIL,ZESTRIL) 5 MG tablet Take 1 tablet (5 mg total) by mouth daily. 90 tablet 3  . simvastatin (ZOCOR) 40 MG tablet Take 1 tablet (40 mg total) by mouth daily. 90 tablet 3  . spironolactone (ALDACTONE) 25 MG tablet TAKE ONE TABLET BY MOUTH DAILY 30 tablet 0   No current facility-administered medications for this visit.     Past Medical History  Diagnosis Date  . DVT  (deep venous thrombosis) ([place])     a. 2007 RLE: S/P ankle surgery;  b. 03/2013 LLE DVT & PE-->Xarelto.  . Embolism, pulmonary with infarction ([place])     a. 2007 RLE: S/P ankle surgery;  b. 03/2013 LLE DVT & PE-->Xarelto.  . Third degree heart block ([place])     a. 03/2013 s/p MDT ADDRL1 Adapta DC PPM, ser # [id number] H.  . Hypercoagulable state ([place])   . Musculoskeletal neck pain   . Hypertension   . Presence of permanent cardiac pacemaker     a. 03/2013 s/p MDT ADDRL1 Adapta DC PPM, ser # [id number] H.  . Hypercholesterolemia   . Chronic combined systolic and diastolic CHF (congestive heart failure) ([place])     a. 03/2013 Echo EF 50-55%;  b. 07/2013 Echo EF 20-25%, diff HK, Gr3 DD;  c. 08/2014 Echo: EF 15%, diff HK, mild AI/MR, mildly dil LA/RV, mod TR, PASP 21mmHg.  . Sleep apnea     does not wear mask (09/02/2014)  . Pneumonia ~ 04/2014; 09/02/2014  . Nonischemic cardiomyopathy ([place])     a. 07/2013 Echo: EF 20-25%;  b. 07/2013 Myoview: Large inferior, lateral, apical scar w/ HK, no ischemia, EF 17%;  b. 08/2014 Echo: EF 15%, diff HK.  Marland Kitchen Atrial tachycardia ([place])   .  Atrial flutter ([place])     a. 08/2014 presented w/ aflutter->converted on amio;  b. CHA2DS2VASc = 2 -->Xarelto.  Marland Kitchen AICD (automatic cardioverter/defibrillator) present 0919/2016    BIV     ROS:   All systems reviewed and negative except as noted in the HPI.   Past Surgical History  Procedure Laterality Date  . Foot fracture surgery Right 2007  . Permanent pacemaker insertion N/A 03/15/2013    Procedure: PERMANENT PACEMAKER INSERTION;  Surgeon: Evans Lance, MD;  Location: [facility] CATH LAB;  Service: Cardiovascular;  Laterality: N/A;  . Inguinal hernia repair Right 1980's  . Insert / replace / remove pacemaker      MDT ADDRL1 pacemaker implanted by Dr Lovena Le for complete heart block  . Fracture surgery    . Electrophysiologic study N/A 09/07/2014    Procedure: A-Flutter;  Surgeon: Evans Lance, MD;  Location: [place] CV LAB;  Service:  Cardiovascular;  Laterality: N/A;  . Cardiac catheterization N/A 10/17/2014    Procedure: Right/Left Heart Cath and Coronary Angiography;  Surgeon: Larey Dresser, MD;  Location: [facility] CV LAB;  Service: Cardiovascular;  Laterality: N/A;  . Ep implantable device  10/24/2014    BIV  . Ep implantable device N/A 10/24/2014    Procedure: BiV ICD Upgrade;  Surgeon: Evans Lance, MD;  Location: [facility] CV LAB;  Service: Cardiovascular;  Laterality: N/A;     Family History  Problem Relation Age of Onset  . Diabetes type II Mother   . Heart disease Father   . Hypertension Neg Hx   . Heart attack Neg Hx   . Arrhythmia Father   . Arrhythmia Sister   . Stroke Neg Hx      Social History   Social History  . Marital Status: Married    Spouse Name: N/A  . Number of Children: N/A  . Years of Education: N/A   Occupational History  . Not on file.   Social History Main Topics  . Smoking status: Former Smoker -- 1.50 packs/day for 10 years    Types: Cigarettes  . Smokeless tobacco: Never Used     Comment: 09/02/2014  "quit smoking 20-30 yr ago"  . Alcohol Use: No  . Drug Use: No  . Sexual Activity: Yes   Other Topics Concern  . Not on file   Social History Narrative     BP 130/62 mmHg  Pulse 84  Ht 6\' 1"  (1.854 m)  Wt 211 lb (95.709 kg)  BMI 27.84 kg/m2  Physical Exam:  Well appearing middle aged man,NAD HEENT: Unremarkable Neck:  7 cm JVD, no thyromegally Back:  No CVA tenderness Lungs:  Clear with no wheezes, well healed PPM incision. HEART:  Regular rate rhythm, no murmurs, no rubs, no clicks, soft S3., LV heave Abd:  soft, positive bowel sounds, no organomegally, no rebound, no guarding Ext:  2 plus pulses, no edema, no cyanosis, no clubbing Skin:  No rashes no nodules Neuro:  CN II through XII intact, motor grossly intact   DEVICE  Normal device function.  See PaceArt for details. His LV lead is not capturing 3-4. We have reprogrammed  1-coil  Assess/Plan:  1. Chronic systolic heart failure -his symptoms are class 2. He will continue his current meds. He admits to dietary indiscretion and I have asked him to take an extra lasix when he eats a salty meal. 2. ICD - his medtronic Biv ICD has been reprogrammed. We will follow. 3. HTN -  his blood pressure is a bit elevated. He denies medical non-compliance. He admits to dietary sodium excess. Will follow. 4. Complete heart block - he has no conduction today. Will follow.  [name].D.

## 2015-04-13 NOTE — Patient Instructions (Signed)
Medication Instructions:  Your physician recommends that you continue on your current medications as directed. Please refer to the Current Medication list given to you today.   Labwork: None ordered   Testing/Procedures: None ordered   Follow-Up: Your physician wants you to follow-up in: 9 months with Dr Knox Saliva will receive a reminder letter in the mail two months in advance. If you don't receive a letter, please call our office to schedule the follow-up appointment.  Remote monitoring is used to monitor your ICD from home. This monitoring reduces the number of office visits required to check your device to one time per year. It allows [place] to keep an eye on the functioning of your device to ensure it is working properly. You are scheduled for a device check from home on 07/13/15. You may send your transmission at any time that day. If you have a wireless device, the transmission will be sent automatically. After your physician reviews your transmission, you will receive a postcard with your next transmission date.     Any Other Special Instructions Will Be Listed Below (If Applicable).     If you need a refill on your cardiac medications before your next appointment, please call your pharmacy.

## 2015-04-18 ENCOUNTER — Other Ambulatory Visit: Payer: Self-pay

## 2015-04-18 MED ORDER — DIGOXIN 125 MCG PO TABS: 125.0000 ug | ORAL_TABLET | Freq: Every day | ORAL | Status: DC

## 2015-04-24 ENCOUNTER — Other Ambulatory Visit: Payer: Self-pay

## 2015-04-24 ENCOUNTER — Telehealth: Payer: Self-pay | Admitting: *Deleted

## 2015-04-24 MED ORDER — SPIRONOLACTONE 25 MG PO TABS: 25.0000 mg | ORAL_TABLET | Freq: Every day | ORAL | Status: DC

## 2015-04-24 NOTE — Telephone Encounter (Signed)
Patient lvm asking should he still take his Furosemide since he is taking Spironolactone and wants to discuss it with a nurse.

## 2015-04-25 ENCOUNTER — Encounter: Payer: Self-pay | Admitting: Internal Medicine

## 2015-04-25 ENCOUNTER — Other Ambulatory Visit (HOSPITAL_COMMUNITY): Payer: BC Managed Care – PPO

## 2015-04-25 NOTE — Telephone Encounter (Signed)
Spoke w/pt he is agreeable to take both meds

## 2015-05-02 ENCOUNTER — Telehealth (HOSPITAL_COMMUNITY): Payer: Self-pay | Admitting: Pharmacist

## 2015-05-02 NOTE — Telephone Encounter (Signed)
Patient has [place] Pharmacist, community and I verified copay at [facility] of $78/mo. Because he has Pharmacist, community he can use copay cost so that cost to him should be no more than $20/mo. He was denied assistance through [place] because of this. Called patient and left VM to call back.   [name], PharmD, BCPS, CPP Clinical Pharmacist Pager: [phone] Phone: [phone] 05/02/2015 11:13 AM

## 2015-05-10 ENCOUNTER — Ambulatory Visit (HOSPITAL_COMMUNITY): Payer: BC Managed Care – PPO

## 2015-05-10 ENCOUNTER — Other Ambulatory Visit: Payer: Self-pay | Admitting: *Deleted

## 2015-05-10 MED ORDER — GABAPENTIN 300 MG PO CAPS: 900.0000 mg | ORAL_CAPSULE | Freq: Three times a day (TID) | ORAL | Status: DC

## 2015-05-15 ENCOUNTER — Ambulatory Visit (INDEPENDENT_AMBULATORY_CARE_PROVIDER_SITE_OTHER): Payer: BC Managed Care – PPO

## 2015-05-15 ENCOUNTER — Telehealth: Payer: Self-pay | Admitting: Cardiology

## 2015-05-15 DIAGNOSIS — I5022 Chronic systolic (congestive) heart failure: Secondary | ICD-10-CM

## 2015-05-15 DIAGNOSIS — Z95810 Presence of automatic (implantable) cardiac defibrillator: Secondary | ICD-10-CM | POA: Diagnosis not present

## 2015-05-15 NOTE — Telephone Encounter (Signed)
Spoke with pt and reminded pt of remote transmission that is due today. Pt verbalized understanding.   

## 2015-05-16 NOTE — Progress Notes (Signed)
EPIC Encounter for ICM Monitoring  Patient Name: Philip Chavez is a 65 y.o. male Date: 05/16/2015 Primary Care Physican: Junie Panning, DO Primary Cardiologist: Aundra Dubin Electrophysiologist: Lovena Le Dry Weight: 200 lb   Bi-V Pacing 95.9%      In the past month, have you:  1. Gained more than 2 pounds in a day or more than 5 pounds in a week? no  2. Had changes in your medications (with verification of current medications)? no  3. Had more shortness of breath than is usual for you? no  4. Limited your activity because of shortness of breath? no  5. Not been able to sleep because of shortness of breath? no  6. Had increased swelling in your feet or ankles? no  7. Had symptoms of dehydration (dizziness, dry mouth, increased thirst, decreased urine output) no  8. Had changes in sodium restriction? no  9. Been compliant with medication? No, patient stopped Gabapentin without consulting physician.  Advised not to stop meds abruptly without discussing with physician since some meds should be weaned before stopping.   ICM trend: 3 month view for 05/15/2015   ICM trend: 1 year view for 05/15/2015   Follow-up plan: ICM clinic phone appointment on 06/16/2015.  Thoracic impedance trending along reference line.  Patient denied fluid symptoms.  Education given to limit sodium intake to < 2000 mg and fluid intake to 64 oz daily.  Encouraged to call for any fluid symptoms.  No changes today.     Rosalene Billings, RN, CCM 05/16/2015 7:59 AM

## 2015-05-26 ENCOUNTER — Other Ambulatory Visit: Payer: Self-pay

## 2015-05-26 ENCOUNTER — Ambulatory Visit (HOSPITAL_COMMUNITY): Payer: BC Managed Care – PPO | Attending: Cardiology

## 2015-05-26 DIAGNOSIS — R29898 Other symptoms and signs involving the musculoskeletal system: Secondary | ICD-10-CM | POA: Diagnosis not present

## 2015-05-26 DIAGNOSIS — I110 Hypertensive heart disease with heart failure: Secondary | ICD-10-CM | POA: Diagnosis not present

## 2015-05-26 DIAGNOSIS — I5022 Chronic systolic (congestive) heart failure: Secondary | ICD-10-CM | POA: Diagnosis not present

## 2015-05-26 DIAGNOSIS — I77810 Thoracic aortic ectasia: Secondary | ICD-10-CM | POA: Diagnosis not present

## 2015-05-26 DIAGNOSIS — I351 Nonrheumatic aortic (valve) insufficiency: Secondary | ICD-10-CM | POA: Diagnosis not present

## 2015-05-26 DIAGNOSIS — E7800 Pure hypercholesterolemia, unspecified: Secondary | ICD-10-CM | POA: Insufficient documentation

## 2015-05-26 DIAGNOSIS — I429 Cardiomyopathy, unspecified: Secondary | ICD-10-CM | POA: Insufficient documentation

## 2015-05-26 DIAGNOSIS — I509 Heart failure, unspecified: Secondary | ICD-10-CM | POA: Diagnosis present

## 2015-06-07 ENCOUNTER — Telehealth: Payer: Self-pay | Admitting: *Deleted

## 2015-06-07 ENCOUNTER — Other Ambulatory Visit: Payer: Self-pay | Admitting: Internal Medicine

## 2015-06-07 NOTE — Telephone Encounter (Signed)
Patient requesting to speak with Dr. Gerlean Ren, states he stopped taking the gabapentin because it was not helping but the shingles have not gone away completely.

## 2015-06-16 ENCOUNTER — Ambulatory Visit (INDEPENDENT_AMBULATORY_CARE_PROVIDER_SITE_OTHER): Payer: BC Managed Care – PPO

## 2015-06-16 DIAGNOSIS — I5022 Chronic systolic (congestive) heart failure: Secondary | ICD-10-CM | POA: Diagnosis not present

## 2015-06-16 DIAGNOSIS — Z95810 Presence of automatic (implantable) cardiac defibrillator: Secondary | ICD-10-CM | POA: Diagnosis not present

## 2015-06-16 NOTE — Progress Notes (Signed)
EPIC Encounter for ICM Monitoring  Patient Name: Philip Chavez is a 65 y.o. male Date: 06/16/2015 Primary Care Physican: Junie Panning, DO Primary Cardiologist: Aundra Dubin Electrophysiologist: Lovena Le Dry Weight: 200 lbs   Bi-V Pacing 98.2%      In the past month, have you:  1. Gained more than 2 pounds in a day or more than 5 pounds in a week? no  2. Had changes in your medications (with verification of current medications)? no  3. Had more shortness of breath than is usual for you? no  4. Limited your activity because of shortness of breath? no  5. Not been able to sleep because of shortness of breath? no  6. Had increased swelling in your feet, ankles, legs or stomach area? no  7. Had symptoms of dehydration (dizziness, dry mouth, increased thirst, decreased urine output) no  8. Had changes in sodium restriction? no  9. Been compliant with medication? Yes  ICM trend: 3 month view for 06/16/2015    ICM trend: 1 year view for 06/16/2015   Follow-up plan: ICM clinic phone appointment 07/17/2015.    FLUID LEVELS: Optivol thoracic impedance decreased 06/07/2015 to 06/16/2015 suggesting fluid accumulation and almost back at baseline today.    SYMPTOMS:  None.  Denied any symptoms such as weight gain of 3 pounds overnight or 5 pounds within a week, SOB and/or lower extremity swelling.  Encouraged to call for any fluid symptoms.   EDUCATION: Limit sodium intake to < 2000 mg and fluid intake to 64 oz daily.   No changes today.    Rosalene Billings, RN, CCM 06/16/2015 8:05 AM

## 2015-06-19 ENCOUNTER — Telehealth (HOSPITAL_COMMUNITY): Payer: Self-pay

## 2015-06-19 NOTE — Telephone Encounter (Signed)
Echo results reviewed with patient, no questions or concerns at this time, apt confirmed for next Monday with CHF clinic.  Renee Pain

## 2015-06-26 ENCOUNTER — Other Ambulatory Visit (HOSPITAL_COMMUNITY): Payer: Self-pay | Admitting: *Deleted

## 2015-06-26 ENCOUNTER — Encounter (HOSPITAL_COMMUNITY): Payer: Self-pay

## 2015-06-26 ENCOUNTER — Ambulatory Visit (HOSPITAL_COMMUNITY)
Admission: RE | Admit: 2015-06-26 | Discharge: 2015-06-26 | Disposition: A | Discharge status: Home / Self Care | Payer: BC Managed Care – PPO | Source: Ambulatory Visit | Attending: Cardiology | Admitting: Cardiology

## 2015-06-26 VITALS — BP 140/92 | HR 81 | Wt 209.8 lb

## 2015-06-26 DIAGNOSIS — Z79899 Other long term (current) drug therapy: Secondary | ICD-10-CM | POA: Insufficient documentation

## 2015-06-26 DIAGNOSIS — I4892 Unspecified atrial flutter: Secondary | ICD-10-CM | POA: Diagnosis not present

## 2015-06-26 DIAGNOSIS — I2510 Atherosclerotic heart disease of native coronary artery without angina pectoris: Secondary | ICD-10-CM | POA: Diagnosis not present

## 2015-06-26 DIAGNOSIS — Z86718 Personal history of other venous thrombosis and embolism: Secondary | ICD-10-CM | POA: Insufficient documentation

## 2015-06-26 DIAGNOSIS — I428 Other cardiomyopathies: Secondary | ICD-10-CM | POA: Insufficient documentation

## 2015-06-26 DIAGNOSIS — Z8249 Family history of ischemic heart disease and other diseases of the circulatory system: Secondary | ICD-10-CM | POA: Insufficient documentation

## 2015-06-26 DIAGNOSIS — I5022 Chronic systolic (congestive) heart failure: Secondary | ICD-10-CM | POA: Diagnosis not present

## 2015-06-26 DIAGNOSIS — E785 Hyperlipidemia, unspecified: Secondary | ICD-10-CM | POA: Insufficient documentation

## 2015-06-26 DIAGNOSIS — Z95810 Presence of automatic (implantable) cardiac defibrillator: Secondary | ICD-10-CM | POA: Diagnosis not present

## 2015-06-26 DIAGNOSIS — Z7982 Long term (current) use of aspirin: Secondary | ICD-10-CM | POA: Diagnosis not present

## 2015-06-26 DIAGNOSIS — Z86711 Personal history of pulmonary embolism: Secondary | ICD-10-CM | POA: Insufficient documentation

## 2015-06-26 DIAGNOSIS — G4733 Obstructive sleep apnea (adult) (pediatric): Secondary | ICD-10-CM | POA: Insufficient documentation

## 2015-06-26 DIAGNOSIS — N189 Chronic kidney disease, unspecified: Secondary | ICD-10-CM | POA: Diagnosis not present

## 2015-06-26 DIAGNOSIS — I442 Atrioventricular block, complete: Secondary | ICD-10-CM | POA: Diagnosis not present

## 2015-06-26 LAB — LIPID PANEL
CHOL/HDL RATIO: 3.2 ratio
CHOLESTEROL: 152 mg/dL (ref 0–200)
HDL: 48 mg/dL (ref >40)
LDL Cholesterol: 72 mg/dL (ref 0–99)
TRIGLYCERIDES: 159 mg/dL — AB (ref <150)
VLDL: 32 mg/dL (ref 0–40)

## 2015-06-26 LAB — BASIC METABOLIC PANEL
ANION GAP: 6 (ref 5–15)
BUN: 13 mg/dL (ref 6–20)
CHLORIDE: 106 mmol/L (ref 101–111)
CO2: 26 mmol/L (ref 22–32)
Calcium: 9.4 mg/dL (ref 8.9–10.3)
Creatinine, Ser: 1.2 mg/dL (ref 0.61–1.24)
GFR calc non Af Amer: >60 mL/min (ref >60)
Glucose, Bld: 106 mg/dL — ABNORMAL HIGH (ref 65–99)
POTASSIUM: 3.8 mmol/L (ref 3.5–5.1)
Sodium: 138 mmol/L (ref 135–145)

## 2015-06-26 LAB — DIGOXIN LEVEL: Digoxin Level: 0.5 ng/mL — ABNORMAL LOW (ref 0.8–2.0)

## 2015-06-26 LAB — BRAIN NATRIURETIC PEPTIDE: B Natriuretic Peptide: 127.3 pg/mL — ABNORMAL HIGH (ref 0.0–100.0)

## 2015-06-26 MED ORDER — SACUBITRIL-VALSARTAN 24-26 MG PO TABS: 1.0000 | ORAL_TABLET | Freq: Two times a day (BID) | ORAL | Status: DC

## 2015-06-26 MED ORDER — ISOSORBIDE MONONITRATE ER 30 MG PO TB24: 30.0000 mg | ORAL_TABLET | Freq: Every day | ORAL | Status: DC

## 2015-06-26 NOTE — Progress Notes (Signed)
Advanced Heart Failure Medication Review by a Pharmacist  Does the patient  feel that his/her medications are working for him/her?  yes  Has the patient been experiencing any side effects to the medications prescribed?  no  Does the patient measure his/her own blood pressure or blood glucose at home?  no   Does the patient have any problems obtaining medications due to transportation or finances?   no  Understanding of regimen: good Understanding of indications: good Potential of compliance: good Patient understands to avoid NSAIDs. Patient understands to avoid decongestants.  Issues to address at subsequent visits: None   Pharmacist comments:  [name] is a pleasant 65 yo M presenting with his medication bottles. He reports good compliance with his regimen and states that he takes his medications as directed on his bottles. He did admit that he has had a drug issue in the past which makes him nervous to use medications for pain but we did talk about the safe use of APAP if needed as he is getting over shingles. We did also discuss in detail the basic mechanisms and side effects of each of his HF medications and he verbalized understanding. He did not have any other medication-related questions or concerns for me at this time.   Philip Chavez. Philip Chavez, PharmD, BCPS, CPP Clinical Pharmacist Pager: [phone] Phone: [phone] 06/26/2015 11:13 AM      Time with patient: 12 minutes Preparation and documentation time: 2 minutes Total time: 14 minutes

## 2015-06-26 NOTE — Progress Notes (Signed)
Patient ID: Philip Chavez, male   DOB: 12-05-1950, 65 y.o.   MRN: [id number] EP: Dr. Lovena Le HF cardiologist: Dr Aundra Dubin  65 yo with history of complete heart block/Medtronic PPM, cardiomyopathy, and atrial flutter s/p ablation presents for CHF clinic evaluation.  Patient developed complete heart block in 2/15 and had PPM placed at that time.  He paces his RV continuously.  In 2/15, EF was 50-55% by echo.  By 6/15, EF had fallen to 20-25%.  Cardiolite showed possible scar but no ischemia.  On 09/03/14, he was admitted with dyspnea and found to be in atrial flutter with RLL PNA and with volume overload.  Echo showed EF 15% with diffuse hypokinesis.  He was started on IV Lasix and IV amiodarone. He converted back to NSR.  He subsequently had atrial flutter ablation.  He had right and left heart cath in 9/16, showing nonobstructive CAD and optimized filling pressures with CI 2.09.  He subsequently had upgrade to CRT by Dr Lovena Le.   He is doing well today.  No exertional dyspnea or chest pain.  He does not get lightheaded.  No palpitations.  He can walk up steps without problems. He was able to cut down bushes in his yard yesterday with no problems.  No orthopnea/PND.  Weight is down 2 lbs.  Main problem has been left shoulder/upper chest burning from post-herpetic neuralgia, this is ongoing.  Gabapentin did not help.     Optivol: No VT, no atrial fibrillation, fluid index < threshold, thoracic impedance looked ok.   Labs (8/16): K 3.9, creatinine 1.03, AST 106, ALT 173, HCT 45.1, LDL 132 Labs (9/16): K 3.9, creatinine 1.16, HCT 42.5 Labs (10/16): K 3.9, creatinine 1.32, HCT 39.9 Labs (2/17): K 4.1, creatinine 1.26, BNP 107, digoxin 0.3  PMH: 1. H/o complete heart block: Has Medtronic PPM, placed in 2/15.   2. Cardiomyopathy: Echo (2/15) prior to PPM placement with EF 50-55%.  Echo (6/15) with EF 20-25%,  Cardiolite at that time showed possible scar but no ischemia.  Echo (8/16) with EF 15%, diffuse  hypokinesis, mildly dilated RV with normal systolic function, PA systolic pressure 69 mmHg.  LHC/RHC (9/16) with mean RA 4, PA 31/15 mean 20, mean PCWP 8, CI 2.09, 40% mLAD stenosis.  Upgrade to MDT CRT-D in 9/16.  - Echo (4/17) with EF 30-35%.   3. CKD 4. 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  5. Atrial tachycardia: paroxysmal. 6. Atrial flutter: s/p ablation in 8/16.  7. OSA 8. Hyperlipidemia 9. Shingles with post-herpetic neuralgia.   FH: Father with CHF, complete heart block with PPM.  Sister with complete heart block and PPM.   SH: Retired Curator, nonsmoker, lives in [place]: All systems reviewed and negative except as per HPI  Current Outpatient Prescriptions  Medication Sig Dispense Refill  . acetaminophen (TYLENOL) 325 MG tablet Take 1-2 tablets (325-650 mg total) by mouth every 4 (four) hours as needed for mild pain.    Marland Kitchen aspirin EC 81 MG tablet Take 1 tablet (81 mg total) by mouth daily. 30 tablet 3  . carvedilol (COREG) 6.25 MG tablet Take 1 tablet (6.25 mg total) by mouth 2 (two) times daily with a meal. 60 tablet 3  . digoxin (LANOXIN) 0.125 MG tablet Take 1 tablet (125 mcg total) by mouth daily. 90 tablet 2  . furosemide (LASIX) 20 MG tablet Take 1 tablet (20 mg total) by mouth 2 (two) times daily. 180 tablet 3  . hydrALAZINE (APRESOLINE) 10  MG tablet Take 2 tablets (20 mg total) by mouth 3 (three) times daily. 180 tablet 6  . ivabradine (CORLANOR) 5 MG TABS tablet Take 1 tablet (5 mg total) by mouth 2 (two) times daily with a meal. 120 tablet 11  . simvastatin (ZOCOR) 40 MG tablet Take 1 tablet (40 mg total) by mouth daily. 90 tablet 3  . spironolactone (ALDACTONE) 25 MG tablet Take 1 tablet (25 mg total) by mouth daily. 30 tablet 11  . isosorbide mononitrate (IMDUR) 30 MG 24 hr tablet Take 1 tablet (30 mg total) by mouth daily. 90 tablet 3  . sacubitril-valsartan (ENTRESTO) 24-26 MG Take 1 tablet by mouth 2 (two) times daily. 60 tablet 3   No current  facility-administered medications for this encounter.   BP 140/92 mmHg  Pulse 81  Wt 209 lb 12 oz (95.142 kg)  SpO2 98% General: NAD Neck: No JVD, no thyromegaly or thyroid nodule.  Lungs: Clear to auscultation bilaterally with normal respiratory effort. CV: Nondisplaced PMI.  Heart regular S1/S2, no S3/S4, no murmur.  No peripheral edema.  No carotid bruit.  Normal pedal pulses.  Abdomen: Soft, nontender, no hepatosplenomegaly, no distention.  Skin: Intact without lesions or rashes.  Neurologic: Alert and oriented x 3.  Psych: Normal affect. Extremities: No clubbing or cyanosis.  HEENT: Normal.   Assessment/Plan: 1. Chronic systolic CHF: Nonischemic cardiomyopathy.  EF 15% on echo in 8/16.  Patient had borderline reduced EF when PPM was placed in 2/15.  After that, EF fell significantly.  Patient has complete heart block.  Interestingly, father had CHF and CHB with pacemaker, and sounds like sister also had CHB with pacemaker. I am concerned for a genetic dilated cardiomyopathy associated with CHB such as LMNA or SCN5A.  Alternatively, EF could have fallen because of chronic RV pacing.  He has had CRT upgrade.  No significant CAD on last cath.  Echo in 4/17 showed improvement in EF to 30-35%.  On exam today, he is not volume overloaded.  NYHA class II symptoms.  - Continue current digoxin, Coreg, Lasix, hydralazine, Imdur, spironolactone. Check BMET/BNP/digoxin level today.  - Stop lisinopril, in 36 hrs start Entresto 24/26 bid.  BMET again in 2 wks.  - Will need to consider genetic evaluation to look for LMNA or SCN5A mutations that could be used for familial screening.  2. Atrial flutter: s/p ablation. He is on ASA 81 daily.  If he has atrial fibrillation or recurrent flutter in the future, will need to go back on Xarelto.  3. PE/DVT: >1 year ago and associated with surgery.  4. Hyperlipidemia: He has mild CAD.  Continue ASA 81 and statin.   Followup in 2 months.   Loralie Champagne 06/26/2015

## 2015-06-26 NOTE — Patient Instructions (Signed)
Stop Lisinopril  Start Entresto 24/26 mg Twice daily STARTING ON WED 5/24 PM  Labs today  Labs in 2 weeks  Your physician recommends that you schedule a follow-up appointment in: 2 months

## 2015-06-27 ENCOUNTER — Other Ambulatory Visit: Payer: Self-pay

## 2015-06-28 MED ORDER — CARVEDILOL 6.25 MG PO TABS: 6.2500 mg | ORAL_TABLET | Freq: Two times a day (BID) | ORAL | Status: DC

## 2015-07-05 ENCOUNTER — Other Ambulatory Visit (HOSPITAL_COMMUNITY): Payer: Self-pay | Admitting: *Deleted

## 2015-07-05 MED ORDER — ISOSORBIDE MONONITRATE ER 30 MG PO TB24: 30.0000 mg | ORAL_TABLET | Freq: Every day | ORAL | Status: DC

## 2015-07-05 MED ORDER — CARVEDILOL 6.25 MG PO TABS: 6.2500 mg | ORAL_TABLET | Freq: Two times a day (BID) | ORAL | Status: DC

## 2015-07-10 ENCOUNTER — Ambulatory Visit (HOSPITAL_COMMUNITY)
Admission: RE | Admit: 2015-07-10 | Discharge: 2015-07-10 | Disposition: A | Discharge status: Home / Self Care | Payer: BC Managed Care – PPO | Source: Ambulatory Visit | Attending: Internal Medicine | Admitting: Internal Medicine

## 2015-07-10 DIAGNOSIS — I5022 Chronic systolic (congestive) heart failure: Secondary | ICD-10-CM

## 2015-07-10 DIAGNOSIS — I5023 Acute on chronic systolic (congestive) heart failure: Secondary | ICD-10-CM | POA: Diagnosis not present

## 2015-07-10 LAB — BASIC METABOLIC PANEL
Anion gap: 6 (ref 5–15)
BUN: 11 mg/dL (ref 6–20)
CHLORIDE: 105 mmol/L (ref 101–111)
CO2: 26 mmol/L (ref 22–32)
Calcium: 9.4 mg/dL (ref 8.9–10.3)
Creatinine, Ser: 1.23 mg/dL (ref 0.61–1.24)
GFR calc non Af Amer: >60 mL/min (ref >60)
Glucose, Bld: 109 mg/dL — ABNORMAL HIGH (ref 65–99)
POTASSIUM: 3.9 mmol/L (ref 3.5–5.1)
SODIUM: 137 mmol/L (ref 135–145)

## 2015-07-12 ENCOUNTER — Encounter: Payer: Self-pay | Admitting: Cardiology

## 2015-07-14 ENCOUNTER — Other Ambulatory Visit: Payer: Self-pay | Admitting: Cardiology

## 2015-07-17 ENCOUNTER — Ambulatory Visit (INDEPENDENT_AMBULATORY_CARE_PROVIDER_SITE_OTHER): Payer: BC Managed Care – PPO | Admitting: *Deleted

## 2015-07-17 ENCOUNTER — Telehealth: Payer: Self-pay | Admitting: Cardiology

## 2015-07-17 DIAGNOSIS — I428 Other cardiomyopathies: Secondary | ICD-10-CM

## 2015-07-17 DIAGNOSIS — I429 Cardiomyopathy, unspecified: Secondary | ICD-10-CM | POA: Diagnosis not present

## 2015-07-17 DIAGNOSIS — Z95810 Presence of automatic (implantable) cardiac defibrillator: Secondary | ICD-10-CM | POA: Diagnosis not present

## 2015-07-17 NOTE — Progress Notes (Signed)
Remote ICD transmission.   

## 2015-07-17 NOTE — Telephone Encounter (Signed)
LMOVM reminding pt to send remote transmission.   

## 2015-07-17 NOTE — Progress Notes (Signed)
EPIC Encounter for ICM Monitoring  Patient Name: Philip Chavez is a 65 y.o. male Date: 07/17/2015 Primary Care Physican: Junie Panning, DO Primary Cardiologist: Aundra Dubin Electrophysiologist: Lovena Le Dry Weight: 200 lbs   Bi-V Pacing 99.1%      In the past month, have you:  1. Gained more than 2 pounds in a day or more than 5 pounds in a week? no  2. Had changes in your medications (with verification of current medications)? no  3. Had more shortness of breath than is usual for you? no  4. Limited your activity because of shortness of breath? no  5. Not been able to sleep because of shortness of breath? no  6. Had increased swelling in your feet, ankles, legs or stomach area? no  7. Had symptoms of dehydration (dizziness, dry mouth, increased thirst, decreased urine output) no  8. Had changes in sodium restriction? no  9. Been compliant with medication? Yes  ICM trend: 3 month view for 07/17/2015   ICM trend: 1 year view for 07/17/2015   Follow-up plan: ICM clinic phone appointment 08/17/2015.    FLUID LEVELS: Optivol thoracic impedance decreased 07/06/2015 to 07/10/2015 suggesting fluid accumulation and returned to baseline 07/10/2015 suggesting stable fluid levels.    SYMPTOMS: None.  Denied any symptoms such as weight gain of 3 pounds overnight or 5 pounds within a week, SOB and/or lower extremity swelling. Encouraged to call for any fluid symptoms.   EDUCATION: Limit sodium intake to < 2000 mg and fluid intake to 64 oz daily.     RECOMMENDATIONS: No changes today.      Rosalene Billings, RN, CCM 07/17/2015 3:03 PM

## 2015-07-20 LAB — CUP PACEART REMOTE DEVICE CHECK
Battery Remaining Longevity: 65 mo
Battery Voltage: 2.98 V
Brady Statistic AP VP Percent: 8.38 %
Brady Statistic AS VP Percent: 90.62 %
Brady Statistic RA Percent Paced: 8.39 %
Brady Statistic RV Percent Paced: 98.12 %
Date Time Interrogation Session: 20170612155257
HIGH POWER IMPEDANCE MEASURED VALUE: 57 Ohm
Implantable Lead Implant Date: 20150209
Implantable Lead Implant Date: 20160919
Implantable Lead Location: 753858
Implantable Lead Location: 753859
Implantable Lead Model: 4598
Implantable Lead Model: 5076
Lead Channel Impedance Value: 342 Ohm
Lead Channel Impedance Value: 342 Ohm
Lead Channel Impedance Value: 342 Ohm
Lead Channel Impedance Value: 399 Ohm
Lead Channel Impedance Value: 399 Ohm
Lead Channel Impedance Value: 437 Ohm
Lead Channel Impedance Value: 456 Ohm
Lead Channel Impedance Value: 627 Ohm
Lead Channel Pacing Threshold Amplitude: 0.875 V
Lead Channel Pacing Threshold Pulse Width: 0.4 ms
Lead Channel Sensing Intrinsic Amplitude: 15.25 mV
Lead Channel Sensing Intrinsic Amplitude: 2.25 mV
Lead Channel Sensing Intrinsic Amplitude: 2.25 mV
Lead Channel Setting Pacing Amplitude: 2 V
Lead Channel Setting Pacing Amplitude: 3 V
Lead Channel Setting Pacing Pulse Width: 0.4 ms
MDC IDC LEAD IMPLANT DT: 20160919
MDC IDC LEAD LOCATION: 753860
MDC IDC LEAD MODEL: 6935
MDC IDC MSMT LEADCHNL LV IMPEDANCE VALUE: 323 Ohm
MDC IDC MSMT LEADCHNL LV IMPEDANCE VALUE: 570 Ohm
MDC IDC MSMT LEADCHNL LV IMPEDANCE VALUE: 570 Ohm
MDC IDC MSMT LEADCHNL LV IMPEDANCE VALUE: 627 Ohm
MDC IDC MSMT LEADCHNL LV IMPEDANCE VALUE: 627 Ohm
MDC IDC MSMT LEADCHNL LV PACING THRESHOLD AMPLITUDE: 1.75 V
MDC IDC MSMT LEADCHNL LV PACING THRESHOLD PULSEWIDTH: 0.4 ms
MDC IDC MSMT LEADCHNL RA PACING THRESHOLD AMPLITUDE: 0.875 V
MDC IDC MSMT LEADCHNL RA PACING THRESHOLD PULSEWIDTH: 0.4 ms
MDC IDC MSMT LEADCHNL RV SENSING INTR AMPL: 15.25 mV
MDC IDC SET LEADCHNL LV PACING PULSEWIDTH: 0.4 ms
MDC IDC SET LEADCHNL RA PACING AMPLITUDE: 1.75 V
MDC IDC SET LEADCHNL RV SENSING SENSITIVITY: 0.3 mV
MDC IDC STAT BRADY AP VS PERCENT: 0.01 %
MDC IDC STAT BRADY AS VS PERCENT: 0.98 %

## 2015-07-27 ENCOUNTER — Encounter: Payer: Self-pay | Admitting: Cardiology

## 2015-08-02 ENCOUNTER — Telehealth (HOSPITAL_COMMUNITY): Payer: Self-pay | Admitting: Pharmacist

## 2015-08-02 NOTE — Telephone Encounter (Signed)
Entresto 24-26 mg PO BID PA approved by [facility] commercial through 07/01/16.   [name], PharmD, BCPS, CPP Clinical Pharmacist Pager: [phone] Phone: [phone] 08/02/2015 8:35 AM

## 2015-08-17 ENCOUNTER — Ambulatory Visit (INDEPENDENT_AMBULATORY_CARE_PROVIDER_SITE_OTHER): Payer: BC Managed Care – PPO | Admitting: Family Medicine

## 2015-08-17 ENCOUNTER — Ambulatory Visit (INDEPENDENT_AMBULATORY_CARE_PROVIDER_SITE_OTHER): Payer: BC Managed Care – PPO

## 2015-08-17 ENCOUNTER — Encounter: Payer: Self-pay | Admitting: Family Medicine

## 2015-08-17 VITALS — BP 94/58 | HR 87 | Temp 98.1°F | Ht 73.0 in | Wt 213.6 lb

## 2015-08-17 DIAGNOSIS — I5022 Chronic systolic (congestive) heart failure: Secondary | ICD-10-CM | POA: Diagnosis not present

## 2015-08-17 DIAGNOSIS — B0229 Other postherpetic nervous system involvement: Secondary | ICD-10-CM

## 2015-08-17 DIAGNOSIS — Z95810 Presence of automatic (implantable) cardiac defibrillator: Secondary | ICD-10-CM | POA: Diagnosis not present

## 2015-08-17 NOTE — Progress Notes (Signed)
Subjective:     Patient ID: Philip Chavez, male   DOB: 1950-03-06, 65 y.o.   MRN: [id number]  HPI Philip Chavez is a 65yo male presenting today for follow up of post-herpetic neuralgia. - Reports significant improvement since last office visit. - No longer has pain over shoulder and neck where shingles occurred. Does report some mild itching, improved with OTC itch cream - Does note some nerve pain along left jaw, improved with oral chloraseptic spray - Does not follow with dentist - Chews on left side of mouth - Reports it is in previous distribution of rash, feels consistent with prior nerve pain - Improving. Self discontinued Gabapentin since his pain had improved. - Former Smoker  Review of Systems Per HPI    Objective:   Physical Exam  Constitutional: He appears well-developed and well-nourished. No distress.  HENT:  Missing teeth noted on left mandible, but no active caries noted. No popping noted over TMJ bilaterally. No tenderness to palpation along left jaw. No abscess or adenopathy palpable.   Cardiovascular: Normal rate and regular rhythm.   No murmur heard. Pulmonary/Chest: Effort normal. He has no wheezes.  Skin:  Scarring with excoriations from previous episode of shingles along left shoulder and neck.      Assessment and Plan:     Post herpetic neuralgia - Improved - Continue OTC treatments - Reports left mouth pain consistent with prior nerve pain, but recommend follow up with dentist to rule out dental etiology of pain given location - Follow up if needed

## 2015-08-17 NOTE — Patient Instructions (Addendum)
Thank you so much for coming to visit today! I'm glad your pain is doing better! Please keep using over the counter medications as needed. Please schedule an appointment with the Dentist to make sure that is not the cause of your pain. Please return as needed.  Thanks again! Dr. Gerlean Ren

## 2015-08-17 NOTE — Assessment & Plan Note (Signed)
-   Improved - Continue OTC treatments - Reports left mouth pain consistent with prior nerve pain, but recommend follow up with dentist to rule out dental etiology of pain given location - Follow up if needed

## 2015-08-18 NOTE — Progress Notes (Signed)
EPIC Encounter for ICM Monitoring  Patient Name: Philip Chavez is a 65 y.o. male Date: 08/18/2015 Primary Care Physican: Junie Panning, DO Primary Cardiologist: Aundra Dubin Electrophysiologist: Lovena Le Dry Weight: 211 lb  Bi-V Pacing:  98.6%       Heart Failure questions reviewed, pt asymptomatic   Thoracic impedence stable.  Recommendations: No changes.  Low sodium diet education provided   ICM trend: 08/17/2015    Follow-up plan: ICM clinic phone appointment on 09/18/2015.  Copy of ICM check sent to device physician.   Rosalene Billings, RN 08/18/2015 8:06 AM

## 2015-08-29 ENCOUNTER — Ambulatory Visit (HOSPITAL_COMMUNITY)
Admission: RE | Admit: 2015-08-29 | Discharge: 2015-08-29 | Disposition: A | Discharge status: Home / Self Care | Payer: BC Managed Care – PPO | Source: Ambulatory Visit | Attending: Cardiology | Admitting: Cardiology

## 2015-08-29 VITALS — BP 112/64 | HR 47 | Wt 215.8 lb

## 2015-08-29 DIAGNOSIS — Z86711 Personal history of pulmonary embolism: Secondary | ICD-10-CM | POA: Diagnosis not present

## 2015-08-29 DIAGNOSIS — I5042 Chronic combined systolic (congestive) and diastolic (congestive) heart failure: Secondary | ICD-10-CM | POA: Diagnosis not present

## 2015-08-29 DIAGNOSIS — I4891 Unspecified atrial fibrillation: Secondary | ICD-10-CM | POA: Diagnosis not present

## 2015-08-29 DIAGNOSIS — Z950 Presence of cardiac pacemaker: Secondary | ICD-10-CM | POA: Insufficient documentation

## 2015-08-29 DIAGNOSIS — I429 Cardiomyopathy, unspecified: Secondary | ICD-10-CM

## 2015-08-29 DIAGNOSIS — I428 Other cardiomyopathies: Secondary | ICD-10-CM

## 2015-08-29 NOTE — Progress Notes (Signed)
Patient ID: Philip Chavez, male   DOB: 1950-12-21, 65 y.o.   MRN: [id number] EP: Dr. Lovena Le HF cardiologist: Dr Aundra Dubin  65 yo with history of complete heart block/Medtronic PPM, cardiomyopathy, and atrial flutter s/p ablation presents for CHF clinic evaluation.  Patient developed complete heart block in 2/15 and had PPM placed at that time.  He paces his RV continuously.  In 2/15, EF was 50-55% by echo.  By 6/15, EF had fallen to 20-25%.  Cardiolite showed possible scar but no ischemia.  On 09/03/14, he was admitted with dyspnea and found to be in atrial flutter with RLL PNA and with volume overload.  Echo showed EF 15% with diffuse hypokinesis.  He was started on IV Lasix and IV amiodarone. He converted back to NSR.  He subsequently had atrial flutter ablation.  He had right and left heart cath in 9/16, showing nonobstructive CAD and optimized filling pressures with CI 2.09.  He subsequently had upgrade to CRT by Dr Lovena Le.   Today he returns for HF follow up. Last visit lisinopril was stopped and entresto was started. Overall feeling ok. Denies SOB/PND/Orthopnea. Weight at home 207-208 pounds. Able to walk 2-3 miles a day. Appetite improved. Taking all medications. Retired Curator.   Optivol: No VT, a few blips concerning for atrial arrhythmias, fluid index flat. Activity ~2 hours per day.   Labs (8/16): K 3.9, creatinine 1.03, AST 106, ALT 173, HCT 45.1, LDL 132 Labs (9/16): K 3.9, creatinine 1.16, HCT 42.5 Labs (10/16): K 3.9, creatinine 1.32, HCT 39.9 Labs (2/17): K 4.1, creatinine 1.26, BNP 107, digoxin 0.3 Labs (07/10/2015): K 3.9 Creatinine 1.23   PMH: 1. H/o complete heart block: Has Medtronic PPM, placed in 2/15.   2. Cardiomyopathy: Echo (2/15) prior to PPM placement with EF 50-55%.  Echo (6/15) with EF 20-25%,  Cardiolite at that time showed possible scar but no ischemia.  Echo (8/16) with EF 15%, diffuse hypokinesis, mildly dilated RV with normal systolic function, PA systolic pressure 69  mmHg.  LHC/RHC (9/16) with mean RA 4, PA 31/15 mean 20, mean PCWP 8, CI 2.09, 40% mLAD stenosis.  Upgrade to MDT CRT-D in 9/16.  - Echo (4/17) with EF 30-35%.   3. CKD 4. 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  5. Atrial tachycardia: paroxysmal. 6. Atrial flutter: s/p ablation in 8/16.  7. OSA 8. Hyperlipidemia 9. Shingles with post-herpetic neuralgia.   FH: Father with CHF, complete heart block with PPM.  Sister with complete heart block and PPM.   SH: Retired Curator, nonsmoker, lives in [place]: All systems reviewed and negative except as per HPI  Current Outpatient Prescriptions  Medication Sig Dispense Refill  . acetaminophen (TYLENOL) 325 MG tablet Take 1-2 tablets (325-650 mg total) by mouth every 4 (four) hours as needed for mild pain.    Marland Kitchen aspirin EC 81 MG tablet Take 1 tablet (81 mg total) by mouth daily. 30 tablet 3  . carvedilol (COREG) 6.25 MG tablet Take 1 tablet (6.25 mg total) by mouth 2 (two) times daily with a meal. 60 tablet 3  . digoxin (LANOXIN) 0.125 MG tablet Take 1 tablet (125 mcg total) by mouth daily. 90 tablet 2  . furosemide (LASIX) 20 MG tablet Take 1 tablet (20 mg total) by mouth 2 (two) times daily. 180 tablet 3  . hydrALAZINE (APRESOLINE) 10 MG tablet Take 2 tablets (20 mg total) by mouth 3 (three) times daily. 180 tablet 6  . isosorbide mononitrate (IMDUR) 30  MG 24 hr tablet Take 1 tablet (30 mg total) by mouth daily. 90 tablet 3  . ivabradine (CORLANOR) 5 MG TABS tablet Take 1 tablet (5 mg total) by mouth 2 (two) times daily with a meal. 120 tablet 11  . sacubitril-valsartan (ENTRESTO) 24-26 MG Take 1 tablet by mouth 2 (two) times daily. 60 tablet 3  . simvastatin (ZOCOR) 40 MG tablet Take 1 tablet (40 mg total) by mouth daily. 90 tablet 3  . spironolactone (ALDACTONE) 25 MG tablet TAKE 1 TABLET EVERY DAY 30 tablet 2   No current facility-administered medications for this encounter.    BP 112/64   Pulse (!) 47   Wt 215 lb 12 oz (97.9 kg)    SpO2 98%   BMI 28.46 kg/m  General: NAD. Ambulated in the clinic without difficulty.  Neck: No JVD, no thyromegaly or thyroid nodule.  Lungs: Clear  CV: Nondisplaced PMI.  Heart regular S1/S2, no S3/S4, no murmur.  No peripheral edema.  No carotid bruit.  Normal pedal pulses. L upper chest ICD Abdomen: Soft, nontender, no hepatosplenomegaly, no distention.  Skin: Intact without lesions or rashes.  Neurologic: Alert and oriented x 3.  Psych: Normal affect. Extremities: No clubbing or cyanosis.  HEENT: Normal.   EKG: A-Sensed V Paced 72 bpm   Assessment/Plan: 1. Chronic systolic CHF: Nonischemic cardiomyopathy.  EF 15% on echo in 8/16.  Patient had borderline reduced EF when PPM was placed in 2/15.  After that, EF fell significantly.  He has a history of complete heart block and father had CHF and CHB with pacemaker, and sounds like sister also had CHB with pacemaker. Concern genetic dilated cardiomyopathy associated with CHB such as LMNA or SCN5A. Per Dr Aundra Dubin could because of chronic RV pacing.   He has had CRT upgrade.  No significant CAD on last cath.  Echo in 4/17 showed improvement in EF to 30-35%.  Today I reviewed BMET from 07/10/2015. Stable.  - NYHA I. Volume status stable.  Continue current digoxin, Coreg, Lasix, hydralazine, Imdur, spironolactone and ivabradine.  - Continue  Entresto 24/26 bid.  - Will need to consider genetic evaluation to look for LMNA or SCN5A mutations that could be used for familial screening. He would like to hold off.  2. Atrial flutter: s/p ablation. He is on ASA 81 daily.  On Optivol there were a few blips concerning for atrial arrhythmias. Will ask device clinic to assess. If having recurrent A fib will need to restart  Xarelto.  3. PE/DVT: >1 year ago and associated with surgery.  4. Hyperlipidemia: He has mild CAD.  Continue ASA 81 and statin.   Follow up in 3 months.   Amy Clegg  NP-C  08/29/2015

## 2015-08-29 NOTE — Patient Instructions (Signed)
Follow up in 3months with Dr.McLean  

## 2015-08-30 ENCOUNTER — Telehealth: Payer: Self-pay

## 2015-08-30 NOTE — Telephone Encounter (Signed)
Received message from Philip Chavez at HF clinic that patient needs to have device interrogated because he may be having Afib. Discussed with Philip Chavez, device tech and advised to send remote transmission for review.    Call to patient and advised to send remote transmission to review for Afib.  He stated he will send in the next hour.

## 2015-08-30 NOTE — Telephone Encounter (Signed)
Call to patient and explained his transmission did not show Afib. No changes today.

## 2015-08-30 NOTE — Telephone Encounter (Signed)
Memory Dance, device tech reviewed [facility] remote transmission sent today.  She reported no Afib noted.

## 2015-09-05 ENCOUNTER — Other Ambulatory Visit: Payer: Self-pay | Admitting: Cardiology

## 2015-09-18 ENCOUNTER — Ambulatory Visit (INDEPENDENT_AMBULATORY_CARE_PROVIDER_SITE_OTHER): Payer: BC Managed Care – PPO

## 2015-09-18 DIAGNOSIS — I5042 Chronic combined systolic (congestive) and diastolic (congestive) heart failure: Secondary | ICD-10-CM | POA: Diagnosis not present

## 2015-09-18 DIAGNOSIS — Z95810 Presence of automatic (implantable) cardiac defibrillator: Secondary | ICD-10-CM

## 2015-09-18 NOTE — Progress Notes (Signed)
EPIC Encounter for ICM Monitoring  Patient Name: Philip Chavez is a 65 y.o. male Date: 09/18/2015 Primary Care Physican: Junie Panning, DO Primary Cardiologist: Aundra Dubin Electrophysiologist: Lovena Le Dry Weight: 212 lb  Bi-V Pacing:  98.4%       Heart Failure questions reviewed, pt asymptomatic.  Thoracic impedance normal.  Recommendations: No changes.  Low sodium diet education provided   ICM trend: 09/18/2015     Follow-up plan: ICM clinic phone appointment on 10/19/2015.  Copy of ICM check sent to device physician.   Rosalene Billings, RN 09/18/2015 9:55 AM

## 2015-09-20 ENCOUNTER — Encounter: Payer: Self-pay | Admitting: Internal Medicine

## 2015-10-19 ENCOUNTER — Telehealth: Payer: Self-pay | Admitting: Cardiology

## 2015-10-19 ENCOUNTER — Ambulatory Visit (INDEPENDENT_AMBULATORY_CARE_PROVIDER_SITE_OTHER): Payer: BC Managed Care – PPO | Admitting: *Deleted

## 2015-10-19 DIAGNOSIS — Z95810 Presence of automatic (implantable) cardiac defibrillator: Secondary | ICD-10-CM

## 2015-10-19 DIAGNOSIS — I5022 Chronic systolic (congestive) heart failure: Secondary | ICD-10-CM | POA: Diagnosis not present

## 2015-10-19 DIAGNOSIS — I5042 Chronic combined systolic (congestive) and diastolic (congestive) heart failure: Secondary | ICD-10-CM

## 2015-10-19 DIAGNOSIS — I442 Atrioventricular block, complete: Secondary | ICD-10-CM

## 2015-10-19 NOTE — Telephone Encounter (Signed)
Spoke with pt and reminded pt of remote transmission that is due today. Pt verbalized understanding.   

## 2015-10-20 ENCOUNTER — Encounter: Payer: Self-pay | Admitting: Cardiology

## 2015-10-20 NOTE — Progress Notes (Signed)
Remote ICD transmission.   

## 2015-10-27 NOTE — Progress Notes (Signed)
EPIC Encounter for ICM Monitoring  Patient Name: Zixuan Macklin is a 65 y.o. male Date: 10/27/2015 Primary Care Physican: Junie Panning, DO Primary Cardiologist: Aundra Dubin Electrophysiologist: Lovena Le Dry Weight: 212 lb  Bi-V Pacing:  98.3%       Heart Failure questions reviewed, pt asymptomatic   Thoracic impedance normal.  Recommendations: No changes.  Low sodium diet education provided.    Follow-up plan: ICM clinic phone appointment on 11/27/2015.  Reminded him he is due for appointment with Dr Aundra Dubin in October.   Copy of ICM check sent to device physician.   ICM trend: 10/20/2015            Rosalene Billings, RN 10/27/2015 9:26 AM

## 2015-10-31 ENCOUNTER — Other Ambulatory Visit (HOSPITAL_COMMUNITY): Payer: Self-pay | Admitting: Cardiology

## 2015-10-31 IMAGING — US US SCROTUM
1 series · 14 of 25 positions shown · non-contrast
Comparison: None.

CLINICAL DATA: Right testicular pain, status post inguinal hernia
repair

EXAM:
SCROTAL ULTRASOUND
DOPPLER ULTRASOUND OF THE TESTICLES
TECHNIQUE: Complete ultrasound examination of the testicles, epididymis, and
other scrotal structures was performed. Color and spectral Doppler
ultrasound were also utilized to evaluate blood flow to the
testicles.

[Series 1: us scrotum · 0.06mm/px · 14 of 66 slices shown]
[im 1/66]
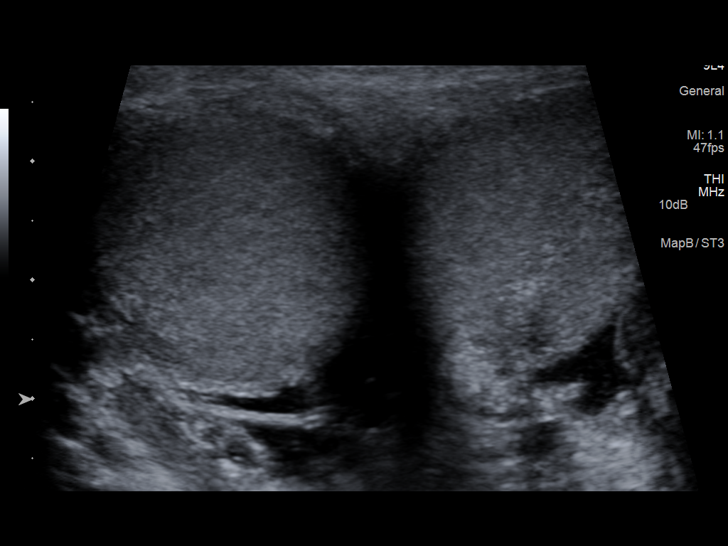
[im 6/66]
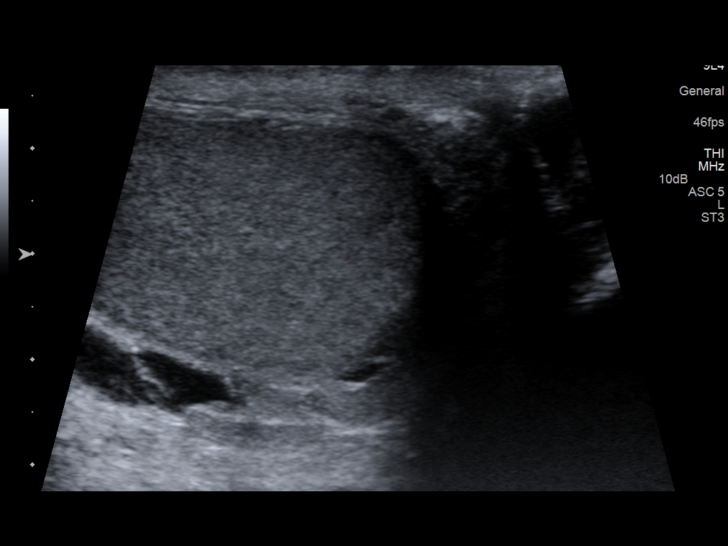
[im 11/66]
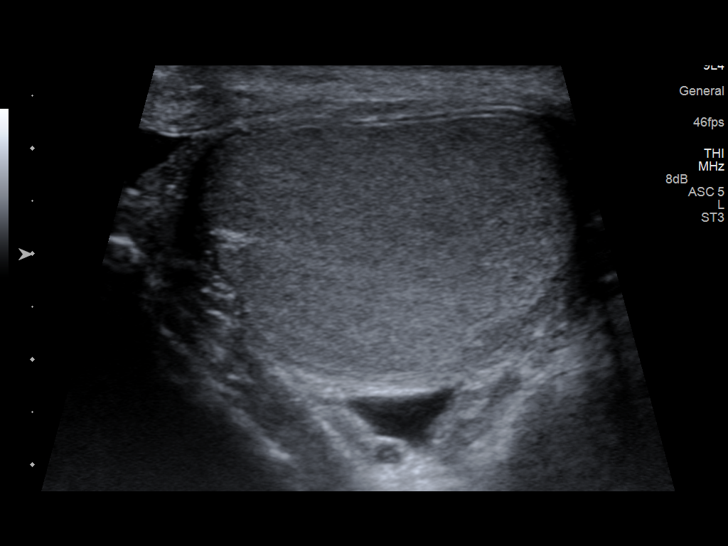
[im 17/66]
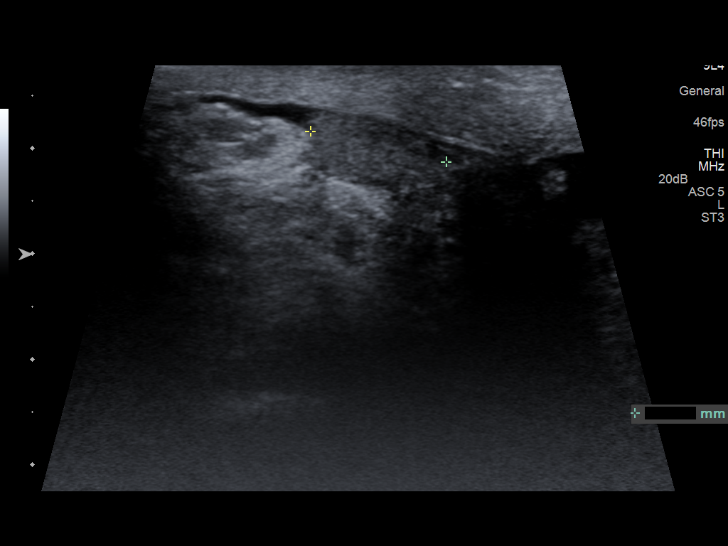
[im 22/66]
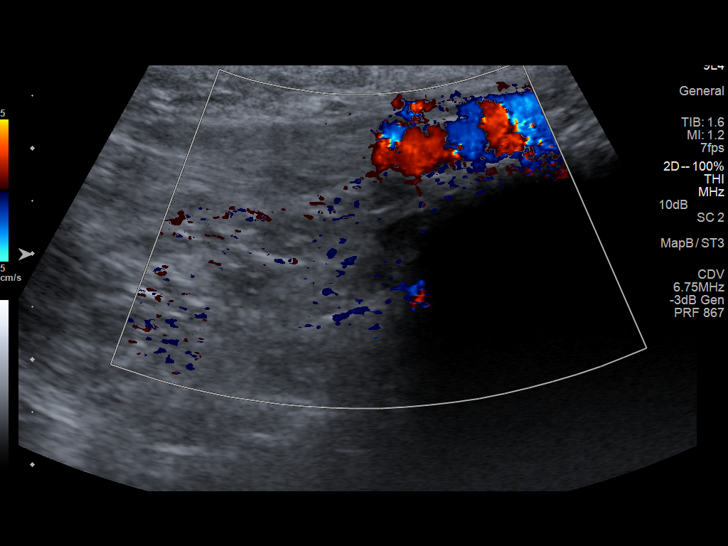
[im 25/66]
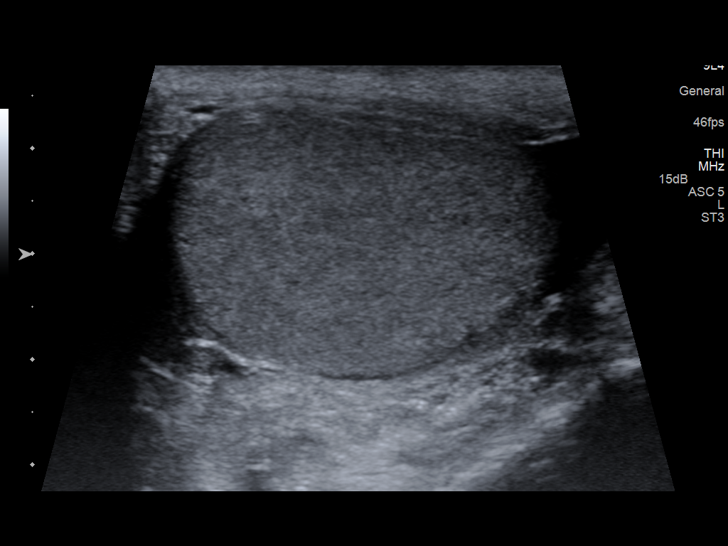
[im 30/66]
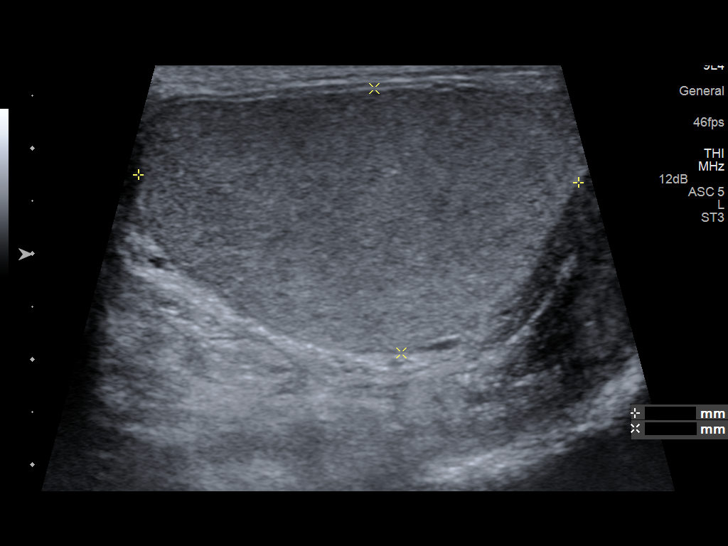
[im 36/66]
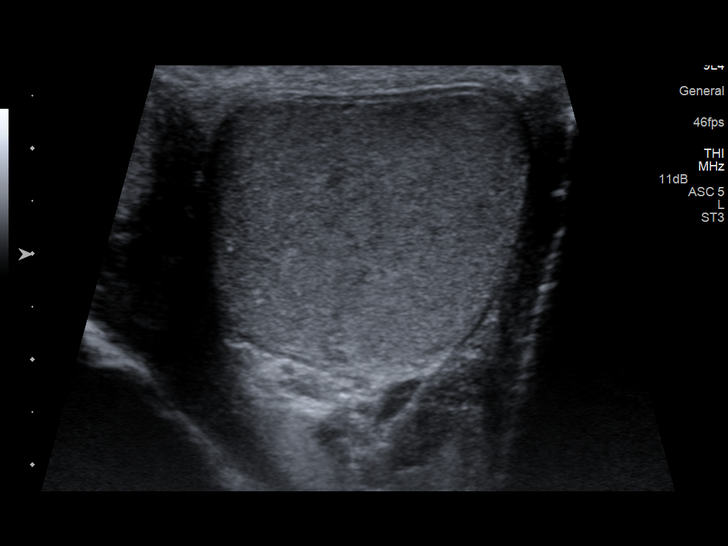
[im 41/66]
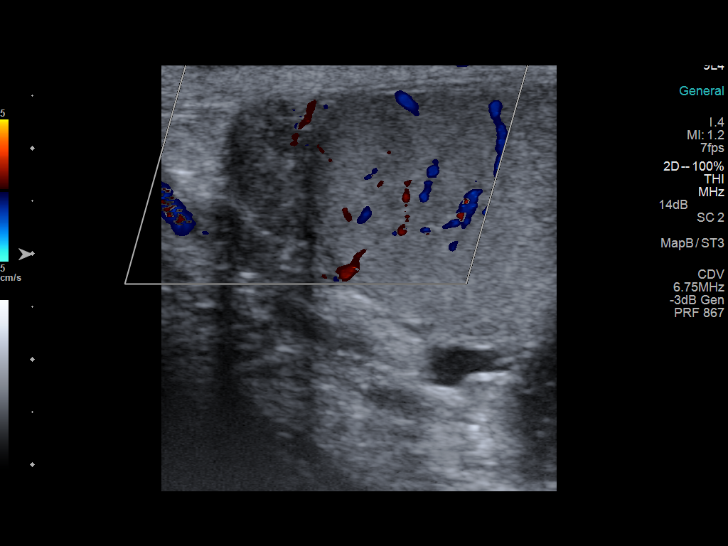
[im 44/66]
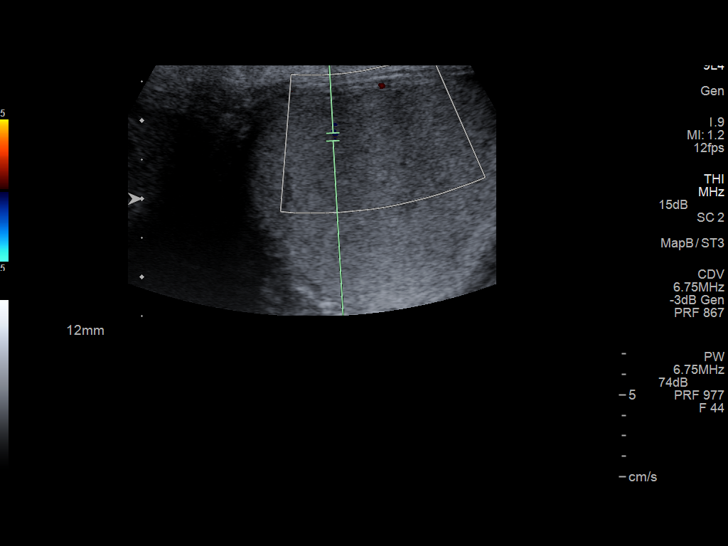
[im 49/66]
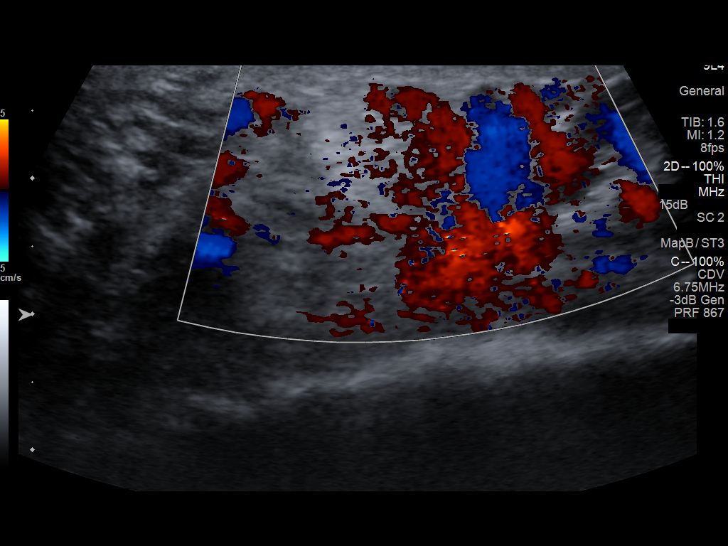
[im 55/66]
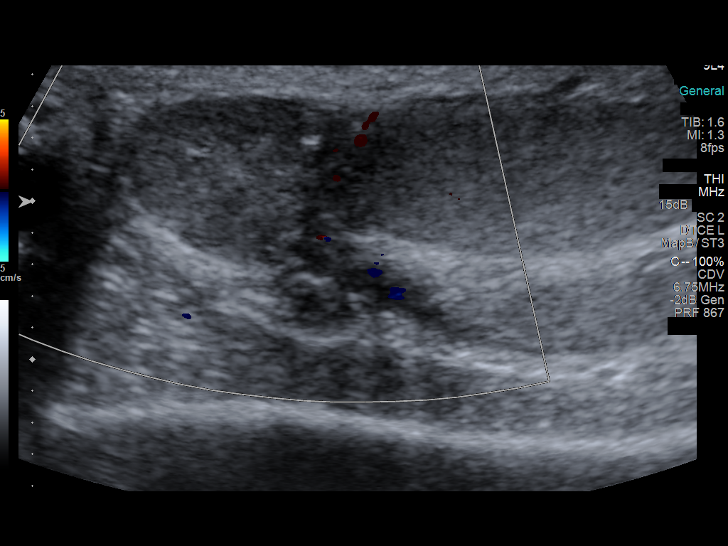
[im 60/66]
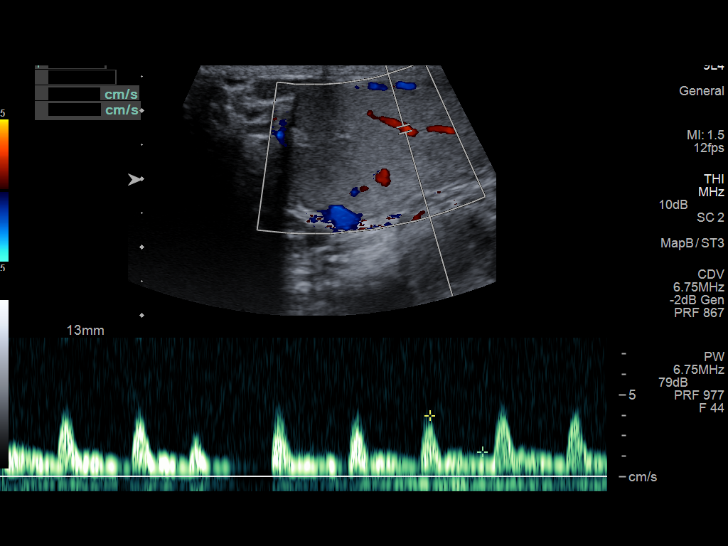
[im 66/66]
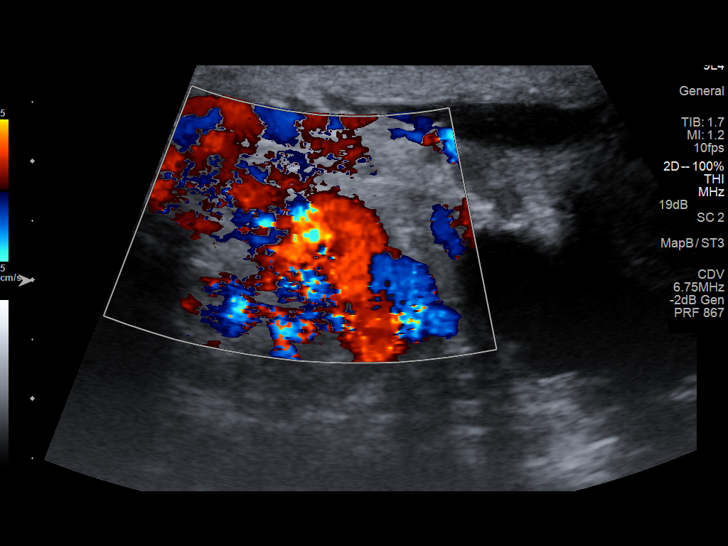

[14 of 25 positions shown; findings below may reference images not displayed]

FINDINGS: Right testicle

Measurements: 4.2 x 2.7 x 3.2 cm. No mass or microlithiasis
visualized.

Left testicle

Measurements: 4.2 x 2.5 x 3.1 cm. No mass or microlithiasis
visualized.

Right epididymis:  Normal in size and appearance.

Left epididymis:  Normal in size and appearance.

Hydrocele: Small complex right hydrocele with debris. Physiologic
fluid on the left.

Varicocele:  Present bilaterally.

Pulsed Doppler interrogation of both testes demonstrates normal low
resistance arterial and venous waveforms bilaterally.
IMPRESSION: Normal sonographic appearance of the bilateral testes.

No evidence of testicular torsion.

Small complex right hydrocele with debris.

Bilateral varicoceles.

## 2015-11-01 LAB — CUP PACEART REMOTE DEVICE CHECK
Battery Voltage: 2.98 V
Brady Statistic AP VP Percent: 7.38 %
Brady Statistic AS VP Percent: 91.5 %
Brady Statistic AS VS Percent: 1.11 %
HighPow Impedance: 60 Ohm
Implantable Lead Implant Date: 20150209
Implantable Lead Implant Date: 20160919
Implantable Lead Location: 753858
Implantable Lead Location: 753859
Implantable Lead Model: 5076
Lead Channel Impedance Value: 323 Ohm
Lead Channel Impedance Value: 342 Ohm
Lead Channel Impedance Value: 380 Ohm
Lead Channel Impedance Value: 456 Ohm
Lead Channel Impedance Value: 627 Ohm
Lead Channel Impedance Value: 646 Ohm
Lead Channel Pacing Threshold Amplitude: 1 V
Lead Channel Pacing Threshold Amplitude: 2.5 V
Lead Channel Pacing Threshold Pulse Width: 0.4 ms
Lead Channel Sensing Intrinsic Amplitude: 2.875 mV
Lead Channel Sensing Intrinsic Amplitude: 2.875 mV
MDC IDC LEAD IMPLANT DT: 20160919
MDC IDC LEAD LOCATION: 753860
MDC IDC LEAD MODEL: 4598
MDC IDC LEAD MODEL: 6935
MDC IDC MSMT BATTERY REMAINING LONGEVITY: 64 mo
MDC IDC MSMT LEADCHNL LV IMPEDANCE VALUE: 380 Ohm
MDC IDC MSMT LEADCHNL LV IMPEDANCE VALUE: 437 Ohm
MDC IDC MSMT LEADCHNL LV IMPEDANCE VALUE: 570 Ohm
MDC IDC MSMT LEADCHNL LV IMPEDANCE VALUE: 570 Ohm
MDC IDC MSMT LEADCHNL LV IMPEDANCE VALUE: 627 Ohm
MDC IDC MSMT LEADCHNL LV PACING THRESHOLD PULSEWIDTH: 0.4 ms
MDC IDC MSMT LEADCHNL RA PACING THRESHOLD AMPLITUDE: 0.875 V
MDC IDC MSMT LEADCHNL RA PACING THRESHOLD PULSEWIDTH: 0.4 ms
MDC IDC MSMT LEADCHNL RV IMPEDANCE VALUE: 323 Ohm
MDC IDC MSMT LEADCHNL RV IMPEDANCE VALUE: 399 Ohm
MDC IDC MSMT LEADCHNL RV SENSING INTR AMPL: 10.25 mV
MDC IDC MSMT LEADCHNL RV SENSING INTR AMPL: 10.25 mV
MDC IDC SESS DTM: 20170914212936
MDC IDC SET LEADCHNL LV PACING AMPLITUDE: 3 V
MDC IDC SET LEADCHNL LV PACING PULSEWIDTH: 0.4 ms
MDC IDC SET LEADCHNL RA PACING AMPLITUDE: 1.75 V
MDC IDC SET LEADCHNL RV PACING AMPLITUDE: 2 V
MDC IDC SET LEADCHNL RV PACING PULSEWIDTH: 0.4 ms
MDC IDC SET LEADCHNL RV SENSING SENSITIVITY: 0.3 mV
MDC IDC STAT BRADY AP VS PERCENT: 0.01 %
MDC IDC STAT BRADY RA PERCENT PACED: 7.39 %
MDC IDC STAT BRADY RV PERCENT PACED: 97.84 %

## 2015-11-27 ENCOUNTER — Telehealth: Payer: Self-pay | Admitting: Cardiology

## 2015-11-27 ENCOUNTER — Ambulatory Visit (INDEPENDENT_AMBULATORY_CARE_PROVIDER_SITE_OTHER): Payer: Self-pay

## 2015-11-27 DIAGNOSIS — Z95810 Presence of automatic (implantable) cardiac defibrillator: Secondary | ICD-10-CM

## 2015-11-27 DIAGNOSIS — I5042 Chronic combined systolic (congestive) and diastolic (congestive) heart failure: Secondary | ICD-10-CM

## 2015-11-27 NOTE — Telephone Encounter (Signed)
Spoke with pt and reminded pt of remote transmission that is due today. Pt verbalized understanding.   

## 2015-11-28 ENCOUNTER — Other Ambulatory Visit: Payer: Self-pay | Admitting: Family Medicine

## 2015-11-28 NOTE — Progress Notes (Signed)
EPIC Encounter for ICM Monitoring  Patient Name: Philip Chavez is a 65 y.o. male Date: 11/28/2015 Primary Care Physican: Junie Panning, DO Primary [place] Electrophysiologist: Lovena Le Dry Weight:    206 lb  Bi-V Pacing:  97.6%          Heart Failure questions reviewed, pt asymptomatic   Thoracic impedance normal   Recommendations: No changes.  Advised to limit salt intake to 2000 mg daily.  Encouraged to call for fluid symptoms.    Follow-up plan: ICM clinic phone appointment on 01/03/2016.  Copy of ICM check sent to device physician.   ICM trend: 11/27/2015       Rosalene Billings, RN 11/28/2015 2:34 PM

## 2015-12-11 ENCOUNTER — Telehealth (HOSPITAL_COMMUNITY): Payer: Self-pay

## 2015-12-11 ENCOUNTER — Other Ambulatory Visit (HOSPITAL_COMMUNITY): Payer: Self-pay | Admitting: *Deleted

## 2015-12-11 ENCOUNTER — Telehealth (HOSPITAL_COMMUNITY): Payer: Self-pay | Admitting: Pharmacist

## 2015-12-11 ENCOUNTER — Other Ambulatory Visit (HOSPITAL_COMMUNITY): Payer: Self-pay | Admitting: Pharmacist

## 2015-12-11 DIAGNOSIS — I5022 Chronic systolic (congestive) heart failure: Secondary | ICD-10-CM

## 2015-12-11 MED ORDER — SIMVASTATIN 40 MG PO TABS: 40.0000 mg | ORAL_TABLET | Freq: Every day | ORAL | 1 refills | Status: DC

## 2015-12-11 MED FILL — SIMVASTATIN 40 MG TABLET: 40 | 30 days supply | Qty: 30 | Fill #0

## 2015-12-11 NOTE — Telephone Encounter (Signed)
Called [facility] to initiate PA for Corlanor and was told that his coverage was suspended on 11/04/15 and will not be reinstated until 02/05/16. In the meantime, will send his simvastatin to our Outpatient Pharmacy via HF fund and provide samples of Entresto and Corlanor.   Medication Samples have been provided to the patient.  Drug name: Delene Loll       Strength: 24/26 mg        Qty: 56  LOT: [id number]  Exp.Date: 10/18  Dosing instructions: 1 tab BID  Medication Samples have been provided to the patient.  Drug name: Corlanor       Strength: 5 mg        Qty: 56  LOT: [id number]  Exp.Date: 4/18  Dosing instructions: 1 tab BID  The patient has been instructed regarding the correct time, dose, and frequency of taking this medication, including desired effects and most common side effects.    [name], PharmD, BCPS, CPP Clinical Pharmacist Pager: [phone] Phone: [phone] 12/11/2015 1:35 PM

## 2015-12-11 NOTE — Telephone Encounter (Signed)
Patient states he needs PA for Corlanor. Will forward to CHF clinic pharmacist Doroteo Bradford to assist.  Renee Pain, RN

## 2015-12-19 ENCOUNTER — Ambulatory Visit (HOSPITAL_COMMUNITY)
Admission: RE | Admit: 2015-12-19 | Discharge: 2015-12-19 | Disposition: A | Discharge status: Home / Self Care | Payer: Medicare Other | Source: Ambulatory Visit | Attending: Cardiology | Admitting: Cardiology

## 2015-12-19 ENCOUNTER — Encounter (HOSPITAL_COMMUNITY): Payer: Self-pay

## 2015-12-19 VITALS — BP 118/62 | HR 81 | Wt 214.5 lb

## 2015-12-19 DIAGNOSIS — Z8619 Personal history of other infectious and parasitic diseases: Secondary | ICD-10-CM | POA: Insufficient documentation

## 2015-12-19 DIAGNOSIS — N189 Chronic kidney disease, unspecified: Secondary | ICD-10-CM | POA: Insufficient documentation

## 2015-12-19 DIAGNOSIS — Z86718 Personal history of other venous thrombosis and embolism: Secondary | ICD-10-CM | POA: Insufficient documentation

## 2015-12-19 DIAGNOSIS — Z9889 Other specified postprocedural states: Secondary | ICD-10-CM | POA: Insufficient documentation

## 2015-12-19 DIAGNOSIS — E785 Hyperlipidemia, unspecified: Secondary | ICD-10-CM | POA: Diagnosis not present

## 2015-12-19 DIAGNOSIS — I471 Supraventricular tachycardia: Secondary | ICD-10-CM | POA: Diagnosis not present

## 2015-12-19 DIAGNOSIS — Z8249 Family history of ischemic heart disease and other diseases of the circulatory system: Secondary | ICD-10-CM | POA: Diagnosis not present

## 2015-12-19 DIAGNOSIS — I420 Dilated cardiomyopathy: Secondary | ICD-10-CM | POA: Insufficient documentation

## 2015-12-19 DIAGNOSIS — I480 Paroxysmal atrial fibrillation: Secondary | ICD-10-CM

## 2015-12-19 DIAGNOSIS — G4733 Obstructive sleep apnea (adult) (pediatric): Secondary | ICD-10-CM | POA: Insufficient documentation

## 2015-12-19 DIAGNOSIS — I2510 Atherosclerotic heart disease of native coronary artery without angina pectoris: Secondary | ICD-10-CM | POA: Insufficient documentation

## 2015-12-19 DIAGNOSIS — I5042 Chronic combined systolic (congestive) and diastolic (congestive) heart failure: Secondary | ICD-10-CM

## 2015-12-19 DIAGNOSIS — I4892 Unspecified atrial flutter: Secondary | ICD-10-CM | POA: Diagnosis not present

## 2015-12-19 DIAGNOSIS — I5022 Chronic systolic (congestive) heart failure: Secondary | ICD-10-CM | POA: Insufficient documentation

## 2015-12-19 DIAGNOSIS — I442 Atrioventricular block, complete: Secondary | ICD-10-CM | POA: Diagnosis not present

## 2015-12-19 DIAGNOSIS — Z7982 Long term (current) use of aspirin: Secondary | ICD-10-CM | POA: Diagnosis not present

## 2015-12-19 LAB — BASIC METABOLIC PANEL
Anion gap: 7 (ref 5–15)
BUN: 12 mg/dL (ref 6–20)
CALCIUM: 9.4 mg/dL (ref 8.9–10.3)
CO2: 26 mmol/L (ref 22–32)
Chloride: 103 mmol/L (ref 101–111)
Creatinine, Ser: 1.32 mg/dL — ABNORMAL HIGH (ref 0.61–1.24)
GFR calc Af Amer: >60 mL/min (ref >60)
GFR, EST NON AFRICAN AMERICAN: 55 mL/min — AB (ref >60)
GLUCOSE: 148 mg/dL — AB (ref 65–99)
Potassium: 4.1 mmol/L (ref 3.5–5.1)
Sodium: 136 mmol/L (ref 135–145)

## 2015-12-19 LAB — BRAIN NATRIURETIC PEPTIDE: B Natriuretic Peptide: 92.8 pg/mL (ref 0.0–100.0)

## 2015-12-19 MED ORDER — HYDRALAZINE HCL 10 MG PO TABS: 20.0000 mg | ORAL_TABLET | Freq: Three times a day (TID) | ORAL | 5 refills | Status: DC

## 2015-12-19 MED ORDER — CARVEDILOL 3.125 MG PO TABS: 3.1250 mg | ORAL_TABLET | Freq: Two times a day (BID) | ORAL | 3 refills | Status: DC

## 2015-12-19 MED ORDER — FUROSEMIDE 20 MG PO TABS: 20.0000 mg | ORAL_TABLET | Freq: Two times a day (BID) | ORAL | 5 refills | Status: DC

## 2015-12-19 MED ORDER — SPIRONOLACTONE 25 MG PO TABS: 25.0000 mg | ORAL_TABLET | Freq: Every day | ORAL | 5 refills | Status: DC

## 2015-12-19 MED ORDER — ISOSORBIDE MONONITRATE ER 30 MG PO TB24: 30.0000 mg | ORAL_TABLET | Freq: Every day | ORAL | 5 refills | Status: DC

## 2015-12-19 MED ORDER — SACUBITRIL-VALSARTAN 24-26 MG PO TABS: 1.0000 | ORAL_TABLET | Freq: Two times a day (BID) | ORAL | 5 refills | Status: DC

## 2015-12-19 MED ORDER — IVABRADINE HCL 5 MG PO TABS: 5.0000 mg | ORAL_TABLET | Freq: Two times a day (BID) | ORAL | 5 refills | Status: DC

## 2015-12-19 MED ORDER — DIGOXIN 125 MCG PO TABS: 125.0000 ug | ORAL_TABLET | Freq: Every day | ORAL | 5 refills | Status: DC

## 2015-12-19 MED FILL — SPIRONOLACTONE 25 MG TABLET: 25 | 30 days supply | Qty: 30 | Fill #0

## 2015-12-19 MED FILL — CARVEDILOL 3.125 MG TABLET: 3.125 | 30 days supply | Qty: 60 | Fill #0

## 2015-12-19 MED FILL — ISOSORBIDE MN ER 30 MG TAB: 30 | 30 days supply | Qty: 30 | Fill #0

## 2015-12-19 MED FILL — FUROSEMIDE 20 MG TABLET: 20 | 30 days supply | Qty: 60 | Fill #0

## 2015-12-19 MED FILL — DIGITEK 125 MCG TABLET: 125 | 30 days supply | Qty: 30 | Fill #0

## 2015-12-19 MED FILL — hydrALAZINE HCL 10 MG TABS: 10 | 30 days supply | Qty: 180 | Fill #0

## 2015-12-19 NOTE — Progress Notes (Signed)
Patient ID: Philip Chavez, male   DOB: Oct 03, 1950, 65 y.o.   MRN: [id number] EP: Dr. Lovena Le HF cardiologist: Dr Aundra Dubin  65 yo with history of complete heart block/Medtronic PPM, cardiomyopathy, and atrial flutter s/p ablation presents for CHF clinic evaluation.  Patient developed complete heart block in 2/15 and had PPM placed at that time.  He paces his RV continuously.  In 2/15, EF was 50-55% by echo.  By 6/15, EF had fallen to 20-25%.  Cardiolite showed possible scar but no ischemia.  On 09/03/14, he was admitted with dyspnea and found to be in atrial flutter with RLL PNA and with volume overload.  Echo showed EF 15% with diffuse hypokinesis.  He was started on IV Lasix and IV amiodarone. He converted back to NSR.  He subsequently had atrial flutter ablation.  He had right and left heart cath in 9/16, showing nonobstructive CAD and optimized filling pressures with CI 2.09.  He subsequently had upgrade to CRT by Dr Lovena Le.   Today he returns for HF follow up. Weight stable from last visit. Denies CP/SOB/PND/Orthopnea. He states he hasn't been taking Coreg for a long time, he feels like he was told to stop because he was on Corlanor. Feeling great overall.  Remains very active.  Taking all medication as directed. Weight at home ~210-212. Denies lightheadedness or dizziness. Appetite is great.   Optivol: No VT. He is having several blips of AT/AF. Likely SVT. Device clinic following. Fluid index well below threshold. Pt activity between 2-3 hrs daily.   Labs (8/16): K 3.9, creatinine 1.03, AST 106, ALT 173, HCT 45.1, LDL 132 Labs (9/16): K 3.9, creatinine 1.16, HCT 42.5 Labs (10/16): K 3.9, creatinine 1.32, HCT 39.9 Labs (2/17): K 4.1, creatinine 1.26, BNP 107, digoxin 0.3 Labs (07/10/2015): K 3.9 Creatinine 1.23   PMH: 1. H/o complete heart block: Has Medtronic PPM, placed in 2/15.   2. Cardiomyopathy: Echo (2/15) prior to PPM placement with EF 50-55%.  Echo (6/15) with EF 20-25%,  Cardiolite at that  time showed possible scar but no ischemia.  Echo (8/16) with EF 15%, diffuse hypokinesis, mildly dilated RV with normal systolic function, PA systolic pressure 69 mmHg.  LHC/RHC (9/16) with mean RA 4, PA 31/15 mean 20, mean PCWP 8, CI 2.09, 40% mLAD stenosis.  Upgrade to MDT CRT-D in 9/16.  - Echo (4/17) with EF 30-35%.   3. CKD 4. 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  5. Atrial tachycardia: paroxysmal. 6. Atrial flutter: s/p ablation in 8/16.  7. OSA 8. Hyperlipidemia 9. Shingles with post-herpetic neuralgia.   FH: Father with CHF, complete heart block with PPM.  Sister with complete heart block and PPM.   SH: Retired Curator, nonsmoker, lives in [place]: All systems reviewed and negative except as per HPI  Current Outpatient Prescriptions  Medication Sig Dispense Refill  . aspirin EC 81 MG tablet Take 1 tablet (81 mg total) by mouth daily. 30 tablet 3  . CORLANOR 5 MG TABS tablet TAKE 1 TABLET TWICE DAILY WITH MEALS 60 tablet 4  . digoxin (LANOXIN) 0.125 MG tablet Take 1 tablet (125 mcg total) by mouth daily. 90 tablet 2  . ENTRESTO 24-26 MG TAKE 1 TABLET BY MOUTH 2 (TWO) TIMES DAILY.(NEEDS PRIOR AUTH) 60 tablet 3  . furosemide (LASIX) 20 MG tablet TAKE 1 TABLET BY MOUTH TWICE A DAY 180 tablet 1  . hydrALAZINE (APRESOLINE) 10 MG tablet Take 2 tablets (20 mg total) by mouth 3 (three)  times daily. 180 tablet 6  . isosorbide mononitrate (IMDUR) 30 MG 24 hr tablet Take 1 tablet (30 mg total) by mouth daily. 90 tablet 3  . simvastatin (ZOCOR) 40 MG tablet Take 1 tablet (40 mg total) by mouth daily. 30 tablet 1  . spironolactone (ALDACTONE) 25 MG tablet TAKE 1 TABLET EVERY DAY 30 tablet 2  . acetaminophen (TYLENOL) 325 MG tablet Take 1-2 tablets (325-650 mg total) by mouth every 4 (four) hours as needed for mild pain. (Patient not taking: Reported on 12/19/2015)     No current facility-administered medications for this encounter.    BP 118/62   Pulse 81   Wt 214 lb 8 oz (97.3  kg)   SpO2 98%   BMI 28.30 kg/m    Wt Readings from Last 3 Encounters:  12/19/15 214 lb 8 oz (97.3 kg)  08/29/15 215 lb 12 oz (97.9 kg)  08/17/15 213 lb 9.6 oz (96.9 kg)    General: NAD. Ambulated in the clinic without difficulty.  Neck: JVD does not appear elevated. No thyromegaly or thyroid nodule.  Lungs: CTAB, normal effort CV: Nondisplaced PMI.  Heart regular S1/S2, no S3/S4, no murmur.  No peripheral edema.  No carotid bruit.  Normal pedal pulses. L upper chest ICD stable. Abdomen: Soft, NT, ND, no HSM. No bruits or masses. +BS  Skin: Intact without lesions or rashes.  Neurologic: Alert and oriented x 3.  Psych: Normal affect. Extremities: No clubbing or cyanosis.  HEENT: Normal.   Assessment/Plan: 1. Chronic systolic CHF: Nonischemic cardiomyopathy.  EF 15% on echo in 8/16.  Patient had borderline reduced EF when PPM was placed in 2/15.  After that, EF fell significantly.  He has a history of complete heart block and father had CHF and CHB with pacemaker, and sounds like sister also had CHB with pacemaker. Concern genetic dilated cardiomyopathy associated with CHB such as LMNA or SCN5A. Per Dr Aundra Dubin could because of chronic RV pacing.   - He has had CRT upgrade.  No significant CAD on last cath.  Echo in 4/17 showed improvement in EF to 30-35%.   - NYHA I. Volume status stable currently.  - Continue digoxin 0.125 mg daily - Restart coreg 3.25 mg BID.  - Continue lasix 20 mg BID. BMET/BNP today. - Continue hydralazine 20 mg TID and imdur 20 mg daily.  - Continue spironolactone 25 mg daily - Continue ivabradine 5 mg BID. Will have to watch HR with resumption of coreg.  Will see pharmacist in 4 weeks. - Continue Entresto 24/26 bid.  - Will need to consider genetic evaluation to look for LMNA or SCN5A mutations that could be used for familial screening. He would like to hold off.  2. Atrial flutter: s/p ablation. He is on ASA 81 daily.  - Occasional SVT? No prolonged A fib.   3. PE/DVT:  - >1 year ago and associated with surgery.  4. Hyperlipidemia:  -He has mild CAD.  Continue ASA 81 and statin.   Resume coreg as above. Follow up 4 weeks with Pharm-D for med titration. Follow up with Dr. Aundra Dubin in 3 months.  Labs today.  Shirley Friar, PA-C 12/19/2015  Total time spent > 25 minutes. Over half that time spent discussing the above.

## 2015-12-19 NOTE — Progress Notes (Addendum)
Advanced Heart Failure Medication Review by a Pharmacist  Does the patient  feel that his/her medications are working for him/her?  yes  Has the patient been experiencing any side effects to the medications prescribed?  no  Does the patient measure his/her own blood pressure or blood glucose at home?  no   Does the patient have any problems obtaining medications due to transportation or finances?   No - switched from [facility] to [facility] (need new PA's)  Understanding of regimen: good Understanding of indications: good Potential of compliance: good Patient understands to avoid NSAIDs. Patient understands to avoid decongestants.  Issues to address at subsequent visits: None   Pharmacist comments:  Philip Chavez is a 65 yo M presenting with a current medication list. He reports good compliance with his regimen but states that he stopped taking carvedilol when he was prescribed Corlanor because he thinks that one of our MD's discontinued it. He did not have any other medication-related questions or concerns for me at this time.   [name], PharmD, BCPS, CPP Clinical Pharmacist Pager: [phone] Phone: [phone] 12/19/2015 11:46 AM      Time with patient: 10 minutes Preparation and documentation time: 2 minutes Total time: 12 minutes

## 2015-12-19 NOTE — Patient Instructions (Signed)
START Coreg 3.125 mg (1 tablet) twice daily.  Routine lab work today. Will notify you of abnormal results  Follow up with Doroteo Bradford (pharmacist) in 4 weeks.  Follow up with Dr.McLean in 3 months.

## 2016-01-01 ENCOUNTER — Other Ambulatory Visit: Payer: Self-pay | Admitting: Internal Medicine

## 2016-01-03 ENCOUNTER — Ambulatory Visit (INDEPENDENT_AMBULATORY_CARE_PROVIDER_SITE_OTHER): Payer: Medicare Other

## 2016-01-03 DIAGNOSIS — I5042 Chronic combined systolic (congestive) and diastolic (congestive) heart failure: Secondary | ICD-10-CM | POA: Diagnosis not present

## 2016-01-03 DIAGNOSIS — Z95810 Presence of automatic (implantable) cardiac defibrillator: Secondary | ICD-10-CM

## 2016-01-03 DIAGNOSIS — I5022 Chronic systolic (congestive) heart failure: Secondary | ICD-10-CM | POA: Diagnosis not present

## 2016-01-04 ENCOUNTER — Telehealth: Payer: Self-pay

## 2016-01-04 NOTE — Progress Notes (Signed)
EPIC Encounter for ICM Monitoring  Patient Name: Philip Chavez is a 65 y.o. male Date: 01/04/2016 Primary Care Physican: Junie Panning, DO Primary [place] Electrophysiologist: Lovena Le Dry Weight:206lb  Bi-V Pacing: 96.9%    Attempted ICM call and unable to reach. Left message to return call.  Transmission reviewed.   Thoracic impedance normal   Follow-up plan: ICM clinic phone appointment on 02/06/2016.  Copy of ICM check sent to device physician.   ICM trend: 01/03/2016       Rosalene Billings, RN 01/04/2016 3:26 PM

## 2016-01-04 NOTE — Telephone Encounter (Signed)
Remote ICM transmission received.  Attempted patient call and left message to return call.   

## 2016-01-08 ENCOUNTER — Telehealth (HOSPITAL_COMMUNITY): Payer: Self-pay | Admitting: *Deleted

## 2016-01-08 ENCOUNTER — Telehealth: Payer: Self-pay

## 2016-01-08 NOTE — Telephone Encounter (Signed)
Received call from patient.  He is requesting samples of Corlanor due to there is a problem with his insurance and he cannot afford to pay for the medication out of pocket.  Advised HF clinic had ordered refill on 12/19/2015 and will send to clinic for follow up.

## 2016-01-08 NOTE — Telephone Encounter (Signed)
Pt can not afford to get Corlanor filled until Jan, samples left for him at front desk to cover the rest of the year.  Pt aware to p/u later today and is very grateful.  Medication Samples have been provided to the patient.  Drug name: Corlanor       Strength: 5mg         Qty: 5  LOTOT:[id number]  Exp.Date: 5/18  Dosing instructions: 1 tab Twice daily   The patient has been instructed regarding the correct time, dose, and frequency of taking this medication, including desired effects and most common side effects.   Marelin Tat 1:27 PM 01/08/2016

## 2016-01-12 ENCOUNTER — Other Ambulatory Visit: Payer: Self-pay | Admitting: Internal Medicine

## 2016-01-14 ENCOUNTER — Other Ambulatory Visit: Payer: Self-pay | Admitting: Internal Medicine

## 2016-01-17 ENCOUNTER — Ambulatory Visit (HOSPITAL_COMMUNITY)
Admission: RE | Admit: 2016-01-17 | Discharge: 2016-01-17 | Disposition: A | Discharge status: Home / Self Care | Payer: Medicare Other | Source: Ambulatory Visit | Attending: Cardiology | Admitting: Cardiology

## 2016-01-17 DIAGNOSIS — I5022 Chronic systolic (congestive) heart failure: Secondary | ICD-10-CM | POA: Insufficient documentation

## 2016-01-17 DIAGNOSIS — Z8249 Family history of ischemic heart disease and other diseases of the circulatory system: Secondary | ICD-10-CM | POA: Diagnosis not present

## 2016-01-17 DIAGNOSIS — I2510 Atherosclerotic heart disease of native coronary artery without angina pectoris: Secondary | ICD-10-CM | POA: Diagnosis not present

## 2016-01-17 DIAGNOSIS — I4892 Unspecified atrial flutter: Secondary | ICD-10-CM | POA: Diagnosis not present

## 2016-01-17 DIAGNOSIS — Z95810 Presence of automatic (implantable) cardiac defibrillator: Secondary | ICD-10-CM | POA: Insufficient documentation

## 2016-01-17 DIAGNOSIS — E785 Hyperlipidemia, unspecified: Secondary | ICD-10-CM | POA: Diagnosis not present

## 2016-01-17 DIAGNOSIS — Z79899 Other long term (current) drug therapy: Secondary | ICD-10-CM | POA: Insufficient documentation

## 2016-01-17 DIAGNOSIS — Z7982 Long term (current) use of aspirin: Secondary | ICD-10-CM | POA: Diagnosis not present

## 2016-01-17 MED ORDER — IVABRADINE HCL 5 MG PO TABS: 2.5000 mg | ORAL_TABLET | Freq: Two times a day (BID) | ORAL | 5 refills | Status: DC

## 2016-01-17 MED ORDER — CARVEDILOL 6.25 MG PO TABS: 6.2500 mg | ORAL_TABLET | Freq: Two times a day (BID) | ORAL | 5 refills | Status: DC

## 2016-01-17 MED FILL — CARVEDILOL 6.25 MG TABLET: 6.25 | 30 days supply | Qty: 60 | Fill #0

## 2016-01-17 NOTE — Patient Instructions (Addendum)
It was great to see you today!  Please INCREASE your carvedilol to 6.25 mg TWICE DAILY.  Please DECREASE your Corlanor to 2.5 mg (1/2 TABLET) TWICE DAILY.   Make an appointment to see Doroteo Bradford (pharmacist) in 1 month. Make an appointment to see Dr. Aundra Dubin in 2 months.

## 2016-01-17 NOTE — Progress Notes (Signed)
HPI:  65 yo AA male with history of complete heart block/Medtronic PPM, cardiomyopathy, and atrial flutter s/p ablation presents for CHF clinic evaluation.  Patient developed complete heart block in 2/15 and had PPM placed at that time.  He paces his RV continuously.  In 2/15, EF was 50-55% by echo.  By 6/15, EF had fallen to 20-25%.  Cardiolite showed possible scar but no ischemia.  On 09/03/14, he was admitted with dyspnea and found to be in atrial flutter with RLL PNA and with volume overload.  Echo showed EF 15% with diffuse hypokinesis.  He was started on IV Lasix and IV amiodarone. He converted back to NSR.  He subsequently had atrial flutter ablation.  He had right and left heart cath in 9/16, showing nonobstructive CAD and optimized filling pressures with CI 2.09.  He subsequently had upgrade to CRT by Dr Lovena Le.   Today he returns for pharmacist-led HF medication titration. At last HF clinic visit on 11/14, his carvedilol 3.125 mg BID was restarted (thought that he was told to stop it when Corlanor was added). Feeling great overall.  Remains very active.  Taking all medications as directed. Appetite is great and feels that he has gained some weight because of this. No evidence of fluid overload.     . Shortness of breath/dyspnea on exertion? no  . Orthopnea/PND? no . Edema? no . Lightheadedness/dizziness? no . Daily weights at home? Yes - ~205-210 . Blood pressure/heart rate monitoring at home? no . Following low-sodium/fluid-restricted diet? Yes - appetite increased though  HF Medications: Carvedilol 3.125 mg PO BID Digoxin 0.125 mg PO daily Furosemide 20 mg PO BID Hydralazine 20 mg PO TID Isosorbide mononitrate 30 mg PO daily Corlanor 5 mg PO BID Entresto 24-26 mg PO BID Spironolactone 25 mg PO daily   Has the patient been experiencing any side effects to the medications prescribed?  no  Does the patient have any problems obtaining medications due to transportation or finances?    Yes - using HF fund until [place] reinstated on 02/05/16  Understanding of regimen: fair Understanding of indications: fair Potential of compliance: good Patient understands to avoid NSAIDs. Patient understands to avoid decongestants.    Pertinent Lab Values: . 12/19/15: Serum creatinine 1.32 (BL ~1.2-1.3), BUN 12, Potassium 4.1, Sodium 136, BNP 92.8 . 06/26/15: Digoxin 0.5   Vital Signs: . Weight: 216 lb (dry weight: 213-215 lb) . Blood pressure: 124/88 mmHg  . Heart rate: 74 bpm   Assessment: 1. Chronicsystolic CHF (EF 99991111 on ECHO 4/17), due to NICM s/p PPM 2/15. He has a history of complete heart block and father had CHF and CHB with pacemaker, and sounds like sister also had CHB with pacemaker. Concern genetic dilated cardiomyopathy associated with CHB such as LMNA or SCN5A. Per Dr Aundra Dubin could because of chronic RV pacing.  NYHA class Isymptoms.  - Volume status stable today  - Increase carvedilol to 6.25 mg BID and reduce Corlanor to 2.5 mg BID with plan to maximize carvedilol and potentially d/c Corlanor (especially with compliance issues)   - Continue digoxin 0.125 mg daily, furosemide 20 mg BID, hydralazine 20 mg TID, Imdur 30 mg daily, Entresto 24-26 mg BID and spironolactone 25 mg daily  - Basic disease state pathophysiology, medication indication, mechanism and side effects reviewed at length with patient and he verbalized understanding 2. Atrial flutter s/p ablation  - He is on ASA 81 daily - Occasional SVT? No prolonged A fib 3. PE/DVT - >1 year  ago and associated with surgery off Xarelto 4. Hyperlipidemia:   - He has mild CAD  - Continue ASA 81 and simvastatin  Plan: 1) Medication changes: Based on clinical presentation, vital signs and recent labs will increase carvedilol to 6.25 mg BID and decrease Corlanor to 2.5 mg BID  2) Labs: Mag and Dig at next visit 3) Follow-up: 1 mo with pharmacist and 2 mo with Dr. Mickel Crow K. Velva Harman, PharmD, BCPS,  CPP Clinical Pharmacist Pager: [phone] Phone: [phone] 01/17/2016 2:08 PM

## 2016-01-26 ENCOUNTER — Telehealth (HOSPITAL_COMMUNITY): Payer: Self-pay | Admitting: Pharmacist

## 2016-01-26 NOTE — Telephone Encounter (Signed)
Philip Chavez called stating he needed more samples of Entresto to get him through the remainder of the year until his insurance is reinstated.   Medication Samples have been provided to the patient.  Drug name: Delene Loll       Strength: 24-26 mg        Qty: 56  LOT: [id number]  Exp.Date: 10/18  Dosing instructions: Take 1 tablet twice daily   The patient has been instructed regarding the correct time, dose, and frequency of taking this medication, including desired effects and most common side effects.   [name] 1:17 PM 01/26/2016

## 2016-02-06 ENCOUNTER — Ambulatory Visit (INDEPENDENT_AMBULATORY_CARE_PROVIDER_SITE_OTHER): Payer: Medicare Other

## 2016-02-06 DIAGNOSIS — Z95810 Presence of automatic (implantable) cardiac defibrillator: Secondary | ICD-10-CM

## 2016-02-06 DIAGNOSIS — I5042 Chronic combined systolic (congestive) and diastolic (congestive) heart failure: Secondary | ICD-10-CM | POA: Diagnosis not present

## 2016-02-06 NOTE — Progress Notes (Signed)
EPIC Encounter for ICM Monitoring  Patient Name: Philip Chavez is a 66 y.o. male Date: 02/06/2016 Primary Care Physican: Junie Panning, DO Primary [place] Electrophysiologist: Lovena Le Dry Weight:206lb  Bi-V Pacing: 97%   Clinical Status (03-Jan-2016 to 06-Feb-2016) AT/AF 1 episode Time in AT/AF 0.1 hr/day (0.6%)     Heart Failure questions reviewed, pt asymptomatic   Thoracic impedance slightly abnormal suggesting fluid accumulation since 01/30/2016. Marland Kitchen  Recommendations: No changes.  Reinforced to limit low salt food choices to 2000 mg day and limiting fluid intake to < 2 liters per day. Encouraged to call for fluid symptoms.    Follow-up plan: ICM clinic phone appointment on 03/08/2016.  HF clinic appointment 03/19/2016  Copy of ICM check sent to device physician.   3 month ICM trend : 02/06/2016   1 Year ICM trend:      Rosalene Billings, RN 02/06/2016 8:57 AM

## 2016-02-07 MED FILL — hydrALAZINE HCL 10 MG TABS: 10 | 30 days supply | Qty: 180 | Fill #1

## 2016-02-15 MED FILL — SPIRONOLACTONE 25 MG TABLET: 25 | 30 days supply | Qty: 30 | Fill #1

## 2016-02-19 ENCOUNTER — Ambulatory Visit (HOSPITAL_COMMUNITY)
Admission: RE | Admit: 2016-02-19 | Discharge: 2016-02-19 | Disposition: A | Discharge status: Home / Self Care | Payer: Medicare Other | Source: Ambulatory Visit | Attending: Cardiology | Admitting: Cardiology

## 2016-02-19 DIAGNOSIS — Z79899 Other long term (current) drug therapy: Secondary | ICD-10-CM | POA: Insufficient documentation

## 2016-02-19 DIAGNOSIS — Z7901 Long term (current) use of anticoagulants: Secondary | ICD-10-CM | POA: Diagnosis not present

## 2016-02-19 DIAGNOSIS — I5022 Chronic systolic (congestive) heart failure: Secondary | ICD-10-CM | POA: Diagnosis not present

## 2016-02-19 DIAGNOSIS — I4892 Unspecified atrial flutter: Secondary | ICD-10-CM | POA: Diagnosis not present

## 2016-02-19 DIAGNOSIS — Z86718 Personal history of other venous thrombosis and embolism: Secondary | ICD-10-CM | POA: Insufficient documentation

## 2016-02-19 DIAGNOSIS — I5042 Chronic combined systolic (congestive) and diastolic (congestive) heart failure: Secondary | ICD-10-CM

## 2016-02-19 DIAGNOSIS — I2510 Atherosclerotic heart disease of native coronary artery without angina pectoris: Secondary | ICD-10-CM | POA: Insufficient documentation

## 2016-02-19 DIAGNOSIS — E785 Hyperlipidemia, unspecified: Secondary | ICD-10-CM | POA: Insufficient documentation

## 2016-02-19 LAB — BASIC METABOLIC PANEL
Anion gap: 8 (ref 5–15)
BUN: 15 mg/dL (ref 6–20)
CHLORIDE: 104 mmol/L (ref 101–111)
CO2: 24 mmol/L (ref 22–32)
Calcium: 9 mg/dL (ref 8.9–10.3)
Creatinine, Ser: 1.18 mg/dL (ref 0.61–1.24)
GFR calc Af Amer: >60 mL/min (ref >60)
GFR calc non Af Amer: >60 mL/min (ref >60)
GLUCOSE: 84 mg/dL (ref 65–99)
POTASSIUM: 4.1 mmol/L (ref 3.5–5.1)
Sodium: 136 mmol/L (ref 135–145)

## 2016-02-19 LAB — MAGNESIUM: Magnesium: 1.8 mg/dL (ref 1.7–2.4)

## 2016-02-19 LAB — DIGOXIN LEVEL: Digoxin Level: 0.3 ng/mL — ABNORMAL LOW (ref 0.8–2.0)

## 2016-02-19 MED FILL — CARVEDILOL 6.25 MG TABLET: 6.25 | 30 days supply | Qty: 60 | Fill #1

## 2016-02-19 MED FILL — ENTRESTO 24 MG-26 MG TABLET: 24-26 | 30 days supply | Qty: 60 | Fill #0

## 2016-02-19 NOTE — Patient Instructions (Addendum)
Please pick up the new prescription for carvedilol and start taking the 6.25mg  twice a day  STOP taking the Corlanor 2.5 mg PO BID  We drew labs today and will call you if there are any issues  Follow up with Dr. Aundra Dubin, February 14th at 1:40 PM

## 2016-02-19 NOTE — Progress Notes (Signed)
HPI:  66 yo AA male with history of complete heart block/Medtronic PPM, cardiomyopathy, and atrial flutter s/p ablation presents for CHF clinic evaluation. Patient developed complete heart block in 2/15 and had PPM placed at that time. He paces his RV continuously. In 2/15, EF was 50-55% by echo. By 6/15, EF had fallen to 20-25%. Cardiolite showed possible scar but no ischemia. On 09/03/14, he was admitted with dyspnea and found to be in atrial flutter with RLL PNA and with volume overload. Echo showed EF 15% with diffuse hypokinesis. He was started on IV Lasix and IV amiodarone. He converted back to NSR. He subsequently had atrial flutter ablation. He had right and left heart cath in 9/16, showing nonobstructive CAD and optimized filling pressures with CI 2.09. He subsequently had upgrade to CRT by Dr Lovena Le.   Today he returns for pharmacist-led HF medication titration. At last pharmacy visit on 01/17/16, his carvedilol was increased to 6.25 mg BID and Corlanor was reduced to 2.5 mg BID. However, patient never picked up Coreg prescription. He did say that he was taking the reduced dose of Corlanor. Feeling great overall. Remains very active. Appetite is great and and just recently started to "eat a little less" - feels very happy about his current weight. No evidence of fluid overload.     . Shortness of breath/dyspnea on exertion? no  . Orthopnea/PND? no . Edema? no . Lightheadedness/dizziness? no . Daily weights at home? Yes - ~213-215 lb . Blood pressure/heart rate monitoring at home? no . Following low-sodium/fluid-restricted diet? yes  HF Medications: Carvedilol 3.125 mg PO BID Digoxin 0.125 mg PO daily Furosemide 20 mg PO BID Hydralazine 20 mg PO TID Isosorbide mononitrate 30 mg PO daily Corlanor 2.5 mg PO BID Entresto 24-26 mg PO BID Spironolactone 25 mg PO daily   Has the patient been experiencing any side effects to the medications prescribed?  no  Does the patient  have any problems obtaining medications due to transportation or finances?   Yes - added insurance card to chart ([employer] commercial) - may use copay cards for Entresto and [employer] of regimen: good Understanding of indications: good Potential of compliance: good Patient understands to avoid NSAIDs. Patient understands to avoid decongestants.    Pertinent Lab Values: . 02/19/16: Serum creatinine 1.18 (BL ~1.1-1.2), BUN 15, Potassium 4.1, Sodium 136,  Magnesium 1.8, Digoxin 0.3   Vital Signs: . Weight: 216 (dry weight: 213-215 lb) . Blood pressure: 114/84 mmHg . Heart rate: 64 bpm  Assessment: 1. Chronicsystolic CHF (EF 99991111 on ECHO 4/17), due to NICM s/p PPM 2/15. He has a history of complete heart block and father had CHF and CHB with pacemaker, and sounds like sister also had CHB with pacemaker. Concern genetic dilated cardiomyopathy associated with CHB such as LMNA or SCN5A. Per Dr Aundra Dubin could because of chronic RV pacing. NYHA class Isymptoms.  - Volume status stable today. BP and HR lower side but patient does have pacemaker.  - Instruct patient to pick up new prescription and continue with plan for increased carvedilol 6.25 mg BID   - Stop Corlanor 2.5 mg BID  - Continue digoxin 0.125 mg daily, furosemide 20 mg BID, hydralazine 20 mg TID, Imdur 30 mg daily, Entresto 24-26 mg BID and spironolactone 25 mg daily  - Basic disease state pathophysiology, medication indication, mechanism and side effects reviewed at length with patient and he verbalized understanding 2. Atrial flutter s/p ablation  - He is on ASA 81 daily -  Occasional SVT? No prolonged A fib 3. PE/DVT - >1 year ago and associated with surgery off Xarelto 4. Hyperlipidemia:   - He has mild CAD  - Continue ASA 81 and simvastatin  Plan: 1) Medication changes: Based on clinical presentation, vital signs and recent labs will instruct patient to pick up new prescription and continue with plan for  increased carvedilol 6.25 mg BID. Stop Corlanor 2.5 mg BID. Continue all other heart failure medications. 2) Labs: BMET, Mg, digoxin level today  3) Follow-up: February 13th, 2018 with Dr. Letitia Neri, PharmD, [place] PGY2 Pharmacy Resident  [name], PharmD, BCPS, CPP Clinical Pharmacist Pager: [phone] Phone: [phone] 02/19/2016 2:46 PM

## 2016-03-08 ENCOUNTER — Ambulatory Visit (INDEPENDENT_AMBULATORY_CARE_PROVIDER_SITE_OTHER): Payer: Medicare Other

## 2016-03-08 DIAGNOSIS — Z95810 Presence of automatic (implantable) cardiac defibrillator: Secondary | ICD-10-CM | POA: Diagnosis not present

## 2016-03-08 DIAGNOSIS — I5042 Chronic combined systolic (congestive) and diastolic (congestive) heart failure: Secondary | ICD-10-CM

## 2016-03-11 MED FILL — hydrALAZINE HCL 10 MG TABS: 10 | 30 days supply | Qty: 180 | Fill #2

## 2016-03-11 NOTE — Progress Notes (Signed)
EPIC Encounter for ICM Monitoring  Patient Name: Philip Chavez is a 66 y.o. male Date: 03/11/2016 Primary Care Physican: Junie Panning, DO Primary [place] Electrophysiologist: Lovena Le Dry Weight:206lb  Bi-V Pacing: 97.9%      Heart Failure questions reviewed, pt asymptomatic   Thoracic impedance normal   Recommendations: No changes. Reminded to limit dietary salt intake to 2000 mg/day and fluid intake to < 2 liters/day. Encouraged to call for fluid symptoms.  Follow-up plan: ICM clinic phone appointment on 04/11/2016.  HF clinic appointment 03/19/2016  Copy of ICM check sent to device physician.   3 month ICM trend: 03/11/2016   1 Year ICM trend:      Rosalene Billings, RN 03/11/2016 4:29 PM

## 2016-03-15 MED FILL — SPIRONOLACTONE 25 MG TABLET: 25 | 30 days supply | Qty: 30 | Fill #2

## 2016-03-19 ENCOUNTER — Encounter (HOSPITAL_COMMUNITY): Payer: Self-pay

## 2016-03-19 ENCOUNTER — Ambulatory Visit (HOSPITAL_COMMUNITY)
Admission: RE | Admit: 2016-03-19 | Discharge: 2016-03-19 | Disposition: A | Discharge status: Home / Self Care | Payer: Medicare Other | Source: Ambulatory Visit | Attending: Cardiology | Admitting: Cardiology

## 2016-03-19 VITALS — BP 111/75 | HR 80 | Wt 216.5 lb

## 2016-03-19 DIAGNOSIS — Z7982 Long term (current) use of aspirin: Secondary | ICD-10-CM | POA: Insufficient documentation

## 2016-03-19 DIAGNOSIS — I4892 Unspecified atrial flutter: Secondary | ICD-10-CM | POA: Diagnosis not present

## 2016-03-19 DIAGNOSIS — Z950 Presence of cardiac pacemaker: Secondary | ICD-10-CM | POA: Diagnosis not present

## 2016-03-19 DIAGNOSIS — I5042 Chronic combined systolic (congestive) and diastolic (congestive) heart failure: Secondary | ICD-10-CM

## 2016-03-19 DIAGNOSIS — G4733 Obstructive sleep apnea (adult) (pediatric): Secondary | ICD-10-CM | POA: Insufficient documentation

## 2016-03-19 DIAGNOSIS — Z86711 Personal history of pulmonary embolism: Secondary | ICD-10-CM

## 2016-03-19 DIAGNOSIS — N529 Male erectile dysfunction, unspecified: Secondary | ICD-10-CM | POA: Diagnosis not present

## 2016-03-19 DIAGNOSIS — Z9889 Other specified postprocedural states: Secondary | ICD-10-CM | POA: Diagnosis not present

## 2016-03-19 DIAGNOSIS — Z8249 Family history of ischemic heart disease and other diseases of the circulatory system: Secondary | ICD-10-CM | POA: Insufficient documentation

## 2016-03-19 DIAGNOSIS — N189 Chronic kidney disease, unspecified: Secondary | ICD-10-CM | POA: Insufficient documentation

## 2016-03-19 DIAGNOSIS — I442 Atrioventricular block, complete: Secondary | ICD-10-CM | POA: Insufficient documentation

## 2016-03-19 DIAGNOSIS — I429 Cardiomyopathy, unspecified: Secondary | ICD-10-CM | POA: Diagnosis not present

## 2016-03-19 DIAGNOSIS — I2510 Atherosclerotic heart disease of native coronary artery without angina pectoris: Secondary | ICD-10-CM | POA: Insufficient documentation

## 2016-03-19 DIAGNOSIS — I5022 Chronic systolic (congestive) heart failure: Secondary | ICD-10-CM | POA: Diagnosis not present

## 2016-03-19 DIAGNOSIS — Z86718 Personal history of other venous thrombosis and embolism: Secondary | ICD-10-CM | POA: Diagnosis not present

## 2016-03-19 DIAGNOSIS — E785 Hyperlipidemia, unspecified: Secondary | ICD-10-CM | POA: Diagnosis not present

## 2016-03-19 LAB — BASIC METABOLIC PANEL
ANION GAP: 9 (ref 5–15)
BUN: 13 mg/dL (ref 6–20)
CHLORIDE: 103 mmol/L (ref 101–111)
CO2: 23 mmol/L (ref 22–32)
Calcium: 9.2 mg/dL (ref 8.9–10.3)
Creatinine, Ser: 1.24 mg/dL (ref 0.61–1.24)
GFR calc Af Amer: >60 mL/min (ref >60)
GFR calc non Af Amer: 59 mL/min — ABNORMAL LOW (ref >60)
GLUCOSE: 86 mg/dL (ref 65–99)
POTASSIUM: 4 mmol/L (ref 3.5–5.1)
Sodium: 135 mmol/L (ref 135–145)

## 2016-03-19 LAB — DIGOXIN LEVEL: Digoxin Level: 0.4 ng/mL — ABNORMAL LOW (ref 0.8–2.0)

## 2016-03-19 MED ORDER — ISOSORB DINITRATE-HYDRALAZINE 20-37.5 MG PO TABS: 1.0000 | ORAL_TABLET | Freq: Three times a day (TID) | ORAL | 2 refills | Status: DC

## 2016-03-19 MED ORDER — BISOPROLOL FUMARATE 5 MG PO TABS: 2.5000 mg | ORAL_TABLET | Freq: Every day | ORAL | 3 refills | Status: DC

## 2016-03-19 MED FILL — BIDIL TABLET: 20-37.5 | 30 days supply | Qty: 90 | Fill #0

## 2016-03-19 MED FILL — BISOPROLOL FUMARATE 5 MG TA: 5 | 30 days supply | Qty: 15 | Fill #0

## 2016-03-19 NOTE — Progress Notes (Signed)
Patient ID: Delray Randel, male   DOB: 07-29-1950, 66 y.o.   MRN: [id number] PCP: Dr. Gerlean Ren EP: Dr. Lovena Le HF cardiologist: Dr Aundra Dubin  66 yo with history of complete heart block/Medtronic PPM, cardiomyopathy, and atrial flutter s/p ablation presents for CHF clinic evaluation.  Patient developed complete heart block in 2/15 and had PPM placed at that time.  He paces his RV continuously.  In 2/15, EF was 50-55% by echo.  By 6/15, EF had fallen to 20-25%.  Cardiolite showed possible scar but no ischemia.  On 09/03/14, he was admitted with dyspnea and found to be in atrial flutter with RLL PNA and with volume overload.  Echo showed EF 15% with diffuse hypokinesis.  He was started on IV Lasix and IV amiodarone. He converted back to NSR.  He subsequently had atrial flutter ablation.  He had right and left heart cath in 9/16, showing nonobstructive CAD and optimized filling pressures with CI 2.09.  He subsequently had upgrade to CRT by Dr Lovena Le.   He is doing well today.  Only complaint is erectile dysfunction, he thinks Coreg is causing this and wants to stop it.  No exertional dyspnea.  No chest pain.  No orthopnea/PND.  Generally able to due what he wants. Weight stable.    Labs (8/16): K 3.9, creatinine 1.03, AST 106, ALT 173, HCT 45.1, LDL 132 Labs (9/16): K 3.9, creatinine 1.16, HCT 42.5 Labs (10/16): K 3.9, creatinine 1.32, HCT 39.9 Labs (2/17): K 4.1, creatinine 1.26, BNP 107, digoxin 0.3 Labs (07/10/2015): K 3.9 Creatinine 1.23  Labs (1/18): K 4.1, creatinine 1.18, digoxin 0.3  PMH: 1. H/o complete heart block: Has Medtronic PPM, placed in 2/15.   2. Cardiomyopathy: Echo (2/15) prior to PPM placement with EF 50-55%.  Echo (6/15) with EF 20-25%,  Cardiolite at that time showed possible scar but no ischemia.  Echo (8/16) with EF 15%, diffuse hypokinesis, mildly dilated RV with normal systolic function, PA systolic pressure 69 mmHg.  LHC/RHC (9/16) with mean RA 4, PA 31/15 mean 20, mean PCWP 8, CI  2.09, 40% mLAD stenosis.  Upgrade to MDT CRT-D in 9/16.  - Echo (4/17) with EF 30-35%.   3. CKD 4. 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  5. Atrial tachycardia: paroxysmal. 6. Atrial flutter: s/p ablation in 8/16.  7. OSA 8. Hyperlipidemia 9. Shingles with post-herpetic neuralgia.   FH: Father with CHF, complete heart block with PPM.  Sister with complete heart block and PPM.   SH: Retired Curator, nonsmoker, lives in [place]: All systems reviewed and negative except as per HPI  Current Outpatient Prescriptions  Medication Sig Dispense Refill  . acetaminophen (TYLENOL) 325 MG tablet Take 1-2 tablets (325-650 mg total) by mouth every 4 (four) hours as needed for mild pain.    Marland Kitchen aspirin EC 81 MG tablet Take 1 tablet (81 mg total) by mouth daily. 30 tablet 3  . digoxin (LANOXIN) 0.125 MG tablet Take 1 tablet (125 mcg total) by mouth daily. 30 tablet 5  . furosemide (LASIX) 20 MG tablet Take 1 tablet (20 mg total) by mouth 2 (two) times daily. 60 tablet 5  . sacubitril-valsartan (ENTRESTO) 24-26 MG Take 1 tablet by mouth 2 (two) times daily. 60 tablet 5  . simvastatin (ZOCOR) 40 MG tablet Take 1 tablet (40 mg total) by mouth daily. 30 tablet 1  . spironolactone (ALDACTONE) 25 MG tablet Take 1 tablet (25 mg total) by mouth daily. 30 tablet 5  .  bisoprolol (ZEBETA) 5 MG tablet Take 0.5 tablets (2.5 mg total) by mouth daily. 15 tablet 3  . isosorbide-hydrALAZINE (BIDIL) 20-37.5 MG tablet Take 1 tablet by mouth 3 (three) times daily. 90 tablet 2   No current facility-administered medications for this encounter.    BP 111/75 (BP Location: Right Arm, Patient Position: Sitting, Cuff Size: Normal)   Pulse 80   Wt 216 lb 8 oz (98.2 kg)   SpO2 100%   BMI 28.56 kg/m  General: NAD. Ambulated in the clinic without difficulty.  Neck: No JVD, no thyromegaly or thyroid nodule.  Lungs: Clear  CV: Nondisplaced PMI.  Heart regular S1/S2, no S3/S4, no murmur.  No peripheral edema.  No carotid  bruit.  Normal pedal pulses. L upper chest ICD Abdomen: Soft, nontender, no hepatosplenomegaly, no distention.  Skin: Intact without lesions or rashes.  Neurologic: Alert and oriented x 3.  Psych: Normal affect. Extremities: No clubbing or cyanosis.  HEENT: Normal.   Assessment/Plan: 1. Chronic systolic CHF: Nonischemic cardiomyopathy.  EF 15% on echo in 8/16.  Patient had borderline reduced EF when PPM was placed in 2/15.  After that, EF fell significantly.  He has a history of complete heart block and father had CHF and CHB with pacemaker, and sounds like sister also had CHB with pacemaker. 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  He has had CRT upgrade.  No significant CAD on last cath.  Echo in 4/17 showed improvement in EF to 30-35%.   NYHA I-II. Volume status stable.  - He wants to stop Coreg due to erectile dysfunction.  I will have him stop Coreg and instead take bisoprolol 2.5 mg daily.   - Continue  Entresto 24/26 bid, spironolactone 25 daily, Lasix 20 bid.  Check BMET today.  - Continue digoxin, check level today.  - Simplify regimen by stopping hydralazine/Imdur and starting Bidil 1 tab tid.  - Echo at followup in 3 months.  - Will need to consider genetic evaluation to look for LMNA or SCN5A mutations that could be used for familial screening. He would like to hold off.  2. Atrial flutter: s/p ablation. He is on ASA 81 daily.   3. PE/DVT: >1 year ago and associated with surgery.  No longer anticoagulated.  4. Hyperlipidemia: He has mild CAD.  Continue ASA 81 and statin.   Follow up in 3 months with echo.   Loralie Champagne  03/19/2016

## 2016-03-19 NOTE — Patient Instructions (Signed)
Stop Carvedilol  Stop Hydralazine  Stop Imdur  Start Bisoprolol 2.5 mg (1/2 tab) daily  Start Bidil 1 tab Three times a day   Labs today  Your physician recommends that you schedule a follow-up appointment in: 3 months with echocardiogram

## 2016-03-19 NOTE — Progress Notes (Signed)
Medication Samples have been provided to the patient.  Drug name: Bidil       Strength:         Qty: 2  [id number]:[id number] P1  Exp.Date: 7/18  Dosing instructions: 1 tab Three times a day   The patient has been instructed regarding the correct time, dose, and frequency of taking this medication, including desired effects and most common side effects.   Treylan Mcclintock 2:37 PM 03/19/2016

## 2016-03-21 ENCOUNTER — Telehealth (HOSPITAL_COMMUNITY): Payer: Self-pay | Admitting: Pharmacist

## 2016-03-21 NOTE — Telephone Encounter (Signed)
Philip Chavez called stating his Bidil was very expensive for him at $47/mo. He was able to afford it this month but going forward he will not be able to afford that. Since he has [employer], he can use the Bidil copay card and hopefully not have to spend more than $15/mo. I have advised him to come to clinic before his next refill to pick up the copay card for his next fill. He verbalized understanding and was grateful for the assistance.   [name], PharmD, BCPS, CPP Clinical Pharmacist Pager: [phone] Phone: [phone] 03/21/2016 12:58 PM

## 2016-03-25 MED FILL — ENTRESTO 24 MG-26 MG TABLET: 24-26 | 30 days supply | Qty: 60 | Fill #1

## 2016-04-09 MED FILL — DIGITEK 125 MCG TABLET: 125 | 30 days supply | Qty: 30 | Fill #1

## 2016-04-11 ENCOUNTER — Telehealth: Payer: Self-pay | Admitting: Cardiology

## 2016-04-11 ENCOUNTER — Ambulatory Visit (INDEPENDENT_AMBULATORY_CARE_PROVIDER_SITE_OTHER): Payer: Medicare Other

## 2016-04-11 DIAGNOSIS — Z95810 Presence of automatic (implantable) cardiac defibrillator: Secondary | ICD-10-CM | POA: Diagnosis not present

## 2016-04-11 DIAGNOSIS — I5042 Chronic combined systolic (congestive) and diastolic (congestive) heart failure: Secondary | ICD-10-CM

## 2016-04-11 NOTE — Telephone Encounter (Signed)
Spoke with pt and reminded pt of remote transmission that is due today. Pt verbalized understanding.   

## 2016-04-11 NOTE — Progress Notes (Signed)
EPIC Encounter for ICM Monitoring  Patient Name: Philip Chavez is a 66 y.o. male Date: 04/11/2016 Primary Care Physican: Junie Panning, DO Primary [place] Electrophysiologist: Lovena Le Dry Weight:206lb  Bi-V Pacing: 98.3%         Heart Failure questions reviewed, pt asymptomatic.   Thoracic impedance normal.  Prescribed and confirmed dosage: Furosemide 20 mg 1 tablet bid  Recommendations: No changes. Reminded to limit dietary salt intake to 2000 mg/day and fluid intake to < 2 liters/day. Encouraged to call for fluid symptoms.  Follow-up plan: ICM clinic phone appointment on 05/14/2016.  Copy of ICM check sent to device physician.   3 month ICM trend: 04/11/2016       Rosalene Billings, RN 04/11/2016 11:58 AM

## 2016-04-15 ENCOUNTER — Ambulatory Visit (INDEPENDENT_AMBULATORY_CARE_PROVIDER_SITE_OTHER): Payer: Medicare Other | Admitting: *Deleted

## 2016-04-15 DIAGNOSIS — I428 Other cardiomyopathies: Secondary | ICD-10-CM | POA: Diagnosis not present

## 2016-04-15 MED FILL — SPIRONOLACTONE 25 MG TABLET: 25 | 30 days supply | Qty: 30 | Fill #3

## 2016-04-15 MED FILL — BISOPROLOL FUMARATE 5 MG TA: 5 | 30 days supply | Qty: 15 | Fill #1

## 2016-04-16 NOTE — Progress Notes (Signed)
Remote ICD transmission.   

## 2016-04-17 ENCOUNTER — Encounter: Payer: Self-pay | Admitting: Cardiology

## 2016-04-17 LAB — CUP PACEART REMOTE DEVICE CHECK
Battery Voltage: 2.96 V
Brady Statistic AP VP Percent: 3.61 %
Brady Statistic AS VP Percent: 95.4 %
Brady Statistic RV Percent Paced: 98.27 %
Date Time Interrogation Session: 20180308165628
HIGH POWER IMPEDANCE MEASURED VALUE: 54 Ohm
Implantable Lead Implant Date: 20150209
Implantable Lead Implant Date: 20160919
Implantable Lead Location: 753858
Implantable Lead Location: 753859
Implantable Lead Location: 753860
Implantable Lead Model: 4598
Implantable Lead Model: 5076
Lead Channel Impedance Value: 304 Ohm
Lead Channel Impedance Value: 304 Ohm
Lead Channel Impedance Value: 323 Ohm
Lead Channel Impedance Value: 380 Ohm
Lead Channel Impedance Value: 380 Ohm
Lead Channel Impedance Value: 437 Ohm
Lead Channel Impedance Value: 513 Ohm
Lead Channel Impedance Value: 570 Ohm
Lead Channel Impedance Value: 570 Ohm
Lead Channel Pacing Threshold Amplitude: 0.875 V
Lead Channel Pacing Threshold Amplitude: 0.875 V
Lead Channel Pacing Threshold Amplitude: 6 V
Lead Channel Pacing Threshold Pulse Width: 0.4 ms
Lead Channel Pacing Threshold Pulse Width: 0.4 ms
Lead Channel Sensing Intrinsic Amplitude: 12.375 mV
Lead Channel Sensing Intrinsic Amplitude: 2.375 mV
Lead Channel Setting Pacing Amplitude: 2 V
Lead Channel Setting Pacing Amplitude: 3 V
Lead Channel Setting Pacing Pulse Width: 0.4 ms
Lead Channel Setting Sensing Sensitivity: 0.3 mV
MDC IDC LEAD IMPLANT DT: 20160919
MDC IDC MSMT BATTERY REMAINING LONGEVITY: 57 mo
MDC IDC MSMT LEADCHNL LV IMPEDANCE VALUE: 304 Ohm
MDC IDC MSMT LEADCHNL LV IMPEDANCE VALUE: 399 Ohm
MDC IDC MSMT LEADCHNL LV IMPEDANCE VALUE: 513 Ohm
MDC IDC MSMT LEADCHNL LV IMPEDANCE VALUE: 570 Ohm
MDC IDC MSMT LEADCHNL RA PACING THRESHOLD PULSEWIDTH: 0.4 ms
MDC IDC MSMT LEADCHNL RA SENSING INTR AMPL: 2.375 mV
MDC IDC MSMT LEADCHNL RV SENSING INTR AMPL: 12.375 mV
MDC IDC PG IMPLANT DT: 20160919
MDC IDC SET LEADCHNL LV PACING PULSEWIDTH: 0.4 ms
MDC IDC SET LEADCHNL RA PACING AMPLITUDE: 2 V
MDC IDC STAT BRADY AP VS PERCENT: 0.01 %
MDC IDC STAT BRADY AS VS PERCENT: 0.98 %
MDC IDC STAT BRADY RA PERCENT PACED: 3.54 %

## 2016-04-22 MED FILL — ENTRESTO 24 MG-26 MG TABLET: 24-26 | 30 days supply | Qty: 60 | Fill #2

## 2016-04-25 ENCOUNTER — Telehealth: Payer: Self-pay | Admitting: *Deleted

## 2016-04-25 MED FILL — BIDIL TABLET: 20-37.5 | 30 days supply | Qty: 90 | Fill #1

## 2016-04-25 NOTE — Telephone Encounter (Signed)
-----   Message from Evans Lance, MD sent at 04/24/2016  8:49 PM EDT ----- I need to see him back to discuss atrial fib and elevated thresholds.

## 2016-04-25 NOTE — Telephone Encounter (Signed)
Informed patient that Dr.Taylor wants him to come into the office to discuss AF episode and to evaluate LV threshold. Patient voiced understanding.  Will defer scheduling to [facility].

## 2016-04-30 ENCOUNTER — Ambulatory Visit
Admission: RE | Admit: 2016-04-30 | Discharge: 2016-04-30 | Disposition: A | Discharge status: Home / Self Care | Payer: Medicare Other | Source: Ambulatory Visit | Attending: Internal Medicine | Admitting: Internal Medicine

## 2016-04-30 ENCOUNTER — Ambulatory Visit (INDEPENDENT_AMBULATORY_CARE_PROVIDER_SITE_OTHER): Payer: Medicare Other | Admitting: Internal Medicine

## 2016-04-30 ENCOUNTER — Encounter: Payer: Self-pay | Admitting: Internal Medicine

## 2016-04-30 VITALS — BP 90/66 | HR 85 | Ht 73.0 in | Wt 213.0 lb

## 2016-04-30 DIAGNOSIS — I5042 Chronic combined systolic (congestive) and diastolic (congestive) heart failure: Secondary | ICD-10-CM

## 2016-04-30 DIAGNOSIS — Z4502 Encounter for adjustment and management of automatic implantable cardiac defibrillator: Secondary | ICD-10-CM | POA: Diagnosis not present

## 2016-04-30 DIAGNOSIS — T82120A Displacement of cardiac electrode, initial encounter: Secondary | ICD-10-CM

## 2016-04-30 DIAGNOSIS — T82121A Displacement of cardiac pulse generator (battery), initial encounter: Secondary | ICD-10-CM | POA: Diagnosis not present

## 2016-04-30 NOTE — Progress Notes (Signed)
HPI Philip Chavez returns today for followup. He is a 66 yo man with a h/o complete heart block and HTN, s/p PPM insertion. He has developed LV dysfunction. He had atrial flutter and underwent ablation. He has not had syncope. His EF has been less than 30% for over a year. He has been on maximal medical therapy. His heart failure is class 2. 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 He has undergone BiV ICD implantation. Since I saw him last he feels well with no chest pain, sob, or edema. No ICD shocks. His LV lead has developed an increasing threshold.  No Known Allergies   Current Outpatient Prescriptions  Medication Sig Dispense Refill  . acetaminophen (TYLENOL) 325 MG tablet Take 1-2 tablets (325-650 mg total) by mouth every 4 (four) hours as needed for mild pain.    Marland Kitchen aspirin EC 81 MG tablet Take 1 tablet (81 mg total) by mouth daily. 30 tablet 3  . bisoprolol (ZEBETA) 5 MG tablet Take 0.5 tablets (2.5 mg total) by mouth daily. 15 tablet 3  . digoxin (LANOXIN) 0.125 MG tablet Take 1 tablet (125 mcg total) by mouth daily. 30 tablet 5  . furosemide (LASIX) 20 MG tablet Take 1 tablet (20 mg total) by mouth 2 (two) times daily. 60 tablet 5  . isosorbide-hydrALAZINE (BIDIL) 20-37.5 MG tablet Take 1 tablet by mouth 3 (three) times daily. 90 tablet 2  . sacubitril-valsartan (ENTRESTO) 24-26 MG Take 1 tablet by mouth 2 (two) times daily. 60 tablet 5  . simvastatin (ZOCOR) 40 MG tablet Take 1 tablet (40 mg total) by mouth daily. 30 tablet 1  . spironolactone (ALDACTONE) 25 MG tablet Take 1 tablet (25 mg total) by mouth daily. 30 tablet 5   No current facility-administered medications for this visit.      Past Medical History:  Diagnosis Date  . AICD (automatic cardioverter/defibrillator) present 0919/2016   BIV   . Atrial flutter ([place])    a. 08/2014 presented w/ aflutter->converted on amio;  b. CHA2DS2VASc = 2 -->Xarelto.  . Atrial tachycardia ([place])   . Chronic  combined systolic and diastolic CHF (congestive heart failure) ([place])    a. 03/2013 Echo EF 50-55%;  b. 07/2013 Echo EF 20-25%, diff HK, Gr3 DD;  c. 08/2014 Echo: EF 15%, diff HK, mild AI/MR, mildly dil LA/RV, mod TR, PASP 40mmHg.  Marland Kitchen DVT (deep venous thrombosis) ([place])    a. 2007 RLE: S/P ankle surgery;  b. 03/2013 LLE DVT & PE-->Xarelto.  . Embolism, pulmonary with infarction ([place])    a. 2007 RLE: S/P ankle surgery;  b. 03/2013 LLE DVT & PE-->Xarelto.  . Hypercholesterolemia   . Hypercoagulable state ([place])   . Hypertension   . Musculoskeletal neck pain   . Nonischemic cardiomyopathy ([place])    a. 07/2013 Echo: EF 20-25%;  b. 07/2013 Myoview: Large inferior, lateral, apical scar w/ HK, no ischemia, EF 17%;  b. 08/2014 Echo: EF 15%, diff HK.  Marland Kitchen Pneumonia ~ 04/2014; 09/02/2014  . Presence of permanent cardiac pacemaker    a. 03/2013 s/p MDT ADDRL1 Adapta DC PPM, ser # [id number] H.  . Sleep apnea    does not wear mask (09/02/2014)  . Third degree heart block ([place])    a. 03/2013 s/p MDT ADDRL1 Adapta DC PPM, ser # [id number] H.    ROS:   All systems reviewed and negative except as noted in the HPI.   Past Surgical History:  Procedure Laterality  Date  . CARDIAC CATHETERIZATION N/A 10/17/2014   Procedure: Right/Left Heart Cath and Coronary Angiography;  Surgeon: Larey Dresser, MD;  Location: [place];  Service: Cardiovascular;  Laterality: N/A;  . ELECTROPHYSIOLOGIC STUDY N/A 09/07/2014   Procedure: A-Flutter;  Surgeon: Evans Lance, MD;  Location: [place] [facility];  Service: Cardiovascular;  Laterality: N/A;  . EP IMPLANTABLE DEVICE  10/24/2014   BIV  . EP IMPLANTABLE DEVICE N/A 10/24/2014   Procedure: BiV ICD Upgrade;  Surgeon: Evans Lance, MD;  Location: [place] [facility];  Service: Cardiovascular;  Laterality: N/A;  . FOOT FRACTURE SURGERY Right 2007  . FRACTURE SURGERY    . INGUINAL HERNIA REPAIR Right 1980's  . INSERT / REPLACE / REMOVE PACEMAKER     MDT ADDRL1 pacemaker implanted  by Dr Lovena Le for complete heart block  . PERMANENT PACEMAKER INSERTION N/A 03/15/2013   Procedure: PERMANENT PACEMAKER INSERTION;  Surgeon: Evans Lance, MD;  Location: [facility] C[facility];  Service: Cardiovascular;  Laterality: N/A;     Family History  Problem Relation Age of Onset  . Diabetes type II Mother   . Heart disease Father   . Hypertension Neg Hx   . Heart attack Neg Hx   . Arrhythmia Father   . Arrhythmia Sister   . Stroke Neg Hx      Social History   Social History  . Marital status: Married    Spouse name: N/A  . Number of children: N/A  . Years of education: N/A   Occupational History  . Not on file.   Social History Main Topics  . Smoking status: Former Smoker    Packs/day: 1.50    Years: 10.00    Types: Cigarettes  . Smokeless tobacco: Never Used     Comment: 09/02/2014  "quit smoking 20-30 yr ago"  . Alcohol use No  . Drug use: No  . Sexual activity: Yes   Other Topics Concern  . Not on file   Social History Narrative  . No narrative on file     BP 90/66   Pulse 85   Ht 6\' 1"  (1.854 m)   Wt 213 lb (96.6 kg)   BMI 28.10 kg/m   Physical Exam:  Well appearing middle aged man,NAD HEENT: Unremarkable Neck:  7 cm JVD, no thyromegally Back:  No CVA tenderness Lungs:  Clear with no wheezes, well healed PPM incision. HEART:  Regular rate rhythm, no murmurs, no rubs, no clicks, soft S3., LV heave Abd:  soft, positive bowel sounds, no organomegally, no rebound, no guarding Ext:  2 plus pulses, no edema, no cyanosis, no clubbing Skin:  No rashes no nodules Neuro:  CN II through XII intact, motor grossly intact   DEVICE  His LV lead is not working correctly and will not capture in all pacing configurations.   Assess/Plan:  1. Chronic systolic heart failure -his symptoms are class 2. He will continue his current meds. He admits to dietary indiscretion and I have asked him to take an extra lasix when he eats a salty meal. 2. ICD - his medtronic Biv  ICD has been reprogrammed. We will ask him to obtain a CXR to see why he is not capturing in any configuration. 3. HTN - his blood pressure is well controlled. He denies medical non-compliance.  Will follow. 4. Complete heart block - he has minimal conduction today. Will follow.  [name].D.

## 2016-04-30 NOTE — Patient Instructions (Signed)
Medication Instructions:    Your physician recommends that you continue on your current medications as directed. Please refer to the Current Medication list given to you today.  --- If you need a refill on your cardiac medications before your next appointment, please call your pharmacy. ---  Labwork:  None ordered  Testing/Procedures:  A chest x-ray takes a picture of the organs and structures inside the chest, including the heart, lungs, and blood vessels. This test can show several things, including, whether the heart is enlarges; whether fluid is building up in the lungs; and whether pacemaker / defibrillator leads are still in place.  Follow-Up: Remote monitoring is used to monitor your Pacemaker of ICD from home. This monitoring reduces the number of office visits required to check your device to one time per year. It allows [place] to keep an eye on the functioning of your device to ensure it is working properly. You are scheduled for a device check from home on 07/30/2016. You may send your transmission at any time that day. If you have a wireless device, the transmission will be sent automatically. After your physician reviews your transmission, you will receive a postcard with your next transmission date.   Follow up with Dr. Lovena Le to be determined after reviewing chest x-ray.  Thank you for choosing [facility]!!

## 2016-05-02 LAB — CUP PACEART INCLINIC DEVICE CHECK
Date Time Interrogation Session: 20180329114453
Implantable Lead Implant Date: 20160919
Implantable Lead Location: 753860
Implantable Lead Model: 4598
Lead Channel Pacing Threshold Amplitude: 1.25 V
Lead Channel Pacing Threshold Amplitude: 1.5 V
Lead Channel Pacing Threshold Pulse Width: 0.4 ms
Lead Channel Sensing Intrinsic Amplitude: 14.1 mV
Lead Channel Sensing Intrinsic Amplitude: 2.6 mV
MDC IDC LEAD IMPLANT DT: 20150209
MDC IDC LEAD IMPLANT DT: 20160919
MDC IDC LEAD LOCATION: 753858
MDC IDC LEAD LOCATION: 753859
MDC IDC MSMT LEADCHNL RV PACING THRESHOLD PULSEWIDTH: 0.4 ms
MDC IDC PG IMPLANT DT: 20160919

## 2016-05-09 MED FILL — DIGITEK 125 MCG TABLET: 125 | 30 days supply | Qty: 30 | Fill #2

## 2016-05-13 MED FILL — SPIRONOLACTONE 25 MG TABLET: 25 | 30 days supply | Qty: 30 | Fill #4

## 2016-05-13 MED FILL — BISOPROLOL FUMARATE 5 MG TA: 5 | 30 days supply | Qty: 15 | Fill #2

## 2016-05-14 ENCOUNTER — Telehealth: Payer: Self-pay

## 2016-05-14 ENCOUNTER — Ambulatory Visit (INDEPENDENT_AMBULATORY_CARE_PROVIDER_SITE_OTHER): Payer: Medicare Other

## 2016-05-14 ENCOUNTER — Telehealth: Payer: Self-pay | Admitting: Cardiology

## 2016-05-14 DIAGNOSIS — I5042 Chronic combined systolic (congestive) and diastolic (congestive) heart failure: Secondary | ICD-10-CM | POA: Diagnosis not present

## 2016-05-14 DIAGNOSIS — I5022 Chronic systolic (congestive) heart failure: Secondary | ICD-10-CM

## 2016-05-14 DIAGNOSIS — Z95810 Presence of automatic (implantable) cardiac defibrillator: Secondary | ICD-10-CM

## 2016-05-14 NOTE — Telephone Encounter (Signed)
Remote ICM transmission received.  Attempted patient call and left message to return call.   

## 2016-05-14 NOTE — Telephone Encounter (Signed)
Spoke with pt and reminded pt of remote transmission that is due today. Pt verbalized understanding.   

## 2016-05-14 NOTE — Progress Notes (Signed)
EPIC Encounter for ICM Monitoring  Patient Name: Philip Chavez is a 66 y.o. male Date: 05/14/2016 Primary Care Physican: Junie Panning, DO Primary [place] Electrophysiologist: Lovena Le Dry Weight: Last known weight206lb  Bi-V Pacing: 96.6%      Attempted call to patient and unable to reach.  Left message to return call.  Transmission reviewed.    Thoracic impedance normal.  Prescribed dosage: Furosemide 20 mg 1 tablet bid  Labs: 03/19/2016 Creatinine 1.24, BUN 13, Potassium 4.0, Sodium 135, EGFR 59->60 02/19/2016 Creatinine 1.18, BUN 15, Potassium 4.1, Sodium 136, EGFR >60  12/19/2015 Creatinine 1.32, BUN 12, Potassium 4.1, Sodium 136, EGFR 55->60  07/10/2015 Creatinine 1.23, BUN 11, Potassium 3.9, Sodium 137, EGFR >60   Recommendations: NONE - Unable to reach patient   Follow-up plan: ICM clinic phone appointment on 06/28/2016.  Office appointment scheduled on 06/18/2016 with HF Clinic and office defib check with Dr Lovena Le 07/30/2016.  Copy of ICM check sent to device physician.   3 month ICM trend: 05/14/2016   1 Year ICM trend:      Rosalene Billings, RN 05/14/2016 12:04 PM

## 2016-05-23 MED FILL — ENTRESTO 24 MG-26 MG TABLET: 24-26 | 30 days supply | Qty: 60 | Fill #3

## 2016-05-24 ENCOUNTER — Other Ambulatory Visit: Payer: Self-pay | Admitting: Family Medicine

## 2016-05-27 MED FILL — BIDIL TABLET: 20-37.5 | 30 days supply | Qty: 90 | Fill #2

## 2016-05-31 ENCOUNTER — Encounter: Payer: Self-pay | Admitting: Internal Medicine

## 2016-06-04 ENCOUNTER — Ambulatory Visit (INDEPENDENT_AMBULATORY_CARE_PROVIDER_SITE_OTHER): Payer: Medicare Other | Admitting: Internal Medicine

## 2016-06-04 ENCOUNTER — Encounter: Payer: Self-pay | Admitting: Internal Medicine

## 2016-06-04 ENCOUNTER — Encounter (INDEPENDENT_AMBULATORY_CARE_PROVIDER_SITE_OTHER): Payer: Self-pay

## 2016-06-04 VITALS — BP 100/54 | HR 90 | Ht 73.0 in | Wt 214.6 lb

## 2016-06-04 DIAGNOSIS — Z95810 Presence of automatic (implantable) cardiac defibrillator: Secondary | ICD-10-CM | POA: Diagnosis not present

## 2016-06-04 DIAGNOSIS — I5042 Chronic combined systolic (congestive) and diastolic (congestive) heart failure: Secondary | ICD-10-CM

## 2016-06-04 NOTE — Progress Notes (Signed)
HPI [name] returns today for followup. He is a 66 yo man with a h/o complete heart block and HTN, s/p PPM insertion. He has developed LV dysfunction. He had atrial flutter and underwent ablation. He has not had syncope. His EF has been less than 30% for over a year. He has been on maximal medical therapy. His heart failure is class 2A. 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 He has undergone BiV ICD implantation. He was noted to have worsening LV thresholds and found to have dislodgement of his LV lead back into the CS. He feels well. I have shown him his CXR. No Known Allergies   Current Outpatient Prescriptions  Medication Sig Dispense Refill  . acetaminophen (TYLENOL) 325 MG tablet Take 1-2 tablets (325-650 mg total) by mouth every 4 (four) hours as needed for mild pain.    Marland Kitchen aspirin EC 81 MG tablet Take 1 tablet (81 mg total) by mouth daily. 30 tablet 3  . bisoprolol (ZEBETA) 5 MG tablet Take 0.5 tablets (2.5 mg total) by mouth daily. 15 tablet 3  . digoxin (LANOXIN) 0.125 MG tablet Take 1 tablet (125 mcg total) by mouth daily. 30 tablet 5  . furosemide (LASIX) 20 MG tablet Take 1 tablet (20 mg total) by mouth 2 (two) times daily. 60 tablet 5  . isosorbide-hydrALAZINE (BIDIL) 20-37.5 MG tablet Take 1 tablet by mouth 3 (three) times daily. 90 tablet 2  . sacubitril-valsartan (ENTRESTO) 24-26 MG Take 1 tablet by mouth 2 (two) times daily. 60 tablet 5  . simvastatin (ZOCOR) 40 MG tablet Take 1 tablet (40 mg total) by mouth daily. 30 tablet 1  . spironolactone (ALDACTONE) 25 MG tablet Take 1 tablet (25 mg total) by mouth daily. 30 tablet 5   No current facility-administered medications for this visit.      Past Medical History:  Diagnosis Date  . AICD (automatic cardioverter/defibrillator) present 0919/2016   BIV   . Atrial flutter ([place])    a. 08/2014 presented w/ aflutter->converted on amio;  b. CHA2DS2VASc = 2 -->Xarelto.  . Atrial tachycardia ([place])   .  Chronic combined systolic and diastolic CHF (congestive heart failure) ([place])    a. 03/2013 Echo EF 50-55%;  b. 07/2013 Echo EF 20-25%, diff HK, Gr3 DD;  c. 08/2014 Echo: EF 15%, diff HK, mild AI/MR, mildly dil LA/RV, mod TR, PASP 26mmHg.  Marland Kitchen DVT (deep venous thrombosis) ([place])    a. 2007 RLE: S/P ankle surgery;  b. 03/2013 LLE DVT & PE-->Xarelto.  . Embolism, pulmonary with infarction ([place])    a. 2007 RLE: S/P ankle surgery;  b. 03/2013 LLE DVT & PE-->Xarelto.  . Hypercholesterolemia   . Hypercoagulable state ([place])   . Hypertension   . Musculoskeletal neck pain   . Nonischemic cardiomyopathy ([place])    a. 07/2013 Echo: EF 20-25%;  b. 07/2013 Myoview: Large inferior, lateral, apical scar w/ HK, no ischemia, EF 17%;  b. 08/2014 Echo: EF 15%, diff HK.  Marland Kitchen Pneumonia ~ 04/2014; 09/02/2014  . Presence of permanent cardiac pacemaker    a. 03/2013 s/p MDT ADDRL1 Adapta DC PPM, ser # [id number] H.  . Sleep apnea    does not wear mask (09/02/2014)  . Third degree heart block ([place])    a. 03/2013 s/p MDT ADDRL1 Adapta DC PPM, ser # [id number] H.    ROS:   All systems reviewed and negative except as noted in the HPI.   Past Surgical History:  Procedure Laterality Date  . CARDIAC CATHETERIZATION N/A 10/17/2014   Procedure: Right/Left Heart Cath and Coronary Angiography;  Surgeon: Larey Dresser, MD;  Location: [place] [facility];  Service: Cardiovascular;  Laterality: N/A;  . ELECTROPHYSIOLOGIC STUDY N/A 09/07/2014   Procedure: A-Flutter;  Surgeon: Evans Lance, MD;  Location: [place] [facility];  Service: Cardiovascular;  Laterality: N/A;  . EP IMPLANTABLE DEVICE  10/24/2014   BIV  . EP IMPLANTABLE DEVICE N/A 10/24/2014   Procedure: BiV ICD Upgrade;  Surgeon: Evans Lance, MD;  Location: [place] [facility];  Service: Cardiovascular;  Laterality: N/A;  . FOOT FRACTURE SURGERY Right 2007  . FRACTURE SURGERY    . INGUINAL HERNIA REPAIR Right 1980's  . INSERT / REPLACE / REMOVE PACEMAKER     MDT ADDRL1 pacemaker  implanted by Dr Lovena Le for complete heart block  . PERMANENT PACEMAKER INSERTION N/A 03/15/2013   Procedure: PERMANENT PACEMAKER INSERTION;  Surgeon: Evans Lance, MD;  [facility] CATH LAB;  Service: Cardiovascular;  Laterality: N/A;     Family History  Problem Relation Age of Onset  . Diabetes type II Mother   . Heart disease Father   . Arrhythmia Father   . Arrhythmia Sister   . Hypertension Neg Hx   . Heart attack Neg Hx   . Stroke Neg Hx      Social History   Social History  . Marital status: Married    Spouse name: N/A  . Number of children: N/A  . Years of education: N/A   Occupational History  . Not on file.   Social History Main Topics  . Smoking status: Former Smoker    Packs/day: 1.50    Years: 10.00    Types: Cigarettes  . Smokeless tobacco: Never Used     Comment: 09/02/2014  "quit smoking 20-30 yr ago"  . Alcohol use No  . Drug use: No  . Sexual activity: Yes   Other Topics Concern  . Not on file   Social History Narrative  . No narrative on file     BP (!) 100/54 (BP Location: Left Arm)   Pulse 90   Ht 6\' 1"  (1.854 m)   Wt 214 lb 9.6 oz (97.3 kg)   BMI 28.31 kg/m   Physical Exam:  Well appearing middle aged man,NAD HEENT: Unremarkable Neck:  7 cm JVD, no thyromegally Back:  No CVA tenderness Lungs:  Clear with no wheezes, well healed PPM incision. HEART:  Regular rate rhythm, no murmurs, no rubs, no clicks, soft S3., LV heave Abd:  soft, positive bowel sounds, no organomegally, no rebound, no guarding Ext:  2 plus pulses, no edema, no cyanosis, no clubbing Skin:  No rashes no nodules Neuro:  CN II through XII intact, motor grossly intact   DEVICE  His LV lead is not working correctly and will not capture in all pacing configurations.   Assess/Plan:  1. Chronic systolic heart failure -his symptoms are class 2A, bordering on class 1. He will continue his current meds. He admits to dietary indiscretion and I have asked him to take  an extra lasix when he eats a salty meal. I have offered him LV lead revision but because he feels well, he prefers to undergo watchful waiting and only undergo revision if he develops worsening SOB.  2. ICD - his medtronic Biv ICD has been reprogrammed so that he is no longer LV pacing.  3. HTN - his blood pressure is well  controlled. He denies medical non-compliance.  Will follow.   [name].D.

## 2016-06-04 NOTE — Patient Instructions (Signed)

## 2016-06-08 ENCOUNTER — Other Ambulatory Visit (HOSPITAL_COMMUNITY): Payer: Self-pay | Admitting: Cardiology

## 2016-06-08 DIAGNOSIS — I5022 Chronic systolic (congestive) heart failure: Secondary | ICD-10-CM

## 2016-06-11 MED FILL — DIGOXIN 0.125 MG TABLET: 125 | 30 days supply | Qty: 30 | Fill #3

## 2016-06-12 MED FILL — BISOPROLOL FUMARATE 5 MG TA: 5 | 30 days supply | Qty: 15 | Fill #3

## 2016-06-18 ENCOUNTER — Ambulatory Visit (HOSPITAL_COMMUNITY)
Admission: RE | Admit: 2016-06-18 | Discharge: 2016-06-18 | Disposition: A | Discharge status: Home / Self Care | Payer: Medicare Other | Source: Ambulatory Visit | Attending: Cardiology | Admitting: Cardiology

## 2016-06-18 ENCOUNTER — Encounter (HOSPITAL_COMMUNITY): Payer: Self-pay

## 2016-06-18 ENCOUNTER — Ambulatory Visit (HOSPITAL_BASED_OUTPATIENT_CLINIC_OR_DEPARTMENT_OTHER)
Admission: RE | Admit: 2016-06-18 | Discharge: 2016-06-18 | Disposition: A | Discharge status: Home / Self Care | Payer: Medicare Other | Source: Ambulatory Visit | Attending: Cardiology | Admitting: Cardiology

## 2016-06-18 VITALS — BP 111/67 | HR 62 | Wt 213.0 lb

## 2016-06-18 DIAGNOSIS — I5030 Unspecified diastolic (congestive) heart failure: Secondary | ICD-10-CM | POA: Diagnosis not present

## 2016-06-18 DIAGNOSIS — I2510 Atherosclerotic heart disease of native coronary artery without angina pectoris: Secondary | ICD-10-CM | POA: Diagnosis not present

## 2016-06-18 DIAGNOSIS — I429 Cardiomyopathy, unspecified: Secondary | ICD-10-CM | POA: Diagnosis not present

## 2016-06-18 DIAGNOSIS — I442 Atrioventricular block, complete: Secondary | ICD-10-CM | POA: Insufficient documentation

## 2016-06-18 DIAGNOSIS — Z7982 Long term (current) use of aspirin: Secondary | ICD-10-CM | POA: Insufficient documentation

## 2016-06-18 DIAGNOSIS — I5022 Chronic systolic (congestive) heart failure: Secondary | ICD-10-CM | POA: Diagnosis not present

## 2016-06-18 DIAGNOSIS — Z7902 Long term (current) use of antithrombotics/antiplatelets: Secondary | ICD-10-CM | POA: Diagnosis not present

## 2016-06-18 DIAGNOSIS — Z86718 Personal history of other venous thrombosis and embolism: Secondary | ICD-10-CM | POA: Insufficient documentation

## 2016-06-18 DIAGNOSIS — I340 Nonrheumatic mitral (valve) insufficiency: Secondary | ICD-10-CM | POA: Diagnosis not present

## 2016-06-18 DIAGNOSIS — I351 Nonrheumatic aortic (valve) insufficiency: Secondary | ICD-10-CM | POA: Diagnosis not present

## 2016-06-18 DIAGNOSIS — I420 Dilated cardiomyopathy: Secondary | ICD-10-CM | POA: Insufficient documentation

## 2016-06-18 DIAGNOSIS — Z9889 Other specified postprocedural states: Secondary | ICD-10-CM | POA: Insufficient documentation

## 2016-06-18 DIAGNOSIS — E785 Hyperlipidemia, unspecified: Secondary | ICD-10-CM | POA: Diagnosis not present

## 2016-06-18 DIAGNOSIS — I480 Paroxysmal atrial fibrillation: Secondary | ICD-10-CM | POA: Diagnosis not present

## 2016-06-18 DIAGNOSIS — I5042 Chronic combined systolic (congestive) and diastolic (congestive) heart failure: Secondary | ICD-10-CM

## 2016-06-18 DIAGNOSIS — I4891 Unspecified atrial fibrillation: Secondary | ICD-10-CM | POA: Diagnosis not present

## 2016-06-18 DIAGNOSIS — N189 Chronic kidney disease, unspecified: Secondary | ICD-10-CM | POA: Insufficient documentation

## 2016-06-18 DIAGNOSIS — I4892 Unspecified atrial flutter: Secondary | ICD-10-CM | POA: Diagnosis not present

## 2016-06-18 DIAGNOSIS — Z95810 Presence of automatic (implantable) cardiac defibrillator: Secondary | ICD-10-CM | POA: Insufficient documentation

## 2016-06-18 DIAGNOSIS — Z8249 Family history of ischemic heart disease and other diseases of the circulatory system: Secondary | ICD-10-CM | POA: Diagnosis not present

## 2016-06-18 DIAGNOSIS — G4733 Obstructive sleep apnea (adult) (pediatric): Secondary | ICD-10-CM | POA: Insufficient documentation

## 2016-06-18 DIAGNOSIS — I361 Nonrheumatic tricuspid (valve) insufficiency: Secondary | ICD-10-CM | POA: Diagnosis not present

## 2016-06-18 DIAGNOSIS — B0229 Other postherpetic nervous system involvement: Secondary | ICD-10-CM | POA: Insufficient documentation

## 2016-06-18 LAB — CBC
HCT: 44.4 % (ref 39.0–52.0)
Hemoglobin: 14.8 g/dL (ref 13.0–17.0)
MCH: 31.3 pg (ref 26.0–34.0)
MCHC: 33.3 g/dL (ref 30.0–36.0)
MCV: 93.9 fL (ref 78.0–100.0)
PLATELETS: 161 10*3/uL (ref 150–400)
RBC: 4.73 MIL/uL (ref 4.22–5.81)
RDW: 14.2 % (ref 11.5–15.5)
WBC: 4.9 10*3/uL (ref 4.0–10.5)

## 2016-06-18 LAB — BASIC METABOLIC PANEL
ANION GAP: 6 (ref 5–15)
BUN: 12 mg/dL (ref 6–20)
CALCIUM: 9.3 mg/dL (ref 8.9–10.3)
CO2: 26 mmol/L (ref 22–32)
Chloride: 105 mmol/L (ref 101–111)
Creatinine, Ser: 1.25 mg/dL — ABNORMAL HIGH (ref 0.61–1.24)
GFR, EST NON AFRICAN AMERICAN: 59 mL/min — AB (ref >60)
GLUCOSE: 98 mg/dL (ref 65–99)
POTASSIUM: 4 mmol/L (ref 3.5–5.1)
Sodium: 137 mmol/L (ref 135–145)

## 2016-06-18 LAB — DIGOXIN LEVEL: DIGOXIN LVL: 0.5 ng/mL — AB (ref 0.8–2.0)

## 2016-06-18 MED ORDER — BISOPROLOL FUMARATE 5 MG PO TABS: 5.0000 mg | ORAL_TABLET | Freq: Every day | ORAL | 6 refills | Status: DC

## 2016-06-18 MED ORDER — APIXABAN 5 MG PO TABS: 5.0000 mg | ORAL_TABLET | Freq: Two times a day (BID) | ORAL | 6 refills | Status: DC

## 2016-06-18 MED FILL — ELIQUIS 5 MG TABLET: 5 | 30 days supply | Qty: 60 | Fill #0

## 2016-06-18 NOTE — Progress Notes (Signed)
Patient ID: Philip Chavez, male   DOB: [birth year], 66 y.o.   MRN: [id number] PCP: Dr. Gerlean Ren EP: Dr. Lovena Le HF cardiologist: Dr Aundra Dubin  66 yo with history of complete heart block/Medtronic PPM, cardiomyopathy, and atrial flutter s/p ablation presents for CHF clinic evaluation.  Patient developed complete heart block in 2/15 and had PPM placed at that time.  He paces his RV continuously.  In 2/15, EF was 50-55% by echo.  By 6/15, EF had fallen to 20-25%.  Cardiolite showed possible scar but no ischemia.  On 09/03/14, he was admitted with dyspnea and found to be in atrial flutter with RLL PNA and with volume overload.  Echo showed EF 15% with diffuse hypokinesis.  He was started on IV Lasix and IV amiodarone. He converted back to NSR.  He subsequently had atrial flutter ablation.  He had right and left heart cath in 9/16, showing nonobstructive CAD and optimized filling pressures with CI 2.09.  He subsequently had upgrade to CRT by Dr Lovena Le.  EF up to 30-35% in 4/17.   He returns today for HF follow up. Overall feeling well, not SOB with walking into clinic. Weight stable. Walks daily around the neighborhood without SOB. Denies orthopnea, PND. Taking all medications with compliance. Denies orthostasis. Eating a low salt diet, drinking less than 2L a day. Denies palpitations and lower extremity edema. Saw Dr. Lovena Le at the beginning of the month, noted that LV lead dislodged. He was offered lead revision, but preferred watchful waiting. Today, echo was done.  EF is down to 20%.    Medtronic device interrogated: Fluid index/impedance stable.  Atrial fibrillation noted, episodes up to 10 hours though not frequent. He is no longer BiV pacing.    Labs (8/16): K 3.9, creatinine 1.03, AST 106, ALT 173, HCT 45.1, LDL 132 Labs (9/16): K 3.9, creatinine 1.16, HCT 42.5 Labs (10/16): K 3.9, creatinine 1.32, HCT 39.9 Labs (2/17): K 4.1, creatinine 1.26, BNP 107, digoxin 0.3 Labs (07/10/2015): K 3.9 Creatinine 1.23   Labs (1/18): K 4.1, creatinine 1.18, digoxin 0.3 Labs (2/18): K 4, creatinine 1.24, digoxin 0.4  PMH: 1. H/o complete heart block: Has Medtronic PPM, placed in 2/15.   2. Cardiomyopathy: Echo (2/15) prior to PPM placement with EF 50-55%.  Echo (6/15) with EF 20-25%,  Cardiolite at that time showed possible scar but no ischemia.  Echo (8/16) with EF 15%, diffuse hypokinesis, mildly dilated RV with normal systolic function, PA systolic pressure 69 mmHg.  LHC/RHC (9/16) with mean RA 4, PA 31/15 mean 20, mean PCWP 8, CI 2.09, 40% mLAD stenosis.  Upgrade to MDT CRT-D in 9/16.  - Echo (4/17) with EF 30-35%.   - LV lead dislodged and loss of BiV pacing.  - Echo (5/18) with EF 20%, mild LV dilation, normal RV size with mildly decreased systolic function, mild AI.  3. CKD 4. 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  5. Atrial tachycardia: paroxysmal. 6. Atrial flutter: s/p ablation in 8/16.  7. OSA 8. Hyperlipidemia 9. Shingles with post-herpetic neuralgia.  10. Atrial fibrillation: Paroxysmal.   FH: Father with CHF, complete heart block with PPM.  Sister with complete heart block and PPM.   SH: Retired Curator, nonsmoker, lives in [place]: All systems reviewed and negative except as per HPI  Current Outpatient Prescriptions  Medication Sig Dispense Refill  . acetaminophen (TYLENOL) 325 MG tablet Take 1-2 tablets (325-650 mg total) by mouth every 4 (four) hours as needed for mild pain.    Marland Kitchen  aspirin EC 81 MG tablet Take 1 tablet (81 mg total) by mouth daily. 30 tablet 3  . bisoprolol (ZEBETA) 5 MG tablet Take 0.5 tablets (2.5 mg total) by mouth daily. 15 tablet 3  . digoxin (LANOXIN) 0.125 MG tablet Take 1 tablet (125 mcg total) by mouth daily. 30 tablet 5  . furosemide (LASIX) 20 MG tablet Take 1 tablet (20 mg total) by mouth 2 (two) times daily. 60 tablet 5  . isosorbide-hydrALAZINE (BIDIL) 20-37.5 MG tablet Take 1 tablet by mouth 3 (three) times daily. 90 tablet 2  .  sacubitril-valsartan (ENTRESTO) 24-26 MG Take 1 tablet by mouth 2 (two) times daily. 60 tablet 5  . simvastatin (ZOCOR) 40 MG tablet Take 1 tablet (40 mg total) by mouth daily. 30 tablet 1  . spironolactone (ALDACTONE) 25 MG tablet Take 1 tablet (25 mg total) by mouth daily. 30 tablet 5   No current facility-administered medications for this encounter.    BP 111/67   Pulse 62   Wt 213 lb (96.6 kg)   SpO2 100%   BMI 28.10 kg/m  General: Well appearing male, NAD. Walked into clinic without difficulty.  Neck: No JVD.No thyromegaly or thyroid nodule.  Lungs: Clear bilaterally, normal effort.  CV: Nondisplaced PMI. Heart regular rate and rhythm. No S3/S4, no murmur.  No peripheral edema.  No carotid bruit.  Normal pedal pulses.ICD in L upper chest  Abdomen: Soft, nontender, no hepatosplenomegaly, no distention. + bowel sounds  Skin: Intact without lesions or rashes.  Neurologic: Alert and oriented x 3.  Psych: Normal affect. Extremities: No clubbing or cyanosis.  HEENT: Normal.   Assessment/Plan: 1. Chronic systolic CHF: Nonischemic cardiomyopathy.  EF 15% on echo in 8/16.  Patient had borderline reduced EF when PPM was placed in 2/15.  After that, EF fell significantly.  He has a history of complete heart block and father had CHF and CHB with pacemaker, and sounds like sister also had CHB with pacemaker. 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  He had CRT upgrade.  No significant CAD on last cath.  Echo in 4/17 showed improvement in EF to 30-35%.  However, his LV lead has dislodged and he is no longer BiV pacing.  Echo was done today and reviewed, EF down to 20%.  Symptomatically, he is still doing pretty well (NYHA class II) and no volume overload on exam or by Optivol.  - Coreg stopped due to erectile dysfunction, now on bisoprolol 2.5mg . Will increase bisoprolol to 5mg  daily.  - Continue  Entresto 24/26 bid, spironolactone 25 daily, Lasix 20 bid.   BMET today.   - Continue digoxin. Get Dig level today.  - Continue Bidil 1 tab TID .  - Echo today with reduced EF to 20%, possibly due to loss of BiV pacing.  Will discuss options with Dr Lovena Le, would favor replacement of LV lead versus His bundle pacing.  - Will need to consider genetic evaluation to look for LMNA or SCN5A mutations that could be used for familial screening. He would like to hold off.  2. Atrial flutter: s/p ablation.  3. Atrial fibrillation: Currently in NSR but paroxysmal atrial fibrillation noted on device interrogation.  He has episodes lasting up to 10 hours.  This patient's CHA2DS2-VASc Score and unadjusted Ischemic Stroke Rate (% per year) is equal to 3.2 % stroke rate/year from a score of 3 Above score calculated as 1 point each if present [CHF, HTN, DM, Vascular=MI/PAD/Aortic Plaque,  Age if 50-74, or Male], 2 points each if present [Age > 75, or Stroke/TIA/TE] - Start Eliquis 5mg  BID. Stop ASA. Will check CBC. 3. PE/DVT: >1 year ago and associated with surgery.   4. Hyperlipidemia: He has mild CAD.   - No ASA with Eliquis starting.   - Continue simvastatin 40mg  daily.    Arbutus Leas 06/18/2016   Patient seen with NP, agree with the above note.  Symptomatically still doing well and not significantly volume overloaded.  However, EF noted to drop from 30-35% to 20% in setting of loss of BiV pacing.  - Increase bisoprolol to 5 mg daily as above.  - Will discuss replacement of LV lead versus His bundle pacing with Dr. Lovena Le.   Paroxysmal atrial fibrillation noted by device interrogation.  Stop ASA, start Eliquis 5 mg bid.    Loralie Champagne 06/20/2016

## 2016-06-18 NOTE — Patient Instructions (Addendum)
Increase Bisoprolol to 5 mg daily  Stop Aspirin  Start Eliquis 5 mg Twice daily   Labs today  Your physician recommends that you schedule a follow-up appointment in: 2 months

## 2016-06-18 NOTE — Progress Notes (Signed)
  Echocardiogram 2D Echocardiogram has been performed.  Bobbye Charleston 06/18/2016, 1:58 PM

## 2016-06-21 ENCOUNTER — Other Ambulatory Visit: Payer: Self-pay | Admitting: Internal Medicine

## 2016-06-24 ENCOUNTER — Other Ambulatory Visit (HOSPITAL_COMMUNITY): Payer: Self-pay | Admitting: Cardiology

## 2016-06-24 MED FILL — ENTRESTO 24 MG-26 MG TABLET: 24-26 | 30 days supply | Qty: 60 | Fill #4

## 2016-06-26 ENCOUNTER — Telehealth (HOSPITAL_COMMUNITY): Payer: Self-pay | Admitting: *Deleted

## 2016-06-26 NOTE — Telephone Encounter (Signed)
Patient called complaining of knee pain which started 2 days after starting Eliquis last week.  Patient stated there is no swelling or redness just stiff and feels that he may have a clot.  I explained to patient that Eliquis is the treatment for clot and since he does not have any swelling or redness Dr. Aundra Dubin feels that he does not have a clot.  I advised him to call his PCP for the knee pain but to call us back if he were to start having swelling and/or redness in that knee. Patient understands and no further questions at this time.

## 2016-06-27 ENCOUNTER — Encounter: Payer: Self-pay | Admitting: Family Medicine

## 2016-06-27 ENCOUNTER — Ambulatory Visit (INDEPENDENT_AMBULATORY_CARE_PROVIDER_SITE_OTHER): Payer: Medicare Other | Admitting: Family Medicine

## 2016-06-27 VITALS — BP 114/62 | HR 78 | Temp 98.7°F | Ht 73.0 in | Wt 214.8 lb

## 2016-06-27 DIAGNOSIS — M25561 Pain in right knee: Secondary | ICD-10-CM | POA: Diagnosis present

## 2016-06-27 MED ORDER — TRAMADOL HCL 50 MG PO TABS: 50.0000 mg | ORAL_TABLET | Freq: Four times a day (QID) | ORAL | 0 refills | Status: DC | PRN

## 2016-06-27 MED FILL — traMADol HCL 50 MG TABS: 50 | 7 days supply | Qty: 30 | Fill #0

## 2016-06-27 NOTE — Patient Instructions (Addendum)
Please take Tramadol as needed for knee pain. You may also use ice to help with inflammation. Please follow up in [facility].   Dr. Gerlean Ren  [facility] Address: [address], [place], [place] [address] Phone: [phone]   Quadriceps Strain A quadriceps strain is an injury to the muscles or tendons on the front of the thigh. The quadriceps muscles are used in straightening the knee and bending the hip. A strain occurs when the muscle is overstretched or overloaded. There are three types of strains:  Grade 1 is a mild strain. It involves a stretching or minor tearing of your muscle fibers or tendons. You should have little, if any, trouble using your thigh.  Grade 2 is a moderate strain. It involves a partial tearing of your muscle fibers or tendons. You will have pain and some loss of strength in your thigh.  Grade 3 is a severe strain. It involves a complete tearing of your muscle fibers or tendon. It causes severe pain and loss of strength in your thigh. Recovery will take a few weeks or longer, depending on how bad your strain is. What are the causes? This injury is caused by overextending the muscles in the thigh. What increases the risk? The following factors may make you more likely to develop this injury:  Participating in:  Activities that involve jumping or sprinting.  Contact sports, such as football or soccer.  Having a previous injury to your thigh or knee.  Having poor strength and flexibility.  Not warming up properly before activity.  Having one leg that is much stronger than the other.  Exercising to the point of exhaustion. What are the signs or symptoms? Symptoms of this condition include:  Sudden, severe pain in your thigh.  Pain and tenderness over your quadriceps muscles. The pain gets worse when you use these muscles.  Muscle spasm in your thigh.  Bruising.  Having trouble with tasks that involve using your quadriceps  muscle, such as walking.  A crackling sound when the tendon is moved or touched. How is this diagnosed? This condition may be diagnosed based on your symptoms, your medical history, and a physical exam. Your health care provider may order tests such as an X-ray, ultrasound, or MRI to further evaluate your injury and to rule out other conditions. How is this treated? Treatment for this condition may include:  Resting your leg and avoiding activities that cause pain.  Taking medicine to help reduce pain and inflammation.  Applying ice to the area to relieve swelling and inflammation.  Using crutches until you can walk without pain.  Working with a physical therapist on exercises to restore strength and flexibility in your thigh. In rare cases, surgery may be needed. Follow these instructions at home: Managing pain, stiffness, and swelling   If directed, put ice on the injured area:  Put ice in a plastic bag.  Place a towel between your skin and the bag.  Leave the ice on for 20 minutes, 2-3 times a day.  Raise (elevate) the injured area above the level of your heart while you are sitting or lying down. Activity   Do not use the injured limb to support your body weight until your health care provider says that you can. Use crutches as told by your health care provider.  Do exercises as told by your health care provider.  Return to your normal activities as told by your health care provider. Ask your health care  provider what activities are safe for you. General instructions   Take over-the-counter and prescription medicines only as told by your health care provider.  Keep all follow-up visits as told by your health care provider. This is important. How is this prevented?  Warm up and stretch before being active.  Cool down and stretch after being active.  Give your body time to rest between periods of activity.  Make sure to use equipment that fits you.  Be safe and  responsible while being active to avoid falls.  Do at least 150 minutes of moderate-intensity exercise each week, such as brisk walking or water aerobics.  Maintain physical fitness, including:  Strength  Flexibility.  Cardiovascular fitness.  Endurance. Contact a health care provider if:  Your pain, bruising, or tenderness gets worse, even with treatment.  Your leg becomes weaker. This information is not intended to replace advice given to you by your health care provider. Make sure you discuss any questions you have with your health care provider. Document Released: 01/21/2005 Document Revised: 09/26/2015 Document Reviewed: 10/25/2014 Elsevier Interactive Patient Education  2017 [employer].

## 2016-06-28 ENCOUNTER — Ambulatory Visit (INDEPENDENT_AMBULATORY_CARE_PROVIDER_SITE_OTHER): Payer: Medicare Other

## 2016-06-28 DIAGNOSIS — Z95810 Presence of automatic (implantable) cardiac defibrillator: Secondary | ICD-10-CM

## 2016-06-28 DIAGNOSIS — I5022 Chronic systolic (congestive) heart failure: Secondary | ICD-10-CM

## 2016-06-28 NOTE — Progress Notes (Signed)
EPIC Encounter for ICM Monitoring  Patient Name: Philip Chavez is a 66 y.o. male Date: 06/28/2016 Primary Care Physican: Lorna Few, DO [facility] Electrophysiologist: Lovena Le Dry Weight:213lb  Bi-V Pacing: 96.7%       Heart Failure questions reviewed, pt asymptomatic.   Thoracic impedance normal.  Prescribed dosage: Furosemide 20 mg 1 tablet twice a day  Labs: 03/19/2016 Creatinine 1.24, BUN 13, Potassium 4.0, Sodium 135, EGFR 59->60 02/19/2016 Creatinine 1.18, BUN 15, Potassium 4.1, Sodium 136, EGFR >60  12/19/2015 Creatinine 1.32, BUN 12, Potassium 4.1, Sodium 136, EGFR 55->60  07/10/2015 Creatinine 1.23, BUN 11, Potassium 3.9, Sodium 137, EGFR >60   Recommendations: No changes. Discussed to limit salt intake to 2000 mg/day and fluid intake to < 2 liters/day.  Encouraged to call for fluid symptoms or use local ER for any urgent symptoms.  Follow-up plan: ICM clinic phone appointment on 08/15/2016 since he has an office appointment scheduled with Dr Lovena Le 07/15/2016.  Copy of ICM check sent to primary cardiologist and device physician.   3 month ICM trend: 06/28/2016   1 Year ICM trend:      Rosalene Billings, RN 06/28/2016 8:55 AM

## 2016-07-01 NOTE — Progress Notes (Signed)
Subjective:     Patient ID: Philip Chavez, male   DOB: 1950/12/20, 66 y.o.   MRN: [id number]  HPI Philip Chavez is a 66yo male presenting today for right knee pain. Reports 2 week history of right knee pain. Denies trauma. Reports he just woke up 2 weeks ago and his knee was painful. Pain is worse with prolonged sitting and he has to wait a few seconds after standing before he is able to walk. First noted mild swelling today. Denies increased redness or warmth. Has been taking Tylenol, which has not been helping. Transitioned from Aspirin to Eliquis two weeks ago, but he is unsure if this is related. Denies giving out, locking, popping.  Former Smoker  Review of Systems Per HPI    Objective:   Physical Exam  Constitutional: He appears well-developed and well-nourished. No distress.  Cardiovascular: Normal rate and regular rhythm.   No murmur heard. Pulmonary/Chest: Effort normal. No respiratory distress. He has no wheezes.  Musculoskeletal:  ROM of knees equal bilaterally. No joint line tenderness. No tenderness over quad tendon or patellar tendon. Does have tenderness lateral knee superior to joint line--vastus lateralis. ACL, PCL, LCL, and MCL intact and symmetric bilaterally. Negative Thessaly.  Psychiatric: He has a normal mood and affect. His behavior is normal.      Assessment and Plan:     1. Acute pain of right knee Pain over vastus lateralis. Attempted [place], however machine broke. Referral to Sports Medicine for further evaluation, may need [place] if no improvement. NSAIDs contraindicated given significant cardiac history. Prescription for Tramadol given. Recommend ice and rest if able. Follow up if symptoms worsen or fail to improve. - Ambulatory referral to Sports Medicine

## 2016-07-02 MED FILL — BISOPROLOL FUMARATE 5 MG TA: 5 | 30 days supply | Qty: 30 | Fill #0

## 2016-07-10 ENCOUNTER — Encounter: Payer: Self-pay | Admitting: Family Medicine

## 2016-07-10 ENCOUNTER — Ambulatory Visit (INDEPENDENT_AMBULATORY_CARE_PROVIDER_SITE_OTHER): Payer: Medicare Other | Admitting: Family Medicine

## 2016-07-10 DIAGNOSIS — M25561 Pain in right knee: Secondary | ICD-10-CM

## 2016-07-10 NOTE — Assessment & Plan Note (Addendum)
Had acute knee pain 3 weeks ago without any clear etiology, was seen by PCP 2 weeks ago. Pain as resolved prior to today's visit with 4 days of tramadol. Doing usual activity including walking 2-3 miles without any problem. Normal knee exam today. He has laterally deviated subtallar joint axis due to previous ankle injury which likely causes mechanical problem to the knee and could be a pain generator.    RTC as needed if pain recurs, we could consider joint injection or orthotics to correct his ankle joint mechanics.   Agree of plan of resident.  Would consider x-rays in future and/or [place]  Can try a topical NSAID in the future as well given his cardiac history.

## 2016-07-10 NOTE — Progress Notes (Signed)
   CC: right knee pain   HPI:  Philip Chavez is a 66 y.o. with pmh as listed below is here for evaluation of R knee pain.  Was seen by his PCP on 5/24 for complaint of right knee pain x2 weeks during that visit, no trauma, no injuries, woke up with the knee pain. Had pain over vastus lateralis on their exam. POC u/s was malfunctioning in their office so could not be done. NSAID was not given due to CAD, has been taking tramadol since that visit. Took tramadol for 4 days and continued with his usual activity in the mean time which involves walking 2-3 miles daily. His pain resolved completely after 4 days. No complaints today. No hx of gout or arthritis.   Had right ankle injury from falling off a ladder in the remote past, required surgery. Unclear about the details but states there were bony fragments.   Past Medical History:  Diagnosis Date  . AICD (automatic cardioverter/defibrillator) present 0919/2016   BIV   . Atrial flutter ([place])    a. 08/2014 presented w/ aflutter->converted on amio;  b. CHA2DS2VASc = 2 -->Xarelto.  . Atrial tachycardia ([place])   . Chronic combined systolic and diastolic CHF (congestive heart failure) ([place])    a. 03/2013 Echo EF 50-55%;  b. 07/2013 Echo EF 20-25%, diff HK, Gr3 DD;  c. 08/2014 Echo: EF 15%, diff HK, mild AI/MR, mildly dil LA/RV, mod TR, PASP 74mmHg.  Marland Kitchen DVT (deep venous thrombosis) ([place])    a. 2007 RLE: S/P ankle surgery;  b. 03/2013 LLE DVT & PE-->Xarelto.  . Embolism, pulmonary with infarction ([place])    a. 2007 RLE: S/P ankle surgery;  b. 03/2013 LLE DVT & PE-->Xarelto.  . Hypercholesterolemia   . Hypercoagulable state ([place])   . Hypertension   . Musculoskeletal neck pain   . Nonischemic cardiomyopathy ([place])    a. 07/2013 Echo: EF 20-25%;  b. 07/2013 Myoview: Large inferior, lateral, apical scar w/ HK, no ischemia, EF 17%;  b. 08/2014 Echo: EF 15%, diff HK.  Marland Kitchen Pneumonia ~ 04/2014; 09/02/2014  . Presence of permanent cardiac pacemaker    a. 03/2013 s/p MDT  ADDRL1 Adapta DC PPM, ser # [id number] H.  . Sleep apnea    does not wear mask (09/02/2014)  . Third degree heart block ([place])    a. 03/2013 s/p MDT ADDRL1 Adapta DC PPM, ser # [id number] H.    Review of Systems:  See HPI  Physical Exam:  Vitals:   07/10/16 1052  BP: (!) 90/58  Weight: 214 lb (97.1 kg)  Height: 6\' 1"  (1.854 m)   Pleasant male, NAD. Alert and oriented x4 Both knees appear normal, no swelling, erythema, or effusion. Normal muscle bulk. No tenderness with palpation. Full ROM on both knees without pain. 5/5 strength bilaterally on all planes. Normal anterior and posterior drawer test, normal McMurray's test, normal valgus and vargus stress test.  Right ankle has surgical scar, laterally deviated subtalar joint axis.   Assessment & Plan:   See Encounters Tab for problem based charting.

## 2016-07-11 MED FILL — DIGOXIN 0.125 MG TABLET: 125 | 30 days supply | Qty: 30 | Fill #4

## 2016-07-12 NOTE — Progress Notes (Signed)
  Philip Chavez - 66 y.o. male MRN [id number]  Date of birth: 01-09-1951  SUBJECTIVE:  Including CC & ROS.   Philip Chavez is a 66 yo M that is presenting with right knee pain. Reports his right knee is feeling much better today. Has taken tramadol for pain. Denies any swelling. Pain was occurring for three weeks.   ROS: No unexpected weight loss, fever, chills, swelling, instability, muscle pain, numbness/tingling, redness, otherwise see HPI   HISTORY: Past Medical, Surgical, Social, and Family History Reviewed & Updated per EMR.   Pertinent Historical Findings include: PMHx: CAD, DVT, hypercoagulable state, CHF   Surgical:   Right ankle reconstruction  Social:  Former smoker FHx: DM2, Asthma   DATA REVIEWED: none  PHYSICAL EXAM:  VS: BP (!) 90/58   Ht 6\' 1"  (1.854 m)   Wt 214 lb (97.1 kg)   BMI 28.23 kg/m  PHYSICAL EXAM: Gen: NAD, alert, cooperative with exam, well-appearing HEENT: clear conjunctiva, EOMI CV:  no edema, capillary refill brisk,  Resp: non-labored, normal speech Skin: no rashes, normal turgor  Neuro: no gross deficits.  Psych:  alert and oriented MSK:  Right knee:  No effusion  No TTP of the medial or lateral joint line  Normal flexion and extension Normal QT and PT  Normal endpoints with valgus and varus stress testing.  Neurovascularly intact  Right ankle:  Surgical scar overlying subtalar joint.  Limited ankle mobility  Loss of longitudinal arch    ASSESSMENT & PLAN:   Acute pain of right knee Had acute knee pain 3 weeks ago without any clear etiology, was seen by PCP 2 weeks ago. Pain as resolved prior to today's visit with 4 days of tramadol. Doing usual activity including walking 2-3 miles without any problem. Normal knee exam today. He has laterally deviated subtallar joint axis due to previous ankle injury which likely causes mechanical problem to the knee and could be a pain generator.    RTC as needed if pain recurs, we could consider joint  injection or orthotics to correct his ankle joint mechanics.   Agree of plan of resident.  Would consider x-rays in future and/or [place]  Can try a topical NSAID in the future as well given his cardiac history.

## 2016-07-15 ENCOUNTER — Ambulatory Visit (INDEPENDENT_AMBULATORY_CARE_PROVIDER_SITE_OTHER): Payer: Medicare Other | Admitting: Internal Medicine

## 2016-07-15 ENCOUNTER — Encounter (INDEPENDENT_AMBULATORY_CARE_PROVIDER_SITE_OTHER): Payer: Self-pay

## 2016-07-15 ENCOUNTER — Encounter: Payer: Self-pay | Admitting: Internal Medicine

## 2016-07-15 VITALS — BP 104/60 | HR 81 | Ht 73.0 in | Wt 211.0 lb

## 2016-07-15 DIAGNOSIS — Z95810 Presence of automatic (implantable) cardiac defibrillator: Secondary | ICD-10-CM

## 2016-07-15 DIAGNOSIS — I5042 Chronic combined systolic (congestive) and diastolic (congestive) heart failure: Secondary | ICD-10-CM | POA: Diagnosis not present

## 2016-07-15 DIAGNOSIS — I4892 Unspecified atrial flutter: Secondary | ICD-10-CM

## 2016-07-15 DIAGNOSIS — I428 Other cardiomyopathies: Secondary | ICD-10-CM

## 2016-07-15 NOTE — Patient Instructions (Addendum)
Medication Instructions:  Your physician recommends that you continue on your current medications as directed. Please refer to the Current Medication list given to you today.   Labwork: None Ordered    Testing/Procedures: Call at the end of August 2018 to schedule ICD System Revision - [phone]   Follow-Up: Your physician wants you to follow-up in: December 2018 with Dr. Lovena Le. You will receive a reminder letter in the mail two months in advance. If you don't receive a letter, please call our office to schedule the follow-up appointment.  Remote monitoring is used to monitor your ICD from home. This monitoring reduces the number of office visits required to check your device to one time per year. It allows [place] to keep an eye on the functioning of your device to ensure it is working properly. You are scheduled for a device check from home on  10/14/16. You may send your transmission at any time that day. If you have a wireless device, the transmission will be sent automatically. After your physician reviews your transmission, you will receive a postcard with your next transmission date.    Any Other Special Instructions Will Be Listed Below (If Applicable).     If you need a refill on your cardiac medications before your next appointment, please call your pharmacy.

## 2016-07-15 NOTE — Progress Notes (Signed)
HPI Mr. Mitzel returns today for followup. He is a 66 yo man with a h/o complete heart block and HTN, s/p PPM insertion. He has developed LV dysfunction. He had atrial flutter and underwent ablation. He has not had syncope. His EF has been less than 30% for over a year. He has been on maximal medical therapy. His heart failure is class 2A. 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 He has undergone BiV ICD implantation. He was noted to have worsening LV thresholds and found to have dislodgement of his LV lead back into the CS. He saw Dr. Aundra Dubin and is referred back to discuss options for treatment. The patient remains busy at work and has no limitations. He is RV pacing almost all of the time. No Known Allergies   Current Outpatient Prescriptions  Medication Sig Dispense Refill  . acetaminophen (TYLENOL) 325 MG tablet Take 1-2 tablets (325-650 mg total) by mouth every 4 (four) hours as needed for mild pain.    Marland Kitchen apixaban (ELIQUIS) 5 MG TABS tablet Take 1 tablet (5 mg total) by mouth 2 (two) times daily. 60 tablet 6  . BIDIL 20-37.5 MG tablet TAKE 1 TABLET BY MOUTH 3 TIMES DAILY. 90 tablet 2  . bisoprolol (ZEBETA) 5 MG tablet Take 1 tablet (5 mg total) by mouth daily. 30 tablet 6  . digoxin (LANOXIN) 0.125 MG tablet Take 1 tablet (125 mcg total) by mouth daily. 30 tablet 5  . furosemide (LASIX) 20 MG tablet Take 1 tablet (20 mg total) by mouth 2 (two) times daily. 60 tablet 5  . sacubitril-valsartan (ENTRESTO) 24-26 MG Take 1 tablet by mouth 2 (two) times daily. 60 tablet 5  . simvastatin (ZOCOR) 40 MG tablet Take 1 tablet (40 mg total) by mouth daily. 30 tablet 1  . spironolactone (ALDACTONE) 25 MG tablet Take 1 tablet (25 mg total) by mouth daily. 30 tablet 5  . traMADol (ULTRAM) 50 MG tablet Take 1 tablet (50 mg total) by mouth every 6 (six) hours as needed. 30 tablet 0   No current facility-administered medications for this visit.      Past Medical History:    Diagnosis Date  . AICD (automatic cardioverter/defibrillator) present 0919/2016   BIV   . Atrial flutter ([place])    a. 08/2014 presented w/ aflutter->converted on amio;  b. CHA2DS2VASc = 2 -->Xarelto.  . Atrial tachycardia ([place])   . Chronic combined systolic and diastolic CHF (congestive heart failure) ([place])    a. 03/2013 Echo EF 50-55%;  b. 07/2013 Echo EF 20-25%, diff HK, Gr3 DD;  c. 08/2014 Echo: EF 15%, diff HK, mild AI/MR, mildly dil LA/RV, mod TR, PASP 56mmHg.  Marland Kitchen DVT (deep venous thrombosis) ([place])    a. 2007 RLE: S/P ankle surgery;  b. 03/2013 LLE DVT & PE-->Xarelto.  . 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 2007 RLE: S/P ankle surgery;  b. 03/2013 LLE DVT & PE-->Xarelto.  . Hypercholesterolemia   . Hypercoagulable state ([place])   . Hypertension   . Musculoskeletal neck pain   . Nonischemic cardiomyopathy ([place])    a. 07/2013 Echo: EF 20-25%;  b. 07/2013 Myoview: Large inferior, lateral, apical scar w/ HK, no ischemia, EF 17%;  b. 08/2014 Echo: EF 15%, diff HK.  Marland Kitchen Pneumonia ~ 04/2014; 09/02/2014  . Presence of permanent cardiac pacemaker    a. 03/2013 s/p MDT ADDRL1 Adapta DC PPM, ser # [id number].  . Sleep apnea  does not wear mask (09/02/2014)  . Third degree heart block ([place])    a. 03/2013 s/p MDT ADDRL1 Adapta DC PPM, ser # [id number].    ROS:   All systems reviewed and negative except as noted in the HPI.   Past Surgical History:  Procedure Laterality Date  . CARDIAC CATHETERIZATION N/A 10/17/2014   Procedure: Right/Left Heart Cath and Coronary Angiography;  Surgeon: Larey Dresser, MD;  Location: [facility];  Service: Cardiovascular;  Laterality: N/A;  . ELECTROPHYSIOLOGIC STUDY N/A 09/07/2014   Procedure: A-Flutter;  Surgeon: Evans Lance, MD;  Location: [facility];  Service: Cardiovascular;  Laterality: N/A;  . EP IMPLANTABLE DEVICE  10/24/2014   BIV  . EP IMPLANTABLE DEVICE N/A 10/24/2014   Procedure: BiV ICD Upgrade;  Surgeon: Evans Lance, MD;   Location: [facility];  Service: Cardiovascular;  Laterality: N/A;  . FOOT FRACTURE SURGERY Right 2007  . FRACTURE SURGERY    . INGUINAL HERNIA REPAIR Right 1980's  . INSERT / REPLACE / REMOVE PACEMAKER     MDT ADDRL1 pacemaker implanted by Dr Lovena Le for complete heart block  . PERMANENT PACEMAKER INSERTION N/A 03/15/2013   Procedure: PERMANENT PACEMAKER INSERTION;  Surgeon: Evans Lance, MD;  Location: [facility];  Service: Cardiovascular;  Laterality: N/A;     Family History  Problem Relation Age of Onset  . Diabetes type II Mother   . Heart disease Father   . Arrhythmia Father   . Arrhythmia Sister   . Hypertension Neg Hx   . Heart attack Neg Hx   . Stroke Neg Hx      Social History   Social History  . Marital status: Married    Spouse name: N/A  . Number of children: N/A  . Years of education: N/A   Occupational History  . Not on file.   Social History Main Topics  . Smoking status: Former Smoker    Packs/day: 1.50    Years: 10.00    Types: Cigarettes  . Smokeless tobacco: Never Used     Comment: 09/02/2014  "quit smoking 20-30 yr ago"  . Alcohol use No  . Drug use: No  . Sexual activity: Yes   Other Topics Concern  . Not on file   Social History Narrative  . No narrative on file     BP 104/60   Pulse 81   Ht 6\' 1"  (1.854 m)   Wt 211 lb (95.7 kg)   SpO2 96%   BMI 27.84 kg/m   Physical Exam:  Well appearing middle aged man,NAD HEENT: Unremarkable Neck:  7 cm JVD, no thyromegally Back:  No CVA tenderness Lungs:  Clear with no wheezes, well healed PPM incision. HEART:  Regular rate rhythm, no murmurs, no rubs, no clicks, soft S3., LV heave Abd:  soft, positive bowel sounds, no organomegally, no rebound, no guarding Ext:  2 plus pulses, no edema, no cyanosis, no clubbing Skin:  No rashes no nodules Neuro:  CN II through XII intact, motor grossly intact   DEVICE  His LV lead has dislodged, otherwise his device is working normally.     Assess/Plan:  1. Chronic systolic heart failure -his symptoms are class 2A, bordering on class 1. He will continue his current meds. He admits to dietary indiscretion and I have asked him to take an extra lasix when he eats a salty meal. I have offered him LV lead revision but because he feels well,  he prefers to undergo watchful waiting and only undergo revision if he develops worsening SOB. He states that he has a lot of work upcoming and hopes to reconsider his lead revision in a few weeks. 2. ICD - his medtronic Biv ICD has been reprogrammed so that he is no longer LV pacing.  3. HTN - his blood pressure is well controlled. He denies medical non-compliance.  Will follow.  Philip Chavez.D.

## 2016-07-16 LAB — CUP PACEART INCLINIC DEVICE CHECK
Battery Remaining Longevity: 72 mo
Brady Statistic AP VS Percent: 0 %
Brady Statistic AS VS Percent: 1.9 %
Brady Statistic RV Percent Paced: 97.26 %
HighPow Impedance: 60 Ohm
Implantable Lead Implant Date: 20160919
Implantable Lead Location: 753858
Implantable Lead Location: 753859
Implantable Lead Model: 4598
Implantable Lead Model: 5076
Implantable Lead Model: 6935
Implantable Pulse Generator Implant Date: 20160919
Lead Channel Impedance Value: 304 Ohm
Lead Channel Impedance Value: 304 Ohm
Lead Channel Impedance Value: 323 Ohm
Lead Channel Impedance Value: 380 Ohm
Lead Channel Impedance Value: 399 Ohm
Lead Channel Impedance Value: 456 Ohm
Lead Channel Impedance Value: 513 Ohm
Lead Channel Impedance Value: 532 Ohm
Lead Channel Impedance Value: 570 Ohm
Lead Channel Pacing Threshold Amplitude: 1 V
Lead Channel Pacing Threshold Amplitude: 2.5 V
Lead Channel Pacing Threshold Pulse Width: 0.4 ms
Lead Channel Pacing Threshold Pulse Width: 0.4 ms
Lead Channel Sensing Intrinsic Amplitude: 14.125 mV
Lead Channel Sensing Intrinsic Amplitude: 9.625 mV
Lead Channel Setting Pacing Amplitude: 2 V
Lead Channel Setting Pacing Pulse Width: 0.4 ms
Lead Channel Setting Sensing Sensitivity: 0.3 mV
MDC IDC LEAD IMPLANT DT: 20150209
MDC IDC LEAD IMPLANT DT: 20160919
MDC IDC LEAD LOCATION: 753860
MDC IDC MSMT BATTERY VOLTAGE: 2.98 V
MDC IDC MSMT LEADCHNL LV IMPEDANCE VALUE: 304 Ohm
MDC IDC MSMT LEADCHNL LV IMPEDANCE VALUE: 323 Ohm
MDC IDC MSMT LEADCHNL LV IMPEDANCE VALUE: 513 Ohm
MDC IDC MSMT LEADCHNL RA IMPEDANCE VALUE: 399 Ohm
MDC IDC MSMT LEADCHNL RA SENSING INTR AMPL: 3.125 mV
MDC IDC MSMT LEADCHNL RV PACING THRESHOLD AMPLITUDE: 0.875 V
MDC IDC MSMT LEADCHNL RV PACING THRESHOLD PULSEWIDTH: 0.4 ms
MDC IDC MSMT LEADCHNL RV SENSING INTR AMPL: 13.375 mV
MDC IDC SESS DTM: 20180611182844
MDC IDC SET LEADCHNL RV PACING AMPLITUDE: 2 V
MDC IDC STAT BRADY AP VP PERCENT: 7.53 %
MDC IDC STAT BRADY AS VP PERCENT: 90.57 %
MDC IDC STAT BRADY RA PERCENT PACED: 7.02 %

## 2016-07-17 MED FILL — ELIQUIS 5 MG TABLET: 5 | 30 days supply | Qty: 60 | Fill #1

## 2016-07-23 ENCOUNTER — Telehealth (HOSPITAL_COMMUNITY): Payer: Self-pay | Admitting: *Deleted

## 2016-07-23 MED FILL — SPIRONOLACTONE 25 MG TABLET: 25 | 30 days supply | Qty: 30 | Fill #5

## 2016-07-23 NOTE — Telephone Encounter (Signed)
Medication Samples have been provided to the patient.  Drug name: Delene Loll    Strength: 24-26mg       Qty: 2  LOT: [id number] Exp.Date: 04/20  Dosing instructions: Take one tablet by mouth Twice Daily  The patient has been instructed regarding the correct time, dose, and frequency of taking this medication, including desired effects and most common side effects.   Elige Ko S 4:09 PM 07/23/2016

## 2016-07-24 ENCOUNTER — Other Ambulatory Visit (HOSPITAL_COMMUNITY): Payer: Self-pay | Admitting: Cardiology

## 2016-07-25 MED FILL — ENTRESTO 24 MG-26 MG TABLET: 24-26 | 30 days supply | Qty: 60 | Fill #5

## 2016-07-29 ENCOUNTER — Telehealth (HOSPITAL_COMMUNITY): Payer: Self-pay

## 2016-07-29 NOTE — Telephone Encounter (Signed)
Philip Chavez received was approved for Entresto 24-26 mg on 07/14/2016 through 07/23/2017.   Phillis Knack PharmD Candidate

## 2016-07-30 ENCOUNTER — Telehealth: Payer: Self-pay | Admitting: Cardiology

## 2016-07-30 ENCOUNTER — Telehealth: Payer: Self-pay | Admitting: Internal Medicine

## 2016-07-30 ENCOUNTER — Ambulatory Visit (INDEPENDENT_AMBULATORY_CARE_PROVIDER_SITE_OTHER): Payer: Medicare Other | Admitting: *Deleted

## 2016-07-30 DIAGNOSIS — I428 Other cardiomyopathies: Secondary | ICD-10-CM | POA: Diagnosis not present

## 2016-07-30 NOTE — Telephone Encounter (Signed)
New message   Pt is asking for a call back about this medication. He states that he is out of his medication for 5 days.   *STAT* If patient is at the pharmacy, call can be transferred to refill team.   1. Which medications need to be refilled? (please list name of each medication and dose if known) isobride   2. Which pharmacy/location (including street and city if local pharmacy) is medication to be sent to? [facility] on [address]   3. Do they need a 30 day or 90 day supply? 30 day

## 2016-07-30 NOTE — Telephone Encounter (Signed)
Spoke with pt and reminded pt of remote transmission that is due today. Pt verbalized understanding.   

## 2016-07-31 ENCOUNTER — Encounter: Payer: Self-pay | Admitting: Cardiology

## 2016-07-31 LAB — CUP PACEART REMOTE DEVICE CHECK
Battery Remaining Longevity: 73 mo
Battery Voltage: 2.94 V
Brady Statistic AP VS Percent: 0 %
Brady Statistic RA Percent Paced: 7.65 %
Date Time Interrogation Session: 20180626204834
HIGH POWER IMPEDANCE MEASURED VALUE: 61 Ohm
Implantable Lead Implant Date: 20150209
Implantable Lead Implant Date: 20160919
Implantable Lead Location: 753860
Implantable Lead Model: 4598
Implantable Pulse Generator Implant Date: 20160919
Lead Channel Impedance Value: 304 Ohm
Lead Channel Impedance Value: 323 Ohm
Lead Channel Impedance Value: 513 Ohm
Lead Channel Impedance Value: 532 Ohm
Lead Channel Impedance Value: 532 Ohm
Lead Channel Pacing Threshold Amplitude: 1.125 V
Lead Channel Pacing Threshold Pulse Width: 0.4 ms
Lead Channel Sensing Intrinsic Amplitude: 14.5 mV
Lead Channel Setting Pacing Amplitude: 2 V
Lead Channel Setting Pacing Amplitude: 2.25 V
Lead Channel Setting Pacing Pulse Width: 0.4 ms
Lead Channel Setting Sensing Sensitivity: 0.3 mV
MDC IDC LEAD IMPLANT DT: 20160919
MDC IDC LEAD LOCATION: 753858
MDC IDC LEAD LOCATION: 753859
MDC IDC MSMT LEADCHNL LV IMPEDANCE VALUE: 323 Ohm
MDC IDC MSMT LEADCHNL LV IMPEDANCE VALUE: 342 Ohm
MDC IDC MSMT LEADCHNL LV IMPEDANCE VALUE: 342 Ohm
MDC IDC MSMT LEADCHNL LV IMPEDANCE VALUE: 380 Ohm
MDC IDC MSMT LEADCHNL LV IMPEDANCE VALUE: 513 Ohm
MDC IDC MSMT LEADCHNL LV IMPEDANCE VALUE: 532 Ohm
MDC IDC MSMT LEADCHNL LV PACING THRESHOLD AMPLITUDE: 2.5 V
MDC IDC MSMT LEADCHNL RA IMPEDANCE VALUE: 456 Ohm
MDC IDC MSMT LEADCHNL RA PACING THRESHOLD PULSEWIDTH: 0.4 ms
MDC IDC MSMT LEADCHNL RA SENSING INTR AMPL: 3.375 mV
MDC IDC MSMT LEADCHNL RA SENSING INTR AMPL: 3.375 mV
MDC IDC MSMT LEADCHNL RV IMPEDANCE VALUE: 380 Ohm
MDC IDC MSMT LEADCHNL RV PACING THRESHOLD AMPLITUDE: 1 V
MDC IDC MSMT LEADCHNL RV PACING THRESHOLD PULSEWIDTH: 0.4 ms
MDC IDC MSMT LEADCHNL RV SENSING INTR AMPL: 14.5 mV
MDC IDC STAT BRADY AP VP PERCENT: 8.44 %
MDC IDC STAT BRADY AS VP PERCENT: 89.58 %
MDC IDC STAT BRADY AS VS PERCENT: 1.98 %
MDC IDC STAT BRADY RV PERCENT PACED: 97.22 %

## 2016-07-31 NOTE — Progress Notes (Signed)
Remote ICD transmission.   

## 2016-08-01 ENCOUNTER — Telehealth (HOSPITAL_COMMUNITY): Payer: Self-pay | Admitting: *Deleted

## 2016-08-01 MED ORDER — ISOSORBIDE MONONITRATE ER 30 MG PO TB24: 30.0000 mg | ORAL_TABLET | Freq: Every day | ORAL | 3 refills | Status: DC

## 2016-08-01 MED ORDER — BISOPROLOL FUMARATE 5 MG PO TABS: 5.0000 mg | ORAL_TABLET | Freq: Every day | ORAL | 6 refills | Status: DC

## 2016-08-01 MED FILL — BISOPROLOL FUMARATE 5 MG TA: 5 | 30 days supply | Qty: 30 | Fill #0

## 2016-08-01 NOTE — Telephone Encounter (Signed)
Pt called our office to let us know he is out of his Isosorbide and has been trying to get medication for a week now.  Pt's med list states Bidil not Isosorbide separately.  Discussed with pt, he read bottle to me: Isosorbide 30 mg take 1 a day.  Also had pt read me all of his pill bottle to ensure he did not have Isosorbide and Bidil, he did not have a bottle of Bidil.  Upon further review of chart pt called back in Feb unable to afford Bidil, looks like we offered him copay card but he states he can't remember that and that he has been taking the Isosorbide for as long as he can remember.  Prescription for Isosorbide 30 mg daily along with refill for his Bisoprolol sent into [facility] pharmacy.  Pt is scheduled for f/u 7/11, will address further and make any med changes at that time, message sent to Dr Aundra Dubin for [facility]

## 2016-08-05 DIAGNOSIS — N400 Enlarged prostate without lower urinary tract symptoms: Secondary | ICD-10-CM | POA: Diagnosis not present

## 2016-08-05 DIAGNOSIS — N529 Male erectile dysfunction, unspecified: Secondary | ICD-10-CM | POA: Diagnosis not present

## 2016-08-05 DIAGNOSIS — Z125 Encounter for screening for malignant neoplasm of prostate: Secondary | ICD-10-CM | POA: Diagnosis not present

## 2016-08-12 MED FILL — DIGOXIN 0.125 MG TABLET: 125 | 30 days supply | Qty: 30 | Fill #5

## 2016-08-15 ENCOUNTER — Ambulatory Visit (INDEPENDENT_AMBULATORY_CARE_PROVIDER_SITE_OTHER): Payer: Medicare Other

## 2016-08-15 DIAGNOSIS — H40013 Open angle with borderline findings, low risk, bilateral: Secondary | ICD-10-CM | POA: Diagnosis not present

## 2016-08-15 DIAGNOSIS — Z95810 Presence of automatic (implantable) cardiac defibrillator: Secondary | ICD-10-CM | POA: Diagnosis not present

## 2016-08-15 DIAGNOSIS — I5042 Chronic combined systolic (congestive) and diastolic (congestive) heart failure: Secondary | ICD-10-CM | POA: Diagnosis not present

## 2016-08-15 DIAGNOSIS — H52223 Regular astigmatism, bilateral: Secondary | ICD-10-CM | POA: Diagnosis not present

## 2016-08-15 DIAGNOSIS — H524 Presbyopia: Secondary | ICD-10-CM | POA: Diagnosis not present

## 2016-08-15 DIAGNOSIS — H5203 Hypermetropia, bilateral: Secondary | ICD-10-CM | POA: Diagnosis not present

## 2016-08-15 NOTE — Progress Notes (Signed)
EPIC Encounter for ICM Monitoring  Patient Name: Philip Chavez is a 66 y.o. male Date: 08/15/2016 Primary Care Physican: Nicolette Bang, DO Primary [place] Electrophysiologist: Lovena Le Dry Weight:213lb  Bi-V Pacing: 97.4%   Clinical Status (30-Jul-2016 to 15-Aug-2016) Treated VT/VF 0 AT/AF 2 episodes  Time in AT/AF 3.7 hr/day (15.3%)  Observations (1) (30-Jul-2016 to 15-Aug-2016)  AT/AF >= 6 hr for 4 days.      Heart Failure questions reviewed, pt asymptomatic.   Thoracic impedance normal.  Prescribed dosage: Furosemide 20 mg 1 tablet twice a day  Labs: 03/19/2016 Creatinine 1.24, BUN 13, Potassium 4.0, Sodium 135, EGFR 59->60 02/19/2016 Creatinine 1.18, BUN 15, Potassium 4.1, Sodium 136, EGFR >60  11/14/2017Creatinine 1.32, BUN 12, Potassium 4.1, Sodium 136, EGFR 55->60  06/05/2017Creatinine 1.23, BUN 11, Potassium 3.9, Sodium 137, EGFR >60   Recommendations: No changes.   Encouraged to call for fluid symptoms.  Follow-up plan: ICM clinic phone appointment on 09/17/2016.  Office appointment scheduled 08/20/2016 with Dr. Aundra Dubin.  Copy of ICM check sent to device physician.   3 month ICM trend: 08/15/2016   1 Year ICM trend:      Rosalene Billings, RN 08/15/2016 2:38 PM

## 2016-08-19 MED FILL — ELIQUIS 5 MG TABLET: 5 | 30 days supply | Qty: 60 | Fill #2

## 2016-08-20 ENCOUNTER — Encounter: Payer: Self-pay | Admitting: Cardiology

## 2016-08-20 ENCOUNTER — Ambulatory Visit (HOSPITAL_COMMUNITY)
Admission: RE | Admit: 2016-08-20 | Discharge: 2016-08-20 | Disposition: A | Discharge status: Home / Self Care | Payer: Medicare Other | Source: Ambulatory Visit | Attending: Cardiology | Admitting: Cardiology

## 2016-08-20 VITALS — BP 100/56 | HR 62 | Wt 208.0 lb

## 2016-08-20 DIAGNOSIS — N189 Chronic kidney disease, unspecified: Secondary | ICD-10-CM | POA: Diagnosis not present

## 2016-08-20 DIAGNOSIS — Z86718 Personal history of other venous thrombosis and embolism: Secondary | ICD-10-CM | POA: Diagnosis not present

## 2016-08-20 DIAGNOSIS — Z8249 Family history of ischemic heart disease and other diseases of the circulatory system: Secondary | ICD-10-CM | POA: Insufficient documentation

## 2016-08-20 DIAGNOSIS — I2510 Atherosclerotic heart disease of native coronary artery without angina pectoris: Secondary | ICD-10-CM | POA: Insufficient documentation

## 2016-08-20 DIAGNOSIS — E785 Hyperlipidemia, unspecified: Secondary | ICD-10-CM | POA: Insufficient documentation

## 2016-08-20 DIAGNOSIS — G4733 Obstructive sleep apnea (adult) (pediatric): Secondary | ICD-10-CM | POA: Diagnosis not present

## 2016-08-20 DIAGNOSIS — I5022 Chronic systolic (congestive) heart failure: Secondary | ICD-10-CM

## 2016-08-20 DIAGNOSIS — I429 Cardiomyopathy, unspecified: Secondary | ICD-10-CM | POA: Insufficient documentation

## 2016-08-20 DIAGNOSIS — I480 Paroxysmal atrial fibrillation: Secondary | ICD-10-CM | POA: Diagnosis not present

## 2016-08-20 DIAGNOSIS — I4892 Unspecified atrial flutter: Secondary | ICD-10-CM | POA: Diagnosis not present

## 2016-08-20 DIAGNOSIS — Z8619 Personal history of other infectious and parasitic diseases: Secondary | ICD-10-CM | POA: Insufficient documentation

## 2016-08-20 DIAGNOSIS — Z950 Presence of cardiac pacemaker: Secondary | ICD-10-CM | POA: Insufficient documentation

## 2016-08-20 DIAGNOSIS — Z7901 Long term (current) use of anticoagulants: Secondary | ICD-10-CM | POA: Insufficient documentation

## 2016-08-20 DIAGNOSIS — I442 Atrioventricular block, complete: Secondary | ICD-10-CM | POA: Diagnosis not present

## 2016-08-20 DIAGNOSIS — Z9889 Other specified postprocedural states: Secondary | ICD-10-CM | POA: Insufficient documentation

## 2016-08-20 LAB — BASIC METABOLIC PANEL
ANION GAP: 8 (ref 5–15)
BUN: 18 mg/dL (ref 6–20)
CHLORIDE: 105 mmol/L (ref 101–111)
CO2: 23 mmol/L (ref 22–32)
Calcium: 9.3 mg/dL (ref 8.9–10.3)
Creatinine, Ser: 1.36 mg/dL — ABNORMAL HIGH (ref 0.61–1.24)
GFR calc Af Amer: >60 mL/min (ref >60)
GFR calc non Af Amer: 53 mL/min — ABNORMAL LOW (ref >60)
GLUCOSE: 76 mg/dL (ref 65–99)
POTASSIUM: 4 mmol/L (ref 3.5–5.1)
Sodium: 136 mmol/L (ref 135–145)

## 2016-08-20 LAB — CBC
HEMATOCRIT: 45.4 % (ref 39.0–52.0)
HEMOGLOBIN: 15.4 g/dL (ref 13.0–17.0)
MCH: 31.1 pg (ref 26.0–34.0)
MCHC: 33.9 g/dL (ref 30.0–36.0)
MCV: 91.7 fL (ref 78.0–100.0)
Platelets: 173 10*3/uL (ref 150–400)
RBC: 4.95 MIL/uL (ref 4.22–5.81)
RDW: 13.9 % (ref 11.5–15.5)
WBC: 5.8 10*3/uL (ref 4.0–10.5)

## 2016-08-20 LAB — DIGOXIN LEVEL: DIGOXIN LVL: 0.4 ng/mL — AB (ref 0.8–2.0)

## 2016-08-20 MED ORDER — HYDRALAZINE HCL 25 MG PO TABS: 12.5000 mg | ORAL_TABLET | Freq: Three times a day (TID) | ORAL | 3 refills | Status: DC

## 2016-08-20 MED FILL — hydrALAZINE HCL 25 MG TABS: 25 | 30 days supply | Qty: 45 | Fill #0

## 2016-08-20 NOTE — Patient Instructions (Signed)
Start Hydralazine 12.5 mg (1/2 tab) Three times a day   Labs today  You have been referred to Dr Lovena Le  Your physician recommends that you schedule a follow-up appointment in: 3 months

## 2016-08-21 NOTE — Progress Notes (Signed)
Patient ID: Philip Chavez, male   DOB: Oct 13, 1950, 66 y.o.   MRN: [id number] PCP: Dr. Gerlean Ren EP: Dr. Lovena Le HF cardiologist: Dr Aundra Dubin  66 yo with history of complete heart block/Medtronic PPM, cardiomyopathy, and atrial flutter s/p ablation presents for CHF clinic evaluation.  Patient developed complete heart block in 2/15 and had PPM placed at that time.  He paces his RV continuously.  In 2/15, EF was 50-55% by echo.  By 6/15, EF had fallen to 20-25%.  Cardiolite showed possible scar but no ischemia.  On 09/03/14, he was admitted with dyspnea and found to be in atrial flutter with RLL PNA and with volume overload.  Echo showed EF 15% with diffuse hypokinesis.  He was started on IV Lasix and IV amiodarone. He converted back to NSR.  He subsequently had atrial flutter ablation.  He had right and left heart cath in 9/16, showing nonobstructive CAD and optimized filling pressures with CI 2.09.  He subsequently had upgrade to CRT by Dr Lovena Le.  EF up to 30-35% in 4/17.  Subsequently, the LV lead dislodged.  He was offered lead revision, but preferred watchful waiting. Echo in 5/18 showed EF down to 20%.    Philip Chavez returns for followup today.  He generally feels well.  No dyspnea except with heavy exertion.   No orthopnea/PND.  No chest pain.  No palpitations.  He is in NSR today.  Weight down 5 lbs.    Medtronic device interrogated: Fluid index/impedance stable.  He is having runs of atrial fibrillation but nothing sustained. He is no longer BiV pacing, RV pacing > 99% of the time.    Labs (8/16): K 3.9, creatinine 1.03, AST 106, ALT 173, HCT 45.1, LDL 132 Labs (9/16): K 3.9, creatinine 1.16, HCT 42.5 Labs (10/16): K 3.9, creatinine 1.32, HCT 39.9 Labs (2/17): K 4.1, creatinine 1.26, BNP 107, digoxin 0.3 Labs (07/10/2015): K 3.9 Creatinine 1.23  Labs (1/18): K 4.1, creatinine 1.18, digoxin 0.3 Labs (2/18): K 4, creatinine 1.24, digoxin 0.4 Labs (5/18): K 4, creatinine 1.25, hgb 14.8, digoxin  0.5  PMH: 1. H/o complete heart block: Has Medtronic PPM, placed in 2/15.   2. Cardiomyopathy: Echo (2/15) prior to PPM placement with EF 50-55%.  Echo (6/15) with EF 20-25%,  Cardiolite at that time showed possible scar but no ischemia.  Echo (8/16) with EF 15%, diffuse hypokinesis, mildly dilated RV with normal systolic function, PA systolic pressure 69 mmHg.  LHC/RHC (9/16) with mean RA 4, PA 31/15 mean 20, mean PCWP 8, CI 2.09, 40% mLAD stenosis.  Upgrade to MDT CRT-D in 9/16.  - Echo (4/17) with EF 30-35%.   - LV lead dislodged and loss of BiV pacing.  - Echo (5/18) with EF 20%, mild LV dilation, normal RV size with mildly decreased systolic function, mild AI.  3. CKD 4. 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  5. Atrial tachycardia: paroxysmal. 6. Atrial flutter: s/p ablation in 8/16.  7. OSA 8. Hyperlipidemia 9. Shingles with post-herpetic neuralgia.  10. Atrial fibrillation: Paroxysmal.   FH: Father with CHF, complete heart block with PPM.  Sister with complete heart block and PPM.   SH: Retired Curator, nonsmoker, lives in [place]: All systems reviewed and negative except as per HPI  Current Outpatient Prescriptions  Medication Sig Dispense Refill  . acetaminophen (TYLENOL) 325 MG tablet Take 1-2 tablets (325-650 mg total) by mouth every 4 (four) hours as needed for mild pain.    Marland Kitchen apixaban (  ELIQUIS) 5 MG TABS tablet Take 1 tablet (5 mg total) by mouth 2 (two) times daily. 60 tablet 6  . bisoprolol (ZEBETA) 5 MG tablet Take 1 tablet (5 mg total) by mouth daily. 30 tablet 6  . digoxin (LANOXIN) 0.125 MG tablet Take 1 tablet (125 mcg total) by mouth daily. 30 tablet 5  . furosemide (LASIX) 20 MG tablet Take 1 tablet (20 mg total) by mouth 2 (two) times daily. 60 tablet 5  . isosorbide mononitrate (IMDUR) 30 MG 24 hr tablet Take 1 tablet (30 mg total) by mouth daily. 30 tablet 3  . sacubitril-valsartan (ENTRESTO) 24-26 MG Take 1 tablet by mouth 2 (two) times daily. 60  tablet 5  . simvastatin (ZOCOR) 40 MG tablet Take 1 tablet (40 mg total) by mouth daily. 30 tablet 1  . spironolactone (ALDACTONE) 25 MG tablet Take 1 tablet (25 mg total) by mouth daily. 30 tablet 5  . traMADol (ULTRAM) 50 MG tablet Take 1 tablet (50 mg total) by mouth every 6 (six) hours as needed. 30 tablet 0  . hydrALAZINE (APRESOLINE) 25 MG tablet Take 0.5 tablets (12.5 mg total) by mouth 3 (three) times daily. 45 tablet 3   No current facility-administered medications for this encounter.    BP (!) 100/56   Pulse 62   Wt 208 lb (94.3 kg)   SpO2 98%   BMI 27.44 kg/m  General: NAD  Neck: JVP 7 cm.No thyromegaly or thyroid nodule.  Lungs: Clear to auscultation bilaterally, normal effort.  CV: Nondisplaced PMI. Heart regular rate and rhythm. No S3/S4, no murmur.  No peripheral edema.  No carotid bruit.  Normal pedal pulses.ICD in L upper chest  Abdomen: Soft, nontender, no hepatosplenomegaly, no distention. + bowel sounds  Skin: Intact without lesions or rashes.  Neurologic: Alert and oriented x 3.  Psych: Normal affect. Extremities: No clubbing or cyanosis.  HEENT: Normal.   Assessment/Plan: 1. Chronic systolic CHF: Nonischemic cardiomyopathy.  EF 15% on echo in 8/16.  Patient had borderline reduced EF when PPM was placed in 2/15.  After that, EF fell significantly.  He has a history of complete heart block and father had CHF and CHB with pacemaker, and sounds like sister also had CHB with pacemaker. 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  He had CRT upgrade.  No significant CAD on last cath.  Echo in 4/17 showed improvement in EF to 30-35%.  However, his LV lead has dislodged and he is no longer BiV pacing, now RV pacing > 99% of the time.  Echo in 5/18 showed EF down to 20%.  Symptomatically, he is still doing pretty well (NYHA class II) and no volume overload on exam or by Optivol.  - Continue bisoprolol 5 mg daily.  - Continue  Entresto  24/26 bid, spironolactone 25 daily, Lasix 20 bid.  BMET today.   - Continue digoxin. Get digoxin level today.  - He has not been taking Bidil due to cost.  He is taking Imdur 30 mg daily.  I will have him add hydralazine 12.5 mg tid and can titrate this up.  - Echo in 5/18 showed EF reduced to 20%, possibly due to loss of BiV pacing.  He is interested in LV lead revision, will send him back to Dr. Lovena Le to set this up. - We have discussed genetic evaluation to look for LMNA or SCN5A mutations that could be used for familial screening, he has wanted to hold  off.  2. Atrial flutter: s/p ablation.  3. Atrial fibrillation: Currently in NSR but paroxysmal atrial fibrillation continues to be noted on device interrogation.  - Continue Eliquis, check CBC today. 3. PE/DVT: >1 year ago and associated with surgery.   4. Hyperlipidemia: He has mild CAD.   - No ASA with Eliquis.   - Continue simvastatin 40mg  daily.  5. Complete heart block: Now RV pacing with loss of LV lead.    Loralie Champagne 08/21/2016

## 2016-08-28 ENCOUNTER — Other Ambulatory Visit (HOSPITAL_COMMUNITY): Payer: Self-pay

## 2016-08-28 MED ORDER — SPIRONOLACTONE 25 MG PO TABS: 25.0000 mg | ORAL_TABLET | Freq: Every day | ORAL | 5 refills | Status: DC

## 2016-09-02 MED FILL — BISOPROLOL FUMARATE 5 MG TA: 5 | 30 days supply | Qty: 30 | Fill #1

## 2016-09-05 ENCOUNTER — Other Ambulatory Visit (HOSPITAL_COMMUNITY): Payer: Self-pay | Admitting: Student

## 2016-09-12 ENCOUNTER — Other Ambulatory Visit (HOSPITAL_COMMUNITY): Payer: Self-pay | Admitting: Student

## 2016-09-13 MED FILL — DIGOXIN 0.125 MG TABLET: 125 | 30 days supply | Qty: 30 | Fill #0

## 2016-09-16 ENCOUNTER — Other Ambulatory Visit (HOSPITAL_COMMUNITY): Payer: Self-pay | Admitting: Pharmacist

## 2016-09-16 MED ORDER — SPIRONOLACTONE 25 MG PO TABS: 25.0000 mg | ORAL_TABLET | Freq: Every day | ORAL | 5 refills | Status: DC

## 2016-09-16 MED FILL — SPIRONOLACTONE 25 MG TABLET: 25 | 30 days supply | Qty: 30 | Fill #0

## 2016-09-16 MED FILL — ENTRESTO 24 MG-26 MG TABLET: 24-26 | 30 days supply | Qty: 60 | Fill #0

## 2016-09-16 NOTE — Telephone Encounter (Signed)
Patient called stating that the Outpatient Pharmacy was having trouble filling some of his medications. I have called our Outpatient Pharmacy and the only medication they are not able to fill is his bisoprolol since it was picked up 14 days ago, otherwise they will fill spironolactone, Entresto ($10 with copay card) and digoxin for him today.   [name], PharmD, BCPS, CPP Clinical Pharmacist Pager: [phone] Phone: [phone] 09/16/2016 12:18 PM

## 2016-09-17 ENCOUNTER — Ambulatory Visit (INDEPENDENT_AMBULATORY_CARE_PROVIDER_SITE_OTHER): Payer: Medicare Other

## 2016-09-17 DIAGNOSIS — I5022 Chronic systolic (congestive) heart failure: Secondary | ICD-10-CM

## 2016-09-17 DIAGNOSIS — Z95810 Presence of automatic (implantable) cardiac defibrillator: Secondary | ICD-10-CM | POA: Diagnosis not present

## 2016-09-17 MED FILL — ELIQUIS 5 MG TABLET: 5 | 30 days supply | Qty: 60 | Fill #3

## 2016-09-17 NOTE — Progress Notes (Signed)
EPIC Encounter for ICM Monitoring  Patient Name: Philip Chavez is a 66 y.o. male Date: 09/17/2016 Primary Care Physican: Nicolette Bang, DO Primary [place] Electrophysiologist: Lovena Le Dry Weight:204lbs  RV Pacing: 97.4%   Clinical Status (15-Aug-2016 to 17-Sep-2016) Treated VT/VF 0 episodes  V. Pacing 96.4%  Atrial Pacing 6.9%  Lower Rate 50 bpm Upper Rate 130 bpm AT/AF 2 episodes  Time in AT/AF 0.6 hr/day (2.5%)  Longest AT/AF 19 hours VT-NS (>4 beats, >162 bpm)  -  4  Observations (1) (15-Aug-2016 to 17-Sep-2016)  AT/AF >= 6 hr for 2 days.       Heart Failure questions reviewed, pt asymptomatic. Reported he is feeling great.    Thoracic impedance normal.  Prescribed dosage: Furosemide 20 mg 1 tablet twice a day  Labs: 08/20/2016 Creatinine 1.36, BUN 18, Potassium 4.0, Sodium 136, EGFR 53->60 06/18/2016 Creatinine 1.25, BUN 12, Potassium 4.0, Sodium 137, EGFR 59->60 03/19/2016 Creatinine 1.24, BUN 13, Potassium 4.0, Sodium 135, EGFR 59->60 02/19/2016 Creatinine 1.18, BUN 15, Potassium 4.1, Sodium 136, EGFR >60  11/14/2017Creatinine 1.32, BUN 12, Potassium 4.1, Sodium 136, EGFR 55->60  06/05/2017Creatinine 1.23, BUN 11, Potassium 3.9, Sodium 137, EGFR >60   Recommendations: No changes.   Encouraged to call for fluid symptoms.  Follow-up plan: ICM clinic phone appointment on 10/29/2016.  Office appointment scheduled 11/20/2016 with HF clinic.  Copy of ICM check sent to device physician.   3 month ICM trend: 09/17/2016   1 Year ICM trend:      Rosalene Billings, RN 09/17/2016 11:35 AM

## 2016-09-20 MED FILL — hydrALAZINE HCL 25 MG TABS: 25 | 30 days supply | Qty: 45 | Fill #1

## 2016-09-30 MED FILL — BISOPROLOL FUMARATE 5 MG TA: 5 | 30 days supply | Qty: 30 | Fill #2

## 2016-10-15 MED FILL — DIGOXIN 0.125 MG TABLET: 125 | 30 days supply | Qty: 30 | Fill #1

## 2016-10-15 MED FILL — SPIRONOLACTONE 25 MG TABLET: 25 | 30 days supply | Qty: 30 | Fill #1

## 2016-10-16 MED FILL — ENTRESTO 24 MG-26 MG TABLET: 24-26 | 30 days supply | Qty: 60 | Fill #1

## 2016-10-16 MED FILL — ELIQUIS 5 MG TABLET: 5 | 30 days supply | Qty: 60 | Fill #4

## 2016-10-16 MED FILL — hydrALAZINE HCL 25 MG TABS: 25 | 30 days supply | Qty: 45 | Fill #2

## 2016-10-29 ENCOUNTER — Ambulatory Visit (INDEPENDENT_AMBULATORY_CARE_PROVIDER_SITE_OTHER): Payer: Medicare Other | Admitting: *Deleted

## 2016-10-29 ENCOUNTER — Telehealth: Payer: Self-pay | Admitting: Cardiology

## 2016-10-29 DIAGNOSIS — Z95810 Presence of automatic (implantable) cardiac defibrillator: Secondary | ICD-10-CM

## 2016-10-29 DIAGNOSIS — I428 Other cardiomyopathies: Secondary | ICD-10-CM

## 2016-10-29 DIAGNOSIS — I5022 Chronic systolic (congestive) heart failure: Secondary | ICD-10-CM

## 2016-10-29 DIAGNOSIS — I442 Atrioventricular block, complete: Secondary | ICD-10-CM

## 2016-10-29 NOTE — Progress Notes (Signed)
Remote ICD transmission.   

## 2016-10-29 NOTE — Telephone Encounter (Signed)
Spoke with pt and reminded pt of remote transmission that is due today. Pt verbalized understanding.   

## 2016-10-29 NOTE — Progress Notes (Signed)
EPIC Encounter for ICM Monitoring  Patient Name: Philip Chavez is a 66 y.o. male Date: 10/29/2016 Primary Care Physican: Nicolette Bang, DO Primary [place] Electrophysiologist: Lovena Le Dry Weight:204lbs  RV Pacing: 97%   Event Summary AT/AF Daily Burden > Threshold  ?11 V. Sensing Episodes  ?4 VT-NS  ?24 hours in AT/AF Since Last Session  Clinical Status (17-Sep-2016 to 29-Oct-2016) AT/AF 2 episodes  Time in AT/AF 0.6 hr/day (2.3%)       Heart Failure questions reviewed, pt asymptomatic.   Thoracic impedance normal.  Prescribed dosage: Furosemide 20 mg 1 tablet twice a day  Labs: 08/20/2016 Creatinine 1.36, BUN 18, Potassium 4.0, Sodium 136, EGFR 53->60 06/18/2016 Creatinine 1.25, BUN 12, Potassium 4.0, Sodium 137, EGFR 59->60 03/19/2016 Creatinine 1.24, BUN 13, Potassium 4.0, Sodium 135, EGFR 59->60 02/19/2016 Creatinine 1.18, BUN 15, Potassium 4.1, Sodium 136, EGFR >60  11/14/2017Creatinine 1.32, BUN 12, Potassium 4.1, Sodium 136, EGFR 55->60  06/05/2017Creatinine 1.23, BUN 11, Potassium 3.9, Sodium 137, EGFR >60   Recommendations: No changes.  Encouraged to call for fluid symptoms.  Follow-up plan: ICM clinic phone appointment on 11/29/2016.  Office appointment scheduled 11/20/2016 with HF clinic.  Copy of ICM check sent to Dr. Lovena Le.   3 month ICM trend: 10/29/2016   1 Year ICM trend:      Rosalene Billings, RN 10/29/2016 1:23 PM

## 2016-10-30 MED FILL — BISOPROLOL FUMARATE 5 MG TA: 5 | 30 days supply | Qty: 30 | Fill #3

## 2016-10-31 ENCOUNTER — Encounter: Payer: Self-pay | Admitting: Cardiology

## 2016-10-31 LAB — CUP PACEART REMOTE DEVICE CHECK
Battery Remaining Longevity: 68 mo
Battery Voltage: 2.93 V
Brady Statistic AP VP Percent: 5.48 %
Brady Statistic AP VS Percent: 0 %
Brady Statistic AS VS Percent: 2.21 %
Brady Statistic RV Percent Paced: 97 %
Date Time Interrogation Session: 20180925151406
HighPow Impedance: 59 Ohm
Implantable Lead Implant Date: 20160919
Implantable Lead Location: 753860
Implantable Lead Model: 4598
Lead Channel Impedance Value: 304 Ohm
Lead Channel Impedance Value: 342 Ohm
Lead Channel Impedance Value: 342 Ohm
Lead Channel Impedance Value: 380 Ohm
Lead Channel Impedance Value: 380 Ohm
Lead Channel Impedance Value: 437 Ohm
Lead Channel Impedance Value: 532 Ohm
Lead Channel Pacing Threshold Amplitude: 0.875 V
Lead Channel Pacing Threshold Amplitude: 2.5 V
Lead Channel Sensing Intrinsic Amplitude: 3.375 mV
Lead Channel Sensing Intrinsic Amplitude: 3.375 mV
Lead Channel Sensing Intrinsic Amplitude: 8 mV
Lead Channel Setting Pacing Amplitude: 2 V
Lead Channel Setting Pacing Amplitude: 2 V
Lead Channel Setting Pacing Pulse Width: 0.4 ms
Lead Channel Setting Sensing Sensitivity: 0.3 mV
MDC IDC LEAD IMPLANT DT: 20150209
MDC IDC LEAD IMPLANT DT: 20160919
MDC IDC LEAD LOCATION: 753858
MDC IDC LEAD LOCATION: 753859
MDC IDC MSMT LEADCHNL LV IMPEDANCE VALUE: 323 Ohm
MDC IDC MSMT LEADCHNL LV IMPEDANCE VALUE: 532 Ohm
MDC IDC MSMT LEADCHNL LV IMPEDANCE VALUE: 532 Ohm
MDC IDC MSMT LEADCHNL LV IMPEDANCE VALUE: 532 Ohm
MDC IDC MSMT LEADCHNL LV IMPEDANCE VALUE: 589 Ohm
MDC IDC MSMT LEADCHNL LV PACING THRESHOLD PULSEWIDTH: 0.4 ms
MDC IDC MSMT LEADCHNL RA PACING THRESHOLD AMPLITUDE: 1 V
MDC IDC MSMT LEADCHNL RA PACING THRESHOLD PULSEWIDTH: 0.4 ms
MDC IDC MSMT LEADCHNL RV IMPEDANCE VALUE: 323 Ohm
MDC IDC MSMT LEADCHNL RV PACING THRESHOLD PULSEWIDTH: 0.4 ms
MDC IDC MSMT LEADCHNL RV SENSING INTR AMPL: 8 mV
MDC IDC PG IMPLANT DT: 20160919
MDC IDC STAT BRADY AS VP PERCENT: 92.31 %
MDC IDC STAT BRADY RA PERCENT PACED: 5.27 %

## 2016-11-15 MED FILL — SPIRONOLACTONE 25 MG TABLET: 25 | 30 days supply | Qty: 30 | Fill #2

## 2016-11-15 MED FILL — DIGOXIN 0.125 MG TABLET: 125 | 30 days supply | Qty: 30 | Fill #2

## 2016-11-15 MED FILL — ENTRESTO 24 MG-26 MG TABLET: 24-26 | 30 days supply | Qty: 60 | Fill #2

## 2016-11-18 MED FILL — ELIQUIS 5 MG TABLET: 5 | 30 days supply | Qty: 60 | Fill #5

## 2016-11-20 ENCOUNTER — Encounter (HOSPITAL_COMMUNITY): Payer: Self-pay

## 2016-11-20 ENCOUNTER — Ambulatory Visit (HOSPITAL_COMMUNITY)
Admission: RE | Admit: 2016-11-20 | Discharge: 2016-11-20 | Disposition: A | Discharge status: Home / Self Care | Payer: Medicare Other | Source: Ambulatory Visit | Attending: Cardiology | Admitting: Cardiology

## 2016-11-20 VITALS — BP 108/56 | HR 76 | Wt 213.0 lb

## 2016-11-20 DIAGNOSIS — I2510 Atherosclerotic heart disease of native coronary artery without angina pectoris: Secondary | ICD-10-CM | POA: Insufficient documentation

## 2016-11-20 DIAGNOSIS — N189 Chronic kidney disease, unspecified: Secondary | ICD-10-CM | POA: Diagnosis not present

## 2016-11-20 DIAGNOSIS — Z79899 Other long term (current) drug therapy: Secondary | ICD-10-CM | POA: Diagnosis not present

## 2016-11-20 DIAGNOSIS — I4892 Unspecified atrial flutter: Secondary | ICD-10-CM

## 2016-11-20 DIAGNOSIS — Z8249 Family history of ischemic heart disease and other diseases of the circulatory system: Secondary | ICD-10-CM | POA: Diagnosis not present

## 2016-11-20 DIAGNOSIS — Z7902 Long term (current) use of antithrombotics/antiplatelets: Secondary | ICD-10-CM | POA: Diagnosis not present

## 2016-11-20 DIAGNOSIS — I5022 Chronic systolic (congestive) heart failure: Secondary | ICD-10-CM | POA: Insufficient documentation

## 2016-11-20 DIAGNOSIS — Z9889 Other specified postprocedural states: Secondary | ICD-10-CM | POA: Diagnosis not present

## 2016-11-20 DIAGNOSIS — G4733 Obstructive sleep apnea (adult) (pediatric): Secondary | ICD-10-CM | POA: Insufficient documentation

## 2016-11-20 DIAGNOSIS — Z950 Presence of cardiac pacemaker: Secondary | ICD-10-CM | POA: Diagnosis not present

## 2016-11-20 DIAGNOSIS — E785 Hyperlipidemia, unspecified: Secondary | ICD-10-CM | POA: Diagnosis not present

## 2016-11-20 DIAGNOSIS — I5042 Chronic combined systolic (congestive) and diastolic (congestive) heart failure: Secondary | ICD-10-CM

## 2016-11-20 DIAGNOSIS — I428 Other cardiomyopathies: Secondary | ICD-10-CM

## 2016-11-20 DIAGNOSIS — Z86718 Personal history of other venous thrombosis and embolism: Secondary | ICD-10-CM | POA: Diagnosis not present

## 2016-11-20 DIAGNOSIS — I480 Paroxysmal atrial fibrillation: Secondary | ICD-10-CM | POA: Insufficient documentation

## 2016-11-20 DIAGNOSIS — I442 Atrioventricular block, complete: Secondary | ICD-10-CM | POA: Diagnosis not present

## 2016-11-20 LAB — BASIC METABOLIC PANEL
ANION GAP: 7 (ref 5–15)
BUN: 14 mg/dL (ref 6–20)
CALCIUM: 9 mg/dL (ref 8.9–10.3)
CO2: 25 mmol/L (ref 22–32)
Chloride: 104 mmol/L (ref 101–111)
Creatinine, Ser: 1.24 mg/dL (ref 0.61–1.24)
GFR, EST NON AFRICAN AMERICAN: 59 mL/min — AB (ref >60)
Glucose, Bld: 147 mg/dL — ABNORMAL HIGH (ref 65–99)
POTASSIUM: 4 mmol/L (ref 3.5–5.1)
Sodium: 136 mmol/L (ref 135–145)

## 2016-11-20 LAB — DIGOXIN LEVEL: Digoxin Level: 0.2 ng/mL — ABNORMAL LOW (ref 0.8–2.0)

## 2016-11-20 NOTE — Patient Instructions (Addendum)
Routine lab work today. Will notify you of abnormal results, otherwise no news is good news!  Will refer you to electrophysiology at [facility]. Address: [address] ([place]), [place], [place] [address]  Phone: [phone] They will contact you by phon to schedule this appointment.  Follow up 4 months with Dr. Aundra Dubin. We will call you closer to this time, or you may call our office to schedule 1 month before you are due to be seen. Take all medication as prescribed the day of your appointment. Bring all medications with you to your appointment.  Do the following things EVERYDAY: 1) Weigh yourself in the morning before breakfast. Write it down and keep it in a log. 2) Take your medicines as prescribed 3) Eat low salt foods-Limit salt (sodium) to 2000 mg per day.  4) Stay as active as you can everyday 5) Limit all fluids for the day to less than 2 liters

## 2016-11-20 NOTE — Progress Notes (Signed)
Patient ID: Philip Chavez, male   DOB: [birth year], 66 y.o.   MRN: [id number] PCP: Dr. Gerlean Ren EP: Dr. Lovena Le HF cardiologist: Dr Aundra Dubin  66 yo with history of complete heart block/Medtronic PPM, cardiomyopathy, and atrial flutter s/p ablation presents for CHF clinic evaluation.  Patient developed complete heart block in 2/15 and had PPM placed at that time.  He paces his RV continuously.  In 2/15, EF was 50-55% by echo.  By 6/15, EF had fallen to 20-25%.  Cardiolite showed possible scar but no ischemia.  On 09/03/14, he was admitted with dyspnea and found to be in atrial flutter with RLL PNA and with volume overload.  Echo showed EF 15% with diffuse hypokinesis.  He was started on IV Lasix and IV amiodarone. He converted back to NSR.  He subsequently had atrial flutter ablation.  He had right and left heart cath in 9/16, showing nonobstructive CAD and optimized filling pressures with CI 2.09.  He subsequently had upgrade to CRT by Dr Lovena Le.  EF up to 30-35% in 4/17.  Subsequently, the LV lead dislodged.  He was offered lead revision, but preferred watchful waiting. Echo in 5/18 showed EF down to 20%.    Pt presents today for regular follow up. Overall he feels "great". Denies dyspnea except for heavy exertion, which he feels is WNL for someone. Denies orthopnea or PND. No CP or palpitations. Weight up 5 lbs from visit in July, but does not feel like fluid is elevated.  He spoke with Dr. Lovena Le about BiV lead revision, and was told he would likely have it done prior to December, but has not heard back.   Medtronic device interrogated: Device would not send today. Interrogation from 10/29/16 reviewed. 2.3% AT/AF burden, Normal fluid. RV Pacing >97% of the time.   Labs (8/16): K 3.9, creatinine 1.03, AST 106, ALT 173, HCT 45.1, LDL 132 Labs (9/16): K 3.9, creatinine 1.16, HCT 42.5 Labs (10/16): K 3.9, creatinine 1.32, HCT 39.9 Labs (2/17): K 4.1, creatinine 1.26, BNP 107, digoxin 0.3 Labs (07/10/2015): K 3.9  Creatinine 1.23  Labs (1/18): K 4.1, creatinine 1.18, digoxin 0.3 Labs (2/18): K 4, creatinine 1.24, digoxin 0.4 Labs (5/18): K 4, creatinine 1.25, hgb 14.8, digoxin 0.5  PMH: 1. H/o complete heart block: Has Medtronic PPM, placed in 2/15.   2. Cardiomyopathy: Echo (2/15) prior to PPM placement with EF 50-55%.  Echo (6/15) with EF 20-25%,  Cardiolite at that time showed possible scar but no ischemia.  Echo (8/16) with EF 15%, diffuse hypokinesis, mildly dilated RV with normal systolic function, PA systolic pressure 69 mmHg.  LHC/RHC (9/16) with mean RA 4, PA 31/15 mean 20, mean PCWP 8, CI 2.09, 40% mLAD stenosis.  Upgrade to MDT CRT-D in 9/16.  - Echo (4/17) with EF 30-35%.   - LV lead dislodged and loss of BiV pacing.  - Echo (5/18) with EF 20%, mild LV dilation, normal RV size with mildly decreased systolic function, mild AI.  3. CKD 4. 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  5. Atrial tachycardia: paroxysmal. 6. Atrial flutter: s/p ablation in 8/16.  7. OSA 8. Hyperlipidemia 9. Shingles with post-herpetic neuralgia.  10. Atrial fibrillation: Paroxysmal.   FH: Father with CHF, complete heart block with PPM.  Sister with complete heart block and PPM.   SH: Retired Curator, nonsmoker, lives in [place]: All systems reviewed and negative except as per HPI  Current Outpatient Prescriptions  Medication Sig Dispense Refill  . acetaminophen (  TYLENOL) 325 MG tablet Take 1-2 tablets (325-650 mg total) by mouth every 4 (four) hours as needed for mild pain.    Marland Kitchen apixaban (ELIQUIS) 5 MG TABS tablet Take 1 tablet (5 mg total) by mouth 2 (two) times daily. 60 tablet 6  . bisoprolol (ZEBETA) 5 MG tablet Take 1 tablet (5 mg total) by mouth daily. 30 tablet 6  . digoxin (LANOXIN) 0.125 MG tablet TAKE 1 TABLET BY MOUTH DAILY. 30 tablet 5  . ENTRESTO 24-26 MG TAKE 1 TABLET BY MOUTH 2 (TWO) TIMES DAILY. 60 tablet 5  . furosemide (LASIX) 20 MG tablet Take 1 tablet (20 mg total) by mouth 2 (two)  times daily. 60 tablet 5  . hydrALAZINE (APRESOLINE) 25 MG tablet Take 0.5 tablets (12.5 mg total) by mouth 3 (three) times daily. 45 tablet 3  . isosorbide mononitrate (IMDUR) 30 MG 24 hr tablet Take 1 tablet (30 mg total) by mouth daily. 30 tablet 3  . simvastatin (ZOCOR) 40 MG tablet Take 1 tablet (40 mg total) by mouth daily. 30 tablet 1  . spironolactone (ALDACTONE) 25 MG tablet Take 1 tablet (25 mg total) by mouth daily. 30 tablet 5  . traMADol (ULTRAM) 50 MG tablet Take 1 tablet (50 mg total) by mouth every 6 (six) hours as needed. 30 tablet 0   No current facility-administered medications for this encounter.    Vitals:   11/20/16 1334  BP: (!) 108/56  Pulse: 76  SpO2: 98%  Weight: 213 lb (96.6 kg)   Wt Readings from Last 3 Encounters:  11/20/16 213 lb (96.6 kg)  08/20/16 208 lb (94.3 kg)  07/15/16 211 lb (95.7 kg)   Physical Exam General: Well appearing. No resp difficulty. HEENT: Normal Neck: Supple. JVP 6-7. Carotids 2+ bilat; no bruits. No thyromegaly or nodule noted. Cor: PMI nondisplaced. RRR, No M/G/R noted. L chest ICD.  Lungs: CTAB, normal effort. Abdomen: Soft, non-tender, non-distended, no HSM. No bruits or masses. +BS  Extremities: No cyanosis, clubbing, or rash. R and LLE no edema.  Neuro: Alert & orientedx3, cranial nerves grossly intact. moves all 4 extremities w/o difficulty. Affect pleasant   Assessment/Plan: 1. Chronic systolic CHF: Nonischemic cardiomyopathy.  EF 15% on echo in 8/16.  Patient had borderline reduced EF when PPM was placed in 2/15.  After that, EF fell significantly.  He has a history of complete heart block and father had CHF and CHB with pacemaker, and sounds like sister also had CHB with pacemaker. 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  He had CRT upgrade.  No significant CAD on last cath.  Echo in 4/17 showed improvement in EF to 30-35%.  However, his LV lead has dislodged and he is no  longer BiV pacing, now RV pacing > 99% of the time.  Echo in 5/18 showed EF down to 20%.  - NYHA Class II and volume status stable on exam.  - Continue bisoprolol 5 mg daily.  - Continue  Entresto 24/26 bid - Continue spironolactone 25 daily - Continue Lasix 20 bid.  BMET today.  - Continue digoxin 0.125 mg daily. Check level today.  - Continue hydralazine 12.5 mg TID - Continue imdur 30 mg daily.  - Echo in 5/18 showed EF reduced to 20%, possibly due to loss of BiV pacing.  He is interested in LV lead revision. Will send back to Dr. Lovena Le to set this up.  - We have discussed genetic evaluation to look for  LMNA or SCN5A mutations that could be used for familial screening. He wants to hold off.  2. Atrial flutter: s/p ablation.  3. Atrial fibrillation:  - NSR on exam - Device interrogation shows very low burden of Afib.  - Continue Eliquis. Denies bleeding. CBC today.   3. PE/DVT: >1 year ago and associated with surgery.   4. Hyperlipidemia: He has mild CAD.   - No ASA with Eliquis.   - Continue simvastatin 40mg  daily.  5. Complete heart block: Now RV pacing with loss of LV lead.   Overall he is doing great. Needs EP follow up. Labs today. RTC 3-4 months. Sooner with symptoms.   Shirley Friar, PA-C  11/20/2016

## 2016-11-22 MED FILL — hydrALAZINE HCL 25 MG TABS: 25 | 30 days supply | Qty: 45 | Fill #3

## 2016-11-28 MED FILL — BISOPROLOL FUMARATE 5 MG TA: 5 | 30 days supply | Qty: 30 | Fill #4

## 2016-11-29 ENCOUNTER — Telehealth: Payer: Self-pay | Admitting: Cardiology

## 2016-11-29 ENCOUNTER — Ambulatory Visit (INDEPENDENT_AMBULATORY_CARE_PROVIDER_SITE_OTHER): Payer: Medicare Other

## 2016-11-29 DIAGNOSIS — Z95810 Presence of automatic (implantable) cardiac defibrillator: Secondary | ICD-10-CM | POA: Diagnosis not present

## 2016-11-29 DIAGNOSIS — I5042 Chronic combined systolic (congestive) and diastolic (congestive) heart failure: Secondary | ICD-10-CM | POA: Diagnosis not present

## 2016-11-29 NOTE — Progress Notes (Signed)
EPIC Encounter for ICM Monitoring  Patient Name: Philip Chavez is a 66 y.o. male Date: 11/29/2016 Primary Care Physican: Nicolette Bang, DO Primary [place] Electrophysiologist: Lovena Le Dry Weight:203lbs  RV Pacing: 95.7%    Event Summary ?4 V. Sensing Episodes  ?1 VT-NS  ?10 seconds in AT/AF Since Last Session      Heart Failure questions reviewed, pt asymptomatic.   Thoracic impedance normal.  Prescribed dosage: Furosemide 20 mg 1 tablet twice a day  Labs: 11/20/2016 Creatinine 1.24, BUN 14, Potassium 4.0, Sodium 136, EGFR 59->60 08/20/2016 Creatinine 1.36, BUN 18, Potassium 4.0, Sodium 136, EGFR 53->60 06/18/2016 Creatinine 1.25, BUN 12, Potassium 4.0, Sodium 137, EGFR 59->60 03/19/2016 Creatinine 1.24, BUN 13, Potassium 4.0, Sodium 135, EGFR 59->60 02/19/2016 Creatinine 1.18, BUN 15, Potassium 4.1, Sodium 136, EGFR >60  11/14/2017Creatinine 1.32, BUN 12, Potassium 4.1, Sodium 136, EGFR 55->60  06/05/2017Creatinine 1.23, BUN 11, Potassium 3.9, Sodium 137, EGFR >60  Recommendations: No changes.   Encouraged to call for fluid symptoms.  Follow-up plan: ICM clinic phone appointment on 01/06/2017.  Office appointment scheduled 12/04/2016 with Dr. Lovena Le.  Copy of ICM check sent to Dr. Lovena Le.   3 month ICM trend: 11/29/2016   1 Year ICM trend:      Rosalene Billings, RN 11/29/2016 10:38 AM

## 2016-11-29 NOTE — Telephone Encounter (Signed)
Spoke with pt and reminded pt of remote transmission that is due today. Pt verbalized understanding.   

## 2016-12-04 ENCOUNTER — Encounter: Payer: Self-pay | Admitting: Internal Medicine

## 2016-12-04 ENCOUNTER — Ambulatory Visit (INDEPENDENT_AMBULATORY_CARE_PROVIDER_SITE_OTHER): Payer: Medicare Other | Admitting: Internal Medicine

## 2016-12-04 VITALS — BP 102/62 | HR 70 | Ht 73.0 in | Wt 217.0 lb

## 2016-12-04 DIAGNOSIS — Z95810 Presence of automatic (implantable) cardiac defibrillator: Secondary | ICD-10-CM

## 2016-12-04 DIAGNOSIS — I442 Atrioventricular block, complete: Secondary | ICD-10-CM | POA: Diagnosis not present

## 2016-12-04 DIAGNOSIS — I4892 Unspecified atrial flutter: Secondary | ICD-10-CM

## 2016-12-04 NOTE — Progress Notes (Signed)
HPI [name] returns today for ongoing evaluation and management of his ICD. The patient is a 66 year old man with a history of complete heart block, paroxysmal atrial flutter status post ablation, paroxysmal atrial fibrillation, who underwent upgrade to a biventricular ICD several years ago. The patient is very physically fit and very physically active, and developed dislodgment of his left ventricular lead back into the coronary sinus. The lead became nonfunctional. Since then, he is been paced RV only with acceptable atrial sensing. He has had no ICD shocks. He denies chest pain. No syncope. He remains active working. No Known Allergies   Current Outpatient Prescriptions  Medication Sig Dispense Refill  . acetaminophen (TYLENOL) 325 MG tablet Take 1-2 tablets (325-650 mg total) by mouth every 4 (four) hours as needed for mild pain.    Marland Kitchen apixaban (ELIQUIS) 5 MG TABS tablet Take 1 tablet (5 mg total) by mouth 2 (two) times daily. 60 tablet 6  . bisoprolol (ZEBETA) 5 MG tablet Take 1 tablet (5 mg total) by mouth daily. 30 tablet 6  . digoxin (LANOXIN) 0.125 MG tablet TAKE 1 TABLET BY MOUTH DAILY. 30 tablet 5  . ENTRESTO 24-26 MG TAKE 1 TABLET BY MOUTH 2 (TWO) TIMES DAILY. 60 tablet 5  . furosemide (LASIX) 20 MG tablet Take 1 tablet (20 mg total) by mouth 2 (two) times daily. 60 tablet 5  . hydrALAZINE (APRESOLINE) 25 MG tablet Take 0.5 tablets (12.5 mg total) by mouth 3 (three) times daily. 45 tablet 3  . isosorbide mononitrate (IMDUR) 30 MG 24 hr tablet Take 1 tablet (30 mg total) by mouth daily. 30 tablet 3  . simvastatin (ZOCOR) 40 MG tablet Take 1 tablet (40 mg total) by mouth daily. 30 tablet 1  . spironolactone (ALDACTONE) 25 MG tablet Take 1 tablet (25 mg total) by mouth daily. 30 tablet 5  . traMADol (ULTRAM) 50 MG tablet Take 1 tablet (50 mg total) by mouth every 6 (six) hours as needed. 30 tablet 0   No current facility-administered medications for this visit.      Past  Medical History:  Diagnosis Date  . AICD (automatic cardioverter/defibrillator) present 0919/2016   BIV   . Atrial flutter ([place])    a. 08/2014 presented w/ aflutter->converted on amio;  b. CHA2DS2VASc = 2 -->Xarelto.  . Atrial tachycardia ([place])   . Chronic combined systolic and diastolic CHF (congestive heart failure) ([place])    a. 03/2013 Echo EF 50-55%;  b. 07/2013 Echo EF 20-25%, diff HK, Gr3 DD;  c. 08/2014 Echo: EF 15%, diff HK, mild AI/MR, mildly dil LA/RV, mod TR, PASP 13mmHg.  Marland Kitchen DVT (deep venous thrombosis) ([place])    a. 2007 RLE: S/P ankle surgery;  b. 03/2013 LLE DVT & PE-->Xarelto.  . Embolism, pulmonary with infarction ([place])    a. 2007 RLE: S/P ankle surgery;  b. 03/2013 LLE DVT & PE-->Xarelto.  . Hypercholesterolemia   . Hypercoagulable state ([place])   . Hypertension   . Musculoskeletal neck pain   . Nonischemic cardiomyopathy ([place])    a. 07/2013 Echo: EF 20-25%;  b. 07/2013 Myoview: Large inferior, lateral, apical scar w/ HK, no ischemia, EF 17%;  b. 08/2014 Echo: EF 15%, diff HK.  Marland Kitchen Pneumonia ~ 04/2014; 09/02/2014  . Presence of permanent cardiac pacemaker    a. 03/2013 s/p MDT ADDRL1 Adapta DC PPM, ser # [id number].  . Sleep apnea    does not wear mask (09/02/2014)  . Third degree heart block ([place])  a. 03/2013 s/p MDT ADDRL1 Adapta DC PPM, ser # [id number] H.    ROS:   All systems reviewed and negative except as noted in the HPI.   Past Surgical History:  Procedure Laterality Date  . CARDIAC CATHETERIZATION N/A 10/17/2014   Procedure: Right/Left Heart Cath and Coronary Angiography;  Surgeon: Larey Dresser, MD;  Location: [facility];  Service: Cardiovascular;  Laterality: N/A;  . ELECTROPHYSIOLOGIC STUDY N/A 09/07/2014   Procedure: A-Flutter;  Surgeon: Evans Lance, MD;  Location: [facility];  Service: Cardiovascular;  Laterality: N/A;  . EP IMPLANTABLE DEVICE  10/24/2014   BIV  . EP IMPLANTABLE DEVICE N/A 10/24/2014   Procedure: BiV ICD Upgrade;  Surgeon: Evans Lance, MD;  Location: [facility];  Service: Cardiovascular;  Laterality: N/A;  . FOOT FRACTURE SURGERY Right 2007  . FRACTURE SURGERY    . INGUINAL HERNIA REPAIR Right 1980's  . INSERT / REPLACE / REMOVE PACEMAKER     MDT ADDRL1 pacemaker implanted by Dr Lovena Le for complete heart block  . PERMANENT PACEMAKER INSERTION N/A 03/15/2013   Procedure: PERMANENT PACEMAKER INSERTION;  Surgeon: Evans Lance, MD;  Location: [facility];  Service: Cardiovascular;  Laterality: N/A;     Family History  Problem Relation Age of Onset  . Diabetes type II Mother   . Heart disease Father   . Arrhythmia Father   . Arrhythmia Sister   . Hypertension Neg Hx   . Heart attack Neg Hx   . Stroke Neg Hx      Social History   Social History  . Marital status: Married    Spouse name: N/A  . Number of children: N/A  . Years of education: N/A   Occupational History  . Not on file.   Social History Main Topics  . Smoking status: Former Smoker    Packs/day: 1.50    Years: 10.00    Types: Cigarettes  . Smokeless tobacco: Never Used     Comment: 09/02/2014  "quit smoking 20-30 yr ago"  . Alcohol use No  . Drug use: No  . Sexual activity: Yes   Other Topics Concern  . Not on file   Social History Narrative  . No narrative on file     BP 102/62   Pulse 70   Ht 6\' 1"  (1.854 m)   Physical Exam:  Well appearing middle-aged man, looks younger than his stated age, NAD HEENT: Unremarkable Neck:  6 cm JVD, no thyromegally Lymphatics:  No adenopathy Back:  No CVA tenderness Lungs:  Clear, with no wheezes, rales, or rhonchi HEART:  Regular rate rhythm, no murmurs, no rubs, no clicks Abd:  soft, positive bowel sounds, no organomegally, no rebound, no guarding Ext:  2 plus pulses, no edema, no cyanosis, no clubbing Skin:  No rashes no nodules Neuro:  CN II through XII intact, motor grossly intact  EKG normal sinus rhythm with P synchronous ventricular pacing -   DEVICE  Normal  device function.  See PaceArt for details.   Assess/Plan: 1. Complete heart block - the patient is status post ICD and asymptomatic. 2. Chronic systolic heart failure - the patient states that since his LV lead was turned off, he can tell no difference between bi-V pacing and RV only pacing. 3. ICD - his Medtronic biventricular ICD is programmed dual-chamber pacing with RV only pacing. His LV lead is turned off. 4. Paroxysmal atrial fibrillation - he is maintaining normal sinus  rhythm approximately 96% of the time. No change in medical therapy at this time.  [name], M.D.

## 2016-12-04 NOTE — Patient Instructions (Addendum)
Medication Instructions:  Your physician recommends that you continue on your current medications as directed. Please refer to the Current Medication list given to you today.  Labwork: None ordered.  Testing/Procedures: None ordered.  Follow-Up: Your physician wants you to follow-up in: one year with Dr. Lovena Le.   You will receive a reminder letter in the mail two months in advance. If you don't receive a letter, please call our office to schedule the follow-up appointment.  Remote monitoring is used to monitor your ICD from home. This monitoring reduces the number of office visits required to check your device to one time per year. It allows [place] to keep an eye on the functioning of your device to ensure it is working properly. You are scheduled for a device check from home on 12/262018. You may send your transmission at any time that day. If you have a wireless device, the transmission will be sent automatically. After your physician reviews your transmission, you will receive a postcard with your next transmission date.    Any Other Special Instructions Will Be Listed Below (If Applicable).     If you need a refill on your cardiac medications before your next appointment, please call your pharmacy.

## 2016-12-13 LAB — CUP PACEART INCLINIC DEVICE CHECK
Battery Remaining Longevity: 61 mo
Battery Voltage: 2.97 V
Brady Statistic AP VP Percent: 6.14 %
Brady Statistic AP VS Percent: 0 %
Brady Statistic AS VP Percent: 91.31 %
Brady Statistic RA Percent Paced: 5.79 %
HighPow Impedance: 55 Ohm
Implantable Lead Implant Date: 20160919
Implantable Lead Location: 753859
Implantable Lead Model: 4598
Implantable Lead Model: 6935
Lead Channel Impedance Value: 304 Ohm
Lead Channel Impedance Value: 304 Ohm
Lead Channel Impedance Value: 323 Ohm
Lead Channel Impedance Value: 380 Ohm
Lead Channel Impedance Value: 399 Ohm
Lead Channel Impedance Value: 513 Ohm
Lead Channel Impedance Value: 513 Ohm
Lead Channel Pacing Threshold Amplitude: 1 V
Lead Channel Pacing Threshold Pulse Width: 0.4 ms
Lead Channel Pacing Threshold Pulse Width: 0.4 ms
Lead Channel Pacing Threshold Pulse Width: 0.4 ms
Lead Channel Sensing Intrinsic Amplitude: 14.375 mV
Lead Channel Setting Pacing Amplitude: 1.75 V
Lead Channel Setting Pacing Amplitude: 2 V
Lead Channel Setting Pacing Pulse Width: 0.4 ms
Lead Channel Setting Sensing Sensitivity: 0.3 mV
MDC IDC LEAD IMPLANT DT: 20150209
MDC IDC LEAD IMPLANT DT: 20160919
MDC IDC LEAD LOCATION: 753858
MDC IDC LEAD LOCATION: 753860
MDC IDC MSMT LEADCHNL LV IMPEDANCE VALUE: 304 Ohm
MDC IDC MSMT LEADCHNL LV IMPEDANCE VALUE: 342 Ohm
MDC IDC MSMT LEADCHNL LV IMPEDANCE VALUE: 532 Ohm
MDC IDC MSMT LEADCHNL LV IMPEDANCE VALUE: 532 Ohm
MDC IDC MSMT LEADCHNL LV IMPEDANCE VALUE: 589 Ohm
MDC IDC MSMT LEADCHNL LV PACING THRESHOLD AMPLITUDE: 2.5 V
MDC IDC MSMT LEADCHNL RA IMPEDANCE VALUE: 399 Ohm
MDC IDC MSMT LEADCHNL RA PACING THRESHOLD AMPLITUDE: 1 V
MDC IDC MSMT LEADCHNL RA SENSING INTR AMPL: 5.25 mV
MDC IDC PG IMPLANT DT: 20160919
MDC IDC SESS DTM: 20181031185538
MDC IDC STAT BRADY AS VS PERCENT: 2.55 %
MDC IDC STAT BRADY RV PERCENT PACED: 96.59 %

## 2016-12-16 MED FILL — SPIRONOLACTONE 25 MG TABLET: 25 | 30 days supply | Qty: 30 | Fill #3

## 2016-12-16 MED FILL — ELIQUIS 5 MG TABLET: 5 | 30 days supply | Qty: 60 | Fill #6

## 2016-12-16 MED FILL — DIGOXIN 0.125 MG TABLET: 125 | 30 days supply | Qty: 30 | Fill #3

## 2016-12-16 MED FILL — ENTRESTO 24 MG-26 MG TABLET: 24-26 | 30 days supply | Qty: 60 | Fill #3

## 2016-12-17 ENCOUNTER — Telehealth: Payer: Self-pay

## 2016-12-17 DIAGNOSIS — Z23 Encounter for immunization: Secondary | ICD-10-CM | POA: Diagnosis not present

## 2016-12-17 NOTE — Telephone Encounter (Signed)
Returned patient call regarding flu shot availability.  Advised patient to contact PCP office or local drugstores also provide flu shot. Advised to check insurance coverage for the vaccination.

## 2016-12-23 ENCOUNTER — Other Ambulatory Visit: Payer: Self-pay | Admitting: Internal Medicine

## 2016-12-23 MED ORDER — FUROSEMIDE 20 MG PO TABS: 20.0000 mg | ORAL_TABLET | Freq: Two times a day (BID) | ORAL | 5 refills | Status: DC

## 2016-12-25 ENCOUNTER — Other Ambulatory Visit (HOSPITAL_COMMUNITY): Payer: Self-pay | Admitting: Cardiology

## 2016-12-25 MED FILL — hydrALAZINE HCL 25 MG TABS: 25 | 30 days supply | Qty: 45 | Fill #0

## 2016-12-30 MED FILL — BISOPROLOL FUMARATE 5 MG TA: 5 | 30 days supply | Qty: 30 | Fill #5

## 2017-01-06 ENCOUNTER — Ambulatory Visit (INDEPENDENT_AMBULATORY_CARE_PROVIDER_SITE_OTHER): Payer: Medicare Other

## 2017-01-06 DIAGNOSIS — Z95810 Presence of automatic (implantable) cardiac defibrillator: Secondary | ICD-10-CM | POA: Diagnosis not present

## 2017-01-06 DIAGNOSIS — I5042 Chronic combined systolic (congestive) and diastolic (congestive) heart failure: Secondary | ICD-10-CM

## 2017-01-06 NOTE — Progress Notes (Addendum)
EPIC Encounter for ICM Monitoring  Patient Name: Philip Chavez is a 66 y.o. male Date: 01/06/2017 Primary Care Physican: Wallace, Catherine Lauren, DO Primary Cardiologist:[place] Electrophysiologist: Taylor Dry Weight:Previous weight 203lbs  RV Pacing: 95.4%     Clinical Status (04-Dec-2016 to 06-Jan-2017) Treated VT/VF 0 episodes  AT/AF 4 episodes  Time in AT/AF 0.3 hr/day (1.2%)   Event Summary: 9 V. Sensing Episodes  ?8 VT-NS  ?10 hours in AT/AF Since Last Session       Heart Failure questions reviewed, pt asymptomatic .   Thoracic impedance normal.  Prescribed dosage: Furosemide 20 mg 1 tablet twice a day  Labs: 11/20/2016 Creatinine 1.24, BUN 14, Potassium 4.0, Sodium 136, EGFR 59->60 08/20/2016 Creatinine 1.36, BUN 18, Potassium 4.0, Sodium 136, EGFR 53->60 06/18/2016 Creatinine 1.25, BUN 12, Potassium 4.0, Sodium 137, EGFR 59->60 03/19/2016 Creatinine 1.24, BUN 13, Potassium 4.0, Sodium 135, EGFR 59->60 02/19/2016 Creatinine 1.18, BUN 15, Potassium 4.1, Sodium 136, EGFR >60  11/14/2017Creatinine 1.32, BUN 12, Potassium 4.1, Sodium 136, EGFR 55->60  06/05/2017Creatinine 1.23, BUN 11, Potassium 3.9, Sodium 137, EGFR >60  Recommendations: No changes.  Encouraged to call for fluid symptoms.  Follow-up plan: ICM clinic phone appointment on 02/13/2017.  Copy of ICM check sent to Dr. Taylor.   3 month ICM trend: 01/06/2017    1 Year ICM trend:        S , RN 01/06/2017 10:31 AM    

## 2017-01-14 MED FILL — DIGOXIN 0.125 MG TABLET: 125 | 30 days supply | Qty: 30 | Fill #4

## 2017-01-14 MED FILL — SPIRONOLACTONE 25 MG TABLET: 25 | 30 days supply | Qty: 30 | Fill #4

## 2017-01-15 MED FILL — ENTRESTO 24 MG-26 MG TABLET: 24-26 | 30 days supply | Qty: 60 | Fill #4

## 2017-01-20 ENCOUNTER — Other Ambulatory Visit (HOSPITAL_COMMUNITY): Payer: Self-pay | Admitting: Cardiology

## 2017-01-20 MED FILL — ELIQUIS 5 MG TABLET: 5 | 30 days supply | Qty: 60 | Fill #0

## 2017-01-23 MED FILL — hydrALAZINE HCL 25 MG TABS: 25 | 30 days supply | Qty: 45 | Fill #1

## 2017-01-29 MED FILL — BISOPROLOL FUMARATE 5 MG TA: 5 | 30 days supply | Qty: 30 | Fill #6

## 2017-02-12 MED FILL — SPIRONOLACTONE 25 MG TABLET: 25 | 30 days supply | Qty: 30 | Fill #5

## 2017-02-13 ENCOUNTER — Ambulatory Visit (INDEPENDENT_AMBULATORY_CARE_PROVIDER_SITE_OTHER): Payer: Medicare Other | Admitting: *Deleted

## 2017-02-13 DIAGNOSIS — I5042 Chronic combined systolic (congestive) and diastolic (congestive) heart failure: Secondary | ICD-10-CM

## 2017-02-13 DIAGNOSIS — I442 Atrioventricular block, complete: Secondary | ICD-10-CM | POA: Diagnosis not present

## 2017-02-13 DIAGNOSIS — Z95810 Presence of automatic (implantable) cardiac defibrillator: Secondary | ICD-10-CM

## 2017-02-13 MED FILL — DIGOXIN 0.125 MG TABLET: 125 | 30 days supply | Qty: 30 | Fill #5

## 2017-02-13 NOTE — Progress Notes (Signed)
EPIC Encounter for ICM Monitoring  Patient Name: Philip Chavez is a 67 y.o. male Date: 02/13/2017 Primary Care Physican: Nicolette Bang, DO Primary [place] Electrophysiologist: Lovena Le Dry Weight:217lbs  RV Pacing: 96.9%       Heart Failure questions reviewed, pt asymptomatic.   Thoracic impedance normal.  Prescribed dosage: Furosemide 20 mg 1 tablet twice a day  Labs: 11/20/2016 Creatinine 1.24, BUN 14, Potassium 4.0, Sodium 136, EGFR 59->60 08/20/2016 Creatinine 1.36, BUN 18, Potassium 4.0, Sodium 136, EGFR 53->60 06/18/2016 Creatinine 1.25, BUN 12, Potassium 4.0, Sodium 137, EGFR 59->60 03/19/2016 Creatinine 1.24, BUN 13, Potassium 4.0, Sodium 135, EGFR 59->60 02/19/2016 Creatinine 1.18, BUN 15, Potassium 4.1, Sodium 136, EGFR >60  11/14/2017Creatinine 1.32, BUN 12, Potassium 4.1, Sodium 136, EGFR 55->60  06/05/2017Creatinine 1.23, BUN 11, Potassium 3.9, Sodium 137, EGFR >60  Recommendations: No changes.   Encouraged to call for fluid symptoms.  Follow-up plan: ICM clinic phone appointment on 03/17/2017.    Copy of ICM check sent to Dr. Lovena Le.   3 month ICM trend: 02/13/2017    1 Year ICM trend:       Rosalene Billings, RN 02/13/2017 3:50 PM

## 2017-02-13 NOTE — Progress Notes (Signed)
Remote ICD transmission.   

## 2017-02-14 MED FILL — ENTRESTO 24 MG-26 MG TABLET: 24-26 | 30 days supply | Qty: 60 | Fill #5

## 2017-02-17 MED FILL — ELIQUIS 5 MG TABLET: 5 | 30 days supply | Qty: 60 | Fill #1

## 2017-02-21 ENCOUNTER — Encounter: Payer: Self-pay | Admitting: Cardiology

## 2017-02-22 IMAGING — DX DG CHEST 2V
2 series · 2 of 2 positions shown · non-contrast
Comparison: CT chest dated 07/14/2013

CLINICAL DATA: Cough, fever x3 days, right lower lobe crackles

EXAM:
CHEST  2 VIEW

[chest pa]
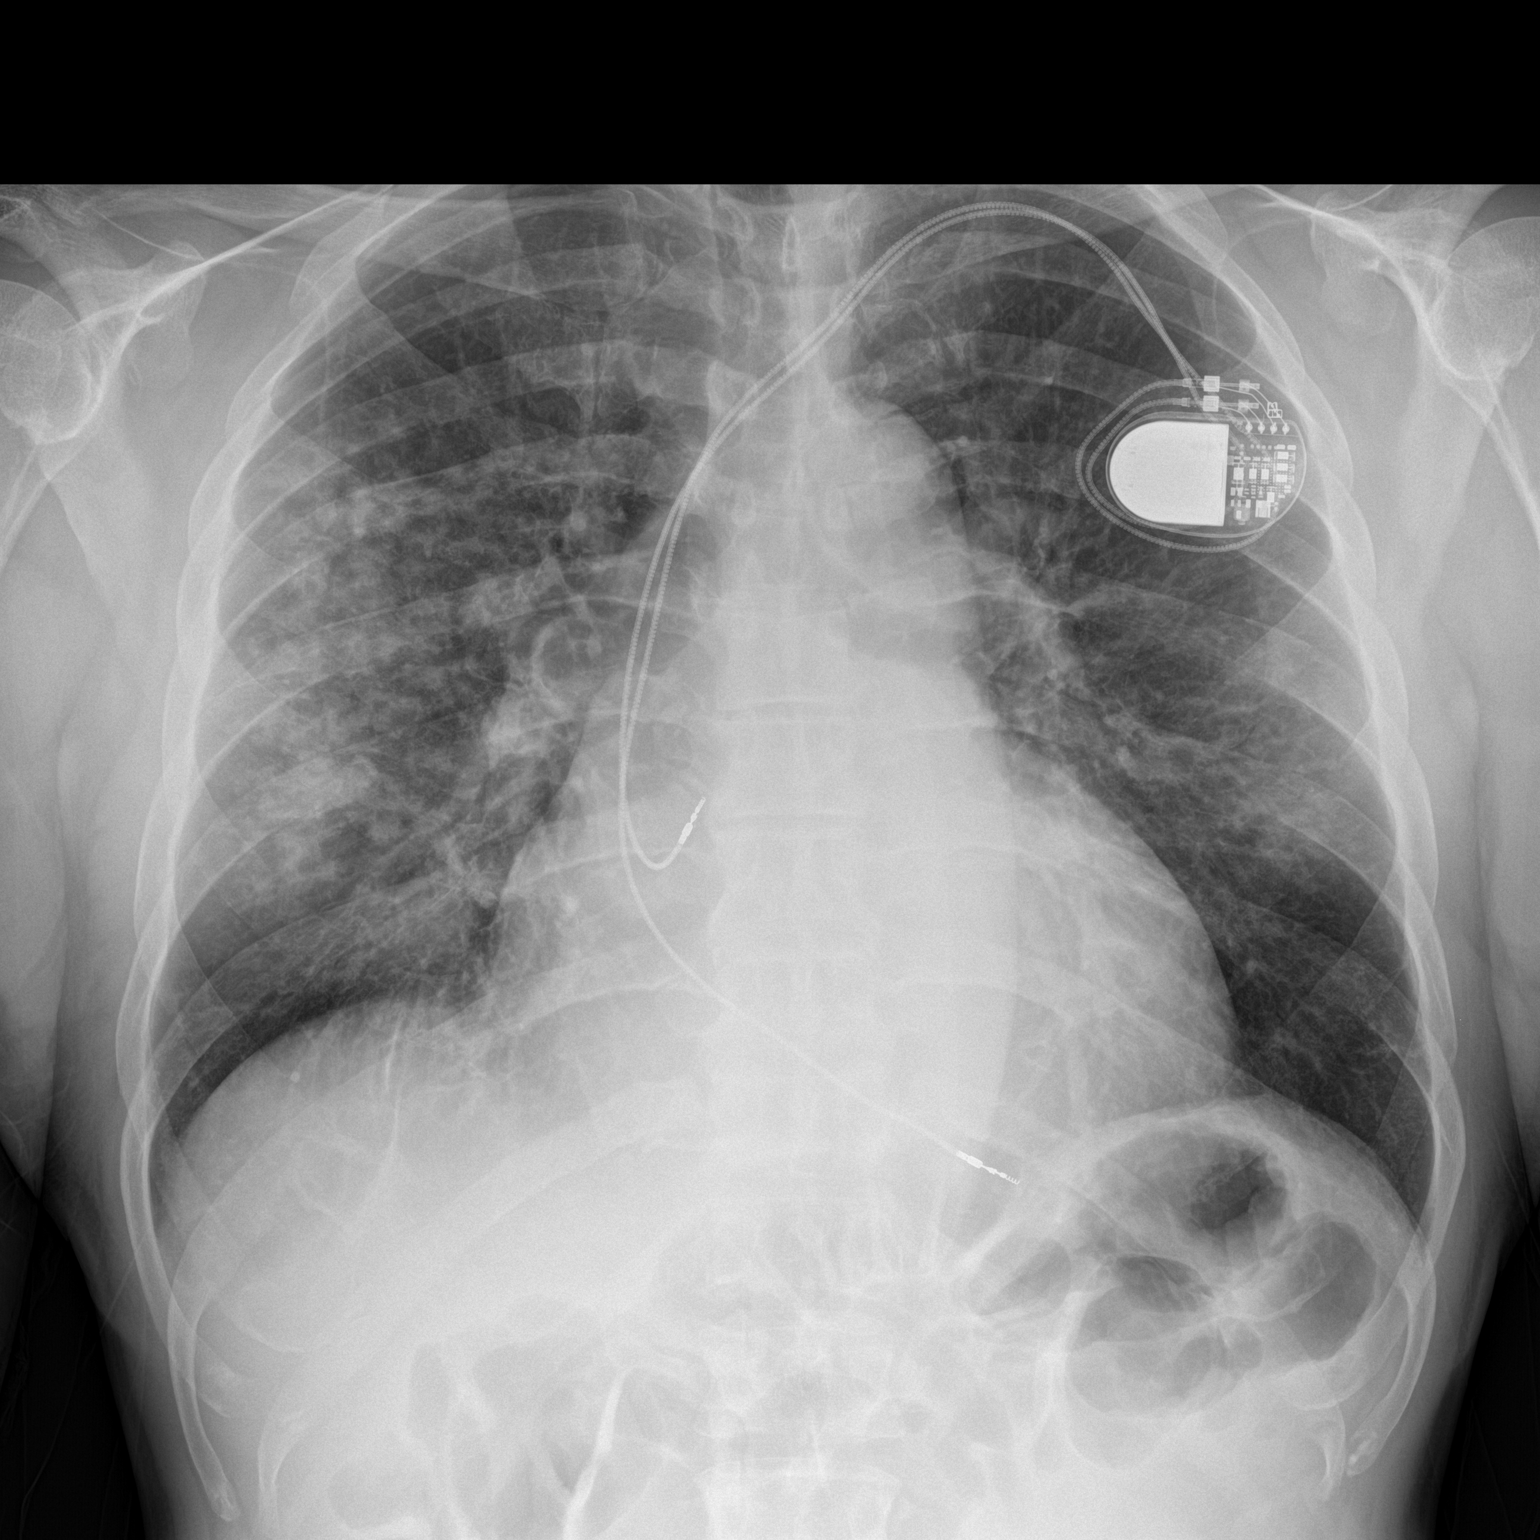

[chest lat]
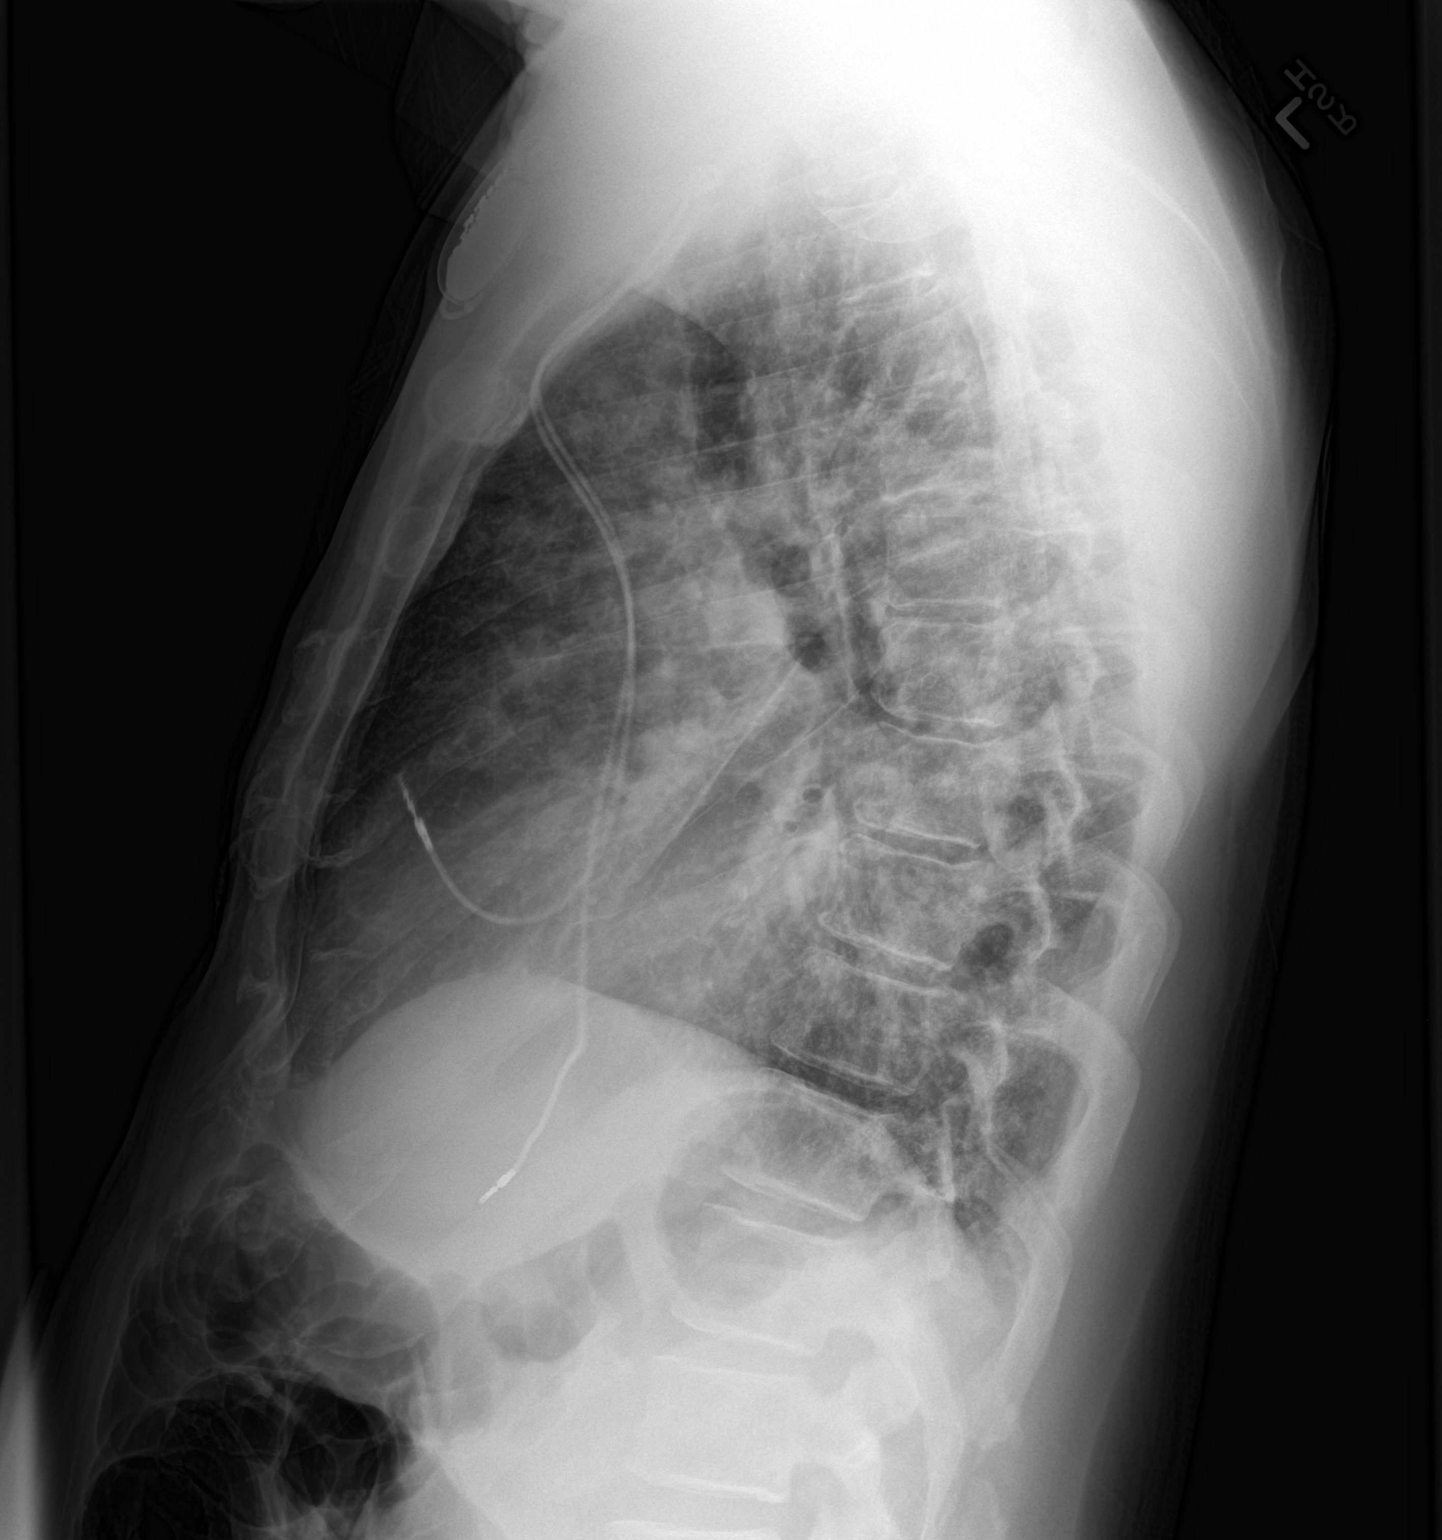

[2 of 2 positions shown; findings below may reference images not displayed]

FINDINGS: Multifocal patchy opacities throughout the right lung and in the
left upper lobe/lingula. No pleural effusion or pneumothorax.

Mild cardiomegaly.  Left subclavian pacemaker.
IMPRESSION: Multifocal pneumonia, right lung predominant.

## 2017-02-24 MED FILL — hydrALAZINE HCL 25 MG TABS: 25 | 30 days supply | Qty: 45 | Fill #2

## 2017-02-25 MED FILL — BISOPROLOL FUMARATE 5 MG TA: 5 | 30 days supply | Qty: 30 | Fill #1

## 2017-03-14 ENCOUNTER — Other Ambulatory Visit (HOSPITAL_COMMUNITY): Payer: Self-pay | Admitting: Cardiology

## 2017-03-17 ENCOUNTER — Telehealth: Payer: Self-pay | Admitting: Cardiology

## 2017-03-17 ENCOUNTER — Ambulatory Visit (INDEPENDENT_AMBULATORY_CARE_PROVIDER_SITE_OTHER): Payer: Medicare Other

## 2017-03-17 ENCOUNTER — Other Ambulatory Visit (HOSPITAL_COMMUNITY): Payer: Self-pay | Admitting: Cardiology

## 2017-03-17 DIAGNOSIS — I5042 Chronic combined systolic (congestive) and diastolic (congestive) heart failure: Secondary | ICD-10-CM

## 2017-03-17 DIAGNOSIS — Z95810 Presence of automatic (implantable) cardiac defibrillator: Secondary | ICD-10-CM

## 2017-03-17 LAB — CUP PACEART REMOTE DEVICE CHECK
Battery Remaining Longevity: 59 mo
Brady Statistic AP VS Percent: 0 %
Brady Statistic AS VS Percent: 2.47 %
Brady Statistic RV Percent Paced: 96.88 %
HighPow Impedance: 63 Ohm
Implantable Lead Implant Date: 20160919
Implantable Lead Location: 753859
Implantable Lead Model: 6935
Implantable Pulse Generator Implant Date: 20160919
Lead Channel Impedance Value: 304 Ohm
Lead Channel Impedance Value: 323 Ohm
Lead Channel Impedance Value: 380 Ohm
Lead Channel Impedance Value: 399 Ohm
Lead Channel Impedance Value: 532 Ohm
Lead Channel Impedance Value: 570 Ohm
Lead Channel Pacing Threshold Amplitude: 1 V
Lead Channel Pacing Threshold Pulse Width: 0.4 ms
Lead Channel Pacing Threshold Pulse Width: 0.4 ms
Lead Channel Sensing Intrinsic Amplitude: 14.375 mV
Lead Channel Sensing Intrinsic Amplitude: 18.875 mV
Lead Channel Sensing Intrinsic Amplitude: 3.75 mV
Lead Channel Setting Pacing Amplitude: 2 V
Lead Channel Setting Pacing Pulse Width: 0.4 ms
MDC IDC LEAD IMPLANT DT: 20150209
MDC IDC LEAD IMPLANT DT: 20160919
MDC IDC LEAD LOCATION: 753858
MDC IDC LEAD LOCATION: 753860
MDC IDC MSMT BATTERY VOLTAGE: 2.96 V
MDC IDC MSMT LEADCHNL LV IMPEDANCE VALUE: 304 Ohm
MDC IDC MSMT LEADCHNL LV IMPEDANCE VALUE: 323 Ohm
MDC IDC MSMT LEADCHNL LV IMPEDANCE VALUE: 494 Ohm
MDC IDC MSMT LEADCHNL LV IMPEDANCE VALUE: 494 Ohm
MDC IDC MSMT LEADCHNL LV IMPEDANCE VALUE: 513 Ohm
MDC IDC MSMT LEADCHNL LV PACING THRESHOLD AMPLITUDE: 2.5 V
MDC IDC MSMT LEADCHNL RA IMPEDANCE VALUE: 437 Ohm
MDC IDC MSMT LEADCHNL RA SENSING INTR AMPL: 3.75 mV
MDC IDC MSMT LEADCHNL RV IMPEDANCE VALUE: 323 Ohm
MDC IDC MSMT LEADCHNL RV PACING THRESHOLD AMPLITUDE: 1 V
MDC IDC MSMT LEADCHNL RV PACING THRESHOLD PULSEWIDTH: 0.4 ms
MDC IDC SESS DTM: 20190110083326
MDC IDC SET LEADCHNL RV PACING AMPLITUDE: 2 V
MDC IDC SET LEADCHNL RV SENSING SENSITIVITY: 0.3 mV
MDC IDC STAT BRADY AP VP PERCENT: 1.33 %
MDC IDC STAT BRADY AS VP PERCENT: 96.2 %
MDC IDC STAT BRADY RA PERCENT PACED: 1.32 %

## 2017-03-17 MED FILL — SPIRONOLACTONE 25 MG TABLET: 25 | 30 days supply | Qty: 30 | Fill #0

## 2017-03-17 MED FILL — ELIQUIS 5 MG TABLET: 5 | 30 days supply | Qty: 60 | Fill #2

## 2017-03-17 NOTE — Telephone Encounter (Signed)
Spoke with pt and reminded pt of remote transmission that is due today. Pt verbalized understanding.   

## 2017-03-18 MED FILL — DIGOXIN 0.125 MG TABLET: 125 | 30 days supply | Qty: 30 | Fill #0

## 2017-03-18 MED FILL — ENTRESTO 24 MG-26 MG TABLET: 24-26 | 30 days supply | Qty: 60 | Fill #0

## 2017-03-18 NOTE — Progress Notes (Signed)
EPIC Encounter for ICM Monitoring  Patient Name: Philip Chavez is a 67 y.o. male Date: 03/18/2017 Primary Care Physican: Nicolette Bang, DO Primary [place] Electrophysiologist: Lovena Le Dry Weight:217lbs  RV Pacing: 88.9%          Heart Failure questions reviewed, pt asymptomatic.   Thoracic impedance abnormal suggesting fluid accumulation since 03/12/2017.  Prescribed dosage: Furosemide 20 mg 1 tablet twice a day  Labs: 11/20/2016 Creatinine 1.24, BUN 14, Potassium 4.0, Sodium 136, EGFR 59->60 08/20/2016 Creatinine 1.36, BUN 18, Potassium 4.0, Sodium 136, EGFR 53->60 06/18/2016 Creatinine 1.25, BUN 12, Potassium 4.0, Sodium 137, EGFR 59->60 03/19/2016 Creatinine 1.24, BUN 13, Potassium 4.0, Sodium 135, EGFR 59->60 02/19/2016 Creatinine 1.18, BUN 15, Potassium 4.1, Sodium 136, EGFR >60  11/14/2017Creatinine 1.32, BUN 12, Potassium 4.1, Sodium 136, EGFR 55->60  06/05/2017Creatinine 1.23, BUN 11, Potassium 3.9, Sodium 137, EGFR >60  Recommendations: No changes.  Advised to limit salt intake such as potato chips he said he has been eating in the last 2 days. Encouraged to call for fluid symptoms.   Follow-up plan: ICM clinic phone appointment on 03/27/2017 to recheck fluid levels.    Copy of ICM check sent to Dr. Lovena Le and Dr. Aundra Dubin for review and recommendations if needed.   3 month ICM trend: 03/17/2017    1 Year ICM trend:       Rosalene Billings, RN 03/18/2017 7:56 AM

## 2017-03-26 MED FILL — BISOPROLOL FUMARATE 5 MG TA: 5 | 30 days supply | Qty: 30 | Fill #2

## 2017-03-26 MED FILL — hydrALAZINE HCL 25 MG TABS: 25 | 30 days supply | Qty: 45 | Fill #3

## 2017-03-27 ENCOUNTER — Ambulatory Visit (INDEPENDENT_AMBULATORY_CARE_PROVIDER_SITE_OTHER): Payer: Medicare Other

## 2017-03-27 DIAGNOSIS — Z95810 Presence of automatic (implantable) cardiac defibrillator: Secondary | ICD-10-CM | POA: Diagnosis not present

## 2017-03-27 DIAGNOSIS — I5042 Chronic combined systolic (congestive) and diastolic (congestive) heart failure: Secondary | ICD-10-CM | POA: Diagnosis not present

## 2017-03-28 NOTE — Progress Notes (Signed)
EPIC Encounter for ICM Monitoring  Patient Name: Philip Chavez is a 67 y.o. male Date: 03/28/2017 Primary Care Physican: Nicolette Bang, DO Primary [place] Electrophysiologist: Lovena Le Dry Weight:217lbs  RV Pacing:  94.6%       Heart Failure questions reviewed, pt asymptomatic.   Thoracic impedance normal.  Prescribed dosage: Furosemide 20 mg 1 tablet twice a day  Labs: 11/20/2016 Creatinine 1.24, BUN 14, Potassium 4.0, Sodium 136, EGFR 59->60 08/20/2016 Creatinine 1.36, BUN 18, Potassium 4.0, Sodium 136, EGFR 53->60 06/18/2016 Creatinine 1.25, BUN 12, Potassium 4.0, Sodium 137, EGFR 59->60 03/19/2016 Creatinine 1.24, BUN 13, Potassium 4.0, Sodium 135, EGFR 59->60 02/19/2016 Creatinine 1.18, BUN 15, Potassium 4.1, Sodium 136, EGFR >60  11/14/2017Creatinine 1.32, BUN 12, Potassium 4.1, Sodium 136, EGFR 55->60  06/05/2017Creatinine 1.23, BUN 11, Potassium 3.9, Sodium 137, EGFR >60  Recommendations: No changes.   Encouraged to call for fluid symptoms.  Follow-up plan: ICM clinic phone appointment on 04/29/2017.    Copy of ICM check sent to Dr. Lovena Le.   3 month ICM trend: 03/27/2017    1 Year ICM trend:       Rosalene Billings, RN 03/28/2017 9:31 AM

## 2017-04-14 MED FILL — SPIRONOLACTONE 25 MG TABLET: 25 | 30 days supply | Qty: 30 | Fill #1

## 2017-04-17 MED FILL — ENTRESTO 24 MG-26 MG TABLET: 24-26 | 30 days supply | Qty: 60 | Fill #1

## 2017-04-17 MED FILL — DIGOXIN 0.125 MG TABLET: 125 | 30 days supply | Qty: 30 | Fill #1

## 2017-04-18 MED FILL — ELIQUIS 5 MG TABLET: 5 | 30 days supply | Qty: 60 | Fill #3

## 2017-04-25 ENCOUNTER — Other Ambulatory Visit (HOSPITAL_COMMUNITY): Payer: Self-pay | Admitting: Internal Medicine

## 2017-04-28 MED FILL — BISOPROLOL FUMARATE 5 MG TA: 5 | 30 days supply | Qty: 30 | Fill #3

## 2017-04-28 MED FILL — hydrALAZINE HCL 25 MG TABS: 25 | 30 days supply | Qty: 45 | Fill #0

## 2017-04-29 ENCOUNTER — Ambulatory Visit (INDEPENDENT_AMBULATORY_CARE_PROVIDER_SITE_OTHER): Payer: Medicare Other

## 2017-04-29 ENCOUNTER — Telehealth: Payer: Self-pay | Admitting: Cardiology

## 2017-04-29 DIAGNOSIS — I5042 Chronic combined systolic (congestive) and diastolic (congestive) heart failure: Secondary | ICD-10-CM

## 2017-04-29 DIAGNOSIS — Z95810 Presence of automatic (implantable) cardiac defibrillator: Secondary | ICD-10-CM

## 2017-04-29 NOTE — Telephone Encounter (Signed)
Spoke with pt and reminded pt of remote transmission that is due today. Pt verbalized understanding.   

## 2017-04-29 NOTE — Progress Notes (Signed)
EPIC Encounter for ICM Monitoring  Patient Name: Philip Chavez is a 67 y.o. male Date: 04/29/2017 Primary Care Physican: Nicolette Bang, DO Primary [place] Electrophysiologist: Lovena Le Dry Weight: 217lbs  RV Pacing:  96.2%       Heart Failure questions reviewed, pt asymptomatic.   Thoracic impedance slightly below baseline normal.  Prescribed dosage: Furosemide 20 mg 1 tablet twice a day  Labs: 11/20/2016 Creatinine 1.24, BUN 14, Potassium 4.0, Sodium 136, EGFR 59->60 08/20/2016 Creatinine 1.36, BUN 18, Potassium 4.0, Sodium 136, EGFR 53->60 06/18/2016 Creatinine 1.25, BUN 12, Potassium 4.0, Sodium 137, EGFR 59->60 03/19/2016 Creatinine 1.24, BUN 13, Potassium 4.0, Sodium 135, EGFR 59->60 02/19/2016 Creatinine 1.18, BUN 15, Potassium 4.1, Sodium 136, EGFR >60  11/14/2017Creatinine 1.32, BUN 12, Potassium 4.1, Sodium 136, EGFR 55->60  06/05/2017Creatinine 1.23, BUN 11, Potassium 3.9, Sodium 137, EGFR >60  Recommendations: No changes.  Advised to limit salt intake.  Encouraged to call for fluid symptoms.  Follow-up plan: ICM clinic phone appointment on 06/02/2017.   Copy of ICM check sent to Dr. Lovena Le.   3 month ICM trend: 04/29/2017    1 Year ICM trend:       Rosalene Billings, RN 04/29/2017 11:54 AM

## 2017-05-15 ENCOUNTER — Other Ambulatory Visit (HOSPITAL_COMMUNITY): Payer: Self-pay | Admitting: Internal Medicine

## 2017-05-15 MED FILL — SPIRONOLACTONE 25 MG TABLET: 25 | 30 days supply | Qty: 30 | Fill #2

## 2017-05-19 ENCOUNTER — Other Ambulatory Visit (HOSPITAL_COMMUNITY): Payer: Self-pay | Admitting: Internal Medicine

## 2017-05-19 MED FILL — DIGOXIN 0.125 MG TABLET: 125 | 30 days supply | Qty: 30 | Fill #0

## 2017-05-20 MED FILL — ELIQUIS 5 MG TABLET: 5 | 30 days supply | Qty: 60 | Fill #4

## 2017-05-23 ENCOUNTER — Ambulatory Visit (HOSPITAL_COMMUNITY)
Admission: RE | Admit: 2017-05-23 | Discharge: 2017-05-23 | Disposition: A | Discharge status: Home / Self Care | Payer: Medicare Other | Source: Ambulatory Visit | Attending: Cardiology | Admitting: Cardiology

## 2017-05-23 ENCOUNTER — Other Ambulatory Visit (HOSPITAL_COMMUNITY): Payer: Self-pay | Admitting: Internal Medicine

## 2017-05-23 VITALS — BP 98/60 | HR 75 | Wt 214.2 lb

## 2017-05-23 DIAGNOSIS — Z79899 Other long term (current) drug therapy: Secondary | ICD-10-CM | POA: Insufficient documentation

## 2017-05-23 DIAGNOSIS — I428 Other cardiomyopathies: Secondary | ICD-10-CM | POA: Insufficient documentation

## 2017-05-23 DIAGNOSIS — Z86711 Personal history of pulmonary embolism: Secondary | ICD-10-CM | POA: Diagnosis not present

## 2017-05-23 DIAGNOSIS — I5022 Chronic systolic (congestive) heart failure: Secondary | ICD-10-CM | POA: Insufficient documentation

## 2017-05-23 DIAGNOSIS — I480 Paroxysmal atrial fibrillation: Secondary | ICD-10-CM | POA: Diagnosis not present

## 2017-05-23 DIAGNOSIS — Z86718 Personal history of other venous thrombosis and embolism: Secondary | ICD-10-CM | POA: Diagnosis not present

## 2017-05-23 DIAGNOSIS — I2510 Atherosclerotic heart disease of native coronary artery without angina pectoris: Secondary | ICD-10-CM | POA: Diagnosis not present

## 2017-05-23 DIAGNOSIS — Z8249 Family history of ischemic heart disease and other diseases of the circulatory system: Secondary | ICD-10-CM | POA: Diagnosis not present

## 2017-05-23 DIAGNOSIS — I5042 Chronic combined systolic (congestive) and diastolic (congestive) heart failure: Secondary | ICD-10-CM | POA: Diagnosis not present

## 2017-05-23 DIAGNOSIS — N189 Chronic kidney disease, unspecified: Secondary | ICD-10-CM | POA: Diagnosis not present

## 2017-05-23 DIAGNOSIS — E785 Hyperlipidemia, unspecified: Secondary | ICD-10-CM

## 2017-05-23 DIAGNOSIS — G4733 Obstructive sleep apnea (adult) (pediatric): Secondary | ICD-10-CM | POA: Diagnosis not present

## 2017-05-23 DIAGNOSIS — I442 Atrioventricular block, complete: Secondary | ICD-10-CM | POA: Diagnosis not present

## 2017-05-23 DIAGNOSIS — Z7901 Long term (current) use of anticoagulants: Secondary | ICD-10-CM | POA: Insufficient documentation

## 2017-05-23 DIAGNOSIS — I4891 Unspecified atrial fibrillation: Secondary | ICD-10-CM | POA: Diagnosis not present

## 2017-05-23 LAB — CBC
HCT: 43.1 % (ref 39.0–52.0)
Hemoglobin: 14.4 g/dL (ref 13.0–17.0)
MCH: 31.9 pg (ref 26.0–34.0)
MCHC: 33.4 g/dL (ref 30.0–36.0)
MCV: 95.6 fL (ref 78.0–100.0)
PLATELETS: 151 10*3/uL (ref 150–400)
RBC: 4.51 MIL/uL (ref 4.22–5.81)
RDW: 13.8 % (ref 11.5–15.5)
WBC: 4.9 10*3/uL (ref 4.0–10.5)

## 2017-05-23 LAB — BASIC METABOLIC PANEL
Anion gap: 11 (ref 5–15)
BUN: 12 mg/dL (ref 6–20)
CHLORIDE: 103 mmol/L (ref 101–111)
CO2: 23 mmol/L (ref 22–32)
CREATININE: 1.22 mg/dL (ref 0.61–1.24)
Calcium: 9.1 mg/dL (ref 8.9–10.3)
GFR calc non Af Amer: >60 mL/min (ref >60)
Glucose, Bld: 98 mg/dL (ref 65–99)
Potassium: 3.8 mmol/L (ref 3.5–5.1)
Sodium: 137 mmol/L (ref 135–145)

## 2017-05-23 LAB — LIPID PANEL
CHOL/HDL RATIO: 3.4 ratio
Cholesterol: 134 mg/dL (ref 0–200)
HDL: 40 mg/dL — ABNORMAL LOW (ref >40)
LDL Cholesterol: 69 mg/dL (ref 0–99)
Triglycerides: 125 mg/dL (ref <150)
VLDL: 25 mg/dL (ref 0–40)

## 2017-05-23 LAB — DIGOXIN LEVEL: Digoxin Level: 0.5 ng/mL — ABNORMAL LOW (ref 0.8–2.0)

## 2017-05-23 MED ORDER — SACUBITRIL-VALSARTAN 24-26 MG PO TABS: 1.0000 | ORAL_TABLET | Freq: Two times a day (BID) | ORAL | 1 refills | Status: DC

## 2017-05-23 MED ORDER — SACUBITRIL-VALSARTAN 24-26 MG PO TABS: 1.0000 | ORAL_TABLET | Freq: Two times a day (BID) | ORAL | 6 refills | Status: DC

## 2017-05-23 NOTE — Patient Instructions (Signed)
Labs drawn today (if we do not call you, then your lab work was stable)   Your physician recommends that you schedule a follow-up appointment in: 4 months with Dr. McLean    

## 2017-05-26 ENCOUNTER — Other Ambulatory Visit (HOSPITAL_COMMUNITY): Payer: Self-pay | Admitting: *Deleted

## 2017-05-26 ENCOUNTER — Encounter (HOSPITAL_COMMUNITY): Payer: Self-pay

## 2017-05-26 MED ORDER — SACUBITRIL-VALSARTAN 24-26 MG PO TABS: 1.0000 | ORAL_TABLET | Freq: Two times a day (BID) | ORAL | 6 refills | Status: DC

## 2017-05-26 NOTE — Progress Notes (Signed)
Patient ID: Philip Chavez, male   DOB: 12/10/1950, 67 y.o.   MRN: [id number] PCP: Dr. Gerlean Ren EP: Dr. Lovena Le HF cardiologist: Dr Aundra Dubin  67 y.o. with history of complete heart block/Medtronic PPM, cardiomyopathy, and atrial flutter s/p ablation presents for CHF clinic evaluation.  Patient developed complete heart block in 2/15 and had PPM placed at that time.  He paces his RV continuously.  In 2/15, EF was 50-55% by echo.  By 6/15, EF had fallen to 20-25%.  Cardiolite showed possible scar but no ischemia.  On 09/03/14, he was admitted with dyspnea and found to be in atrial flutter with RLL PNA and with volume overload.  Echo showed EF 15% with diffuse hypokinesis.  He was started on IV Lasix and IV amiodarone. He converted back to NSR.  He subsequently had atrial flutter ablation.  He had right and left heart cath in 9/16, showing nonobstructive CAD and optimized filling pressures with CI 2.09.  He subsequently had upgrade to CRT by Dr Lovena Le.  EF up to 30-35% in 4/17.  Subsequently, the LV lead dislodged.  He was offered lead revision, but preferred watchful waiting. Echo in 5/18 showed EF down to 20%.    Philip Chavez returns for followup of CHF today.  He is doing well with minimal symptoms.  No exertional dyspnea.  No chest pain.  No lightheadedness.  No orthopnea/PND. Weight is stable.   Medtronic device interrogated: Fluid index/impedance stable. afib for 2 days in 2/19. He is no longer BiV pacing, RV pacing > 99% of the time.    Labs (8/16): K 3.9, creatinine 1.03, AST 106, ALT 173, HCT 45.1, LDL 132 Labs (9/16): K 3.9, creatinine 1.16, HCT 42.5 Labs (10/16): K 3.9, creatinine 1.32, HCT 39.9 Labs (2/17): K 4.1, creatinine 1.26, BNP 107, digoxin 0.3 Labs (07/10/2015): K 3.9 Creatinine 1.23  Labs (1/18): K 4.1, creatinine 1.18, digoxin 0.3 Labs (2/18): K 4, creatinine 1.24, digoxin 0.4 Labs (5/18): K 4, creatinine 1.25, hgb 14.8, digoxin 0.5 Labs (10/18): K 4, creatinine 1.24, digoxin 0.2  PMH: 1.  H/o complete heart block: Has Medtronic PPM, placed in 2/15.   2. Cardiomyopathy: Echo (2/15) prior to PPM placement with EF 50-55%.  Echo (6/15) with EF 20-25%,  Cardiolite at that time showed possible scar but no ischemia.  Echo (8/16) with EF 15%, diffuse hypokinesis, mildly dilated RV with normal systolic function, PA systolic pressure 69 mmHg.  LHC/RHC (9/16) with mean RA 4, PA 31/15 mean 20, mean PCWP 8, CI 2.09, 40% mLAD stenosis.  Upgrade to MDT CRT-D in 9/16.  - Echo (4/17) with EF 30-35%.   - LV lead dislodged and loss of BiV pacing.  - Echo (5/18) with EF 20%, mild LV dilation, normal RV size with mildly decreased systolic function, mild AI.  3. CKD 4. 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  5. Atrial tachycardia: paroxysmal. 6. Atrial flutter: s/p ablation in 8/16.  7. OSA 8. Hyperlipidemia 9. Shingles with post-herpetic neuralgia.  10. Atrial fibrillation: Paroxysmal.   FH: Father with CHF, complete heart block with PPM.  Sister with complete heart block and PPM.   SH: Retired Curator, nonsmoker, lives in [place]: All systems reviewed and negative except as per HPI  Current Outpatient Medications  Medication Sig Dispense Refill  . acetaminophen (TYLENOL) 325 MG tablet Take 1-2 tablets (325-650 mg total) by mouth every 4 (four) hours as needed for mild pain.    . bisoprolol (ZEBETA) 5 MG tablet Take  1 tablet (5 mg total) by mouth daily. 30 tablet 6  . digoxin (LANOXIN) 0.125 MG tablet TAKE 1 TABLET BY MOUTH DAILY. NEEDS OFFICE VISIT 30 tablet 0  . ELIQUIS 5 MG TABS tablet TAKE 1 TABLET BY MOUTH 2 TIMES DAILY. 60 tablet 6  . furosemide (LASIX) 20 MG tablet Take 1 tablet (20 mg total) 2 (two) times daily by mouth. 60 tablet 5  . hydrALAZINE (APRESOLINE) 25 MG tablet TAKE 1/2 TABLET BY MOUTH 3 TIMES DAILY. 45 tablet 3  . isosorbide mononitrate (IMDUR) 30 MG 24 hr tablet Take 1 tablet (30 mg total) by mouth daily. 30 tablet 3  . sacubitril-valsartan (ENTRESTO) 24-26 MG  Take 1 tablet by mouth 2 (two) times daily. 60 tablet 6  . simvastatin (ZOCOR) 40 MG tablet Take 1 tablet (40 mg total) by mouth daily. 30 tablet 1  . spironolactone (ALDACTONE) 25 MG tablet Take 1 tablet (25 mg total) by mouth daily. Due for follow up 03/2017. [phone] 30 tablet 2  . traMADol (ULTRAM) 50 MG tablet Take 1 tablet (50 mg total) by mouth every 6 (six) hours as needed. 30 tablet 0   No current facility-administered medications for this encounter.    BP 98/60   Pulse 75   Wt 214 lb 4 oz (97.2 kg)   SpO2 92%   BMI 28.27 kg/m  General: NAD Neck: No JVD, no thyromegaly or thyroid nodule.  Lungs: Clear to auscultation bilaterally with normal respiratory effort. CV: Nondisplaced PMI.  Heart regular S1/S2, no S3/S4, no murmur.  No peripheral edema.  No carotid bruit.  Normal pedal pulses.  Abdomen: Soft, nontender, no hepatosplenomegaly, no distention.  Skin: Intact without lesions or rashes.  Neurologic: Alert and oriented x 3.  Psych: Normal affect. Extremities: No clubbing or cyanosis.  HEENT: Normal.   Assessment/Plan: 1. Chronic systolic CHF: Nonischemic cardiomyopathy.  EF 15% on echo in 8/16.  Patient had borderline reduced EF when PPM was placed in 2/15.  After that, EF fell significantly.  He has a history of complete heart block and father had CHF and CHB with pacemaker, and sounds like sister also had CHB with pacemaker. 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  He had CRT upgrade.  No significant CAD on last cath.  Echo in 4/17 showed improvement in EF to 30-35%.  However, his LV lead has dislodged and he is no longer BiV pacing, now RV pacing > 99% of the time.  Echo in 5/18 showed EF down to 20%.  Symptomatically, he is still doing very well (NYHA class I-II) and no volume overload on exam or by Optivol. BP soft, no room to titrate up meds today.  - Continue bisoprolol 5 mg daily.  - Continue  Entresto 24/26 bid,  spironolactone 25 daily, Lasix 20 bid.  BMET today.   - Continue digoxin. Get digoxin level today.  - He has not been taking Bidil due to cost.  He is taking Imdur 30 mg daily + hydralazine 12.5 mg tid.  - Echo in 5/18 showed EF reduced to 20%, possibly due to loss of BiV pacing.  He has minimal symptoms and has wanted to hold off on replacement of LV lead.  - We have discussed genetic evaluation to look for LMNA or SCN5A mutations that could be used for familial screening, he has wanted to hold off.  2. Atrial flutter: s/p ablation.  3. Atrial fibrillation: Currently in NSR but paroxysmal atrial fibrillation  continues to be noted on device interrogation.  - Continue Eliquis, check CBC today. 3. PE/DVT: >1 year ago and associated with surgery.   4. Hyperlipidemia: He has mild CAD.   - No ASA with Eliquis.   - Continue simvastatin 40 mg daily, check lipids today.  5. Complete heart block: Now RV pacing with loss of LV lead.   Followup in 4 months.   Loralie Champagne 05/26/2017

## 2017-05-28 MED FILL — BISOPROLOL FUMARATE 5 MG TA: 5 | 30 days supply | Qty: 30 | Fill #4

## 2017-05-30 MED FILL — hydrALAZINE HCL 25 MG TABS: 25 | 30 days supply | Qty: 45 | Fill #1

## 2017-06-02 ENCOUNTER — Telehealth: Payer: Self-pay | Admitting: Cardiology

## 2017-06-02 ENCOUNTER — Ambulatory Visit (INDEPENDENT_AMBULATORY_CARE_PROVIDER_SITE_OTHER): Payer: Medicare Other | Admitting: *Deleted

## 2017-06-02 DIAGNOSIS — I5042 Chronic combined systolic (congestive) and diastolic (congestive) heart failure: Secondary | ICD-10-CM

## 2017-06-02 DIAGNOSIS — I442 Atrioventricular block, complete: Secondary | ICD-10-CM

## 2017-06-02 DIAGNOSIS — Z95810 Presence of automatic (implantable) cardiac defibrillator: Secondary | ICD-10-CM

## 2017-06-02 NOTE — Telephone Encounter (Signed)
Spoke with pt and reminded pt of remote transmission that is due today. Pt verbalized understanding.   

## 2017-06-03 ENCOUNTER — Encounter: Payer: Self-pay | Admitting: Cardiology

## 2017-06-03 NOTE — Progress Notes (Signed)
EPIC Encounter for ICM Monitoring  Patient Name: Philip Chavez is a 67 y.o. male Date: 06/03/2017 Primary Care Physican: Nicolette Bang, DO Primary [place] Electrophysiologist: Lovena Le Dry Weight: 217lbs  RV Pacing:98.7%      Heart Failure questions reviewed, pt asymptomatic.   Thoracic impedance normal.  Prescribed dosage: Furosemide 20 mg 1 tablet twice a day  Labs: 11/20/2016 Creatinine 1.24, BUN 14, Potassium 4.0, Sodium 136, EGFR 59->60 08/20/2016 Creatinine 1.36, BUN 18, Potassium 4.0, Sodium 136, EGFR 53->60 06/18/2016 Creatinine 1.25, BUN 12, Potassium 4.0, Sodium 137, EGFR 59->60 03/19/2016 Creatinine 1.24, BUN 13, Potassium 4.0, Sodium 135, EGFR 59->60 02/19/2016 Creatinine 1.18, BUN 15, Potassium 4.1, Sodium 136, EGFR >60  11/14/2017Creatinine 1.32, BUN 12, Potassium 4.1, Sodium 136, EGFR 55->60  06/05/2017Creatinine 1.23, BUN 11, Potassium 3.9, Sodium 137, EGFR >60  Recommendations: No changes.  Reinforced fluid restriction to < 2 L daily and sodium restriction to less than 2000 mg daily.  Encouraged to call for fluid symptoms.  Follow-up plan: ICM clinic phone appointment on 07/03/2017.    Copy of ICM check sent to Dr. Lovena Le.   3 month ICM trend: 06/02/2017    1 Year ICM trend:       Rosalene Billings, RN 06/03/2017 2:54 PM

## 2017-06-03 NOTE — Progress Notes (Signed)
Remote ICD transmission.   

## 2017-06-04 ENCOUNTER — Other Ambulatory Visit: Payer: Self-pay | Admitting: Internal Medicine

## 2017-06-04 LAB — CUP PACEART REMOTE DEVICE CHECK
Brady Statistic AP VP Percent: 3.93 %
Brady Statistic AP VS Percent: 0 %
Brady Statistic AS VP Percent: 93.84 %
Brady Statistic AS VS Percent: 2.23 %
Brady Statistic RV Percent Paced: 97.1 %
HighPow Impedance: 60 Ohm
Implantable Lead Implant Date: 20150209
Implantable Lead Implant Date: 20160919
Implantable Lead Location: 753858
Implantable Lead Location: 753859
Implantable Lead Location: 753860
Implantable Lead Model: 5076
Implantable Lead Model: 6935
Lead Channel Impedance Value: 304 Ohm
Lead Channel Impedance Value: 323 Ohm
Lead Channel Impedance Value: 399 Ohm
Lead Channel Impedance Value: 513 Ohm
Lead Channel Impedance Value: 513 Ohm
Lead Channel Pacing Threshold Amplitude: 0.75 V
Lead Channel Pacing Threshold Amplitude: 1 V
Lead Channel Pacing Threshold Amplitude: 2.5 V
Lead Channel Pacing Threshold Pulse Width: 0.4 ms
Lead Channel Sensing Intrinsic Amplitude: 3.25 mV
Lead Channel Sensing Intrinsic Amplitude: 3.25 mV
Lead Channel Setting Pacing Amplitude: 2 V
Lead Channel Setting Sensing Sensitivity: 0.3 mV
MDC IDC LEAD IMPLANT DT: 20160919
MDC IDC MSMT BATTERY REMAINING LONGEVITY: 55 mo
MDC IDC MSMT BATTERY VOLTAGE: 2.97 V
MDC IDC MSMT LEADCHNL LV IMPEDANCE VALUE: 304 Ohm
MDC IDC MSMT LEADCHNL LV IMPEDANCE VALUE: 323 Ohm
MDC IDC MSMT LEADCHNL LV IMPEDANCE VALUE: 399 Ohm
MDC IDC MSMT LEADCHNL LV IMPEDANCE VALUE: 513 Ohm
MDC IDC MSMT LEADCHNL LV IMPEDANCE VALUE: 513 Ohm
MDC IDC MSMT LEADCHNL LV IMPEDANCE VALUE: 513 Ohm
MDC IDC MSMT LEADCHNL RA IMPEDANCE VALUE: 456 Ohm
MDC IDC MSMT LEADCHNL RA PACING THRESHOLD PULSEWIDTH: 0.4 ms
MDC IDC MSMT LEADCHNL RV IMPEDANCE VALUE: 323 Ohm
MDC IDC MSMT LEADCHNL RV PACING THRESHOLD PULSEWIDTH: 0.4 ms
MDC IDC MSMT LEADCHNL RV SENSING INTR AMPL: 19 mV
MDC IDC MSMT LEADCHNL RV SENSING INTR AMPL: 19 mV
MDC IDC PG IMPLANT DT: 20160919
MDC IDC SESS DTM: 20190429203204
MDC IDC SET LEADCHNL RA PACING AMPLITUDE: 1.5 V
MDC IDC SET LEADCHNL RV PACING PULSEWIDTH: 0.4 ms
MDC IDC STAT BRADY RA PERCENT PACED: 3.8 %

## 2017-06-12 ENCOUNTER — Other Ambulatory Visit (HOSPITAL_COMMUNITY): Payer: Self-pay | Admitting: Internal Medicine

## 2017-06-16 ENCOUNTER — Other Ambulatory Visit: Payer: Self-pay | Admitting: Cardiology

## 2017-06-16 ENCOUNTER — Other Ambulatory Visit (HOSPITAL_COMMUNITY): Payer: Self-pay | Admitting: Internal Medicine

## 2017-06-16 MED FILL — SPIRONOLACTONE 25 MG TABLET: 25 | 30 days supply | Qty: 30 | Fill #0

## 2017-06-17 MED FILL — DIGOXIN 0.125 MG TABLET: 125 | 30 days supply | Qty: 30 | Fill #0

## 2017-06-19 MED FILL — ELIQUIS 5 MG TABLET: 5 | 30 days supply | Qty: 60 | Fill #5

## 2017-06-26 ENCOUNTER — Telehealth (HOSPITAL_COMMUNITY): Payer: Self-pay | Admitting: *Deleted

## 2017-06-26 ENCOUNTER — Telehealth: Payer: Self-pay

## 2017-06-26 NOTE — Telephone Encounter (Signed)
Received call from patient stating he is having some pain in his side without any other symptoms. He is asking for an appointment with cardiologist.  Advised to call Dr Claris Gladden office for an appointment or go to urgent care or ER if needed.  Will forward call to HF clinic.

## 2017-06-26 NOTE — Telephone Encounter (Signed)
Unlikely PE if he has been compliant with Eliquis but can get him in with PA/NP next week.

## 2017-06-26 NOTE — Telephone Encounter (Signed)
See phone note from our office

## 2017-06-26 NOTE — Telephone Encounter (Signed)
Left message to call back  

## 2017-06-26 NOTE — Telephone Encounter (Signed)
Pt states he has been having pain in his R side for 3 days, he states this is how he felt when he had a PE years ago and is concerned.  He denies any CP or tightness, denies SOB and cough.  He states the pain is off/on and feels like a soreness, he states it resolves on its own.  He states he has been taking his Eliquis BID and has not missed any does.  He would like an appt for further eval, advised will send info to Dr Aundra Dubin for his recommendations.

## 2017-06-27 ENCOUNTER — Other Ambulatory Visit (HOSPITAL_COMMUNITY): Payer: Self-pay | Admitting: Cardiology

## 2017-06-27 MED FILL — BISOPROLOL FUMARATE 5 MG TA: 5 | 30 days supply | Qty: 30 | Fill #0

## 2017-07-01 MED FILL — hydrALAZINE HCL 25 MG TABS: 25 | 30 days supply | Qty: 45 | Fill #2

## 2017-07-01 NOTE — Telephone Encounter (Signed)
Left message to call back  

## 2017-07-01 NOTE — Telephone Encounter (Signed)
Left mess for pt stating if this was still bothering him and he wanted an appt to call our office back

## 2017-07-10 NOTE — Progress Notes (Signed)
No ICM remote transmission received for 07/03/2017 and next ICM transmission scheduled for 07/31/2017.

## 2017-07-15 IMAGING — CR DG CHEST 2V
2 series · 2 of 2 positions shown · non-contrast
Comparison: 04/12/2014 and prior radiographs

CLINICAL DATA: Arrhythmia and hiccups for 3 days.

EXAM:
CHEST  2 VIEW

[chest pa]
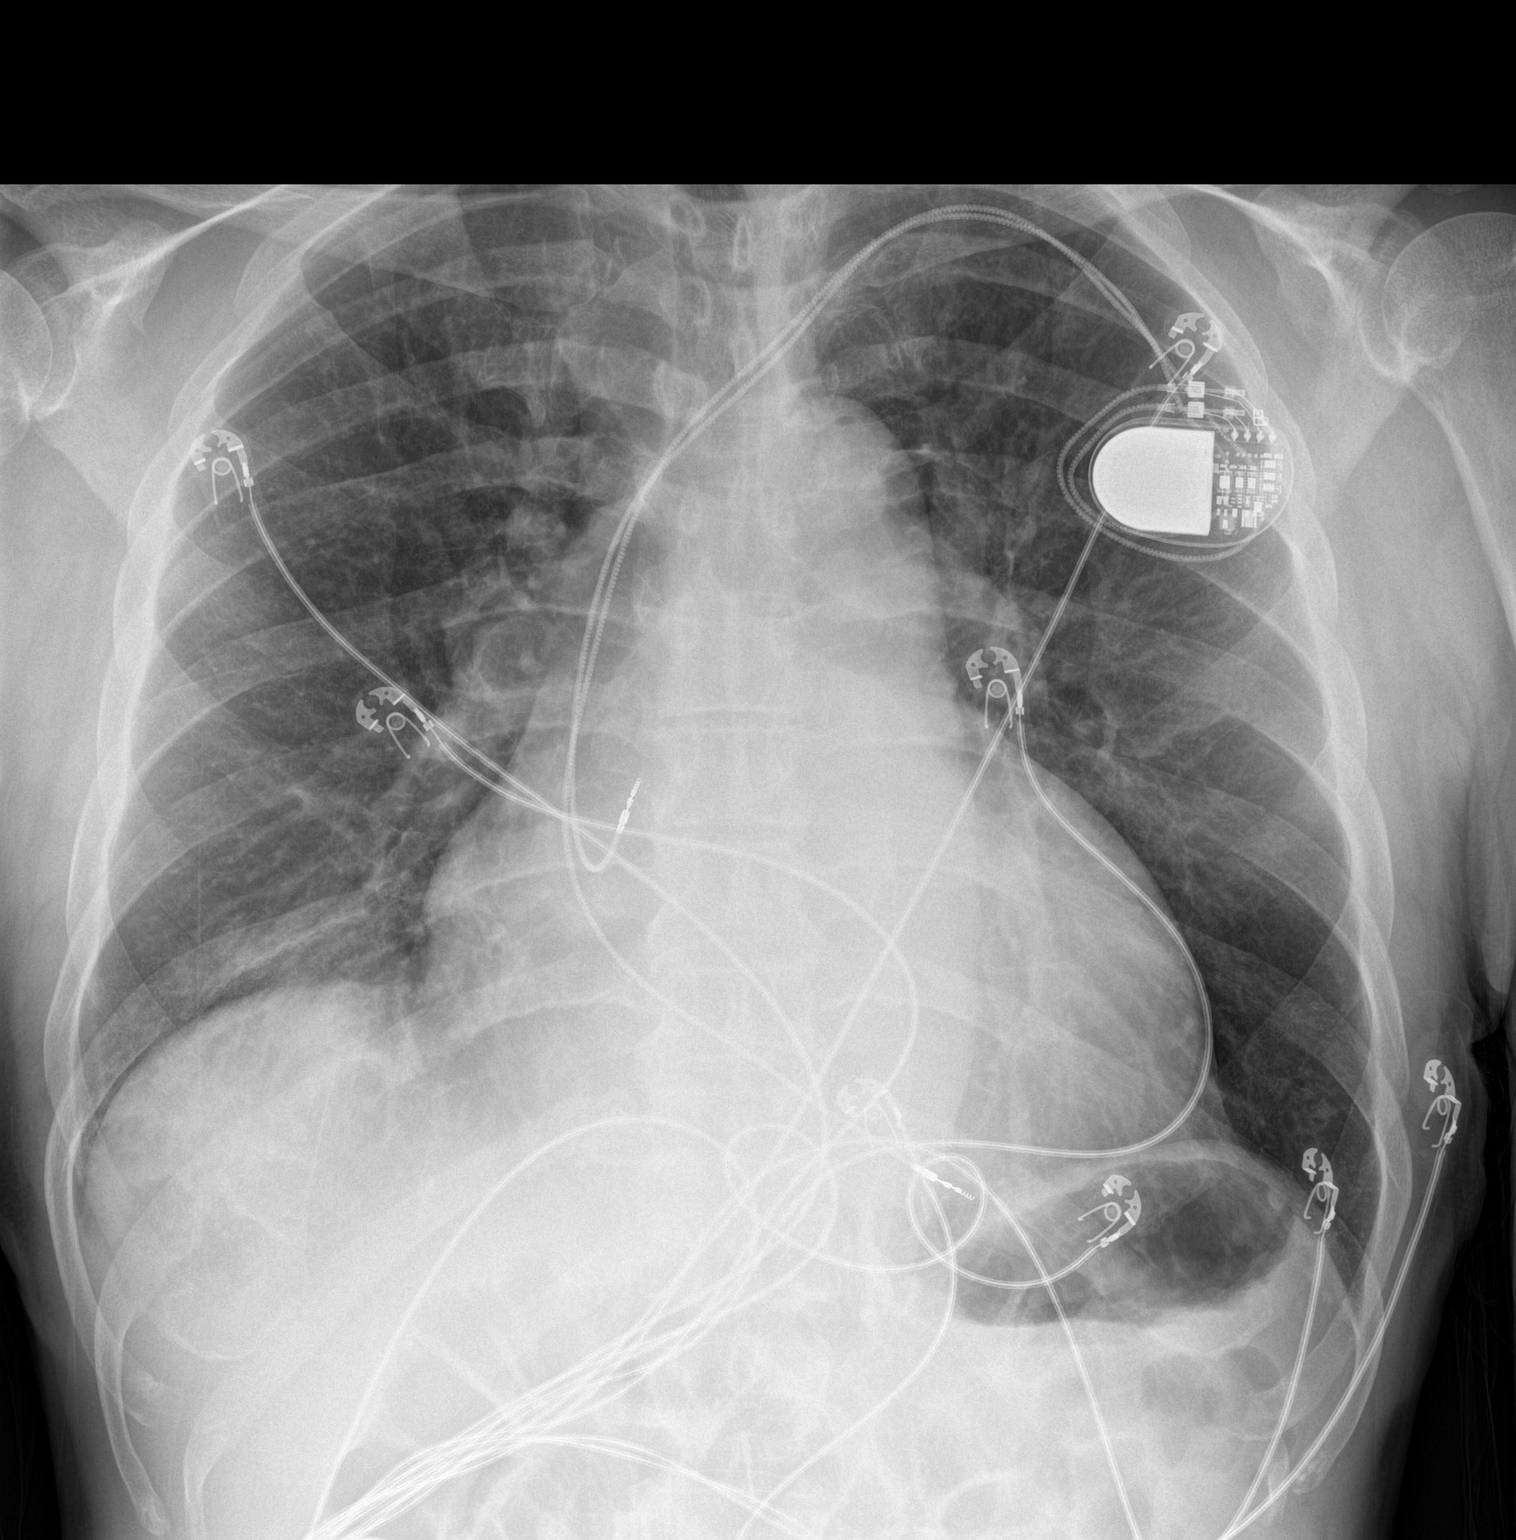

[chest lat]
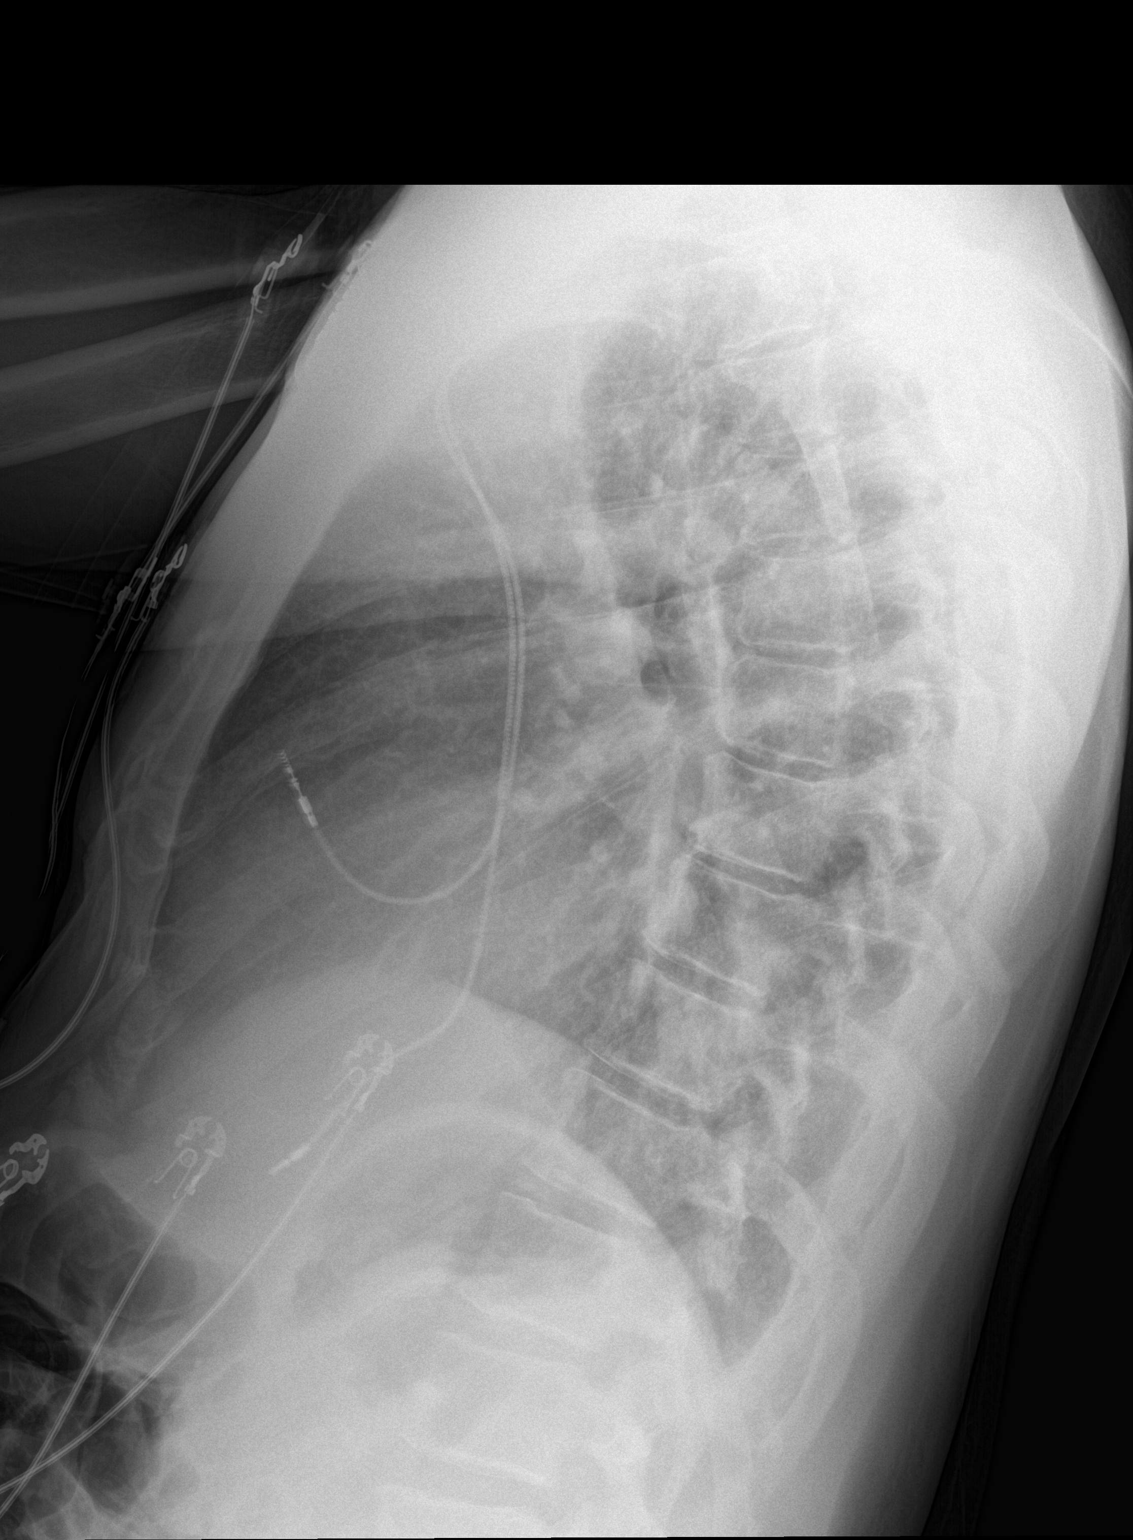

[2 of 2 positions shown; findings below may reference images not displayed]

FINDINGS: Cardiomegaly and left sided pacemaker again noted.

Right lower lobe airspace disease is compatible with pneumonia.

There is no evidence of pulmonary edema, suspicious pulmonary
nodule/mass, pleural effusion, or pneumothorax. No acute bony
abnormalities are identified.
IMPRESSION: Right lower lobe airspace disease compatible with pneumonia.
Radiographic follow-up to resolution is recommended.

Cardiomegaly.

## 2017-07-16 MED FILL — DIGOXIN 0.125 MG TABLET: 125 | 30 days supply | Qty: 30 | Fill #1

## 2017-07-16 MED FILL — SPIRONOLACTONE 25 MG TABLET: 25 | 30 days supply | Qty: 30 | Fill #1

## 2017-07-17 MED FILL — ELIQUIS 5 MG TABLET: 5 | 30 days supply | Qty: 60 | Fill #6

## 2017-07-29 MED FILL — BISOPROLOL FUMARATE 5 MG TA: 5 | 30 days supply | Qty: 30 | Fill #1

## 2017-07-29 MED FILL — hydrALAZINE HCL 25 MG TABS: 25 | 30 days supply | Qty: 45 | Fill #3

## 2017-07-31 ENCOUNTER — Ambulatory Visit (INDEPENDENT_AMBULATORY_CARE_PROVIDER_SITE_OTHER): Payer: Medicare Other

## 2017-07-31 ENCOUNTER — Telehealth: Payer: Self-pay

## 2017-07-31 ENCOUNTER — Telehealth: Payer: Self-pay | Admitting: Cardiology

## 2017-07-31 DIAGNOSIS — Z95810 Presence of automatic (implantable) cardiac defibrillator: Secondary | ICD-10-CM | POA: Diagnosis not present

## 2017-07-31 DIAGNOSIS — I5042 Chronic combined systolic (congestive) and diastolic (congestive) heart failure: Secondary | ICD-10-CM | POA: Diagnosis not present

## 2017-07-31 NOTE — Telephone Encounter (Signed)
New message:        1. Has your device fired? No  2. Is you device beeping? No  3. Are you experiencing draining or swelling at device site? No  4. Are you calling to see if we received your device transmission? Pt states he was supposed to do a transmit today but didn't realize it was unplugged. Pt states he will do it later today or tomorrow.  5. Have you passed out? No    Please route to [place]

## 2017-07-31 NOTE — Telephone Encounter (Signed)
I spoke with the patient about this.

## 2017-08-01 NOTE — Progress Notes (Signed)
EPIC Encounter for ICM Monitoring  Patient Name: Philip Chavez is a 67 y.o. male Date: 08/01/2017 Primary Care Physican: Nicolette Bang, DO Primary [place] Electrophysiologist: Lovena Le Dry Weight: 217lbs  RV Pacing:97.2%        Heart Failure questions reviewed, pt asymptomatic.   Thoracic impedance normal.  Prescribed dosage: Furosemide 20 mg 1 tablet twice a day  Labs: 11/20/2016 Creatinine 1.24, BUN 14, Potassium 4.0, Sodium 136, EGFR 59->60 08/20/2016 Creatinine 1.36, BUN 18, Potassium 4.0, Sodium 136, EGFR 53->60 06/18/2016 Creatinine 1.25, BUN 12, Potassium 4.0, Sodium 137, EGFR 59->60 03/19/2016 Creatinine 1.24, BUN 13, Potassium 4.0, Sodium 135, EGFR 59->60 02/19/2016 Creatinine 1.18, BUN 15, Potassium 4.1, Sodium 136, EGFR >60  11/14/2017Creatinine 1.32, BUN 12, Potassium 4.1, Sodium 136, EGFR 55->60  06/05/2017Creatinine 1.23, BUN 11, Potassium 3.9, Sodium 137, EGFR >60  Recommendations: No changes.    Encouraged to call for fluid symptoms.  Follow-up plan: ICM clinic phone appointment on 09/04/2017.    Copy of ICM check sent to Dr. Lovena Le.   3 month ICM trend: 07/31/2017    1 Year ICM trend:       Rosalene Billings, RN 08/01/2017 11:02 AM

## 2017-08-14 MED FILL — SPIRONOLACTONE 25 MG TABLET: 25 | 30 days supply | Qty: 30 | Fill #2

## 2017-08-15 MED FILL — DIGOXIN 0.125 MG TABLET: 125 | 30 days supply | Qty: 30 | Fill #2

## 2017-08-18 ENCOUNTER — Other Ambulatory Visit (HOSPITAL_COMMUNITY): Payer: Self-pay | Admitting: Cardiology

## 2017-08-18 MED FILL — ELIQUIS 5 MG TABLET: 5 | 30 days supply | Qty: 60 | Fill #0

## 2017-08-26 MED FILL — BISOPROLOL FUMARATE 5 MG TA: 5 | 30 days supply | Qty: 30 | Fill #2

## 2017-08-29 ENCOUNTER — Other Ambulatory Visit (HOSPITAL_COMMUNITY): Payer: Self-pay | Admitting: Internal Medicine

## 2017-09-01 MED FILL — hydrALAZINE HCL 25 MG TABS: 25 | 30 days supply | Qty: 45 | Fill #0

## 2017-09-04 ENCOUNTER — Ambulatory Visit (INDEPENDENT_AMBULATORY_CARE_PROVIDER_SITE_OTHER): Payer: Medicare Other

## 2017-09-04 ENCOUNTER — Telehealth: Payer: Self-pay

## 2017-09-04 ENCOUNTER — Ambulatory Visit (INDEPENDENT_AMBULATORY_CARE_PROVIDER_SITE_OTHER): Payer: Medicare Other | Admitting: *Deleted

## 2017-09-04 DIAGNOSIS — I5042 Chronic combined systolic (congestive) and diastolic (congestive) heart failure: Secondary | ICD-10-CM

## 2017-09-04 DIAGNOSIS — Z95810 Presence of automatic (implantable) cardiac defibrillator: Secondary | ICD-10-CM | POA: Diagnosis not present

## 2017-09-04 DIAGNOSIS — I428 Other cardiomyopathies: Secondary | ICD-10-CM

## 2017-09-04 DIAGNOSIS — I442 Atrioventricular block, complete: Secondary | ICD-10-CM

## 2017-09-04 NOTE — Telephone Encounter (Signed)
LMOVM reminding pt to send remote transmission.   

## 2017-09-05 ENCOUNTER — Telehealth: Payer: Self-pay

## 2017-09-05 NOTE — Telephone Encounter (Signed)
Remote ICM transmission received.  Attempted call to patient and left detailed message, per DPR, regarding transmission and next ICM scheduled for 10/09/2017.  Advised to return call for any fluid symptoms or questions.    

## 2017-09-05 NOTE — Progress Notes (Signed)
Remote ICD transmission.   

## 2017-09-05 NOTE — Progress Notes (Signed)
EPIC Encounter for ICM Monitoring  Patient Name: Philip Chavez is a 67 y.o. male Date: 09/05/2017 Primary Care Physican: Wilber Oliphant, MD Primary [place] Electrophysiologist: Lovena Le Dry Weight: Previous weight 217lbs  RV Pacing:95.3%  Clinical Status (31-Jul-2017 to 04-Sep-2017)  Treated VT/VF 0 episodes   AT/AF 5 episodes   Time in AT/AF 0.7 hr/day (3.0%)  25 hours in AT/AF Since Last Session   6 V. Sensing Episodes   3 VT-NS  Observations (1) (31-Jul-2017 to 04-Sep-2017)  AT/AF >= 6 hr for 3 days.  AT/AF Daily Burden > Threshold          Attempted call to patient and unable to reach.  Left detailed message, per DPR, regarding transmission.  Transmission reviewed.    Thoracic impedance normal  Prescribed dosage: Furosemide 20 mg 1 tablet twice a day  Labs: 11/20/2016 Creatinine 1.24, BUN 14, Potassium 4.0, Sodium 136, EGFR 59->60 08/20/2016 Creatinine 1.36, BUN 18, Potassium 4.0, Sodium 136, EGFR 53->60 06/18/2016 Creatinine 1.25, BUN 12, Potassium 4.0, Sodium 137, EGFR 59->60 03/19/2016 Creatinine 1.24, BUN 13, Potassium 4.0, Sodium 135, EGFR 59->60 02/19/2016 Creatinine 1.18, BUN 15, Potassium 4.1, Sodium 136, EGFR >60  11/14/2017Creatinine 1.32, BUN 12, Potassium 4.1, Sodium 136, EGFR 55->60  06/05/2017Creatinine 1.23, BUN 11, Potassium 3.9, Sodium 137, EGFR >60   Recommendations: Left voice mail with ICM number and encouraged to call if experiencing any fluid symptoms.  Follow-up plan: ICM clinic phone appointment on 10/09/2017.   Office appointment scheduled 09/29/2017 with Dr. Aundra Dubin.    Copy of ICM check sent to Dr. Lovena Le.   3 month ICM trend: 09/04/2017    AT/AF    1 Year ICM trend:       Rosalene Billings, RN 09/05/2017 11:16 AM

## 2017-09-15 ENCOUNTER — Other Ambulatory Visit (HOSPITAL_COMMUNITY): Payer: Self-pay | Admitting: Internal Medicine

## 2017-09-15 MED FILL — DIGOXIN 0.125 MG TABLET: 125 | 30 days supply | Qty: 30 | Fill #3

## 2017-09-16 MED FILL — SPIRONOLACTONE 25 MG TABLET: 25 | 30 days supply | Qty: 30 | Fill #0

## 2017-09-18 MED FILL — ELIQUIS 5 MG TABLET: 5 | 30 days supply | Qty: 60 | Fill #1

## 2017-09-24 MED FILL — BISOPROLOL FUMARATE 5 MG TA: 5 | 30 days supply | Qty: 30 | Fill #3

## 2017-09-29 ENCOUNTER — Encounter (HOSPITAL_COMMUNITY): Payer: Self-pay | Admitting: Cardiology

## 2017-09-29 ENCOUNTER — Ambulatory Visit (HOSPITAL_COMMUNITY)
Admission: RE | Admit: 2017-09-29 | Discharge: 2017-09-29 | Disposition: A | Discharge status: Home / Self Care | Payer: Medicare Other | Source: Ambulatory Visit | Attending: Cardiology | Admitting: Cardiology

## 2017-09-29 ENCOUNTER — Other Ambulatory Visit: Payer: Self-pay

## 2017-09-29 VITALS — BP 99/56 | HR 77 | Wt 211.5 lb

## 2017-09-29 DIAGNOSIS — I429 Cardiomyopathy, unspecified: Secondary | ICD-10-CM | POA: Insufficient documentation

## 2017-09-29 DIAGNOSIS — I442 Atrioventricular block, complete: Secondary | ICD-10-CM | POA: Diagnosis not present

## 2017-09-29 DIAGNOSIS — I480 Paroxysmal atrial fibrillation: Secondary | ICD-10-CM

## 2017-09-29 DIAGNOSIS — Z86718 Personal history of other venous thrombosis and embolism: Secondary | ICD-10-CM | POA: Diagnosis not present

## 2017-09-29 DIAGNOSIS — I5042 Chronic combined systolic (congestive) and diastolic (congestive) heart failure: Secondary | ICD-10-CM | POA: Diagnosis not present

## 2017-09-29 DIAGNOSIS — I4892 Unspecified atrial flutter: Secondary | ICD-10-CM | POA: Diagnosis not present

## 2017-09-29 DIAGNOSIS — Z8249 Family history of ischemic heart disease and other diseases of the circulatory system: Secondary | ICD-10-CM | POA: Insufficient documentation

## 2017-09-29 DIAGNOSIS — I5022 Chronic systolic (congestive) heart failure: Secondary | ICD-10-CM | POA: Diagnosis not present

## 2017-09-29 DIAGNOSIS — Z9889 Other specified postprocedural states: Secondary | ICD-10-CM | POA: Diagnosis not present

## 2017-09-29 DIAGNOSIS — I2510 Atherosclerotic heart disease of native coronary artery without angina pectoris: Secondary | ICD-10-CM | POA: Insufficient documentation

## 2017-09-29 DIAGNOSIS — Z79899 Other long term (current) drug therapy: Secondary | ICD-10-CM | POA: Insufficient documentation

## 2017-09-29 DIAGNOSIS — Z79891 Long term (current) use of opiate analgesic: Secondary | ICD-10-CM | POA: Diagnosis not present

## 2017-09-29 DIAGNOSIS — Z7901 Long term (current) use of anticoagulants: Secondary | ICD-10-CM | POA: Diagnosis not present

## 2017-09-29 DIAGNOSIS — E785 Hyperlipidemia, unspecified: Secondary | ICD-10-CM | POA: Diagnosis not present

## 2017-09-29 DIAGNOSIS — G4733 Obstructive sleep apnea (adult) (pediatric): Secondary | ICD-10-CM | POA: Insufficient documentation

## 2017-09-29 DIAGNOSIS — N189 Chronic kidney disease, unspecified: Secondary | ICD-10-CM | POA: Diagnosis not present

## 2017-09-29 LAB — BASIC METABOLIC PANEL
Anion gap: 7 (ref 5–15)
BUN: 14 mg/dL (ref 8–23)
CHLORIDE: 105 mmol/L (ref 98–111)
CO2: 29 mmol/L (ref 22–32)
CREATININE: 1.35 mg/dL — AB (ref 0.61–1.24)
Calcium: 9.3 mg/dL (ref 8.9–10.3)
GFR calc non Af Amer: 53 mL/min — ABNORMAL LOW (ref >60)
Glucose, Bld: 126 mg/dL — ABNORMAL HIGH (ref 70–99)
POTASSIUM: 3.9 mmol/L (ref 3.5–5.1)
SODIUM: 141 mmol/L (ref 135–145)

## 2017-09-29 LAB — DIGOXIN LEVEL: DIGOXIN LVL: 0.3 ng/mL — AB (ref 0.8–2.0)

## 2017-09-29 NOTE — Patient Instructions (Signed)
Labs today (will call for abnormal results, otherwise no news is good news)  Follow up in 4 months.

## 2017-09-30 NOTE — Progress Notes (Signed)
Patient ID: Philip Chavez, male   DOB: 01/18/1951, 67 y.o.   MRN: [id number] PCP: Dr. Gerlean Ren EP: Dr. Lovena Le HF cardiologist: Dr Aundra Dubin  67 y.o. with history of complete heart block/Medtronic PPM, cardiomyopathy, and atrial flutter s/p ablation presents for CHF clinic evaluation.  Patient developed complete heart block in 2/15 and had PPM placed at that time.  He paces his RV continuously.  In 2/15, EF was 50-55% by echo.  By 6/15, EF had fallen to 20-25%.  Cardiolite showed possible scar but no ischemia.  On 09/03/14, he was admitted with dyspnea and found to be in atrial flutter with RLL PNA and with volume overload.  Echo showed EF 15% with diffuse hypokinesis.  He was started on IV Lasix and IV amiodarone. He converted back to NSR.  He subsequently had atrial flutter ablation.  He had right and left heart cath in 9/16, showing nonobstructive CAD and optimized filling pressures with CI 2.09.  He subsequently had upgrade to CRT by Dr Lovena Le.  EF up to 30-35% in 4/17.  Subsequently, the LV lead dislodged.  He was offered lead revision, but preferred watchful waiting. Echo in 5/18 showed EF down to 20%.    [name] returns for followup of CHF today.  He continues to do well with minimal symptoms.  No significant exertional dyspnea.  No orthopnea/PND.  No chest pain.  No lightheadedness.  No BRPBR/melena.  No significant functional limitations. Weight down 3 lbs.   Medtronic device interrogated: Fluid index/impedance stable. He has had a number of short afib runs, nothing prolonged. He is no longer BiV pacing, RV pacing > 99% of the time.    Labs (8/16): K 3.9, creatinine 1.03, AST 106, ALT 173, HCT 45.1, LDL 132 Labs (9/16): K 3.9, creatinine 1.16, HCT 42.5 Labs (10/16): K 3.9, creatinine 1.32, HCT 39.9 Labs (2/17): K 4.1, creatinine 1.26, BNP 107, digoxin 0.3 Labs (07/10/2015): K 3.9 Creatinine 1.23  Labs (1/18): K 4.1, creatinine 1.18, digoxin 0.3 Labs (2/18): K 4, creatinine 1.24, digoxin 0.4 Labs  (5/18): K 4, creatinine 1.25, hgb 14.8, digoxin 0.5 Labs (10/18): K 4, creatinine 1.24, digoxin 0.2 Labs (4/19): K 3.8, creatinine 1.22, digoxin 0.5, LDL 69, HDL 40  PMH: 1. H/o complete heart block: Has Medtronic PPM, placed in 2/15.   2. Cardiomyopathy: Echo (2/15) prior to PPM placement with EF 50-55%.  Echo (6/15) with EF 20-25%,  Cardiolite at that time showed possible scar but no ischemia.  Echo (8/16) with EF 15%, diffuse hypokinesis, mildly dilated RV with normal systolic function, PA systolic pressure 69 mmHg.  LHC/RHC (9/16) with mean RA 4, PA 31/15 mean 20, mean PCWP 8, CI 2.09, 40% mLAD stenosis.  Upgrade to MDT CRT-D in 9/16.  - Echo (4/17) with EF 30-35%.   - LV lead dislodged and loss of BiV pacing.  - Echo (5/18) with EF 20%, mild LV dilation, normal RV size with mildly decreased systolic function, mild AI.  3. CKD 4. 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  5. Atrial tachycardia: paroxysmal. 6. Atrial flutter: s/p ablation in 8/16.  7. OSA 8. Hyperlipidemia 9. Shingles with post-herpetic neuralgia.  10. Atrial fibrillation: Paroxysmal.   FH: Father with CHF, complete heart block with PPM.  Sister with complete heart block and PPM.   SH: Retired Curator, nonsmoker, lives in [place]: All systems reviewed and negative except as per HPI  Current Outpatient Medications  Medication Sig Dispense Refill  . acetaminophen (TYLENOL) 325 MG  tablet Take 1-2 tablets (325-650 mg total) by mouth every 4 (four) hours as needed for mild pain.    . bisoprolol (ZEBETA) 5 MG tablet Take 1 tablet (5 mg total) by mouth daily. 30 tablet 6  . bisoprolol (ZEBETA) 5 MG tablet TAKE 1 TABLET BY MOUTH DAILY. 30 tablet 6  . digoxin (LANOXIN) 0.125 MG tablet TAKE 1 TABLET BY MOUTH DAILY. NEEDS OFFICE VISIT 30 tablet 3  . ELIQUIS 5 MG TABS tablet TAKE 1 TABLET BY MOUTH 2 TIMES DAILY. 60 tablet 6  . ENTRESTO 24-26 MG TAKE 1 TABLET BY MOUTH 2 TIMES DAILY. NEEDS OFFICE VISIT 60 tablet 1  .  furosemide (LASIX) 20 MG tablet TAKE 1 TABLET (20 MG TOTAL) 2 (TWO) TIMES DAILY BY MOUTH. 60 tablet 5  . hydrALAZINE (APRESOLINE) 25 MG tablet TAKE 1/2 TABLET BY MOUTH 3 TIMES DAILY. 45 tablet 3  . isosorbide mononitrate (IMDUR) 30 MG 24 hr tablet Take 1 tablet (30 mg total) by mouth daily. 30 tablet 3  . sacubitril-valsartan (ENTRESTO) 24-26 MG Take 1 tablet by mouth 2 (two) times daily. 60 tablet 6  . simvastatin (ZOCOR) 40 MG tablet Take 1 tablet (40 mg total) by mouth daily. 30 tablet 1  . spironolactone (ALDACTONE) 25 MG tablet TAKE 1 TABLET BY MOUTH DAILY. 30 tablet 3  . traMADol (ULTRAM) 50 MG tablet Take 1 tablet (50 mg total) by mouth every 6 (six) hours as needed. 30 tablet 0   No current facility-administered medications for this encounter.    BP (!) 99/56   Pulse 77   Wt 95.9 kg (211 lb 8 oz)   SpO2 100%   BMI 27.90 kg/m  General: NAD Neck: No JVD, no thyromegaly or thyroid nodule.  Lungs: Clear to auscultation bilaterally with normal respiratory effort. CV: Nondisplaced PMI.  Heart regular S1/S2, no S3/S4, no murmur.  No peripheral edema.  No carotid bruit.  Normal pedal pulses.  Abdomen: Soft, nontender, no hepatosplenomegaly, no distention.  Skin: Intact without lesions or rashes.  Neurologic: Alert and oriented x 3.  Psych: Normal affect. Extremities: No clubbing or cyanosis.  HEENT: Normal.   Assessment/Plan: 1. Chronic systolic CHF: Nonischemic cardiomyopathy.  EF 15% on echo in 8/16.  Patient had borderline reduced EF when PPM was placed in 2/15.  After that, EF fell significantly.  He has a history of complete heart block and father had CHF and CHB with pacemaker, and sounds like sister also had CHB with pacemaker. 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  He had CRT upgrade.  No significant CAD on last cath.  Echo in 4/17 showed improvement in EF to 30-35%.  However, his LV lead has dislodged and he is no longer BiV pacing,  now RV pacing > 99% of the time.  Echo in 5/18 showed EF down to 20%.  Symptomatically, he is still doing very well (NYHA class I-II) and no volume overload on exam or by Optivol. BP soft, no room to titrate up meds today.  - Continue bisoprolol 5 mg daily.  - Continue  Entresto 24/26 bid, spironolactone 25 daily, Lasix 20 bid, hydralazine 12.5 mg tid and Imdur 30 daily.  BMET today.   - Continue digoxin. Get digoxin level today.  - Echo in 5/18 showed EF reduced to 20%, possibly due to loss of BiV pacing.  He has minimal symptoms and has wanted to hold off on replacement of LV lead => if he worsens symptomatically,  would strongly recommend replacement of LF lead.  - We have discussed genetic evaluation to look for LMNA or SCN5A mutations that could be used for familial screening, he has wanted to hold off.  2. Atrial flutter: s/p ablation.  3. Atrial fibrillation: Currently in NSR but paroxysmal atrial fibrillation continues to be noted on device interrogation.  - Continue Eliquis. 3. PE/DVT: >1 year ago and associated with surgery.   4. Hyperlipidemia: He has mild CAD.   - No ASA with Eliquis.   - Continue simvastatin 40 mg daily, good lipids recently.  5. Complete heart block: Now RV pacing with loss of LV lead.   Followup in 4 months.   Loralie Champagne 09/30/2017

## 2017-10-02 LAB — CUP PACEART REMOTE DEVICE CHECK
Battery Remaining Longevity: 50 mo
Brady Statistic AP VS Percent: 0 %
Brady Statistic AS VS Percent: 3.48 %
Brady Statistic RA Percent Paced: 5.85 %
Brady Statistic RV Percent Paced: 95.31 %
HighPow Impedance: 59 Ohm
Implantable Lead Implant Date: 20160919
Implantable Lead Location: 753859
Implantable Lead Model: 4598
Implantable Lead Model: 5076
Implantable Lead Model: 6935
Lead Channel Impedance Value: 304 Ohm
Lead Channel Impedance Value: 323 Ohm
Lead Channel Impedance Value: 323 Ohm
Lead Channel Impedance Value: 380 Ohm
Lead Channel Impedance Value: 399 Ohm
Lead Channel Impedance Value: 494 Ohm
Lead Channel Impedance Value: 532 Ohm
Lead Channel Impedance Value: 532 Ohm
Lead Channel Pacing Threshold Amplitude: 0.875 V
Lead Channel Pacing Threshold Amplitude: 2.5 V
Lead Channel Pacing Threshold Pulse Width: 0.4 ms
Lead Channel Sensing Intrinsic Amplitude: 15.125 mV
Lead Channel Sensing Intrinsic Amplitude: 3.375 mV
Lead Channel Sensing Intrinsic Amplitude: 3.375 mV
Lead Channel Setting Pacing Pulse Width: 0.4 ms
MDC IDC LEAD IMPLANT DT: 20150209
MDC IDC LEAD IMPLANT DT: 20160919
MDC IDC LEAD LOCATION: 753858
MDC IDC LEAD LOCATION: 753860
MDC IDC MSMT BATTERY VOLTAGE: 2.96 V
MDC IDC MSMT LEADCHNL LV IMPEDANCE VALUE: 323 Ohm
MDC IDC MSMT LEADCHNL LV IMPEDANCE VALUE: 513 Ohm
MDC IDC MSMT LEADCHNL LV IMPEDANCE VALUE: 513 Ohm
MDC IDC MSMT LEADCHNL LV PACING THRESHOLD PULSEWIDTH: 0.4 ms
MDC IDC MSMT LEADCHNL RA IMPEDANCE VALUE: 437 Ohm
MDC IDC MSMT LEADCHNL RV IMPEDANCE VALUE: 323 Ohm
MDC IDC MSMT LEADCHNL RV PACING THRESHOLD AMPLITUDE: 0.875 V
MDC IDC MSMT LEADCHNL RV PACING THRESHOLD PULSEWIDTH: 0.4 ms
MDC IDC MSMT LEADCHNL RV SENSING INTR AMPL: 15.125 mV
MDC IDC PG IMPLANT DT: 20160919
MDC IDC SESS DTM: 20190801211134
MDC IDC SET LEADCHNL RA PACING AMPLITUDE: 1.75 V
MDC IDC SET LEADCHNL RV PACING AMPLITUDE: 2 V
MDC IDC SET LEADCHNL RV SENSING SENSITIVITY: 0.3 mV
MDC IDC STAT BRADY AP VP PERCENT: 6.14 %
MDC IDC STAT BRADY AS VP PERCENT: 90.37 %

## 2017-10-07 MED FILL — hydrALAZINE HCL 25 MG TABS: 25 | 30 days supply | Qty: 45 | Fill #1

## 2017-10-09 ENCOUNTER — Ambulatory Visit (INDEPENDENT_AMBULATORY_CARE_PROVIDER_SITE_OTHER): Payer: Medicare Other

## 2017-10-09 ENCOUNTER — Telehealth: Payer: Self-pay | Admitting: Cardiology

## 2017-10-09 DIAGNOSIS — I5042 Chronic combined systolic (congestive) and diastolic (congestive) heart failure: Secondary | ICD-10-CM

## 2017-10-09 DIAGNOSIS — Z95810 Presence of automatic (implantable) cardiac defibrillator: Secondary | ICD-10-CM

## 2017-10-09 NOTE — Telephone Encounter (Signed)
LMOVM reminding pt to send remote transmission.   

## 2017-10-10 NOTE — Progress Notes (Signed)
EPIC Encounter for ICM Monitoring  Patient Name: Philip Chavez is a 67 y.o. male Date: 10/10/2017 Primary Care Physican: Kim, Rachel E, MD Primary Cardiologist:[place] Electrophysiologist: Taylor Dry Weight: 211lbs  RV Pacing:98.2%      Heart Failure questions reviewed, pt asymptomatic.   Thoracic impedance normal.  Prescribed dosage: Furosemide 20 mg 1 tablet twice a day.  Labs: 11/20/2016 Creatinine 1.24, BUN 14, Potassium 4.0, Sodium 136, EGFR 59->60 08/20/2016 Creatinine 1.36, BUN 18, Potassium 4.0, Sodium 136, EGFR 53->60 06/18/2016 Creatinine 1.25, BUN 12, Potassium 4.0, Sodium 137, EGFR 59->60 03/19/2016 Creatinine 1.24, BUN 13, Potassium 4.0, Sodium 135, EGFR 59->60 02/19/2016 Creatinine 1.18, BUN 15, Potassium 4.1, Sodium 136, EGFR >60  11/14/2017Creatinine 1.32, BUN 12, Potassium 4.1, Sodium 136, EGFR 55->60  06/05/2017Creatinine 1.23, BUN 11, Potassium 3.9, Sodium 137, EGFR >60  Recommendations: No changes.   Encouraged to call for fluid symptoms.  Follow-up plan: ICM clinic phone appointment on 11/10/2017.    Copy of ICM check sent to Dr. Taylor.   3 month ICM trend: 10/09/2017    1 Year ICM trend:       Laurie S Short, RN 10/10/2017 7:56 AM   

## 2017-10-13 ENCOUNTER — Other Ambulatory Visit (HOSPITAL_COMMUNITY): Payer: Self-pay | Admitting: Internal Medicine

## 2017-10-13 MED FILL — DIGOXIN 0.125 MG TABLET: 125 | 30 days supply | Qty: 30 | Fill #0

## 2017-10-15 MED FILL — SPIRONOLACTONE 25 MG TABLET: 25 | 30 days supply | Qty: 30 | Fill #1

## 2017-10-20 MED FILL — ELIQUIS 5 MG TABLET: 5 | 30 days supply | Qty: 60 | Fill #2

## 2017-10-27 MED FILL — BISOPROLOL FUMARATE 5 MG TA: 5 | 30 days supply | Qty: 30 | Fill #4

## 2017-11-04 ENCOUNTER — Other Ambulatory Visit: Payer: Self-pay | Admitting: Cardiology

## 2017-11-04 ENCOUNTER — Telehealth: Payer: Self-pay

## 2017-11-04 NOTE — Telephone Encounter (Signed)
Returned patient call as requested by voice mail message.  He asked for recommendation of a physician that can remove nail from his finger.  Advised to call PCP for recommendation or to ask if that service can do the procedure. He said he will give them a call.

## 2017-11-05 MED FILL — hydrALAZINE HCL 25 MG TABS: 25 | 30 days supply | Qty: 45 | Fill #2

## 2017-11-10 ENCOUNTER — Ambulatory Visit (INDEPENDENT_AMBULATORY_CARE_PROVIDER_SITE_OTHER): Payer: Medicare Other

## 2017-11-10 ENCOUNTER — Telehealth: Payer: Self-pay

## 2017-11-10 DIAGNOSIS — Z95810 Presence of automatic (implantable) cardiac defibrillator: Secondary | ICD-10-CM | POA: Diagnosis not present

## 2017-11-10 DIAGNOSIS — I5042 Chronic combined systolic (congestive) and diastolic (congestive) heart failure: Secondary | ICD-10-CM

## 2017-11-10 NOTE — Telephone Encounter (Signed)
LMOVM reminding pt to send remote transmission.   

## 2017-11-11 NOTE — Progress Notes (Signed)
EPIC Encounter for ICM Monitoring  Patient Name: Philip Chavez is a 67 y.o. male Date: 11/11/2017 Primary Care Physican: Wilber Oliphant, MD Primary [place] Electrophysiologist: Lovena Le Dry Weight:209lbs  RV Pacing:97.8%   Monitored  VT (Off)  VT-NS(>4 beats, >162 bpm)              5  High Rate-NS                                     1  SVT:VT/VF Rx Withheld                     0  V. Oversensing-TWave Rx Withheld 0  V. Oversensing-Noise Rx Withheld   0  AT/AF                                                 4 episodes  Time in AT/AF 0.7 hr/day (2.8%)  Longest AT/AF 13 hours   AT/AF >= 6 hr for 2 days.    Heart Failure questions reviewed, pt asymptomatic.   Thoracic impedance normal.  Prescribed: Furosemide 20 mg 1 tablet twice a day.  Labs: 09/29/2017 Creatinine 1.35, BUN 14, Potassium 3.9, Sodium 141, EGFR >60 05/23/2017 Creatinine 1.22, BUN 12, Potassium 3.8, Sodium 137, EGFR >60  A complete set of results can be found in Results Review.  Recommendations: No changes.  Encouraged to call for fluid symptoms.  Follow-up plan: ICM clinic phone appointment on 12/11/2017.    Copy of ICM check sent to Dr. Lovena Le.   3 month ICM trend: 11/11/2017    1 Year ICM trend:       Rosalene Billings, RN 11/11/2017 9:56 AM

## 2017-11-12 MED FILL — SPIRONOLACTONE 25 MG TABLET: 25 | 30 days supply | Qty: 30 | Fill #2

## 2017-11-12 MED FILL — DIGOXIN 0.125 MG TABLET: 125 | 30 days supply | Qty: 30 | Fill #1

## 2017-11-18 MED FILL — ELIQUIS 5 MG TABLET: 5 | 30 days supply | Qty: 60 | Fill #3

## 2017-11-24 ENCOUNTER — Other Ambulatory Visit: Payer: Self-pay

## 2017-11-25 MED ORDER — FUROSEMIDE 20 MG PO TABS: 20.0000 mg | ORAL_TABLET | Freq: Two times a day (BID) | ORAL | 5 refills | Status: DC

## 2017-11-27 MED FILL — BISOPROLOL FUMARATE 5 MG TA: 5 | 30 days supply | Qty: 30 | Fill #5

## 2017-12-01 ENCOUNTER — Other Ambulatory Visit (HOSPITAL_COMMUNITY): Payer: Self-pay | Admitting: Cardiology

## 2017-12-08 MED FILL — hydrALAZINE HCL 25 MG TABS: 25 | 30 days supply | Qty: 45 | Fill #3

## 2017-12-11 ENCOUNTER — Telehealth: Payer: Self-pay

## 2017-12-11 ENCOUNTER — Ambulatory Visit (INDEPENDENT_AMBULATORY_CARE_PROVIDER_SITE_OTHER): Payer: Medicare Other

## 2017-12-11 ENCOUNTER — Ambulatory Visit (INDEPENDENT_AMBULATORY_CARE_PROVIDER_SITE_OTHER): Payer: Medicare Other | Admitting: *Deleted

## 2017-12-11 DIAGNOSIS — Z95810 Presence of automatic (implantable) cardiac defibrillator: Secondary | ICD-10-CM | POA: Diagnosis not present

## 2017-12-11 DIAGNOSIS — I428 Other cardiomyopathies: Secondary | ICD-10-CM

## 2017-12-11 DIAGNOSIS — I5042 Chronic combined systolic (congestive) and diastolic (congestive) heart failure: Secondary | ICD-10-CM | POA: Diagnosis not present

## 2017-12-11 NOTE — Telephone Encounter (Signed)
Spoke with pt and reminded pt of remote transmission that is due today. Pt verbalized understanding.   

## 2017-12-12 MED FILL — DIGOXIN 0.125 MG TABLET: 125 | 30 days supply | Qty: 30 | Fill #2

## 2017-12-12 NOTE — Progress Notes (Signed)
EPIC Encounter for ICM Monitoring  Patient Name: Philip Chavez is a 67 y.o. male Date: 12/12/2017 Primary Care Physican: Wilber Oliphant, MD Primary [place] Electrophysiologist: Lovena Le BiV Pacing:98.3%       Last Weight: 209lbs   Monitored  VT (Off) VT-NS (>4 beats, >162 bpm)    3  Clinical Status (11-Nov-2017 to 11-Dec-2017) Treated VT/VF 0 episodes  AT/AF 3 episodes Time in AT/AF 0.1 hr/day (0.6%) Longest AT/AF 4 hours         Heart Failure questions reviewed, pt asymptomatic.   Thoracic impedance normal.   Prescribed: Furosemide 20 mg 1 tablet twice a day.  Labs: 09/29/2017 Creatinine 1.35, BUN 14, Potassium 3.9, Sodium 141, EGFR >60 05/23/2017 Creatinine 1.22, BUN 12, Potassium 3.8, Sodium 137, EGFR >60  A complete set of results can be found in Results Review.  Recommendations: No changes.   Encouraged to call for fluid symptoms.  Follow-up plan: ICM clinic phone appointment on 01/12/2018.    Copy of ICM check sent to Dr. Lovena Le.   3 month ICM trend: 12/12/2017    1 Year ICM trend:       Rosalene Billings, RN 12/12/2017 10:48 AM

## 2017-12-14 ENCOUNTER — Encounter: Payer: Self-pay | Admitting: Cardiology

## 2017-12-14 NOTE — Progress Notes (Signed)
Remote ICD transmission.   

## 2017-12-15 MED FILL — SPIRONOLACTONE 25 MG TABLET: 25 | 30 days supply | Qty: 30 | Fill #3

## 2017-12-17 MED FILL — ELIQUIS 5 MG TABLET: 5 | 30 days supply | Qty: 60 | Fill #4

## 2017-12-30 MED FILL — BISOPROLOL FUMARATE 5 MG TA: 5 | 30 days supply | Qty: 30 | Fill #6

## 2018-01-06 ENCOUNTER — Other Ambulatory Visit (HOSPITAL_COMMUNITY): Payer: Self-pay | Admitting: Cardiology

## 2018-01-06 MED FILL — hydrALAZINE HCL 25 MG TABS: 25 | 30 days supply | Qty: 45 | Fill #0

## 2018-01-13 ENCOUNTER — Other Ambulatory Visit (HOSPITAL_COMMUNITY): Payer: Self-pay | Admitting: Internal Medicine

## 2018-01-13 MED FILL — DIGOXIN 0.125 MG TABLET: 125 | 30 days supply | Qty: 30 | Fill #3

## 2018-01-13 MED FILL — SPIRONOLACTONE 25 MG TABLET: 25 | 30 days supply | Qty: 30 | Fill #0

## 2018-01-19 MED FILL — ELIQUIS 5 MG TABLET: 5 | 30 days supply | Qty: 60 | Fill #5

## 2018-01-19 MED FILL — ENTRESTO 24 MG-26 MG TABLET: 24-26 | 30 days supply | Qty: 60 | Fill #0

## 2018-01-21 ENCOUNTER — Other Ambulatory Visit: Payer: Self-pay | Admitting: Cardiology

## 2018-01-27 NOTE — Progress Notes (Signed)
No ICM remote transmission received for 01/12/2018 and next ICM transmission scheduled for 02/09/2018.

## 2018-01-29 ENCOUNTER — Other Ambulatory Visit (HOSPITAL_COMMUNITY): Payer: Self-pay | Admitting: Cardiology

## 2018-01-29 MED FILL — BISOPROLOL FUMARATE 5 MG TA: 5 | 30 days supply | Qty: 30 | Fill #0

## 2018-02-02 ENCOUNTER — Ambulatory Visit (HOSPITAL_COMMUNITY)
Admission: RE | Admit: 2018-02-02 | Discharge: 2018-02-02 | Disposition: A | Discharge status: Home / Self Care | Payer: Medicare Other | Source: Ambulatory Visit | Attending: Cardiology | Admitting: Cardiology

## 2018-02-02 ENCOUNTER — Encounter (HOSPITAL_COMMUNITY): Payer: Self-pay | Admitting: Cardiology

## 2018-02-02 VITALS — BP 105/68 | HR 75 | Wt 213.6 lb

## 2018-02-02 DIAGNOSIS — I2510 Atherosclerotic heart disease of native coronary artery without angina pectoris: Secondary | ICD-10-CM | POA: Diagnosis not present

## 2018-02-02 DIAGNOSIS — Z9889 Other specified postprocedural states: Secondary | ICD-10-CM | POA: Diagnosis not present

## 2018-02-02 DIAGNOSIS — E785 Hyperlipidemia, unspecified: Secondary | ICD-10-CM | POA: Insufficient documentation

## 2018-02-02 DIAGNOSIS — G4733 Obstructive sleep apnea (adult) (pediatric): Secondary | ICD-10-CM | POA: Diagnosis not present

## 2018-02-02 DIAGNOSIS — I480 Paroxysmal atrial fibrillation: Secondary | ICD-10-CM | POA: Insufficient documentation

## 2018-02-02 DIAGNOSIS — I442 Atrioventricular block, complete: Secondary | ICD-10-CM | POA: Insufficient documentation

## 2018-02-02 DIAGNOSIS — I428 Other cardiomyopathies: Secondary | ICD-10-CM | POA: Insufficient documentation

## 2018-02-02 DIAGNOSIS — Z79899 Other long term (current) drug therapy: Secondary | ICD-10-CM | POA: Insufficient documentation

## 2018-02-02 DIAGNOSIS — N189 Chronic kidney disease, unspecified: Secondary | ICD-10-CM | POA: Insufficient documentation

## 2018-02-02 DIAGNOSIS — Z7901 Long term (current) use of anticoagulants: Secondary | ICD-10-CM | POA: Diagnosis not present

## 2018-02-02 DIAGNOSIS — Z8249 Family history of ischemic heart disease and other diseases of the circulatory system: Secondary | ICD-10-CM | POA: Insufficient documentation

## 2018-02-02 DIAGNOSIS — I4892 Unspecified atrial flutter: Secondary | ICD-10-CM

## 2018-02-02 DIAGNOSIS — I5022 Chronic systolic (congestive) heart failure: Secondary | ICD-10-CM

## 2018-02-02 DIAGNOSIS — Z86718 Personal history of other venous thrombosis and embolism: Secondary | ICD-10-CM | POA: Insufficient documentation

## 2018-02-02 LAB — BASIC METABOLIC PANEL
Anion gap: 9 (ref 5–15)
BUN: 15 mg/dL (ref 8–23)
CO2: 23 mmol/L (ref 22–32)
Calcium: 9.3 mg/dL (ref 8.9–10.3)
Chloride: 105 mmol/L (ref 98–111)
Creatinine, Ser: 1.22 mg/dL (ref 0.61–1.24)
GFR calc Af Amer: >60 mL/min (ref >60)
GFR calc non Af Amer: >60 mL/min (ref >60)
Glucose, Bld: 92 mg/dL (ref 70–99)
POTASSIUM: 4 mmol/L (ref 3.5–5.1)
Sodium: 137 mmol/L (ref 135–145)

## 2018-02-02 LAB — CBC
HCT: 46.3 % (ref 39.0–52.0)
Hemoglobin: 15.4 g/dL (ref 13.0–17.0)
MCH: 31.6 pg (ref 26.0–34.0)
MCHC: 33.3 g/dL (ref 30.0–36.0)
MCV: 95.1 fL (ref 80.0–100.0)
Platelets: 198 10*3/uL (ref 150–400)
RBC: 4.87 MIL/uL (ref 4.22–5.81)
RDW: 13.5 % (ref 11.5–15.5)
WBC: 6 10*3/uL (ref 4.0–10.5)
nRBC: 0 % (ref 0.0–0.2)

## 2018-02-02 NOTE — Patient Instructions (Signed)
Today you have been seen at the Heart failure clinic at [place] had Lab work done today:  We will call you if your lab work is abnormal.. No news is good news!!   Follow up with Dr. Aundra Dubin in 3 months

## 2018-02-03 NOTE — Progress Notes (Signed)
Patient ID: Philip Chavez, male   DOB: 15-Aug-1950, 67 y.o.   MRN: [id number] PCP: Dr. Gerlean Ren EP: Dr. Lovena Le HF cardiologist: Dr Aundra Dubin  67 y.o. with history of complete heart block/Medtronic PPM, cardiomyopathy, and atrial flutter s/p ablation presents for CHF clinic evaluation.  Patient developed complete heart block in 2/15 and had PPM placed at that time.  He paces his RV continuously.  In 2/15, EF was 50-55% by echo.  By 6/15, EF had fallen to 20-25%.  Cardiolite showed possible scar but no ischemia.  On 09/03/14, he was admitted with dyspnea and found to be in atrial flutter with RLL PNA and with volume overload.  Echo showed EF 15% with diffuse hypokinesis.  He was started on IV Lasix and IV amiodarone. He converted back to NSR.  He subsequently had atrial flutter ablation.  He had right and left heart cath in 9/16, showing nonobstructive CAD and optimized filling pressures with CI 2.09.  He subsequently had upgrade to CRT by Dr Lovena Le.  EF up to 30-35% in 4/17.  Subsequently, the LV lead dislodged.  He was offered lead revision, but preferred watchful waiting. Echo in 5/18 showed EF down to 20%.    Philip Chavez returns for followup of CHF today.  He continues to have minimal symptoms.  No exertional dyspnea or chest pain.  No orthopnea/PND.  No lightheadedness.  No particular limitations to his daily activities.  Weight up 2 lbs.    Labs (8/16): K 3.9, creatinine 1.03, AST 106, ALT 173, HCT 45.1, LDL 132 Labs (9/16): K 3.9, creatinine 1.16, HCT 42.5 Labs (10/16): K 3.9, creatinine 1.32, HCT 39.9 Labs (2/17): K 4.1, creatinine 1.26, BNP 107, digoxin 0.3 Labs (07/10/2015): K 3.9 Creatinine 1.23  Labs (1/18): K 4.1, creatinine 1.18, digoxin 0.3 Labs (2/18): K 4, creatinine 1.24, digoxin 0.4 Labs (5/18): K 4, creatinine 1.25, hgb 14.8, digoxin 0.5 Labs (10/18): K 4, creatinine 1.24, digoxin 0.2 Labs (4/19): K 3.8, creatinine 1.22, digoxin 0.5, LDL 69, HDL 40 Labs (8/19): K 3.9, creatinine 1.35,  digoxin 0.3  PMH: 1. H/o complete heart block: Has Medtronic PPM, placed in 2/15.   2. Cardiomyopathy: Echo (2/15) prior to PPM placement with EF 50-55%.  Echo (6/15) with EF 20-25%,  Cardiolite at that time showed possible scar but no ischemia.  Echo (8/16) with EF 15%, diffuse hypokinesis, mildly dilated RV with normal systolic function, PA systolic pressure 69 mmHg.  LHC/RHC (9/16) with mean RA 4, PA 31/15 mean 20, mean PCWP 8, CI 2.09, 40% mLAD stenosis.  Upgrade to MDT CRT-D in 9/16.  - Echo (4/17) with EF 30-35%.   - LV lead dislodged and loss of BiV pacing.  - Echo (5/18) with EF 20%, mild LV dilation, normal RV size with mildly decreased systolic function, mild AI.  3. CKD 4. 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  5. Atrial tachycardia: paroxysmal. 6. Atrial flutter: s/p ablation in 8/16.  7. OSA 8. Hyperlipidemia 9. Shingles with post-herpetic neuralgia.  10. Atrial fibrillation: Paroxysmal.   FH: Father with CHF, complete heart block with PPM.  Sister with complete heart block and PPM.   SH: Retired Curator, nonsmoker, lives in [place]: All systems reviewed and negative except as per HPI  Current Outpatient Medications  Medication Sig Dispense Refill  . acetaminophen (TYLENOL) 325 MG tablet Take 1-2 tablets (325-650 mg total) by mouth every 4 (four) hours as needed for mild pain.    . bisoprolol (ZEBETA) 5 MG tablet  Take 1 tablet (5 mg total) by mouth daily. 30 tablet 6  . digoxin (LANOXIN) 0.125 MG tablet TAKE 1 TABLET BY MOUTH DAILY. NEEDS OFFICE VISIT 30 tablet 3  . ELIQUIS 5 MG TABS tablet TAKE 1 TABLET BY MOUTH 2 TIMES DAILY. 60 tablet 6  . furosemide (LASIX) 20 MG tablet TAKE 1 TABLET (20 MG TOTAL) 2 (TWO) TIMES DAILY BY MOUTH. 60 tablet 5  . hydrALAZINE (APRESOLINE) 25 MG tablet TAKE 1/2 TABLET BY MOUTH 3 TIMES DAILY. 45 tablet 3  . isosorbide mononitrate (IMDUR) 30 MG 24 hr tablet Take 1 tablet (30 mg total) by mouth daily. 30 tablet 3  .  sacubitril-valsartan (ENTRESTO) 24-26 MG Take 1 tablet by mouth 2 (two) times daily. 60 tablet 6  . simvastatin (ZOCOR) 40 MG tablet Take 1 tablet (40 mg total) by mouth daily. 30 tablet 1  . spironolactone (ALDACTONE) 25 MG tablet TAKE 1 TABLET BY MOUTH DAILY. 30 tablet 3  . traMADol (ULTRAM) 50 MG tablet Take 1 tablet (50 mg total) by mouth every 6 (six) hours as needed. (Patient not taking: Reported on 02/02/2018) 30 tablet 0   No current facility-administered medications for this encounter.    BP 105/68   Pulse 75   Wt 96.9 kg (213 lb 9.6 oz)   SpO2 98%   BMI 28.18 kg/m  General: NAD Neck: No JVD, no thyromegaly or thyroid nodule.  Lungs: Clear to auscultation bilaterally with normal respiratory effort. CV: Nondisplaced PMI.  Heart regular S1/S2, no S3/S4, no murmur.  No peripheral edema.  No carotid bruit.  Normal pedal pulses.  Abdomen: Soft, nontender, no hepatosplenomegaly, no distention.  Skin: Intact without lesions or rashes.  Neurologic: Alert and oriented x 3.  Psych: Normal affect. Extremities: No clubbing or cyanosis.  HEENT: Normal.   Assessment/Plan: 1. Chronic systolic CHF: Nonischemic cardiomyopathy.  EF 15% on echo in 8/16.  Patient had borderline reduced EF when PPM was placed in 2/15.  After that, EF fell significantly.  He has a history of complete heart block and father had CHF and CHB with pacemaker, and sounds like sister also had CHB with pacemaker. 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  He had CRT upgrade.  No significant CAD on last cath.  Echo in 4/17 showed improvement in EF to 30-35%.  However, his LV lead has dislodged and he is no longer BiV pacing, now RV pacing > 99% of the time.  Echo in 5/18 showed EF down to 20%.  Symptomatically, he is still doing very well (NYHA class I-II) with no volume overload on exam.  He is reluctant to titrate up any of his medications so I will have him continue the same doses  today.  - Continue bisoprolol 5 mg daily.  - Continue  Entresto 24/26 bid, spironolactone 25 daily, Lasix 20 bid, hydralazine 12.5 mg tid and Imdur 30 daily.  BMET today.   - Continue digoxin. Get digoxin level today.  - Echo in 5/18 showed EF reduced to 20%, possibly due to loss of BiV pacing.  He has minimal symptoms and has wanted to hold off on replacement of LV lead => if he worsens symptomatically, would strongly recommend replacement of LF lead.  - We have discussed genetic evaluation to look for LMNA or SCN5A mutations that could be used for familial screening, he has wanted to hold off.  2. Atrial flutter: s/p ablation.  3. Atrial fibrillation: Currently in NSR.  -  Continue Eliquis, CBC today. 3. PE/DVT: >1 year ago and associated with surgery.   4. Hyperlipidemia: He has mild CAD.   - No ASA with Eliquis.   - Continue simvastatin 40 mg daily, good lipids when last checked.   5. Complete heart block: Now RV pacing with loss of LV lead.   Followup in 3 months.   Loralie Champagne 02/03/2018

## 2018-02-05 MED FILL — hydrALAZINE HCL 25 MG TABS: 25 | 30 days supply | Qty: 45 | Fill #1

## 2018-02-09 ENCOUNTER — Ambulatory Visit (INDEPENDENT_AMBULATORY_CARE_PROVIDER_SITE_OTHER): Payer: Medicare Other

## 2018-02-09 DIAGNOSIS — Z95810 Presence of automatic (implantable) cardiac defibrillator: Secondary | ICD-10-CM | POA: Diagnosis not present

## 2018-02-09 DIAGNOSIS — I5022 Chronic systolic (congestive) heart failure: Secondary | ICD-10-CM

## 2018-02-09 NOTE — Progress Notes (Signed)
EPIC Encounter for ICM Monitoring  Patient Name: Philip Chavez is a 68 y.o. male Date: 02/09/2018 Primary Care Physican: Wilber Oliphant, MD Primary [place] Electrophysiologist: Lovena Le BiV Pacing:98.4% Last Weight: 209lbs    Clinical Status (12-Dec-2017 to 09-Feb-2018)  Treated VT/VF 0 episodes   AT/AF 4 episodes   Time in AT/AF 0.3 hr/day (1.1%)  Observations (1) (12-Dec-2017 to 09-Feb-2018)  AT/AF >= 6 hr for 1 days.      Transmission received.   Thoracic impedance normal.  Prescribed dosage: Furosemide 20 mg 1 tablet twice a day.  Labs: 09/29/2017 Creatinine1.35, BUN14, Potassium3.9, Sodium141, EGFR>60 05/23/2017 Creatinine1.22, BUN12, Potassium3.8, Sodium137, EGFR>60 A complete set of results can be found in Results Review.  Recommendations: None.  Follow-up plan: ICM clinic phone appointment on 03/13/2018.    Copy of ICM check sent to Dr. Lovena Le.   3 month ICM trend: 02/09/2018    1 Year ICM trend:       Rosalene Billings, RN 02/09/2018 5:01 PM

## 2018-02-11 MED FILL — SPIRONOLACTONE 25 MG TABLET: 25 | 30 days supply | Qty: 30 | Fill #1

## 2018-02-12 ENCOUNTER — Other Ambulatory Visit (HOSPITAL_COMMUNITY): Payer: Self-pay | Admitting: Internal Medicine

## 2018-02-12 MED FILL — DIGOXIN 0.125 MG TABLET: 125 | 30 days supply | Qty: 30 | Fill #0

## 2018-02-13 LAB — CUP PACEART REMOTE DEVICE CHECK
Battery Remaining Longevity: 46 mo
Battery Voltage: 2.95 V
Brady Statistic AP VP Percent: 5.63 %
Brady Statistic AP VS Percent: 0 %
Brady Statistic AS VP Percent: 93.3 %
Brady Statistic AS VS Percent: 1.07 %
Brady Statistic RA Percent Paced: 5.49 %
Brady Statistic RV Percent Paced: 98.28 %
Date Time Interrogation Session: 20191108045929
HighPow Impedance: 63 Ohm
Implantable Lead Implant Date: 20150209
Implantable Lead Implant Date: 20160919
Implantable Lead Implant Date: 20160919
Implantable Lead Location: 753858
Implantable Lead Location: 753859
Implantable Lead Location: 753860
Implantable Lead Model: 4598
Implantable Lead Model: 5076
Implantable Lead Model: 6935
Implantable Pulse Generator Implant Date: 20160919
Lead Channel Impedance Value: 304 Ohm
Lead Channel Impedance Value: 323 Ohm
Lead Channel Impedance Value: 323 Ohm
Lead Channel Impedance Value: 342 Ohm
Lead Channel Impedance Value: 380 Ohm
Lead Channel Impedance Value: 399 Ohm
Lead Channel Impedance Value: 437 Ohm
Lead Channel Impedance Value: 513 Ohm
Lead Channel Impedance Value: 532 Ohm
Lead Channel Impedance Value: 570 Ohm
Lead Channel Pacing Threshold Amplitude: 0.75 V
Lead Channel Pacing Threshold Amplitude: 0.875 V
Lead Channel Pacing Threshold Amplitude: 2.5 V
Lead Channel Pacing Threshold Pulse Width: 0.4 ms
Lead Channel Pacing Threshold Pulse Width: 0.4 ms
Lead Channel Sensing Intrinsic Amplitude: 2.625 mV
Lead Channel Sensing Intrinsic Amplitude: 2.625 mV
Lead Channel Sensing Intrinsic Amplitude: 5.875 mV
Lead Channel Sensing Intrinsic Amplitude: 5.875 mV
Lead Channel Setting Pacing Amplitude: 2 V
Lead Channel Setting Pacing Pulse Width: 0.4 ms
Lead Channel Setting Sensing Sensitivity: 0.3 mV
MDC IDC MSMT LEADCHNL LV IMPEDANCE VALUE: 342 Ohm
MDC IDC MSMT LEADCHNL LV IMPEDANCE VALUE: 494 Ohm
MDC IDC MSMT LEADCHNL LV IMPEDANCE VALUE: 532 Ohm
MDC IDC MSMT LEADCHNL LV PACING THRESHOLD PULSEWIDTH: 0.4 ms
MDC IDC SET LEADCHNL RA PACING AMPLITUDE: 1.5 V

## 2018-02-17 MED FILL — ELIQUIS 5 MG TABLET: 5 | 30 days supply | Qty: 60 | Fill #6

## 2018-02-19 MED FILL — ENTRESTO 24 MG-26 MG TABLET: 24-26 | 30 days supply | Qty: 60 | Fill #1

## 2018-02-26 MED FILL — BISOPROLOL FUMARATE 5 MG TA: 5 | 30 days supply | Qty: 30 | Fill #1

## 2018-03-09 MED FILL — hydrALAZINE HCL 25 MG TABS: 25 | 30 days supply | Qty: 45 | Fill #2

## 2018-03-12 MED FILL — SPIRONOLACTONE 25 MG TABLET: 25 | 30 days supply | Qty: 30 | Fill #2

## 2018-03-12 MED FILL — DIGOXIN 0.125 MG TABLET: 125 | 30 days supply | Qty: 30 | Fill #1

## 2018-03-16 NOTE — Progress Notes (Signed)
No ICM remote transmission received for 03/13/2018 and next ICM transmission scheduled for 04/13/2018.

## 2018-03-18 ENCOUNTER — Other Ambulatory Visit (HOSPITAL_COMMUNITY): Payer: Self-pay | Admitting: Cardiology

## 2018-03-18 MED FILL — ELIQUIS 5 MG TABLET: 5 | 30 days supply | Qty: 60 | Fill #0

## 2018-03-19 MED FILL — ENTRESTO 24 MG-26 MG TABLET: 24-26 | 30 days supply | Qty: 60 | Fill #2

## 2018-03-26 ENCOUNTER — Telehealth (HOSPITAL_COMMUNITY): Payer: Self-pay | Admitting: *Deleted

## 2018-03-26 NOTE — Telephone Encounter (Signed)
Pt dropped of handicap parking certification form for completion.  Form completed and signed by Dr Aundra Dubin, pt is aware and will p/u at front desk.

## 2018-03-28 MED FILL — BISOPROLOL FUMARATE 5 MG TA: 5 | 30 days supply | Qty: 30 | Fill #2 | Status: TO

## 2018-04-03 ENCOUNTER — Encounter: Payer: Self-pay | Admitting: Cardiology

## 2018-04-09 MED FILL — hydrALAZINE HCL 25 MG TABS: 25 | 30 days supply | Qty: 45 | Fill #3

## 2018-04-11 MED FILL — DIGOXIN 0.125 MG TABLET: 125 | 30 days supply | Qty: 30 | Fill #2 | Status: TO

## 2018-04-13 ENCOUNTER — Telehealth: Payer: Self-pay

## 2018-04-13 NOTE — Telephone Encounter (Signed)
Spoke with patient to remind of missed remote transmission 

## 2018-04-14 MED FILL — SPIRONOLACTONE 25 MG TABLET: 25 | 30 days supply | Qty: 30 | Fill #3

## 2018-04-20 MED FILL — ENTRESTO 24 MG-26 MG TABLET: 24-26 | 30 days supply | Qty: 60 | Fill #3 | Status: TO

## 2018-04-20 MED FILL — ELIQUIS 5 MG TABLET: 5 | 30 days supply | Qty: 60 | Fill #1 | Status: TO

## 2018-04-22 ENCOUNTER — Telehealth (HOSPITAL_COMMUNITY): Payer: Self-pay

## 2018-04-22 NOTE — Telephone Encounter (Signed)
Called patient to reschedule appointment for May d/t current state of emergency with Corona virus.  Pt reports feeling well with no exposure to anyone with a fever or cough.  No recent travel.  Pt denies cardiac symptoms at this time.  Pt amendable to rescheduling appt from next week.  Pt encouraged to call office if any concerns at home ie change in status, refills etc. Appointment made for 8 weeks and appointment card mailed. Verbalized understanding.

## 2018-04-24 NOTE — Progress Notes (Addendum)
No ICM remote transmission received for 04/13/2018 and next ICM transmission scheduled for 05/11/2018.

## 2018-04-29 ENCOUNTER — Encounter (HOSPITAL_COMMUNITY): Payer: Medicare Other | Admitting: Cardiology

## 2018-04-29 MED FILL — BISOPROLOL FUMARATE 5 MG TA: 5 | 30 days supply | Qty: 30 | Fill #0

## 2018-05-11 ENCOUNTER — Other Ambulatory Visit (HOSPITAL_COMMUNITY): Payer: Self-pay | Admitting: Internal Medicine

## 2018-05-11 ENCOUNTER — Telehealth: Payer: Self-pay

## 2018-05-11 ENCOUNTER — Ambulatory Visit (INDEPENDENT_AMBULATORY_CARE_PROVIDER_SITE_OTHER): Payer: Medicare Other

## 2018-05-11 ENCOUNTER — Other Ambulatory Visit: Payer: Self-pay

## 2018-05-11 ENCOUNTER — Other Ambulatory Visit (HOSPITAL_COMMUNITY): Payer: Self-pay | Admitting: Cardiology

## 2018-05-11 DIAGNOSIS — Z95810 Presence of automatic (implantable) cardiac defibrillator: Secondary | ICD-10-CM | POA: Diagnosis not present

## 2018-05-11 DIAGNOSIS — I5022 Chronic systolic (congestive) heart failure: Secondary | ICD-10-CM | POA: Diagnosis not present

## 2018-05-11 MED FILL — SPIRONOLACTONE 25 MG TABS: 25 | 90 days supply | Qty: 90 | Fill #0

## 2018-05-11 MED FILL — hydrALAZINE HCL 25 MG TABS: 25 | 90 days supply | Qty: 135 | Fill #0

## 2018-05-11 MED FILL — DIGOXIN 0.125 MG TABLET: 125 | 30 days supply | Qty: 30 | Fill #0

## 2018-05-11 NOTE — Telephone Encounter (Signed)
Remote ICM transmission received.  Attempted call to patient and he reported he was busy and requested a call back.

## 2018-05-11 NOTE — Progress Notes (Signed)
EPIC Encounter for ICM Monitoring  Patient Name: Philip Chavez is a 68 y.o. male Date: 05/11/2018 Primary Care Physican: Wilber Oliphant, MD Primary [place] Electrophysiologist: Lovena Le BiV Pacing:97.9% Last Weight:209lbs      Clinical Status (16-Apr-2018 to 11-May-2018)  VT-NS (>4 beats, >162 bpm) 4  High Rate-NS 2 Episodes  AT/AF             2 Episodes  Time in AT/AF 0.5 hr/day (2.0%)  Longest AT/AF 12 hours  AT/AF >= 6 hr for 1 days.       Attempted call to patient and unable to reach.  Left detailed message per DPR regarding transmission. Transmission reviewed.    Report: Thoracic impedance normal.  Prescribed dosage: Furosemide 20 mg 1 tablet twice a day.  Labs: 09/29/2017 Creatinine1.35, BUN14, Potassium3.9, Sodium141, EGFR>60 05/23/2017 Creatinine1.22, BUN12, Potassium3.8, Sodium137, EGFR>60 A complete set of results can be found in Results Review.  Recommendations:  Left voice mail with ICM number and encouraged to call if experiencing any fluid symptoms.  Follow-up plan: ICM clinic phone appointment on 06/15/2018.    Copy of ICM check sent to Dr. Lovena Le.   3 month ICM trend: 05/11/2018    1 Year ICM trend:       Rosalene Billings, RN 05/11/2018 9:35 AM

## 2018-05-13 ENCOUNTER — Telehealth: Payer: Self-pay

## 2018-05-13 NOTE — Telephone Encounter (Signed)
Remote ICM transmission received.  Attempted call to patient regarding ICM remote transmission and left detailed message, per DPR, with next ICM remote transmission date of 06/15/2018.  Advised to return call for any fluid symptoms or questions.

## 2018-05-18 MED FILL — ELIQUIS 5 MG TABLET: 5 | 30 days supply | Qty: 60 | Fill #0

## 2018-05-18 MED FILL — ENTRESTO 24 MG-26 MG TABLET: 24-26 | 30 days supply | Qty: 60 | Fill #0

## 2018-05-22 MED FILL — BISOPROLOL FUMARATE 5 MG TA: 5 | 30 days supply | Qty: 30 | Fill #1

## 2018-06-11 MED FILL — DIGOXIN 0.125 MG TABLET: 125 | 30 days supply | Qty: 30 | Fill #1

## 2018-06-15 ENCOUNTER — Ambulatory Visit (INDEPENDENT_AMBULATORY_CARE_PROVIDER_SITE_OTHER): Payer: Medicare Other

## 2018-06-15 ENCOUNTER — Other Ambulatory Visit: Payer: Self-pay

## 2018-06-15 DIAGNOSIS — Z95810 Presence of automatic (implantable) cardiac defibrillator: Secondary | ICD-10-CM | POA: Diagnosis not present

## 2018-06-15 DIAGNOSIS — I5022 Chronic systolic (congestive) heart failure: Secondary | ICD-10-CM

## 2018-06-16 NOTE — Progress Notes (Signed)
EPIC Encounter for ICM Monitoring  Patient Name: Philip Chavez is a 68 y.o. male Date: 06/16/2018 Primary Care Physican: Wilber Oliphant, MD Primary Cardiologist:[place] Electrophysiologist: Lovena Le BiV Pacing:98.4% Last Weight:209lbs  06/16/2018 Weight: unknown  Clinical Status (16-Apr-2018 to 11-May-2018)  VT-NS (>4 beats, >162 bpm)    3  Time in AT/AF  <0.1 hr/day (<0.1%)                                                          Transmission reviewed.   Spoke with patient and he is doing well.  Denies any fluid symptoms.   OptiVol Thoracic impedance normal.  Prescribed dosage:Furosemide 20 mg 1 tablet twice a day.  Labs: 09/29/2017 Creatinine1.35, BUN14, Potassium3.9, Sodium141, EGFR>60 05/23/2017 Creatinine1.22, BUN12, Potassium3.8, Sodium137, EGFR>60 A complete set of results can be found in Results Review.  Recommendations: Encouraged to call if experiencing any fluid symptoms.  Follow-up plan: ICM clinic phone appointment on6/15/2020. Visit with Dr Aundra Dubin on 07/03/2018.  Copy of ICM check sent to Dr.Taylor.   3 month ICM trend: 06/15/2018    1 Year ICM trend:       Rosalene Billings, RN 06/16/2018 10:06 AM

## 2018-06-17 ENCOUNTER — Other Ambulatory Visit (HOSPITAL_COMMUNITY): Payer: Self-pay | Admitting: Cardiology

## 2018-06-17 MED FILL — ENTRESTO 24 MG-26 MG TABLET: 24-26 | 30 days supply | Qty: 60 | Fill #0

## 2018-06-17 MED FILL — ELIQUIS 5 MG TABLET: 5 | 30 days supply | Qty: 60 | Fill #1

## 2018-06-18 ENCOUNTER — Other Ambulatory Visit (HOSPITAL_COMMUNITY): Payer: Self-pay

## 2018-06-18 MED ORDER — SACUBITRIL-VALSARTAN 24-26 MG PO TABS: 1.0000 | ORAL_TABLET | Freq: Two times a day (BID) | ORAL | 3 refills | Status: DC

## 2018-06-20 MED FILL — BISOPROLOL FUMARATE 5 MG TA: 5 | 30 days supply | Qty: 30 | Fill #2

## 2018-06-27 ENCOUNTER — Other Ambulatory Visit: Payer: Self-pay | Admitting: Cardiology

## 2018-07-03 ENCOUNTER — Encounter (HOSPITAL_COMMUNITY): Payer: Medicare Other | Admitting: Cardiology

## 2018-07-06 MED FILL — DIGOXIN 0.125 MG TABLET: 125 | 30 days supply | Qty: 30 | Fill #2

## 2018-07-07 ENCOUNTER — Other Ambulatory Visit (HOSPITAL_COMMUNITY): Payer: Self-pay | Admitting: Internal Medicine

## 2018-07-07 DIAGNOSIS — I5022 Chronic systolic (congestive) heart failure: Secondary | ICD-10-CM

## 2018-07-19 MED FILL — BISOPROLOL FUMARATE 5 MG TA: 5 | 30 days supply | Qty: 30 | Fill #3

## 2018-07-19 MED FILL — ELIQUIS 5 MG TABLET: 5 | 30 days supply | Qty: 60 | Fill #2

## 2018-07-20 ENCOUNTER — Ambulatory Visit (INDEPENDENT_AMBULATORY_CARE_PROVIDER_SITE_OTHER): Payer: Medicare Other

## 2018-07-20 DIAGNOSIS — I5022 Chronic systolic (congestive) heart failure: Secondary | ICD-10-CM | POA: Diagnosis not present

## 2018-07-20 DIAGNOSIS — Z95810 Presence of automatic (implantable) cardiac defibrillator: Secondary | ICD-10-CM | POA: Diagnosis not present

## 2018-07-21 NOTE — Progress Notes (Signed)
EPIC Encounter for ICM Monitoring  Patient Name: Philip Chavez is a 68 y.o. male Date: 07/21/2018 Primary Care Physican: Wilber Oliphant, MD Primary [place] Electrophysiologist: Lovena Le BiV Pacing:99% Last Weight:209lbs     Transmission reviewed.     OptiVol Thoracic impedancenormal.  Prescribed dosage:Furosemide 20 mg 1 tablet twice a day.  Labs: 02/02/2018 Creatinine 1.22, BUN 15, Potassium 4.0, Sodium 137, GFR >60 09/29/2017 Creatinine1.35, BUN14, Potassium3.9, Sodium141, GFR>60 05/23/2017 Creatinine1.22, BUN12, Potassium3.8, Sodium137, GFR>60 A complete set of results can be found in Results Review.  Recommendations:None  Follow-up plan: ICM clinic phone appointment on7/20/2020.   Copy of ICM check sent to Dr.Taylor.  3 month ICM trend: 07/21/2018    1 Year ICM trend:       Rosalene Billings, RN 07/21/2018 9:23 AM

## 2018-07-22 MED FILL — ENTRESTO 24 MG-26 MG TABLET: 24-26 | 30 days supply | Qty: 60 | Fill #1

## 2018-08-04 MED FILL — DIGOXIN 0.125 MG TABLET: 125 | 30 days supply | Qty: 30 | Fill #3

## 2018-08-11 MED FILL — SPIRONOLACTONE 25 MG TABS: 25 | 90 days supply | Qty: 90 | Fill #1

## 2018-08-15 MED FILL — hydrALAZINE HCL 25 MG TABS: 25 | 90 days supply | Qty: 135 | Fill #1

## 2018-08-19 ENCOUNTER — Ambulatory Visit (INDEPENDENT_AMBULATORY_CARE_PROVIDER_SITE_OTHER): Payer: Medicare Other | Admitting: *Deleted

## 2018-08-19 DIAGNOSIS — I428 Other cardiomyopathies: Secondary | ICD-10-CM

## 2018-08-19 LAB — CUP PACEART REMOTE DEVICE CHECK
Battery Remaining Longevity: 32 mo
Battery Voltage: 2.95 V
Brady Statistic AP VP Percent: 8.88 %
Brady Statistic AP VS Percent: 0.01 %
Brady Statistic AS VP Percent: 90.59 %
Brady Statistic AS VS Percent: 0.52 %
Brady Statistic RA Percent Paced: 8.56 %
Brady Statistic RV Percent Paced: 99.05 %
Date Time Interrogation Session: 20200715082823
HighPow Impedance: 57 Ohm
Implantable Lead Implant Date: 20150209
Implantable Lead Implant Date: 20160919
Implantable Lead Implant Date: 20160919
Implantable Lead Location: 753858
Implantable Lead Location: 753859
Implantable Lead Location: 753860
Implantable Lead Model: 4598
Implantable Lead Model: 5076
Implantable Lead Model: 6935
Implantable Pulse Generator Implant Date: 20160919
Lead Channel Impedance Value: 266 Ohm
Lead Channel Impedance Value: 304 Ohm
Lead Channel Impedance Value: 304 Ohm
Lead Channel Impedance Value: 323 Ohm
Lead Channel Impedance Value: 323 Ohm
Lead Channel Impedance Value: 342 Ohm
Lead Channel Impedance Value: 380 Ohm
Lead Channel Impedance Value: 399 Ohm
Lead Channel Impedance Value: 437 Ohm
Lead Channel Impedance Value: 456 Ohm
Lead Channel Impedance Value: 494 Ohm
Lead Channel Impedance Value: 513 Ohm
Lead Channel Impedance Value: 513 Ohm
Lead Channel Pacing Threshold Amplitude: 0.875 V
Lead Channel Pacing Threshold Amplitude: 0.875 V
Lead Channel Pacing Threshold Amplitude: 2.5 V
Lead Channel Pacing Threshold Pulse Width: 0.4 ms
Lead Channel Pacing Threshold Pulse Width: 0.4 ms
Lead Channel Pacing Threshold Pulse Width: 0.4 ms
Lead Channel Sensing Intrinsic Amplitude: 3.125 mV
Lead Channel Sensing Intrinsic Amplitude: 3.125 mV
Lead Channel Sensing Intrinsic Amplitude: 6 mV
Lead Channel Sensing Intrinsic Amplitude: 6 mV
Lead Channel Setting Pacing Amplitude: 1.75 V
Lead Channel Setting Pacing Amplitude: 2 V
Lead Channel Setting Pacing Pulse Width: 0.4 ms
Lead Channel Setting Sensing Sensitivity: 0.3 mV

## 2018-08-19 MED FILL — ELIQUIS 5 MG TABLET: 5 | 30 days supply | Qty: 60 | Fill #3

## 2018-08-19 MED FILL — ENTRESTO 24 MG-26 MG TABLET: 24-26 | 30 days supply | Qty: 60 | Fill #2

## 2018-08-20 ENCOUNTER — Other Ambulatory Visit (HOSPITAL_COMMUNITY): Payer: Self-pay | Admitting: Cardiology

## 2018-08-20 MED FILL — BISOPROLOL FUMARATE 5 MG TA: 5 | 30 days supply | Qty: 30 | Fill #0

## 2018-08-24 ENCOUNTER — Ambulatory Visit (INDEPENDENT_AMBULATORY_CARE_PROVIDER_SITE_OTHER): Payer: Medicare Other

## 2018-08-24 DIAGNOSIS — I5022 Chronic systolic (congestive) heart failure: Secondary | ICD-10-CM | POA: Diagnosis not present

## 2018-08-24 DIAGNOSIS — Z95810 Presence of automatic (implantable) cardiac defibrillator: Secondary | ICD-10-CM | POA: Diagnosis not present

## 2018-08-25 NOTE — Progress Notes (Signed)
EPIC Encounter for ICM Monitoring  Patient Name: Philip Chavez is a 68 y.o. male Date: 08/25/2018 Primary Care Physican: Wilber Oliphant, MD Primary [place] Electrophysiologist: Lovena Le BiV Pacing:99.1% Last Weight:209lbs     Spoke with patient and he said he is doing well.  No complaints  OptiVolThoracic impedancenormal.  Prescribed dosage:Furosemide 20 mg 1 tablet twice a day.  Labs: 02/02/2018 Creatinine 1.22, BUN 15, Potassium 4.0, Sodium 137, GFR >60 09/29/2017 Creatinine1.35, BUN14, Potassium3.9, Sodium141, GFR>60 05/23/2017 Creatinine1.22, BUN12, Potassium3.8, Sodium137, GFR>60 A complete set of results can be found in Results Review.  Recommendations:No changes and encouraged to call if experiencing any fluid symptoms.  Follow-up plan: ICM clinic phone appointment on8/24/2020.   Copy of ICM check sent to Dr.Taylor.   3 month ICM trend: 08/24/2018    1 Year ICM trend:       Rosalene Billings, RN 08/25/2018 10:18 AM

## 2018-08-28 ENCOUNTER — Encounter: Payer: Self-pay | Admitting: Cardiology

## 2018-08-28 NOTE — Progress Notes (Signed)
Remote ICD transmission.   

## 2018-09-03 ENCOUNTER — Other Ambulatory Visit (HOSPITAL_COMMUNITY): Payer: Self-pay

## 2018-09-03 ENCOUNTER — Other Ambulatory Visit (HOSPITAL_COMMUNITY): Payer: Self-pay | Admitting: Internal Medicine

## 2018-09-03 MED ORDER — DIGOXIN 125 MCG PO TABS: ORAL_TABLET | ORAL | 0 refills | Status: DC

## 2018-09-03 MED FILL — DIGOXIN 0.125 MG TABLET: 125 | 30 days supply | Qty: 30 | Fill #0

## 2018-09-18 ENCOUNTER — Other Ambulatory Visit (HOSPITAL_COMMUNITY): Payer: Self-pay

## 2018-09-18 MED ORDER — ENTRESTO 24-26 MG PO TABS: 1.0000 | ORAL_TABLET | Freq: Two times a day (BID) | ORAL | 3 refills | Status: DC

## 2018-09-18 MED FILL — ENTRESTO 24 MG-26 MG TABLET: 24-26 | 30 days supply | Qty: 60 | Fill #0

## 2018-09-18 MED FILL — ELIQUIS 5 MG TABLET: 5 | 30 days supply | Qty: 60 | Fill #4

## 2018-09-19 MED FILL — BISOPROLOL FUMARATE 5 MG TA: 5 | 30 days supply | Qty: 30 | Fill #1

## 2018-09-28 ENCOUNTER — Ambulatory Visit (HOSPITAL_COMMUNITY)
Admission: RE | Admit: 2018-09-28 | Discharge: 2018-09-28 | Disposition: A | Discharge status: Home / Self Care | Payer: Medicare Other | Source: Ambulatory Visit | Attending: Cardiology | Admitting: Cardiology

## 2018-09-28 ENCOUNTER — Other Ambulatory Visit: Payer: Self-pay

## 2018-09-28 ENCOUNTER — Ambulatory Visit (INDEPENDENT_AMBULATORY_CARE_PROVIDER_SITE_OTHER): Payer: Medicare Other

## 2018-09-28 VITALS — HR 78 | Wt 216.0 lb

## 2018-09-28 DIAGNOSIS — I2510 Atherosclerotic heart disease of native coronary artery without angina pectoris: Secondary | ICD-10-CM | POA: Diagnosis not present

## 2018-09-28 DIAGNOSIS — I480 Paroxysmal atrial fibrillation: Secondary | ICD-10-CM

## 2018-09-28 DIAGNOSIS — Z7901 Long term (current) use of anticoagulants: Secondary | ICD-10-CM | POA: Insufficient documentation

## 2018-09-28 DIAGNOSIS — Z86711 Personal history of pulmonary embolism: Secondary | ICD-10-CM | POA: Insufficient documentation

## 2018-09-28 DIAGNOSIS — Z79899 Other long term (current) drug therapy: Secondary | ICD-10-CM | POA: Insufficient documentation

## 2018-09-28 DIAGNOSIS — I4892 Unspecified atrial flutter: Secondary | ICD-10-CM | POA: Insufficient documentation

## 2018-09-28 DIAGNOSIS — N189 Chronic kidney disease, unspecified: Secondary | ICD-10-CM | POA: Insufficient documentation

## 2018-09-28 DIAGNOSIS — G4733 Obstructive sleep apnea (adult) (pediatric): Secondary | ICD-10-CM | POA: Insufficient documentation

## 2018-09-28 DIAGNOSIS — Z950 Presence of cardiac pacemaker: Secondary | ICD-10-CM | POA: Diagnosis not present

## 2018-09-28 DIAGNOSIS — I5042 Chronic combined systolic (congestive) and diastolic (congestive) heart failure: Secondary | ICD-10-CM | POA: Diagnosis not present

## 2018-09-28 DIAGNOSIS — I5022 Chronic systolic (congestive) heart failure: Secondary | ICD-10-CM | POA: Insufficient documentation

## 2018-09-28 DIAGNOSIS — I428 Other cardiomyopathies: Secondary | ICD-10-CM | POA: Insufficient documentation

## 2018-09-28 DIAGNOSIS — Z95810 Presence of automatic (implantable) cardiac defibrillator: Secondary | ICD-10-CM | POA: Diagnosis not present

## 2018-09-28 DIAGNOSIS — Z8249 Family history of ischemic heart disease and other diseases of the circulatory system: Secondary | ICD-10-CM | POA: Diagnosis not present

## 2018-09-28 DIAGNOSIS — E785 Hyperlipidemia, unspecified: Secondary | ICD-10-CM | POA: Insufficient documentation

## 2018-09-28 DIAGNOSIS — I442 Atrioventricular block, complete: Secondary | ICD-10-CM | POA: Diagnosis not present

## 2018-09-28 DIAGNOSIS — Z86718 Personal history of other venous thrombosis and embolism: Secondary | ICD-10-CM | POA: Insufficient documentation

## 2018-09-28 LAB — CBC
HCT: 44.5 % (ref 39.0–52.0)
Hemoglobin: 15 g/dL (ref 13.0–17.0)
MCH: 32.2 pg (ref 26.0–34.0)
MCHC: 33.7 g/dL (ref 30.0–36.0)
MCV: 95.5 fL (ref 80.0–100.0)
Platelets: 155 10*3/uL (ref 150–400)
RBC: 4.66 MIL/uL (ref 4.22–5.81)
RDW: 13.5 % (ref 11.5–15.5)
WBC: 5.6 10*3/uL (ref 4.0–10.5)
nRBC: 0 % (ref 0.0–0.2)

## 2018-09-28 LAB — BASIC METABOLIC PANEL
Anion gap: 11 (ref 5–15)
BUN: 11 mg/dL (ref 8–23)
CO2: 23 mmol/L (ref 22–32)
Calcium: 9.2 mg/dL (ref 8.9–10.3)
Chloride: 105 mmol/L (ref 98–111)
Creatinine, Ser: 1.22 mg/dL (ref 0.61–1.24)
GFR calc Af Amer: >60 mL/min (ref >60)
GFR calc non Af Amer: >60 mL/min (ref >60)
Glucose, Bld: 126 mg/dL — ABNORMAL HIGH (ref 70–99)
Potassium: 4.2 mmol/L (ref 3.5–5.1)
Sodium: 139 mmol/L (ref 135–145)

## 2018-09-28 LAB — DIGOXIN LEVEL: Digoxin Level: 0.7 ng/mL — ABNORMAL LOW (ref 0.8–2.0)

## 2018-09-28 MED FILL — DIGOXIN 0.125 MG TABLET: 125 | 30 days supply | Qty: 30 | Fill #1

## 2018-09-28 NOTE — Patient Instructions (Signed)
RESTART IMDUR 30 mg (1 tab) daily  Labs today We will only contact you if something comes back abnormal or we need to make some changes. Otherwise no news is good news!  Your physician has requested that you have an echocardiogram. Echocardiography is a painless test that uses sound waves to create images of your heart. It provides your doctor with information about the size and shape of your heart and how well your heart's chambers and valves are working. This procedure takes approximately one hour. There are no restrictions for this procedure.  You should have this done before your next follow up appointment.   Your physician recommends that you schedule a follow-up appointment in: 4 months with Dr Aundra Dubin.   At the [facility], you and your health needs are our priority. As part of our continuing mission to provide you with exceptional heart care, we have created designated Provider Care Teams. These Care Teams include your primary Cardiologist (physician) and Advanced Practice Providers (APPs- Physician Assistants and Nurse Practitioners) who all work together to provide you with the care you need, when you need it.   You may see any of the following providers on your designated Care Team at your next follow up: Marland Kitchen Dr Glori Bickers . Dr Loralie Champagne . Darrick Grinder, NP   Please be sure to bring in all your medications bottles to every appointment.

## 2018-09-28 NOTE — Progress Notes (Signed)
Patient ID: Philip Chavez, male   DOB: March 03, 1950, 68 y.o.   MRN: [id number] PCP: Dr. Gerlean Ren EP: Dr. Lovena Le HF cardiologist: Dr Aundra Dubin  68 y.o. with history of complete heart block/Medtronic PPM, cardiomyopathy, and atrial flutter s/p ablation presents for CHF clinic evaluation.  Patient developed complete heart block in 2/15 and had PPM placed at that time.  He paces his RV continuously.  In 2/15, EF was 50-55% by echo.  By 6/15, EF had fallen to 20-25%.  Cardiolite showed possible scar but no ischemia.  On 09/03/14, he was admitted with dyspnea and found to be in atrial flutter with RLL PNA and with volume overload.  Echo showed EF 15% with diffuse hypokinesis.  He was started on IV Lasix and IV amiodarone. He converted back to NSR.  He subsequently had atrial flutter ablation.  He had right and left heart cath in 9/16, showing nonobstructive CAD and optimized filling pressures with CI 2.09.  He subsequently had upgrade to CRT by Dr Lovena Le.  EF up to 30-35% in 4/17.  Subsequently, the LV lead dislodged.  He was offered lead revision, but preferred watchful waiting. Echo in 5/18 showed EF down to 20%.    [name] returns for followup of CHF today.  He continues to have minimal symptoms.  BP 95/56, denies lightheadedness/syncope. No significant exertional dyspnea.  No chest pain. No orthopnea/PND.  He wants to know if he can stop his medications because he feels good.   Labs (8/16): K 3.9, creatinine 1.03, AST 106, ALT 173, HCT 45.1, LDL 132 Labs (9/16): K 3.9, creatinine 1.16, HCT 42.5 Labs (10/16): K 3.9, creatinine 1.32, HCT 39.9 Labs (2/17): K 4.1, creatinine 1.26, BNP 107, digoxin 0.3 Labs (07/10/2015): K 3.9 Creatinine 1.23  Labs (1/18): K 4.1, creatinine 1.18, digoxin 0.3 Labs (2/18): K 4, creatinine 1.24, digoxin 0.4 Labs (5/18): K 4, creatinine 1.25, hgb 14.8, digoxin 0.5 Labs (10/18): K 4, creatinine 1.24, digoxin 0.2 Labs (4/19): K 3.8, creatinine 1.22, digoxin 0.5, LDL 69, HDL 40 Labs  (8/19): K 3.9, creatinine 1.35, digoxin 0.3 Labs (12/19): K 4, creatinine 1.22  ECG (personally reviewed): NSR, v-paced  PMH: 1. H/o complete heart block: Has Medtronic PPM, placed in 2/15.   2. Cardiomyopathy: Echo (2/15) prior to PPM placement with EF 50-55%.  Echo (6/15) with EF 20-25%,  Cardiolite at that time showed possible scar but no ischemia.  Echo (8/16) with EF 15%, diffuse hypokinesis, mildly dilated RV with normal systolic function, PA systolic pressure 69 mmHg.  LHC/RHC (9/16) with mean RA 4, PA 31/15 mean 20, mean PCWP 8, CI 2.09, 40% mLAD stenosis.  Upgrade to MDT CRT-D in 9/16.  - Echo (4/17) with EF 30-35%.   - LV lead dislodged and loss of BiV pacing.  - Echo (5/18) with EF 20%, mild LV dilation, normal RV size with mildly decreased systolic function, mild AI.  3. CKD 4. 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  5. Atrial tachycardia: paroxysmal. 6. Atrial flutter: s/p ablation in 8/16.  7. OSA 8. Hyperlipidemia 9. Shingles with post-herpetic neuralgia.  10. Atrial fibrillation: Paroxysmal.   FH: Father with CHF, complete heart block with PPM.  Sister with complete heart block and PPM.   SH: Retired Curator, nonsmoker, lives in [place]: All systems reviewed and negative except as per HPI  Current Outpatient Medications  Medication Sig Dispense Refill  . acetaminophen (TYLENOL) 325 MG tablet Take 1-2 tablets (325-650 mg total) by mouth every 4 (four)  hours as needed for mild pain.    . bisoprolol (ZEBETA) 5 MG tablet TAKE 1 TABLET BY MOUTH DAILY. 30 tablet 1  . digoxin (LANOXIN) 0.125 MG tablet TAKE 1 TABLET BY MOUTH DAILY. NEED OFFICE VISIT 30 tablet 0  . ELIQUIS 5 MG TABS tablet TAKE 1 TABLET BY MOUTH 2 TIMES DAILY. 60 tablet 6  . furosemide (LASIX) 20 MG tablet TAKE 1 TABLET (20 MG TOTAL) 2 (TWO) TIMES DAILY BY MOUTH. 60 tablet 5  . hydrALAZINE (APRESOLINE) 25 MG tablet TAKE 1/2 TABLET BY MOUTH 3 TIMES DAILY. 135 tablet 3  . sacubitril-valsartan (ENTRESTO)  24-26 MG Take 1 tablet by mouth 2 (two) times daily. 60 tablet 3  . simvastatin (ZOCOR) 40 MG tablet TAKE 1 TABLET BY MOUTH EVERY DAY 90 tablet 3  . spironolactone (ALDACTONE) 25 MG tablet TAKE 1 TABLET BY MOUTH DAILY. 90 tablet 3  . isosorbide mononitrate (IMDUR) 30 MG 24 hr tablet Take 1 tablet (30 mg total) by mouth daily. 30 tablet 3   No current facility-administered medications for this encounter.    Pulse 78   Wt 98 kg (216 lb)   SpO2 99%   BMI 28.50 kg/m  General: NAD Neck: No JVD, no thyromegaly or thyroid nodule.  Lungs: Clear to auscultation bilaterally with normal respiratory effort. CV: Nondisplaced PMI.  Heart regular S1/S2, no S3/S4, no murmur.  No peripheral edema.  No carotid bruit.  Normal pedal pulses.  Abdomen: Soft, nontender, no hepatosplenomegaly, no distention.  Skin: Intact without lesions or rashes.  Neurologic: Alert and oriented x 3.  Psych: Normal affect. Extremities: No clubbing or cyanosis.  HEENT: Normal.   Assessment/Plan: 1. Chronic systolic CHF: Nonischemic cardiomyopathy.  EF 15% on echo in 8/16.  Patient had borderline reduced EF when PPM was placed in 2/15.  After that, EF fell significantly.  He has a history of complete heart block and father had CHF and CHB with pacemaker, and sounds like sister also had CHB with pacemaker. 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  He had CRT upgrade.  No significant CAD on last cath.  Echo in 4/17 showed improvement in EF to 30-35%.  However, his LV lead has dislodged and he is no longer BiV pacing, now RV pacing > 99% of the time.  Echo in 5/18 showed EF down to 20%.  Symptomatically, he is still doing very well (NYHA class I-II) with no volume overload on exam.  SBP is too soft to titrate up meds today, but I do not want him to stop any of his medication regimen.  - Continue bisoprolol 5 mg daily.  - Continue  Entresto 24/26 bid, spironolactone 25 daily, Lasix 20 bid,  hydralazine 12.5 mg tid and Imdur 30 daily.  BMET today.   - Continue digoxin. Get digoxin level today.  - Echo in 5/18 showed EF reduced to 20%, possibly due to loss of BiV pacing.  He has minimal symptoms and has wanted to hold off on replacement of LV lead => if he worsens symptomatically, would strongly recommend replacement of LF lead.   - I will arrange for repeat echo.  - We have discussed genetic evaluation to look for LMNA or SCN5A mutations that could be used for familial screening, he has wanted to hold off.  2. Atrial flutter: s/p ablation.  3. Atrial fibrillation: Currently in NSR.  - Continue Eliquis, CBC today. 3. PE/DVT: >1 year ago and associated with surgery.  4. Hyperlipidemia: He has mild CAD.   - No ASA with Eliquis.   - Continue simvastatin 40 mg daily, good lipids when last checked.   5. Complete heart block: Now RV pacing with loss of LV lead.   Followup in 4 months.   Loralie Champagne 09/28/2018

## 2018-09-29 NOTE — Progress Notes (Signed)
EPIC Encounter for ICM Monitoring  Patient Name: Philip Chavez is a 68 y.o. male Date: 09/29/2018 Primary Care Physican: Wilber Oliphant, MD Primary Cardiologist:[place] Electrophysiologist: Lovena Le BiV Pacing:99.1% Last Weight:209lbs   Clinical Status (24-Aug-2018 to 28-Sep-2018)  AT/AF  1 episode  Time in AT/AF   0.2 hr/day (0.7%)  Longest AT/AF   6 hours   Transmission reviewed.  [place] with Dr Aundra Dubin 8/24 and per note his LV lead has dislodged and he is no longer BiV pacing, now RV pacing > 99% of the time  OptiVolThoracic impedancenormal.  Prescribed dosage:Furosemide 20 mg 1 tablet twice a day.  Labs: 09/28/2018 Creatinine 1.22, BUN 11, Potassium 4.2, Sodium 139, GFR >60 A complete set of results can be found in Results Review.  Recommendations:None  Follow-up plan: ICM clinic phone appointment on10/15/2020.   Copy of ICM check sent to Dr.Taylor.   3 month ICM trend: 09/28/2018    1 Year ICM trend:       Rosalene Billings, RN 09/29/2018 8:47 AM

## 2018-10-19 ENCOUNTER — Other Ambulatory Visit (HOSPITAL_COMMUNITY): Payer: Self-pay | Admitting: Cardiology

## 2018-10-19 MED FILL — ENTRESTO 24 MG-26 MG TABLET: 24-26 | 30 days supply | Qty: 60 | Fill #1

## 2018-10-20 MED FILL — ELIQUIS 5 MG TABLET: 5 | 30 days supply | Qty: 60 | Fill #0

## 2018-10-20 MED FILL — BISOPROLOL FUMARATE 5 MG TA: 5 | 30 days supply | Qty: 30 | Fill #0

## 2018-10-27 MED FILL — DIGOXIN 0.125 MG TABLET: 125 | 30 days supply | Qty: 30 | Fill #2

## 2018-11-08 MED FILL — SPIRONOLACTONE 25 MG TABS: 25 | 90 days supply | Qty: 90 | Fill #2

## 2018-11-16 MED FILL — hydrALAZINE HCL 25 MG TABS: 25 | 90 days supply | Qty: 135 | Fill #2

## 2018-11-17 MED FILL — ELIQUIS 5 MG TABLET: 5 | 30 days supply | Qty: 60 | Fill #1

## 2018-11-17 MED FILL — ENTRESTO 24 MG-26 MG TABLET: 24-26 | 30 days supply | Qty: 60 | Fill #2

## 2018-11-18 ENCOUNTER — Ambulatory Visit (INDEPENDENT_AMBULATORY_CARE_PROVIDER_SITE_OTHER): Payer: Medicare Other | Admitting: *Deleted

## 2018-11-18 DIAGNOSIS — I5022 Chronic systolic (congestive) heart failure: Secondary | ICD-10-CM | POA: Diagnosis not present

## 2018-11-18 DIAGNOSIS — I442 Atrioventricular block, complete: Secondary | ICD-10-CM

## 2018-11-18 LAB — CUP PACEART REMOTE DEVICE CHECK
Battery Remaining Longevity: 28 mo
Battery Voltage: 2.95 V
Brady Statistic AP VP Percent: 6.47 %
Brady Statistic AP VS Percent: 0 %
Brady Statistic AS VP Percent: 92.87 %
Brady Statistic AS VS Percent: 0.65 %
Brady Statistic RA Percent Paced: 6.31 %
Brady Statistic RV Percent Paced: 98.76 %
Date Time Interrogation Session: 20201014083824
HighPow Impedance: 61 Ohm
Implantable Lead Implant Date: 20150209
Implantable Lead Implant Date: 20160919
Implantable Lead Implant Date: 20160919
Implantable Lead Location: 753858
Implantable Lead Location: 753859
Implantable Lead Location: 753860
Implantable Lead Model: 4598
Implantable Lead Model: 5076
Implantable Lead Model: 6935
Implantable Pulse Generator Implant Date: 20160919
Lead Channel Impedance Value: 266 Ohm
Lead Channel Impedance Value: 266 Ohm
Lead Channel Impedance Value: 304 Ohm
Lead Channel Impedance Value: 323 Ohm
Lead Channel Impedance Value: 323 Ohm
Lead Channel Impedance Value: 323 Ohm
Lead Channel Impedance Value: 380 Ohm
Lead Channel Impedance Value: 399 Ohm
Lead Channel Impedance Value: 437 Ohm
Lead Channel Impedance Value: 437 Ohm
Lead Channel Impedance Value: 456 Ohm
Lead Channel Impedance Value: 513 Ohm
Lead Channel Impedance Value: 513 Ohm
Lead Channel Pacing Threshold Amplitude: 0.875 V
Lead Channel Pacing Threshold Amplitude: 1 V
Lead Channel Pacing Threshold Amplitude: 2.5 V
Lead Channel Pacing Threshold Pulse Width: 0.4 ms
Lead Channel Pacing Threshold Pulse Width: 0.4 ms
Lead Channel Pacing Threshold Pulse Width: 0.4 ms
Lead Channel Sensing Intrinsic Amplitude: 11.875 mV
Lead Channel Sensing Intrinsic Amplitude: 11.875 mV
Lead Channel Sensing Intrinsic Amplitude: 3.375 mV
Lead Channel Sensing Intrinsic Amplitude: 3.375 mV
Lead Channel Setting Pacing Amplitude: 2 V
Lead Channel Setting Pacing Amplitude: 2 V
Lead Channel Setting Pacing Pulse Width: 0.4 ms
Lead Channel Setting Sensing Sensitivity: 0.3 mV

## 2018-11-18 MED FILL — BISOPROLOL FUMARATE 5 MG TA: 5 | 30 days supply | Qty: 30 | Fill #1

## 2018-11-20 ENCOUNTER — Ambulatory Visit (INDEPENDENT_AMBULATORY_CARE_PROVIDER_SITE_OTHER): Payer: Medicare Other

## 2018-11-20 DIAGNOSIS — Z95810 Presence of automatic (implantable) cardiac defibrillator: Secondary | ICD-10-CM | POA: Diagnosis not present

## 2018-11-20 DIAGNOSIS — I5042 Chronic combined systolic (congestive) and diastolic (congestive) heart failure: Secondary | ICD-10-CM

## 2018-11-23 ENCOUNTER — Telehealth: Payer: Self-pay

## 2018-11-23 NOTE — Progress Notes (Signed)
EPIC Encounter for ICM Monitoring  Patient Name: Philip Chavez is a 68 y.o. male Date: 11/23/2018 Primary Care Physican: Wilber Oliphant, MD Primary [place] Electrophysiologist: Lovena Le RV Pacing:98.8% 09/28/2018 Weight:216lbs (OV)   Attempted call to patient and unable to reach.  Transmission reviewed.     [place] with Dr Aundra Dubin 8/24 and per note his LV lead has dislodged and he is no longer BiV pacing.  Prescribed dosage:Furosemide 20 mg 1 tablet twice a day.  Labs: 09/28/2018 Creatinine 1.22, BUN 11, Potassium 4.2, Sodium 139, GFR >60 A complete set of results can be found in Results Review.  Recommendations: Unable to reach.    Follow-up plan: ICM clinic phone appointment on 12/21/2018.   91 day device clinic remote transmission 02/17/2019.  Office appt 02/01/2019 with Dr. Aundra Dubin.    Copy of ICM check sent to Dr. Lovena Le.   3 month ICM trend: 11/18/2018    1 Year ICM trend:       Rosalene Billings, RN 11/23/2018 10:50 AM

## 2018-11-23 NOTE — Progress Notes (Signed)
Returned patient call per voice mail request.  He states he is feeling fine and asymptomatic for fluid accumulation.  He denies any changes.  Transmission reviewed.  No changes and encouraged to call if experiencing any fluid symptoms.

## 2018-11-23 NOTE — Telephone Encounter (Signed)
Remote ICM transmission received.  Attempted call to patient regarding ICM remote transmission and no answer.  

## 2018-11-26 MED FILL — DIGOXIN 0.125 MG TABLET: 125 | 30 days supply | Qty: 30 | Fill #3

## 2018-11-30 ENCOUNTER — Other Ambulatory Visit: Payer: Self-pay

## 2018-11-30 ENCOUNTER — Ambulatory Visit (HOSPITAL_COMMUNITY)
Admission: RE | Admit: 2018-11-30 | Discharge: 2018-11-30 | Disposition: A | Discharge status: Home / Self Care | Payer: Medicare Other | Source: Ambulatory Visit | Attending: Cardiology | Admitting: Cardiology

## 2018-11-30 DIAGNOSIS — E785 Hyperlipidemia, unspecified: Secondary | ICD-10-CM | POA: Insufficient documentation

## 2018-11-30 DIAGNOSIS — I083 Combined rheumatic disorders of mitral, aortic and tricuspid valves: Secondary | ICD-10-CM | POA: Diagnosis not present

## 2018-11-30 DIAGNOSIS — I5022 Chronic systolic (congestive) heart failure: Secondary | ICD-10-CM | POA: Insufficient documentation

## 2018-11-30 DIAGNOSIS — I5042 Chronic combined systolic (congestive) and diastolic (congestive) heart failure: Secondary | ICD-10-CM | POA: Diagnosis not present

## 2018-11-30 DIAGNOSIS — I110 Hypertensive heart disease with heart failure: Secondary | ICD-10-CM | POA: Insufficient documentation

## 2018-11-30 DIAGNOSIS — Z950 Presence of cardiac pacemaker: Secondary | ICD-10-CM | POA: Insufficient documentation

## 2018-11-30 NOTE — Progress Notes (Signed)
Remote ICD transmission.   

## 2018-11-30 NOTE — Progress Notes (Signed)
  Echocardiogram 2D Echocardiogram has been performed.  Griff Badley G Othel Hoogendoorn 11/30/2018, 10:09 AM

## 2018-12-18 ENCOUNTER — Other Ambulatory Visit: Payer: Self-pay | Admitting: Cardiology

## 2018-12-18 MED FILL — ENTRESTO 24 MG-26 MG TABLET: 24-26 | 30 days supply | Qty: 60 | Fill #3

## 2018-12-18 MED FILL — ELIQUIS 5 MG TABLET: 5 | 30 days supply | Qty: 60 | Fill #2

## 2018-12-20 MED FILL — BISOPROLOL FUMARATE 5 MG TA: 5 | 30 days supply | Qty: 30 | Fill #2

## 2018-12-21 ENCOUNTER — Ambulatory Visit (INDEPENDENT_AMBULATORY_CARE_PROVIDER_SITE_OTHER): Payer: Medicare Other

## 2018-12-21 DIAGNOSIS — Z95810 Presence of automatic (implantable) cardiac defibrillator: Secondary | ICD-10-CM | POA: Diagnosis not present

## 2018-12-21 DIAGNOSIS — I5042 Chronic combined systolic (congestive) and diastolic (congestive) heart failure: Secondary | ICD-10-CM | POA: Diagnosis not present

## 2018-12-21 NOTE — Progress Notes (Signed)
EPIC Encounter for ICM Monitoring  Patient Name: Philip Chavez is a 68 y.o. male Date: 12/21/2018 Primary Care Physican: Wilber Oliphant, MD Primary Cardiologist:[place] Electrophysiologist: Lovena Le RV Pacing:98.8% 12/22/2018 Weight:206lbs   Clinical Status Since 18-Nov-2018  VT-NS (>4 beats, >162 bpm) 4 Episodes  AT/AF 2  Time in AT/AF 0.7 hr/day (2.9%)  Longest AT/AF 14 hours  Event Summary ?    AT/AF Daily Burden > Threshold ?    7 V. Sensing Episodes ?    4 VT-NS ?    23 hours in AT/AF Since Last Session   Spoke with patient and he is asymptomatic.  [place] with Dr Aundra Dubin 8/24 and per note his LV lead has dislodged and he is no longer BiV pacing.  Prescribed dosage:Furosemide 20 mg 1 tablet twice a day.  Labs: 09/28/2018 Creatinine 1.22, BUN 11, Potassium 4.2, Sodium 139, GFR >60 A complete set of results can be found in Results Review.  Recommendations: No changes and encouraged to call if experiencing any fluid symptoms.  Follow-up plan: ICM clinic phone appointment on 01/25/2019.   91 day device clinic remote transmission 02/17/2019.  Office appt 02/01/2019 with Dr.McLean.    Copy of ICM check sent to Dr. Lovena Le.   3 month ICM trend: 12/21/2018    1 Year ICM trend:       Rosalene Billings, RN 12/21/2018 5:05 PM

## 2018-12-26 MED FILL — DIGOXIN 0.125 MG TABLET: 125 | 30 days supply | Qty: 30 | Fill #0

## 2018-12-28 ENCOUNTER — Other Ambulatory Visit (HOSPITAL_COMMUNITY): Payer: Self-pay | Admitting: Internal Medicine

## 2019-01-18 MED FILL — BISOPROLOL FUMARATE 5 MG TA: 5 | 30 days supply | Qty: 30 | Fill #3

## 2019-01-18 MED FILL — ENTRESTO 24 MG-26 MG TABLET: 24-26 | 30 days supply | Qty: 60 | Fill #0

## 2019-01-18 MED FILL — ELIQUIS 5 MG TABLET: 5 | 30 days supply | Qty: 60 | Fill #3

## 2019-01-25 ENCOUNTER — Ambulatory Visit (INDEPENDENT_AMBULATORY_CARE_PROVIDER_SITE_OTHER): Payer: Medicare Other

## 2019-01-25 DIAGNOSIS — I5042 Chronic combined systolic (congestive) and diastolic (congestive) heart failure: Secondary | ICD-10-CM | POA: Diagnosis not present

## 2019-01-25 DIAGNOSIS — Z95810 Presence of automatic (implantable) cardiac defibrillator: Secondary | ICD-10-CM

## 2019-01-25 NOTE — Progress Notes (Signed)
EPIC Encounter for ICM Monitoring  Patient Name: Philip Chavez is a 68 y.o. male Date: 01/25/2019 Primary Care Physican: Wilber Oliphant, MD Primary Cardiologist:[place] Electrophysiologist: Lovena Le RV Pacing:98.6% 01/25/2019 Weight:205lbs   Clinical Status (21-Dec-2018 to 25-Jan-2019)   AT/AF            4 episodes   Time in AT/AF  0.4 hr/day (1.7%)  Longest AT/AF  11 hours   AT/AF >= 6 hr for 1 days.     Spoke with patient and he is asymptomatic.  He is without complaints.  [place] with Dr Aundra Dubin 8/24 and per note his LV lead has dislodged and he is no longer BiV pacing.  Prescribed dosage:Furosemide 20 mg 1 tablet twice a day.  Labs: 09/28/2018 Creatinine 1.22, BUN 11, Potassium 4.2, Sodium 139, GFR >60 A complete set of results can be found in Results Review.  Recommendations: No changes and encouraged to call if experiencing any fluid symptoms.  Follow-up plan: ICM clinic phone appointment on 03/01/2019.   91 day device clinic remote transmission 02/17/2019.  Office appt 02/01/2019 with Dr.McLean.    Copy of ICM check sent to Dr. Lovena Le.   3 month ICM trend: 01/25/2019    1 Year ICM trend:       Rosalene Billings, RN 01/25/2019 12:12 PM

## 2019-02-01 ENCOUNTER — Encounter (HOSPITAL_COMMUNITY): Payer: Medicare Other | Admitting: Cardiology

## 2019-02-06 MED FILL — DIGOXIN 0.125 MG TABLET: 125 | 30 days supply | Qty: 30 | Fill #0

## 2019-02-08 ENCOUNTER — Other Ambulatory Visit (HOSPITAL_COMMUNITY): Payer: Self-pay

## 2019-02-08 MED ORDER — DIGOXIN 125 MCG PO TABS: ORAL_TABLET | ORAL | 3 refills | Status: DC

## 2019-02-08 MED FILL — SPIRONOLACTONE 25 MG TABS: 25 | 90 days supply | Qty: 90 | Fill #3

## 2019-02-16 MED FILL — hydrALAZINE HCL 25 MG TABS: 25 | 90 days supply | Qty: 135 | Fill #3

## 2019-02-16 MED FILL — ELIQUIS 5 MG TABLET: 5 | 30 days supply | Qty: 60 | Fill #4

## 2019-02-16 MED FILL — BISOPROLOL FUMARATE 5 MG TA: 5 | 30 days supply | Qty: 30 | Fill #4

## 2019-02-16 MED FILL — ENTRESTO 24 MG-26 MG TABLET: 24-26 | 30 days supply | Qty: 60 | Fill #1

## 2019-02-17 ENCOUNTER — Ambulatory Visit (INDEPENDENT_AMBULATORY_CARE_PROVIDER_SITE_OTHER): Payer: Medicare PPO | Admitting: *Deleted

## 2019-02-17 DIAGNOSIS — I5022 Chronic systolic (congestive) heart failure: Secondary | ICD-10-CM | POA: Diagnosis not present

## 2019-02-18 LAB — CUP PACEART REMOTE DEVICE CHECK
Battery Remaining Longevity: 27 mo
Battery Voltage: 2.94 V
Brady Statistic AP VP Percent: 4.53 %
Brady Statistic AP VS Percent: 0 %
Brady Statistic AS VP Percent: 93.07 %
Brady Statistic AS VS Percent: 2.4 %
Brady Statistic RA Percent Paced: 4.44 %
Brady Statistic RV Percent Paced: 97.06 %
Date Time Interrogation Session: 20210114125051
HighPow Impedance: 54 Ohm
Implantable Lead Implant Date: 20150209
Implantable Lead Implant Date: 20160919
Implantable Lead Implant Date: 20160919
Implantable Lead Location: 753858
Implantable Lead Location: 753859
Implantable Lead Location: 753860
Implantable Lead Model: 4598
Implantable Lead Model: 5076
Implantable Lead Model: 6935
Implantable Pulse Generator Implant Date: 20160919
Lead Channel Impedance Value: 266 Ohm
Lead Channel Impedance Value: 266 Ohm
Lead Channel Impedance Value: 266 Ohm
Lead Channel Impedance Value: 304 Ohm
Lead Channel Impedance Value: 304 Ohm
Lead Channel Impedance Value: 323 Ohm
Lead Channel Impedance Value: 380 Ohm
Lead Channel Impedance Value: 399 Ohm
Lead Channel Impedance Value: 437 Ohm
Lead Channel Impedance Value: 437 Ohm
Lead Channel Impedance Value: 456 Ohm
Lead Channel Impedance Value: 456 Ohm
Lead Channel Impedance Value: 494 Ohm
Lead Channel Pacing Threshold Amplitude: 0.875 V
Lead Channel Pacing Threshold Amplitude: 1 V
Lead Channel Pacing Threshold Amplitude: 2.5 V
Lead Channel Pacing Threshold Pulse Width: 0.4 ms
Lead Channel Pacing Threshold Pulse Width: 0.4 ms
Lead Channel Pacing Threshold Pulse Width: 0.4 ms
Lead Channel Sensing Intrinsic Amplitude: 15.125 mV
Lead Channel Sensing Intrinsic Amplitude: 15.125 mV
Lead Channel Sensing Intrinsic Amplitude: 3.125 mV
Lead Channel Sensing Intrinsic Amplitude: 3.125 mV
Lead Channel Setting Pacing Amplitude: 2 V
Lead Channel Setting Pacing Amplitude: 2 V
Lead Channel Setting Pacing Pulse Width: 0.4 ms
Lead Channel Setting Sensing Sensitivity: 0.3 mV

## 2019-02-19 ENCOUNTER — Telehealth: Payer: Self-pay | Admitting: *Deleted

## 2019-02-19 NOTE — Telephone Encounter (Signed)
Patient returned call. Reports he was asleep and asymptomatic with episode. Reports compliance with cardiac medications. Advised that [facility], EP scheduler, will be contacting him to schedule visit. Pt requests appointment on M/W/F as his wife has dialysis on T/Th. No additional questions at this time.  ICD transmission was exported to Dr. Lovena Le for review on 02/18/19. Report available for review under "CV Procedures" tab.

## 2019-02-19 NOTE — Telephone Encounter (Signed)
Scheduled ICD transmission from 02/18/19 reveals VT episode on 02/03/19 around 02:19, initially under VT zone detection of 162bpm, falling in and out of detection until termination at 02:20. Total episode duration is unavailable, monitor zone is not enabled. Pt is overdue for f/u with Dr. Lovena Le.  LMOVM requesting call back to DC. Direct number provided.

## 2019-02-19 NOTE — Telephone Encounter (Signed)
The pt returning [place] phone call.

## 2019-02-22 ENCOUNTER — Encounter (HOSPITAL_COMMUNITY): Payer: Self-pay

## 2019-02-22 ENCOUNTER — Ambulatory Visit (HOSPITAL_COMMUNITY)
Admission: RE | Admit: 2019-02-22 | Discharge: 2019-02-22 | Disposition: A | Discharge status: Home / Self Care | Payer: Medicare PPO | Source: Ambulatory Visit | Attending: Cardiology | Admitting: Cardiology

## 2019-02-22 ENCOUNTER — Other Ambulatory Visit: Payer: Self-pay

## 2019-02-22 VITALS — BP 123/68 | HR 74 | Wt 213.6 lb

## 2019-02-22 DIAGNOSIS — I2510 Atherosclerotic heart disease of native coronary artery without angina pectoris: Secondary | ICD-10-CM | POA: Diagnosis not present

## 2019-02-22 DIAGNOSIS — I428 Other cardiomyopathies: Secondary | ICD-10-CM | POA: Diagnosis not present

## 2019-02-22 DIAGNOSIS — N189 Chronic kidney disease, unspecified: Secondary | ICD-10-CM | POA: Insufficient documentation

## 2019-02-22 DIAGNOSIS — Z7901 Long term (current) use of anticoagulants: Secondary | ICD-10-CM | POA: Diagnosis not present

## 2019-02-22 DIAGNOSIS — Z86711 Personal history of pulmonary embolism: Secondary | ICD-10-CM | POA: Diagnosis not present

## 2019-02-22 DIAGNOSIS — I442 Atrioventricular block, complete: Secondary | ICD-10-CM | POA: Diagnosis not present

## 2019-02-22 DIAGNOSIS — Z8249 Family history of ischemic heart disease and other diseases of the circulatory system: Secondary | ICD-10-CM | POA: Diagnosis not present

## 2019-02-22 DIAGNOSIS — I5022 Chronic systolic (congestive) heart failure: Secondary | ICD-10-CM | POA: Diagnosis not present

## 2019-02-22 DIAGNOSIS — Z950 Presence of cardiac pacemaker: Secondary | ICD-10-CM | POA: Insufficient documentation

## 2019-02-22 DIAGNOSIS — E785 Hyperlipidemia, unspecified: Secondary | ICD-10-CM | POA: Diagnosis not present

## 2019-02-22 DIAGNOSIS — G4733 Obstructive sleep apnea (adult) (pediatric): Secondary | ICD-10-CM | POA: Diagnosis not present

## 2019-02-22 DIAGNOSIS — Z86718 Personal history of other venous thrombosis and embolism: Secondary | ICD-10-CM | POA: Diagnosis not present

## 2019-02-22 DIAGNOSIS — I480 Paroxysmal atrial fibrillation: Secondary | ICD-10-CM | POA: Diagnosis not present

## 2019-02-22 DIAGNOSIS — Z79899 Other long term (current) drug therapy: Secondary | ICD-10-CM | POA: Diagnosis not present

## 2019-02-22 DIAGNOSIS — I5042 Chronic combined systolic (congestive) and diastolic (congestive) heart failure: Secondary | ICD-10-CM | POA: Diagnosis not present

## 2019-02-22 LAB — BASIC METABOLIC PANEL
Anion gap: 9 (ref 5–15)
BUN: 15 mg/dL (ref 8–23)
CO2: 25 mmol/L (ref 22–32)
Calcium: 9.2 mg/dL (ref 8.9–10.3)
Chloride: 103 mmol/L (ref 98–111)
Creatinine, Ser: 1.34 mg/dL — ABNORMAL HIGH (ref 0.61–1.24)
GFR calc Af Amer: >60 mL/min (ref >60)
GFR calc non Af Amer: 54 mL/min — ABNORMAL LOW (ref >60)
Glucose, Bld: 116 mg/dL — ABNORMAL HIGH (ref 70–99)
Potassium: 4.2 mmol/L (ref 3.5–5.1)
Sodium: 137 mmol/L (ref 135–145)

## 2019-02-22 LAB — CBC
HCT: 44.7 % (ref 39.0–52.0)
Hemoglobin: 14.9 g/dL (ref 13.0–17.0)
MCH: 32.1 pg (ref 26.0–34.0)
MCHC: 33.3 g/dL (ref 30.0–36.0)
MCV: 96.3 fL (ref 80.0–100.0)
Platelets: 158 10*3/uL (ref 150–400)
RBC: 4.64 MIL/uL (ref 4.22–5.81)
RDW: 13.4 % (ref 11.5–15.5)
WBC: 4.7 10*3/uL (ref 4.0–10.5)
nRBC: 0 % (ref 0.0–0.2)

## 2019-02-22 LAB — HEPATIC FUNCTION PANEL
ALT: 23 U/L (ref 0–44)
AST: 21 U/L (ref 15–41)
Albumin: 4 g/dL (ref 3.5–5.0)
Alkaline Phosphatase: 68 U/L (ref 38–126)
Bilirubin, Direct: 0.2 mg/dL (ref 0.0–0.2)
Indirect Bilirubin: 0.5 mg/dL (ref 0.3–0.9)
Total Bilirubin: 0.7 mg/dL (ref 0.3–1.2)
Total Protein: 7.1 g/dL (ref 6.5–8.1)

## 2019-02-22 LAB — LIPID PANEL
Cholesterol: 147 mg/dL (ref 0–200)
HDL: 44 mg/dL (ref >40)
LDL Cholesterol: 91 mg/dL (ref 0–99)
Total CHOL/HDL Ratio: 3.3 RATIO
Triglycerides: 61 mg/dL (ref <150)
VLDL: 12 mg/dL (ref 0–40)

## 2019-02-22 LAB — DIGOXIN LEVEL: Digoxin Level: <0.2 ng/mL — ABNORMAL LOW (ref 0.8–2.0)

## 2019-02-22 LAB — MAGNESIUM: Magnesium: 1.9 mg/dL (ref 1.7–2.4)

## 2019-02-22 NOTE — Progress Notes (Signed)
Advanced Heart Failure Clinic Progress Note    Patient ID: Philip Chavez, male   DOB: 12-01-1950, 69 y.o.   MRN: [id number] PCP: Dr. Gerlean Ren EP: Dr. Lovena Le HF cardiologist: Dr Aundra Dubin  69 y.o. with history of complete heart block/Medtronic PPM, cardiomyopathy, and atrial flutter s/p ablation presents for CHF clinic evaluation.  Patient developed complete heart block in 2/15 and had PPM placed at that time.  He paces his RV continuously.  In 2/15, EF was 50-55% by echo.  By 6/15, EF had fallen to 20-25%.  Cardiolite showed possible scar but no ischemia.  On 09/03/14, he was admitted with dyspnea and found to be in atrial flutter with RLL PNA and with volume overload.  Echo showed EF 15% with diffuse hypokinesis.  He was started on IV Lasix and IV amiodarone. He converted back to NSR.  He subsequently had atrial flutter ablation.  He had right and left heart cath in 9/16, showing nonobstructive CAD and optimized filling pressures with CI 2.09.  He subsequently had upgrade to CRT by Dr Lovena Le.  EF up to 30-35% in 4/17.  Subsequently, the LV lead dislodged.  He was offered lead revision, but preferred watchful waiting. Echo in 5/18 showed EF down to 20%.  Echo repeated again 10/20 and EF was < 20%. Per chart review, pt had scheduled ICD transmission from 02/18/19 that revealed VT episode on 02/03/19 around 02:19, initially under VT zone detection of 162 bpm. Pt contacted by EP clinic and was reportedly asleep and asymptomatic during episode. He has f/u w/ Dr. Lovena Le 03/24/19.   Presents to AHF clinic today for f/u. Wt is stable at 214 lb (213 last OV). Feels well since last visit. No complaints. Denies dyspnea, CP, palpitations, syncope/ near syncope, LEE, orthopnea or wt gain. No exertional symptoms. Reports full compliance w/ meds. No side effects. BP [id number] systolic prior to am meds. He is fasting today.    Device interrogation today shows multiple runs of NSVT. No sustained VT/VF. No delivered therapies.   Intermittent AFib. Optivol good. No fluid overload.   Labs (8/16): K 3.9, creatinine 1.03, AST 106, ALT 173, HCT 45.1, LDL 132 Labs (9/16): K 3.9, creatinine 1.16, HCT 42.5 Labs (10/16): K 3.9, creatinine 1.32, HCT 39.9 Labs (2/17): K 4.1, creatinine 1.26, BNP 107, digoxin 0.3 Labs (07/10/2015): K 3.9 Creatinine 1.23  Labs (1/18): K 4.1, creatinine 1.18, digoxin 0.3 Labs (2/18): K 4, creatinine 1.24, digoxin 0.4 Labs (5/18): K 4, creatinine 1.25, hgb 14.8, digoxin 0.5 Labs (10/18): K 4, creatinine 1.24, digoxin 0.2 Labs (4/19): K 3.8, creatinine 1.22, digoxin 0.5, LDL 69, HDL 40 Labs (8/19): K 3.9, creatinine 1.35, digoxin 0.3 Labs (12/19): K 4, creatinine 1.22  ECG (personally reviewed): atrial sensed, ventricular paced, 76 bpm   PMH: 1. H/o complete heart block: Has Medtronic PPM, placed in 2/15.   2. Cardiomyopathy: Echo (2/15) prior to PPM placement with EF 50-55%.  Echo (6/15) with EF 20-25%,  Cardiolite at that time showed possible scar but no ischemia.  Echo (8/16) with EF 15%, diffuse hypokinesis, mildly dilated RV with normal systolic function, PA systolic pressure 69 mmHg.  LHC/RHC (9/16) with mean RA 4, PA 31/15 mean 20, mean PCWP 8, CI 2.09, 40% mLAD stenosis.  Upgrade to MDT CRT-D in 9/16.  - Echo (4/17) with EF 30-35%.   - LV lead dislodged and loss of BiV pacing.  - Echo (5/18) with EF 20%, mild LV dilation, normal RV size with mildly decreased systolic function,  mild AI.  3. CKD 4. 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  5. Atrial tachycardia: paroxysmal. 6. Atrial flutter: s/p ablation in 8/16.  7. OSA 8. Hyperlipidemia 9. Shingles with post-herpetic neuralgia.  10. Atrial fibrillation: Paroxysmal.   FH: Father with CHF, complete heart block with PPM.  Sister with complete heart block and PPM.   SH: Retired Curator, nonsmoker, lives in [place]: All systems reviewed and negative except as per HPI  Current Outpatient Medications  Medication Sig Dispense  Refill  . acetaminophen (TYLENOL) 325 MG tablet Take 1-2 tablets (325-650 mg total) by mouth every 4 (four) hours as needed for mild pain.    . bisoprolol (ZEBETA) 5 MG tablet TAKE 1 TABLET BY MOUTH DAILY. 30 tablet 5  . digoxin (LANOXIN) 0.125 MG tablet Take 1 tablet By Mouth Daily. 30 tablet 3  . ELIQUIS 5 MG TABS tablet TAKE 1 TABLET BY MOUTH 2 TIMES DAILY. 60 tablet 5  . furosemide (LASIX) 20 MG tablet TAKE 1 TABLET (20 MG TOTAL) 2 (TWO) TIMES DAILY BY MOUTH. 60 tablet 5  . hydrALAZINE (APRESOLINE) 25 MG tablet TAKE 1/2 TABLET BY MOUTH 3 TIMES DAILY. 135 tablet 3  . isosorbide mononitrate (IMDUR) 30 MG 24 hr tablet Take 1 tablet (30 mg total) by mouth daily. 30 tablet 3  . sacubitril-valsartan (ENTRESTO) 24-26 MG Take 1 tablet by mouth 2 (two) times daily. 60 tablet 3  . simvastatin (ZOCOR) 40 MG tablet TAKE 1 TABLET BY MOUTH EVERY DAY 90 tablet 3  . spironolactone (ALDACTONE) 25 MG tablet TAKE 1 TABLET BY MOUTH DAILY. 90 tablet 3   No current facility-administered medications for this encounter.   BP 123/68   Pulse 74   Wt 96.9 kg (213 lb 9.6 oz)   SpO2 98%   BMI 28.18 kg/m  PHYSICAL EXAM: General:  Well appearing. No respiratory difficulty HEENT: normal Neck: supple. no JVD. Carotids 2+ bilat; no bruits. No lymphadenopathy or thyromegaly appreciated. Cor: PMI nondisplaced. Regular rate & rhythm. No rubs, gallops or murmurs. Lungs: clear Abdomen: soft, nontender, nondistended. No hepatosplenomegaly. No bruits or masses. Good bowel sounds. Extremities: no cyanosis, clubbing, rash, edema Neuro: alert & oriented x 3, cranial nerves grossly intact. moves all 4 extremities w/o difficulty. Affect pleasant.   Assessment/Plan: 1. Chronic systolic CHF: Nonischemic cardiomyopathy.  EF 15% on echo in 8/16.  Patient had borderline reduced EF when PPM was placed in 2/15.  After that, EF fell significantly.  He has a history of complete heart block and father had CHF and CHB with pacemaker,  and sounds like sister also had CHB with pacemaker. 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  He had CRT upgrade.  No significant CAD on last cath.  Echo in 4/17 showed improvement in EF to 30-35%.  However, his LV lead has dislodged and he is no longer BiV pacing, now RV pacing > 99% of the time.  Echo in 5/18 showed EF down to 20%. Repeat Echo 10/20 EF < 30%. Symptomatically, he continues to do very well (NYHA class I-II). Volume stable. No signs of fluid overload by exam no optivol. SBP is too soft to titrate up meds today (120 prior to AM meds)  - Continue bisoprolol 5 mg daily.  - Continue  Entresto 24/26 bid  - Continue spironolactone 25 daily - Continue Lasix 20 bid - Continue hydralazine 12.5 mg tid - Continue  Imdur 30 daily.   - Continue  digoxin 0.125 mg - Check Dig level + BMP today  - Echo in 5/18 showed EF reduced to 20%, possibly due to loss of BiV pacing. Echo repeated 10/20 and EF <20%.  He has minimal symptoms and has wanted to hold off on replacement of LV lead => if he worsens symptomatically, would strongly recommend replacement of LF lead.   - We have discussed genetic evaluation to look for LMNA or SCN5A mutations that could be used for familial screening, he has wanted to hold off.  2. Atrial flutter: s/p ablation. EKG today a paced. HR 76.  3. Atrial fibrillation: Currently in NSR.  - Continue Eliquis. Denies abnormal bleeding, CBC today. 3. PE/DVT: >1 year ago and associated with surgery.   4. Hyperlipidemia: He has mild CAD.   - No ASA with Eliquis.   - Continue simvastatin 40 mg daily. He is fasting. Will check LP + HFTs today  5. Complete heart block: Now RV pacing with loss of LV lead.  6. VT: pt had scheduled ICD transmission from 02/18/19 that revealed VT episode on 02/03/19 around 02:19, initially under VT zone detection of 162 bpm (see EP phone note). Pt contacted by EP clinic and was reportedly asleep and asymptomatic  during episode. He has f/u w/ Dr. Lovena Le 03/24/19.  - Device interrogation done today by [employer] Rep.reveals frequent NSVT. No VF.   - check BMP and Mg level today  - discussed possible addition of amiodarone, but pt declined opting he will further discuss w/ Dr. Lovena Le at [place] appt.   Followup w/ Dr. Aundra Dubin in 3 months   Bronwen Pendergraft Rosita Fire PA-C  02/22/2019

## 2019-02-22 NOTE — Patient Instructions (Addendum)
Lab work done today. We will notify you of any abnormal lab work. No news is good news!  EKG done today.  Please follow up with the [facility] in 3 months.  At the [facility], you and your health needs are our priority. As part of our continuing mission to provide you with exceptional heart care, we have created designated Provider Care Teams. These Care Teams include your primary Cardiologist (physician) and Advanced Practice Providers (APPs- Physician Assistants and Nurse Practitioners) who all work together to provide you with the care you need, when you need it.   You may see any of the following providers on your designated Care Team at your next follow up: Marland Kitchen Dr Glori Bickers . Dr Loralie Champagne . Darrick Grinder, NP . Lyda Jester, PA . Audry Riles, PharmD   Please be sure to bring in all your medications bottles to every appointment.

## 2019-03-01 ENCOUNTER — Ambulatory Visit (INDEPENDENT_AMBULATORY_CARE_PROVIDER_SITE_OTHER): Payer: Medicare PPO

## 2019-03-01 DIAGNOSIS — I5042 Chronic combined systolic (congestive) and diastolic (congestive) heart failure: Secondary | ICD-10-CM | POA: Diagnosis not present

## 2019-03-01 DIAGNOSIS — Z95810 Presence of automatic (implantable) cardiac defibrillator: Secondary | ICD-10-CM

## 2019-03-02 NOTE — Progress Notes (Signed)
EPIC Encounter for ICM Monitoring  Patient Name: Philip Chavez is a 69 y.o. male Date: 03/02/2019 Primary Care Physican: Wilber Oliphant, MD Primary [place] Electrophysiologist: Lovena Le RV Pacing:98.2% 1/18/2021Weight:213lbs (office weight)  Clinical Status (22-Feb-2019 to 01-Mar-2019)  Treated VT/VF  0 episodes    AT/AF              1 episode    Time in AT/AF  0.5 hr/day (2.2%)     Spoke with patient and he is asymptomatic.  He is without complaints.  [place] with Dr Aundra Dubin 8/24 and per note his LV lead has dislodged and he is no longer BiV pacing.  Prescribed dosage:Furosemide 20 mg 1 tablet twice a day.  Labs: 09/28/2018 Creatinine 1.22, BUN 11, Potassium 4.2, Sodium 139, GFR >60 A complete set of results can be found in Results Review.  Recommendations:No changes and encouraged to call if experiencing any fluid symptoms.  Follow-up plan: ICM clinic phone appointment on 04/05/2019.   91 day device clinic remote transmission 05/19/2019.  Office appt 03/24/2019 with Dr. Lovena Le.    Copy of ICM check sent to Dr. Lovena Le.   3 month ICM trend: 03/01/2019    1 Year ICM trend:       Rosalene Billings, RN 03/02/2019 3:57 PM

## 2019-03-10 ENCOUNTER — Encounter: Payer: Medicare PPO | Admitting: Student

## 2019-03-11 MED FILL — DIGOXIN 0.125 MG TABLET: 125 | 30 days supply | Qty: 30 | Fill #1

## 2019-03-18 MED FILL — BISOPROLOL FUMARATE 5 MG TA: 5 | 30 days supply | Qty: 30 | Fill #5

## 2019-03-21 MED FILL — ELIQUIS 5 MG TABLET: 5 | 30 days supply | Qty: 60 | Fill #5

## 2019-03-21 MED FILL — ENTRESTO 24 MG-26 MG TABLET: 24-26 | 30 days supply | Qty: 60 | Fill #2

## 2019-03-24 ENCOUNTER — Encounter: Payer: Self-pay | Admitting: Internal Medicine

## 2019-03-24 ENCOUNTER — Ambulatory Visit: Payer: Medicare PPO | Admitting: Internal Medicine

## 2019-03-24 ENCOUNTER — Other Ambulatory Visit: Payer: Self-pay

## 2019-03-24 VITALS — BP 102/68 | HR 54 | Ht 73.0 in | Wt 210.4 lb

## 2019-03-24 DIAGNOSIS — I442 Atrioventricular block, complete: Secondary | ICD-10-CM | POA: Diagnosis not present

## 2019-03-24 DIAGNOSIS — Z950 Presence of cardiac pacemaker: Secondary | ICD-10-CM

## 2019-03-24 DIAGNOSIS — I480 Paroxysmal atrial fibrillation: Secondary | ICD-10-CM | POA: Diagnosis not present

## 2019-03-24 DIAGNOSIS — I5042 Chronic combined systolic (congestive) and diastolic (congestive) heart failure: Secondary | ICD-10-CM

## 2019-03-24 NOTE — Patient Instructions (Signed)
Medication Instructions:  Your physician recommends that you continue on your current medications as directed. Please refer to the Current Medication list given to you today.  Labwork: None ordered.  Testing/Procedures: None ordered.  Follow-Up: Your physician wants you to follow-up in: one year with Dr. Lovena Le.   You will receive a reminder letter in the mail two months in advance. If you don't receive a letter, please call our office to schedule the follow-up appointment.  Remote monitoring is used to monitor your Pacemaker from home. This monitoring reduces the number of office visits required to check your device to one time per year. It allows [place] to keep an eye on the functioning of your device to ensure it is working properly. You are scheduled for a device check from home on 04/05/2019. You may send your transmission at any time that day. If you have a wireless device, the transmission will be sent automatically. After your physician reviews your transmission, you will receive a postcard with your next transmission date.  Any Other Special Instructions Will Be Listed Below (If Applicable).  If you need a refill on your cardiac medications before your next appointment, please call your pharmacy.

## 2019-03-24 NOTE — Progress Notes (Signed)
HPI Philip Chavez returns today for followup. He is a pleasant 69 yo man with CHB who developed a CM, thought due in part to ventricular pacing and under went biv ICD insertion. His LV lead dislodged. He has had class 2 CHF symptoms and has been treated with maximal medical therapy under the direction of Dr. Reine Just. He has been noted to have NSVT. There was a question of amiodarone initiation. He has also had PAF. He is asymptomatic.  No Known Allergies   Current Outpatient Medications  Medication Sig Dispense Refill  . acetaminophen (TYLENOL) 325 MG tablet Take 1-2 tablets (325-650 mg total) by mouth every 4 (four) hours as needed for mild pain.    . bisoprolol (ZEBETA) 5 MG tablet TAKE 1 TABLET BY MOUTH DAILY. 30 tablet 5  . digoxin (LANOXIN) 0.125 MG tablet Take 1 tablet By Mouth Daily. 30 tablet 3  . ELIQUIS 5 MG TABS tablet TAKE 1 TABLET BY MOUTH 2 TIMES DAILY. 60 tablet 5  . furosemide (LASIX) 20 MG tablet TAKE 1 TABLET (20 MG TOTAL) 2 (TWO) TIMES DAILY BY MOUTH. 60 tablet 5  . hydrALAZINE (APRESOLINE) 25 MG tablet TAKE 1/2 TABLET BY MOUTH 3 TIMES DAILY. 135 tablet 3  . sacubitril-valsartan (ENTRESTO) 24-26 MG Take 1 tablet by mouth 2 (two) times daily. 60 tablet 3  . simvastatin (ZOCOR) 40 MG tablet TAKE 1 TABLET BY MOUTH EVERY DAY 90 tablet 3  . spironolactone (ALDACTONE) 25 MG tablet TAKE 1 TABLET BY MOUTH DAILY. 90 tablet 3  . isosorbide mononitrate (IMDUR) 30 MG 24 hr tablet Take 1 tablet (30 mg total) by mouth daily. 30 tablet 3   No current facility-administered medications for this visit.     Past Medical History:  Diagnosis Date  . AICD (automatic cardioverter/defibrillator) present 0919/2016   BIV   . Atrial flutter ([place])    a. 08/2014 presented w/ aflutter->converted on amio;  b. CHA2DS2VASc = 2 -->Xarelto.  . Atrial tachycardia ([place])   . Chronic combined systolic and diastolic CHF (congestive heart failure) ([place])    a. 03/2013 Echo EF 50-55%;  b. 07/2013 Echo EF  20-25%, diff HK, Gr3 DD;  c. 08/2014 Echo: EF 15%, diff HK, mild AI/MR, mildly dil LA/RV, mod TR, PASP 14mmHg.  Marland Kitchen DVT (deep venous thrombosis) ([place])    a. 2007 RLE: S/P ankle surgery;  b. 03/2013 LLE DVT & PE-->Xarelto.  . 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 2007 RLE: S/P ankle surgery;  b. 03/2013 LLE DVT & PE-->Xarelto.  . Hypercholesterolemia   . Hypercoagulable state ([place])   . Hypertension   . Musculoskeletal neck pain   . Nonischemic cardiomyopathy ([place])    a. 07/2013 Echo: EF 20-25%;  b. 07/2013 Myoview: Large inferior, lateral, apical scar w/ HK, no ischemia, EF 17%;  b. 08/2014 Echo: EF 15%, diff HK.  Marland Kitchen Pneumonia ~ 04/2014; 09/02/2014  . Presence of permanent cardiac pacemaker    a. 03/2013 s/p MDT ADDRL1 Adapta DC PPM, ser # [id number] H.  . Sleep apnea    does not wear mask (09/02/2014)  . Third degree heart block ([place])    a. 03/2013 s/p MDT ADDRL1 Adapta DC PPM, ser # [id number] H.    ROS:   All systems reviewed and negative except as noted in the HPI.   Past Surgical History:  Procedure Laterality Date  . CARDIAC CATHETERIZATION N/A 10/17/2014   Procedure: Right/Left Heart Cath and Coronary Angiography;  Surgeon: Larey Dresser,  MD;  Location: [facility];  Service: Cardiovascular;  Laterality: N/A;  . ELECTROPHYSIOLOGIC STUDY N/A 09/07/2014   Procedure: A-Flutter;  Surgeon: Evans Lance, MD;  Location: [facility];  Service: Cardiovascular;  Laterality: N/A;  . EP IMPLANTABLE DEVICE  10/24/2014   BIV  . EP IMPLANTABLE DEVICE N/A 10/24/2014   Procedure: BiV ICD Upgrade;  Surgeon: Evans Lance, MD;  Location: [facility];  Service: Cardiovascular;  Laterality: N/A;  . FOOT FRACTURE SURGERY Right 2007  . FRACTURE SURGERY    . INGUINAL HERNIA REPAIR Right 1980's  . INSERT / REPLACE / REMOVE PACEMAKER     MDT ADDRL1 pacemaker implanted by Dr Lovena Le for complete heart block  . PERMANENT PACEMAKER INSERTION N/A 03/15/2013   Procedure: PERMANENT  PACEMAKER INSERTION;  Surgeon: Evans Lance, MD;  Location: [facility];  Service: Cardiovascular;  Laterality: N/A;     Family History  Problem Relation Age of Onset  . Diabetes type II Mother   . Heart disease Father   . Arrhythmia Father   . Arrhythmia Sister   . Hypertension Neg Hx   . Heart attack Neg Hx   . Stroke Neg Hx      Social History   Socioeconomic History  . Marital status: Married    Spouse name: Not on file  . Number of children: Not on file  . Years of education: Not on file  . Highest education level: Not on file  Occupational History  . Not on file  Tobacco Use  . Smoking status: Former Smoker    Packs/day: 1.50    Years: 10.00    Pack years: 15.00    Types: Cigarettes  . Smokeless tobacco: Never Used  . Tobacco comment: 09/02/2014  "quit smoking 20-30 yr ago"  Substance and Sexual Activity  . Alcohol use: No  . Drug use: No  . Sexual activity: Yes  Other Topics Concern  . Not on file  Social History Narrative  . Not on file   Social Determinants of Health   Financial Resource Strain:   . Difficulty of Paying Living Expenses: Not on file  Food Insecurity:   . Worried About Charity fundraiser in the Last Year: Not on file  . Ran Out of Food in the Last Year: Not on file  Transportation Needs:   . Lack of Transportation (Medical): Not on file  . Lack of Transportation (Non-Medical): Not on file  Physical Activity:   . Days of Exercise per Week: Not on file  . Minutes of Exercise per Session: Not on file  Stress:   . Feeling of Stress : Not on file  Social Connections:   . Frequency of Communication with Friends and Family: Not on file  . Frequency of Social Gatherings with Friends and Family: Not on file  . Attends Religious Services: Not on file  . Active Member of Clubs or Organizations: Not on file  . Attends Archivist Meetings: Not on file  . Marital Status: Not on file  Intimate Partner Violence:   . Fear of Current  or Ex-Partner: Not on file  . Emotionally Abused: Not on file  . Physically Abused: Not on file  . Sexually Abused: Not on file     BP 102/68   Pulse (!) 54   Ht 6\' 1"  (1.854 m)   Wt 210 lb 6.4 oz (95.4 kg)   SpO2 97%   BMI 27.76 kg/m  Physical Exam:  Well appearing NAD HEENT: Unremarkable Neck:  No JVD, no thyromegally Lymphatics:  No adenopathy Back:  No CVA tenderness Lungs:  Clear HEART:  Regular rate rhythm, no murmurs, no rubs, no clicks Abd:  soft, positive bowel sounds, no organomegally, no rebound, no guarding Ext:  2 plus pulses, no edema, no cyanosis, no clubbing Skin:  No rashes no nodules Neuro:  CN II through XII intact, motor grossly intact   DEVICE  Normal device function.  See PaceArt for details. Reviewed episodes of NSVT and PAF  Assess/Plan: 1. VT - he is asymptomatic. He has infrequent episodes. I have discussed the treatment options with the patient and recommended watchful waiting rather than initiation of AA drug therapy.  2. PAF - he has minimal episodes (99.9% NSR) 3. ICD - his medtronic ICD is functioning as a DDD ICD, not a biv and LV lead is turned off as it had retracted into the CS.  4. Chronic systolic heart failure - his symptoms are class 2. He is on maximal medical therapy.  Philip Chavez.

## 2019-04-02 ENCOUNTER — Ambulatory Visit: Payer: Medicare PPO | Attending: Internal Medicine

## 2019-04-02 DIAGNOSIS — Z23 Encounter for immunization: Secondary | ICD-10-CM | POA: Insufficient documentation

## 2019-04-02 NOTE — Progress Notes (Signed)
   Covid-19 Vaccination Clinic  Name:  Philip Chavez    MRN: [id number] DOB: 1950/12/29  04/02/2019  [name] was observed post Covid-19 immunization for 15 minutes without incidence. He was provided with Vaccine Information Sheet and instruction to access the V-Safe system.   [name] was instructed to call 911 with any severe reactions post vaccine: Marland Kitchen Difficulty breathing  . Swelling of your face and throat  . A fast heartbeat  . A bad rash all over your body  . Dizziness and weakness    Immunizations Administered    Name Date Dose VIS Date Route   Pfizer COVID-19 Vaccine 04/02/2019 12:18 PM 0.3 mL 01/15/2019 Intramuscular   Manufacturer: [place]   Lot: [id number]   [place]: [id number]

## 2019-04-05 ENCOUNTER — Ambulatory Visit (INDEPENDENT_AMBULATORY_CARE_PROVIDER_SITE_OTHER): Payer: Medicare PPO

## 2019-04-05 DIAGNOSIS — I5042 Chronic combined systolic (congestive) and diastolic (congestive) heart failure: Secondary | ICD-10-CM

## 2019-04-05 DIAGNOSIS — Z95810 Presence of automatic (implantable) cardiac defibrillator: Secondary | ICD-10-CM | POA: Diagnosis not present

## 2019-04-06 NOTE — Progress Notes (Signed)
EPIC Encounter for ICM Monitoring  Patient Name: Philip Chavez is a 69 y.o. male Date: 04/06/2019 Primary Care Physican: Wilber Oliphant, MD Primary [place] Electrophysiologist: Lovena Le RV Pacing:99.2% 3/2/2021Weight:212lbs  Clinical Status (24-Mar-2019 to 05-Apr-2019)    Time in AT/AF  0.9 hr/day (3.7%)  Longest AT/AF  9 hours     Spoke with patient and he is asymptomatic. He is without complaints.  [place] with Dr Aundra Dubin 8/24 and per note his LV lead has dislodged and he is no longer BiV pacing.  Prescribed dosage:Furosemide 20 mg 1 tablet twice a day.  Labs: 02/22/2019 Creatinine 1.34, BUN 15, Potassium 4.2, Sodium 137, GFR 54->60 09/28/2018 Creatinine 1.22, BUN 11, Potassium 4.2, Sodium 139, GFR >60 A complete set of results can be found in Results Review.  Recommendations:No changes and encouraged to call if experiencing any fluid symptoms.  Follow-up plan: ICM clinic phone appointment on 05/10/2019.   91 day device clinic remote transmission 05/19/2019.    Copy of ICM check sent to Dr. Lovena Le.   3 month ICM trend: 04/05/2019    1 Year ICM trend:       Rosalene Billings, RN 04/06/2019 1:55 PM

## 2019-04-14 MED FILL — DIGOXIN 0.125 MG TABLET: 125 | 30 days supply | Qty: 30 | Fill #2

## 2019-04-19 ENCOUNTER — Other Ambulatory Visit (HOSPITAL_COMMUNITY): Payer: Self-pay | Admitting: Cardiology

## 2019-04-19 MED FILL — BISOPROLOL FUMARATE 5 MG TA: 5 | 90 days supply | Qty: 90 | Fill #0

## 2019-04-22 ENCOUNTER — Other Ambulatory Visit (HOSPITAL_COMMUNITY): Payer: Self-pay

## 2019-04-22 MED ORDER — APIXABAN 5 MG PO TABS: 5.0000 mg | ORAL_TABLET | Freq: Two times a day (BID) | ORAL | 5 refills | Status: DC

## 2019-04-22 MED FILL — ELIQUIS 5 MG TABLET: 5 | 30 days supply | Qty: 60 | Fill #0

## 2019-04-22 MED FILL — ENTRESTO 24 MG-26 MG TABLET: 24-26 | 30 days supply | Qty: 60 | Fill #3

## 2019-04-28 ENCOUNTER — Other Ambulatory Visit (HOSPITAL_COMMUNITY): Payer: Self-pay | Admitting: Internal Medicine

## 2019-04-28 ENCOUNTER — Ambulatory Visit: Payer: Medicare PPO | Attending: Internal Medicine

## 2019-04-28 DIAGNOSIS — Z23 Encounter for immunization: Secondary | ICD-10-CM

## 2019-04-28 NOTE — Progress Notes (Signed)
   Covid-19 Vaccination Clinic  Name:  Philip Chavez    MRN: [id number] DOB: Nov 28, 1950  04/28/2019  [name] was observed post Covid-19 immunization for 15 minutes without incident. He was provided with Vaccine Information Sheet and instruction to access the V-Safe system.   [name] was instructed to call 911 with any severe reactions post vaccine: Marland Kitchen Difficulty breathing  . Swelling of face and throat  . A fast heartbeat  . A bad rash all over body  . Dizziness and weakness   Immunizations Administered    Name Date Dose VIS Date Route   Pfizer COVID-19 Vaccine 04/28/2019  8:29 AM 0.3 mL 01/15/2019 Intramuscular   Manufacturer: [place]   Lot: [id number]   [place]: [id number]

## 2019-04-29 MED FILL — SPIRONOLACTONE 25 MG TABS: 25 | 90 days supply | Qty: 90 | Fill #0

## 2019-05-10 ENCOUNTER — Ambulatory Visit (INDEPENDENT_AMBULATORY_CARE_PROVIDER_SITE_OTHER): Payer: Medicare PPO

## 2019-05-10 DIAGNOSIS — Z95810 Presence of automatic (implantable) cardiac defibrillator: Secondary | ICD-10-CM | POA: Diagnosis not present

## 2019-05-10 DIAGNOSIS — I5042 Chronic combined systolic (congestive) and diastolic (congestive) heart failure: Secondary | ICD-10-CM

## 2019-05-12 NOTE — Progress Notes (Signed)
EPIC Encounter for ICM Monitoring  Patient Name: Philip Chavez is a 69 y.o. male Date: 05/12/2019 Primary Care Physican: Wilber Oliphant, MD Primary [place] Electrophysiologist: Lovena Le RV Pacing:99.1% 3/2/2021Weight:212lbs  Clinical Status (05-Apr-2019 to 10-May-2019)  AT/AF             2 episodes   Time in AT/AF 0.5 hr/day (2.1%)  VT-NS (>4 beats, >162 bpm) 6 episodes Observations (1) (05-Apr-2019 to 10-May-2019)   AT/AF >= 6 hr for 2 days.    Spoke with patient and he is doing well.    [place] with Dr Aundra Dubin 8/24 and per note his LV lead has dislodged and he is no longer BiV pacing.  Prescribed:Furosemide 20 mg 1 tablet twice a day.  Labs: 02/22/2019 Creatinine 1.34, BUN 15, Potassium 4.2, Sodium 137, GFR 54->60 09/28/2018 Creatinine 1.22, BUN 11, Potassium 4.2, Sodium 139, GFR >60 A complete set of results can be found in Results Review.  Recommendations: No changes and encouraged to call if experiencing any fluid symptoms.   Follow-up plan: ICM clinic phone appointment on5/12/2019. 91 day device clinic remote transmission 05/19/2019.   Copy of ICM check sent to Dr.Taylor.   3 month ICM trend: 05/10/2019    1 Year ICM trend:       Rosalene Billings, RN 05/12/2019 8:25 AM

## 2019-05-14 MED FILL — DIGOXIN 0.125 MG TABLET: 125 | 30 days supply | Qty: 30 | Fill #3

## 2019-05-17 ENCOUNTER — Telehealth: Payer: Self-pay | Admitting: Internal Medicine

## 2019-05-17 NOTE — Telephone Encounter (Signed)
Returned call to Pt.  Pt has had acute swelling of his abdomen/groin area.  States he has pain with walking.    He states he has a history of hernia, but also has advanced heart failure and could be fluid.  Pt is very concerned.  Will make appt with LI for 05/18/19 at 1:45 pm in person so Pt can be evaluated.  Pt in agreement.

## 2019-05-17 NOTE — Telephone Encounter (Signed)
Patient would a nurse to contact him so he talk to them about his medical condition.

## 2019-05-18 ENCOUNTER — Encounter: Payer: Self-pay | Admitting: Cardiology

## 2019-05-18 ENCOUNTER — Other Ambulatory Visit: Payer: Self-pay

## 2019-05-18 ENCOUNTER — Ambulatory Visit: Payer: Medicare PPO | Admitting: Cardiology

## 2019-05-18 VITALS — BP 110/64 | HR 70 | Ht 73.2 in | Wt 212.0 lb

## 2019-05-18 DIAGNOSIS — Z95810 Presence of automatic (implantable) cardiac defibrillator: Secondary | ICD-10-CM | POA: Diagnosis not present

## 2019-05-18 DIAGNOSIS — K4090 Unilateral inguinal hernia, without obstruction or gangrene, not specified as recurrent: Secondary | ICD-10-CM | POA: Diagnosis not present

## 2019-05-18 DIAGNOSIS — I5042 Chronic combined systolic (congestive) and diastolic (congestive) heart failure: Secondary | ICD-10-CM

## 2019-05-18 NOTE — Patient Instructions (Signed)
Medication Instructions:  Your physician recommends that you continue on your current medications as directed. Please refer to the Current Medication list given to you today.  *If you need a refill on your cardiac medications before your next appointment, please call your pharmacy*   Follow-Up: At [facility], you and your health needs are our priority.  As part of our continuing mission to provide you with exceptional heart care, we have created designated Provider Care Teams.  These Care Teams include your primary Cardiologist (physician) and Advanced Practice Providers (APPs -  Physician Assistants and Nurse Practitioners) who all work together to provide you with the care you need, when you need it.  We recommend signing up for the patient portal called "MyChart".  Sign up information is provided on this After Visit Summary.  MyChart is used to connect with patients for Virtual Visits (Telemedicine).  Patients are able to view lab/test results, encounter notes, upcoming appointments, etc.  Non-urgent messages can be sent to your provider as well.   To learn more about what you can do with MyChart, go to [email or web address].     Other Instructions You have been referred to have a consult with General Surgery.

## 2019-05-18 NOTE — Progress Notes (Signed)
Cardiology Office Note   Date:  05/18/2019   ID:  Maziar Saley, DOB 03/08/1950, MRN [id number]  PCP:  Wilber Oliphant, MD  Cardiologist:  Dr. Aundra Dubin EP Dr Lovena Le     Chief Complaint  Patient presents with  . Leg Swelling     rt groin swelling      History of Present Illness: Philip Chavez is a 69 y.o. male who presents for acute abd pain swelling   Saw Dr. Lovena Le 03/24/19 with CHB who developed a CM, thought due in part to ventricular pacing and under went biv ICD insertion. His LV lead dislodged. He has had class 2 CHF symptoms and has been treated with maximal medical therapy under the direction of Dr. Reine Just. He has been noted to have NSVT. There was a question of amiodarone initiation. He has also had PAF. His MDT ICD is DDD ICD not a biv and LV lead is turned off as it had retracted into the CS.  PAF followed through ICD but has been 99% SR.  Lipids in Jan 2021   His VT is to be monitored.  No AA currently. Today -  No chest pain, no SOB no lightheadedness no dizziness.  He developed Rt groin pain swelling.  He has had Lt ing. Hernia repaired many years ago - no fevers no severe pain, he has some with increase of activity.     Past Medical History:  Diagnosis Date  . AICD (automatic cardioverter/defibrillator) present 0919/2016   BIV   . Atrial flutter ([place])    a. 08/2014 presented w/ aflutter->converted on amio;  b. CHA2DS2VASc = 2 -->Xarelto.  . Atrial tachycardia ([place])   . Chronic combined systolic and diastolic CHF (congestive heart failure) ([place])    a. 03/2013 Echo EF 50-55%;  b. 07/2013 Echo EF 20-25%, diff HK, Gr3 DD;  c. 08/2014 Echo: EF 15%, diff HK, mild AI/MR, mildly dil LA/RV, mod TR, PASP 75mmHg.  Marland Kitchen DVT (deep venous thrombosis) ([place])    a. 2007 RLE: S/P ankle surgery;  b. 03/2013 LLE DVT & PE-->Xarelto.  . 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 2007 RLE: S/P ankle surgery;  b. 03/2013 LLE DVT & PE-->Xarelto.  . Hypercholesterolemia   . Hypercoagulable state  ([place])   . Hypertension   . Musculoskeletal neck pain   . Nonischemic cardiomyopathy ([place])    a. 07/2013 Echo: EF 20-25%;  b. 07/2013 Myoview: Large inferior, lateral, apical scar w/ HK, no ischemia, EF 17%;  b. 08/2014 Echo: EF 15%, diff HK.  Marland Kitchen Pneumonia ~ 04/2014; 09/02/2014  . Presence of permanent cardiac pacemaker    a. 03/2013 s/p MDT ADDRL1 Adapta DC PPM, ser # [id number].  . Sleep apnea    does not wear mask (09/02/2014)  . Third degree heart block ([place])    a. 03/2013 s/p MDT ADDRL1 Adapta DC PPM, ser # [id number].    Past Surgical History:  Procedure Laterality Date  . CARDIAC CATHETERIZATION N/A 10/17/2014   Procedure: Right/Left Heart Cath and Coronary Angiography;  Surgeon: Larey Dresser, MD;  Location: [facility];  Service: Cardiovascular;  Laterality: N/A;  . ELECTROPHYSIOLOGIC STUDY N/A 09/07/2014   Procedure: A-Flutter;  Surgeon: Evans Lance, MD;  Location: [facility];  Service: Cardiovascular;  Laterality: N/A;  . EP IMPLANTABLE DEVICE  10/24/2014   BIV  . EP IMPLANTABLE DEVICE N/A 10/24/2014   Procedure: BiV ICD Upgrade;  Surgeon: Evans Lance, MD;  Location: [facility]  INVASIVE CV LAB;  Service: Cardiovascular;  Laterality: N/A;  . FOOT FRACTURE SURGERY Right 2007  . FRACTURE SURGERY    . INGUINAL HERNIA REPAIR Right 1980's  . INSERT / REPLACE / REMOVE PACEMAKER     MDT ADDRL1 pacemaker implanted by Dr Lovena Le for complete heart block  . PERMANENT PACEMAKER INSERTION N/A 03/15/2013   Procedure: PERMANENT PACEMAKER INSERTION;  Surgeon: Evans Lance, MD;  Location: [facility] CATH LAB;  Service: Cardiovascular;  Laterality: N/A;     Current Outpatient Medications  Medication Sig Dispense Refill  . acetaminophen (TYLENOL) 325 MG tablet Take 1-2 tablets (325-650 mg total) by mouth every 4 (four) hours as needed for mild pain.    Marland Kitchen apixaban (ELIQUIS) 5 MG TABS tablet Take 1 tablet (5 mg total) by mouth 2 (two) times daily. 60 tablet 5  . bisoprolol (ZEBETA) 5 MG tablet  TAKE 1 TABLET BY MOUTH DAILY. 90 tablet 3  . digoxin (LANOXIN) 0.125 MG tablet Take 1 tablet By Mouth Daily. 30 tablet 3  . furosemide (LASIX) 20 MG tablet TAKE 1 TABLET (20 MG TOTAL) 2 (TWO) TIMES DAILY BY MOUTH. 60 tablet 5  . hydrALAZINE (APRESOLINE) 25 MG tablet TAKE 1/2 TABLET BY MOUTH 3 TIMES DAILY. 135 tablet 3  . sacubitril-valsartan (ENTRESTO) 24-26 MG Take 1 tablet by mouth 2 (two) times daily. 60 tablet 3  . simvastatin (ZOCOR) 40 MG tablet TAKE 1 TABLET BY MOUTH EVERY DAY 90 tablet 3  . spironolactone (ALDACTONE) 25 MG tablet TAKE 1 TABLET BY MOUTH DAILY. 90 tablet 3  . isosorbide mononitrate (IMDUR) 30 MG 24 hr tablet Take 1 tablet (30 mg total) by mouth daily. 30 tablet 3   No current facility-administered medications for this visit.    Allergies:   Patient has no known allergies.    Social History:  The patient  reports that he has quit smoking. His smoking use included cigarettes. He has a 15.00 pack-year smoking history. He has never used smokeless tobacco. He reports that he does not drink alcohol or use drugs.   Family History:  The patient's family history includes Arrhythmia in his father and sister; Diabetes type II in his mother; Heart disease in his father.    ROS:  General:no colds or fevers, no weight changes Skin:no rashes or ulcers HEENT:no blurred vision, no congestion CV:see HPI PUL:see HPI GI:no diarrhea constipation or melena, no indigestion GU:no hematuria, no dysuria Neuro:no syncope, no lightheadedness   Wt Readings from Last 3 Encounters:  05/18/19 212 lb (96.2 kg)  03/24/19 210 lb 6.4 oz (95.4 kg)  02/22/19 213 lb 9.6 oz (96.9 kg)     PHYSICAL EXAM: VS:  BP 110/64   Pulse 70   Ht 6' 1.2" (1.859 m)   Wt 212 lb (96.2 kg)   BMI 27.82 kg/m  , BMI Body mass index is 27.82 kg/m. General:Pleasant affect, NAD Skin:Warm and dry, brisk capillary refill HEENT:normocephalic, sclera clear, mucus membranes moist Neck:supple, no JVD, no bruits   Heart:S1S2 RRR without murmur, gallup, rub or click Lungs:clear without rales, rhonchi, or wheezes [id number], non tender, + BS, do not palpate liver spleen or masses Rt groin with inguinal hernia  Not warm to touch. Ext:no lower ext edema, 2+ pedal pulses, 2+ radial pulses Neuro:alert and oriented X 3, MAE, follows commands, + facial symmetry    EKG:  EKG is ordered today. The ekg ordered today demonstrates SR with V pacing and PVC no acute changes   Recent Labs: 02/22/2019: ALT  23; BUN 15; Creatinine, Ser 1.34; Hemoglobin 14.9; Magnesium 1.9; Platelets 158; Potassium 4.2; Sodium 137    Lipid Panel    Component Value Date/Time   CHOL 147 02/22/2019 0902   TRIG 61 02/22/2019 0902   HDL 44 02/22/2019 0902   CHOLHDL 3.3 02/22/2019 0902   VLDL 12 02/22/2019 0902   LDLCALC 91 02/22/2019 0902       Other studies Reviewed: Additional studies/ records that were reviewed today include: . Echo 11/30/18 IMPRESSIONS    1. Left ventricular ejection fraction, by visual estimation, is <20%. The  left ventricle has severely decreased function. Severely increased left  ventricular size. There is no left ventricular hypertrophy.  2. Global right ventricle has normal systolic function.The right  ventricular size is normal. No increase in right ventricular wall  thickness.  3. Left atrial size was mild-moderately dilated.  4. Right atrial size was normal.  5. Moderate thickening of the mitral valve leaflet(s).  6. The mitral valve is abnormal. Mild mitral valve regurgitation.  7. The tricuspid valve is abnormal. Tricuspid valve regurgitation is  trivial.  8. The aortic valve is normal in structure. Aortic valve regurgitation is  mild by color flow Doppler.  9. The pulmonic valve was normal in structure. Pulmonic valve  regurgitation is trivial by color flow Doppler.  10. Normal pulmonary artery systolic pressure.  11. A pacer wire is visualized.  12. The atrial septum is  grossly normal.   FINDINGS  Left Ventricle: Left ventricular ejection fraction, by visual estimation,  is <20%. The left ventricle has severely decreased function. There is no  left ventricular hypertrophy. Severely increased left ventricular size.   Right Ventricle: The right ventricular size is normal. No increase in  right ventricular wall thickness. Global RV systolic function is has  normal systolic function. The tricuspid regurgitant velocity is 2.20 m/s,  and with an assumed right atrial pressure  of 8 mmHg, the estimated right ventricular systolic pressure is normal at  27.4 mmHg.   Left Atrium: Left atrial size was mild-moderately dilated.   Right Atrium: Right atrial size was normal in size   Pericardium: There is no evidence of pericardial effusion.   Mitral Valve: The mitral valve is abnormal. There is moderate thickening  of the mitral valve leaflet(s). There is mild calcification of the mitral  valve leaflet(s). Mild mitral valve regurgitation.   Tricuspid Valve: The tricuspid valve is abnormal. Tricuspid valve  regurgitation is trivial by color flow Doppler.   Aortic Valve: The aortic valve is normal in structure. Aortic valve  regurgitation is mild by color flow Doppler. Aortic regurgitation PHT  measures 524 msec.   Pulmonic Valve: The pulmonic valve was normal in structure. Pulmonic valve  regurgitation is trivial by color flow Doppler.   Aorta: The aortic root and ascending aorta are structurally normal, with  no evidence of dilitation.   IAS/Shunts: The atrial septum is grossly normal.   ASSESSMENT AND PLAN:  1.  Rt ing hernia, mild offered a truss but pt will wait and see general surgery , he has no PCP, he is not inclined to surgery but would like to hear options. Refer to general surgery reviewed last notes and labs.  2.  Chronic systolic HF and is euvolemic continue current meds  3.  PAF last eval mostly SR and SR today with V pacing with PPM/ICD  MDT on eliquis and no bleeding.  4.  VT asymptomatic and infreq episodes monitoring no dizziness or lightheadedness.  continue regular follow-ups     Current medicines are reviewed with the patient today.  The patient Has no concerns regarding medicines.  The following changes have been made:  See above Labs/ tests ordered today include:see above  Disposition:   FU:  see above  Signed, Cecilie Kicks, NP  05/18/2019 2:15 PM    [facility] [address], [place], [address] [place], [place] Phone: [phone]; Fax: [phone]

## 2019-05-18 NOTE — Addendum Note (Signed)
Addended by: Antonieta Iba on: 05/18/2019 03:55 PM   Modules accepted: Orders

## 2019-05-19 ENCOUNTER — Ambulatory Visit (INDEPENDENT_AMBULATORY_CARE_PROVIDER_SITE_OTHER): Payer: Medicare PPO | Admitting: *Deleted

## 2019-05-19 DIAGNOSIS — I5022 Chronic systolic (congestive) heart failure: Secondary | ICD-10-CM

## 2019-05-19 LAB — CUP PACEART REMOTE DEVICE CHECK
Battery Remaining Longevity: 27 mo
Battery Voltage: 2.94 V
Brady Statistic AP VP Percent: 5.54 %
Brady Statistic AP VS Percent: 0.02 %
Brady Statistic AS VP Percent: 93.39 %
Brady Statistic AS VS Percent: 1.06 %
Brady Statistic RA Percent Paced: 5.3 %
Brady Statistic RV Percent Paced: 98.48 %
Date Time Interrogation Session: 20210414043822
HighPow Impedance: 58 Ohm
Implantable Lead Implant Date: 20150209
Implantable Lead Implant Date: 20160919
Implantable Lead Implant Date: 20160919
Implantable Lead Location: 753858
Implantable Lead Location: 753859
Implantable Lead Location: 753860
Implantable Lead Model: 4598
Implantable Lead Model: 5076
Implantable Lead Model: 6935
Implantable Pulse Generator Implant Date: 20160919
Lead Channel Impedance Value: 266 Ohm
Lead Channel Impedance Value: 304 Ohm
Lead Channel Impedance Value: 304 Ohm
Lead Channel Impedance Value: 323 Ohm
Lead Channel Impedance Value: 323 Ohm
Lead Channel Impedance Value: 323 Ohm
Lead Channel Impedance Value: 380 Ohm
Lead Channel Impedance Value: 399 Ohm
Lead Channel Impedance Value: 456 Ohm
Lead Channel Impedance Value: 456 Ohm
Lead Channel Impedance Value: 494 Ohm
Lead Channel Impedance Value: 513 Ohm
Lead Channel Impedance Value: 513 Ohm
Lead Channel Pacing Threshold Amplitude: 0.875 V
Lead Channel Pacing Threshold Amplitude: 0.875 V
Lead Channel Pacing Threshold Amplitude: 2.5 V
Lead Channel Pacing Threshold Pulse Width: 0.4 ms
Lead Channel Pacing Threshold Pulse Width: 0.4 ms
Lead Channel Pacing Threshold Pulse Width: 0.4 ms
Lead Channel Sensing Intrinsic Amplitude: 14.25 mV
Lead Channel Sensing Intrinsic Amplitude: 14.625 mV
Lead Channel Sensing Intrinsic Amplitude: 3.25 mV
Lead Channel Sensing Intrinsic Amplitude: 3.25 mV
Lead Channel Setting Pacing Amplitude: 1.75 V
Lead Channel Setting Pacing Amplitude: 2 V
Lead Channel Setting Pacing Pulse Width: 0.4 ms
Lead Channel Setting Sensing Sensitivity: 0.3 mV

## 2019-05-19 NOTE — Progress Notes (Signed)
ICD Remote  

## 2019-05-21 ENCOUNTER — Other Ambulatory Visit (HOSPITAL_COMMUNITY): Payer: Self-pay | Admitting: Internal Medicine

## 2019-05-21 MED FILL — hydrALAZINE HCL 25 MG TABS: 25 | 90 days supply | Qty: 135 | Fill #0

## 2019-05-24 ENCOUNTER — Other Ambulatory Visit (HOSPITAL_COMMUNITY): Payer: Self-pay | Admitting: Cardiology

## 2019-05-24 MED FILL — ENTRESTO 24 MG-26 MG TABLET: 24-26 | 90 days supply | Qty: 180 | Fill #0

## 2019-05-24 MED FILL — ELIQUIS 5 MG TABLET: 5 | 30 days supply | Qty: 60 | Fill #1

## 2019-05-26 ENCOUNTER — Ambulatory Visit (HOSPITAL_COMMUNITY)
Admission: RE | Admit: 2019-05-26 | Discharge: 2019-05-26 | Disposition: A | Discharge status: Home / Self Care | Payer: Medicare PPO | Source: Ambulatory Visit | Attending: Cardiology | Admitting: Cardiology

## 2019-05-26 ENCOUNTER — Other Ambulatory Visit: Payer: Self-pay

## 2019-05-26 ENCOUNTER — Encounter (HOSPITAL_COMMUNITY): Payer: Self-pay | Admitting: Cardiology

## 2019-05-26 VITALS — BP 96/52 | HR 83 | Wt 209.6 lb

## 2019-05-26 DIAGNOSIS — E785 Hyperlipidemia, unspecified: Secondary | ICD-10-CM | POA: Diagnosis not present

## 2019-05-26 DIAGNOSIS — N189 Chronic kidney disease, unspecified: Secondary | ICD-10-CM | POA: Insufficient documentation

## 2019-05-26 DIAGNOSIS — Z7901 Long term (current) use of anticoagulants: Secondary | ICD-10-CM | POA: Insufficient documentation

## 2019-05-26 DIAGNOSIS — I4892 Unspecified atrial flutter: Secondary | ICD-10-CM | POA: Insufficient documentation

## 2019-05-26 DIAGNOSIS — Z79899 Other long term (current) drug therapy: Secondary | ICD-10-CM | POA: Diagnosis not present

## 2019-05-26 DIAGNOSIS — I5022 Chronic systolic (congestive) heart failure: Secondary | ICD-10-CM | POA: Diagnosis not present

## 2019-05-26 DIAGNOSIS — I428 Other cardiomyopathies: Secondary | ICD-10-CM | POA: Diagnosis not present

## 2019-05-26 DIAGNOSIS — Z86718 Personal history of other venous thrombosis and embolism: Secondary | ICD-10-CM | POA: Diagnosis not present

## 2019-05-26 DIAGNOSIS — Z8249 Family history of ischemic heart disease and other diseases of the circulatory system: Secondary | ICD-10-CM | POA: Diagnosis not present

## 2019-05-26 DIAGNOSIS — I2510 Atherosclerotic heart disease of native coronary artery without angina pectoris: Secondary | ICD-10-CM | POA: Insufficient documentation

## 2019-05-26 DIAGNOSIS — I480 Paroxysmal atrial fibrillation: Secondary | ICD-10-CM | POA: Insufficient documentation

## 2019-05-26 LAB — BASIC METABOLIC PANEL
Anion gap: 12 (ref 5–15)
BUN: 15 mg/dL (ref 8–23)
CO2: 24 mmol/L (ref 22–32)
Calcium: 9.1 mg/dL (ref 8.9–10.3)
Chloride: 102 mmol/L (ref 98–111)
Creatinine, Ser: 1.28 mg/dL — ABNORMAL HIGH (ref 0.61–1.24)
GFR calc Af Amer: >60 mL/min (ref >60)
GFR calc non Af Amer: 57 mL/min — ABNORMAL LOW (ref >60)
Glucose, Bld: 143 mg/dL — ABNORMAL HIGH (ref 70–99)
Potassium: 3.8 mmol/L (ref 3.5–5.1)
Sodium: 138 mmol/L (ref 135–145)

## 2019-05-26 LAB — DIGOXIN LEVEL: Digoxin Level: 0.7 ng/mL — ABNORMAL LOW (ref 0.8–2.0)

## 2019-05-26 NOTE — Progress Notes (Signed)
Patient ID: Philip Chavez, male   DOB: May 20, 1950, 69 y.o.   MRN: [id number] PCP: Dr. Gerlean Ren EP: Dr. Lovena Le HF cardiologist: Dr Aundra Dubin  69 y.o. with history of complete heart block/Medtronic PPM, cardiomyopathy, and atrial flutter s/p ablation presents for CHF clinic evaluation.  Patient developed complete heart block in 2/15 and had PPM placed at that time.  He paces his RV continuously.  In 2/15, EF was 50-55% by echo.  By 6/15, EF had fallen to 20-25%.  Cardiolite showed possible scar but no ischemia.  On 09/03/14, he was admitted with dyspnea and found to be in atrial flutter with RLL PNA and with volume overload.  Echo showed EF 15% with diffuse hypokinesis.  He was started on IV Lasix and IV amiodarone. He converted back to NSR.  He subsequently had atrial flutter ablation.  He had right and left heart cath in 9/16, showing nonobstructive CAD and optimized filling pressures with CI 2.09.  He had upgrade to CRT by Dr Lovena Le.  EF up to 30-35% in 4/17.  Subsequently, the LV lead dislodged.  He was offered lead revision, but preferred watchful waiting. Echo in 5/18 showed EF down to 20%.    Echo in 10/20 showed EF < 20%, normal RV.   [name] returns for followup of CHF today.  Symptomatically, he is still doing well though he is RV pacing 99% of the time on device interrogation. No significant exertional dyspnea.  No lightheadedness though BP low today at 96/52.  Able to do all ADLs without problems.  Taking care of his disabled wife.  No BRBPR/melena. Weight is down 4 lbs.   Labs (8/16): K 3.9, creatinine 1.03, AST 106, ALT 173, HCT 45.1, LDL 132 Labs (9/16): K 3.9, creatinine 1.16, HCT 42.5 Labs (10/16): K 3.9, creatinine 1.32, HCT 39.9 Labs (2/17): K 4.1, creatinine 1.26, BNP 107, digoxin 0.3 Labs (07/10/2015): K 3.9 Creatinine 1.23  Labs (1/18): K 4.1, creatinine 1.18, digoxin 0.3 Labs (2/18): K 4, creatinine 1.24, digoxin 0.4 Labs (5/18): K 4, creatinine 1.25, hgb 14.8, digoxin 0.5 Labs  (10/18): K 4, creatinine 1.24, digoxin 0.2 Labs (4/19): K 3.8, creatinine 1.22, digoxin 0.5, LDL 69, HDL 40 Labs (8/19): K 3.9, creatinine 1.35, digoxin 0.3 Labs (12/19): K 4, creatinine 1.22 Labs (1/21): K 4.2, creatinine 1.34, LDL 121  ECG (personally reviewed): NSR, RV-paced  Medtronic device interrogation: 2.5% atrial fibrillation, no VT, 99% RV paced, stable thoracic impedance.   PMH: 1. H/o complete heart block: Has Medtronic PPM, placed in 2/15.   2. Cardiomyopathy: Echo (2/15) prior to PPM placement with EF 50-55%.  Echo (6/15) with EF 20-25%,  Cardiolite at that time showed possible scar but no ischemia.  Echo (8/16) with EF 15%, diffuse hypokinesis, mildly dilated RV with normal systolic function, PA systolic pressure 69 mmHg.  LHC/RHC (9/16) with mean RA 4, PA 31/15 mean 20, mean PCWP 8, CI 2.09, 40% mLAD stenosis.  Upgrade to MDT CRT-D in 9/16.  - Echo (4/17) with EF 30-35%.   - LV lead dislodged and loss of BiV pacing.  - Echo (5/18) with EF 20%, mild LV dilation, normal RV size with mildly decreased systolic function, mild AI.  - Echo (10/20): EF < 20%, RV normal, mild MR.  3. CKD 4. 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  5. Atrial tachycardia: paroxysmal. 6. Atrial flutter: s/p ablation in 8/16.  7. OSA 8. Hyperlipidemia 9. Shingles with post-herpetic neuralgia.  10. Atrial fibrillation: Paroxysmal.   FH:  Father with CHF, complete heart block with PPM.  Sister with complete heart block and PPM.   SH: Retired Curator, nonsmoker, lives in [place]: All systems reviewed and negative except as per HPI  Current Outpatient Medications  Medication Sig Dispense Refill  . acetaminophen (TYLENOL) 325 MG tablet Take 1-2 tablets (325-650 mg total) by mouth every 4 (four) hours as needed for mild pain.    Marland Kitchen apixaban (ELIQUIS) 5 MG TABS tablet Take 1 tablet (5 mg total) by mouth 2 (two) times daily. 60 tablet 5  . bisoprolol (ZEBETA) 5 MG tablet TAKE 1 TABLET BY MOUTH  DAILY. 90 tablet 3  . digoxin (LANOXIN) 0.125 MG tablet Take 1 tablet By Mouth Daily. 30 tablet 3  . ENTRESTO 24-26 MG TAKE 1 TABLET BY MOUTH 2 (TWO) TIMES DAILY. 180 tablet 3  . furosemide (LASIX) 20 MG tablet TAKE 1 TABLET (20 MG TOTAL) 2 (TWO) TIMES DAILY BY MOUTH. 60 tablet 5  . hydrALAZINE (APRESOLINE) 25 MG tablet Take 0.5 tablets (12.5 mg total) by mouth 3 (three) times daily. 135 tablet 3  . isosorbide mononitrate (IMDUR) 30 MG 24 hr tablet Take 1 tablet (30 mg total) by mouth daily. 30 tablet 3  . simvastatin (ZOCOR) 40 MG tablet TAKE 1 TABLET BY MOUTH EVERY DAY 90 tablet 3  . spironolactone (ALDACTONE) 25 MG tablet TAKE 1 TABLET BY MOUTH DAILY. 90 tablet 3   No current facility-administered medications for this encounter.   BP (!) 96/52   Pulse 83   Wt 95.1 kg (209 lb 9.6 oz)   SpO2 97%   BMI 27.50 kg/m  General: NAD Neck: No JVD, no thyromegaly or thyroid nodule.  Lungs: Clear to auscultation bilaterally with normal respiratory effort. CV: Nondisplaced PMI.  Heart regular S1/S2, no S3/S4, no murmur.  No peripheral edema.  No carotid bruit.  Normal pedal pulses.  Abdomen: Soft, nontender, no hepatosplenomegaly, no distention.  Skin: Intact without lesions or rashes.  Neurologic: Alert and oriented x 3.  Psych: Normal affect. Extremities: No clubbing or cyanosis.  HEENT: Normal.   Assessment/Plan: 1. Chronic systolic CHF: Nonischemic cardiomyopathy.  EF 15% on echo in 8/16.  Patient had borderline reduced EF when PPM was placed in 2/15.  After that, EF fell significantly.  He has a history of complete heart block and father had CHF and CHB with pacemaker, and sounds like sister also had CHB with pacemaker. 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  He had CRT upgrade.  No significant CAD on last cath.  Echo in 4/17 showed improvement in EF to 30-35%.  However, his LV lead has dislodged and he is no longer BiV pacing, now RV pacing >  99% of the time.  Echo in 5/18 showed EF down to 20%, and echo in 10/20 showed EF < 20%. Symptomatically, he is still doing very well (NYHA class I-II) with no volume overload on exam or by Optivol.  SBP is too soft to titrate up meds today (as in the past), but I do not want him to stop any of his medication regimen.  - Continue bisoprolol 5 mg daily.  - Continue  Entresto 24/26 bid, spironolactone 25 daily, Lasix 20 bid, hydralazine 12.5 mg tid and Imdur 30 daily.  BMET today.   - Continue digoxin. Get digoxin level today.  - Echo in 10/20 showed EF reduced to < 20%, possibly due to loss of BiV pacing and chronic RV pacing.  He has minimal symptoms and has wanted to hold off on replacement of LV lead => if he worsens symptomatically, would strongly recommend replacement of LV lead.   - We have discussed genetic evaluation to look for LMNA or SCN5A mutations that could be used for familial screening, he has wanted to hold off.  2. Atrial flutter: s/p ablation.  3. Atrial fibrillation: Currently in NSR, has occasional AF by device interrogation but not often.  - Continue Eliquis, CBC today. 3. PE/DVT: >1 year ago and associated with surgery.   4. Hyperlipidemia: He has mild CAD.   - No ASA with Eliquis.   - Continue simvastatin 40 mg daily, good lipids when last checked.   5. Complete heart block: Now RV pacing with loss of LV lead, see discussion above.   Followup in 4 months.   Loralie Champagne 05/26/2019

## 2019-05-26 NOTE — Patient Instructions (Signed)
Continue your current medications  Labs done today, we will call you for abnormal results  Your physician recommends that you schedule a follow-up appointment in: 4 months  If you have any questions or concerns before your next appointment please send [place] a message through [place] or call our office at [phone].  At the [facility], you and your health needs are our priority. As part of our continuing mission to provide you with exceptional heart care, we have created designated Provider Care Teams. These Care Teams include your primary Cardiologist (physician) and Advanced Practice Providers (APPs- Physician Assistants and Nurse Practitioners) who all work together to provide you with the care you need, when you need it.   You may see any of the following providers on your designated Care Team at your next follow up: Marland Kitchen Dr Glori Bickers . Dr Loralie Champagne . Darrick Grinder, NP . Lyda Jester, PA . Audry Riles, PharmD   Please be sure to bring in all your medications bottles to every appointment.

## 2019-06-04 ENCOUNTER — Other Ambulatory Visit: Payer: Self-pay

## 2019-06-04 ENCOUNTER — Encounter: Payer: Self-pay | Admitting: Family Medicine

## 2019-06-04 ENCOUNTER — Ambulatory Visit (INDEPENDENT_AMBULATORY_CARE_PROVIDER_SITE_OTHER): Payer: Medicare PPO | Admitting: Family Medicine

## 2019-06-04 VITALS — BP 110/62 | HR 69 | Wt 212.0 lb

## 2019-06-04 DIAGNOSIS — B0229 Other postherpetic nervous system involvement: Secondary | ICD-10-CM

## 2019-06-04 DIAGNOSIS — R739 Hyperglycemia, unspecified: Secondary | ICD-10-CM

## 2019-06-04 DIAGNOSIS — N1831 Chronic kidney disease, stage 3a: Secondary | ICD-10-CM | POA: Diagnosis not present

## 2019-06-04 DIAGNOSIS — K4030 Unilateral inguinal hernia, with obstruction, without gangrene, not specified as recurrent: Secondary | ICD-10-CM

## 2019-06-04 DIAGNOSIS — E785 Hyperlipidemia, unspecified: Secondary | ICD-10-CM

## 2019-06-04 DIAGNOSIS — Z0000 Encounter for general adult medical examination without abnormal findings: Secondary | ICD-10-CM

## 2019-06-04 LAB — POCT GLYCOSYLATED HEMOGLOBIN (HGB A1C): HbA1c, POC (controlled diabetic range): 5.8 % (ref 0.0–7.0)

## 2019-06-04 NOTE — Progress Notes (Signed)
New Patient Visit  Subjective:  Patient ID: MRN [id number]  Date of birth: [birth year]  PCP: Philip Oliphant, MD  CC: Establish care    HPI Philip Chavez is a 69 y.o. male who presents today to establish care. He also complains of the following:  Re-establish care This is a 69 year old male who was last seen in [facility] clinic in May 2018 with Dr. Gerlean Chavez.  He is followed closely by cardiology.  CKD 3A  Patient was not aware of any chronic kidney disease. Denies any issues with urination at this time.   Hernia  Scheduled for surgery consult. Hx of hernia on left side several years ago. No bloody bowel movements, no abdominal pain. Has had history of left hernia surgery in the past.   Post herpetic Neuralgia Still has pruritis on left neck that extends to midline.   HISTORY  Reviewed with patient and updated in EMR as appropriate.  Allergies No Known Allergies  Current Outpatient Medications:  .  acetaminophen (TYLENOL) 325 MG tablet, Take 1-2 tablets (325-650 mg total) by mouth every 4 (four) hours as needed for mild pain., Disp: , Rfl:  .  apixaban (ELIQUIS) 5 MG TABS tablet, Take 1 tablet (5 mg total) by mouth 2 (two) times daily., Disp: 60 tablet, Rfl: 5 .  bisoprolol (ZEBETA) 5 MG tablet, TAKE 1 TABLET BY MOUTH DAILY., Disp: 90 tablet, Rfl: 3 .  digoxin (LANOXIN) 0.125 MG tablet, Take 1 tablet By Mouth Daily., Disp: 30 tablet, Rfl: 3 .  ENTRESTO 24-26 MG, TAKE 1 TABLET BY MOUTH 2 (TWO) TIMES DAILY., Disp: 180 tablet, Rfl: 3 .  furosemide (LASIX) 20 MG tablet, TAKE 1 TABLET (20 MG TOTAL) 2 (TWO) TIMES DAILY BY MOUTH., Disp: 60 tablet, Rfl: 5 .  hydrALAZINE (APRESOLINE) 25 MG tablet, Take 0.5 tablets (12.5 mg total) by mouth 3 (three) times daily., Disp: 135 tablet, Rfl: 3 .  isosorbide mononitrate (IMDUR) 30 MG 24 hr tablet, Take 1 tablet (30 mg total) by mouth daily., Disp: 30 tablet, Rfl: 3 .  simvastatin (ZOCOR) 40 MG tablet, TAKE 1 TABLET BY MOUTH EVERY DAY, Disp: 90 tablet,  Rfl: 3 .  spironolactone (ALDACTONE) 25 MG tablet, TAKE 1 TABLET BY MOUTH DAILY., Disp: 90 tablet, Rfl: 3  Past Medical History:  Past Medical History:  Diagnosis Date  . AICD (automatic cardioverter/defibrillator) present 0919/2016   BIV   . Atrial flutter ([place])    a. 08/2014 presented w/ aflutter->converted on amio;  b. CHA2DS2VASc = 2 -->Xarelto.  . Atrial tachycardia ([place])   . Chronic combined systolic and diastolic CHF (congestive heart failure) ([place])    a. 03/2013 Echo EF 50-55%;  b. 07/2013 Echo EF 20-25%, diff HK, Gr3 DD;  c. 08/2014 Echo: EF 15%, diff HK, mild AI/MR, mildly dil LA/RV, mod TR, PASP 1mmHg.  Marland Kitchen DVT (deep venous thrombosis) ([place])    a. 2007 RLE: S/P ankle surgery;  b. 03/2013 LLE DVT & PE-->Xarelto.  . 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 2007 RLE: S/P ankle surgery;  b. 03/2013 LLE DVT & PE-->Xarelto.  . Hypercholesterolemia   . Hypercoagulable state ([place])   . Hypertension   . Musculoskeletal neck pain   . Nonischemic cardiomyopathy ([place])    a. 07/2013 Echo: EF 20-25%;  b. 07/2013 Myoview: Large inferior, lateral, apical scar w/ HK, no ischemia, EF 17%;  b. 08/2014 Echo: EF 15%, diff HK.  Marland Kitchen Pneumonia ~ 04/2014; 09/02/2014  . Presence of permanent cardiac pacemaker  a. 03/2013 s/p MDT [place], ser # [id number] H.  . Sleep apnea    does not wear mask (09/02/2014)  . Third degree heart block ([place])    a. 03/2013 s/p MDT ADDRL1 [place] PPM, ser # [id number].   Past Surgical History:  Procedure Laterality Date  . CARDIAC CATHETERIZATION N/A 10/17/2014   Procedure: Right/Left Heart Cath and Coronary Angiography;  Surgeon: Larey Dresser, MD;  Location: [place] [facility];  Service: Cardiovascular;  Laterality: N/A;  . ELECTROPHYSIOLOGIC STUDY N/A 09/07/2014   Procedure: A-Flutter;  Surgeon: Evans Lance, MD;  Location: [place] [facility];  Service: Cardiovascular;  Laterality: N/A;  . EP IMPLANTABLE DEVICE  10/24/2014   BIV  . EP IMPLANTABLE DEVICE  N/A 10/24/2014   Procedure: BiV ICD Upgrade;  Surgeon: Evans Lance, MD;  Location: [place] [facility];  Service: Cardiovascular;  Laterality: N/A;  . FOOT FRACTURE SURGERY Right 2007  . FRACTURE SURGERY    . INGUINAL HERNIA REPAIR Right 1980's  . INSERT / REPLACE / REMOVE PACEMAKER     MDT ADDRL1 pacemaker implanted by Dr Lovena Le for complete heart block  . PERMANENT PACEMAKER INSERTION N/A 03/15/2013   Procedure: PERMANENT PACEMAKER INSERTION;  Surgeon: Evans Lance, MD;  Location: [facility];  Service: Cardiovascular;  Laterality: N/A;    Health Maintenance:   Health Maintenance  Topic Date Due  . Flu Shot  09/05/2019  . Pneumonia vaccines (2 of 2 - PPSV23) 11/26/2019  . Tetanus Vaccine  04/14/2023  . Colon Cancer Screening  12/23/2023  . COVID-19 Vaccine  Completed  .  Hepatitis C: One time screening is recommended by Center for Disease Control  (CDC) for  adults born from [birth year] through 1965.   Completed   Eye doctor: [place] History Philip Chavez's family history includes Arrhythmia in his father and sister; Diabetes type II in his mother; Heart disease in his father. There is no history of Hypertension, Heart attack, or Stroke.   Social History  Philip Chavez's  reports that he has quit smoking. His smoking use included cigarettes. He has a 15.00 pack-year smoking history. He has never used smokeless tobacco. He reports that he does not drink alcohol or use drugs..   Objective:  Physical Exam:  BP 110/62   Pulse 69   Wt 212 lb (96.2 kg)   SpO2 97%   BMI 27.82 kg/m   Gen: NAD, alert, non-toxic, pleasant, fit HEENT: Normocephaic, atraumatic. PERRLA, clear conjuctiva, no scleral icterus and injection. Normal EOM.  Hearing intact. TM pearly grey bilaterally with no fluid. Neck supple with no LAD, nodules, or gross abnormality.  Nares patent with no discharge.  Oropharynx without erythema and lesions.  Tonsils nonswollen and without exudate.   CV: Regular rate and rhythm.   Normal S1-S2.  No murmur, gallops, S3, S4 appreciated.  Normal capillary refill bilaterally.  Radial pulses 2+ bilaterally. No bilateral lower extremity edema. Resp: Clear to auscultation bilaterally.  No wheezing, rales, rhonchi, or other abnormal lung sounds.  No increased work of breathing appreciated. Abd: Nontender and nondistended on palpation to all 4 quadrants.  Positive bowel sounds. Groin: large, ~ 5 cm wide reducible hernia appreciated in right pelvic area. + BS on auscultation. No extension into scrotum.  Skin: No obvious rashes, lesions, or trauma.  Normal turgor.  MSK: Normal ROM. Normal strength and tone.  Neuro: Cranial nerves II through VI grossly intact. Gait normal.  Alert and oriented  x4.  No obvious abnormal movements. Psych: Cooperative with exam.  Normal speech. Pleasant. Makes good eye contact. Genitourinary: deferred.   Pertinent Labs & Imaging:  Cr. 1.28 Assessment & Plan:  Dyslipidemia Currently on simvastatin 40 mg daily.  Most recent lipid panel 4 months ago was within normal limits.  We will continue to monitor annually.  Inguinal hernia of right side with obstruction Patient with reducible hernia on physical exam today.  Patient has had previous hernia injury and does have a surgical consult lined up, but not yet scheduled.  Urged patient to make the appointment sooner than later.  ED precautions provided.  Annual physical exam 69 year old male with past medical history significant for cardiovascular disease, closely followed by cardiology, presents for annual visit and to reestablish care at [facility].  Patient is overall well-appearing, fit.  He is up-to-date on all of his health maintenance. Lab abnormalities include elevated creatinine (See CKD).  He has no other concerns except for right-sided hernia today.  Patient can return to care as needed or in 1 year for annual physical.  CKD (chronic kidney disease) stage 3, GFR 30-59 ml/min Discussed during visit.   Patient was unaware of any previous chronic kidney disease.  Explained to patient that kidneys to decline over time, though, it is likely in his case that cardiovascular disease may have added to some of the kidney disease.  Explained to patient that he should avoid certain medications such as ibuprofen for pain.  He does not have severe disease and does not need referral to [place] [facility] at this time.  Patient expressed understanding.  Patient seen with cardiology very often and does get multiple chemistries throughout the year.  We will continue to follow these chemistries.  Otherwise, plan on checking about every 6 months.  Patient would benefit from ACE or ARB.   - Will obtain UPC at next visit.   Philip Chavez, M.D.  2:50 PM 06/09/2019

## 2019-06-04 NOTE — Patient Instructions (Signed)
Dear Philip Chavez,   It was good to see you! Thank you for taking your time to come in to be seen. Today, we discussed the following:   - It is super important that you make it to your surgical consult. If you have any abdominal pain, bloody bowel movements, please go straight to the emergency department.   I will call you about your A1C (a measure for diabetes) as it has been elevated in 2016. That result will determine our next follow up.   Please bring all of your medications to your next appointment.   Be well,   Zettie Cooley, M.D   [facility] [phone]  *Sign up for MyChart for instant access to your health profile, labs, orders, upcoming appointments or to contact your provider with questions*  ===================================================================================

## 2019-06-07 ENCOUNTER — Telehealth: Payer: Self-pay

## 2019-06-07 NOTE — Telephone Encounter (Signed)
Returned patient call.  He wanted assistance in getting his wife a new PCP and wanted her to be seen today.  Advised to check with his insurance company regarding doctors within her network or ask his PCP if she can be a new patient at that office. Advised if wife is having urgent symptoms to either go to Urgent care or ER if needed.  He will check with his PCP regarding an appt for his wife.

## 2019-06-09 DIAGNOSIS — N183 Chronic kidney disease, stage 3 unspecified: Secondary | ICD-10-CM | POA: Insufficient documentation

## 2019-06-09 DIAGNOSIS — Z0000 Encounter for general adult medical examination without abnormal findings: Secondary | ICD-10-CM

## 2019-06-09 DIAGNOSIS — K4030 Unilateral inguinal hernia, with obstruction, without gangrene, not specified as recurrent: Secondary | ICD-10-CM | POA: Insufficient documentation

## 2019-06-09 HISTORY — DX: Encounter for general adult medical examination without abnormal findings: Z00.00

## 2019-06-09 NOTE — Assessment & Plan Note (Signed)
Patient reports irritation in the area that is transient.  He reports that it is never gone away.  We will continue to manage conservatively.  If patient with any worsening, he should come back to the office to be seen.

## 2019-06-09 NOTE — Assessment & Plan Note (Signed)
Discussed during visit.  Patient was unaware of any previous chronic kidney disease.  Explained to patient that kidneys to decline over time, though, it is likely in his case that cardiovascular disease may have added to some of the kidney disease.  Explained to patient that he should avoid certain medications such as ibuprofen for pain.  He does not have severe disease and does not need referral to [place] [facility] at this time.  Patient expressed understanding.  Patient seen with cardiology very often and does get multiple chemistries throughout the year.  We will continue to follow these chemistries.  Otherwise, plan on checking about every 6 months.  Patient would benefit from ACE or ARB.   - Will obtain UPC at next visit.

## 2019-06-09 NOTE — Assessment & Plan Note (Addendum)
69 year old male with past medical history significant for cardiovascular disease, closely followed by cardiology, presents for annual visit and to reestablish care at [facility].  Patient is overall well-appearing, fit.  He is up-to-date on all of his health maintenance. Lab abnormalities include elevated creatinine (See CKD).  He has no other concerns except for right-sided hernia today.  Patient can return to care as needed or in 1 year for annual physical.

## 2019-06-09 NOTE — Assessment & Plan Note (Signed)
Patient with reducible hernia on physical exam today.  Patient has had previous hernia injury and does have a surgical consult lined up, but not yet scheduled.  Urged patient to make the appointment sooner than later.  ED precautions provided.

## 2019-06-09 NOTE — Assessment & Plan Note (Signed)
Currently on simvastatin 40 mg daily.  Most recent lipid panel 4 months ago was within normal limits.  We will continue to monitor annually.

## 2019-06-15 ENCOUNTER — Other Ambulatory Visit (HOSPITAL_COMMUNITY): Payer: Self-pay

## 2019-06-15 ENCOUNTER — Ambulatory Visit (INDEPENDENT_AMBULATORY_CARE_PROVIDER_SITE_OTHER): Payer: Medicare PPO

## 2019-06-15 ENCOUNTER — Other Ambulatory Visit: Payer: Self-pay | Admitting: Internal Medicine

## 2019-06-15 DIAGNOSIS — Z95810 Presence of automatic (implantable) cardiac defibrillator: Secondary | ICD-10-CM | POA: Diagnosis not present

## 2019-06-15 DIAGNOSIS — I5022 Chronic systolic (congestive) heart failure: Secondary | ICD-10-CM | POA: Diagnosis not present

## 2019-06-15 MED ORDER — DIGOXIN 125 MCG PO TABS: ORAL_TABLET | ORAL | 7 refills | Status: DC

## 2019-06-17 ENCOUNTER — Other Ambulatory Visit: Payer: Self-pay | Admitting: Cardiology

## 2019-06-18 NOTE — Progress Notes (Signed)
EPIC Encounter for ICM Monitoring  Patient Name: Philip Chavez is a 69 y.o. male Date: 06/18/2019 Primary Care Physican: Wilber Oliphant, MD Primary [place] Electrophysiologist: Florinda Marker Pacing:99.2% 4/21/2021Office Weight:209lbs  Clinical Status (26-May-2019 to 15-Jun-2019)  VT-NS (>4 beats, >162 bpm) 3   AT/AF 4 episodes  Time in AT/AF 1.4 hr/day (5.7%)  Longest AT/AF 21 hours     Transmission reviewed.  [place] with Dr Aundra Dubin 05/26/2019 and per note his LV lead has dislodged and he is no longer BiV pacing.  Prescribed:Furosemide 20 mg 1 tablet twice a day.  Labs: 05/26/2019 Creatinine 1.28, BUN 15, Potassium 3.8, Sodium 138, GFR 57->60 02/22/2019 Creatinine 1.34, BUN 15, Potassium 4.2, Sodium 137, GFR 54->60 A complete set of results can be found in Results Review.  Recommendations: No changes and encouraged to call if experiencing any fluid symptoms.   Follow-up plan: ICM clinic phone appointment on6/15/2021. 91 day device clinic remote transmission 08/18/2019.   Copy of ICM check sent to Dr.Taylor.  3 month ICM trend: 06/15/2019    1 Year ICM trend:       Rosalene Billings, RN 06/18/2019 8:46 AM

## 2019-06-24 MED FILL — ELIQUIS 5 MG TABLET: 5 | 30 days supply | Qty: 60 | Fill #2

## 2019-06-30 ENCOUNTER — Other Ambulatory Visit (HOSPITAL_COMMUNITY): Payer: Self-pay | Admitting: Internal Medicine

## 2019-06-30 DIAGNOSIS — I5022 Chronic systolic (congestive) heart failure: Secondary | ICD-10-CM

## 2019-07-19 MED FILL — BISOPROLOL FUMARATE 5 MG TA: 5 | 90 days supply | Qty: 90 | Fill #1

## 2019-07-20 ENCOUNTER — Ambulatory Visit (INDEPENDENT_AMBULATORY_CARE_PROVIDER_SITE_OTHER): Payer: Medicare PPO

## 2019-07-20 DIAGNOSIS — Z95810 Presence of automatic (implantable) cardiac defibrillator: Secondary | ICD-10-CM

## 2019-07-20 DIAGNOSIS — I5022 Chronic systolic (congestive) heart failure: Secondary | ICD-10-CM | POA: Diagnosis not present

## 2019-07-23 MED FILL — ELIQUIS 5 MG TABLET: 5 | 30 days supply | Qty: 60 | Fill #3

## 2019-07-23 NOTE — Progress Notes (Signed)
EPIC Encounter for ICM Monitoring  Patient Name: Philip Chavez is a 69 y.o. male Date: 07/23/2019 Primary Care Physican: Wilber Oliphant, MD Primary [place] Electrophysiologist: Lovena Le RV Pacing:99.2% Last Weight:209lbs  Clinical Status (15-Jun-2019 to 20-Jul-2019)  Treated VT/VF 0 episodes   AT/AF             7 episodes   Time in AT/AF 0.6 hr/day (2.6%)   Observations (1) (15-Jun-2019 to 20-Jul-2019)  AT/AF >= 6 hr for 2 days      Spoke with patient and reports feeling well at this time.  Denies fluid symptoms.    [place] with Dr Aundra Dubin 05/26/2019 and per note his LV lead has dislodged and he is no longer BiV pacing.  Prescribed:Furosemide 20 mg 1 tablet twice a day.  Labs: 05/26/2019 Creatinine 1.28, BUN 15, Potassium 3.8, Sodium 138, GFR 57->60 02/22/2019 Creatinine 1.34, BUN 15, Potassium 4.2, Sodium 137, GFR 54->60 A complete set of results can be found in Results Review.  Recommendations: No changes and encouraged to call if experiencing any fluid symptoms.  Follow-up plan: ICM clinic phone appointment on7/16/2021. 91 day device clinic remote transmission 08/18/2019.   Copy of ICM check sent to Dr.Taylor.  3 month ICM trend: 07/20/2019    1 Year ICM trend:       Rosalene Billings, RN 07/23/2019 2:08 PM

## 2019-07-31 MED FILL — SPIRONOLACTONE 25 MG TABS: 25 | 90 days supply | Qty: 90 | Fill #1

## 2019-08-15 MED FILL — DIGOXIN 0.125 MG TABLET: 125 | 30 days supply | Qty: 30 | Fill #2

## 2019-08-18 ENCOUNTER — Ambulatory Visit (INDEPENDENT_AMBULATORY_CARE_PROVIDER_SITE_OTHER): Payer: Medicare PPO | Admitting: *Deleted

## 2019-08-18 DIAGNOSIS — I5022 Chronic systolic (congestive) heart failure: Secondary | ICD-10-CM

## 2019-08-18 LAB — CUP PACEART REMOTE DEVICE CHECK
Battery Remaining Longevity: 26 mo
Battery Voltage: 2.93 V
Brady Statistic AP VP Percent: 3.8 %
Brady Statistic AP VS Percent: 0 %
Brady Statistic AS VP Percent: 95.44 %
Brady Statistic AS VS Percent: 0.76 %
Brady Statistic RA Percent Paced: 3.69 %
Brady Statistic RV Percent Paced: 98.8 %
Date Time Interrogation Session: 20210714033424
HighPow Impedance: 57 Ohm
Implantable Lead Implant Date: 20150209
Implantable Lead Implant Date: 20160919
Implantable Lead Implant Date: 20160919
Implantable Lead Location: 753858
Implantable Lead Location: 753859
Implantable Lead Location: 753860
Implantable Lead Model: 4598
Implantable Lead Model: 5076
Implantable Lead Model: 6935
Implantable Pulse Generator Implant Date: 20160919
Lead Channel Impedance Value: 266 Ohm
Lead Channel Impedance Value: 304 Ohm
Lead Channel Impedance Value: 304 Ohm
Lead Channel Impedance Value: 323 Ohm
Lead Channel Impedance Value: 323 Ohm
Lead Channel Impedance Value: 342 Ohm
Lead Channel Impedance Value: 380 Ohm
Lead Channel Impedance Value: 437 Ohm
Lead Channel Impedance Value: 437 Ohm
Lead Channel Impedance Value: 456 Ohm
Lead Channel Impedance Value: 494 Ohm
Lead Channel Impedance Value: 494 Ohm
Lead Channel Impedance Value: 513 Ohm
Lead Channel Pacing Threshold Amplitude: 0.875 V
Lead Channel Pacing Threshold Amplitude: 0.875 V
Lead Channel Pacing Threshold Amplitude: 2.5 V
Lead Channel Pacing Threshold Pulse Width: 0.4 ms
Lead Channel Pacing Threshold Pulse Width: 0.4 ms
Lead Channel Pacing Threshold Pulse Width: 0.4 ms
Lead Channel Sensing Intrinsic Amplitude: 14.375 mV
Lead Channel Sensing Intrinsic Amplitude: 14.375 mV
Lead Channel Sensing Intrinsic Amplitude: 3.375 mV
Lead Channel Sensing Intrinsic Amplitude: 3.375 mV
Lead Channel Setting Pacing Amplitude: 2 V
Lead Channel Setting Pacing Amplitude: 2 V
Lead Channel Setting Pacing Pulse Width: 0.4 ms
Lead Channel Setting Sensing Sensitivity: 0.3 mV

## 2019-08-19 NOTE — Progress Notes (Signed)
Remote ICD transmission.   

## 2019-08-20 ENCOUNTER — Ambulatory Visit (INDEPENDENT_AMBULATORY_CARE_PROVIDER_SITE_OTHER): Payer: Medicare PPO

## 2019-08-20 DIAGNOSIS — Z95810 Presence of automatic (implantable) cardiac defibrillator: Secondary | ICD-10-CM

## 2019-08-20 DIAGNOSIS — I5022 Chronic systolic (congestive) heart failure: Secondary | ICD-10-CM | POA: Diagnosis not present

## 2019-08-23 NOTE — Progress Notes (Signed)
EPIC Encounter for ICM Monitoring  Patient Name: Philip Chavez is a 69 y.o. male Date: 08/23/2019 Primary Care Physican: Wilber Oliphant, MD Primary Cardiologist:[place] Electrophysiologist: Lovena Le RV Pacing:98.8% LastWeight:209lbs  Clinical Status (20-Jul-2019 to 18-Aug-2019) AT/AF 2 Time in AT/AF 0.5 hr/day (2.2%) Longest AT/AF 12 hours       Transmission reviewed.  [place] with Dr McLean4/21/2021and per note his LV lead has dislodged and he is no longer BiV pacing.  Prescribed:Furosemide 20 mg 1 tablet twice a day.  Labs: 05/26/2019 Creatinine 1.28, BUN 15, Potassium 3.8, Sodium 138, GFR 57->60 02/22/2019 Creatinine 1.34, BUN 15, Potassium 4.2, Sodium 137, GFR 54->60 A complete set of results can be found in Results Review.  Recommendations: No changes.  Follow-up plan: ICM clinic phone appointment on8/19/2021. 91 day device clinic remote transmission10/13/2021.   EP/Cardiology Office Visits: 10/01/2019 with Dr. Aundra Dubin.    Copy of ICM check sent to Dr. Lovena Le.   3 month ICM trend: 08/18/2019    1 Year ICM trend:       Rosalene Billings, RN 08/23/2019 12:31 PM

## 2019-08-24 MED FILL — ELIQUIS 5 MG TABLET: 5 | 30 days supply | Qty: 60 | Fill #4

## 2019-08-24 MED FILL — ENTRESTO 24 MG-26 MG TABLET: 24-26 | 90 days supply | Qty: 180 | Fill #1

## 2019-08-25 MED FILL — hydrALAZINE HCL 25 MG TABS: 25 | 90 days supply | Qty: 135 | Fill #1

## 2019-09-14 MED FILL — DIGOXIN 0.125 MG TABLET: 125 | 30 days supply | Qty: 30 | Fill #3

## 2019-09-23 ENCOUNTER — Telehealth: Payer: Self-pay

## 2019-09-23 NOTE — Telephone Encounter (Signed)
I let the pt know we did not receive his ICM transmission. He agreed to send one today.

## 2019-09-24 ENCOUNTER — Ambulatory Visit (INDEPENDENT_AMBULATORY_CARE_PROVIDER_SITE_OTHER): Payer: Medicare PPO

## 2019-09-24 DIAGNOSIS — I5022 Chronic systolic (congestive) heart failure: Secondary | ICD-10-CM

## 2019-09-24 DIAGNOSIS — Z95810 Presence of automatic (implantable) cardiac defibrillator: Secondary | ICD-10-CM

## 2019-09-25 MED FILL — ELIQUIS 5 MG TABLET: 5 | 30 days supply | Qty: 60 | Fill #5

## 2019-09-27 ENCOUNTER — Telehealth: Payer: Self-pay

## 2019-09-27 NOTE — Telephone Encounter (Signed)
Remote ICM transmission received.  Attempted call to patient regarding ICM remote transmission and left detailed message per DPR.  Advised to return call for any fluid symptoms or questions. Next ICM remote transmission scheduled 10/25/2019.     

## 2019-09-27 NOTE — Progress Notes (Signed)
EPIC Encounter for ICM Monitoring  Patient Name: Philip Chavez is a 69 y.o. male Date: 09/27/2019 Primary Care Physican: Wilber Oliphant, MD Primary Cardiologist:[place] Electrophysiologist: Lovena Le RV Pacing:98.9% LastWeight:209lbs  Clinical Status (23-Sep-2019 to 24-Sep-2019) AT/AF 0 episodes  Time in AT/AF (<0.1%)       Attempted call to patient and unable to reach.  Left detailed message per DPR regarding transmission. Transmission reviewed.   [place] with Dr McLean4/21/2021and per note his LV lead has dislodged and he is no longer BiV pacing.  Prescribed:Furosemide 20 mg 1 tablet twice a day.  Labs: 05/26/2019 Creatinine 1.28, BUN 15, Potassium 3.8, Sodium 138, GFR 57->60 02/22/2019 Creatinine 1.34, BUN 15, Potassium 4.2, Sodium 137, GFR 54->60 A complete set of results can be found in Results Review.  Recommendations: Left voice mail with ICM number and encouraged to call if experiencing any fluid symptoms.  Follow-up plan: ICM clinic phone appointment on9/20/2021. 91 day device clinic remote transmission10/13/2021.   EP/Cardiology Office Visits: 10/01/2019 with Dr. Aundra Dubin.    Copy of ICM check sent to Dr. Lovena Le.    3 month ICM trend: 09/24/2019    1 Year ICM trend:       Rosalene Billings, RN 09/27/2019 11:50 AM

## 2019-10-01 ENCOUNTER — Encounter (HOSPITAL_COMMUNITY): Payer: Medicare PPO | Admitting: Cardiology

## 2019-10-07 ENCOUNTER — Telehealth (HOSPITAL_COMMUNITY): Payer: Self-pay

## 2019-10-07 NOTE — Telephone Encounter (Signed)
Patient called to schedule a 39m fu appointment with Dr. Hillis Range scheduled for 9/3 @ 9am. Patient was very appreciative.

## 2019-10-08 ENCOUNTER — Encounter (HOSPITAL_COMMUNITY): Payer: Self-pay | Admitting: Cardiology

## 2019-10-08 ENCOUNTER — Ambulatory Visit (HOSPITAL_COMMUNITY)
Admission: RE | Admit: 2019-10-08 | Discharge: 2019-10-08 | Disposition: A | Discharge status: Home / Self Care | Payer: Medicare PPO | Source: Ambulatory Visit | Attending: Cardiology | Admitting: Cardiology

## 2019-10-08 ENCOUNTER — Other Ambulatory Visit: Payer: Self-pay

## 2019-10-08 VITALS — BP 110/64 | HR 72 | Ht 72.0 in | Wt 208.0 lb

## 2019-10-08 DIAGNOSIS — Z86718 Personal history of other venous thrombosis and embolism: Secondary | ICD-10-CM | POA: Insufficient documentation

## 2019-10-08 DIAGNOSIS — Z7901 Long term (current) use of anticoagulants: Secondary | ICD-10-CM | POA: Diagnosis not present

## 2019-10-08 DIAGNOSIS — I4892 Unspecified atrial flutter: Secondary | ICD-10-CM | POA: Insufficient documentation

## 2019-10-08 DIAGNOSIS — N189 Chronic kidney disease, unspecified: Secondary | ICD-10-CM | POA: Diagnosis not present

## 2019-10-08 DIAGNOSIS — E785 Hyperlipidemia, unspecified: Secondary | ICD-10-CM | POA: Diagnosis not present

## 2019-10-08 DIAGNOSIS — I442 Atrioventricular block, complete: Secondary | ICD-10-CM | POA: Diagnosis not present

## 2019-10-08 DIAGNOSIS — I5042 Chronic combined systolic (congestive) and diastolic (congestive) heart failure: Secondary | ICD-10-CM

## 2019-10-08 DIAGNOSIS — Z8249 Family history of ischemic heart disease and other diseases of the circulatory system: Secondary | ICD-10-CM | POA: Diagnosis not present

## 2019-10-08 DIAGNOSIS — I428 Other cardiomyopathies: Secondary | ICD-10-CM | POA: Diagnosis not present

## 2019-10-08 DIAGNOSIS — I2510 Atherosclerotic heart disease of native coronary artery without angina pectoris: Secondary | ICD-10-CM | POA: Insufficient documentation

## 2019-10-08 DIAGNOSIS — I480 Paroxysmal atrial fibrillation: Secondary | ICD-10-CM

## 2019-10-08 DIAGNOSIS — Z79899 Other long term (current) drug therapy: Secondary | ICD-10-CM | POA: Diagnosis not present

## 2019-10-08 LAB — CBC
HCT: 45.6 % (ref 39.0–52.0)
Hemoglobin: 15 g/dL (ref 13.0–17.0)
MCH: 31.6 pg (ref 26.0–34.0)
MCHC: 32.9 g/dL (ref 30.0–36.0)
MCV: 96 fL (ref 80.0–100.0)
Platelets: 166 10*3/uL (ref 150–400)
RBC: 4.75 MIL/uL (ref 4.22–5.81)
RDW: 13.7 % (ref 11.5–15.5)
WBC: 4.5 10*3/uL (ref 4.0–10.5)
nRBC: 0 % (ref 0.0–0.2)

## 2019-10-08 LAB — BASIC METABOLIC PANEL
Anion gap: 10 (ref 5–15)
BUN: 13 mg/dL (ref 8–23)
CO2: 23 mmol/L (ref 22–32)
Calcium: 9 mg/dL (ref 8.9–10.3)
Chloride: 102 mmol/L (ref 98–111)
Creatinine, Ser: 1.22 mg/dL (ref 0.61–1.24)
GFR calc Af Amer: >60 mL/min (ref >60)
GFR calc non Af Amer: >60 mL/min (ref >60)
Glucose, Bld: 115 mg/dL — ABNORMAL HIGH (ref 70–99)
Potassium: 3.8 mmol/L (ref 3.5–5.1)
Sodium: 135 mmol/L (ref 135–145)

## 2019-10-08 LAB — DIGOXIN LEVEL: Digoxin Level: 0.2 ng/mL — ABNORMAL LOW (ref 0.8–2.0)

## 2019-10-08 NOTE — Patient Instructions (Signed)
No medication changes today!  Labs today We will only contact you if something comes back abnormal or we need to make some changes. Otherwise no news is good news!  Your physician has requested that you have an echocardiogram. Echocardiography is a painless test that uses sound waves to create images of your heart. It provides your doctor with information about the size and shape of your heart and how well your heart's chambers and valves are working. This procedure takes approximately one hour. There are no restrictions for this procedure.   Your physician recommends that you schedule a follow-up appointment in: 4 months with Dr Aundra Dubin and an ECHO   Please call office at [phone] option 2 if you have any questions or concerns.   At the [facility], you and your health needs are our priority. As part of our continuing mission to provide you with exceptional heart care, we have created designated Provider Care Teams. These Care Teams include your primary Cardiologist (physician) and Advanced Practice Providers (APPs- Physician Assistants and Nurse Practitioners) who all work together to provide you with the care you need, when you need it.   You may see any of the following providers on your designated Care Team at your next follow up: Marland Kitchen Dr Glori Bickers . Dr Loralie Champagne . Darrick Grinder, NP . Lyda Jester, PA . Audry Riles, PharmD   Please be sure to bring in all your medications bottles to every appointment.

## 2019-10-09 NOTE — Progress Notes (Signed)
Patient ID: Philip Chavez, male   DOB: January 17, 1951, 69 y.o.   MRN: [id number] PCP: Dr. Gerlean Ren EP: Dr. Lovena Le HF cardiologist: Dr Aundra Dubin  69 y.o. with history of complete heart block/Medtronic PPM, cardiomyopathy, and atrial flutter s/p ablation presents for CHF clinic evaluation.  Patient developed complete heart block in 2/15 and had PPM placed at that time.  He paces his RV continuously.  In 2/15, EF was 50-55% by echo.  By 6/15, EF had fallen to 20-25%.  Cardiolite showed possible scar but no ischemia.  On 09/03/14, he was admitted with dyspnea and found to be in atrial flutter with RLL PNA and with volume overload.  Echo showed EF 15% with diffuse hypokinesis.  He was started on IV Lasix and IV amiodarone. He converted back to NSR.  He subsequently had atrial flutter ablation.  He had right and left heart cath in 9/16, showing nonobstructive CAD and optimized filling pressures with CI 2.09.  He had upgrade to CRT by Dr Lovena Le.  EF up to 30-35% in 4/17.  Subsequently, the LV lead dislodged.  He was offered lead revision, but preferred watchful waiting. Echo in 5/18 showed EF down to 20%.    Echo in 10/20 showed EF < 20%, normal RV.   Mr Philip Chavez returns for followup of CHF today.  Symptomatically, he is still doing well though he is RV pacing 99% of the time on device interrogation. No significant exertional dyspnea.  No chest pain.  No orthopnea/PND.  No palpitations. No BRBPR/melena.   Labs (8/16): K 3.9, creatinine 1.03, AST 106, ALT 173, HCT 45.1, LDL 132 Labs (9/16): K 3.9, creatinine 1.16, HCT 42.5 Labs (10/16): K 3.9, creatinine 1.32, HCT 39.9 Labs (2/17): K 4.1, creatinine 1.26, BNP 107, digoxin 0.3 Labs (07/10/2015): K 3.9 Creatinine 1.23  Labs (1/18): K 4.1, creatinine 1.18, digoxin 0.3 Labs (2/18): K 4, creatinine 1.24, digoxin 0.4 Labs (5/18): K 4, creatinine 1.25, hgb 14.8, digoxin 0.5 Labs (10/18): K 4, creatinine 1.24, digoxin 0.2 Labs (4/19): K 3.8, creatinine 1.22, digoxin 0.5, LDL  69, HDL 40 Labs (8/19): K 3.9, creatinine 1.35, digoxin 0.3 Labs (12/19): K 4, creatinine 1.22 Labs (1/21): K 4.2, creatinine 1.34, LDL 121 Labs (4/21): digoxin level 0.7, K 3.8, creatinine 1.28  Medtronic device interrogation: 2% atrial fibrillation, RV pacing 99% of the time.   PMH: 1. H/o complete heart block: Has Medtronic PPM, placed in 2/15.   2. Cardiomyopathy: Echo (2/15) prior to PPM placement with EF 50-55%.  Echo (6/15) with EF 20-25%,  Cardiolite at that time showed possible scar but no ischemia.  Echo (8/16) with EF 15%, diffuse hypokinesis, mildly dilated RV with normal systolic function, PA systolic pressure 69 mmHg.  LHC/RHC (9/16) with mean RA 4, PA 31/15 mean 20, mean PCWP 8, CI 2.09, 40% mLAD stenosis.  Upgrade to MDT CRT-D in 9/16.  - Echo (4/17) with EF 30-35%.   - LV lead dislodged and loss of BiV pacing.  - Echo (5/18) with EF 20%, mild LV dilation, normal RV size with mildly decreased systolic function, mild AI.  - Echo (10/20): EF < 20%, RV normal, mild MR.  3. CKD 4. 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  5. Atrial tachycardia: paroxysmal. 6. Atrial flutter: s/p ablation in 8/16.  7. OSA 8. Hyperlipidemia 9. Shingles with post-herpetic neuralgia.  10. Atrial fibrillation: Paroxysmal.   FH: Father with CHF, complete heart block with PPM.  Sister with complete heart block and PPM.   SH:  Retired Curator, nonsmoker, lives in [place]  ROS: All systems reviewed and negative except as per HPI  Current Outpatient Medications  Medication Sig Dispense Refill  . acetaminophen (TYLENOL) 325 MG tablet Take 1-2 tablets (325-650 mg total) by mouth every 4 (four) hours as needed for mild pain.    Marland Kitchen apixaban (ELIQUIS) 5 MG TABS tablet Take 1 tablet (5 mg total) by mouth 2 (two) times daily. 60 tablet 5  . bisoprolol (ZEBETA) 5 MG tablet TAKE 1 TABLET BY MOUTH DAILY. 90 tablet 3  . digoxin (LANOXIN) 0.125 MG tablet Take 1 tablet By Mouth Daily. 30 tablet 7  . ENTRESTO  24-26 MG TAKE 1 TABLET BY MOUTH 2 (TWO) TIMES DAILY. 180 tablet 3  . furosemide (LASIX) 20 MG tablet TAKE 1 TABLET (20 MG TOTAL) 2 (TWO) TIMES DAILY BY MOUTH. 180 tablet 3  . hydrALAZINE (APRESOLINE) 25 MG tablet Take 0.5 tablets (12.5 mg total) by mouth 3 (three) times daily. 135 tablet 3  . simvastatin (ZOCOR) 40 MG tablet TAKE 1 TABLET BY MOUTH EVERY DAY 90 tablet 3  . spironolactone (ALDACTONE) 25 MG tablet TAKE 1 TABLET BY MOUTH DAILY. 90 tablet 3  . isosorbide mononitrate (IMDUR) 30 MG 24 hr tablet Take 1 tablet (30 mg total) by mouth daily. 30 tablet 3   No current facility-administered medications for this encounter.   BP 110/64   Pulse 72   Ht 6' (1.829 m)   Wt 94.3 kg (208 lb)   SpO2 98%   BMI 28.21 kg/m  General: NAD Neck: No JVD, no thyromegaly or thyroid nodule.  Lungs: Clear to auscultation bilaterally with normal respiratory effort. CV: Nondisplaced PMI.  Heart regular S1/S2, no S3/S4, no murmur.  No peripheral edema.  No carotid bruit.  Normal pedal pulses.  Abdomen: Soft, nontender, no hepatosplenomegaly, no distention.  Skin: Intact without lesions or rashes.  Neurologic: Alert and oriented x 3.  Psych: Normal affect. Extremities: No clubbing or cyanosis.  HEENT: Normal.   Assessment/Plan: 1. Chronic systolic CHF: Nonischemic cardiomyopathy.  EF 15% on echo in 8/16.  Patient had borderline reduced EF when PPM was placed in 2/15.  After that, EF fell significantly.  He has a history of complete heart block and father had CHF and CHB with pacemaker, and sounds like sister also had CHB with pacemaker. 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  He had CRT upgrade.  No significant CAD on last cath.  Echo in 4/17 showed improvement in EF to 30-35%.  However, his LV lead has dislodged and he is no longer BiV pacing, now RV pacing > 99% of the time.  Echo in 5/18 showed EF down to 20%, and echo in 10/20 showed EF < 20%.  Symptomatically, he is still doing very well (NYHA class I-II) with no volume overload on exam or by Optivol.  - Continue bisoprolol 5 mg daily.  - Continue  Entresto 24/26 bid, spironolactone 25 daily, Lasix 20 bid, hydralazine 12.5 mg tid and Imdur 30 daily.  BMET today.   - Continue digoxin. Get digoxin level today.  - Echo in 10/20 showed EF reduced to < 20%, possibly due to loss of BiV pacing and chronic RV pacing.  He has minimal symptoms and has wanted to hold off on replacement of LV lead => if he worsens symptomatically, would strongly recommend replacement of LV lead.   - We have discussed genetic evaluation to look for LMNA or SCN5A mutations  that could be used for familial screening, he has wanted to hold off.  - Repeat echo at followup in 4 months.  2. Atrial flutter: s/p ablation.  3. Atrial fibrillation: Currently in NSR, has occasional AF by device interrogation but not often.  - Continue Eliquis, CBC today. 3. PE/DVT: >1 year ago and associated with surgery.   4. Hyperlipidemia: He has mild CAD.   - No ASA with Eliquis.   - Continue simvastatin 40 mg daily, good lipids when last checked.   5. Complete heart block: Now RV pacing with loss of LV lead, see discussion above.   Followup in 4 months with echo.   Loralie Champagne 10/09/2019

## 2019-10-13 ENCOUNTER — Other Ambulatory Visit (HOSPITAL_COMMUNITY): Payer: Self-pay

## 2019-10-13 MED ORDER — DIGOXIN 125 MCG PO TABS: ORAL_TABLET | ORAL | 2 refills | Status: DC

## 2019-10-13 MED FILL — DIGOXIN 0.125 MG TABLET: 125 | 30 days supply | Qty: 30 | Fill #0

## 2019-10-19 MED FILL — BISOPROLOL FUMARATE 5 MG TA: 5 | 90 days supply | Qty: 90 | Fill #2

## 2019-10-25 ENCOUNTER — Ambulatory Visit (INDEPENDENT_AMBULATORY_CARE_PROVIDER_SITE_OTHER): Payer: Medicare PPO

## 2019-10-25 DIAGNOSIS — Z95810 Presence of automatic (implantable) cardiac defibrillator: Secondary | ICD-10-CM | POA: Diagnosis not present

## 2019-10-25 DIAGNOSIS — I5042 Chronic combined systolic (congestive) and diastolic (congestive) heart failure: Secondary | ICD-10-CM

## 2019-10-28 ENCOUNTER — Other Ambulatory Visit (HOSPITAL_COMMUNITY): Payer: Self-pay | Admitting: Cardiology

## 2019-10-28 MED FILL — ELIQUIS 5 MG TABLET: 5 | 30 days supply | Qty: 60 | Fill #0

## 2019-10-29 NOTE — Progress Notes (Signed)
EPIC Encounter for ICM Monitoring  Patient Name: Philip Chavez is a 69 y.o. male Date: 10/29/2019 Primary Care Physican: Wilber Oliphant, MD Primary [place] Electrophysiologist: Lovena Le RV Pacing:98.6% 9/24/2021Weight:209lbs  Clinical Status (08-Oct-2019 to 25-Oct-2019) Time in AT/AF <0.1 hr/day (<0.1%) Longest AT/AF 10 seconds    Spoke with patient and reports feeling well at this time.  Denies fluid symptoms.    New Alluwe with Dr McLean4/21/2021and per note his LV lead has dislodged and he is no longer BiV pacing.  Prescribed:Furosemide 20 mg 1 tablet twice a day.  Labs: 05/26/2019 Creatinine 1.28, BUN 15, Potassium 3.8, Sodium 138, GFR 57->60 02/22/2019 Creatinine 1.34, BUN 15, Potassium 4.2, Sodium 137, GFR 54->60 A complete set of results can be found in Results Review.  Recommendations:No changes and encouraged to call if experiencing any fluid symptoms.  Follow-up plan: ICM clinic phone appointment on10/25/2021. 91 day device clinic remote transmission10/13/2021.   EP/Cardiology Office Visits:02/02/2020 with Dr.McLean.   Copy of ICM check sent to Dr.Taylor.    3 month ICM trend: 10/25/2019    1 Year ICM trend:       Rosalene Billings, RN 10/29/2019 8:24 AM

## 2019-10-30 MED FILL — SPIRONOLACTONE 25 MG TABS: 25 | 90 days supply | Qty: 90 | Fill #2

## 2019-11-15 ENCOUNTER — Ambulatory Visit: Payer: Medicare PPO | Attending: Internal Medicine

## 2019-11-15 DIAGNOSIS — Z23 Encounter for immunization: Secondary | ICD-10-CM

## 2019-11-15 MED FILL — DIGOXIN 0.125 MG TABLET: 125 | 30 days supply | Qty: 30 | Fill #1

## 2019-11-15 NOTE — Progress Notes (Signed)
   Covid-19 Vaccination Clinic  Name:  Arne Schlender    MRN: [id number] DOB: 18-Nov-1950  11/15/2019  Mr. Apfel was observed post Covid-19 immunization for 15 minutes without incident. He was provided with Vaccine Information Sheet and instruction to access the V-Safe system.   Mr. Trebilcock was instructed to call 911 with any severe reactions post vaccine: Marland Kitchen Difficulty breathing  . Swelling of face and throat  . A fast heartbeat  . A bad rash all over body  . Dizziness and weakness

## 2019-11-16 DIAGNOSIS — Z23 Encounter for immunization: Secondary | ICD-10-CM | POA: Diagnosis not present

## 2019-11-17 ENCOUNTER — Ambulatory Visit (INDEPENDENT_AMBULATORY_CARE_PROVIDER_SITE_OTHER): Payer: Medicare PPO

## 2019-11-17 DIAGNOSIS — I442 Atrioventricular block, complete: Secondary | ICD-10-CM

## 2019-11-17 LAB — CUP PACEART REMOTE DEVICE CHECK
Battery Remaining Longevity: 24 mo
Battery Voltage: 2.91 V
Brady Statistic AP VP Percent: 9.31 %
Brady Statistic AP VS Percent: 0.04 %
Brady Statistic AS VP Percent: 89.63 %
Brady Statistic AS VS Percent: 1.03 %
Brady Statistic RA Percent Paced: 8.62 %
Brady Statistic RV Percent Paced: 98.16 %
Date Time Interrogation Session: 20211013074228
HighPow Impedance: 61 Ohm
Implantable Lead Implant Date: 20150209
Implantable Lead Implant Date: 20160919
Implantable Lead Implant Date: 20160919
Implantable Lead Location: 753858
Implantable Lead Location: 753859
Implantable Lead Location: 753860
Implantable Lead Model: 4598
Implantable Lead Model: 5076
Implantable Lead Model: 6935
Implantable Pulse Generator Implant Date: 20160919
Lead Channel Impedance Value: 266 Ohm
Lead Channel Impedance Value: 304 Ohm
Lead Channel Impedance Value: 304 Ohm
Lead Channel Impedance Value: 323 Ohm
Lead Channel Impedance Value: 342 Ohm
Lead Channel Impedance Value: 380 Ohm
Lead Channel Impedance Value: 380 Ohm
Lead Channel Impedance Value: 399 Ohm
Lead Channel Impedance Value: 456 Ohm
Lead Channel Impedance Value: 494 Ohm
Lead Channel Impedance Value: 494 Ohm
Lead Channel Impedance Value: 513 Ohm
Lead Channel Impedance Value: 532 Ohm
Lead Channel Pacing Threshold Amplitude: 0.875 V
Lead Channel Pacing Threshold Amplitude: 0.875 V
Lead Channel Pacing Threshold Amplitude: 2.5 V
Lead Channel Pacing Threshold Pulse Width: 0.4 ms
Lead Channel Pacing Threshold Pulse Width: 0.4 ms
Lead Channel Pacing Threshold Pulse Width: 0.4 ms
Lead Channel Sensing Intrinsic Amplitude: 11 mV
Lead Channel Sensing Intrinsic Amplitude: 11 mV
Lead Channel Sensing Intrinsic Amplitude: 3.375 mV
Lead Channel Sensing Intrinsic Amplitude: 3.375 mV
Lead Channel Setting Pacing Amplitude: 1.75 V
Lead Channel Setting Pacing Amplitude: 2 V
Lead Channel Setting Pacing Pulse Width: 0.4 ms
Lead Channel Setting Sensing Sensitivity: 0.3 mV

## 2019-11-19 ENCOUNTER — Other Ambulatory Visit (HOSPITAL_COMMUNITY): Payer: Self-pay | Admitting: Urology

## 2019-11-19 DIAGNOSIS — N410 Acute prostatitis: Secondary | ICD-10-CM | POA: Diagnosis not present

## 2019-11-19 MED FILL — MELOXICAM 15 MG TABLET: 15 | 14 days supply | Qty: 14 | Fill #0

## 2019-11-19 MED FILL — TAMSULOSIN HCL 0.4 MG CAP: 0.4 | 30 days supply | Qty: 30 | Fill #0

## 2019-11-22 NOTE — Progress Notes (Signed)
Remote ICD transmission.   

## 2019-11-26 MED FILL — ELIQUIS 5 MG TABLET: 5 | 30 days supply | Qty: 60 | Fill #1

## 2019-11-26 MED FILL — ENTRESTO 24 MG-26 MG TABLET: 24-26 | 30 days supply | Qty: 60 | Fill #2

## 2019-11-27 MED FILL — HYDRALAZINE HCL 25 MG TABS: 25 | 90 days supply | Qty: 135 | Fill #2

## 2019-11-29 ENCOUNTER — Ambulatory Visit (INDEPENDENT_AMBULATORY_CARE_PROVIDER_SITE_OTHER): Payer: Medicare PPO

## 2019-11-29 DIAGNOSIS — I5042 Chronic combined systolic (congestive) and diastolic (congestive) heart failure: Secondary | ICD-10-CM

## 2019-11-29 DIAGNOSIS — Z95810 Presence of automatic (implantable) cardiac defibrillator: Secondary | ICD-10-CM

## 2019-11-29 NOTE — Progress Notes (Signed)
EPIC Encounter for ICM Monitoring  Patient Name: Philip Chavez is a 69 y.o. male Date: 11/29/2019 Primary Care Physican: Wilber Oliphant, MD Primary [place] Electrophysiologist: Lovena Le RV Pacing:98.5% 9/24/2021Weight:209lbs  VT-NS (>4 beats, >162 bpm) 2 Time in AT/AF  <0.1 hr/day (<0.1%)    Spoke with patient and reports feeling well at this time.  Denies fluid symptoms.    OptiVolThoracic impedancesuggesting possible fluid accumulation starting 11/21/2019. OV with Dr McLean4/21/2021and per note his LV lead has dislodged and he is no longer BiV pacing.  Prescribed:Furosemide 20 mg 1 tablet twice a day.  Labs: 10/08/2019 Creatinine 1.22, BUN 13, Potassium 3.8, Sodium 135, GFR >60 05/26/2019 Creatinine 1.28, BUN 15, Potassium 3.8, Sodium 138, GFR 57->60 02/22/2019 Creatinine 1.34, BUN 15, Potassium 4.2, Sodium 137, GFR 54->60 A complete set of results can be found in Results Review.  Recommendations:Recommendation to limit salt intake.  Advised will call back if Dr Aundra Dubin has any recommendations.   Follow-up plan: ICM clinic phone appointment on11/02/2019 to recheck fluid levels. 91 day device clinic remote transmission1/01/2021.   EP/Cardiology Office Visits:02/02/2020 with Dr.McLean.   Copy of ICM check sent to Dr.Taylor and Dr Aundra Dubin for review and recommendations if needed.  3 month ICM trend: 11/29/2019    1 Year ICM trend:       Rosalene Billings, RN 11/29/2019 10:52 AM

## 2019-12-03 NOTE — Progress Notes (Signed)
He can take lasix 40 qam/20 qpm x 3 days then back to 20 mg bid.

## 2019-12-03 NOTE — Progress Notes (Signed)
Spoke with patient and advised of Dr Claris Gladden recommendation to take Furosemide 40 mg AM/20 mg PM x 3 days. He declined to take the extra dosage and would prefer to check report on Monday.  He said if he Mondays report still shows fluid then he will take extra Furosemide dosage as recommended.

## 2019-12-06 ENCOUNTER — Ambulatory Visit (INDEPENDENT_AMBULATORY_CARE_PROVIDER_SITE_OTHER): Payer: Medicare PPO

## 2019-12-06 DIAGNOSIS — I5042 Chronic combined systolic (congestive) and diastolic (congestive) heart failure: Secondary | ICD-10-CM

## 2019-12-06 DIAGNOSIS — Z95810 Presence of automatic (implantable) cardiac defibrillator: Secondary | ICD-10-CM

## 2019-12-07 NOTE — Progress Notes (Signed)
EPIC Encounter for ICM Monitoring  Patient Name: Philip Chavez is a 69 y.o. male Date: 12/07/2019 Primary Care Physican: Wilber Oliphant, MD Primary [place] Electrophysiologist: Lovena Le RV Pacing:98.5% 9/24/2021Weight:209lbs  VT-NS (>4 beats, >162 bpm)    2 Time in AT/AF<0.1 hr/day (<0.1%)    Spoke with patient and reports feeling well at this time.  Denies fluid symptoms.      OptiVolThoracic impedancereturned to normal after declining Dr Claris Gladden recommendation to take Furosemide 40 mg x 3 days. OV with Dr McLean4/21/2021and per note his LV lead has dislodged and he is no longer BiV pacing.  Prescribed:Furosemide 20 mg 1 tablet twice a day.  Labs: 10/08/2019 Creatinine 1.22, BUN 13, Potassium 3.8, Sodium 135, GFR >60 05/26/2019 Creatinine 1.28, BUN 15, Potassium 3.8, Sodium 138, GFR 57->60 02/22/2019 Creatinine 1.34, BUN 15, Potassium 4.2, Sodium 137, GFR 54->60 A complete set of results can be found in Results Review.  Recommendations:No changes and encouraged to call if experiencing any fluid symptoms.  Follow-up plan: ICM clinic phone appointment on11/30/2021. 91 day device clinic remote transmission1/01/2021.   EP/Cardiology Office Visits:02/02/2020 with Dr.McLean.   Copy of ICM check sent to Dr.Taylor..   3 month ICM trend: 12/06/2019    1 Year ICM trend:       Rosalene Billings, RN 12/07/2019 12:36 PM

## 2019-12-15 MED FILL — DIGOXIN 0.125 MG TABLET: 125 | 30 days supply | Qty: 30 | Fill #2

## 2019-12-28 MED FILL — ENTRESTO 24 MG-26 MG TABLET: 24-26 | 30 days supply | Qty: 60 | Fill #3

## 2019-12-29 MED FILL — ELIQUIS 5 MG TABLET: 5 | 30 days supply | Qty: 60 | Fill #2

## 2020-01-04 ENCOUNTER — Ambulatory Visit (INDEPENDENT_AMBULATORY_CARE_PROVIDER_SITE_OTHER): Payer: Medicare PPO

## 2020-01-04 DIAGNOSIS — I5042 Chronic combined systolic (congestive) and diastolic (congestive) heart failure: Secondary | ICD-10-CM | POA: Diagnosis not present

## 2020-01-04 DIAGNOSIS — Z95810 Presence of automatic (implantable) cardiac defibrillator: Secondary | ICD-10-CM | POA: Diagnosis not present

## 2020-01-07 NOTE — Progress Notes (Signed)
EPIC Encounter for ICM Monitoring  Patient Name: Philip Chavez is a 69 y.o. male Date: 01/07/2020 Primary Care Physican: Wilber Oliphant, MD Primary [place] Electrophysiologist: Lovena Le RV Pacing:98.6% 9/24/2021Weight:209lbs  VT-NS (>4 beats, >162 bpm)3 Time in AT/AF    0.9 hr/day (3.6%) Longest AT/AF   17 hours   Transmission reviewed.     OptiVolThoracic impedancesuggesting normal fluid levels.  Prescribed:Furosemide 20 mg 1 tablet twice a day.  Labs: 10/08/2019 Creatinine 1.22, BUN 13, Potassium 3.8, Sodium 135, GFR >60 05/26/2019 Creatinine 1.28, BUN 15, Potassium 3.8, Sodium 138, GFR 57->60 02/22/2019 Creatinine 1.34, BUN 15, Potassium 4.2, Sodium 137, GFR 54->60 A complete set of results can be found in Results Review.  Recommendations:No changes.  Follow-up plan: ICM clinic phone appointment on1/04/2020. 91 day device clinic remote transmission1/01/2021.   EP/Cardiology Office Visits:02/02/2020 with Dr.McLean.   Copy of ICM check sent to Dr.Taylor.   3 month ICM trend: 01/04/2020    1 Year ICM trend:       Rosalene Billings, RN 01/07/2020 3:28 PM

## 2020-01-16 MED FILL — DIGOXIN 0.125 MG TABLET: 125 | 30 days supply | Qty: 30 | Fill #3

## 2020-01-18 MED FILL — BISOPROLOL FUMARATE 5 MG TA: 5 | 90 days supply | Qty: 90 | Fill #3

## 2020-01-27 MED FILL — ENTRESTO 24 MG-26 MG TABLET: 24-26 | 30 days supply | Qty: 60 | Fill #4

## 2020-01-27 MED FILL — ELIQUIS 5 MG TABLET: 5 | 30 days supply | Qty: 60 | Fill #3

## 2020-01-28 MED FILL — SPIRONOLACTONE 25 MG TABS: 25 | 90 days supply | Qty: 90 | Fill #3

## 2020-02-02 ENCOUNTER — Encounter (HOSPITAL_COMMUNITY): Payer: Self-pay | Admitting: Cardiology

## 2020-02-02 ENCOUNTER — Ambulatory Visit (HOSPITAL_COMMUNITY)
Admission: RE | Admit: 2020-02-02 | Discharge: 2020-02-02 | Disposition: A | Discharge status: Home / Self Care | Payer: Medicare PPO | Source: Ambulatory Visit | Attending: Cardiology | Admitting: Cardiology

## 2020-02-02 ENCOUNTER — Other Ambulatory Visit: Payer: Self-pay

## 2020-02-02 ENCOUNTER — Other Ambulatory Visit (HOSPITAL_COMMUNITY): Payer: Self-pay | Admitting: Cardiology

## 2020-02-02 VITALS — BP 104/60 | HR 72 | Wt 212.0 lb

## 2020-02-02 DIAGNOSIS — Z95810 Presence of automatic (implantable) cardiac defibrillator: Secondary | ICD-10-CM | POA: Insufficient documentation

## 2020-02-02 DIAGNOSIS — Z86711 Personal history of pulmonary embolism: Secondary | ICD-10-CM | POA: Insufficient documentation

## 2020-02-02 DIAGNOSIS — I480 Paroxysmal atrial fibrillation: Secondary | ICD-10-CM | POA: Diagnosis not present

## 2020-02-02 DIAGNOSIS — I428 Other cardiomyopathies: Secondary | ICD-10-CM | POA: Insufficient documentation

## 2020-02-02 DIAGNOSIS — Z79899 Other long term (current) drug therapy: Secondary | ICD-10-CM | POA: Insufficient documentation

## 2020-02-02 DIAGNOSIS — I5042 Chronic combined systolic (congestive) and diastolic (congestive) heart failure: Secondary | ICD-10-CM

## 2020-02-02 DIAGNOSIS — Z8249 Family history of ischemic heart disease and other diseases of the circulatory system: Secondary | ICD-10-CM | POA: Insufficient documentation

## 2020-02-02 DIAGNOSIS — Z7901 Long term (current) use of anticoagulants: Secondary | ICD-10-CM | POA: Insufficient documentation

## 2020-02-02 DIAGNOSIS — I2510 Atherosclerotic heart disease of native coronary artery without angina pectoris: Secondary | ICD-10-CM | POA: Diagnosis not present

## 2020-02-02 DIAGNOSIS — Z86718 Personal history of other venous thrombosis and embolism: Secondary | ICD-10-CM | POA: Diagnosis not present

## 2020-02-02 DIAGNOSIS — I4892 Unspecified atrial flutter: Secondary | ICD-10-CM | POA: Insufficient documentation

## 2020-02-02 DIAGNOSIS — N189 Chronic kidney disease, unspecified: Secondary | ICD-10-CM | POA: Diagnosis not present

## 2020-02-02 DIAGNOSIS — I442 Atrioventricular block, complete: Secondary | ICD-10-CM | POA: Diagnosis not present

## 2020-02-02 DIAGNOSIS — E785 Hyperlipidemia, unspecified: Secondary | ICD-10-CM | POA: Diagnosis not present

## 2020-02-02 LAB — CBC
HCT: 46.3 % (ref 39.0–52.0)
Hemoglobin: 15.2 g/dL (ref 13.0–17.0)
MCH: 31.3 pg (ref 26.0–34.0)
MCHC: 32.8 g/dL (ref 30.0–36.0)
MCV: 95.5 fL (ref 80.0–100.0)
Platelets: 164 10*3/uL (ref 150–400)
RBC: 4.85 MIL/uL (ref 4.22–5.81)
RDW: 14.1 % (ref 11.5–15.5)
WBC: 5.2 10*3/uL (ref 4.0–10.5)
nRBC: 0 % (ref 0.0–0.2)

## 2020-02-02 LAB — ECHOCARDIOGRAM COMPLETE
AR max vel: 3.45 cm2
AV Area VTI: 3.01 cm2
AV Area mean vel: 3.37 cm2
AV Mean grad: 1.5 mmHg
AV Peak grad: 3.1 mmHg
Ao pk vel: 0.89 m/s
P 1/2 time: 1529 msec
S' Lateral: 6 cm

## 2020-02-02 LAB — BASIC METABOLIC PANEL
Anion gap: 7 (ref 5–15)
BUN: 12 mg/dL (ref 8–23)
CO2: 27 mmol/L (ref 22–32)
Calcium: 9 mg/dL (ref 8.9–10.3)
Chloride: 104 mmol/L (ref 98–111)
Creatinine, Ser: 1.24 mg/dL (ref 0.61–1.24)
GFR, Estimated: >60 mL/min (ref >60)
Glucose, Bld: 123 mg/dL — ABNORMAL HIGH (ref 70–99)
Potassium: 4.1 mmol/L (ref 3.5–5.1)
Sodium: 138 mmol/L (ref 135–145)

## 2020-02-02 LAB — DIGOXIN LEVEL: Digoxin Level: 0.5 ng/mL — ABNORMAL LOW (ref 0.8–2.0)

## 2020-02-02 MED ORDER — DAPAGLIFLOZIN PROPANEDIOL 10 MG PO TABS: 10.0000 mg | ORAL_TABLET | Freq: Every day | ORAL | 11 refills | Status: DC

## 2020-02-02 NOTE — Progress Notes (Signed)
  Echocardiogram 2D Echocardiogram has been performed.  Philip Chavez 02/02/2020, 10:11 AM

## 2020-02-02 NOTE — Patient Instructions (Addendum)
Labs done today. We will contact you only if your labs are abnormal.  START Farxiga 10mg  (1 tablet) by mouth daily.  No other medication changes were made. Please continue all other medications as prescribed.   Your physician recommends that you schedule a follow-up appointment in:  Lab in 10 days and an appointment with Dr. in 3 months  Please contact Dr. Shirlee Latch office to schedule an appointment for possible device upgrade [phone].   If you have any questions or concerns before your next appointment please send [phone] a message through [place] or call our office at [phone].    TO LEAVE A MESSAGE FOR THE NURSE SELECT OPTION 2, PLEASE LEAVE A MESSAGE INCLUDING: . YOUR NAME . DATE OF BIRTH . CALL BACK NUMBER . REASON FOR CALL**this is important as we prioritize the call backs  YOU WILL RECEIVE A CALL BACK THE SAME DAY AS LONG AS YOU CALL BEFORE 4:00 PM   Do the following things EVERYDAY: 1) Weigh yourself in the morning before breakfast. Write it down and keep it in a log. 2) Take your medicines as prescribed 3) Eat low salt foods--Limit salt (sodium) to 2000 mg per day.  4) Stay as active as you can everyday 5) Limit all fluids for the day to less than 2 liters  At [facility], you and your health needs are our priority. As part of our continuing mission to provide you with exceptional heart care, we have created designated Provider Care Teams. These Care Teams include your primary Cardiologist (physician) and Advanced Practice Providers (APPs- Physician Assistants and Nurse Practitioners) who all work together to provide you with the care you need, when you need it.   You may see any of the following providers on your designated Care Team at your next follow up: [phone] Dr Marland Kitchen . Dr Arvilla Meres . Marca Ancona, NP . Tonye Becket, PA . Robbie Lis, PharmD   Please be sure to bring in all your medications bottles to every appointment.

## 2020-02-02 NOTE — Progress Notes (Signed)
Patient ID: Philip Chavez, male   DOB: 1950-04-09, 69 y.o.   MRN: [id number] PCP: Dr. Gerlean Ren EP: Dr. Lovena Le HF cardiologist: Dr Aundra Dubin  69 y.o. with history of complete heart block/Medtronic PPM, cardiomyopathy, and atrial flutter s/p ablation presents for CHF clinic evaluation.  Patient developed complete heart block in 2/15 and had PPM placed at that time.  He paces his RV continuously.  In 2/15, EF was 50-55% by echo.  By 6/15, EF had fallen to 20-25%.  Cardiolite showed possible scar but no ischemia.  On 09/03/14, he was admitted with dyspnea and found to be in atrial flutter with RLL PNA and with volume overload.  Echo showed EF 15% with diffuse hypokinesis.  He was started on IV Lasix and IV amiodarone. He converted back to NSR.  He subsequently had atrial flutter ablation.  He had right and left heart cath in 9/16, showing nonobstructive CAD and optimized filling pressures with CI 2.09.  He had upgrade to CRT by Dr Lovena Le.  EF up to 30-35% in 4/17.  Subsequently, the LV lead dislodged.  He was offered lead revision, but preferred watchful waiting. Echo in 5/18 showed EF down to 20%.    Echo in 10/20 showed EF < 20%, normal RV.  Echo was done today and reviewed, EF <20%, moderate LV dilation with mild LVH, mildly decreased RV systolic function.   Philip Chavez returns for followup of CHF today.  Symptomatically, he is still doing well though he is RV pacing 99% of the time on device interrogation. Dyspnea only with heavy exertion.  Does ok with stairs.  No lightheadedness.  No orthopnea/PND. No chest pain.    Labs (8/16): K 3.9, creatinine 1.03, AST 106, ALT 173, HCT 45.1, LDL 132 Labs (9/16): K 3.9, creatinine 1.16, HCT 42.5 Labs (10/16): K 3.9, creatinine 1.32, HCT 39.9 Labs (2/17): K 4.1, creatinine 1.26, BNP 107, digoxin 0.3 Labs (07/10/2015): K 3.9 Creatinine 1.23  Labs (1/18): K 4.1, creatinine 1.18, digoxin 0.3 Labs (2/18): K 4, creatinine 1.24, digoxin 0.4 Labs (5/18): K 4, creatinine 1.25,  hgb 14.8, digoxin 0.5 Labs (10/18): K 4, creatinine 1.24, digoxin 0.2 Labs (4/19): K 3.8, creatinine 1.22, digoxin 0.5, LDL 69, HDL 40 Labs (8/19): K 3.9, creatinine 1.35, digoxin 0.3 Labs (12/19): K 4, creatinine 1.22 Labs (1/21): K 4.2, creatinine 1.34, LDL 121 Labs (4/21): digoxin level 0.7, K 3.8, creatinine 1.28 Labs (9/21): digoxin 0.2, hgb 15, K 3.8, creatinine 1.22  Medtronic device interrogation: rare atrial fibrillation, RV pacing 99.5% of the time, stable thoracic impedance.   ECG (personally reviewed): NSR, RV paced with QRS 188 msec  PMH: 1. H/o complete heart block: Has Medtronic PPM, placed in 2/15.   2. Cardiomyopathy: Echo (2/15) prior to PPM placement with EF 50-55%.  Echo (6/15) with EF 20-25%,  Cardiolite at that time showed possible scar but no ischemia.  Echo (8/16) with EF 15%, diffuse hypokinesis, mildly dilated RV with normal systolic function, PA systolic pressure 69 mmHg.  LHC/RHC (9/16) with mean RA 4, PA 31/15 mean 20, mean PCWP 8, CI 2.09, 40% mLAD stenosis.  Upgrade to MDT CRT-D in 9/16.  - Echo (4/17) with EF 30-35%.   - LV lead dislodged and loss of BiV pacing.  - Echo (5/18) with EF 20%, mild LV dilation, normal RV size with mildly decreased systolic function, mild AI.  - Echo (10/20): EF < 20%, RV normal, mild Philip.  - Echo (12/21): EF <20%, moderate LV dilation with mild LVH, mildly  decreased RV systolic function. 3. CKD 4. 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  5. Atrial tachycardia: paroxysmal. 6. Atrial flutter: s/p ablation in 8/16.  7. OSA 8. Hyperlipidemia 9. Shingles with post-herpetic neuralgia.  10. Atrial fibrillation: Paroxysmal.   FH: Father with CHF, complete heart block with PPM.  Sister with complete heart block and PPM.   SH: Retired Education administrator, nonsmoker, lives in [place]  ROS: All systems reviewed and negative except as per HPI  Current Outpatient Medications  Medication Sig Dispense Refill  . acetaminophen (TYLENOL) 325 MG  tablet Take 1-2 tablets (325-650 mg total) by mouth every 4 (four) hours as needed for mild pain.    . bisoprolol (ZEBETA) 5 MG tablet TAKE 1 TABLET BY MOUTH DAILY. 90 tablet 3  . digoxin (LANOXIN) 0.125 MG tablet Take 1 tablet By Mouth Daily. 30 tablet 2  . ELIQUIS 5 MG TABS tablet TAKE 1 TABLET (5 MG TOTAL) BY MOUTH 2 (TWO) TIMES DAILY. 60 tablet 5  . ENTRESTO 24-26 MG TAKE 1 TABLET BY MOUTH 2 (TWO) TIMES DAILY. 180 tablet 3  . furosemide (LASIX) 20 MG tablet TAKE 1 TABLET (20 MG TOTAL) 2 (TWO) TIMES DAILY BY MOUTH. 180 tablet 3  . hydrALAZINE (APRESOLINE) 25 MG tablet Take 0.5 tablets (12.5 mg total) by mouth 3 (three) times daily. 135 tablet 3  . isosorbide mononitrate (IMDUR) 30 MG 24 hr tablet Take 1 tablet (30 mg total) by mouth daily. 30 tablet 3  . simvastatin (ZOCOR) 40 MG tablet TAKE 1 TABLET BY MOUTH EVERY DAY 90 tablet 3  . spironolactone (ALDACTONE) 25 MG tablet TAKE 1 TABLET BY MOUTH DAILY. 90 tablet 3  . dapagliflozin propanediol (FARXIGA) 10 MG TABS tablet Take 1 tablet (10 mg total) by mouth daily before breakfast. 30 tablet 11   No current facility-administered medications for this encounter.   BP 104/60   Pulse 72   Wt 96.2 kg (212 lb)   SpO2 100%   BMI 28.75 kg/m  General: NAD Neck: No JVD, no thyromegaly or thyroid nodule.  Lungs: Clear to auscultation bilaterally with normal respiratory effort. CV: Nondisplaced PMI.  Heart regular S1/S2, no S3/S4, no murmur.  No peripheral edema.  No carotid bruit.  Normal pedal pulses.  Abdomen: Soft, nontender, no hepatosplenomegaly, no distention.  Skin: Intact without lesions or rashes.  Neurologic: Alert and oriented x 3.  Psych: Normal affect. Extremities: No clubbing or cyanosis.  HEENT: Normal.   Assessment/Plan: 1. Chronic systolic CHF: Nonischemic cardiomyopathy.  EF 15% on echo in 8/16.  Patient had borderline reduced EF when PPM was placed in 2/15.  After that, EF fell significantly.  He has a history of complete  heart block and father had CHF and CHB with pacemaker, and sounds like sister also had CHB with pacemaker. 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  He had CRT upgrade.  No significant CAD on last cath.  Echo in 4/17 showed improvement in EF to 30-35%.  However, his LV lead became dislodged and he is no longer BiV pacing, now RV pacing > 99% of the time.  Echo in 5/18 showed EF down to 20%.  Echo today shows EF < 20%. Symptomatically, he is still doing well (NYHA class II) with no volume overload on exam or by Optivol.  However, I am concerned about his long-term prognosis as his LV is very dysfunctional and dilated.  I think that we really need to try to  get his device upgraded for CRT. He does not have much BP room for medication titration.  - Continue bisoprolol 5 mg daily.  - Continue  Entresto 24/26 bid, spironolactone 25 daily, Lasix 20 bid, hydralazine 12.5 mg tid and Imdur 30 daily.  BMET today.   - Continue digoxin. Get digoxin level today.  - I will have him start Farxiga 10 mg daily.  BMET 10 days.  - We have discussed genetic evaluation to look for LMNA or SCN5A mutations that could be used for familial screening, he has wanted to hold off.  - I would strongly favor replacing LV lead.  I am going to send him back to Dr. Lovena Le to re-evaluate this.  The patient is willing to have the lead replaced (we discussed today).  2. Atrial flutter: s/p ablation.  3. Atrial fibrillation: Currently in NSR, has occasional AF by device interrogation but not often.  - Continue Eliquis, CBC today. 3. PE/DVT: >1 year ago and associated with surgery.   4. Hyperlipidemia: He has mild CAD.   - No ASA with Eliquis.   - Continue simvastatin 40 mg daily, good lipids when last checked.   5. Complete heart block: Now RV pacing with loss of LV lead, see discussion above.   Followup 3 months.   Loralie Champagne 02/02/2020

## 2020-02-03 MED FILL — FARXIGA 10 MG TABLET: 10 | 30 days supply | Qty: 30 | Fill #0

## 2020-02-07 ENCOUNTER — Ambulatory Visit (INDEPENDENT_AMBULATORY_CARE_PROVIDER_SITE_OTHER): Payer: Medicare PPO

## 2020-02-07 DIAGNOSIS — Z95810 Presence of automatic (implantable) cardiac defibrillator: Secondary | ICD-10-CM | POA: Diagnosis not present

## 2020-02-07 DIAGNOSIS — I5042 Chronic combined systolic (congestive) and diastolic (congestive) heart failure: Secondary | ICD-10-CM | POA: Diagnosis not present

## 2020-02-08 NOTE — Progress Notes (Signed)
EPIC Encounter for ICM Monitoring  Patient Name: Philip Chavez is a 70 y.o. male Date: 02/08/2020 Primary Care Physican: Melene Plan, MD Primary Cardiologist:Mc[place] Electrophysiologist: Ladona Ridgel RV Pacing:98.9% 12/29/2021Weight:212lbs  Since 02-Feb-2020 Time in AT/AF 5.1 hr/day (21.0%) Longest AT/AF 22 hours   Spoke with patient and reports feeling well at this time.  Denies fluid symptoms.    OptiVolThoracic impedancesuggesting normal fluid levels.  Prescribed:Furosemide 20 mg 1 tablet twice a day.  Labs: 02/02/2020 Creatinine 1.24, BUN 12, Potassium 4.1, Sodium 138, GFR >60 10/08/2019 Creatinine 1.22, BUN 13, Potassium 3.8, Sodium 135, GFR >60 05/26/2019 Creatinine 1.28, BUN 15, Potassium 3.8, Sodium 138, GFR 57->60 02/22/2019 Creatinine 1.34, BUN 15, Potassium 4.2, Sodium 137, GFR 54->60 A complete set of results can be found in Results Review.  Recommendations:No changes and encouraged to call if experiencing any fluid symptoms.  Follow-up plan: ICM clinic phone appointment on2/08/2020. 91 day device clinic remote transmission1/01/2021.   EP/Cardiology Office Visits:02/02/2020 with Dr.McLean.   Copy of ICM check sent to Dr.Taylor.   3 month ICM trend: 02/07/2020.    1 Year ICM trend:       Karie Soda, RN 02/08/2020 4:13 PM

## 2020-02-14 MED FILL — DIGOXIN 0.125 MG TABLET: 125 | 30 days supply | Qty: 30 | Fill #4

## 2020-02-16 ENCOUNTER — Ambulatory Visit (INDEPENDENT_AMBULATORY_CARE_PROVIDER_SITE_OTHER): Payer: Medicare PPO

## 2020-02-16 ENCOUNTER — Other Ambulatory Visit: Payer: Self-pay

## 2020-02-16 ENCOUNTER — Ambulatory Visit (HOSPITAL_COMMUNITY)
Admission: RE | Admit: 2020-02-16 | Discharge: 2020-02-16 | Disposition: A | Discharge status: Home / Self Care | Payer: Medicare PPO | Source: Ambulatory Visit | Attending: Cardiology | Admitting: Cardiology

## 2020-02-16 DIAGNOSIS — I5042 Chronic combined systolic (congestive) and diastolic (congestive) heart failure: Secondary | ICD-10-CM | POA: Insufficient documentation

## 2020-02-16 DIAGNOSIS — I442 Atrioventricular block, complete: Secondary | ICD-10-CM | POA: Diagnosis not present

## 2020-02-16 LAB — CUP PACEART REMOTE DEVICE CHECK
Battery Remaining Longevity: 20 mo
Battery Voltage: 2.91 V
Brady Statistic AP VP Percent: 11.91 %
Brady Statistic AP VS Percent: 0 %
Brady Statistic AS VP Percent: 87.36 %
Brady Statistic AS VS Percent: 0.72 %
Brady Statistic RA Percent Paced: 11.4 %
Brady Statistic RV Percent Paced: 98.58 %
Date Time Interrogation Session: 20220112052724
HighPow Impedance: 62 Ohm
Implantable Lead Implant Date: 20150209
Implantable Lead Implant Date: 20160919
Implantable Lead Implant Date: 20160919
Implantable Lead Location: 753858
Implantable Lead Location: 753859
Implantable Lead Location: 753860
Implantable Lead Model: 4598
Implantable Lead Model: 5076
Implantable Lead Model: 6935
Implantable Pulse Generator Implant Date: 20160919
Lead Channel Impedance Value: 266 Ohm
Lead Channel Impedance Value: 266 Ohm
Lead Channel Impedance Value: 266 Ohm
Lead Channel Impedance Value: 304 Ohm
Lead Channel Impedance Value: 323 Ohm
Lead Channel Impedance Value: 342 Ohm
Lead Channel Impedance Value: 380 Ohm
Lead Channel Impedance Value: 437 Ohm
Lead Channel Impedance Value: 456 Ohm
Lead Channel Impedance Value: 456 Ohm
Lead Channel Impedance Value: 513 Ohm
Lead Channel Impedance Value: 513 Ohm
Lead Channel Impedance Value: 513 Ohm
Lead Channel Pacing Threshold Amplitude: 0.875 V
Lead Channel Pacing Threshold Amplitude: 0.875 V
Lead Channel Pacing Threshold Amplitude: 2.5 V
Lead Channel Pacing Threshold Pulse Width: 0.4 ms
Lead Channel Pacing Threshold Pulse Width: 0.4 ms
Lead Channel Pacing Threshold Pulse Width: 0.4 ms
Lead Channel Sensing Intrinsic Amplitude: 11.5 mV
Lead Channel Sensing Intrinsic Amplitude: 11.5 mV
Lead Channel Sensing Intrinsic Amplitude: 3.25 mV
Lead Channel Sensing Intrinsic Amplitude: 3.25 mV
Lead Channel Setting Pacing Amplitude: 1.75 V
Lead Channel Setting Pacing Amplitude: 2 V
Lead Channel Setting Pacing Pulse Width: 0.4 ms
Lead Channel Setting Sensing Sensitivity: 0.3 mV

## 2020-02-16 LAB — BASIC METABOLIC PANEL
Anion gap: 9 (ref 5–15)
BUN: 15 mg/dL (ref 8–23)
CO2: 25 mmol/L (ref 22–32)
Calcium: 9.3 mg/dL (ref 8.9–10.3)
Chloride: 104 mmol/L (ref 98–111)
Creatinine, Ser: 1.33 mg/dL — ABNORMAL HIGH (ref 0.61–1.24)
GFR, Estimated: 58 mL/min — ABNORMAL LOW (ref >60)
Glucose, Bld: 111 mg/dL — ABNORMAL HIGH (ref 70–99)
Potassium: 3.8 mmol/L (ref 3.5–5.1)
Sodium: 138 mmol/L (ref 135–145)

## 2020-02-29 NOTE — Progress Notes (Signed)
Remote ICD transmission.   

## 2020-03-03 MED FILL — FARXIGA 10 MG TABLET: 10 | 30 days supply | Qty: 30 | Fill #1

## 2020-03-04 MED FILL — HYDRALAZINE HCL 25 MG TABS: 25 | 90 days supply | Qty: 135 | Fill #3

## 2020-03-13 ENCOUNTER — Ambulatory Visit (INDEPENDENT_AMBULATORY_CARE_PROVIDER_SITE_OTHER): Payer: Medicare PPO

## 2020-03-13 DIAGNOSIS — Z95810 Presence of automatic (implantable) cardiac defibrillator: Secondary | ICD-10-CM | POA: Diagnosis not present

## 2020-03-13 DIAGNOSIS — I5042 Chronic combined systolic (congestive) and diastolic (congestive) heart failure: Secondary | ICD-10-CM | POA: Diagnosis not present

## 2020-03-17 MED FILL — DIGOXIN 0.125 MG TABLET: 125 | 30 days supply | Qty: 30 | Fill #5

## 2020-03-17 NOTE — Progress Notes (Signed)
EPIC Encounter for ICM Monitoring  Patient Name: Philip Chavez is a 70 y.o. male Date: 03/17/2020 Primary Care Physican: Wilber Oliphant, MD Primary [place] Electrophysiologist: Lovena Le RV Pacing:98.8% 12/29/2021Weight:212lbs  Clinical Status (16-Feb-2020 to 13-Mar-2020) AT/AF 7 episodes Time in AT/AF 3.7 hr/day (15.2%) Longest AT/AF 46 hours   Transmission reviewed.  OptiVolThoracic impedancesuggesting normal fluid levels.  Message sent to device clinic to review AT/AF burden increase since 12/06/2019.  Prescribed:Furosemide 20 mg 1 tablet twice a day.  Labs: 02/16/2020 Creatinine 1.33, BUN 15, Potassium 3.8, Sodium 138, GFR 58 02/02/2020 Creatinine 1.24, BUN 12, Potassium 4.1, Sodium 138, GFR >60 10/08/2019 Creatinine 1.22, BUN 13, Potassium 3.8, Sodium 135, GFR >60 05/26/2019 Creatinine 1.28, BUN 15, Potassium 3.8, Sodium 138, GFR 57->60 02/22/2019 Creatinine 1.34, BUN 15, Potassium 4.2, Sodium 137, GFR 54->60 A complete set of results can be found in Results Review.  Recommendations:No changes.  Follow-up plan: ICM clinic phone appointment on3/14/2022. 91 day device clinic remote transmission4/01/2021.   EP/Cardiology Office Visits:05/03/2020 with Dr.McLean.   Copy of ICM check sent to Dr.Taylor.  3 month ICM trend: 03/13/2020.    1 Year ICM trend:       Rosalene Billings, RN 03/17/2020 8:45 AM

## 2020-03-30 MED FILL — ELIQUIS 5 MG TABLET: 5 | 30 days supply | Qty: 60 | Fill #5

## 2020-03-30 MED FILL — ENTRESTO 24 MG-26 MG TABLET: 24-26 | 30 days supply | Qty: 60 | Fill #6

## 2020-03-31 ENCOUNTER — Encounter: Payer: Self-pay | Admitting: *Deleted

## 2020-03-31 DIAGNOSIS — K4090 Unilateral inguinal hernia, without obstruction or gangrene, not specified as recurrent: Secondary | ICD-10-CM | POA: Diagnosis not present

## 2020-03-31 NOTE — Telephone Encounter (Signed)
This encounter was created in error - please disregard.

## 2020-04-03 MED FILL — FARXIGA 10 MG TABLET: 10 | 30 days supply | Qty: 30 | Fill #2

## 2020-04-15 MED FILL — DIGOXIN 0.125 MG TABLET: 125 | 30 days supply | Qty: 30 | Fill #6

## 2020-04-17 ENCOUNTER — Ambulatory Visit (INDEPENDENT_AMBULATORY_CARE_PROVIDER_SITE_OTHER): Payer: Medicare PPO

## 2020-04-17 DIAGNOSIS — Z95810 Presence of automatic (implantable) cardiac defibrillator: Secondary | ICD-10-CM

## 2020-04-17 DIAGNOSIS — I5042 Chronic combined systolic (congestive) and diastolic (congestive) heart failure: Secondary | ICD-10-CM

## 2020-04-18 ENCOUNTER — Other Ambulatory Visit (HOSPITAL_COMMUNITY): Payer: Self-pay

## 2020-04-18 ENCOUNTER — Other Ambulatory Visit (HOSPITAL_COMMUNITY): Payer: Self-pay | Admitting: Cardiology

## 2020-04-18 MED ORDER — BISOPROLOL FUMARATE 5 MG PO TABS: 5.0000 mg | ORAL_TABLET | Freq: Every day | ORAL | 3 refills | Status: DC

## 2020-04-19 NOTE — Progress Notes (Signed)
EPIC Encounter for ICM Monitoring  Patient Name: Philip Chavez is a 70 y.o. male Date: 04/19/2020 Primary Care Physican: Wilber Oliphant, MD Primary [place] Electrophysiologist: Lovena Le RV Pacing:99% 12/29/2021Weight:212lbs  Clinical Status (13-Mar-2020 to 17-Apr-2020) AT/AF 27 episodes  Time in AT/AF 7.1 hr/day (29.5%)       Spoke with patient and reports feeling well at this time.  Denies fluid symptoms.    OptiVolThoracic impedancesuggesting normal fluid levels.  AT/AF Burden increase from 15.2% to 29.5% since last month.   Prescribed:Furosemide 20 mg 1 tablet twice a day.  Labs: 02/16/2020 Creatinine 1.33, BUN 15, Potassium 3.8, Sodium 138, GFR 58 02/02/2020 Creatinine 1.24, BUN 12, Potassium 4.1, Sodium 138, GFR >60 10/08/2019 Creatinine 1.22, BUN 13, Potassium 3.8, Sodium 135, GFR >60 05/26/2019 Creatinine 1.28, BUN 15, Potassium 3.8, Sodium 138, GFR 57->60 02/22/2019 Creatinine 1.34, BUN 15, Potassium 4.2, Sodium 137, GFR 54->60 A complete set of results can be found in Results Review.  Recommendations: No changes and encouraged to call if experiencing any fluid symptoms.  Follow-up plan: ICM clinic phone appointment on4/18/2022. 91 day device clinic remote transmission4/01/2021.   EP/Cardiology Office Visits:05/03/2020 with Dr.McLean.  Advised to call the office since there was a recall 03/19/2020 with Dr Lovena Le.   Copy of ICM check sent to Dr.Taylor.  3 month ICM trend: 04/17/2020.    1 Year ICM trend:       Rosalene Billings, RN 04/19/2020 7:55 AM

## 2020-04-28 ENCOUNTER — Other Ambulatory Visit (HOSPITAL_COMMUNITY): Payer: Self-pay | Admitting: Internal Medicine

## 2020-04-28 ENCOUNTER — Other Ambulatory Visit (HOSPITAL_COMMUNITY): Payer: Self-pay | Admitting: Cardiology

## 2020-04-28 MED FILL — ENTRESTO 24 MG-26 MG TABLET: 24-26 | 30 days supply | Qty: 60 | Fill #7

## 2020-04-28 MED FILL — spIRONOLACTONE 25 MG TABS: 25 | 90 days supply | Qty: 90 | Fill #0

## 2020-04-28 MED FILL — ELIQUIS 5 MG TABLET: 5 | 30 days supply | Qty: 60 | Fill #0

## 2020-04-30 MED FILL — FARXIGA 10 MG TABLET: 10 | 30 days supply | Qty: 30 | Fill #3

## 2020-05-01 ENCOUNTER — Ambulatory Visit: Payer: Medicare PPO | Admitting: Student

## 2020-05-01 ENCOUNTER — Other Ambulatory Visit: Payer: Self-pay

## 2020-05-01 ENCOUNTER — Encounter: Payer: Self-pay | Admitting: Student

## 2020-05-01 VITALS — BP 108/60 | HR 80 | Ht 73.0 in | Wt 207.0 lb

## 2020-05-01 DIAGNOSIS — I5042 Chronic combined systolic (congestive) and diastolic (congestive) heart failure: Secondary | ICD-10-CM | POA: Diagnosis not present

## 2020-05-01 DIAGNOSIS — I428 Other cardiomyopathies: Secondary | ICD-10-CM

## 2020-05-01 DIAGNOSIS — Z95810 Presence of automatic (implantable) cardiac defibrillator: Secondary | ICD-10-CM | POA: Diagnosis not present

## 2020-05-01 DIAGNOSIS — I442 Atrioventricular block, complete: Secondary | ICD-10-CM

## 2020-05-01 DIAGNOSIS — I480 Paroxysmal atrial fibrillation: Secondary | ICD-10-CM | POA: Diagnosis not present

## 2020-05-01 LAB — CUP PACEART INCLINIC DEVICE CHECK
Battery Remaining Longevity: 19 mo
Battery Voltage: 2.9 V
Brady Statistic AP VP Percent: 9.85 %
Brady Statistic AP VS Percent: 0 %
Brady Statistic AS VP Percent: 89.52 %
Brady Statistic AS VS Percent: 0.62 %
Brady Statistic RA Percent Paced: 7.9 %
Brady Statistic RV Percent Paced: 98.92 %
Date Time Interrogation Session: 20220328135805
HighPow Impedance: 60 Ohm
Implantable Lead Implant Date: 20150209
Implantable Lead Implant Date: 20160919
Implantable Lead Implant Date: 20160919
Implantable Lead Location: 753858
Implantable Lead Location: 753859
Implantable Lead Location: 753860
Implantable Lead Model: 4598
Implantable Lead Model: 5076
Implantable Lead Model: 6935
Implantable Pulse Generator Implant Date: 20160919
Lead Channel Impedance Value: 304 Ohm
Lead Channel Impedance Value: 323 Ohm
Lead Channel Impedance Value: 342 Ohm
Lead Channel Impedance Value: 342 Ohm
Lead Channel Impedance Value: 380 Ohm
Lead Channel Impedance Value: 380 Ohm
Lead Channel Impedance Value: 380 Ohm
Lead Channel Impedance Value: 437 Ohm
Lead Channel Impedance Value: 513 Ohm
Lead Channel Impedance Value: 532 Ohm
Lead Channel Impedance Value: 589 Ohm
Lead Channel Impedance Value: 589 Ohm
Lead Channel Impedance Value: 627 Ohm
Lead Channel Pacing Threshold Amplitude: 0.75 V
Lead Channel Pacing Threshold Amplitude: 0.875 V
Lead Channel Pacing Threshold Amplitude: 2.5 V
Lead Channel Pacing Threshold Pulse Width: 0.4 ms
Lead Channel Pacing Threshold Pulse Width: 0.4 ms
Lead Channel Pacing Threshold Pulse Width: 0.4 ms
Lead Channel Sensing Intrinsic Amplitude: 12.125 mV
Lead Channel Sensing Intrinsic Amplitude: 14 mV
Lead Channel Sensing Intrinsic Amplitude: 2.125 mV
Lead Channel Sensing Intrinsic Amplitude: 2.625 mV
Lead Channel Setting Pacing Amplitude: 1.75 V
Lead Channel Setting Pacing Amplitude: 2 V
Lead Channel Setting Pacing Pulse Width: 0.4 ms
Lead Channel Setting Sensing Sensitivity: 0.3 mV

## 2020-05-01 NOTE — Patient Instructions (Signed)
Medication Instructions:  Your physician recommends that you continue on your current medications as directed. Please refer to the Current Medication list given to you today.  *If you need a refill on your cardiac medications before your next appointment, please call your pharmacy*   Lab Work: None If you have labs (blood work) drawn today and your tests are completely normal, you will receive your results only by: Marland Kitchen MyChart Message (if you have MyChart) OR . A paper copy in the mail If you have any lab test that is abnormal or we need to change your treatment, we will call you to review the results.   Follow-Up: At [facility], you and your health needs are our priority.  As part of our continuing mission to provide you with exceptional heart care, we have created designated Provider Care Teams.  These Care Teams include your primary Cardiologist (physician) and Advanced Practice Providers (APPs -  Physician Assistants and Nurse Practitioners) who all work together to provide you with the care you need, when you need it.  We recommend signing up for the patient portal called "MyChart".  Sign up information is provided on this After Visit Summary.  MyChart is used to connect with patients for Virtual Visits (Telemedicine).  Patients are able to view lab/test results, encounter notes, upcoming appointments, etc.  Non-urgent messages can be sent to your provider as well.   To learn more about what you can do with MyChart, go to [email or web address].    Your next appointment:   As scheduled with Dr Lovena Le

## 2020-05-01 NOTE — Progress Notes (Signed)
Electrophysiology Office Note Date: 05/01/2020  ID:  Maico Mulvehill, DOB 1950/10/31, MRN [id number]  PCP: Wilber Oliphant, MD Primary Cardiologist: No primary care provider on file. Electrophysiologist: [name], MD   CC: Pacemaker follow-up  Keyshawn Hellwig is a 70 y.o. male seen today for [name], MD for routine electrophysiology followup.  Since last being seen in our clinic the patient reports doing very well. Currently, he states he can do anything he wants without SOB of fatigue.  he denies chest pain, palpitations, dyspnea, PND, orthopnea, nausea, vomiting, dizziness, syncope, edema, weight gain, or early satiety.  Device History: MDT dual chamber PPM implanted 2015 for CHB; upgrade to MDT CRTD  2016 for worsening LV function and CHF - Ultimately loss function of LV lead. History of appropriate therapy: No History of AAD therapy: No  Past Medical History:  Diagnosis Date  . AICD (automatic cardioverter/defibrillator) present 0919/2016   BIV   . Atrial flutter ([place])    a. 08/2014 presented w/ aflutter->converted on amio;  b. CHA2DS2VASc = 2 -->Xarelto.  . Atrial tachycardia ([place])   . Chronic combined systolic and diastolic CHF (congestive heart failure) ([place])    a. 03/2013 Echo EF 50-55%;  b. 07/2013 Echo EF 20-25%, diff HK, Gr3 DD;  c. 08/2014 Echo: EF 15%, diff HK, mild AI/MR, mildly dil LA/RV, mod TR, PASP 65mmHg.  Marland Kitchen DVT (deep venous thrombosis) ([place])    a. 2007 RLE: S/P ankle surgery;  b. 03/2013 LLE DVT & PE-->Xarelto.  . 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 2007 RLE: S/P ankle surgery;  b. 03/2013 LLE DVT & PE-->Xarelto.  . Hypercholesterolemia   . Hypercoagulable state ([place])   . Hypertension   . Musculoskeletal neck pain   . Nonischemic cardiomyopathy ([place])    a. 07/2013 Echo: EF 20-25%;  b. 07/2013 Myoview: Large inferior, lateral, apical scar w/ HK, no ischemia, EF 17%;  b. 08/2014 Echo: EF 15%, diff HK.  Marland Kitchen Pneumonia ~ 04/2014; 09/02/2014  . Presence of  permanent cardiac pacemaker    a. 03/2013 s/p MDT ADDRL1 Adapta DC PPM, ser # [id number].  . Sleep apnea    does not wear mask (09/02/2014)  . Third degree heart block ([place])    a. 03/2013 s/p MDT ADDRL1 Adapta DC PPM, ser # [id number].   Past Surgical History:  Procedure Laterality Date  . CARDIAC CATHETERIZATION N/A 10/17/2014   Procedure: Right/Left Heart Cath and Coronary Angiography;  Surgeon: Larey Dresser, MD;  Location: [place] [facility];  Service: Cardiovascular;  Laterality: N/A;  . ELECTROPHYSIOLOGIC STUDY N/A 09/07/2014   Procedure: A-Flutter;  Surgeon: Evans Lance, MD;  Location: [place] [facility];  Service: Cardiovascular;  Laterality: N/A;  . EP IMPLANTABLE DEVICE  10/24/2014   BIV  . EP IMPLANTABLE DEVICE N/A 10/24/2014   Procedure: BiV ICD Upgrade;  Surgeon: Evans Lance, MD;  Location: [place] [facility];  Service: Cardiovascular;  Laterality: N/A;  . FOOT FRACTURE SURGERY Right 2007  . FRACTURE SURGERY    . INGUINAL HERNIA REPAIR Right 1980's  . INSERT / REPLACE / REMOVE PACEMAKER     MDT ADDRL1 pacemaker implanted by Dr Lovena Le for complete heart block  . PERMANENT PACEMAKER INSERTION N/A 03/15/2013   Procedure: PERMANENT PACEMAKER INSERTION;  Surgeon: Evans Lance, MD;  Location: [facility] C[facility];  Service: Cardiovascular;  Laterality: N/A;    Current Outpatient Medications  Medication Sig Dispense Refill  . acetaminophen (TYLENOL) 325 MG tablet  Take 1-2 tablets (325-650 mg total) by mouth every 4 (four) hours as needed for mild pain.    . bisoprolol (ZEBETA) 5 MG tablet Take 1 tablet (5 mg total) by mouth daily. 30 tablet 3  . dapagliflozin propanediol (FARXIGA) 10 MG TABS tablet Take 1 tablet (10 mg total) by mouth daily before breakfast. 30 tablet 11  . digoxin (LANOXIN) 0.125 MG tablet Take 1 tablet By Mouth Daily. 30 tablet 2  . ELIQUIS 5 MG TABS tablet TAKE 1 TABLET BY MOUTH TWO TIMES DAILY 60 tablet 5  . ENTRESTO 24-26 MG TAKE 1 TABLET BY MOUTH 2 (TWO) TIMES  DAILY. 180 tablet 3  . furosemide (LASIX) 20 MG tablet TAKE 1 TABLET (20 MG TOTAL) 2 (TWO) TIMES DAILY BY MOUTH. 180 tablet 3  . hydrALAZINE (APRESOLINE) 25 MG tablet Take 0.5 tablets (12.5 mg total) by mouth 3 (three) times daily. 135 tablet 3  . isosorbide mononitrate (IMDUR) 30 MG 24 hr tablet Take 1 tablet (30 mg total) by mouth daily. 30 tablet 3  . simvastatin (ZOCOR) 40 MG tablet TAKE 1 TABLET BY MOUTH EVERY DAY 90 tablet 3  . spironolactone (ALDACTONE) 25 MG tablet TAKE 1 TABLET BY MOUTH DAILY. 90 tablet 1   No current facility-administered medications for this visit.    Allergies:   Patient has no known allergies.   Social History: Social History   Socioeconomic History  . Marital status: Married    Spouse name: Not on file  . Number of children: Not on file  . Years of education: Not on file  . Highest education level: Not on file  Occupational History  . Not on file  Tobacco Use  . Smoking status: Former Smoker    Packs/day: 1.50    Years: 10.00    Pack years: 15.00    Types: Cigarettes  . Smokeless tobacco: Never Used  . Tobacco comment: 09/02/2014  "quit smoking 20-30 yr ago"  Vaping Use  . Vaping Use: Never used  Substance and Sexual Activity  . Alcohol use: No  . Drug use: No  . Sexual activity: Yes  Other Topics Concern  . Not on file  Social History Narrative  . Not on file   Social Determinants of Health   Financial Resource Strain: Not on file  Food Insecurity: Not on file  Transportation Needs: Not on file  Physical Activity: Not on file  Stress: Not on file  Social Connections: Not on file  Intimate Partner Violence: Not on file    Family History: Family History  Problem Relation Age of Onset  . Diabetes type II Mother   . Heart disease Father   . Arrhythmia Father   . Arrhythmia Sister   . Hypertension Neg Hx   . Heart attack Neg Hx   . Stroke Neg Hx      Review of Systems: All other systems reviewed and are otherwise negative  except as noted above.  Physical Exam: Vitals:   05/01/20 1153  BP: 108/60  Pulse: 80  SpO2: 99%  Weight: 207 lb (93.9 kg)  Height: 6\' 1"  (1.854 m)     GEN- The patient is well appearing, alert and oriented x 3 today.   HEENT: normocephalic, atraumatic; sclera clear, conjunctiva pink; hearing intact; oropharynx clear; neck supple  Lungs- Clear to ausculation bilaterally, normal work of breathing.  No wheezes, rales, rhonchi Heart- Regular rate and rhythm, no murmurs, rubs or gallops  GI- soft, non-tender, non-distended, bowel sounds present  Extremities- no clubbing or cyanosis. No edema MS- no significant deformity or atrophy Skin- warm and dry, no rash or lesion; PPM pocket well healed Psych- euthymic mood, full affect Neuro- strength and sensation are intact  PPM Interrogation- reviewed in detail today,  See PACEART report  EKG:  EKG is not ordered today. The ekg ordered 02/02/2020 shows RV pacing with QRS > 180 ms, Positive in lead V1.  Recent Labs: 02/02/2020: Hemoglobin 15.2; Platelets 164 02/16/2020: BUN 15; Creatinine, Ser 1.33; Potassium 3.8; Sodium 138   Wt Readings from Last 3 Encounters:  05/01/20 207 lb (93.9 kg)  02/02/20 212 lb (96.2 kg)  10/08/19 208 lb (94.3 kg)     Other studies Reviewed: Additional studies/ records that were reviewed today include: Previous EP office notes, Previous remote checks, Most recent labwork.   Assessment and Plan:  1. CHB and NICM CMP s/p Medtronic PPM  Echo 02/02/2020 LVEF <20% Normal PPM function See Pace Art report No changes today  2. H/o VT Occasional NSVT noted. No sustained. No ICD shocks.   3. PAF Burden ~25% by device NSR today.   4. Chronic systolic CHF Non-functioning LV lead EF ~20% 01/2020 We discussed LV lead at length today. QRS is quite wide.  He would likely benefit from lead revision (would likely require extraction of old lead and attempt at LBBB pacing per discussion with Dr. Lovena Le), with  possible mortality benefit. He denies SOB or fatigue at any point currently.  He has a possible impacted/infected tooth that is pending assessment, this would need to occur and completely heal prior to system revision.  He would prefer to continue to think about this and follow up with Dr. Lovena Le in person to discuss further.   Current medicines are reviewed at length with the patient today.   The patient does not have concerns regarding his medicines.  The following changes were made today:  none  Labs/ tests ordered today include:  No orders of the defined types were placed in this encounter.   Disposition:   Follow up with Dr. Lovena Le in 3 months (Pt is only available to come in on Monday/Wed/Friday). Appointment made today. Pt knows to call sooner with any concerns or questions. He would prefer not to get surgery overall, but is willing to consider lead revision and would like to talk more in person with Dr. Lovena Le re: the potential of any benefit.    Jacalyn Lefevre, PA-C  05/01/2020 12:00 PM  [place] [id number] [phone] (office) [phone] (fax)

## 2020-05-03 ENCOUNTER — Other Ambulatory Visit: Payer: Self-pay

## 2020-05-03 ENCOUNTER — Encounter (HOSPITAL_COMMUNITY): Payer: Self-pay | Admitting: Cardiology

## 2020-05-03 ENCOUNTER — Ambulatory Visit (HOSPITAL_COMMUNITY)
Admission: RE | Admit: 2020-05-03 | Discharge: 2020-05-03 | Disposition: A | Discharge status: Home / Self Care | Payer: Medicare PPO | Source: Ambulatory Visit | Attending: Cardiology | Admitting: Cardiology

## 2020-05-03 VITALS — BP 110/68 | HR 71 | Wt 208.0 lb

## 2020-05-03 DIAGNOSIS — Z8249 Family history of ischemic heart disease and other diseases of the circulatory system: Secondary | ICD-10-CM | POA: Diagnosis not present

## 2020-05-03 DIAGNOSIS — Z7901 Long term (current) use of anticoagulants: Secondary | ICD-10-CM | POA: Diagnosis not present

## 2020-05-03 DIAGNOSIS — G4733 Obstructive sleep apnea (adult) (pediatric): Secondary | ICD-10-CM | POA: Diagnosis not present

## 2020-05-03 DIAGNOSIS — I442 Atrioventricular block, complete: Secondary | ICD-10-CM | POA: Insufficient documentation

## 2020-05-03 DIAGNOSIS — I428 Other cardiomyopathies: Secondary | ICD-10-CM | POA: Insufficient documentation

## 2020-05-03 DIAGNOSIS — I2510 Atherosclerotic heart disease of native coronary artery without angina pectoris: Secondary | ICD-10-CM | POA: Insufficient documentation

## 2020-05-03 DIAGNOSIS — I480 Paroxysmal atrial fibrillation: Secondary | ICD-10-CM

## 2020-05-03 DIAGNOSIS — N189 Chronic kidney disease, unspecified: Secondary | ICD-10-CM | POA: Insufficient documentation

## 2020-05-03 DIAGNOSIS — Z86718 Personal history of other venous thrombosis and embolism: Secondary | ICD-10-CM | POA: Diagnosis not present

## 2020-05-03 DIAGNOSIS — I4892 Unspecified atrial flutter: Secondary | ICD-10-CM | POA: Diagnosis not present

## 2020-05-03 DIAGNOSIS — I4819 Other persistent atrial fibrillation: Secondary | ICD-10-CM | POA: Insufficient documentation

## 2020-05-03 DIAGNOSIS — I5022 Chronic systolic (congestive) heart failure: Secondary | ICD-10-CM | POA: Diagnosis not present

## 2020-05-03 DIAGNOSIS — E785 Hyperlipidemia, unspecified: Secondary | ICD-10-CM | POA: Diagnosis not present

## 2020-05-03 DIAGNOSIS — Z79899 Other long term (current) drug therapy: Secondary | ICD-10-CM | POA: Diagnosis not present

## 2020-05-03 LAB — BASIC METABOLIC PANEL
Anion gap: 6 (ref 5–15)
BUN: 11 mg/dL (ref 8–23)
CO2: 28 mmol/L (ref 22–32)
Calcium: 9.3 mg/dL (ref 8.9–10.3)
Chloride: 105 mmol/L (ref 98–111)
Creatinine, Ser: 1.38 mg/dL — ABNORMAL HIGH (ref 0.61–1.24)
GFR, Estimated: 55 mL/min — ABNORMAL LOW (ref >60)
Glucose, Bld: 109 mg/dL — ABNORMAL HIGH (ref 70–99)
Potassium: 3.8 mmol/L (ref 3.5–5.1)
Sodium: 139 mmol/L (ref 135–145)

## 2020-05-03 LAB — DIGOXIN LEVEL: Digoxin Level: 1.1 ng/mL (ref 0.8–2.0)

## 2020-05-03 NOTE — Progress Notes (Signed)
Patient ID: Philip Chavez, male   DOB: 1950/10/14, 70 y.o.   MRN: [id number] PCP: Dr. Gerlean Ren EP: Dr. Lovena Le HF cardiologist: Dr Aundra Dubin  70 y.o. with history of complete heart block/Medtronic PPM, cardiomyopathy, and atrial flutter s/p ablation presents for CHF clinic evaluation.  Patient developed complete heart block in 2/15 and had PPM placed at that time.  He paces his RV continuously.  In 2/15, EF was 50-55% by echo.  By 6/15, EF had fallen to 20-25%.  Cardiolite showed possible scar but no ischemia.  On 09/03/14, he was admitted with dyspnea and found to be in atrial flutter with RLL PNA and with volume overload.  Echo showed EF 15% with diffuse hypokinesis.  He was started on IV Lasix and IV amiodarone. He converted back to NSR.  He subsequently had atrial flutter ablation.  He had right and left heart cath in 9/16, showing nonobstructive CAD and optimized filling pressures with CI 2.09.  He had upgrade to CRT by Dr Lovena Le.  EF up to 30-35% in 4/17.  Subsequently, the LV lead dislodged.  He was offered lead revision, but preferred watchful waiting. Echo in 5/18 showed EF down to 20%.    Echo in 10/20 showed EF < 20%, normal RV.  Echo 12/21 showed EF <20%, moderate LV dilation with mild LVH, mildly decreased RV systolic function.   [name] returns for followup of CHF today. He is in atrial fibrillation today.  Looking at his device interrogation, he has had much more frequent atrial fibrillation in the last few months and has been continuously in fibrillation for the last few weeks. He still says that he feels "good" with minimal symptoms (not short of breath walking on flat ground or doing his ADLs).  No chest pain.  No orthopnea/PND.  No lightheadedness.    Labs (8/16): K 3.9, creatinine 1.03, AST 106, ALT 173, HCT 45.1, LDL 132 Labs (9/16): K 3.9, creatinine 1.16, HCT 42.5 Labs (10/16): K 3.9, creatinine 1.32, HCT 39.9 Labs (2/17): K 4.1, creatinine 1.26, BNP 107, digoxin 0.3 Labs (07/10/2015):  K 3.9 Creatinine 1.23  Labs (1/18): K 4.1, creatinine 1.18, digoxin 0.3 Labs (2/18): K 4, creatinine 1.24, digoxin 0.4 Labs (5/18): K 4, creatinine 1.25, hgb 14.8, digoxin 0.5 Labs (10/18): K 4, creatinine 1.24, digoxin 0.2 Labs (4/19): K 3.8, creatinine 1.22, digoxin 0.5, LDL 69, HDL 40 Labs (8/19): K 3.9, creatinine 1.35, digoxin 0.3 Labs (12/19): K 4, creatinine 1.22 Labs (1/21): K 4.2, creatinine 1.34, LDL 121 Labs (4/21): digoxin level 0.7, K 3.8, creatinine 1.28 Labs (9/21): digoxin 0.2, hgb 15, K 3.8, creatinine 1.22 Labs (12/21): digoxin 0.5 Labs (1/22): K 3.8, creatinine 1.33  Medtronic device interrogation: More frequent atrial fibrillation last few months then persistent last several weeks, RV pacing >99% of the time, stable thoracic impedance.   ECG (personally reviewed): Atrial fibrillation with BiV pacing, QTc 487 msec  PMH: 1. H/o complete heart block: Has Medtronic PPM, placed in 2/15.   2. Cardiomyopathy: Echo (2/15) prior to PPM placement with EF 50-55%.  Echo (6/15) with EF 20-25%,  Cardiolite at that time showed possible scar but no ischemia.  Echo (8/16) with EF 15%, diffuse hypokinesis, mildly dilated RV with normal systolic function, PA systolic pressure 69 mmHg.  LHC/RHC (9/16) with mean RA 4, PA 31/15 mean 20, mean PCWP 8, CI 2.09, 40% mLAD stenosis.  Upgrade to MDT CRT-D in 9/16.  - Echo (4/17) with EF 30-35%.   - LV lead dislodged and loss  of BiV pacing.  - Echo (5/18) with EF 20%, mild LV dilation, normal RV size with mildly decreased systolic function, mild AI.  - Echo (10/20): EF < 20%, RV normal, mild MR.  - Echo (12/21): EF <20%, moderate LV dilation with mild LVH, mildly decreased RV systolic function. 3. CKD 4. 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  5. Atrial tachycardia: paroxysmal. 6. Atrial flutter: s/p ablation in 8/16.  7. OSA 8. Hyperlipidemia 9. Shingles with post-herpetic neuralgia.  10. Atrial fibrillation: Paroxysmal.   FH: Father with  CHF, complete heart block with PPM.  Sister with complete heart block and PPM.   SH: Retired Curator, nonsmoker, lives in [place]: All systems reviewed and negative except as per HPI  Current Outpatient Medications  Medication Sig Dispense Refill  . acetaminophen (TYLENOL) 325 MG tablet Take 1-2 tablets (325-650 mg total) by mouth every 4 (four) hours as needed for mild pain.    . bisoprolol (ZEBETA) 5 MG tablet Take 1 tablet (5 mg total) by mouth daily. 30 tablet 3  . dapagliflozin propanediol (FARXIGA) 10 MG TABS tablet Take 1 tablet (10 mg total) by mouth daily before breakfast. 30 tablet 11  . digoxin (LANOXIN) 0.125 MG tablet Take 1 tablet By Mouth Daily. 30 tablet 2  . ELIQUIS 5 MG TABS tablet TAKE 1 TABLET BY MOUTH TWO TIMES DAILY 60 tablet 5  . ENTRESTO 24-26 MG TAKE 1 TABLET BY MOUTH 2 (TWO) TIMES DAILY. 180 tablet 3  . furosemide (LASIX) 20 MG tablet TAKE 1 TABLET (20 MG TOTAL) 2 (TWO) TIMES DAILY BY MOUTH. 180 tablet 3  . hydrALAZINE (APRESOLINE) 25 MG tablet Take 0.5 tablets (12.5 mg total) by mouth 3 (three) times daily. 135 tablet 3  . isosorbide mononitrate (IMDUR) 30 MG 24 hr tablet Take 1 tablet (30 mg total) by mouth daily. 30 tablet 3  . simvastatin (ZOCOR) 40 MG tablet TAKE 1 TABLET BY MOUTH EVERY DAY 90 tablet 3  . spironolactone (ALDACTONE) 25 MG tablet TAKE 1 TABLET BY MOUTH DAILY. 90 tablet 1   No current facility-administered medications for this encounter.   BP 110/68   Pulse 71   Wt 94.3 kg (208 lb)   SpO2 99%   BMI 27.44 kg/m  General: NAD Neck: No JVD, no thyromegaly or thyroid nodule.  Lungs: Clear to auscultation bilaterally with normal respiratory effort. CV: Nondisplaced PMI.  Heart regular S1/S2, no S3/S4, no murmur.  No peripheral edema.  No carotid bruit.  Normal pedal pulses.  Abdomen: Soft, nontender, no hepatosplenomegaly, no distention.  Skin: Intact without lesions or rashes.  Neurologic: Alert and oriented x 3.  Psych: Normal  affect. Extremities: No clubbing or cyanosis.  HEENT: Normal.   Assessment/Plan: 1. Chronic systolic CHF: Nonischemic cardiomyopathy.  EF 15% on echo in 8/16.  Patient had borderline reduced EF when PPM was placed in 2/15.  After that, EF fell significantly.  He has a history of complete heart block and father had CHF and CHB with pacemaker, and sounds like sister also had CHB with pacemaker. 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  He had CRT upgrade.  No significant CAD on last cath.  Echo in 4/17 showed improvement in EF to 30-35%.  However, his LV lead became dislodged and he is no longer BiV pacing, now RV pacing > 99% of the time.  Echo in 5/18 showed EF down to 20%.  Echo in 12/21 shows EF <  20% and moderately dilated. Symptomatically, he is still doing well (NYHA class II) with no volume overload on exam or by Optivol.  However, I am concerned about his long-term prognosis as his LV is very dysfunctional and dilated. He has also gone into persistent atrial fibrillation.  I think that we really need to try to get his device upgraded for CRT. He has not had much BP room for medication titration.  - Continue bisoprolol 5 mg daily.  - Continue  Entresto 24/26 bid, spironolactone 25 daily, Lasix 20 bid, hydralazine 12.5 mg tid and Imdur 30 daily.  BMET today.   - Continue digoxin. Check level.  - Continue Farxiga 10 mg daily.  - We have discussed genetic evaluation to look for LMNA or SCN5A mutations that could be used for familial screening, he has wanted to hold off.  - I would strongly favor replacing LV lead.  Still needs to see Dr. Lovena Le to re-evaluate this.  The patient is willing to have the lead replaced (we discussed again today).  - Needs to get back into NSR (see below).  2. Atrial flutter: s/p ablation.  3. Atrial fibrillation: More frequent atrial fibrillation by device interrogation over the last few months, now persistent.  - Continue  Eliquis.  - I will refer to atrial fibrillation clinic for consideration of Tikosyn initiation to try to hold him in NSR.  Ultimately, I think he would benefit from ablation if thought to be a reasonable candidate.  Again, needs to get in with Dr. Lovena Le to discuss.  3. PE/DVT: >1 year ago and associated with surgery.   4. Hyperlipidemia: He has mild CAD.   - No ASA with Eliquis.   - Continue simvastatin 40 mg daily, good lipids when last checked.   5. Complete heart block: Now RV pacing with loss of LV lead, see discussion above.   I will arrange appt in atrial fibrillation clinic for initiation of Tikosyn.  Also still needs to see Dr. Lovena Le for consideration of LV lead replacement and now consideration of AF ablation.    Loralie Champagne 05/03/2020

## 2020-05-03 NOTE — Patient Instructions (Addendum)
EKG done today.  Labs done today. We will contact you only if your labs are abnormal.  No medication changes were made. Please continue all current medications as prescribed.  You have been referred to [facility]. (still check in up front like you do for our office). They will contact you to schedule an appointment.  Your physician recommends that you schedule a follow-up appointment in: 6 weeks   If you have any questions or concerns before your next appointment please send [place] a message through [place] or call our office at [phone].    TO LEAVE A MESSAGE FOR THE NURSE SELECT OPTION 2, PLEASE LEAVE A MESSAGE INCLUDING: . YOUR NAME . DATE OF BIRTH . CALL BACK NUMBER . REASON FOR CALL**this is important as we prioritize the call backs  YOU WILL RECEIVE A CALL BACK THE SAME DAY AS LONG AS YOU CALL BEFORE 4:00 PM   Do the following things EVERYDAY: 1) Weigh yourself in the morning before breakfast. Write it down and keep it in a log. 2) Take your medicines as prescribed 3) Eat low salt foods--Limit salt (sodium) to 2000 mg per day.  4) Stay as active as you can everyday 5) Limit all fluids for the day to less than 2 liters   At the [facility], you and your health needs are our priority. As part of our continuing mission to provide you with exceptional heart care, we have created designated Provider Care Teams. These Care Teams include your primary Cardiologist (physician) and Advanced Practice Providers (APPs- Physician Assistants and Nurse Practitioners) who all work together to provide you with the care you need, when you need it.   You may see any of the following providers on your designated Care Team at your next follow up: Marland Kitchen Dr Glori Bickers . Dr Loralie Champagne . Darrick Grinder, NP . Lyda Jester, PA . Audry Riles, PharmD   Please be sure to bring in all your medications bottles to every appointment.

## 2020-05-10 ENCOUNTER — Other Ambulatory Visit: Payer: Self-pay

## 2020-05-10 ENCOUNTER — Ambulatory Visit (INDEPENDENT_AMBULATORY_CARE_PROVIDER_SITE_OTHER): Payer: Medicare PPO | Admitting: Internal Medicine

## 2020-05-10 DIAGNOSIS — I442 Atrioventricular block, complete: Secondary | ICD-10-CM | POA: Diagnosis not present

## 2020-05-10 NOTE — Progress Notes (Signed)
HPI [name] returns today for followup. He is a pleasant 70 yo man with a h/o CHB, s/p PPM who then developed pacing induced LBBB and worsening LV dysfunction and underwent insertion of a new LV lead in 2016 with removal of his old RV pacing lead. He was found to have worsening LV threshold and a chest xray demonstrated that his LV lead had dislodged into the CS. His CHF symptoms were initial well controlled but he has had gradual worsening. He has been under treatment under the direction of Dr. Aundra Dubin. He has developed worsening atrial fib though he does not have palpitations. He is pending dental work.  No Known Allergies   Current Outpatient Medications  Medication Sig Dispense Refill  . acetaminophen (TYLENOL) 325 MG tablet Take 1-2 tablets (325-650 mg total) by mouth every 4 (four) hours as needed for mild pain.    Marland Kitchen apixaban (ELIQUIS) 5 MG TABS tablet TAKE 1 TABLET BY MOUTH TWO TIMES DAILY 60 tablet 5  . bisoprolol (ZEBETA) 5 MG tablet TAKE 1 TABLET BY MOUTH DAILY. 30 tablet 1  . dapagliflozin propanediol (FARXIGA) 10 MG TABS tablet TAKE 1 TABLET BY MOUTH ONCE A DAY BEFORE BREAKFAST 30 tablet 11  . digoxin (LANOXIN) 0.125 MG tablet Take 1 tablet By Mouth Daily. 30 tablet 2  . furosemide (LASIX) 20 MG tablet TAKE 1 TABLET (20 MG TOTAL) 2 (TWO) TIMES DAILY BY MOUTH. 180 tablet 3  . hydrALAZINE (APRESOLINE) 25 MG tablet TAKE 1/2 TABLET BY MOUTH THREE TIMES DAILY 135 tablet 3  . isosorbide mononitrate (IMDUR) 30 MG 24 hr tablet Take 1 tablet (30 mg total) by mouth daily. 30 tablet 3  . meloxicam (MOBIC) 15 MG tablet TAKE 1 TABLET BY MOUTH ONCE DAILY. 14 tablet 0  . sacubitril-valsartan (ENTRESTO) 24-26 MG TAKE 1 TABLET BY MOUTH 2 TIMES DAILY. 180 tablet 3  . simvastatin (ZOCOR) 40 MG tablet TAKE 1 TABLET BY MOUTH EVERY DAY 90 tablet 3  . spironolactone (ALDACTONE) 25 MG tablet TAKE 1 TABLET BY MOUTH ONCE A DAY 90 tablet 1   No current facility-administered medications for this  visit.     Past Medical History:  Diagnosis Date  . AICD (automatic cardioverter/defibrillator) present 0919/2016   BIV   . Atrial flutter ([place])    a. 08/2014 presented w/ aflutter->converted on amio;  b. CHA2DS2VASc = 2 -->Xarelto.  . Atrial tachycardia ([place])   . Chronic combined systolic and diastolic CHF (congestive heart failure) ([place])    a. 03/2013 Echo EF 50-55%;  b. 07/2013 Echo EF 20-25%, diff HK, Gr3 DD;  c. 08/2014 Echo: EF 15%, diff HK, mild AI/MR, mildly dil LA/RV, mod TR, PASP 71mmHg.  Marland Kitchen DVT (deep venous thrombosis) ([place])    a. 2007 RLE: S/P ankle surgery;  b. 03/2013 LLE DVT & PE-->Xarelto.  . 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 2007 RLE: S/P ankle surgery;  b. 03/2013 LLE DVT & PE-->Xarelto.  . Hypercholesterolemia   . Hypercoagulable state ([place])   . Hypertension   . Musculoskeletal neck pain   . Nonischemic cardiomyopathy ([place])    a. 07/2013 Echo: EF 20-25%;  b. 07/2013 Myoview: Large inferior, lateral, apical scar w/ HK, no ischemia, EF 17%;  b. 08/2014 Echo: EF 15%, diff HK.  Marland Kitchen Pneumonia ~ 04/2014; 09/02/2014  . Presence of permanent cardiac pacemaker    a. 03/2013 s/p MDT ADDRL1 Adapta DC PPM, ser # [id number].  . Sleep apnea  does not wear mask (09/02/2014)  . Third degree heart block ([place])    a. 03/2013 s/p MDT ADDRL1 Adapta DC PPM, ser # [id number].    ROS:   All systems reviewed and negative except as noted in the HPI.   Past Surgical History:  Procedure Laterality Date  . CARDIAC CATHETERIZATION N/A 10/17/2014   Procedure: Right/Left Heart Cath and Coronary Angiography;  Surgeon: Larey Dresser, MD;  Location: [facility];  Service: Cardiovascular;  Laterality: N/A;  . ELECTROPHYSIOLOGIC STUDY N/A 09/07/2014   Procedure: A-Flutter;  Surgeon: Evans Lance, MD;  Location: [facility];  Service: Cardiovascular;  Laterality: N/A;  . EP IMPLANTABLE DEVICE  10/24/2014   BIV  . EP IMPLANTABLE DEVICE N/A 10/24/2014   Procedure: BiV ICD  Upgrade;  Surgeon: Evans Lance, MD;  Location: [facility];  Service: Cardiovascular;  Laterality: N/A;  . FOOT FRACTURE SURGERY Right 2007  . FRACTURE SURGERY    . INGUINAL HERNIA REPAIR Right 1980's  . INSERT / REPLACE / REMOVE PACEMAKER     MDT ADDRL1 pacemaker implanted by Dr Lovena Le for complete heart block  . PERMANENT PACEMAKER INSERTION N/A 03/15/2013   Procedure: PERMANENT PACEMAKER INSERTION;  Surgeon: Evans Lance, MD;  Location: [facility] C[facility];  Service: Cardiovascular;  Laterality: N/A;     Family History  Problem Relation Age of Onset  . Diabetes type II Mother   . Heart disease Father   . Arrhythmia Father   . Arrhythmia Sister   . Hypertension Neg Hx   . Heart attack Neg Hx   . Stroke Neg Hx      Social History   Socioeconomic History  . Marital status: Married    Spouse name: Not on file  . Number of children: Not on file  . Years of education: Not on file  . Highest education level: Not on file  Occupational History  . Not on file  Tobacco Use  . Smoking status: Former Smoker    Packs/day: 1.50    Years: 10.00    Pack years: 15.00    Types: Cigarettes  . Smokeless tobacco: Never Used  . Tobacco comment: 09/02/2014  "quit smoking 20-30 yr ago"  Vaping Use  . Vaping Use: Never used  Substance and Sexual Activity  . Alcohol use: No  . Drug use: No  . Sexual activity: Yes  Other Topics Concern  . Not on file  Social History Narrative  . Not on file   Social Determinants of Health   Financial Resource Strain: Not on file  Food Insecurity: Not on file  Transportation Needs: Not on file  Physical Activity: Not on file  Stress: Not on file  Social Connections: Not on file  Intimate Partner Violence: Not on file     BP (!) 80/60   Pulse 65   Ht 6\' 1"  (1.854 m)   Wt 207 lb (93.9 kg)   BMI 27.31 kg/m   Physical Exam:  Well appearing NAD HEENT: Unremarkable Neck:  No JVD, no thyromegally Lymphatics:  No adenopathy Back:  No CVA  tenderness Lungs:  Clear with no wheezes HEART:  Regular rate rhythm, no murmurs, no rubs, no clicks Abd:  soft, positive bowel sounds, no organomegally, no rebound, no guarding Ext:  2 plus pulses, no edema, no cyanosis, no clubbing Skin:  No rashes no nodules Neuro:  CN II through XII intact, motor grossly intact  DEVICE  Normal device function.  See PaceArt for details.   Assess/Plan: 1. Chronic systolic heart failure - we discussed the treatment options. I recommend proceeding with biv upgrade. He is on maximal medical therapy. 2. PAF - his atrial fib burden is increasing. I suspect he will require either dofetilide or amiodarone.  3. ICD- he has not had any ventricular arrhythmias. His device is working normally except that his LV lead is in the CS.  4. Coags - he has not had any bleeding on Eliqius. Continue.   Philip Overlie Antwuan Eckley,MD

## 2020-05-10 NOTE — Patient Instructions (Addendum)
Medication Instructions:  Your physician recommends that you continue on your current medications as directed. Please refer to the Current Medication list given to you today.  Labwork: None ordered.  Testing/Procedures: None ordered.  Follow-Up:  The following dates are available for this procedure:  June 20, 24 July 6, 18  Remote monitoring is used to monitor your ICD from home. This monitoring reduces the number of office visits required to check your device to one time per year. It allows [place] to keep an eye on the functioning of your device to ensure it is working properly. You are scheduled for a device check from home on 05/17/2020. You may send your transmission at any time that day. If you have a wireless device, the transmission will be sent automatically. After your physician reviews your transmission, you will receive a postcard with your next transmission date.  Any Other Special Instructions Will Be Listed Below (If Applicable).  If you need a refill on your cardiac medications before your next appointment, please call your pharmacy.

## 2020-05-12 ENCOUNTER — Other Ambulatory Visit (HOSPITAL_COMMUNITY): Payer: Self-pay

## 2020-05-16 ENCOUNTER — Other Ambulatory Visit (HOSPITAL_COMMUNITY): Payer: Self-pay

## 2020-05-16 MED FILL — Digoxin Tab 125 MCG (0.125 MG): ORAL | 30 days supply | Qty: 30 | Fill #0 | Status: AC

## 2020-05-16 MED FILL — Bisoprolol Fumarate Tab 5 MG: ORAL | 30 days supply | Qty: 30 | Fill #0 | Status: AC

## 2020-05-17 ENCOUNTER — Ambulatory Visit (INDEPENDENT_AMBULATORY_CARE_PROVIDER_SITE_OTHER): Payer: Medicare PPO

## 2020-05-17 ENCOUNTER — Other Ambulatory Visit (HOSPITAL_COMMUNITY): Payer: Self-pay

## 2020-05-17 DIAGNOSIS — I428 Other cardiomyopathies: Secondary | ICD-10-CM

## 2020-05-18 LAB — CUP PACEART REMOTE DEVICE CHECK
Battery Remaining Longevity: 17 mo
Battery Voltage: 2.88 V
Brady Statistic RA Percent Paced: 0 %
Brady Statistic RV Percent Paced: 99.34 %
Date Time Interrogation Session: 20220413072207
HighPow Impedance: 59 Ohm
Implantable Lead Implant Date: 20150209
Implantable Lead Implant Date: 20160919
Implantable Lead Implant Date: 20160919
Implantable Lead Location: 753858
Implantable Lead Location: 753859
Implantable Lead Location: 753860
Implantable Lead Model: 4598
Implantable Lead Model: 5076
Implantable Lead Model: 6935
Implantable Pulse Generator Implant Date: 20160919
Lead Channel Impedance Value: 266 Ohm
Lead Channel Impedance Value: 304 Ohm
Lead Channel Impedance Value: 323 Ohm
Lead Channel Impedance Value: 323 Ohm
Lead Channel Impedance Value: 342 Ohm
Lead Channel Impedance Value: 380 Ohm
Lead Channel Impedance Value: 380 Ohm
Lead Channel Impedance Value: 399 Ohm
Lead Channel Impedance Value: 513 Ohm
Lead Channel Impedance Value: 513 Ohm
Lead Channel Impedance Value: 513 Ohm
Lead Channel Impedance Value: 532 Ohm
Lead Channel Impedance Value: 570 Ohm
Lead Channel Pacing Threshold Amplitude: 0.875 V
Lead Channel Pacing Threshold Amplitude: 0.875 V
Lead Channel Pacing Threshold Amplitude: 2.5 V
Lead Channel Pacing Threshold Pulse Width: 0.4 ms
Lead Channel Pacing Threshold Pulse Width: 0.4 ms
Lead Channel Pacing Threshold Pulse Width: 0.4 ms
Lead Channel Sensing Intrinsic Amplitude: 12.125 mV
Lead Channel Sensing Intrinsic Amplitude: 14 mV
Lead Channel Sensing Intrinsic Amplitude: 3.5 mV
Lead Channel Sensing Intrinsic Amplitude: 3.5 mV
Lead Channel Setting Pacing Amplitude: 1.75 V
Lead Channel Setting Pacing Amplitude: 2 V
Lead Channel Setting Pacing Pulse Width: 0.4 ms
Lead Channel Setting Sensing Sensitivity: 0.3 mV

## 2020-05-22 ENCOUNTER — Ambulatory Visit (INDEPENDENT_AMBULATORY_CARE_PROVIDER_SITE_OTHER): Payer: Medicare PPO

## 2020-05-22 DIAGNOSIS — Z95810 Presence of automatic (implantable) cardiac defibrillator: Secondary | ICD-10-CM | POA: Diagnosis not present

## 2020-05-22 DIAGNOSIS — I5022 Chronic systolic (congestive) heart failure: Secondary | ICD-10-CM | POA: Diagnosis not present

## 2020-05-23 ENCOUNTER — Other Ambulatory Visit (HOSPITAL_COMMUNITY): Payer: Self-pay | Admitting: *Deleted

## 2020-05-23 DIAGNOSIS — I5022 Chronic systolic (congestive) heart failure: Secondary | ICD-10-CM

## 2020-05-24 ENCOUNTER — Other Ambulatory Visit: Payer: Self-pay

## 2020-05-24 ENCOUNTER — Ambulatory Visit (HOSPITAL_COMMUNITY)
Admission: RE | Admit: 2020-05-24 | Discharge: 2020-05-24 | Disposition: A | Discharge status: Home / Self Care | Payer: Medicare PPO | Source: Ambulatory Visit | Attending: Internal Medicine | Admitting: Internal Medicine

## 2020-05-24 DIAGNOSIS — I5022 Chronic systolic (congestive) heart failure: Secondary | ICD-10-CM | POA: Insufficient documentation

## 2020-05-24 LAB — DIGOXIN LEVEL: Digoxin Level: 0.4 ng/mL — ABNORMAL LOW (ref 0.8–2.0)

## 2020-05-24 NOTE — Progress Notes (Signed)
EPIC Encounter for ICM Monitoring  Patient Name: Philip Chavez is a 70 y.o. male Date: 05/24/2020 Primary Care Physican: Wilber Oliphant, MD Primary [place] Electrophysiologist: Lovena Le RV Pacing:99.5% 05/10/2020 Office Weight:207lbs  Clinical Status (17-May-2020 to 22-May-2020) Time in AT/AF 24.0 hr/day (100.0%) Longest AT/AF 18 days                                   Spoke with patient and reports feeling well at this time.  Denies fluid symptoms.   He has an appt with Afib clinic.  OptiVolThoracic impedancesuggesting normal fluid levels.Increased AT/AF Burden has been addressed by Dr Aundra Dubin and Dr Lovena Le.  Prescribed:Furosemide 20 mg 1 tablet twice a day.  Labs: 05/03/2020 Creatinine 1.38, BUN 11, Potassium 3.8, Sodium 139, GFR 55 02/16/2020 Creatinine 1.33, BUN 15, Potassium 3.8, Sodium 138, GFR 58 A complete set of results can be found in Results Review.  Recommendations:  No changes and encouraged to call if experiencing any fluid symptoms.  Follow-up plan: ICM clinic phone appointment on6/13/2022. 91 day device clinic remote transmission7/13/2022.   EP/Cardiology Office Visits: 5/16/2022with Dr.McLean.  Last EP visit 05/10/20 with Dr Lovena Le and no recalls for future appointments.   Copy of ICM check sent to Dr.Taylor.  3 month ICM trend: 05/22/2020.    1 Year ICM trend:       Rosalene Billings, RN 05/24/2020 8:30 AM

## 2020-05-28 ENCOUNTER — Other Ambulatory Visit (HOSPITAL_COMMUNITY): Payer: Self-pay | Admitting: Cardiology

## 2020-05-28 MED FILL — Apixaban Tab 5 MG: ORAL | 30 days supply | Qty: 60 | Fill #0 | Status: AC

## 2020-05-29 ENCOUNTER — Other Ambulatory Visit: Payer: Self-pay

## 2020-05-29 ENCOUNTER — Other Ambulatory Visit (HOSPITAL_COMMUNITY): Payer: Self-pay

## 2020-05-29 ENCOUNTER — Encounter (HOSPITAL_COMMUNITY): Payer: Self-pay | Admitting: Physician Assistant

## 2020-05-29 ENCOUNTER — Ambulatory Visit (HOSPITAL_COMMUNITY)
Admission: RE | Admit: 2020-05-29 | Discharge: 2020-05-29 | Disposition: A | Discharge status: Home / Self Care | Payer: Medicare PPO | Source: Ambulatory Visit | Attending: Physician Assistant | Admitting: Physician Assistant

## 2020-05-29 VITALS — BP 110/68 | HR 50 | Ht 73.0 in | Wt 204.4 lb

## 2020-05-29 DIAGNOSIS — Z8679 Personal history of other diseases of the circulatory system: Secondary | ICD-10-CM | POA: Insufficient documentation

## 2020-05-29 DIAGNOSIS — I5022 Chronic systolic (congestive) heart failure: Secondary | ICD-10-CM | POA: Insufficient documentation

## 2020-05-29 DIAGNOSIS — Z87891 Personal history of nicotine dependence: Secondary | ICD-10-CM | POA: Diagnosis not present

## 2020-05-29 DIAGNOSIS — I4819 Other persistent atrial fibrillation: Secondary | ICD-10-CM | POA: Diagnosis not present

## 2020-05-29 DIAGNOSIS — Z791 Long term (current) use of non-steroidal anti-inflammatories (NSAID): Secondary | ICD-10-CM | POA: Diagnosis not present

## 2020-05-29 DIAGNOSIS — I4892 Unspecified atrial flutter: Secondary | ICD-10-CM | POA: Insufficient documentation

## 2020-05-29 DIAGNOSIS — Z8249 Family history of ischemic heart disease and other diseases of the circulatory system: Secondary | ICD-10-CM | POA: Diagnosis not present

## 2020-05-29 DIAGNOSIS — Z7901 Long term (current) use of anticoagulants: Secondary | ICD-10-CM | POA: Insufficient documentation

## 2020-05-29 DIAGNOSIS — Z79899 Other long term (current) drug therapy: Secondary | ICD-10-CM | POA: Diagnosis not present

## 2020-05-29 DIAGNOSIS — D6869 Other thrombophilia: Secondary | ICD-10-CM | POA: Diagnosis not present

## 2020-05-29 MED ORDER — ENTRESTO 24-26 MG PO TABS
1.0000 | ORAL_TABLET | Freq: Two times a day (BID) | ORAL | 3 refills | Status: DC
  Filled 2020-05-29: qty 60, 30d supply, fill #0
  Filled 2020-06-28: qty 60, 30d supply, fill #1
  Filled 2020-07-31: qty 60, 30d supply, fill #2

## 2020-05-29 NOTE — Progress Notes (Signed)
Primary Care Physician: Wilber Oliphant, MD Primary Cardiologist: Dr Aundra Dubin Primary Electrophysiologist: Dr Lovena Le Referring Physician: Dr Aundra Dubin   Deric Bocock is a 70 y.o. male with a history of complete heart block/[employer]tronic PPM, chronic systolic CHF, atrial flutter s/p ablation, atrial fibrillation who presents for follow up in the [facility]. Patient is on Eliquis for a CHADS2VASC score of 2. He has had increasing burden of afib noted on his device interrogation. He is unaware of his arrhythmia. He denies any symptoms of fluid overload, Optivol shows normal fluid levels. Of note, he is pending a BiV upgrade with Dr Lovena Le.   Today, he denies symptoms of palpitations, chest pain, shortness of breath, orthopnea, PND, lower extremity edema, dizziness, presyncope, syncope, snoring, daytime somnolence, bleeding, or neurologic sequela. The patient is tolerating medications without difficulties and is otherwise without complaint today.    Atrial Fibrillation Risk Factors:  he does not have symptoms or diagnosis of sleep apnea. he does not have a history of rheumatic fever.   he has a BMI of Body mass index is 26.97 kg/m.Marland Kitchen Filed Weights   05/29/20 0938  Weight: 92.7 kg    Family History  Problem Relation Age of Onset  . Diabetes type II Mother   . Heart disease Father   . Arrhythmia Father   . Arrhythmia Sister   . Hypertension Neg Hx   . Heart attack Neg Hx   . Stroke Neg Hx      Atrial Fibrillation Management history:  Previous antiarrhythmic drugs: none Previous cardioversions: none Previous ablations: 2016 flutter CHADS2VASC score: 2 Anticoagulation history: Eliquis   Past Medical History:  Diagnosis Date  . AICD (automatic cardioverter/defibrillator) present 0919/2016   BIV   . Atrial flutter ([place])    a. 08/2014 presented w/ aflutter->converted on amio;  b. CHA2DS2VASc = 2 -->Xarelto.  . Atrial tachycardia ([place])   . Chronic  combined systolic and diastolic CHF (congestive heart failure) ([place])    a. 03/2013 Echo EF 50-55%;  b. 07/2013 Echo EF 20-25%, diff HK, Gr3 DD;  c. 08/2014 Echo: EF 15%, diff HK, mild AI/MR, mildly dil LA/RV, mod TR, PASP 42mmHg.  Marland Kitchen DVT (deep venous thrombosis) ([place])    a. 2007 RLE: S/P ankle surgery;  b. 03/2013 LLE DVT & PE-->Xarelto.  . Embolism, pulmonary with infarction ([place])    a. 2007 RLE: S/P ankle surgery;  b. 03/2013 LLE DVT & PE-->Xarelto.  . Hypercholesterolemia   . Hypercoagulable state ([place])   . Hypertension   . Musculoskeletal neck pain   . Nonischemic cardiomyopathy ([place])    a. 07/2013 Echo: EF 20-25%;  b. 07/2013 Myoview: Large inferior, lateral, apical scar w/ HK, no ischemia, EF 17%;  b. 08/2014 Echo: EF 15%, diff HK.  Marland Kitchen Pneumonia ~ 04/2014; 09/02/2014  . Presence of permanent cardiac pacemaker    a. 03/2013 s/p MDT ADDRL1 Adapta DC PPM, ser # [id number].  . Sleep apnea    does not wear mask (09/02/2014)  . Third degree heart block ([place])    a. 03/2013 s/p MDT ADDRL1 Adapta DC PPM, ser # [id number].   Past Surgical History:  Procedure Laterality Date  . CARDIAC CATHETERIZATION N/A 10/17/2014   Procedure: Right/Left Heart Cath and Coronary Angiography;  Surgeon: Larey Dresser, MD;  Location: [facility];  Service: Cardiovascular;  Laterality: N/A;  . ELECTROPHYSIOLOGIC STUDY N/A 09/07/2014   Procedure: A-Flutter;  Surgeon: Evans Lance, MD;  Location: [facility];  Service: Cardiovascular;  Laterality: N/A;  . EP IMPLANTABLE DEVICE  10/24/2014   BIV  . EP IMPLANTABLE DEVICE N/A 10/24/2014   Procedure: BiV ICD Upgrade;  Surgeon: Evans Lance, MD;  Location: [facility];  Service: Cardiovascular;  Laterality: N/A;  . FOOT FRACTURE SURGERY Right 2007  . FRACTURE SURGERY    . INGUINAL HERNIA REPAIR Right 1980's  . INSERT / REPLACE / REMOVE PACEMAKER     MDT ADDRL1 pacemaker implanted by Dr Lovena Le for complete heart block  . PERMANENT PACEMAKER INSERTION N/A  03/15/2013   Procedure: PERMANENT PACEMAKER INSERTION;  Surgeon: Evans Lance, MD;  Location: [facility] CATH LAB;  Service: Cardiovascular;  Laterality: N/A;    Current Outpatient Medications  Medication Sig Dispense Refill  . acetaminophen (TYLENOL) 325 MG tablet Take 1-2 tablets (325-650 mg total) by mouth every 4 (four) hours as needed for mild pain.    Marland Kitchen apixaban (ELIQUIS) 5 MG TABS tablet TAKE 1 TABLET BY MOUTH TWO TIMES DAILY 60 tablet 5  . bisoprolol (ZEBETA) 5 MG tablet TAKE 1 TABLET BY MOUTH DAILY. 30 tablet 1  . dapagliflozin propanediol (FARXIGA) 10 MG TABS tablet TAKE 1 TABLET BY MOUTH ONCE A DAY BEFORE BREAKFAST 30 tablet 11  . digoxin (LANOXIN) 0.125 MG tablet Take 1 tablet By Mouth Daily. 30 tablet 2  . furosemide (LASIX) 20 MG tablet TAKE 1 TABLET (20 MG TOTAL) 2 (TWO) TIMES DAILY BY MOUTH. 180 tablet 3  . hydrALAZINE (APRESOLINE) 25 MG tablet TAKE 1/2 TABLET BY MOUTH THREE TIMES DAILY 135 tablet 3  . isosorbide mononitrate (IMDUR) 30 MG 24 hr tablet Take 1 tablet (30 mg total) by mouth daily. 30 tablet 3  . meloxicam (MOBIC) 15 MG tablet TAKE 1 TABLET BY MOUTH ONCE DAILY. 14 tablet 0  . sacubitril-valsartan (ENTRESTO) 24-26 MG TAKE 1 TABLET BY MOUTH 2 TIMES DAILY. 60 tablet 3  . simvastatin (ZOCOR) 40 MG tablet TAKE 1 TABLET BY MOUTH EVERY DAY 90 tablet 3  . spironolactone (ALDACTONE) 25 MG tablet TAKE 1 TABLET BY MOUTH ONCE A DAY 90 tablet 1   No current facility-administered medications for this encounter.    No Known Allergies  Social History   Socioeconomic History  . Marital status: Married    Spouse name: Not on file  . Number of children: Not on file  . Years of education: Not on file  . Highest education level: Not on file  Occupational History  . Not on file  Tobacco Use  . Smoking status: Former Smoker    Packs/day: 1.50    Years: 10.00    Pack years: 15.00    Types: Cigarettes  . Smokeless tobacco: Never Used  . Tobacco comment: 09/02/2014  "quit smoking  20-30 yr ago"  Vaping Use  . Vaping Use: Never used  Substance and Sexual Activity  . Alcohol use: No  . Drug use: No  . Sexual activity: Yes  Other Topics Concern  . Not on file  Social History Narrative  . Not on file   Social Determinants of Health   Financial Resource Strain: Not on file  Food Insecurity: Not on file  Transportation Needs: Not on file  Physical Activity: Not on file  Stress: Not on file  Social Connections: Not on file  Intimate Partner Violence: Not on file     ROS- All systems are reviewed and negative except as per the HPI above.  Physical Exam: Vitals:   05/29/20 0938  BP:  110/68  Pulse: (!) 50  Weight: 92.7 kg  Height: 6\' 1"  (1.854 m)    GEN- The patient is a well appearing male, alert and oriented x 3 today. Head- normocephalic, atraumatic Eyes-  Sclera clear, conjunctiva pink Ears- hearing intact Oropharynx- clear Neck- supple  Lungs- Clear to ausculation bilaterally, normal work of breathing Heart- Regular rate and rhythm, bradycardia, no murmurs, rubs or gallops  GI- soft, NT, ND, + BS Extremities- no clubbing, cyanosis, or edema MS- no significant deformity or atrophy Skin- no rash or lesion Psych- euthymic mood, full affect Neuro- strength and sensation are intact  Wt Readings from Last 3 Encounters:  05/29/20 92.7 kg  05/10/20 93.9 kg  05/03/20 94.3 kg    EKG today demonstrates  V paced rhythm with underlying afib Vent. rate 50 BPM PR interval * ms QRS duration 156 ms QT/QTcB 438/399 ms  Echo 02/02/20 demonstrated  1. Left ventricular ejection fraction, by estimation, is <20%. The left  ventricle has severely decreased function. The left ventricle demonstrates  global hypokinesis. The left ventricular internal cavity size was  moderately dilated. There is mild left  ventricular hypertrophy. Left ventricular diastolic parameters are  consistent with Grade II diastolic dysfunction (pseudonormalization).  2. Right  ventricular systolic function is mildly reduced. The right  ventricular size is normal. There is normal pulmonary artery systolic  pressure. The estimated right ventricular systolic pressure is 93.7 mmHg.  3. Left atrial size was moderately dilated.  4. The mitral valve is normal in structure. Trivial mitral valve  regurgitation. No evidence of mitral stenosis.  5. The aortic valve is tricuspid. Aortic valve regurgitation is trivial.  No aortic stenosis is present.  6. Aortic dilatation noted. There is mild dilatation of the aortic root,  measuring 41 mm.  7. The inferior vena cava is dilated in size with >50% respiratory  variability, suggesting right atrial pressure of 8 mmHg.   Epic records are reviewed at length today  CHA2DS2-VASc Score = 2  The patient's score is based upon: CHF History: Yes HTN History: No Diabetes History: No Stroke History: No Vascular Disease History: No Age Score: 1 Gender Score: 0      ASSESSMENT AND PLAN: 1. Persistent Atrial Fibrillation/atrial flutter The patient's CHA2DS2-VASc score is 2, indicating a 2.2% annual risk of stroke.   We discussed therapeutic options today including dofetilide and amiodarone.  After discussing the risks and benefits of each medication, he is unsure he wants to proceed with either medication. He would like to discuss again with Dr Aundra Dubin at his next visit. Would favor dofetilide admission to avoid off target effects with amiodarone.  Information about dofetilide admission given.  Patient to check on price of medication  Continue Eliquis 5 mg BID PharmD to screen medications QTc in SR 515 ms (444 ms when corrected for V pacing)  2. Secondary Hypercoagulable State (ICD10:  D68.69) The patient is at significant risk for stroke/thromboembolism based upon his CHA2DS2-VASc Score of 2.  Continue Apixaban (Eliquis).   3. Chronic systolic CHF No signs or symptoms of fluid overload today. Pending upgrade to BiV pacing  with Dr Lovena Le.  4. CHB S/p PPM, followed by Dr Lovena Le and the device clinic.   Follow up with Dr Aundra Dubin as scheduled. AF clinic pending decision about AAD.    [facility] [address] [place], [place] [address] [phone] 05/29/2020 9:49 AM

## 2020-05-30 ENCOUNTER — Ambulatory Visit (HOSPITAL_COMMUNITY): Payer: Medicare PPO | Admitting: Physician Assistant

## 2020-05-31 NOTE — Progress Notes (Signed)
Remote ICD transmission.   

## 2020-06-02 ENCOUNTER — Other Ambulatory Visit (HOSPITAL_COMMUNITY): Payer: Self-pay

## 2020-06-02 MED FILL — Dapagliflozin Propanediol Tab 10 MG (Base Equivalent): ORAL | 30 days supply | Qty: 30 | Fill #0 | Status: AC

## 2020-06-03 ENCOUNTER — Other Ambulatory Visit (HOSPITAL_COMMUNITY): Payer: Self-pay

## 2020-06-06 ENCOUNTER — Encounter (HOSPITAL_COMMUNITY): Payer: Self-pay | Admitting: *Deleted

## 2020-06-06 ENCOUNTER — Telehealth: Payer: Self-pay | Admitting: Pharmacist

## 2020-06-06 NOTE — Telephone Encounter (Addendum)
Medication list reviewed in anticipation of upcoming Tikosyn initiation. Patient is not taking any contraindicated or QTc prolonging medications.   Patient is anticoagulated on Eliquis on the appropriate dose. Please ensure that patient has not missed any anticoagulation doses in the 3 weeks prior to Tikosyn initiation.   Patient will need to be counseled to avoid use of Benadryl while on Tikosyn and in the 2-3 days prior to Tikosyn initiation.  Patient will likely need potassium supplementation to target K>4.

## 2020-06-07 ENCOUNTER — Other Ambulatory Visit (HOSPITAL_COMMUNITY): Payer: Self-pay

## 2020-06-07 ENCOUNTER — Other Ambulatory Visit (HOSPITAL_COMMUNITY): Payer: Self-pay | Admitting: Internal Medicine

## 2020-06-08 ENCOUNTER — Telehealth: Payer: Self-pay | Admitting: Internal Medicine

## 2020-06-08 ENCOUNTER — Other Ambulatory Visit (HOSPITAL_COMMUNITY): Payer: Self-pay | Admitting: Family Medicine

## 2020-06-08 ENCOUNTER — Other Ambulatory Visit (HOSPITAL_COMMUNITY): Payer: Self-pay

## 2020-06-08 MED ORDER — HYDRALAZINE HCL 25 MG PO TABS
ORAL_TABLET | Freq: Three times a day (TID) | ORAL | 3 refills | Status: DC
Start: 2020-06-08 — End: 2020-09-13
  Filled 2020-06-08: qty 135, 90d supply, fill #0

## 2020-06-08 NOTE — Telephone Encounter (Signed)
Spoke to patient and he needs his Hydralazine refilled.  Would like it sent to [facility] outpatient pharmacy.

## 2020-06-08 NOTE — Telephone Encounter (Signed)
Patient called in with question about medication hydrALAZINE (APRESOLINE) 25 MG tablet(Expired), rather he needs to get a refill or not and if its apart of his heart medication

## 2020-06-14 ENCOUNTER — Other Ambulatory Visit (HOSPITAL_COMMUNITY): Payer: Self-pay | Admitting: Cardiology

## 2020-06-14 ENCOUNTER — Other Ambulatory Visit (HOSPITAL_COMMUNITY): Payer: Self-pay

## 2020-06-14 MED ORDER — BISOPROLOL FUMARATE 5 MG PO TABS
ORAL_TABLET | Freq: Every day | ORAL | 11 refills | Status: DC
  Filled 2020-06-14: qty 30, 30d supply, fill #0
  Filled 2020-07-16: qty 30, 30d supply, fill #1

## 2020-06-14 MED ORDER — DIGOXIN 125 MCG PO TABS
ORAL_TABLET | Freq: Every day | ORAL | 11 refills | Status: DC
  Filled 2020-06-14: qty 30, 30d supply, fill #0
  Filled 2020-07-15: qty 30, 30d supply, fill #1

## 2020-06-19 ENCOUNTER — Encounter (HOSPITAL_COMMUNITY): Payer: Medicare PPO | Admitting: Cardiology

## 2020-06-28 ENCOUNTER — Other Ambulatory Visit (HOSPITAL_COMMUNITY): Payer: Self-pay

## 2020-06-28 MED FILL — Apixaban Tab 5 MG: ORAL | 30 days supply | Qty: 60 | Fill #1 | Status: AC

## 2020-06-29 ENCOUNTER — Other Ambulatory Visit (HOSPITAL_COMMUNITY): Payer: Self-pay

## 2020-07-04 ENCOUNTER — Other Ambulatory Visit (HOSPITAL_COMMUNITY): Payer: Self-pay

## 2020-07-04 MED FILL — Dapagliflozin Propanediol Tab 10 MG (Base Equivalent): ORAL | 30 days supply | Qty: 30 | Fill #1 | Status: AC

## 2020-07-11 ENCOUNTER — Other Ambulatory Visit: Payer: Self-pay | Admitting: Cardiology

## 2020-07-11 DIAGNOSIS — I5022 Chronic systolic (congestive) heart failure: Secondary | ICD-10-CM

## 2020-07-15 ENCOUNTER — Other Ambulatory Visit (HOSPITAL_COMMUNITY): Payer: Self-pay

## 2020-07-17 ENCOUNTER — Ambulatory Visit (INDEPENDENT_AMBULATORY_CARE_PROVIDER_SITE_OTHER): Payer: Medicare PPO

## 2020-07-17 ENCOUNTER — Other Ambulatory Visit (HOSPITAL_COMMUNITY): Payer: Self-pay

## 2020-07-17 DIAGNOSIS — Z95810 Presence of automatic (implantable) cardiac defibrillator: Secondary | ICD-10-CM

## 2020-07-17 DIAGNOSIS — I5022 Chronic systolic (congestive) heart failure: Secondary | ICD-10-CM

## 2020-07-18 NOTE — Progress Notes (Signed)
EPIC Encounter for ICM Monitoring  Patient Name: Philip Chavez is a 69 y.o. male Date: 07/18/2020 Primary Care Physican: Wilber Oliphant, MD Primary Cardiologist: Aundra Dubin Electrophysiologist: Lovena Le RV Pacing:  99.2%        07/19/2020 Weight: 203-205 lbs   Clinical Status (22-May-2020 to 17-Jul-2020) Time in AT/AF 24.0 hr/day (100.0%) Longest AT/AF 74 days                                   Spoke with patient and reports feeling well at this time. Heart failure questions reviewed. Pt asymptomatic.   OptiVol Thoracic impedance suggesting normal fluid levels.  Increased AT/AF Burden has been addressed by Dr Aundra Dubin and Dr Lovena Le.   Prescribed: Furosemide 20 mg 1 tablet twice a day.   Labs: 05/03/2020 Creatinine 1.38, BUN 11, Potassium 3.8, Sodium 139, GFR 55 02/16/2020 Creatinine 1.33, BUN 15, Potassium 3.8, Sodium 138, GFR 58 A complete set of results can be found in Results Review.   Recommendations:  No changes and encouraged to call if experiencing any fluid symptoms.   Follow-up plan: ICM clinic phone appointment on 08/21/2020.   91 day device clinic remote transmission 08/16/2020.     EP/Cardiology Office Visits:  07/24/2020 with Afib clinic.   Last EP visit 05/10/20 with Dr Lovena Le and no recalls for future appointments.    Copy of ICM check sent to Dr. Lovena Le.   3 month ICM trend: 07/17/2020.    1 Year ICM trend:       Rosalene Billings, RN 07/18/2020 11:32 AM

## 2020-07-21 ENCOUNTER — Other Ambulatory Visit (HOSPITAL_COMMUNITY)
Admission: RE | Admit: 2020-07-21 | Discharge: 2020-07-21 | Disposition: A | Discharge status: Home / Self Care | Payer: Medicare PPO | Source: Ambulatory Visit | Attending: Physician Assistant | Admitting: Physician Assistant

## 2020-07-21 DIAGNOSIS — I110 Hypertensive heart disease with heart failure: Secondary | ICD-10-CM | POA: Diagnosis not present

## 2020-07-21 DIAGNOSIS — Z79899 Other long term (current) drug therapy: Secondary | ICD-10-CM | POA: Diagnosis not present

## 2020-07-21 DIAGNOSIS — Z01812 Encounter for preprocedural laboratory examination: Secondary | ICD-10-CM | POA: Insufficient documentation

## 2020-07-21 DIAGNOSIS — I130 Hypertensive heart and chronic kidney disease with heart failure and stage 1 through stage 4 chronic kidney disease, or unspecified chronic kidney disease: Secondary | ICD-10-CM | POA: Diagnosis not present

## 2020-07-21 DIAGNOSIS — Z833 Family history of diabetes mellitus: Secondary | ICD-10-CM | POA: Diagnosis not present

## 2020-07-21 DIAGNOSIS — N183 Chronic kidney disease, stage 3 unspecified: Secondary | ICD-10-CM | POA: Diagnosis not present

## 2020-07-21 DIAGNOSIS — Z20822 Contact with and (suspected) exposure to covid-19: Secondary | ICD-10-CM | POA: Diagnosis not present

## 2020-07-21 DIAGNOSIS — I428 Other cardiomyopathies: Secondary | ICD-10-CM | POA: Diagnosis not present

## 2020-07-21 DIAGNOSIS — Z87891 Personal history of nicotine dependence: Secondary | ICD-10-CM | POA: Diagnosis not present

## 2020-07-21 DIAGNOSIS — I5042 Chronic combined systolic (congestive) and diastolic (congestive) heart failure: Secondary | ICD-10-CM | POA: Diagnosis not present

## 2020-07-21 DIAGNOSIS — Z95810 Presence of automatic (implantable) cardiac defibrillator: Secondary | ICD-10-CM | POA: Diagnosis not present

## 2020-07-21 DIAGNOSIS — D6869 Other thrombophilia: Secondary | ICD-10-CM | POA: Diagnosis not present

## 2020-07-21 DIAGNOSIS — Z7901 Long term (current) use of anticoagulants: Secondary | ICD-10-CM | POA: Diagnosis not present

## 2020-07-21 DIAGNOSIS — Z8249 Family history of ischemic heart disease and other diseases of the circulatory system: Secondary | ICD-10-CM | POA: Diagnosis not present

## 2020-07-21 DIAGNOSIS — I4819 Other persistent atrial fibrillation: Secondary | ICD-10-CM | POA: Diagnosis not present

## 2020-07-21 DIAGNOSIS — E7800 Pure hypercholesterolemia, unspecified: Secondary | ICD-10-CM | POA: Diagnosis present

## 2020-07-21 DIAGNOSIS — I442 Atrioventricular block, complete: Secondary | ICD-10-CM | POA: Diagnosis not present

## 2020-07-21 DIAGNOSIS — Z86718 Personal history of other venous thrombosis and embolism: Secondary | ICD-10-CM | POA: Diagnosis not present

## 2020-07-21 DIAGNOSIS — I4892 Unspecified atrial flutter: Secondary | ICD-10-CM | POA: Diagnosis not present

## 2020-07-21 LAB — SARS CORONAVIRUS 2 (TAT 6-24 HRS): SARS Coronavirus 2: NEGATIVE

## 2020-07-24 ENCOUNTER — Encounter (HOSPITAL_COMMUNITY): Payer: Self-pay | Admitting: Physician Assistant

## 2020-07-24 ENCOUNTER — Other Ambulatory Visit (HOSPITAL_COMMUNITY): Payer: Self-pay

## 2020-07-24 ENCOUNTER — Ambulatory Visit (HOSPITAL_COMMUNITY)
Admission: RE | Admit: 2020-07-24 | Discharge: 2020-07-24 | Disposition: A | Discharge status: Home / Self Care | Payer: Medicare PPO | Source: Ambulatory Visit | Attending: Physician Assistant | Admitting: Physician Assistant

## 2020-07-24 ENCOUNTER — Inpatient Hospital Stay (HOSPITAL_COMMUNITY)
Admission: AD | Admit: 2020-07-24 | Discharge: 2020-07-27 | DRG: 309 | Disposition: A | Discharge status: Home / Self Care | Payer: Medicare PPO | Source: Ambulatory Visit | Attending: Internal Medicine | Admitting: Internal Medicine

## 2020-07-24 ENCOUNTER — Other Ambulatory Visit: Payer: Self-pay

## 2020-07-24 VITALS — BP 92/68 | HR 65 | Ht 73.0 in | Wt 203.2 lb

## 2020-07-24 DIAGNOSIS — Z7901 Long term (current) use of anticoagulants: Secondary | ICD-10-CM

## 2020-07-24 DIAGNOSIS — D6869 Other thrombophilia: Secondary | ICD-10-CM

## 2020-07-24 DIAGNOSIS — Z8249 Family history of ischemic heart disease and other diseases of the circulatory system: Secondary | ICD-10-CM | POA: Diagnosis not present

## 2020-07-24 DIAGNOSIS — Z86718 Personal history of other venous thrombosis and embolism: Secondary | ICD-10-CM | POA: Diagnosis not present

## 2020-07-24 DIAGNOSIS — Z833 Family history of diabetes mellitus: Secondary | ICD-10-CM

## 2020-07-24 DIAGNOSIS — Z79899 Other long term (current) drug therapy: Secondary | ICD-10-CM | POA: Diagnosis not present

## 2020-07-24 DIAGNOSIS — Z95810 Presence of automatic (implantable) cardiac defibrillator: Secondary | ICD-10-CM

## 2020-07-24 DIAGNOSIS — I442 Atrioventricular block, complete: Secondary | ICD-10-CM | POA: Diagnosis present

## 2020-07-24 DIAGNOSIS — I4819 Other persistent atrial fibrillation: Secondary | ICD-10-CM | POA: Diagnosis present

## 2020-07-24 DIAGNOSIS — I5042 Chronic combined systolic (congestive) and diastolic (congestive) heart failure: Secondary | ICD-10-CM | POA: Diagnosis present

## 2020-07-24 DIAGNOSIS — I428 Other cardiomyopathies: Secondary | ICD-10-CM | POA: Diagnosis present

## 2020-07-24 DIAGNOSIS — I4892 Unspecified atrial flutter: Secondary | ICD-10-CM | POA: Diagnosis present

## 2020-07-24 DIAGNOSIS — I110 Hypertensive heart disease with heart failure: Secondary | ICD-10-CM | POA: Diagnosis present

## 2020-07-24 DIAGNOSIS — E7800 Pure hypercholesterolemia, unspecified: Secondary | ICD-10-CM | POA: Diagnosis present

## 2020-07-24 DIAGNOSIS — Z20822 Contact with and (suspected) exposure to covid-19: Secondary | ICD-10-CM | POA: Diagnosis present

## 2020-07-24 DIAGNOSIS — Z87891 Personal history of nicotine dependence: Secondary | ICD-10-CM | POA: Diagnosis not present

## 2020-07-24 LAB — MAGNESIUM: Magnesium: 2.1 mg/dL (ref 1.7–2.4)

## 2020-07-24 LAB — HIV ANTIBODY (ROUTINE TESTING W REFLEX): HIV Screen 4th Generation wRfx: NONREACTIVE

## 2020-07-24 LAB — BASIC METABOLIC PANEL
Anion gap: 8 (ref 5–15)
BUN: 16 mg/dL (ref 8–23)
CO2: 26 mmol/L (ref 22–32)
Calcium: 9.2 mg/dL (ref 8.9–10.3)
Chloride: 104 mmol/L (ref 98–111)
Creatinine, Ser: 1.41 mg/dL — ABNORMAL HIGH (ref 0.61–1.24)
GFR, Estimated: 54 mL/min — ABNORMAL LOW (ref >60)
Glucose, Bld: 97 mg/dL (ref 70–99)
Potassium: 3.9 mmol/L (ref 3.5–5.1)
Sodium: 138 mmol/L (ref 135–145)

## 2020-07-24 MED ORDER — SIMVASTATIN 20 MG PO TABS
40.0000 mg | ORAL_TABLET | Freq: Every day | ORAL | Status: DC
  Administered 2020-07-25 – 2020-07-26 (×2): 40 mg via ORAL
  Filled 2020-07-24 (×2): qty 2

## 2020-07-24 MED ORDER — FUROSEMIDE 20 MG PO TABS
20.0000 mg | ORAL_TABLET | Freq: Two times a day (BID) | ORAL | Status: DC
  Administered 2020-07-25 – 2020-07-27 (×5): 20 mg via ORAL
  Filled 2020-07-24 (×5): qty 1

## 2020-07-24 MED ORDER — APIXABAN 5 MG PO TABS
5.0000 mg | ORAL_TABLET | Freq: Two times a day (BID) | ORAL | Status: DC
  Administered 2020-07-25 – 2020-07-27 (×5): 5 mg via ORAL
  Filled 2020-07-24 (×5): qty 1

## 2020-07-24 MED ORDER — SACUBITRIL-VALSARTAN 24-26 MG PO TABS
1.0000 | ORAL_TABLET | Freq: Two times a day (BID) | ORAL | Status: DC
  Administered 2020-07-25 – 2020-07-27 (×5): 1 via ORAL
  Filled 2020-07-24 (×5): qty 1

## 2020-07-24 MED ORDER — DIGOXIN 125 MCG PO TABS
0.1250 mg | ORAL_TABLET | Freq: Every day | ORAL | Status: DC
  Administered 2020-07-25 – 2020-07-27 (×3): 0.125 mg via ORAL
  Filled 2020-07-24 (×3): qty 1

## 2020-07-24 MED ORDER — POTASSIUM CHLORIDE CRYS ER 20 MEQ PO TBCR
40.0000 meq | EXTENDED_RELEASE_TABLET | Freq: Once | ORAL | Status: AC
  Administered 2020-07-24: 40 meq via ORAL
  Filled 2020-07-24: qty 2

## 2020-07-24 MED ORDER — ACETAMINOPHEN 325 MG PO TABS
325.0000 mg | ORAL_TABLET | ORAL | Status: DC | PRN
Start: 2020-07-24 — End: 2020-07-27

## 2020-07-24 MED ORDER — DOFETILIDE 500 MCG PO CAPS: 500.0000 ug | ORAL_CAPSULE | Freq: Two times a day (BID) | ORAL | Status: DC

## 2020-07-24 MED ORDER — HYDRALAZINE HCL 25 MG PO TABS
12.5000 mg | ORAL_TABLET | Freq: Three times a day (TID) | ORAL | Status: DC
  Administered 2020-07-25 – 2020-07-27 (×7): 12.5 mg via ORAL
  Filled 2020-07-24 (×7): qty 1

## 2020-07-24 MED ORDER — BISOPROLOL FUMARATE 5 MG PO TABS
5.0000 mg | ORAL_TABLET | Freq: Every day | ORAL | Status: DC
  Administered 2020-07-25 – 2020-07-27 (×3): 5 mg via ORAL
  Filled 2020-07-24 (×3): qty 1

## 2020-07-24 MED ORDER — ISOSORBIDE MONONITRATE ER 30 MG PO TB24
30.0000 mg | ORAL_TABLET | Freq: Every day | ORAL | Status: DC
  Administered 2020-07-25 – 2020-07-27 (×3): 30 mg via ORAL
  Filled 2020-07-24 (×3): qty 1

## 2020-07-24 MED ORDER — SPIRONOLACTONE 25 MG PO TABS
25.0000 mg | ORAL_TABLET | Freq: Every day | ORAL | Status: DC
  Administered 2020-07-25 – 2020-07-27 (×3): 25 mg via ORAL
  Filled 2020-07-24 (×3): qty 1

## 2020-07-24 MED ORDER — SODIUM CHLORIDE 0.9 % IV SOLN: 250.0000 mL | INTRAVENOUS | Status: DC | PRN

## 2020-07-24 MED ORDER — SODIUM CHLORIDE 0.9% FLUSH
3.0000 mL | Freq: Two times a day (BID) | INTRAVENOUS | Status: DC
  Administered 2020-07-25 – 2020-07-27 (×4): 3 mL via INTRAVENOUS

## 2020-07-24 MED ORDER — DAPAGLIFLOZIN PROPANEDIOL 10 MG PO TABS
10.0000 mg | ORAL_TABLET | Freq: Every day | ORAL | Status: DC
  Administered 2020-07-26 – 2020-07-27 (×2): 10 mg via ORAL
  Filled 2020-07-24 (×2): qty 1

## 2020-07-24 MED ORDER — SODIUM CHLORIDE 0.9% FLUSH: 3.0000 mL | INTRAVENOUS | Status: DC | PRN

## 2020-07-24 MED ORDER — DOFETILIDE 250 MCG PO CAPS
250.0000 ug | ORAL_CAPSULE | Freq: Two times a day (BID) | ORAL | Status: DC
  Administered 2020-07-24: 250 ug via ORAL
  Filled 2020-07-24: qty 1

## 2020-07-24 MED ORDER — POTASSIUM CHLORIDE 20 MEQ PO PACK: 40.0000 meq | PACK | Freq: Once | ORAL | Status: AC

## 2020-07-24 NOTE — Progress Notes (Signed)
Primary Care Physician: Wilber Oliphant, MD Primary Cardiologist: Dr Aundra Dubin Primary Electrophysiologist: Dr Lovena Le Referring Physician: Dr Aundra Dubin   Philip Chavez is a 70 y.o. male with a history of complete heart block/Medtronic PPM, chronic systolic CHF, atrial flutter s/p ablation, atrial fibrillation who presents for follow up in the [facility]. Patient is on Eliquis for a CHADS2VASC score of 2. He has had increasing burden of afib noted on his device interrogation. He is unaware of his arrhythmia. He denies any symptoms of fluid overload, Optivol shows normal fluid levels. Of note, he is pending a BiV upgrade with Dr Lovena Le.   On follow up today, patient presents for dofetilide admission. He remains in rate controlled afib today. He denies any missed doses of anticoagulation in the last 3 weeks.   Today, he denies symptoms of palpitations, chest pain, shortness of breath, orthopnea, PND, lower extremity edema, dizziness, presyncope, syncope, snoring, daytime somnolence, bleeding, or neurologic sequela. The patient is tolerating medications without difficulties and is otherwise without complaint today.    Atrial Fibrillation Risk Factors:  he does not have symptoms or diagnosis of sleep apnea. he does not have a history of rheumatic fever.   he has a BMI of Body mass index is 26.81 kg/m.Marland Kitchen Filed Weights   07/24/20 1139  Weight: 92.2 kg     Family History  Problem Relation Age of Onset   Diabetes type II Mother    Heart disease Father    Arrhythmia Father    Arrhythmia Sister    Hypertension Neg Hx    Heart attack Neg Hx    Stroke Neg Hx      Atrial Fibrillation Management history:  Previous antiarrhythmic drugs: none Previous cardioversions: none Previous ablations: 2016 flutter CHADS2VASC score: 2 Anticoagulation history: Eliquis   Past Medical History:  Diagnosis Date   AICD (automatic cardioverter/defibrillator) present 0919/2016    BIV    Atrial flutter ([place])    a. 08/2014 presented w/ aflutter->converted on amio;  b. CHA2DS2VASc = 2 -->Xarelto.   Atrial tachycardia (HCC)    Chronic combined systolic and diastolic CHF (congestive heart failure) ([place])    a. 03/2013 Echo EF 50-55%;  b. 07/2013 Echo EF 20-25%, diff HK, Gr3 DD;  c. 08/2014 Echo: EF 15%, diff HK, mild AI/MR, mildly dil LA/RV, mod TR, PASP 27mmHg.   DVT (deep venous thrombosis) ([place])    a. 2007 RLE: S/P ankle surgery;  b. 03/2013 LLE DVT & PE-->Xarelto.   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 2007 RLE: S/P ankle surgery;  b. 03/2013 LLE DVT & PE-->Xarelto.   Hypercholesterolemia    Hypercoagulable state ([place])    Hypertension    Musculoskeletal neck pain    Nonischemic cardiomyopathy ([place])    a. 07/2013 Echo: EF 20-25%;  b. 07/2013 Myoview: Large inferior, lateral, apical scar w/ HK, no ischemia, EF 17%;  b. 08/2014 Echo: EF 15%, diff HK.   Pneumonia ~ 04/2014; 09/02/2014   Presence of permanent cardiac pacemaker    a. 03/2013 s/p MDT ADDRL1 Adapta DC PPM, ser # [id number].   Sleep apnea    does not wear mask (09/02/2014)   Third degree heart block ([place])    a. 03/2013 s/p MDT ADDRL1 Adapta DC PPM, ser # [id number] H.   Past Surgical History:  Procedure Laterality Date   CARDIAC CATHETERIZATION N/A 10/17/2014   Procedure: Right/Left Heart Cath and Coronary Angiography;  Surgeon: Larey Dresser, MD;  Location: [facility]  INVASIVE CV LAB;  Service: Cardiovascular;  Laterality: N/A;   ELECTROPHYSIOLOGIC STUDY N/A 09/07/2014   Procedure: A-Flutter;  Surgeon: Evans Lance, MD;  Location: [place] CV LAB;  Service: Cardiovascular;  Laterality: N/A;   EP IMPLANTABLE DEVICE  10/24/2014   BIV   EP IMPLANTABLE DEVICE N/A 10/24/2014   Procedure: BiV ICD Upgrade;  Surgeon: Evans Lance, MD;  Location: [place] CV LAB;  Service: Cardiovascular;  Laterality: N/A;   FOOT FRACTURE SURGERY Right 2007   FRACTURE SURGERY     INGUINAL HERNIA REPAIR Right 1980's   INSERT /  REPLACE / REMOVE PACEMAKER     MDT ADDRL1 pacemaker implanted by Dr Lovena Le for complete heart block   PERMANENT PACEMAKER INSERTION N/A 03/15/2013   Procedure: PERMANENT PACEMAKER INSERTION;  Surgeon: Evans Lance, MD;  Location: [facility] CATH LAB;  Service: Cardiovascular;  Laterality: N/A;    Current Outpatient Medications  Medication Sig Dispense Refill   acetaminophen (TYLENOL) 325 MG tablet Take 1-2 tablets (325-650 mg total) by mouth every 4 (four) hours as needed for mild pain.     apixaban (ELIQUIS) 5 MG TABS tablet TAKE 1 TABLET BY MOUTH TWO TIMES DAILY 60 tablet 5   bisoprolol (ZEBETA) 5 MG tablet TAKE 1 TABLET BY MOUTH DAILY. 30 tablet 11   dapagliflozin propanediol (FARXIGA) 10 MG TABS tablet TAKE 1 TABLET BY MOUTH ONCE A DAY BEFORE BREAKFAST 30 tablet 11   digoxin (LANOXIN) 0.125 MG tablet TAKE 1 TABLET BY MOUTH DAILY. 30 tablet 11   furosemide (LASIX) 20 MG tablet TAKE 1 TABLET (20 MG TOTAL) 2 (TWO) TIMES DAILY BY MOUTH. 180 tablet 3   hydrALAZINE (APRESOLINE) 25 MG tablet TAKE 1/2 TABLET BY MOUTH THREE TIMES DAILY 135 tablet 3   isosorbide mononitrate (IMDUR) 30 MG 24 hr tablet Take 1 tablet (30 mg total) by mouth daily. 30 tablet 3   meloxicam (MOBIC) 15 MG tablet TAKE 1 TABLET BY MOUTH ONCE DAILY. 14 tablet 0   sacubitril-valsartan (ENTRESTO) 24-26 MG TAKE 1 TABLET BY MOUTH 2 TIMES DAILY. 60 tablet 3   simvastatin (ZOCOR) 40 MG tablet TAKE 1 TABLET BY MOUTH EVERY DAY 90 tablet 3   spironolactone (ALDACTONE) 25 MG tablet TAKE 1 TABLET BY MOUTH ONCE A DAY 90 tablet 1   No current facility-administered medications for this encounter.    No Known Allergies  Social History   Socioeconomic History   Marital status: Married    Spouse name: Not on file   Number of children: Not on file   Years of education: Not on file   Highest education level: Not on file  Occupational History   Not on file  Tobacco Use   Smoking status: Former    Packs/day: 1.50    Years: 10.00    Pack  years: 15.00    Types: Cigarettes   Smokeless tobacco: Never   Tobacco comments:    09/02/2014  "quit smoking 20-30 yr ago"  Vaping Use   Vaping Use: Never used  Substance and Sexual Activity   Alcohol use: No   Drug use: No   Sexual activity: Yes  Other Topics Concern   Not on file  Social History Narrative   Not on file   Social Determinants of Health   Financial Resource Strain: Not on file  Food Insecurity: Not on file  Transportation Needs: Not on file  Physical Activity: Not on file  Stress: Not on file  Social Connections: Not on file  Intimate Partner Violence: Not on file     ROS- All systems are reviewed and negative except as per the HPI above.  Physical Exam: Vitals:   07/24/20 1139  BP: 92/68  Pulse: 65  Weight: 92.2 kg  Height: 6\' 1"  (1.854 m)     GEN- The patient is a well appearing male, alert and oriented x 3 today.   HEENT-head normocephalic, atraumatic, sclera clear, conjunctiva pink, hearing intact, trachea midline. Lungs- Clear to ausculation bilaterally, normal work of breathing Heart- Regular rate and rhythm, no murmurs, rubs or gallops  GI- soft, NT, ND, + BS Extremities- no clubbing, cyanosis, or edema MS- no significant deformity or atrophy Skin- no rash or lesion Psych- euthymic mood, full affect Neuro- strength and sensation are intact   Wt Readings from Last 3 Encounters:  07/24/20 92.2 kg  05/29/20 92.7 kg  05/10/20 93.9 kg    EKG today demonstrates  V pacing with underlying afib Vent. rate 65 BPM PR interval * ms QRS duration 190 ms QT/QTcB 470/488 ms  Echo 02/02/20 demonstrated  1. Left ventricular ejection fraction, by estimation, is <20%. The left  ventricle has severely decreased function. The left ventricle demonstrates  global hypokinesis. The left ventricular internal cavity size was  moderately dilated. There is mild left  ventricular hypertrophy. Left ventricular diastolic parameters are  consistent with  Grade II diastolic dysfunction (pseudonormalization).   2. Right ventricular systolic function is mildly reduced. The right  ventricular size is normal. There is normal pulmonary artery systolic  pressure. The estimated right ventricular systolic pressure is 00.8 mmHg.   3. Left atrial size was moderately dilated.   4. The mitral valve is normal in structure. Trivial mitral valve  regurgitation. No evidence of mitral stenosis.   5. The aortic valve is tricuspid. Aortic valve regurgitation is trivial.  No aortic stenosis is present.   6. Aortic dilatation noted. There is mild dilatation of the aortic root,  measuring 41 mm.   7. The inferior vena cava is dilated in size with >50% respiratory  variability, suggesting right atrial pressure of 8 mmHg.   Epic records are reviewed at length today  CHA2DS2-VASc Score = 2  The patient's score is based upon: CHF History: Yes HTN History: No Diabetes History: No Stroke History: No Vascular Disease History: No Age Score: 1 Gender Score: 0      ASSESSMENT AND PLAN: 1. Persistent Atrial Fibrillation/atrial flutter The patient's CHA2DS2-VASc score is 2, indicating a 2.2% annual risk of stroke.   Patient presents for dofetilide admission. Patient aware of price of dofetilide. Continue Eliquis 5 mg BID, states no missed doses in the last 3 weeks. No recent benadryl use PharmD has screened medications QTc in SR 444 ms when corrected for V pacing Labs today show creatinine at 1.41, K+ 3.9 and mag 2.1, CrCl calculated at 64 mL/min  2. Secondary Hypercoagulable State (ICD10:  D68.69) The patient is at significant risk for stroke/thromboembolism based upon his CHA2DS2-VASc Score of 2.  Continue Apixaban (Eliquis).   3. Chronic systolic CHF No signs or symptoms of fluid overload today. Pending upgrade to BiV pacing with Dr Lovena Le.  4. CHB S/p PPM, followed by Dr Lovena Le and the device clinic.    To be admitted later today once a bed  becomes available.    [facility] [address] [place], [place] [address] [phone] 07/24/2020 11:47 AM

## 2020-07-24 NOTE — H&P (Signed)
Primary Care Physician: Wilber Oliphant, MD Primary Cardiologist: Dr Aundra Dubin Primary Electrophysiologist: Dr Lovena Le Referring Physician: Dr Aundra Dubin     Philip Chavez is a 70 y.o. male with a history of complete heart block/Medtronic PPM, chronic systolic CHF, atrial flutter s/p ablation, atrial fibrillation who presents for follow up in the [facility]. Patient is on Eliquis for a CHADS2VASC score of 2. He has had increasing burden of afib noted on his device interrogation. He is unaware of his arrhythmia. He denies any symptoms of fluid overload, Optivol shows normal fluid levels. Of note, he is pending a BiV upgrade with Dr Lovena Le.   On follow up today, patient presents for dofetilide admission. He remains in rate controlled afib today. He denies any missed doses of anticoagulation in the last 3 weeks.    Today, he denies symptoms of palpitations, chest pain, shortness of breath, orthopnea, PND, lower extremity edema, dizziness, presyncope, syncope, snoring, daytime somnolence, bleeding, or neurologic sequela. The patient is tolerating medications without difficulties and is otherwise without complaint today.     Atrial Fibrillation Risk Factors:   he does not have symptoms or diagnosis of sleep apnea. he does not have a history of rheumatic fever.     he has a BMI of Body mass index is 26.81 kg/m.Marland Kitchen    Filed Weights    07/24/20 1139  Weight: 92.2 kg             Family History  Problem Relation Age of Onset   Diabetes type II Mother     Heart disease Father     Arrhythmia Father     Arrhythmia Sister     Hypertension Neg Hx     Heart attack Neg Hx     Stroke Neg Hx          Atrial Fibrillation Management history:   Previous antiarrhythmic drugs: none Previous cardioversions: none Previous ablations: 2016 flutter CHADS2VASC score: 2 Anticoagulation history: Eliquis         Past Medical History:  Diagnosis Date   AICD (automatic  cardioverter/defibrillator) present 0919/2016    BIV   Atrial flutter ([place])      a. 08/2014 presented w/ aflutter->converted on amio;  b. CHA2DS2VASc = 2 -->Xarelto.   Atrial tachycardia (HCC)     Chronic combined systolic and diastolic CHF (congestive heart failure) ([place])      a. 03/2013 Echo EF 50-55%;  b. 07/2013 Echo EF 20-25%, diff HK, Gr3 DD;  c. 08/2014 Echo: EF 15%, diff HK, mild AI/MR, mildly dil LA/RV, mod TR, PASP 51mmHg.   DVT (deep venous thrombosis) ([place])      a. 2007 RLE: S/P ankle surgery;  b. 03/2013 LLE DVT & PE-->Xarelto.   Embolism, pulmonary with infarction ([place])      a. 2007 RLE: S/P ankle surgery;  b. 03/2013 LLE DVT & PE-->Xarelto.   Hypercholesterolemia     Hypercoagulable state ([place])     Hypertension     Musculoskeletal neck pain     Nonischemic cardiomyopathy ([place])      a. 07/2013 Echo: EF 20-25%;  b. 07/2013 Myoview: Large inferior, lateral, apical scar w/ HK, no ischemia, EF 17%;  b. 08/2014 Echo: EF 15%, diff HK.   Pneumonia ~ 04/2014; 09/02/2014   Presence of permanent cardiac pacemaker      a. 03/2013 s/p MDT ADDRL1 Adapta DC PPM, ser # [id number] H.   Sleep apnea      does  not wear mask (09/02/2014)   Third degree heart block ([place])      a. 03/2013 s/p MDT ADDRL1 Adapta DC PPM, ser # [id number] H.         Past Surgical History:  Procedure Laterality Date   CARDIAC CATHETERIZATION N/A 10/17/2014    Procedure: Right/Left Heart Cath and Coronary Angiography;  Surgeon: Larey Dresser, MD;  Location: [place] CV LAB;  Service: Cardiovascular;  Laterality: N/A;   ELECTROPHYSIOLOGIC STUDY N/A 09/07/2014    Procedure: A-Flutter;  Surgeon: Evans Lance, MD;  Location: [place] CV LAB;  Service: Cardiovascular;  Laterality: N/A;   EP IMPLANTABLE DEVICE   10/24/2014    BIV   EP IMPLANTABLE DEVICE N/A 10/24/2014    Procedure: BiV ICD Upgrade;  Surgeon: Evans Lance, MD;  Location: [place] CV LAB;  Service: Cardiovascular;  Laterality: N/A;   FOOT FRACTURE SURGERY  Right 2007   FRACTURE SURGERY       INGUINAL HERNIA REPAIR Right 1980's   INSERT / REPLACE / REMOVE PACEMAKER        MDT ADDRL1 pacemaker implanted by Dr Lovena Le for complete heart block   PERMANENT PACEMAKER INSERTION N/A 03/15/2013    Procedure: PERMANENT PACEMAKER INSERTION;  Surgeon: Evans Lance, MD;  Location: [facility] CATH LAB;  Service: Cardiovascular;  Laterality: N/A;            Current Outpatient Medications  Medication Sig Dispense Refill   acetaminophen (TYLENOL) 325 MG tablet Take 1-2 tablets (325-650 mg total) by mouth every 4 (four) hours as needed for mild pain.       apixaban (ELIQUIS) 5 MG TABS tablet TAKE 1 TABLET BY MOUTH TWO TIMES DAILY 60 tablet 5   bisoprolol (ZEBETA) 5 MG tablet TAKE 1 TABLET BY MOUTH DAILY. 30 tablet 11   dapagliflozin propanediol (FARXIGA) 10 MG TABS tablet TAKE 1 TABLET BY MOUTH ONCE A DAY BEFORE BREAKFAST 30 tablet 11   digoxin (LANOXIN) 0.125 MG tablet TAKE 1 TABLET BY MOUTH DAILY. 30 tablet 11   furosemide (LASIX) 20 MG tablet TAKE 1 TABLET (20 MG TOTAL) 2 (TWO) TIMES DAILY BY MOUTH. 180 tablet 3   hydrALAZINE (APRESOLINE) 25 MG tablet TAKE 1/2 TABLET BY MOUTH THREE TIMES DAILY 135 tablet 3   isosorbide mononitrate (IMDUR) 30 MG 24 hr tablet Take 1 tablet (30 mg total) by mouth daily. 30 tablet 3   meloxicam (MOBIC) 15 MG tablet TAKE 1 TABLET BY MOUTH ONCE DAILY. 14 tablet 0   sacubitril-valsartan (ENTRESTO) 24-26 MG TAKE 1 TABLET BY MOUTH 2 TIMES DAILY. 60 tablet 3   simvastatin (ZOCOR) 40 MG tablet TAKE 1 TABLET BY MOUTH EVERY DAY 90 tablet 3   spironolactone (ALDACTONE) 25 MG tablet TAKE 1 TABLET BY MOUTH ONCE A DAY 90 tablet 1    No current facility-administered medications for this encounter.      No Known Allergies   Social History         Socioeconomic History   Marital status: Married      Spouse name: Not on file   Number of children: Not on file   Years of education: Not on file   Highest education level: Not on file   Occupational History   Not on file  Tobacco Use   Smoking status: Former      Packs/day: 1.50      Years: 10.00      Pack years: 15.00      Types: Cigarettes   Smokeless  tobacco: Never   Tobacco comments:      09/02/2014  "quit smoking 20-30 yr ago"  Vaping Use   Vaping Use: Never used  Substance and Sexual Activity   Alcohol use: No   Drug use: No   Sexual activity: Yes  Other Topics Concern   Not on file  Social History Narrative   Not on file    Social Determinants of Health    Financial Resource Strain: Not on file  Food Insecurity: Not on file  Transportation Needs: Not on file  Physical Activity: Not on file  Stress: Not on file  Social Connections: Not on file  Intimate Partner Violence: Not on file        ROS- All systems are reviewed and negative except as per the HPI above.   Physical Exam:    Vitals:    07/24/20 1139  BP: 92/68  Pulse: 65  Weight: 92.2 kg  Height: 6\' 1"  (1.854 m)        GEN- The patient is a well appearing male, alert and oriented x 3 today.   HEENT-head normocephalic, atraumatic, sclera clear, conjunctiva pink, hearing intact, trachea midline. Lungs- Clear to ausculation bilaterally, normal work of breathing Heart- Regular rate and rhythm, no murmurs, rubs or gallops GI- soft, NT, ND, + BS Extremities- no clubbing, cyanosis, or edema MS- no significant deformity or atrophy Skin- no rash or lesion Psych- euthymic mood, full affect Neuro- strength and sensation are intact        Wt Readings from Last 3 Encounters:  07/24/20 92.2 kg  05/29/20 92.7 kg  05/10/20 93.9 kg      EKG today demonstrates  V pacing with underlying afib Vent. rate 65 BPM PR interval * ms QRS duration 190 ms QT/QTcB 470/488 ms   Echo 02/02/20 demonstrated 1. Left ventricular ejection fraction, by estimation, is <20%. The left  ventricle has severely decreased function. The left ventricle demonstrates  global hypokinesis. The left ventricular  internal cavity size was  moderately dilated. There is mild left  ventricular hypertrophy. Left ventricular diastolic parameters are  consistent with Grade II diastolic dysfunction (pseudonormalization).   2. Right ventricular systolic function is mildly reduced. The right  ventricular size is normal. There is normal pulmonary artery systolic  pressure. The estimated right ventricular systolic pressure is 26.8 mmHg.   3. Left atrial size was moderately dilated.   4. The mitral valve is normal in structure. Trivial mitral valve  regurgitation. No evidence of mitral stenosis.   5. The aortic valve is tricuspid. Aortic valve regurgitation is trivial.  No aortic stenosis is present.   6. Aortic dilatation noted. There is mild dilatation of the aortic root,  measuring 41 mm.   7. The inferior vena cava is dilated in size with >50% respiratory  variability, suggesting right atrial pressure of 8 mmHg.   Epic records are reviewed at length today   CHA2DS2-VASc Score = 2  The patient's score is based upon: CHF History: Yes HTN History: No Diabetes History: No Stroke History: No Vascular Disease History: No Age Score: 1 Gender Score: 0        ASSESSMENT AND PLAN: 1. Persistent Atrial Fibrillation/atrial flutter The patient's CHA2DS2-VASc score is 2, indicating a 2.2% annual risk of stroke.   Patient presents for dofetilide admission. Patient aware of price of dofetilide. Continue Eliquis 5 mg BID, states no missed doses in the last 3 weeks. No recent benadryl use PharmD has screened medications QTc in SR  444 ms when corrected for V pacing Labs today show creatinine at 1.41, K+ 3.9 and mag 2.1, CrCl calculated at 64 mL/min   2. Secondary Hypercoagulable State (ICD10:  D68.69) The patient is at significant risk for stroke/thromboembolism based upon his CHA2DS2-VASc Score of 2.  Continue Apixaban (Eliquis).   3. Chronic systolic CHF No signs or symptoms of fluid overload  today. Pending upgrade to BiV pacing with Dr Lovena Le.   4. CHB S/p PPM, followed by Dr Lovena Le and the device clinic.     To be admitted later today once a bed becomes available.      [facility] [address] [place], [place] [address] [phone] 07/24/2020 11:47 AM  EP Attending  Patient seen and examined. Agree with above. The patient presents today for initiation of dofetilide. He is a long term patient of mine, with persistent atrial fib. His QT is a little long but he has an ICD in place. He has had atrial fib for almost 2 years. He does not have palpitations but his atrial fib is thought ot have made his CHF worse. I have reviewed that he will require 3.5 days of inpatient observation and we will plan DCCV after 2 days if he has not reverted back to NSR.   Carleene Overlie Aleni Andrus,MD

## 2020-07-24 NOTE — Progress Notes (Signed)
Pharmacy Review for Dofetilide (Tikosyn) Initiation  Admit Complaint: 70 y.o. male admitted 07/24/2020 with atrial fibrillation to be initiated on dofetilide.   Assessment:  Patient Exclusion Criteria: If any screening criteria checked as "Yes", then  patient  should NOT receive dofetilide until criteria item is corrected. If "Yes" please indicate correction plan.  YES  NO Patient  Exclusion Criteria Correction Plan  [x]  []  Baseline QTc interval is greater than or equal to 440 msec. IF above YES box checked dofetilide contraindicated unless patient has ICD; then may proceed if QTc 500-550 msec or with known ventricular conduction abnormalities may proceed with QTc 550-600 msec. QTc =  444 with V pacing   []  [x]  Magnesium level is less than 1.8 mEq/l : Last magnesium:  Lab Results  Component Value Date   MG 2.1 07/24/2020         [x]  []  Potassium level is less than 4 mEq/l : Last potassium:  Lab Results  Component Value Date   K 3.9 07/24/2020     Will replete 40 mEq before dofetilide dose     []  [x]  Patient is known or suspected to have a digoxin level greater than 2 ng/ml: Lab Results  Component Value Date   DIGOXIN 0.4 (L) 05/24/2020      []  [x]  Creatinine clearance less than 20 ml/min (calculated using Cockcroft-Gault, actual body weight and serum creatinine): Estimated Creatinine Clearance: 55.9 mL/min (A) (by C-G formula based on SCr of 1.41 mg/dL (H)).    []  [x]  Patient has received drugs known to prolong the QT intervals within the last 48 hours (phenothiazines, tricyclics or tetracyclic antidepressants, erythromycin, H-1 antihistamines, cisapride, fluoroquinolones, azithromycin). Drugs not listed above may have an, as yet, undetected potential to prolong the QT interval, updated information on QT prolonging agents is available at this website:QT prolonging agents   []  [x]  Patient received a dose of hydrochlorothiazide (Oretic) alone or in any combination including  triamterene (Dyazide, Maxzide) in the last 48 hours.   []  []  Patient received a medication known to increase dofetilide plasma concentrations prior to initial dofetilide dose:  Trimethoprim (Primsol, Proloprim) in the last 36 hours Verapamil (Calan, Verelan) in the last 36 hours or a sustained release dose in the last 72 hours Megestrol (Megace) in the last 5 days  Cimetidine (Tagamet) in the last 6 hours Ketoconazole (Nizoral) in the last 24 hours Itraconazole (Sporanox) in the last 48 hours  Prochlorperazine (Compazine) in the last 36 hours    []  [x]  Patient is known to have a history of torsades de pointes; congenital or acquired long QT syndromes.   []  [x]  Patient has received a Class 1 antiarrhythmic with less than 2 half-lives since last dose. (Disopyramide, Quinidine, Procainamide, Lidocaine, Mexiletine, Flecainide, Propafenone)   []  [x]  Patient has received amiodarone therapy in the past 3 months or amiodarone level is greater than 0.3 ng/ml.    Patient has been appropriately anticoagulated with apixaban.  Ordering provider was confirmed at [email or web address] if they are not listed on the [place] Prescribers list.  Goal of Therapy: Follow renal function, electrolytes, potential drug interactions, and dose adjustment. Provide education and 1 week supply at discharge.  Plan:  []   Physician selected initial dose within range recommended for patients level of renal function - will monitor for response.  [x]   Physician selected initial dose outside of range recommended for patients level of renal function - discussed with MD, agreed with dose adjustment to 250 mcg   [employer] One  Calculated CrCl  Dose q12h  []  > 60 ml/min 500 mcg  [x]  40-60 ml/min 250 mcg  []  20-40 ml/min 125 mcg   2. Follow up QTc after the first 5 doses, renal function, electrolytes (K & Mg) daily x 3 days, dose adjustment, success of initiation and facilitate 1 week discharge supply as clinically  indicated.  3. Initiate Tikosyn education video (Call [phone] and ask for video # [phone]).  4. Place Enrollment Form on the chart for discharge supply of dofetilide.  Benetta Spar, PharmD, BCPS, BCCP Clinical Pharmacist  Please check AMION for all [place] phone numbers After 10:00 PM, call [place] [phone]

## 2020-07-25 ENCOUNTER — Other Ambulatory Visit (HOSPITAL_COMMUNITY): Payer: Self-pay

## 2020-07-25 ENCOUNTER — Encounter (HOSPITAL_COMMUNITY): Payer: Self-pay | Admitting: Internal Medicine

## 2020-07-25 LAB — BASIC METABOLIC PANEL
Anion gap: 7 (ref 5–15)
BUN: 16 mg/dL (ref 8–23)
CO2: 23 mmol/L (ref 22–32)
Calcium: 8.8 mg/dL — ABNORMAL LOW (ref 8.9–10.3)
Chloride: 106 mmol/L (ref 98–111)
Creatinine, Ser: 1.28 mg/dL — ABNORMAL HIGH (ref 0.61–1.24)
GFR, Estimated: >60 mL/min (ref >60)
Glucose, Bld: 127 mg/dL — ABNORMAL HIGH (ref 70–99)
Potassium: 3.7 mmol/L (ref 3.5–5.1)
Sodium: 136 mmol/L (ref 135–145)

## 2020-07-25 LAB — MAGNESIUM: Magnesium: 2.1 mg/dL (ref 1.7–2.4)

## 2020-07-25 MED ORDER — DOFETILIDE 500 MCG PO CAPS
500.0000 ug | ORAL_CAPSULE | Freq: Two times a day (BID) | ORAL | Status: DC
  Administered 2020-07-25 – 2020-07-27 (×5): 500 ug via ORAL
  Filled 2020-07-25 (×5): qty 1

## 2020-07-25 MED ORDER — POTASSIUM CHLORIDE CRYS ER 20 MEQ PO TBCR
40.0000 meq | EXTENDED_RELEASE_TABLET | ORAL | Status: AC
  Administered 2020-07-25 (×2): 40 meq via ORAL
  Filled 2020-07-25 (×2): qty 2

## 2020-07-25 NOTE — TOC Benefit Eligibility Note (Signed)
Patient Teacher, English as a foreign language completed.    The patient is currently admitted and upon discharge will be taking Dofetilde (Tikosyn) 500 mcg..  The current 30 day co-pay is, $10.00.   The patient is insured through [place] [place] Patient Advocate Specialist [employer] Direct Number: [phone]  Fax: [phone]

## 2020-07-25 NOTE — Progress Notes (Signed)
Post dose ekg is reviewed  Manually measure looks 560, QRS 186, corrects to <563ms (towards 5451) Discussed with Dr. Lovena Le, continue 547mcg tonight  Dillon Bjork, PA-C

## 2020-07-25 NOTE — Progress Notes (Addendum)
Progress Note  Patient Name: Philip Chavez Date of Encounter: 07/25/2020  [facility] Cardiologist: Dr. Aundra Dubin  Subjective   Feels OK, no CP, SOB  Inpatient Medications    Scheduled Meds:  apixaban  5 mg Oral BID   bisoprolol  5 mg Oral Daily   [START ON 07/26/2020] dapagliflozin propanediol  10 mg Oral QAC breakfast   digoxin  0.125 mg Oral Daily   dofetilide  500 mcg Oral BID   furosemide  20 mg Oral BID   hydrALAZINE  12.5 mg Oral TID   isosorbide mononitrate  30 mg Oral Daily   potassium chloride  40 mEq Oral Q4H   sacubitril-valsartan  1 tablet Oral BID   simvastatin  40 mg Oral q1800   sodium chloride flush  3 mL Intravenous Q12H   spironolactone  25 mg Oral Daily   Continuous Infusions:  sodium chloride     PRN Meds: sodium chloride, acetaminophen, sodium chloride flush   Vital Signs    Vitals:   07/24/20 1624 07/24/20 1936 07/25/20 0500  BP: (!) 88/54 104/62 108/66  Pulse: (!) 51 (!) 52 (!) 50  Resp: 14    Temp: 98.1 F (36.7 C) 99.7 F (37.6 C) (!) 97.5 F (36.4 C)  TempSrc: Oral Oral Oral  SpO2: 96% 98% 100%  Weight: 91.6 kg    Height: 6\' 1"  (1.854 m)      Intake/Output Summary (Last 24 hours) at 07/25/2020 8144 Last data filed at 07/24/2020 1700 Gross per 24 hour  Intake 360 ml  Output --  Net 360 ml   Last 3 Weights 07/24/2020 07/24/2020 05/29/2020  Weight (lbs) 201 lb 14.4 oz 203 lb 3.2 oz 204 lb 6.4 oz  Weight (kg) 91.581 kg 92.171 kg 92.715 kg      Telemetry    AFib, V paced - Personally Reviewed  ECG    V paced, measured QT 529ms, QRS 252ms, >> QTc 422ms - Personally Reviewed  Physical Exam   GEN: No acute distress.   Neck: No JVD Cardiac: RRR (paced), no murmurs, rubs, or gallops.  Respiratory: CTA b/l. GI: Soft, nontender, non-distended  MS: No edema; No deformity. Neuro:  Nonfocal  Psych: Normal affect   Labs    High Sensitivity Troponin:  No results for input(s): TROPONINIHS in the last 720 hours.     Chemistry Recent Labs  Lab 07/24/20 1145 07/25/20 0206  NA 138 136  K 3.9 3.7  CL 104 106  CO2 26 23  GLUCOSE 97 127*  BUN 16 16  CREATININE 1.41* 1.28*  CALCIUM 9.2 8.8*  GFRNONAA 54* >60  ANIONGAP 8 7     HematologyNo results for input(s): WBC, RBC, HGB, HCT, MCV, MCH, MCHC, RDW, PLT in the last 168 hours.  BNPNo results for input(s): BNP, PROBNP in the last 168 hours.   DDimer No results for input(s): DDIMER in the last 168 hours.   Radiology    No results found.  Cardiac Studies   02/02/20 demonstrated 1. Left ventricular ejection fraction, by estimation, is <20%. The left  ventricle has severely decreased function. The left ventricle demonstrates  global hypokinesis. The left ventricular internal cavity size was  moderately dilated. There is mild left  ventricular hypertrophy. Left ventricular diastolic parameters are  consistent with Grade II diastolic dysfunction (pseudonormalization).   2. Right ventricular systolic function is mildly reduced. The right  ventricular size is normal. There is normal pulmonary artery systolic  pressure. The estimated right ventricular systolic  pressure is 20.2 mmHg.   3. Left atrial size was moderately dilated.   4. The mitral valve is normal in structure. Trivial mitral valve  regurgitation. No evidence of mitral stenosis.   5. The aortic valve is tricuspid. Aortic valve regurgitation is trivial.  No aortic stenosis is present.   6. Aortic dilatation noted. There is mild dilatation of the aortic root,  measuring 41 mm.   7. The inferior vena cava is dilated in size with >50% respiratory  variability, suggesting right atrial pressure of 8 mmHg.  Patient Profile     70 y.o. 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 Lovena Le and pharmacy K+ 3.7 replacement ordered Mag2.1 Creat 1.28 (71, his baseline Creat looks about 1.3 = 69) Paced QT, QTc is acceptable, stable, reviewed with Dr. Lovena Le  Plan DCCV tomorrow if not in SR  2. Chronic CHF (systolic 3. NICM Not volume OL currently Cont home meds  For questions or updates, please contact [place] Please consult [email or web address] for contact info under   Signed, [place], PA-C  07/25/2020, 8:32 AM    EP Attending  Patient seen and examined. Agree with above. The patient has been started on dofetilide but dose borderline with renal function. His kidney function a little better today and with his ICD, he will be dosed on the 500 mcg bid. We will follow the QT which is longish but QRS is 200. DCCV tomorrow.  Carleene Overlie Renee Beale,MD

## 2020-07-25 NOTE — TOC Benefit Eligibility Note (Signed)
Transition of Care [facility]) Benefit Eligibility Note    Patient Details  Name: Demitrious Mccannon MRN: [id number] Date of Birth: 14-Jul-1950   Medication/Dose: Phyllis Ginger   500 MCG BID  Covered?: Yes  Tier: 3 Drug  Prescription Coverage Preferred Pharmacy: [facility]    [facility]  Spoke with Person/Company/Phone Number:: DONTREALE   @  [facility] CHOICE #  [phone]  Co-Pay: $ 64.00  Prior Approval: Yes (# [id number])  Deductible: Met (OUT-OF-POCKET : UNMET)  Additional Notes: DOFETILIDE  500 MCG BID  COVER- YES    CO-PAY- $ 10.00  TIER- 1 DRUG     P/A-NO    Memory Argue Phone Number: 07/25/2020, 2:44 PM

## 2020-07-25 NOTE — Progress Notes (Addendum)
Pharmacy: Dofetilide (Tikosyn) - Follow Up Assessment and Electrolyte Replacement  Pharmacy consulted to assist in monitoring and replacing electrolytes in this 70 y.o. male admitted on 07/24/2020 undergoing dofetilide initiation. First dofetilide dose: 07/24/20.   Labs:    Component Value Date/Time   K 3.7 07/25/2020 0206   MG 2.1 07/25/2020 0206    Scr down to 1.28. Calculated crcl>60. Discussed with EP will increase tikosyn to 500 mcg bid. Qtc stable, ICD present.   Plan: Potassium: K 3.5-3.7:  Give KCl 40 mEq po q4 x 2.   Magnesium: Mg > 2: No additional supplementation needed   Thank you for allowing pharmacy to participate in this patient's care   Erin Hearing PharmD., BCPS Clinical Pharmacist 07/25/2020 8:09 AM

## 2020-07-26 ENCOUNTER — Inpatient Hospital Stay (HOSPITAL_COMMUNITY): Payer: Medicare PPO | Admitting: Anesthesiology

## 2020-07-26 ENCOUNTER — Encounter: Payer: Medicare PPO | Admitting: Internal Medicine

## 2020-07-26 ENCOUNTER — Telehealth: Payer: Self-pay | Admitting: Internal Medicine

## 2020-07-26 ENCOUNTER — Encounter (HOSPITAL_COMMUNITY): Payer: Self-pay | Admitting: Internal Medicine

## 2020-07-26 ENCOUNTER — Encounter (HOSPITAL_COMMUNITY)
Admission: AD | Disposition: A | Discharge status: Home / Self Care | Payer: Self-pay | Source: Ambulatory Visit | Attending: Internal Medicine

## 2020-07-26 HISTORY — PX: CARDIOVERSION: SHX1299

## 2020-07-26 LAB — BASIC METABOLIC PANEL
Anion gap: 10 (ref 5–15)
BUN: 12 mg/dL (ref 8–23)
CO2: 24 mmol/L (ref 22–32)
Calcium: 9.2 mg/dL (ref 8.9–10.3)
Chloride: 100 mmol/L (ref 98–111)
Creatinine, Ser: 1.19 mg/dL (ref 0.61–1.24)
GFR, Estimated: >60 mL/min (ref >60)
Glucose, Bld: 101 mg/dL — ABNORMAL HIGH (ref 70–99)
Potassium: 4.1 mmol/L (ref 3.5–5.1)
Sodium: 134 mmol/L — ABNORMAL LOW (ref 135–145)

## 2020-07-26 LAB — MAGNESIUM: Magnesium: 2 mg/dL (ref 1.7–2.4)

## 2020-07-26 SURGERY — CARDIOVERSION
Anesthesia: General

## 2020-07-26 MED ORDER — MAGNESIUM SULFATE 2 GM/50ML IV SOLN
2.0000 g | Freq: Once | INTRAVENOUS | Status: AC
  Administered 2020-07-26: 2 g via INTRAVENOUS
  Filled 2020-07-26: qty 50

## 2020-07-26 MED ORDER — PROPOFOL 10 MG/ML IV BOLUS
INTRAVENOUS | Status: DC | PRN
  Administered 2020-07-26: 60 mg via INTRAVENOUS

## 2020-07-26 MED ORDER — SODIUM CHLORIDE 0.9 % IV SOLN: INTRAVENOUS | Status: DC | PRN

## 2020-07-26 MED ORDER — LIDOCAINE 2% (20 MG/ML) 5 ML SYRINGE
INTRAMUSCULAR | Status: DC | PRN
  Administered 2020-07-26: 80 mg via INTRAVENOUS

## 2020-07-26 NOTE — Progress Notes (Addendum)
Progress Note  Patient Name: Philip Chavez Date of Encounter: 07/26/2020  [facility] Cardiologist: Dr. Aundra Dubin  Subjective   Feels OK, no CP, SOB  Inpatient Medications    Scheduled Meds:  apixaban  5 mg Oral BID   bisoprolol  5 mg Oral Daily   dapagliflozin propanediol  10 mg Oral QAC breakfast   digoxin  0.125 mg Oral Daily   dofetilide  500 mcg Oral BID   furosemide  20 mg Oral BID   hydrALAZINE  12.5 mg Oral TID   isosorbide mononitrate  30 mg Oral Daily   sacubitril-valsartan  1 tablet Oral BID   simvastatin  40 mg Oral q1800   sodium chloride flush  3 mL Intravenous Q12H   spironolactone  25 mg Oral Daily   Continuous Infusions:  sodium chloride     PRN Meds: sodium chloride, acetaminophen, sodium chloride flush   Vital Signs    Vitals:   07/25/20 1115 07/25/20 1805 07/25/20 2100 07/26/20 0559  BP: (!) 96/56 (!) 105/51 114/73 106/64  Pulse: (!) 50   (!) 51  Resp: 18  16 15   Temp: 97.9 F (36.6 C)  97.8 F (36.6 C) 98 F (36.7 C)  TempSrc: Oral  Oral Oral  SpO2: 99%   98%  Weight:      Height:        Intake/Output Summary (Last 24 hours) at 07/26/2020 0749 Last data filed at 07/25/2020 0900 Gross per 24 hour  Intake 360 ml  Output --  Net 360 ml   Last 3 Weights 07/24/2020 07/24/2020 05/29/2020  Weight (lbs) 201 lb 14.4 oz 203 lb 3.2 oz 204 lb 6.4 oz  Weight (kg) 91.581 kg 92.171 kg 92.715 kg      Telemetry    AFib, V paced - Personally Reviewed  ECG    V paced,QT 548, QRS 186, QTc remains acceptable - Personally Reviewed  Physical Exam   unchanged GEN: No acute distress.   Neck: No JVD Cardiac: RRR (paced), no murmurs, rubs, or gallops.  Respiratory: CTA b/l. GI: Soft, nontender, non-distended  MS: No edema; No deformity. Neuro:  Nonfocal  Psych: Normal affect   Labs    High Sensitivity Troponin:  No results for input(s): TROPONINIHS in the last 720 hours.    Chemistry Recent Labs  Lab 07/24/20 1145 07/25/20 0206  07/26/20 0306  NA 138 136 134*  K 3.9 3.7 4.1  CL 104 106 100  CO2 26 23 24   GLUCOSE 97 127* 101*  BUN 16 16 12   CREATININE 1.41* 1.28* 1.19  CALCIUM 9.2 8.8* 9.2  GFRNONAA 54* >60 >60  ANIONGAP 8 7 10      HematologyNo results for input(s): WBC, RBC, HGB, HCT, MCV, MCH, MCHC, RDW, PLT in the last 168 hours.  BNPNo results for input(s): BNP, PROBNP in the last 168 hours.   DDimer No results for input(s): DDIMER in the last 168 hours.   Radiology    No results found.  Cardiac Studies   02/02/20 demonstrated 1. Left ventricular ejection fraction, by estimation, is <20%. The left  ventricle has severely decreased function. The left ventricle demonstrates  global hypokinesis. The left ventricular internal cavity size was  moderately dilated. There is mild left  ventricular hypertrophy. Left ventricular diastolic parameters are  consistent with Grade II diastolic dysfunction (pseudonormalization).   2. Right ventricular systolic function is mildly reduced. The right  ventricular size is normal. There is normal pulmonary artery systolic  pressure. The  estimated right ventricular systolic pressure is 29.5 mmHg.   3. Left atrial size was moderately dilated.   4. The mitral valve is normal in structure. Trivial mitral valve  regurgitation. No evidence of mitral stenosis.   5. The aortic valve is tricuspid. Aortic valve regurgitation is trivial.  No aortic stenosis is present.   6. Aortic dilatation noted. There is mild dilatation of the aortic root,  measuring 41 mm.   7. The inferior vena cava is dilated in size with >50% respiratory  variability, suggesting right atrial pressure of 8 mmHg.  Patient Profile     70 y.o. 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 Chronic CHF (systolic 3. NICM Not volume OL currently Cont home meds  For questions or updates, please contact [place] Please consult [email or web address] for contact info under   Signed, [place], PA-C  07/26/2020, 7:49 AM    EP Attending  Patient seen and examined. Agree with above. His QT is ok and he remains in atrial fib. He will undergo DCCV today. Home tomorrow if no other issues. He is anxious to get home as his wife is blind.  Carleene Overlie Zyaira Vejar,MD

## 2020-07-26 NOTE — Anesthesia Postprocedure Evaluation (Signed)
Anesthesia Post Note  Patient: Philip Chavez  Procedure(s) Performed: CARDIOVERSION     Patient location during evaluation: PACU Anesthesia Type: General Level of consciousness: awake and alert Pain management: pain level controlled Vital Signs Assessment: post-procedure vital signs reviewed and stable Respiratory status: spontaneous breathing, nonlabored ventilation and respiratory function stable Cardiovascular status: blood pressure returned to baseline and stable Postop Assessment: no apparent nausea or vomiting Anesthetic complications: no   No notable events documented.  Last Vitals:  Vitals:   07/26/20 1030 07/26/20 1114  BP: 97/68 99/67  Pulse: 63   Resp: (!) 23   Temp:    SpO2: 99%     Last Pain:  Vitals:   07/26/20 1030  TempSrc:   PainSc: 0-No pain                 Merlinda Frederick

## 2020-07-26 NOTE — Progress Notes (Signed)
Post dose/DCCV EKG reviewed QTc remains acceptable Continue 522mcg dose tonight  Tommye Standard, PA-C

## 2020-07-26 NOTE — Interval H&P Note (Signed)
History and Physical Interval Note:  07/26/2020 9:44 AM  Philip Chavez  has presented today for surgery, with the diagnosis of afib.  The various methods of treatment have been discussed with the patient and family. After consideration of risks, benefits and other options for treatment, the patient has consented to  Procedure(s): CARDIOVERSION (N/A) as a surgical intervention.  The patient's history has been reviewed, patient examined, no change in status, stable for surgery.  I have reviewed the patient's chart and labs.  Questions were answered to the patient's satisfaction.     [name]

## 2020-07-26 NOTE — Telephone Encounter (Signed)
Pt calling to advise that paperwork is coming to office to be filled out by Dr. Lovena Le.  This nurse was previously aware.  Will monitor for paperwork.

## 2020-07-26 NOTE — Transfer of Care (Signed)
Immediate Anesthesia Transfer of Care Note  Patient: Philip Chavez  Procedure(s) Performed: CARDIOVERSION  Patient Location: Endoscopy Unit  Anesthesia Type:General  Level of Consciousness: drowsy and patient cooperative  Airway & Oxygen Therapy: Patient Spontanous Breathing and Patient connected to nasal cannula oxygen  Post-op Assessment: Report given to RN, Post -op Vital signs reviewed and stable and Patient moving all extremities  Post vital signs: Reviewed and stable  Last Vitals:  Vitals Value Taken Time  BP    Temp    Pulse    Resp    SpO2      Last Pain:  Vitals:   07/26/20 0930  TempSrc: Axillary  PainSc: 0-No pain      Patients Stated Pain Goal: 0 (01/74/94 4967)  Complications: No notable events documented.

## 2020-07-26 NOTE — Discharge Summary (Addendum)
ELECTROPHYSIOLOGY PROCEDURE DISCHARGE SUMMARY    Patient ID: Philip Chavez,  MRN: [id number], DOB/AGE: 04-16-1950 70 y.o.  Admit date: 07/24/2020 Discharge date: 07/27/20  Primary Care Physician: Philip Oliphant, MD  Primary Cardiologist: Dr. Aundra Chavez Electrophysiologist: Dr. Lovena Chavez  Primary Discharge Diagnosis:  1.  persistent atrial fibrillation status post Tikosyn loading this admission      CHA2DS2Vasc is 2, on Eliquis  Secondary Discharge Diagnosis:  NICM Chronic CHF (systolic) Currently compensated   No Known Allergies   Procedures This Admission:  1.  Tikosyn loading 2.  Direct current cardioversion on 07/26/20 by Dr Philip Chavez which successfully restored SR.  There were no early apparent complications.   Brief HPI: Philip Chavez is a 70 y.o. male with a past medical history as noted above.  They were referred to EP in the outpatient setting for treatment options of atrial fibrillation.  Risks, benefits, and alternatives to Tikosyn were reviewed with the patient who wished to proceed.    Hospital Course:  The patient was admitted and Tikosyn was initiated.  Renal function and electrolytes were followed during the hospitalization.  The patient's QTc remained stable.  On 07/26/20 the patient underwent direct current cardioversion which restored sinus rhythm.  He was monitored until discharge on telemetry which demonstrated post DCCV SR/V paced, rare PVCs, couple.  On the day of discharge, He feels well, was examined by Dr Philip Chavez who considered the patient stable for discharge to home.  Follow-up has been arranged with the AFib clinic in 1 week and with Dr Philip Chavez in 4 weeks.   Tikosyn teaching was completed electrolyte replacement for home, he will need potassium, appreciate pharmacy thoughts, agree with entresto and aldactone hesitant to give 40mg  daily.  Out patient his K+ has been about 3.8-4.1, he arrived 3.9, NOTE that he is ACTUALLY only taking the lasix once daily at  home, and BID here, given this I will send with 51meq daily Pharmacy team has confirmed drug availability at preferred pharmacy  Physical Exam: Vitals:   07/26/20 1030 07/26/20 1114 07/26/20 2004 07/27/20 0632  BP: 97/68 99/67 106/72 112/67  Pulse: 63   71  Resp: (!) 23 18  18   Temp:   97.9 F (36.6 C) 98.2 F (36.8 C)  TempSrc:   Oral Oral  SpO2: 99%     Weight:      Height:         GEN- The patient is well appearing, alert and oriented x 3 today.   HEENT: normocephalic, atraumatic; sclera clear, conjunctiva pink; hearing intact; oropharynx clear; neck supple, no JVP Lymph- no cervical lymphadenopathy Lungs- CTA b/l, normal work of breathing.  No wheezes, rales, rhonchi Heart- RRR, no murmurs, rubs or gallops, PMI not laterally displaced GI- soft, non-tender, non-distended Extremities- no clubbing, cyanosis, or edema MS- no significant deformity or atrophy Skin- warm and dry, no rash or lesion Psych- euthymic mood, full affect Neuro- strength and sensation are intact   Labs:   Lab Results  Component Value Date   WBC 5.2 02/02/2020   HGB 15.2 02/02/2020   HCT 46.3 02/02/2020   MCV 95.5 02/02/2020   PLT 164 02/02/2020    Recent Labs  Lab 07/27/20 0226  NA 135  K 3.7  CL 104  CO2 25  BUN 16  CREATININE 1.32*  CALCIUM 9.1  GLUCOSE 120*     Discharge Medications:  Allergies as of 07/27/2020   No Known Allergies      Medication List  TAKE these medications    acetaminophen 325 MG tablet Commonly known as: TYLENOL Take 1-2 tablets (325-650 mg total) by mouth every 4 (four) hours as needed for mild pain.   bisoprolol 5 MG tablet Commonly known as: ZEBETA TAKE 1 TABLET BY MOUTH DAILY. What changed: how much to take   CertaVite/Antioxidants Tabs Take 1 tablet by mouth daily with breakfast.   digoxin 0.125 MG tablet Commonly known as: LANOXIN TAKE 1 TABLET BY MOUTH DAILY. What changed:  how much to take when to take this   dofetilide 500  MCG capsule Commonly known as: TIKOSYN Take 1 capsule (500 mcg total) by mouth 2 (two) times daily.   Eliquis 5 MG Tabs tablet Generic drug: apixaban TAKE 1 TABLET BY MOUTH TWO TIMES DAILY What changed: how much to take   Entresto 24-26 MG Generic drug: sacubitril-valsartan TAKE 1 TABLET BY MOUTH 2 TIMES DAILY.   Farxiga 10 MG Tabs tablet Generic drug: dapagliflozin propanediol TAKE 1 TABLET BY MOUTH ONCE A DAY BEFORE BREAKFAST What changed:  how much to take how to take this when to take this   furosemide 20 MG tablet Commonly known as: LASIX TAKE 1 TABLET (20 MG TOTAL) 2 (TWO) TIMES DAILY BY MOUTH. What changed: See the new instructions. Notes to patient: Continue to take daily as you had been previously instructed to do so   hydrALAZINE 25 MG tablet Commonly known as: APRESOLINE TAKE 1/2 TABLET BY MOUTH THREE TIMES DAILY What changed:  how much to take when to take this additional instructions   isosorbide mononitrate 30 MG 24 hr tablet Commonly known as: IMDUR Take 1 tablet (30 mg total) by mouth daily.   oxymetazoline 0.05 % nasal spray Commonly known as: AFRIN Place 1 spray into both nostrils 2 (two) times daily as needed for congestion.   potassium chloride 10 MEQ tablet Commonly known as: KLOR-CON Take 1 tablet (10 mEq total) by mouth daily.   simvastatin 40 MG tablet Commonly known as: ZOCOR TAKE 1 TABLET BY MOUTH EVERY DAY What changed: when to take this   spironolactone 25 MG tablet Commonly known as: ALDACTONE TAKE 1 TABLET BY MOUTH ONCE A DAY What changed:  how much to take when to take this        Disposition:  Discharge Instructions     Diet - low sodium heart healthy   Complete by: As directed    Increase activity slowly   Complete by: As directed        Follow-up Information     [facility] Follow up.   Specialty: Cardiology Why: 08/03/20 @ 9:30AM with Philip Brawn, PA-C Contact information: [address] [id number] [place]        Philip Lance, MD Follow up.   Specialty: Cardiology Why: 08/29/20 @ 11:15AM Contact information: [address] [address] [phone]                 Duration of Discharge Encounter: Greater than 30 minutes including physician time.  Philip Night, PA-C 07/27/2020 12:12 PM  EP Attending  Patient seen and examined. Agree with above. The patient is doing well this morning, maintaining NSR. His QT is acceptable. 520 by my calc with a QRS of 190. He has an ICD in placed as well. He will be discharged home with the usual followup.   Carleene Overlie Raphel Stickles,MD

## 2020-07-26 NOTE — CV Procedure (Signed)
Procedure:   DCCV  Indication:  Symptomatic atrial fibrillation  Procedure Note:  The patient signed informed consent.  They have had had therapeutic anticoagulation with apixaban greater than 3 weeks.  Anesthesia was administered by Dr. Elgie Congo.  Patient received 80 mg IV lidocaine and 60 mg IV propofol. Adequate airway was maintained throughout and vital followed per protocol.  They were cardioverted x 1 with 120J of biphasic synchronized energy.  They converted to NSR.  There were no apparent complications.  The patient had normal neuro status and respiratory status post procedure with vitals stable as recorded elsewhere.    Follow up:  They will continue on current medical therapy and follow up with cardiology as scheduled.  Buford Dresser, MD PhD 07/26/2020 9:59 AM

## 2020-07-26 NOTE — Progress Notes (Addendum)
Pharmacy: Dofetilide (Tikosyn) - Follow Up Assessment and Electrolyte Replacement  Pharmacy consulted to assist in monitoring and replacing electrolytes in this 70 y.o. male admitted on 07/24/2020 undergoing dofetilide initiation. First dofetilide dose: 07/24/20.   Labs:    Component Value Date/Time   K 4.1 07/26/2020 0306   MG 2.0 07/26/2020 0306    Scr down to 1.19. Calculated crcl>60. Tikosyn to 500 mcg bid 6/21. Qtc stable, ICD present.   Patient aware of $10 copay and wishes to continue using [facility] at discharge for his tikosyn prescription.   Plan: Potassium: K 4.1 - No potassium needed today  Magnesium: Mg 1.8-2: Give Mg 2 gm IV x1    Thank you for allowing pharmacy to participate in this patient's care   Erin Hearing PharmD., BCPS Clinical Pharmacist 07/26/2020 9:00 AM

## 2020-07-26 NOTE — Telephone Encounter (Signed)
Patient is in the hospital but still wanted to talk to Dr. Tanna Furry Nurse regarding his care.

## 2020-07-26 NOTE — Anesthesia Preprocedure Evaluation (Addendum)
Anesthesia Evaluation  Patient identified by MRN, date of birth, ID band Patient awake    Reviewed: Allergy & Precautions, NPO status , Patient's Chart, lab work & pertinent test results  Airway Mallampati: II  TM Distance: >3 FB Neck ROM: Full    Dental no notable dental hx.    Pulmonary sleep apnea , former smoker,    Pulmonary exam normal breath sounds clear to auscultation       Cardiovascular Exercise Tolerance: Good hypertension, +CHF  Normal cardiovascular exam+ dysrhythmias Atrial Fibrillation + pacemaker + Cardiac Defibrillator  Rhythm:Irregular Rate:Normal  EKG reviewed   Neuro/Psych negative neurological ROS  negative psych ROS   GI/Hepatic negative GI ROS, Neg liver ROS,   Endo/Other    Renal/GU Renal InsufficiencyRenal disease  negative genitourinary   Musculoskeletal negative musculoskeletal ROS (+)   Abdominal   Peds  Hematology negative hematology ROS (+)   Anesthesia Other Findings   Reproductive/Obstetrics negative OB ROS                           Anesthesia Physical Anesthesia Plan  ASA: 4  Anesthesia Plan: General   Post-op Pain Management:    Induction:   PONV Risk Score and Plan: 2 and Treatment may vary due to age or medical condition, Ondansetron, Propofol infusion and TIVA  Airway Management Planned: Mask  Additional Equipment: None  Intra-op Plan:   Post-operative Plan:   Informed Consent: I have reviewed the patients History and Physical, chart, labs and discussed the procedure including the risks, benefits and alternatives for the proposed anesthesia with the patient or authorized representative who has indicated his/her understanding and acceptance.     Dental advisory given  Plan Discussed with: Anesthesiologist and CRNA  Anesthesia Plan Comments:        Anesthesia Quick Evaluation

## 2020-07-27 ENCOUNTER — Other Ambulatory Visit (HOSPITAL_COMMUNITY): Payer: Self-pay

## 2020-07-27 ENCOUNTER — Encounter (HOSPITAL_COMMUNITY): Payer: Self-pay | Admitting: Cardiology

## 2020-07-27 LAB — MAGNESIUM: Magnesium: 2.1 mg/dL (ref 1.7–2.4)

## 2020-07-27 LAB — BASIC METABOLIC PANEL
Anion gap: 6 (ref 5–15)
BUN: 16 mg/dL (ref 8–23)
CO2: 25 mmol/L (ref 22–32)
Calcium: 9.1 mg/dL (ref 8.9–10.3)
Chloride: 104 mmol/L (ref 98–111)
Creatinine, Ser: 1.32 mg/dL — ABNORMAL HIGH (ref 0.61–1.24)
GFR, Estimated: 58 mL/min — ABNORMAL LOW (ref >60)
Glucose, Bld: 120 mg/dL — ABNORMAL HIGH (ref 70–99)
Potassium: 3.7 mmol/L (ref 3.5–5.1)
Sodium: 135 mmol/L (ref 135–145)

## 2020-07-27 MED ORDER — POTASSIUM CHLORIDE ER 10 MEQ PO TBCR
10.0000 meq | EXTENDED_RELEASE_TABLET | Freq: Every day | ORAL | 5 refills | Status: DC
  Filled 2020-07-27: qty 30, 30d supply, fill #0

## 2020-07-27 MED ORDER — ISOSORBIDE MONONITRATE ER 30 MG PO TB24
30.0000 mg | ORAL_TABLET | Freq: Every day | ORAL | 3 refills | Status: DC
  Filled 2020-07-27: qty 30, 30d supply, fill #0

## 2020-07-27 MED ORDER — POTASSIUM CHLORIDE CRYS ER 20 MEQ PO TBCR
20.0000 meq | EXTENDED_RELEASE_TABLET | Freq: Once | ORAL | Status: AC
  Administered 2020-07-27: 20 meq via ORAL
  Filled 2020-07-27: qty 1

## 2020-07-27 MED ORDER — POTASSIUM CHLORIDE CRYS ER 20 MEQ PO TBCR
40.0000 meq | EXTENDED_RELEASE_TABLET | Freq: Once | ORAL | Status: AC
  Administered 2020-07-27: 40 meq via ORAL
  Filled 2020-07-27: qty 2

## 2020-07-27 MED ORDER — DOFETILIDE 500 MCG PO CAPS
500.0000 ug | ORAL_CAPSULE | Freq: Two times a day (BID) | ORAL | 5 refills | Status: DC
  Filled 2020-07-27: qty 60, 30d supply, fill #0

## 2020-07-27 NOTE — Progress Notes (Signed)
Pt's QTC resulted at 580 msec after evening dose of Tikosyn & Ahkter, MD made aware. Will continue to monitor.  Elaina Hoops, RN

## 2020-07-27 NOTE — Progress Notes (Signed)
Pt is alert and oriented. Discharge instructions/ AVS given to pt. 

## 2020-07-27 NOTE — Progress Notes (Addendum)
Pharmacy: Dofetilide (Tikosyn) - Follow Up Assessment and Electrolyte Replacement  Pharmacy consulted to assist in monitoring and replacing electrolytes in this 70 y.o. male admitted on 07/24/2020 undergoing dofetilide initiation. First dofetilide dose: 07/25/20  Labs:    Component Value Date/Time   K 3.7 07/27/2020 0226   MG 2.1 07/27/2020 0226     Plan: Potassium: K 3.5-3.7:  Give KCl 60 mEq po x1 in total this morning  Magnesium: Mg > 2: No additional supplementation needed   As patient has required on average 40 mEq of potassium replacement every day, I recommend discharging patient with prescription for: 35meq daily x 5 days then drop down to 75meq daily until seen in follow up. I am hesitant to prescribe 19meq daily as patient is also on spironolactone and entresto.  Scr up to 1.3. Tikosyn to 500 mcg bid 6/21. Qtc stable, ICD present.   Patient aware of $10 copay and wishes to continue using [facility] at discharge for his tikosyn prescription. As of this morning 6/23 they have #120 in stock  Thank you for allowing pharmacy to participate in this patient's care   Erin Hearing PharmD., BCPS Clinical Pharmacist 07/27/2020 7:36 AM

## 2020-07-31 ENCOUNTER — Other Ambulatory Visit (HOSPITAL_COMMUNITY): Payer: Self-pay

## 2020-07-31 MED FILL — Apixaban Tab 5 MG: ORAL | 30 days supply | Qty: 60 | Fill #2 | Status: AC

## 2020-08-02 NOTE — Progress Notes (Signed)
Primary Care Physician: Wilber Oliphant, MD Primary Cardiologist: Dr Aundra Dubin Primary Electrophysiologist: Dr Lovena Le Referring Physician: Dr Aundra Dubin   Philip Chavez is a 70 y.o. 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 Patient is on Eliquis for a CHADS2VASC score of 2. He has had increasing burden of afib noted on his device interrogation. He is unaware of his arrhythmia. He denies any symptoms of fluid overload, Optivol shows normal fluid levels. Of note, he is pending a BiV upgrade with Dr Lovena Le.   On follow up today, patient is s/p dofetilide loading 6/20-6/23/22 with DCCV on 07/26/20. Patient reports that he feels "perfect" today. He is in SR. He denies any missed doses of dofetilide. No bleeding issues on anticoagulation.   Today, he denies symptoms of palpitations, chest pain, shortness of breath, orthopnea, PND, lower extremity edema, dizziness, presyncope, syncope, snoring, daytime somnolence, bleeding, or neurologic sequela. The patient is tolerating medications without difficulties and is otherwise without complaint today.    Atrial Fibrillation Risk Factors:  he does not have symptoms or diagnosis of sleep apnea. he does not have a history of rheumatic fever.   he has a BMI of Body mass index is 26.6 kg/m.Marland Kitchen Filed Weights   08/03/20 1016  Weight: 91.4 kg      Family History  Problem Relation Age of Onset   Diabetes type II Mother    Heart disease Father    Arrhythmia Father    Arrhythmia Sister    Hypertension Neg Hx    Heart attack Neg Hx    Stroke Neg Hx      Atrial Fibrillation Management history:  Previous antiarrhythmic drugs: dofetilide Previous cardioversions: 07/26/21 Previous ablations: 2016 flutter CHADS2VASC score: 2 Anticoagulation history: Eliquis   Past Medical History:  Diagnosis  Date   AICD (automatic cardioverter/defibrillator) present 0919/2016   BIV    Atrial flutter ([facility])    a. 08/2014 presented w/ aflutter->converted on amio;  b. CHA2DS2VASc = 2 -->Xarelto.   Atrial tachycardia (H[facility])    Chronic combined systolic and diastolic CHF (congestive heart failure) ([place])    a. 03/2013 Echo EF 50-55%;  b. 07/2013 Echo EF 20-25%, diff HK, Gr3 DD;  c. 08/2014 Echo: EF 15%, diff HK, mild AI/MR, mildly dil LA/RV, mod TR, PASP 85mmHg.   DVT (deep venous thrombosis) ([facility])    a. 2007 RLE: S/P ankle surgery;  b. 03/2013 LLE DVT & PE-->Xarelto.   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 2007 RLE: S/P ankle surgery;  b. 03/2013 LLE DVT & PE-->Xarelto.   Hypercholesterolemia    Hypercoagulable state ([facility])    Hypertension    Musculoskeletal neck pain    Nonischemic cardiomyopathy ([facility])    a. 07/2013 Echo: EF 20-25%;  b. 07/2013 Myoview: Large inferior, lateral, apical scar w/ HK, no ischemia, EF 17%;  b. 08/2014 Echo: EF 15%, diff HK.   Pneumonia ~ 04/2014; 09/02/2014   Presence of permanent cardiac pacemaker    a. 03/2013 s/p MDT ADDRL1 Adapta DC PPM, ser # [id number].   Sleep apnea    does not wear mask (09/02/2014)   Third degree heart block ([place])    a. 03/2013 s/p MDT ADDRL1 Adapta DC PPM, ser # [id number].   Past Surgical History:  Procedure Laterality Date   CARDIAC CATHETERIZATION N/A 10/17/2014   Procedure: Right/Left Heart Cath and Coronary  Angiography;  Surgeon: Larey Dresser, MD;  Location: [facility] CV LAB;  Service: Cardiovascular;  Laterality: N/A;   CARDIOVERSION N/A 07/26/2020   Procedure: CARDIOVERSION;  Surgeon: Buford Dresser, MD;  Location: [facility] ENDOSCOPY;  Service: Cardiovascular;  Laterality: N/A;   ELECTROPHYSIOLOGIC STUDY N/A 09/07/2014   Procedure: A-Flutter;  Surgeon: Evans Lance, MD;  Location: [facility] CV LAB;  Service: Cardiovascular;  Laterality: N/A;   EP IMPLANTABLE DEVICE  10/24/2014   BIV   EP IMPLANTABLE DEVICE N/A 10/24/2014    Procedure: BiV ICD Upgrade;  Surgeon: Evans Lance, MD;  Location: [facility] CV LAB;  Service: Cardiovascular;  Laterality: N/A;   FOOT FRACTURE SURGERY Right 2007   FRACTURE SURGERY     INGUINAL HERNIA REPAIR Right 1980's   INSERT / REPLACE / REMOVE PACEMAKER     MDT ADDRL1 pacemaker implanted by Dr Lovena Le for complete heart block   PERMANENT PACEMAKER INSERTION N/A 03/15/2013   Procedure: PERMANENT PACEMAKER INSERTION;  Surgeon: Evans Lance, MD;  Location: [facility] CATH LAB;  Service: Cardiovascular;  Laterality: N/A;    Current Outpatient Medications  Medication Sig Dispense Refill   acetaminophen (TYLENOL) 325 MG tablet Take 1-2 tablets (325-650 mg total) by mouth every 4 (four) hours as needed for mild pain.     apixaban (ELIQUIS) 5 MG TABS tablet TAKE 1 TABLET BY MOUTH TWO TIMES DAILY 60 tablet 5   bisoprolol (ZEBETA) 5 MG tablet TAKE 1 TABLET BY MOUTH DAILY. 30 tablet 11   dapagliflozin propanediol (FARXIGA) 10 MG TABS tablet TAKE 1 TABLET BY MOUTH ONCE A DAY BEFORE BREAKFAST 30 tablet 11   digoxin (LANOXIN) 0.125 MG tablet TAKE 1 TABLET BY MOUTH DAILY. 30 tablet 11   dofetilide (TIKOSYN) 500 MCG capsule Take 1 capsule (500 mcg total) by mouth 2 (two) times daily. 60 capsule 5   furosemide (LASIX) 20 MG tablet TAKE 1 TABLET (20 MG TOTAL) 2 (TWO) TIMES DAILY BY MOUTH. 180 tablet 3   hydrALAZINE (APRESOLINE) 25 MG tablet TAKE 1/2 TABLET BY MOUTH THREE TIMES DAILY 135 tablet 3   isosorbide mononitrate (IMDUR) 30 MG 24 hr tablet Take 1 tablet (30 mg total) by mouth daily. 30 tablet 3   Multiple Vitamins-Minerals (CERTAVITE/ANTIOXIDANTS) TABS Take 1 tablet by mouth daily with breakfast.     oxymetazoline (AFRIN) 0.05 % nasal spray Place 1 spray into both nostrils 2 (two) times daily as needed for congestion.     potassium chloride (KLOR-CON) 10 MEQ tablet Take 1 tablet (10 mEq total) by mouth daily. 30 tablet 5   sacubitril-valsartan (ENTRESTO) 24-26 MG TAKE 1 TABLET BY MOUTH 2 TIMES DAILY.  60 tablet 3   simvastatin (ZOCOR) 40 MG tablet TAKE 1 TABLET BY MOUTH EVERY DAY 90 tablet 3   spironolactone (ALDACTONE) 25 MG tablet TAKE 1 TABLET BY MOUTH ONCE A DAY 90 tablet 1   No current facility-administered medications for this encounter.    No Known Allergies  Social History   Socioeconomic History   Marital status: Married    Spouse name: Luellen Pucker   Number of children: 2   Years of education: Not on file   Highest education level: Not on file  Occupational History   Not on file  Tobacco Use   Smoking status: Former    Packs/day: 1.50    Years: 10.00    Pack years: 15.00    Types: Cigarettes   Smokeless tobacco: Never   Tobacco comments:    09/02/2014  "quit  smoking 20-30 yr ago"  Vaping Use   Vaping Use: Never used  Substance and Sexual Activity   Alcohol use: No   Drug use: No   Sexual activity: Yes  Other Topics Concern   Not on file  Social History Narrative   Not on file   Social Determinants of Health   Financial Resource Strain: Not on file  Food Insecurity: Not on file  Transportation Needs: Not on file  Physical Activity: Not on file  Stress: Not on file  Social Connections: Not on file  Intimate Partner Violence: Not on file     ROS- All systems are reviewed and negative except as per the HPI above.  Physical Exam: Vitals:   08/03/20 1016  BP: 100/80  Pulse: 66  Weight: 91.4 kg  Height: 6\' 1"  (1.854 m)    GEN- The patient is a well appearing male, alert and oriented x 3 today.   HEENT-head normocephalic, atraumatic, sclera clear, conjunctiva pink, hearing intact, trachea midline. Lungs- Clear to ausculation bilaterally, normal work of breathing Heart- Regular rate and rhythm, occasional ectopic beat, no murmurs, rubs or gallops  GI- soft, NT, ND, + BS Extremities- no clubbing, cyanosis, or edema MS- no significant deformity or atrophy Skin- no rash or lesion Psych- euthymic mood, full affect Neuro- strength and sensation are  intact   Wt Readings from Last 3 Encounters:  08/03/20 91.4 kg  07/26/20 91.6 kg  07/24/20 92.2 kg    EKG today demonstrates  A sense V paced rhythm Vent. rate 66 BPM PR interval 156 ms QRS duration 186 ms QT/QTcB 486/509 ms  Echo 02/02/20 demonstrated  1. Left ventricular ejection fraction, by estimation, is <20%. The left  ventricle has severely decreased function. The left ventricle demonstrates  global hypokinesis. The left ventricular internal cavity size was  moderately dilated. There is mild left  ventricular hypertrophy. Left ventricular diastolic parameters are  consistent with Grade II diastolic dysfunction (pseudonormalization).   2. Right ventricular systolic function is mildly reduced. The right  ventricular size is normal. There is normal pulmonary artery systolic  pressure. The estimated right ventricular systolic pressure is 69.6 mmHg.   3. Left atrial size was moderately dilated.   4. The mitral valve is normal in structure. Trivial mitral valve  regurgitation. No evidence of mitral stenosis.   5. The aortic valve is tricuspid. Aortic valve regurgitation is trivial.  No aortic stenosis is present.   6. Aortic dilatation noted. There is mild dilatation of the aortic root,  measuring 41 mm.   7. The inferior vena cava is dilated in size with >50% respiratory  variability, suggesting right atrial pressure of 8 mmHg.   Epic records are reviewed at length today  CHA2DS2-VASc Score = 2  The patient's score is based upon: CHF History: Yes HTN History: No Diabetes History: No Stroke History: No Vascular Disease History: No Age Score: 1 Gender Score: 0      ASSESSMENT AND PLAN: 1. Persistent Atrial Fibrillation/atrial flutter The patient's CHA2DS2-VASc score is 2, indicating a 2.2% annual risk of stroke.   S/p dofetilide admission 6/20-6/23/22 with DCCV on 07/26/21 Patient appears to be maintaining SR.  Continue dofetilide 500 mcg BID. QT stable  accounting for V pacing. Check bmet/cbc Continue Eliquis 5 mg BID  2. Secondary Hypercoagulable State (ICD10:  D68.69) The patient is at significant risk for stroke/thromboembolism based upon his CHA2DS2-VASc Score of 2.  Continue Apixaban (Eliquis).   3. Chronic systolic CHF No signs or  symptoms of fluid overload.  4. CHB S/p PPM, followed by Dr Lovena Le and the device clinic.   Follow up with Dr Lovena Le as scheduled.    [facility] [address] [place], [place] [address] [phone] 08/03/2020 10:28 AM

## 2020-08-03 ENCOUNTER — Encounter (HOSPITAL_COMMUNITY): Payer: Self-pay | Admitting: Physician Assistant

## 2020-08-03 ENCOUNTER — Ambulatory Visit (HOSPITAL_COMMUNITY)
Admit: 2020-08-03 | Discharge: 2020-08-03 | Disposition: A | Discharge status: Home / Self Care | Payer: Medicare PPO | Source: Ambulatory Visit | Attending: Physician Assistant | Admitting: Physician Assistant

## 2020-08-03 ENCOUNTER — Other Ambulatory Visit: Payer: Self-pay

## 2020-08-03 ENCOUNTER — Other Ambulatory Visit (HOSPITAL_COMMUNITY): Payer: Self-pay

## 2020-08-03 VITALS — BP 100/80 | HR 66 | Ht 73.0 in | Wt 201.6 lb

## 2020-08-03 DIAGNOSIS — Z87891 Personal history of nicotine dependence: Secondary | ICD-10-CM | POA: Diagnosis not present

## 2020-08-03 DIAGNOSIS — Z79899 Other long term (current) drug therapy: Secondary | ICD-10-CM | POA: Diagnosis not present

## 2020-08-03 DIAGNOSIS — Z950 Presence of cardiac pacemaker: Secondary | ICD-10-CM | POA: Diagnosis not present

## 2020-08-03 DIAGNOSIS — Z7901 Long term (current) use of anticoagulants: Secondary | ICD-10-CM | POA: Diagnosis not present

## 2020-08-03 DIAGNOSIS — I4819 Other persistent atrial fibrillation: Secondary | ICD-10-CM | POA: Insufficient documentation

## 2020-08-03 DIAGNOSIS — Z8249 Family history of ischemic heart disease and other diseases of the circulatory system: Secondary | ICD-10-CM | POA: Insufficient documentation

## 2020-08-03 DIAGNOSIS — D6869 Other thrombophilia: Secondary | ICD-10-CM | POA: Diagnosis not present

## 2020-08-03 DIAGNOSIS — I5022 Chronic systolic (congestive) heart failure: Secondary | ICD-10-CM | POA: Insufficient documentation

## 2020-08-03 DIAGNOSIS — I4892 Unspecified atrial flutter: Secondary | ICD-10-CM | POA: Diagnosis not present

## 2020-08-03 LAB — BASIC METABOLIC PANEL
Anion gap: 8 (ref 5–15)
BUN: 11 mg/dL (ref 8–23)
CO2: 24 mmol/L (ref 22–32)
Calcium: 9.1 mg/dL (ref 8.9–10.3)
Chloride: 107 mmol/L (ref 98–111)
Creatinine, Ser: 1.27 mg/dL — ABNORMAL HIGH (ref 0.61–1.24)
GFR, Estimated: >60 mL/min (ref >60)
Glucose, Bld: 121 mg/dL — ABNORMAL HIGH (ref 70–99)
Potassium: 3.9 mmol/L (ref 3.5–5.1)
Sodium: 139 mmol/L (ref 135–145)

## 2020-08-03 LAB — MAGNESIUM: Magnesium: 2 mg/dL (ref 1.7–2.4)

## 2020-08-03 MED FILL — Spironolactone Tab 25 MG: ORAL | 90 days supply | Qty: 90 | Fill #0 | Status: AC

## 2020-08-04 ENCOUNTER — Other Ambulatory Visit (HOSPITAL_COMMUNITY): Payer: Self-pay | Admitting: *Deleted

## 2020-08-04 ENCOUNTER — Other Ambulatory Visit (HOSPITAL_COMMUNITY): Payer: Self-pay

## 2020-08-04 MED ORDER — POTASSIUM CHLORIDE ER 10 MEQ PO TBCR: 10.0000 meq | EXTENDED_RELEASE_TABLET | Freq: Two times a day (BID) | ORAL | 5 refills | Status: DC

## 2020-08-04 MED FILL — Dapagliflozin Propanediol Tab 10 MG (Base Equivalent): ORAL | 30 days supply | Qty: 30 | Fill #2 | Status: AC

## 2020-08-10 ENCOUNTER — Other Ambulatory Visit (HOSPITAL_COMMUNITY): Payer: Self-pay

## 2020-08-10 ENCOUNTER — Other Ambulatory Visit: Payer: Self-pay

## 2020-08-16 ENCOUNTER — Telehealth: Payer: Self-pay | Admitting: Internal Medicine

## 2020-08-16 ENCOUNTER — Other Ambulatory Visit (HOSPITAL_COMMUNITY): Payer: Self-pay

## 2020-08-16 ENCOUNTER — Ambulatory Visit (INDEPENDENT_AMBULATORY_CARE_PROVIDER_SITE_OTHER): Payer: Medicare PPO

## 2020-08-16 DIAGNOSIS — I428 Other cardiomyopathies: Secondary | ICD-10-CM

## 2020-08-16 LAB — CUP PACEART REMOTE DEVICE CHECK
Battery Remaining Longevity: 12 mo
Battery Voltage: 2.89 V
Brady Statistic AP VP Percent: 8.11 %
Brady Statistic AP VS Percent: 0 %
Brady Statistic AS VP Percent: 91.59 %
Brady Statistic AS VS Percent: 0.3 %
Brady Statistic RA Percent Paced: 7.87 %
Brady Statistic RV Percent Paced: 99.33 %
Date Time Interrogation Session: 20220713022604
HighPow Impedance: 69 Ohm
Implantable Lead Implant Date: 20150209
Implantable Lead Implant Date: 20160919
Implantable Lead Implant Date: 20160919
Implantable Lead Location: 753858
Implantable Lead Location: 753859
Implantable Lead Location: 753860
Implantable Lead Model: 4598
Implantable Lead Model: 5076
Implantable Lead Model: 6935
Implantable Pulse Generator Implant Date: 20160919
Lead Channel Impedance Value: 304 Ohm
Lead Channel Impedance Value: 323 Ohm
Lead Channel Impedance Value: 342 Ohm
Lead Channel Impedance Value: 342 Ohm
Lead Channel Impedance Value: 342 Ohm
Lead Channel Impedance Value: 380 Ohm
Lead Channel Impedance Value: 437 Ohm
Lead Channel Impedance Value: 456 Ohm
Lead Channel Impedance Value: 513 Ohm
Lead Channel Impedance Value: 532 Ohm
Lead Channel Impedance Value: 532 Ohm
Lead Channel Impedance Value: 570 Ohm
Lead Channel Impedance Value: 570 Ohm
Lead Channel Pacing Threshold Amplitude: 0.75 V
Lead Channel Pacing Threshold Amplitude: 1.125 V
Lead Channel Pacing Threshold Amplitude: 2.5 V
Lead Channel Pacing Threshold Pulse Width: 0.4 ms
Lead Channel Pacing Threshold Pulse Width: 0.4 ms
Lead Channel Pacing Threshold Pulse Width: 0.4 ms
Lead Channel Sensing Intrinsic Amplitude: 1.875 mV
Lead Channel Sensing Intrinsic Amplitude: 1.875 mV
Lead Channel Sensing Intrinsic Amplitude: 10.875 mV
Lead Channel Sensing Intrinsic Amplitude: 10.875 mV
Lead Channel Setting Pacing Amplitude: 1.5 V
Lead Channel Setting Pacing Amplitude: 2.25 V
Lead Channel Setting Pacing Pulse Width: 0.4 ms
Lead Channel Setting Sensing Sensitivity: 0.3 mV

## 2020-08-16 MED ORDER — DOFETILIDE 500 MCG PO CAPS
500.0000 ug | ORAL_CAPSULE | Freq: Two times a day (BID) | ORAL | 4 refills | Status: DC
  Filled 2020-08-25: qty 60, 30d supply, fill #0
  Filled 2020-09-26: qty 60, 30d supply, fill #1
  Filled 2020-10-29: qty 60, 30d supply, fill #2
  Filled 2020-11-28: qty 60, 30d supply, fill #3
  Filled 2020-12-28: qty 60, 30d supply, fill #4

## 2020-08-16 MED ORDER — SACUBITRIL-VALSARTAN 24-26 MG PO TABS
ORAL_TABLET | ORAL | 0 refills | Status: DC
  Filled 2020-08-16: qty 60, 30d supply, fill #0

## 2020-08-16 MED ORDER — APIXABAN 5 MG PO TABS
5.0000 mg | ORAL_TABLET | Freq: Two times a day (BID) | ORAL | 1 refills | Status: DC
  Filled 2020-09-01: qty 60, 30d supply, fill #0
  Filled 2020-10-07: qty 60, 30d supply, fill #1

## 2020-08-16 MED ORDER — APIXABAN 5 MG PO TABS
5.0000 mg | ORAL_TABLET | Freq: Two times a day (BID) | ORAL | 0 refills | Status: DC
  Filled 2020-08-16: qty 150, 75d supply, fill #0

## 2020-08-16 MED ORDER — HYDRALAZINE HCL 25 MG PO TABS
40.0000 mg | ORAL_TABLET | Freq: Three times a day (TID) | ORAL | 0 refills | Status: DC
  Filled 2020-08-17 (×2): qty 135, 30d supply, fill #0
  Filled 2020-12-16: qty 135, 30d supply, fill #1
  Filled 2021-01-18: qty 135, 90d supply, fill #2

## 2020-08-16 MED ORDER — BISOPROLOL FUMARATE 5 MG PO TABS
5.0000 mg | ORAL_TABLET | Freq: Every day | ORAL | 9 refills | Status: DC
  Filled 2020-08-17: qty 30, 30d supply, fill #0
  Filled 2020-09-16: qty 30, 30d supply, fill #1

## 2020-08-16 MED ORDER — DAPAGLIFLOZIN PROPANEDIOL 10 MG PO TABS
ORAL_TABLET | ORAL | 4 refills | Status: DC
  Filled 2020-09-02: qty 30, 30d supply, fill #0
  Filled 2020-10-05: qty 30, 30d supply, fill #1
  Filled 2020-11-06: qty 30, 30d supply, fill #2
  Filled 2020-12-04: qty 30, 30d supply, fill #3
  Filled 2021-01-02: qty 30, 30d supply, fill #4

## 2020-08-16 MED ORDER — ISOSORBIDE MONONITRATE ER 30 MG PO TB24
30.0000 mg | ORAL_TABLET | Freq: Every day | ORAL | 2 refills | Status: DC
  Filled 2020-08-23: qty 30, 30d supply, fill #0
  Filled 2020-09-24: qty 30, 30d supply, fill #1
  Filled 2020-10-23: qty 30, 30d supply, fill #2

## 2020-08-16 MED ORDER — DIGOXIN 125 MCG PO TABS
0.1250 mg | ORAL_TABLET | Freq: Every day | ORAL | 0 refills | Status: DC
  Filled 2020-08-16: qty 30, 30d supply, fill #0
  Filled 2020-09-13: qty 30, 30d supply, fill #1
  Filled 2020-10-16: qty 30, 30d supply, fill #2
  Filled 2020-11-16: qty 30, 30d supply, fill #3
  Filled 2020-12-16: qty 30, 30d supply, fill #4
  Filled 2021-01-18: qty 30, 30d supply, fill #5
  Filled 2021-02-17: qty 30, 30d supply, fill #6
  Filled 2021-03-20: qty 30, 30d supply, fill #7
  Filled 2021-04-17: qty 30, 30d supply, fill #8
  Filled 2021-05-20: qty 30, 30d supply, fill #9

## 2020-08-16 MED ORDER — SACUBITRIL-VALSARTAN 24-26 MG PO TABS
1.0000 | ORAL_TABLET | Freq: Two times a day (BID) | ORAL | 0 refills | Status: DC
  Filled 2020-09-01: qty 60, 30d supply, fill #0

## 2020-08-16 NOTE — Telephone Encounter (Signed)
   [facility] Pre-operative Risk Assessment    Patient Name: Philip Chavez  DOB: [birth year] MRN: [id number]  HEARTCARE STAFF:  - IMPORTANT!!!!!! Under Visit Info/Reason for Call, type in Other and utilize the format Clearance MM/DD/YY or Clearance TBD. Do not use dashes or single digits. - Please review there is not already an duplicate clearance open for this procedure. - If request is for dental extraction, please clarify the # of teeth to be extracted. - If the patient is currently at the dentist's office, call Pre-Op Callback Staff (MA/nurse) to input urgent request.  - If the patient is not currently in the dentist office, please route to the Pre-Op pool.  Request for surgical clearance:  What type of surgery is being performed? Right hernia repair  When is this surgery scheduled? TBD  What type of clearance is required (medical clearance vs. Pharmacy clearance to hold med vs. Both)? Both  Are there any medications that need to be held prior to surgery and how long? Eliquis, 2 days prior  Practice name and name of physician performing surgery? [facility], Dr. Rosendo Gros  What is the office phone number? [phone]   7.   What is the office fax number? [phone]  8.   Anesthesia type (None, local, MAC, general) ? general   Philip Chavez 08/16/2020, 8:42 AM  _________________________________________________________________   (provider comments below)   Abigail Butts, RN, with [facility] states the patient came into their office requesting an appointment to remove his hernia in his groin. She states she spoke with Dr. Claris Gladden office about holding the eliquis and was told to call our office.

## 2020-08-16 NOTE — Telephone Encounter (Signed)
Patient with diagnosis of atrial fibrillation on Eliquis for anticoagulation.    Procedure: hernia repair Date of procedure: TBD   CHA2DS2-VASc Score = 2  This indicates a 2.2% annual risk of stroke. The patient's score is based upon: CHF History: Yes HTN History: No Diabetes History: No Stroke History: No Vascular Disease History: No Age Score: 1 Gender Score: 0   Patient also has history of prior DVT/PE Feb 2015  CrCl 72.9 Platelet count 164  Per office protocol, patient can hold Eliquis for 2 days prior to procedure.   Patient will not need bridging with Lovenox (enoxaparin) around procedure.

## 2020-08-17 ENCOUNTER — Other Ambulatory Visit (HOSPITAL_COMMUNITY): Payer: Self-pay | Admitting: *Deleted

## 2020-08-17 ENCOUNTER — Other Ambulatory Visit (HOSPITAL_COMMUNITY): Payer: Self-pay

## 2020-08-18 ENCOUNTER — Other Ambulatory Visit (HOSPITAL_COMMUNITY): Payer: Self-pay

## 2020-08-18 NOTE — Telephone Encounter (Signed)
   Name: Philip Chavez  DOB: 1950/09/12  MRN: [id number]   Primary Cardiologist: Loralie Champagne, MD  Chart reviewed as part of pre-operative protocol coverage. Patient was contacted 08/18/2020 in reference to pre-operative risk assessment for pending surgery as outlined below.  Philip Chavez was last seen on 08/03/20 by Adline Peals, PA-C.  Since that day, Philip Chavez has done fine. He can complete 4 METs without anginal complaints.  Therefore, based on ACC/AHA guidelines, the patient would be at acceptable risk for the planned procedure without further cardiovascular testing.   The patient was advised that if he develops new symptoms prior to surgery to contact our office to arrange for a follow-up visit, and he verbalized understanding.  Per pharmacy recommendations, patient can hold eliquis 2 days prior to his upcoming procedure with plans to restart when cleared to do so by his surgeon. Given recent DCCV 07/26/20, patient's procedure should not be scheduled until on, or after, 08/25/20 to allow 4 weeks of uninterrupted anticoagulation following DCCV.  I will route this recommendation to the requesting party via Epic fax function and remove from pre-op pool. Please call with questions.  Abigail Butts, PA-C 08/18/2020, 2:22 PM

## 2020-08-21 ENCOUNTER — Telehealth: Payer: Self-pay | Admitting: Cardiology

## 2020-08-21 ENCOUNTER — Ambulatory Visit (INDEPENDENT_AMBULATORY_CARE_PROVIDER_SITE_OTHER): Payer: Medicare PPO

## 2020-08-21 DIAGNOSIS — I5022 Chronic systolic (congestive) heart failure: Secondary | ICD-10-CM

## 2020-08-21 DIAGNOSIS — Z95810 Presence of automatic (implantable) cardiac defibrillator: Secondary | ICD-10-CM | POA: Diagnosis not present

## 2020-08-22 NOTE — Progress Notes (Signed)
EPIC Encounter for ICM Monitoring  Patient Name: Philip Chavez is a 70 y.o. male Date: 08/22/2020 Primary Care Physican: Ezequiel Essex, MD Primary Cardiologist: Aundra Dubin Electrophysiologist: Lovena Le RV Pacing:  99.4%        08/22/2020 Weight: 203-205 lbs   Since 16-Aug-2020 Time in AT/AF <0.1 hr/day (<0.1%)                           Spoke with patient and reports feeling well at this time. Heart failure questions reviewed. Pt asymptomatic.  Cardioversion was 07/06/2020   OptiVol Thoracic impedance suggesting normal fluid levels.     Prescribed: Furosemide 20 mg 1 tablet twice a day.   Labs: 05/03/2020 Creatinine 1.38, BUN 11, Potassium 3.8, Sodium 139, GFR 55 02/16/2020 Creatinine 1.33, BUN 15, Potassium 3.8, Sodium 138, GFR 58 A complete set of results can be found in Results Review.   Recommendations:  No changes and encouraged to call if experiencing any fluid symptoms.   Follow-up plan: ICM clinic phone appointment on 08/21/2020.   91 day device clinic remote transmission 11/15/2020.     EP/Cardiology Office Visits:  08/29/2020 with Dr Lovena Le.    Copy of ICM check sent to Dr. Lovena Le.   3 month ICM trend: 08/21/2020.    1 Year ICM trend:       Rosalene Billings, RN 08/22/2020 2:05 PM

## 2020-08-23 ENCOUNTER — Other Ambulatory Visit (HOSPITAL_COMMUNITY): Payer: Self-pay

## 2020-08-25 ENCOUNTER — Other Ambulatory Visit (HOSPITAL_COMMUNITY): Payer: Self-pay

## 2020-08-25 ENCOUNTER — Other Ambulatory Visit: Payer: Self-pay | Admitting: Physician Assistant

## 2020-08-29 ENCOUNTER — Other Ambulatory Visit: Payer: Self-pay

## 2020-08-29 ENCOUNTER — Encounter: Payer: Self-pay | Admitting: Internal Medicine

## 2020-08-29 ENCOUNTER — Ambulatory Visit: Payer: Medicare PPO | Admitting: Internal Medicine

## 2020-08-29 VITALS — BP 122/70 | HR 66 | Ht 73.0 in | Wt 203.0 lb

## 2020-08-29 DIAGNOSIS — I4819 Other persistent atrial fibrillation: Secondary | ICD-10-CM | POA: Diagnosis not present

## 2020-08-29 DIAGNOSIS — I442 Atrioventricular block, complete: Secondary | ICD-10-CM | POA: Diagnosis not present

## 2020-08-29 DIAGNOSIS — I5022 Chronic systolic (congestive) heart failure: Secondary | ICD-10-CM | POA: Diagnosis not present

## 2020-08-29 DIAGNOSIS — Z950 Presence of cardiac pacemaker: Secondary | ICD-10-CM

## 2020-08-29 NOTE — Progress Notes (Signed)
HPI [name] returns today for followup. He was admitted for initiation of dofetilide. He feels well and denies chest pain or sob. He has not had any palpitations. He has class 1 CHF symptoms. No edema. No PND and no orthopnea.  No Known Allergies   Current Outpatient Medications  Medication Sig Dispense Refill   acetaminophen (TYLENOL) 325 MG tablet Take 1-2 tablets (325-650 mg total) by mouth every 4 (four) hours as needed for mild pain.     apixaban (ELIQUIS) 5 MG TABS tablet TAKE 1 TABLET BY MOUTH TWO TIMES DAILY 60 tablet 5   apixaban (ELIQUIS) 5 MG TABS tablet Take 1 tablet (5 mg total) by mouth 2 (two) times daily. 120 tablet 0   apixaban (ELIQUIS) 5 MG TABS tablet Take 1 tablet (5 mg total) by mouth 2 (two) times daily. 60 tablet 1   bisoprolol (ZEBETA) 5 MG tablet TAKE 1 TABLET BY MOUTH DAILY. 30 tablet 11   bisoprolol (ZEBETA) 5 MG tablet Take 1 tablet (5 mg total) by mouth daily. 30 tablet 9   dapagliflozin propanediol (FARXIGA) 10 MG TABS tablet TAKE 1 TABLET BY MOUTH ONCE A DAY BEFORE BREAKFAST 30 tablet 11   dapagliflozin propanediol (FARXIGA) 10 MG TABS tablet Take by mouth once daily before breakfast 30 tablet 4   digoxin (LANOXIN) 0.125 MG tablet TAKE 1 TABLET BY MOUTH DAILY. 30 tablet 11   digoxin (LANOXIN) 0.125 MG tablet Take 1 tablet (0.125 mg total) by mouth daily. 300 tablet 0   dofetilide (TIKOSYN) 500 MCG capsule Take 1 capsule (500 mcg total) by mouth 2 (two) times daily. 60 capsule 5   dofetilide (TIKOSYN) 500 MCG capsule Take 1 capsule (500 mcg total) by mouth 2 (two) times daily. 60 capsule 4   furosemide (LASIX) 20 MG tablet TAKE 1 TABLET (20 MG TOTAL) 2 (TWO) TIMES DAILY BY MOUTH. 180 tablet 3   hydrALAZINE (APRESOLINE) 25 MG tablet TAKE 1/2 TABLET BY MOUTH THREE TIMES DAILY 135 tablet 3   hydrALAZINE (APRESOLINE) 25 MG tablet Take 1/2 tablet by mouth 3 times daily. 405 tablet 0   isosorbide mononitrate (IMDUR) 30 MG 24 hr tablet Take 1 tablet (30 mg  total) by mouth daily. 30 tablet 3   isosorbide mononitrate (IMDUR) 30 MG 24 hr tablet Take 1 tablet (30 mg total) by mouth daily. 30 tablet 2   Multiple Vitamins-Minerals (CERTAVITE/ANTIOXIDANTS) TABS Take 1 tablet by mouth daily with breakfast.     oxymetazoline (AFRIN) 0.05 % nasal spray Place 1 spray into both nostrils 2 (two) times daily as needed for congestion.     potassium chloride (KLOR-CON) 10 MEQ tablet Take 1 tablet (10 mEq total) by mouth 2 (two) times daily. 60 tablet 5   sacubitril-valsartan (ENTRESTO) 24-26 MG TAKE 1 TABLET BY MOUTH 2 TIMES DAILY. 60 tablet 3   sacubitril-valsartan (ENTRESTO) 24-26 MG Take by mouth twice a day 60 tablet 0   sacubitril-valsartan (ENTRESTO) 24-26 MG Take 1 tablet by mouth 2 (two) times daily. 60 tablet 0   simvastatin (ZOCOR) 40 MG tablet TAKE 1 TABLET BY MOUTH EVERY DAY 90 tablet 3   spironolactone (ALDACTONE) 25 MG tablet TAKE 1 TABLET BY MOUTH ONCE A DAY 90 tablet 1   No current facility-administered medications for this visit.     Past Medical History:  Diagnosis Date   AICD (automatic cardioverter/defibrillator) present 0919/2016   BIV    Atrial flutter ([place])    a. 08/2014 presented w/ aflutter->converted  on amio;  b. CHA2DS2VASc = 2 -->Xarelto.   Atrial tachycardia (H[facility])    Chronic combined systolic and diastolic CHF (congestive heart failure) ([place])    a. 03/2013 Echo EF 50-55%;  b. 07/2013 Echo EF 20-25%, diff HK, Gr3 DD;  c. 08/2014 Echo: EF 15%, diff HK, mild AI/MR, mildly dil LA/RV, mod TR, PASP 52mHg.   DVT (deep venous thrombosis) ([facility]    a. 2007 RLE: S/P ankle surgery;  b. 03/2013 LLE DVT & PE-->Xarelto.   Embolism, pulmonary with infarction ([facility]    a. 2007 RLE: S/P ankle surgery;  b. 03/2013 LLE DVT & PE-->Xarelto.   Hypercholesterolemia    Hypercoagulable state ([place]    Hypertension    Musculoskeletal neck pain    Nonischemic cardiomyopathy ([facility]    a. 07/2013 Echo: EF 20-25%;  b. 07/2013 Myoview: Large inferior, lateral,  apical scar w/ HK, no ischemia, EF 17%;  b. 08/2014 Echo: EF 15%, diff HK.   Pneumonia ~ 04/2014; 09/02/2014   Presence of permanent cardiac pacemaker    a. 03/2013 s/p MDT ADDRL1 Adapta DC PPM, ser # [id number].   Sleep apnea    does not wear mask (09/02/2014)   Third degree heart block ([place]    a. 03/2013 s/p MDT ADDRL1 Adapta DC PPM, ser # [id number].    ROS:   All systems reviewed and negative except as noted in the HPI.   Past Surgical History:  Procedure Laterality Date   CARDIAC CATHETERIZATION N/A 10/17/2014   Procedure: Right/Left Heart Cath and Coronary Angiography;  Surgeon: DLarey Dresser MD;  [facility];  Service: Cardiovascular;  Laterality: N/A;   CARDIOVERSION N/A 07/26/2020   Procedure: CARDIOVERSION;  Surgeon: CBuford Dresser MD;  [facility];  Service: Cardiovascular;  Laterality: N/A;   ELECTROPHYSIOLOGIC STUDY N/A 09/07/2014   Procedure: A-Flutter;  Surgeon: GEvans Lance MD;  [facility];  Service: Cardiovascular;  Laterality: N/A;   EP IMPLANTABLE DEVICE  10/24/2014   BIV   EP IMPLANTABLE DEVICE N/A 10/24/2014   Procedure: BiV ICD Upgrade;  Surgeon: GEvans Lance MD;  [facility];  Service: Cardiovascular;  Laterality: N/A;   FOOT FRACTURE SURGERY Right 2007   FRACTURE SURGERY     INGUINAL HERNIA REPAIR Right 1980's   INSERT / REPLACE / REMOVE PACEMAKER     MDT ADDRL1 pacemaker implanted by Dr TLovena Lefor complete heart block   PERMANENT PACEMAKER INSERTION N/A 03/15/2013   Procedure: PERMANENT PACEMAKER INSERTION;  Surgeon: GEvans Lance MD;  [facility];  Service: Cardiovascular;  Laterality: N/A;     Family History  Problem Relation Age of Onset   Diabetes type II Mother    Heart disease Father    Arrhythmia Father    Arrhythmia Sister    Hypertension Neg Hx    Heart attack Neg Hx    Stroke Neg Hx      Social History   Socioeconomic History   Marital status: Married     Spouse name: ALuellen Chavez  Number of children: 2   Years of education: Not on file   Highest education level: Not on file  Occupational History   Not on file  Tobacco Use   Smoking status: Former    Packs/day: 1.50    Years: 10.00    Pack years: 15.00    Types: Cigarettes   Smokeless tobacco: Never   Tobacco comments:    09/02/2014  "quit smoking  20-30 yr ago"  Vaping Use   Vaping Use: Never used  Substance and Sexual Activity   Alcohol use: No   Drug use: No   Sexual activity: Yes  Other Topics Concern   Not on file  Social History Narrative   Not on file   Social Determinants of Health   Financial Resource Strain: Not on file  Food Insecurity: Not on file  Transportation Needs: Not on file  Physical Activity: Not on file  Stress: Not on file  Social Connections: Not on file  Intimate Partner Violence: Not on file     BP 122/70   Pulse 66   Ht '6\' 1"'$  (1.854 m)   Wt 203 lb (92.1 kg)   SpO2 96%   BMI 26.78 kg/m   Physical Exam:  Well appearing NAD HEENT: Unremarkable Neck:  No JVD, no thyromegally Lymphatics:  No adenopathy Back:  No CVA tenderness Lungs:  Clear HEART:  Regular rate rhythm, no murmurs, no rubs, no clicks Abd:  soft, positive bowel sounds, no organomegally, no rebound, no guarding Ext:  2 plus pulses, no edema, no cyanosis, no clubbing Skin:  No rashes no nodules Neuro:  CN II through XII intact, motor grossly intact  EKG - NSR with ventricular pacing  DEVICE  Normal device function.  See PaceArt for details.   Assess/Plan:  Persistent atrial fib - he is maintaining NSR. He will continue dofetilide.  Chronic systolic heart failure - he is much improved on dofetilide. I would not recommend a lead extraction/revision at this point.  CHB - he is asymptomatic, s/p PPM Insertion. Coags - he denies missing his meds and has had no bleeding.  Carleene Overlie Ota Ebersole,MD

## 2020-08-29 NOTE — Patient Instructions (Addendum)
Medication Instructions:  Your physician recommends that you continue on your current medications as directed. Please refer to the Current Medication list given to you today.  Labwork: None ordered.  Testing/Procedures: None ordered.  Follow-Up: Your physician wants you to follow-up in: 6 months with [name], MD or one of the following Advanced Practice Providers on your designated Care Team:   Tommye Standard, [place] Legrand Como "Jonni Sanger" Chalmers Cater, [place]  Remote monitoring is used to monitor your ICD from home. This monitoring reduces the number of office visits required to check your device to one time per year. It allows [place] to keep an eye on the functioning of your device to ensure it is working properly. You are scheduled for a device check from home on 09/25/2020. You may send your transmission at any time that day. If you have a wireless device, the transmission will be sent automatically. After your physician reviews your transmission, you will receive a postcard with your next transmission date.  Any Other Special Instructions Will Be Listed Below (If Applicable).  If you need a refill on your cardiac medications before your next appointment, please call your pharmacy.

## 2020-09-01 ENCOUNTER — Other Ambulatory Visit (HOSPITAL_COMMUNITY): Payer: Self-pay

## 2020-09-02 ENCOUNTER — Other Ambulatory Visit (HOSPITAL_COMMUNITY): Payer: Self-pay

## 2020-09-06 ENCOUNTER — Ambulatory Visit: Payer: Self-pay | Admitting: General Surgery

## 2020-09-06 DIAGNOSIS — K4091 Unilateral inguinal hernia, without obstruction or gangrene, recurrent: Secondary | ICD-10-CM | POA: Diagnosis not present

## 2020-09-06 NOTE — H&P (View-Only) (Signed)
Chief Complaint: No chief complaint on file.       History of Present Illness: Philip Chavez is a 70 y.o. male who is seen today as an office consultation at the request of Dr. No ref. provider found for evaluation of No chief complaint on file.   Patient here for follow-up for right inguinal hernia. Patient since subsequent visit underwent lead replacement by his cardiologist. Patient has been cleared by cardiology hold his Eliquis for 2 days prior to surgery. Patient continues with right inguinal hernia discomfort.   Review of Systems: A complete review of systems was obtained from the patient.  I have reviewed this information and discussed as appropriate with the patient.  See HPI as well for other ROS.   Review of Systems  Constitutional: Negative for fever.  HENT: Negative for congestion.   Eyes: Negative for blurred vision.  Respiratory: Negative for cough, shortness of breath and wheezing.   Cardiovascular: Negative for chest pain and palpitations.  Gastrointestinal: Negative for heartburn.  Genitourinary: Negative for dysuria.  Musculoskeletal: Negative for myalgias.  Skin: Negative for rash.  Neurological: Negative for dizziness and headaches.  Psychiatric/Behavioral: Negative for depression and suicidal ideas.  All other systems reviewed and are negative.       Medical History: Past Medical History No past medical history on file.    There is no problem list on file for this patient.     Past Surgical History No past surgical history on file.     Allergies Not on File    No current outpatient medications on file prior to visit.   No current facility-administered medications on file prior to visit.     Family History No family history on file.     Social History   Tobacco Use Smoking Status Not on file Smokeless Tobacco Not on file     Social History Social History    Socioeconomic History  Marital status: Unknown      Objective:      There were no vitals filed for this visit.  There is no height or weight on file to calculate BMI.   Physical Exam Constitutional:      Appearance: Normal appearance.  HENT:     Head: Normocephalic and atraumatic.     Nose: Nose normal. No congestion.     Mouth/Throat:     Mouth: Mucous membranes are moist.     Pharynx: Oropharynx is clear.  Eyes:     Pupils: Pupils are equal, round, and reactive to light.  Cardiovascular:     Rate and Rhythm: Normal rate and regular rhythm.     Pulses: Normal pulses.     Heart sounds: Normal heart sounds. No murmur heard.   No friction rub. No gallop.  Pulmonary:     Effort: Pulmonary effort is normal. No respiratory distress.     Breath sounds: Normal breath sounds. No stridor. No wheezing, rhonchi or rales.  Abdominal:     General: Abdomen is flat.     Hernia: A hernia is present. Hernia is present in the right inguinal area.  Musculoskeletal:        General: Normal range of motion.     Cervical back: Normal range of motion.  Skin:    General: Skin is warm and dry.  Neurological:     General: No focal deficit present.     Mental Status: He is alert and oriented to person, place, and time.  Psychiatric:  Mood and Affect: Mood normal.        Thought Content: Thought content normal.            Assessment and Plan: There are no diagnoses linked to this encounter.  Philip Chavez is a 70 y.o. male    1.  We will proceed to the OR for a laparoscopic right inguinal hernia repair with mesh. 2. All risks and benefits were discussed with the patient, to generally include infection, bleeding, damage to surrounding structures, acute and chronic nerve pain, and recurrence. Alternatives were offered and described.  All questions were answered and the patient voiced understanding of the procedure and wishes to proceed at this point.             No follow-ups on file.   Ralene Ok, MD, [facility], [place] [facility]  & Minimally Invasive Surgery

## 2020-09-06 NOTE — H&P (Signed)
Chief Complaint: No chief complaint on file.       History of Present Illness: Philip Chavez is a 70 y.o. male who is seen today as an office consultation at the request of Dr. No ref. provider found for evaluation of No chief complaint on file.   Patient here for follow-up for right inguinal hernia. Patient since subsequent visit underwent lead replacement by his cardiologist. Patient has been cleared by cardiology hold his Eliquis for 2 days prior to surgery. Patient continues with right inguinal hernia discomfort.   Review of Systems: A complete review of systems was obtained from the patient.  I have reviewed this information and discussed as appropriate with the patient.  See HPI as well for other ROS.   Review of Systems  Constitutional: Negative for fever.  HENT: Negative for congestion.   Eyes: Negative for blurred vision.  Respiratory: Negative for cough, shortness of breath and wheezing.   Cardiovascular: Negative for chest pain and palpitations.  Gastrointestinal: Negative for heartburn.  Genitourinary: Negative for dysuria.  Musculoskeletal: Negative for myalgias.  Skin: Negative for rash.  Neurological: Negative for dizziness and headaches.  Psychiatric/Behavioral: Negative for depression and suicidal ideas.  All other systems reviewed and are negative.       Medical History: Past Medical History No past medical history on file.    There is no problem list on file for this patient.     Past Surgical History No past surgical history on file.     Allergies Not on File    No current outpatient medications on file prior to visit.   No current facility-administered medications on file prior to visit.     Family History No family history on file.     Social History   Tobacco Use Smoking Status Not on file Smokeless Tobacco Not on file     Social History Social History    Socioeconomic History  Marital status: Unknown      Objective:      There were no vitals filed for this visit.  There is no height or weight on file to calculate BMI.   Physical Exam Constitutional:      Appearance: Normal appearance.  HENT:     Head: Normocephalic and atraumatic.     Nose: Nose normal. No congestion.     Mouth/Throat:     Mouth: Mucous membranes are moist.     Pharynx: Oropharynx is clear.  Eyes:     Pupils: Pupils are equal, round, and reactive to light.  Cardiovascular:     Rate and Rhythm: Normal rate and regular rhythm.     Pulses: Normal pulses.     Heart sounds: Normal heart sounds. No murmur heard.   No friction rub. No gallop.  Pulmonary:     Effort: Pulmonary effort is normal. No respiratory distress.     Breath sounds: Normal breath sounds. No stridor. No wheezing, rhonchi or rales.  Abdominal:     General: Abdomen is flat.     Hernia: A hernia is present. Hernia is present in the right inguinal area.  Musculoskeletal:        General: Normal range of motion.     Cervical back: Normal range of motion.  Skin:    General: Skin is warm and dry.  Neurological:     General: No focal deficit present.     Mental Status: He is alert and oriented to person, place, and time.  Psychiatric:  Mood and Affect: Mood normal.        Thought Content: Thought content normal.            Assessment and Plan: There are no diagnoses linked to this encounter.  Philip Chavez is a 70 y.o. male    1.  We will proceed to the OR for a laparoscopic right inguinal hernia repair with mesh. 2. All risks and benefits were discussed with the patient, to generally include infection, bleeding, damage to surrounding structures, acute and chronic nerve pain, and recurrence. Alternatives were offered and described.  All questions were answered and the patient voiced understanding of the procedure and wishes to proceed at this point.             No follow-ups on file.   Ralene Ok, MD, [facility], [place] [facility] Minimally Invasive Surgery

## 2020-09-07 NOTE — Progress Notes (Signed)
Remote ICD transmission.   

## 2020-09-13 ENCOUNTER — Other Ambulatory Visit (HOSPITAL_COMMUNITY): Payer: Self-pay

## 2020-09-15 ENCOUNTER — Encounter: Payer: Self-pay | Admitting: Internal Medicine

## 2020-09-15 NOTE — Progress Notes (Signed)
[facility] DEVICE PROGRAMMING  Patient Information: Name:  Stevin Piecuch  DOB:  02/08/1950  MRN:  [id number]    Planned Procedure: Right Inguinal Hernia Repair  Surgeon:  Dr. Ralene Ok  Date of Procedure:  09/22/20  Cautery will be used.  Position during surgery:  Supine   Please send documentation back to:  [facility] (Fax # [phone])  Device Information:  Clinic EP Physician:  [name], MD   Device Type:  Defibrillator Manufacturer and Phone #:  [employer]tronic: [id number] Pacemaker Dependent?:  Yes.   Date of Last Device Check:  08/21/20  Normal Device Function?:  Yes.  Known LV lead failure, no changes recommended at last OV on 05/10/20.   Electrophysiologist's Recommendations:  Have magnet available. Provide continuous ECG monitoring when magnet is used or reprogramming is to be performed.  Procedure may interfere with device function.  Magnet should be placed over device during procedure.  Per Device Clinic Standing Orders, Simone Curia, RN  1:34 PM 09/15/2020

## 2020-09-15 NOTE — Pre-Procedure Instructions (Signed)
Surgical Instructions    Your procedure is scheduled on 09/22/20.  Report to [facility] Main Entrance "A" at 05:30 A.M., then check in with the Admitting office.  Call this number if you have problems the morning of surgery:  [phone]   If you have any questions prior to your surgery date call [phone]: Open Monday-Friday 8am-4pm    Remember:  Do not eat after midnight the night before your surgery  You may drink clear liquids until 04:30 the morning of your surgery.   Clear liquids allowed are: Water, Non-Citrus Juices (without pulp), Carbonated Beverages, Clear Tea, Black Coffee Only, and Gatorade  Please complete your PRE-SURGERY ENSURE that was provided to you by 04:30 the morning of surgery.  Please, if able, drink it in one setting. DO NOT SIP.     Take these medicines the morning of surgery with A SIP OF WATER  bisoprolol (ZEBETA) digoxin (LANOXIN) dofetilide (TIKOSYN)  hydrALAZINE (APRESOLINE)  isosorbide mononitrate (IMDUR) simvastatin (ZOCOR  Please follow your surgeon's instructions for apixaban (ELIQUIS). If you have not received instructions, please contact your surgeon's office for blood thinner instructions.   As of today, STOP taking any Aspirin (unless otherwise instructed by your surgeon) Aleve, Naproxen, Ibuprofen, Motrin, Advil, Goody's, BC's, all herbal medications, fish oil, and all vitamins.  WHAT DO I DO ABOUT MY DIABETES MEDICATION? Do NOT take oral diabetes medicines (pills) the morning of surgery. Do NOT take dapagliflozin propanediol (FARXIGA) the day prior to surgery.  HOW TO MANAGE YOUR DIABETES BEFORE AND AFTER SURGERY  Why is it important to control my blood sugar before and after surgery? Improving blood sugar levels before and after surgery helps healing and can limit problems. A way of improving blood sugar control is eating a healthy diet by:  Eating less sugar and carbohydrates  Increasing activity/exercise  Talking with your  doctor about reaching your blood sugar goals High blood sugars (greater than 180 mg/dL) can raise your risk of infections and slow your recovery, so you will need to focus on controlling your diabetes during the weeks before surgery. Make sure that the doctor who takes care of your diabetes knows about your planned surgery including the date and location.  How do I manage my blood sugar before surgery? Check your blood sugar at least 4 times a day, starting 2 days before surgery, to make sure that the level is not too high or low.  Check your blood sugar the morning of your surgery when you wake up and every 2 hours until you get to the Short Stay unit.  If your blood sugar is less than 70 mg/dL, you will need to treat for low blood sugar: Treat a low blood sugar (less than 70 mg/dL) with  cup of clear juice (cranberry or apple), 4 glucose tablets, OR glucose gel. Recheck blood sugar in 15 minutes after treatment (to make sure it is greater than 70 mg/dL). If your blood sugar is not greater than 70 mg/dL on recheck, call [phone] for further instructions. Report your blood sugar to the short stay nurse when you get to Short Stay.  If you are admitted to the hospital after surgery: Your blood sugar will be checked by the staff and you will probably be given insulin after surgery (instead of oral diabetes medicines) to make sure you have good blood sugar levels. The goal for blood sugar control after surgery is 80-180 mg/dL.          Do not wear jewelry  or makeup Do not wear lotions, powders, perfumes/colognes, or deodorant. Do not shave 48 hours prior to surgery.  Men may shave face and neck. Do not bring valuables to the hospital. DO Not wear nail polish, gel polish, artificial nails, or any other type of covering on  natural nails including finger and toenails. If patients have artificial nails, gel coating, etc. that need to be removed by a nail salon please have this removed prior to  surgery or surgery may need to be canceled/delayed if the surgeon/ anesthesia feels like the patient is unable to be adequately monitored.             [facility] is not responsible for any belongings or valuables.  Do NOT Smoke (Tobacco/Vaping) or drink Alcohol 24 hours prior to your procedure If you use a CPAP at night, you may bring all equipment for your overnight stay.   Contacts, glasses, dentures or bridgework may not be worn into surgery, please bring cases for these belongings   For patients admitted to the hospital, discharge time will be determined by your treatment team.   Patients discharged the day of surgery will not be allowed to drive home, and someone needs to stay with them for 24 hours.  ONLY 1 SUPPORT PERSON MAY BE PRESENT WHILE YOU ARE IN SURGERY. IF YOU ARE TO BE ADMITTED ONCE YOU ARE IN YOUR ROOM YOU WILL BE ALLOWED TWO (2) VISITORS.  Minor children may have two parents present. Special consideration for safety and communication needs will be reviewed on a case by case basis.  Special instructions:    Oral Hygiene is also important to reduce your risk of infection.  Remember - BRUSH YOUR TEETH THE MORNING OF SURGERY WITH YOUR REGULAR TOOTHPASTE   [facility]- Preparing For Surgery  Before surgery, you can play an important role. Because skin is not sterile, your skin needs to be as free of germs as possible. You can reduce the number of germs on your skin by washing with CHG (chlorahexidine gluconate) Soap before surgery.  CHG is an antiseptic cleaner which kills germs and bonds with the skin to continue killing germs even after washing.     Please do not use if you have an allergy to CHG or antibacterial soaps. If your skin becomes reddened/irritated stop using the CHG.  Do not shave (including legs and underarms) for at least 48 hours prior to first CHG shower. It is OK to shave your face.  Please follow these instructions carefully.     Shower the NIGHT  BEFORE SURGERY and the MORNING OF SURGERY with CHG Soap.   If you chose to wash your hair, wash your hair first as usual with your normal shampoo. After you shampoo, rinse your hair and body thoroughly to remove the shampoo.  Then [employer] and genitals (private parts) with your normal soap and rinse thoroughly to remove soap.  After that Use CHG Soap as you would any other liquid soap. You can apply CHG directly to the skin and wash gently with a scrungie or a clean washcloth.   Apply the CHG Soap to your body ONLY FROM THE NECK DOWN.  Do not use on open wounds or open sores. Avoid contact with your eyes, ears, mouth and genitals (private parts). Wash Face and genitals (private parts)  with your normal soap.   Wash thoroughly, paying special attention to the area where your surgery will be performed.  Thoroughly rinse your body with warm water  from the neck down.  DO NOT shower/wash with your normal soap after using and rinsing off the CHG Soap.  Pat yourself dry with a CLEAN TOWEL.  Wear CLEAN PAJAMAS to bed the night before surgery  Place CLEAN SHEETS on your bed the night before your surgery  DO NOT SLEEP WITH PETS.   Day of Surgery:  Take a shower with CHG soap. Wear Clean/Comfortable clothing the morning of surgery Do not apply any deodorants/lotions.   Remember to brush your teeth WITH YOUR REGULAR TOOTHPASTE.   Please read over the following fact sheets that you were given.

## 2020-09-15 NOTE — Pre-Procedure Instructions (Signed)
Duplicate

## 2020-09-16 ENCOUNTER — Other Ambulatory Visit (HOSPITAL_COMMUNITY): Payer: Self-pay

## 2020-09-16 ENCOUNTER — Other Ambulatory Visit: Payer: Self-pay | Admitting: Urology

## 2020-09-18 ENCOUNTER — Other Ambulatory Visit: Payer: Self-pay

## 2020-09-18 ENCOUNTER — Encounter (HOSPITAL_COMMUNITY): Payer: Self-pay

## 2020-09-18 ENCOUNTER — Encounter (HOSPITAL_COMMUNITY)
Admission: RE | Admit: 2020-09-18 | Discharge: 2020-09-18 | Disposition: A | Discharge status: Home / Self Care | Payer: Medicare PPO | Source: Ambulatory Visit | Attending: General Surgery | Admitting: General Surgery

## 2020-09-18 ENCOUNTER — Other Ambulatory Visit: Payer: Self-pay | Admitting: Internal Medicine

## 2020-09-18 ENCOUNTER — Other Ambulatory Visit (HOSPITAL_COMMUNITY): Payer: Self-pay

## 2020-09-18 DIAGNOSIS — Z01812 Encounter for preprocedural laboratory examination: Secondary | ICD-10-CM | POA: Insufficient documentation

## 2020-09-18 LAB — BASIC METABOLIC PANEL
Anion gap: 9 (ref 5–15)
BUN: 22 mg/dL (ref 8–23)
CO2: 24 mmol/L (ref 22–32)
Calcium: 9.3 mg/dL (ref 8.9–10.3)
Chloride: 104 mmol/L (ref 98–111)
Creatinine, Ser: 1.71 mg/dL — ABNORMAL HIGH (ref 0.61–1.24)
GFR, Estimated: 43 mL/min — ABNORMAL LOW (ref >60)
Glucose, Bld: 95 mg/dL (ref 70–99)
Potassium: 4.3 mmol/L (ref 3.5–5.1)
Sodium: 137 mmol/L (ref 135–145)

## 2020-09-18 LAB — CBC
HCT: 43.1 % (ref 39.0–52.0)
Hemoglobin: 14 g/dL (ref 13.0–17.0)
MCH: 31 pg (ref 26.0–34.0)
MCHC: 32.5 g/dL (ref 30.0–36.0)
MCV: 95.6 fL (ref 80.0–100.0)
Platelets: 209 10*3/uL (ref 150–400)
RBC: 4.51 MIL/uL (ref 4.22–5.81)
RDW: 14.2 % (ref 11.5–15.5)
WBC: 5 10*3/uL (ref 4.0–10.5)
nRBC: 0 % (ref 0.0–0.2)

## 2020-09-18 NOTE — Progress Notes (Addendum)
PCP - Dr. Zettie [facility] Cardiologist - Dr. Crissie Sickles and Dr. Marigene Ehlers  PPM/ICD - Yes orders received and in chart Device Orders - Received Rep Notified - Yes   Chest x-ray - Not indicated EKG - 08/29/20 Stress Test - Year or so ago ECHO - 02/02/20 Cardiac Cath - 2016  Sleep Study - No OSA  DM - Denies  Blood Thinner Instructions: Eliquis instructed to stop 2 days prior to surgery 09/20/20  ERAS Protcol -Yes PRE-SURGERY Ensure    Anesthesia review: Yes cardiac history pacer  Patient denies shortness of breath, fever, cough and chest pain at PAT appointment   All instructions explained to the patient, with a verbal understanding of the material. Patient agrees to go over the instructions while at home for a better understanding.  The opportunity to ask questions was provided.

## 2020-09-19 NOTE — Progress Notes (Signed)
Anesthesia Chart Review:  Follows with cardiology for history of complete heart block/Medtronic PPM, chronic systolic CHF, atrial flutter s/p ablation, atrial fibrillation, DVT/PE.  He is on Eliquis for a CHADS2VASC score of 2.  He is status post recent dofetilide loading 6/20-6/23/22 with DCCV on 07/26/20.  Last seen by Malka So, PA in the A. fib clinic and was reportedly doing very well after cardioversion.  Last seen by Dr. Lovena Le in the [place] clinic 08/29/2020, doing well at that time, maintaining NSR.  Cleared for surgery per telephone encounter 08/18/2020, "Chart reviewed as part of pre-operative protocol coverage. Patient was contacted 08/18/2020 in reference to pre-operative risk assessment for pending surgery as outlined below.  Philip Chavez was last seen on 08/03/20 by Adline Peals, PA-C.  Since that day, Philip Chavez has done fine. He can complete 4 METs without anginal complaints. Therefore, based on ACC/AHA guidelines, the patient would be at acceptable risk for the planned procedure without further cardiovascular testing.  The patient was advised that if he develops new symptoms prior to surgery to contact our office to arrange for a follow-up visit, and he verbalized understanding. Per pharmacy recommendations, patient can hold eliquis 2 days prior to his upcoming procedure with plans to restart when cleared to do so by his surgeon. Given recent DCCV 07/26/20, patient's procedure should not be scheduled until on, or after, 08/25/20 to allow 4 weeks of uninterrupted anticoagulation following DCCV."  Patient reports last dose Eliquis 09/20/2020.  Preop labs reviewed, creatinine mildly elevated 1.71 (history of CKD 3, however recent creatinines appear to be around 1.3), otherwise unremarkable.  Will repeat BMP day of surgery to follow trend.  EKG 08/29/2020: Ventricular pacing.  Rate 66.  Perioperative prescription for implanted cardiac device programming per note 09/15/2020: Device Information:    Clinic EP Physician:  [name], MD    Device Type:  Defibrillator Manufacturer and Phone #:  Medtronic: [id number] Pacemaker Dependent?:  Yes.   Date of Last Device Check:  08/21/20            Normal Device Function?:  Yes.  Known LV lead failure, no changes recommended at last OV on 05/10/20.    Electrophysiologist's Recommendations:   Have magnet available. Provide continuous ECG monitoring when magnet is used or reprogramming is to be performed.  Procedure may interfere with device function.  Magnet should be placed over device during procedure.  TTE 02/02/2020: 1. Left ventricular ejection fraction, by estimation, is <20%. The left  ventricle has severely decreased function. The left ventricle demonstrates  global hypokinesis. The left ventricular internal cavity size was  moderately dilated. There is mild left  ventricular hypertrophy. Left ventricular diastolic parameters are  consistent with Grade II diastolic dysfunction (pseudonormalization).   2. Right ventricular systolic function is mildly reduced. The right  ventricular size is normal. There is normal pulmonary artery systolic  pressure. The estimated right ventricular systolic pressure is 0000000 mmHg.   3. Left atrial size was moderately dilated.   4. The mitral valve is normal in structure. Trivial mitral valve  regurgitation. No evidence of mitral stenosis.   5. The aortic valve is tricuspid. Aortic valve regurgitation is trivial.  No aortic stenosis is present.   6. Aortic dilatation noted. There is mild dilatation of the aortic root,  measuring 41 mm.   7. The inferior vena cava is dilated in size with >50% respiratory  variability, suggesting right atrial pressure of 8 mmHg.   Cath 10/17/2014: 1. Near-normal left and right  heart filling pressures.  Cardiac output low but not markedly low.  2. Mild nonobstructive coronary disease => nonischemic cardiomyopathy.    Patient will need CRT upgrade.    Wynonia Musty [facility] Short Stay Center/Anesthesiology Phone [phone] 09/19/2020 2:38 PM

## 2020-09-19 NOTE — Anesthesia Preprocedure Evaluation (Addendum)
Anesthesia Evaluation  Patient identified by MRN, date of birth, ID band Patient awake    Reviewed: Allergy & Precautions, NPO status , Patient's Chart, lab work & pertinent test results  Airway Mallampati: II  TM Distance: >3 FB Neck ROM: Full    Dental no notable dental hx.    Pulmonary sleep apnea , former smoker,    Pulmonary exam normal breath sounds clear to auscultation       Cardiovascular Exercise Tolerance: Good METS: 5 - 7 Mets hypertension, +CHF  Normal cardiovascular exam+ dysrhythmias (h/o aflutter s/p cardioversion, now NSR) Atrial Fibrillation + pacemaker + Cardiac Defibrillator  Rhythm:Regular Rate:Normal  EF<20% on last echo. No Echo since cardioversion in June 2022. Is maintaining NSR s/p cardioversion on Eliquis.    Neuro/Psych negative neurological ROS     GI/Hepatic negative GI ROS, Neg liver ROS,   Endo/Other    Renal/GU Renal InsufficiencyRenal disease     Musculoskeletal negative musculoskeletal ROS (+)   Abdominal   Peds  Hematology negative hematology ROS (+)   Anesthesia Other Findings   Reproductive/Obstetrics negative OB ROS                           Anesthesia Physical Anesthesia Plan  ASA: 3  Anesthesia Plan: General   Post-op Pain Management:    Induction: Intravenous  PONV Risk Score and Plan: 2 and Treatment may vary due to age or medical condition, Ondansetron and Dexamethasone  Airway Management Planned: Oral ETT  Additional Equipment: Arterial line  Intra-op Plan:   Post-operative Plan: Extubation in OR  Informed Consent: I have reviewed the patients History and Physical, chart, labs and discussed the procedure including the risks, benefits and alternatives for the proposed anesthesia with the patient or authorized representative who has indicated his/her understanding and acceptance.     Dental advisory given  Plan Discussed with:  CRNA and Anesthesiologist  Anesthesia Plan Comments: (Magnet for AICD. + arterial line given very low EF and no repeat ECHO since cardioversion. Norton Blizzard, MD       PAT note by Karoline Caldwell, PA-C: Follows with cardiology for history of complete heart block/Medtronic PPM, chronic systolic CHF, atrial flutter s/p ablation, atrial fibrillation, DVT/PE.  He is on Eliquis for a CHADS2VASC score of 2.  He is status post recent dofetilide loading 6/20-6/23/22 with DCCV on 07/26/20.  Last seen by Malka So, PA in the A. fib clinic and was reportedly doing very well after cardioversion.  Last seen by Dr. Lovena Le in the [place] clinic 08/29/2020, doing well at that time, maintaining NSR.  Cleared for surgery per telephone encounter 08/18/2020, "Chart reviewed as part of pre-operative protocol coverage. Patient was contacted7/15/2022in reference to pre-operative risk assessment for pending surgery as outlined below. Kerry Hough last seen on 6/30/22by Adline Peals, PA-C. Since that day, Piper Desalvo done fine. He can complete 4 METs without anginal complaints. Therefore, based on ACC/AHA guidelines, the patient would be at acceptable risk for the planned procedure without further cardiovascular testing.  The patient was advised that ifhedevelops new symptoms prior to surgery to contact our office to arrange for a follow-up visit, and heverbalized understanding. Per pharmacy recommendations, patient can hold eliquis 2 days prior to his upcoming procedure with plans to restart when cleared to do so by his surgeon. Given recent DCCV 07/26/20, patient's procedure should not be scheduled until on, or after, 08/25/20 to allow 4 weeks of uninterrupted anticoagulation following DCCV."  Patient  reports last dose Eliquis 09/20/2020.  Preop labs reviewed, creatinine mildly elevated 1.71 (history of CKD 3, however recent creatinines appear to be around 1.3), otherwise unremarkable.  Will repeat BMP day of surgery to  follow trend.  EKG 08/29/2020: Ventricular pacing.  Rate 66.  Perioperative prescription for implanted cardiac device programming per note 09/15/2020: Device Information:  Clinic EP Physician:Gregg Lovena Le, MD  Device Type:Defibrillator Manufacturer and Phone #:Medtronic: [phone] Pacemaker Dependent?:Yes. Date of Last Device Check:08/21/20 Normal Device Function?:Yes.Known LV lead failure, no changes recommended at last OV on 05/10/20.  Electrophysiologist's Recommendations:  . Have magnet available. . Provide continuous ECG monitoring when magnet is used or reprogramming is to be performed. . Procedure may interfere with device function. Magnet should be placed over device during procedure.  TTE 02/02/2020: 1. Left ventricular ejection fraction, by estimation, is <20%. The left  ventricle has severely decreased function. The left ventricle demonstrates  global hypokinesis. The left ventricular internal cavity size was  moderately dilated. There is mild left  ventricular hypertrophy. Left ventricular diastolic parameters are  consistent with Grade II diastolic dysfunction (pseudonormalization).  2. Right ventricular systolic function is mildly reduced. The right  ventricular size is normal. There is normal pulmonary artery systolic  pressure. The estimated right ventricular systolic pressure is 0000000 mmHg.  3. Left atrial size was moderately dilated.  4. The mitral valve is normal in structure. Trivial mitral valve  regurgitation. No evidence of mitral stenosis.  5. The aortic valve is tricuspid. Aortic valve regurgitation is trivial.  No aortic stenosis is present.  6. Aortic dilatation noted. There is mild dilatation of the aortic root,  measuring 41 mm.  7. The inferior vena cava is dilated in size with >50% respiratory  variability, suggesting right atrial pressure of 8 mmHg.   Cath 10/17/2014: 1. Near-normal left and right heart  filling pressures. Cardiac output low but not markedly low.  2. Mild nonobstructive coronary disease =>nonischemic cardiomyopathy.   Patient will need CRT upgrade.  )      Anesthesia Quick Evaluation

## 2020-09-22 ENCOUNTER — Other Ambulatory Visit: Payer: Self-pay

## 2020-09-22 ENCOUNTER — Ambulatory Visit (HOSPITAL_COMMUNITY): Payer: Medicare PPO | Admitting: Certified Registered Nurse Anesthetist

## 2020-09-22 ENCOUNTER — Other Ambulatory Visit (HOSPITAL_COMMUNITY): Payer: Self-pay

## 2020-09-22 ENCOUNTER — Encounter (HOSPITAL_COMMUNITY): Payer: Self-pay | Admitting: General Surgery

## 2020-09-22 ENCOUNTER — Ambulatory Visit (HOSPITAL_COMMUNITY)
Admission: RE | Admit: 2020-09-22 | Discharge: 2020-09-22 | Disposition: A | Discharge status: Home / Self Care | Payer: Medicare PPO | Attending: General Surgery | Admitting: General Surgery

## 2020-09-22 ENCOUNTER — Ambulatory Visit (HOSPITAL_COMMUNITY): Payer: Medicare PPO | Admitting: Physician Assistant

## 2020-09-22 ENCOUNTER — Encounter (HOSPITAL_COMMUNITY)
Admission: RE | Disposition: A | Discharge status: Home / Self Care | Payer: Self-pay | Source: Home / Self Care | Attending: General Surgery

## 2020-09-22 DIAGNOSIS — I110 Hypertensive heart disease with heart failure: Secondary | ICD-10-CM | POA: Diagnosis not present

## 2020-09-22 DIAGNOSIS — Z95810 Presence of automatic (implantable) cardiac defibrillator: Secondary | ICD-10-CM | POA: Diagnosis not present

## 2020-09-22 DIAGNOSIS — I509 Heart failure, unspecified: Secondary | ICD-10-CM | POA: Insufficient documentation

## 2020-09-22 DIAGNOSIS — Z87891 Personal history of nicotine dependence: Secondary | ICD-10-CM | POA: Diagnosis not present

## 2020-09-22 DIAGNOSIS — K4090 Unilateral inguinal hernia, without obstruction or gangrene, not specified as recurrent: Secondary | ICD-10-CM | POA: Insufficient documentation

## 2020-09-22 DIAGNOSIS — I5042 Chronic combined systolic (congestive) and diastolic (congestive) heart failure: Secondary | ICD-10-CM | POA: Diagnosis not present

## 2020-09-22 DIAGNOSIS — N183 Chronic kidney disease, stage 3 unspecified: Secondary | ICD-10-CM | POA: Diagnosis not present

## 2020-09-22 DIAGNOSIS — I130 Hypertensive heart and chronic kidney disease with heart failure and stage 1 through stage 4 chronic kidney disease, or unspecified chronic kidney disease: Secondary | ICD-10-CM | POA: Diagnosis not present

## 2020-09-22 HISTORY — PX: INGUINAL HERNIA REPAIR: SHX194

## 2020-09-22 LAB — BASIC METABOLIC PANEL
Anion gap: 6 (ref 5–15)
BUN: 16 mg/dL (ref 8–23)
CO2: 27 mmol/L (ref 22–32)
Calcium: 9.1 mg/dL (ref 8.9–10.3)
Chloride: 103 mmol/L (ref 98–111)
Creatinine, Ser: 1.38 mg/dL — ABNORMAL HIGH (ref 0.61–1.24)
GFR, Estimated: 55 mL/min — ABNORMAL LOW (ref >60)
Glucose, Bld: 172 mg/dL — ABNORMAL HIGH (ref 70–99)
Potassium: 3.6 mmol/L (ref 3.5–5.1)
Sodium: 136 mmol/L (ref 135–145)

## 2020-09-22 SURGERY — REPAIR, HERNIA, INGUINAL, LAPAROSCOPIC
Anesthesia: General | Laterality: Right

## 2020-09-22 MED ORDER — BUPIVACAINE HCL 0.25 % IJ SOLN
INTRAMUSCULAR | Status: DC | PRN
  Administered 2020-09-22: 7 mL

## 2020-09-22 MED ORDER — CHLORHEXIDINE GLUCONATE CLOTH 2 % EX PADS: 6.0000 | MEDICATED_PAD | Freq: Once | CUTANEOUS | Status: DC

## 2020-09-22 MED ORDER — CHLORHEXIDINE GLUCONATE 0.12 % MT SOLN
15.0000 mL | Freq: Once | OROMUCOSAL | Status: AC
  Administered 2020-09-22: 15 mL via OROMUCOSAL
  Filled 2020-09-22: qty 15

## 2020-09-22 MED ORDER — ROCURONIUM BROMIDE 10 MG/ML (PF) SYRINGE
PREFILLED_SYRINGE | INTRAVENOUS | Status: DC | PRN
  Administered 2020-09-22: 60 mg via INTRAVENOUS

## 2020-09-22 MED ORDER — LIDOCAINE 2% (20 MG/ML) 5 ML SYRINGE
INTRAMUSCULAR | Status: AC
  Filled 2020-09-22: qty 5

## 2020-09-22 MED ORDER — PROPOFOL 10 MG/ML IV BOLUS
INTRAVENOUS | Status: AC
  Filled 2020-09-22: qty 20

## 2020-09-22 MED ORDER — ACETAMINOPHEN 500 MG PO TABS: 1000.0000 mg | ORAL_TABLET | Freq: Once | ORAL | Status: DC

## 2020-09-22 MED ORDER — ENSURE PRE-SURGERY PO LIQD: 296.0000 mL | Freq: Once | ORAL | Status: DC

## 2020-09-22 MED ORDER — ACETAMINOPHEN 500 MG PO TABS
1000.0000 mg | ORAL_TABLET | ORAL | Status: AC
  Administered 2020-09-22: 1000 mg via ORAL
  Filled 2020-09-22: qty 2

## 2020-09-22 MED ORDER — PHENYLEPHRINE 40 MCG/ML (10ML) SYRINGE FOR IV PUSH (FOR BLOOD PRESSURE SUPPORT)
PREFILLED_SYRINGE | INTRAVENOUS | Status: AC
  Filled 2020-09-22: qty 10

## 2020-09-22 MED ORDER — CEFAZOLIN SODIUM-DEXTROSE 2-4 GM/100ML-% IV SOLN
2.0000 g | INTRAVENOUS | Status: AC
  Administered 2020-09-22: 2 g via INTRAVENOUS
  Filled 2020-09-22: qty 100

## 2020-09-22 MED ORDER — ORAL CARE MOUTH RINSE: 15.0000 mL | Freq: Once | OROMUCOSAL | Status: AC

## 2020-09-22 MED ORDER — ONDANSETRON HCL 4 MG/2ML IJ SOLN
INTRAMUSCULAR | Status: AC
  Filled 2020-09-22: qty 2

## 2020-09-22 MED ORDER — FENTANYL CITRATE (PF) 250 MCG/5ML IJ SOLN
INTRAMUSCULAR | Status: DC | PRN
  Administered 2020-09-22 (×3): 50 ug via INTRAVENOUS

## 2020-09-22 MED ORDER — BUPIVACAINE HCL (PF) 0.25 % IJ SOLN
INTRAMUSCULAR | Status: AC
  Filled 2020-09-22: qty 30

## 2020-09-22 MED ORDER — FENTANYL CITRATE (PF) 250 MCG/5ML IJ SOLN
INTRAMUSCULAR | Status: AC
  Filled 2020-09-22: qty 5

## 2020-09-22 MED ORDER — DEXAMETHASONE SODIUM PHOSPHATE 10 MG/ML IJ SOLN
INTRAMUSCULAR | Status: DC | PRN
  Administered 2020-09-22: 10 mg via INTRAVENOUS

## 2020-09-22 MED ORDER — MIDAZOLAM HCL 2 MG/2ML IJ SOLN
INTRAMUSCULAR | Status: AC
  Filled 2020-09-22: qty 2

## 2020-09-22 MED ORDER — PROPOFOL 10 MG/ML IV BOLUS
INTRAVENOUS | Status: DC | PRN
  Administered 2020-09-22: 120 mg via INTRAVENOUS

## 2020-09-22 MED ORDER — MIDAZOLAM HCL 2 MG/2ML IJ SOLN
INTRAMUSCULAR | Status: DC | PRN
  Administered 2020-09-22: 2 mg via INTRAVENOUS

## 2020-09-22 MED ORDER — ROCURONIUM BROMIDE 10 MG/ML (PF) SYRINGE
PREFILLED_SYRINGE | INTRAVENOUS | Status: AC
  Filled 2020-09-22: qty 10

## 2020-09-22 MED ORDER — FENTANYL CITRATE (PF) 100 MCG/2ML IJ SOLN: 25.0000 ug | INTRAMUSCULAR | Status: DC | PRN

## 2020-09-22 MED ORDER — PROMETHAZINE HCL 25 MG/ML IJ SOLN: 6.2500 mg | INTRAMUSCULAR | Status: DC | PRN

## 2020-09-22 MED ORDER — PHENYLEPHRINE HCL-NACL 20-0.9 MG/250ML-% IV SOLN
INTRAVENOUS | Status: DC | PRN
  Administered 2020-09-22: 20 ug/min via INTRAVENOUS

## 2020-09-22 MED ORDER — SUGAMMADEX SODIUM 200 MG/2ML IV SOLN
INTRAVENOUS | Status: DC | PRN
  Administered 2020-09-22 (×2): 100 mg via INTRAVENOUS

## 2020-09-22 MED ORDER — ONDANSETRON HCL 4 MG/2ML IJ SOLN
INTRAMUSCULAR | Status: DC | PRN
  Administered 2020-09-22: 4 mg via INTRAVENOUS

## 2020-09-22 MED ORDER — TRAMADOL HCL 50 MG PO TABS: 50.0000 mg | ORAL_TABLET | Freq: Four times a day (QID) | ORAL | 0 refills | Status: AC | PRN

## 2020-09-22 MED ORDER — DEXAMETHASONE SODIUM PHOSPHATE 10 MG/ML IJ SOLN
INTRAMUSCULAR | Status: AC
  Filled 2020-09-22: qty 1

## 2020-09-22 MED ORDER — LACTATED RINGERS IV SOLN: INTRAVENOUS | Status: DC

## 2020-09-22 MED ORDER — PHENYLEPHRINE 40 MCG/ML (10ML) SYRINGE FOR IV PUSH (FOR BLOOD PRESSURE SUPPORT)
PREFILLED_SYRINGE | INTRAVENOUS | Status: DC | PRN
  Administered 2020-09-22 (×2): 80 ug via INTRAVENOUS

## 2020-09-22 MED ORDER — VASOPRESSIN 20 UNIT/ML IV SOLN
INTRAVENOUS | Status: AC
  Filled 2020-09-22: qty 1

## 2020-09-22 MED ORDER — LIDOCAINE 2% (20 MG/ML) 5 ML SYRINGE
INTRAMUSCULAR | Status: DC | PRN
  Administered 2020-09-22: 80 mg via INTRAVENOUS

## 2020-09-22 MED ORDER — OXYCODONE HCL 5 MG PO TABS: 5.0000 mg | ORAL_TABLET | Freq: Once | ORAL | Status: DC | PRN

## 2020-09-22 MED ORDER — OXYCODONE HCL 5 MG/5ML PO SOLN: 5.0000 mg | Freq: Once | ORAL | Status: DC | PRN

## 2020-09-22 MED ORDER — 0.9 % SODIUM CHLORIDE (POUR BTL) OPTIME
TOPICAL | Status: DC | PRN
  Administered 2020-09-22: 1000 mL

## 2020-09-22 SURGICAL SUPPLY — 43 items
ADH SKN CLS APL DERMABOND .7 (GAUZE/BANDAGES/DRESSINGS) ×1
BAG COUNTER SPONGE SURGICOUNT (BAG) ×2 IMPLANT
BAG SPNG CNTER NS LX DISP (BAG) ×1
CANISTER SUCT 3000ML PPV (MISCELLANEOUS) IMPLANT
COVER SURGICAL LIGHT HANDLE (MISCELLANEOUS) ×2 IMPLANT
DERMABOND ADVANCED (GAUZE/BANDAGES/DRESSINGS) ×1
DERMABOND ADVANCED .7 DNX12 (GAUZE/BANDAGES/DRESSINGS) ×1 IMPLANT
DISSECTOR BLUNT TIP ENDO 5MM (MISCELLANEOUS) IMPLANT
ELECT REM PT RETURN 9FT ADLT (ELECTROSURGICAL) ×2
ELECTRODE REM PT RTRN 9FT ADLT (ELECTROSURGICAL) ×1 IMPLANT
GLOVE SURG ENC MOIS LTX SZ7.5 (GLOVE) ×2 IMPLANT
GOWN STRL REUS W/ TWL LRG LVL3 (GOWN DISPOSABLE) ×2 IMPLANT
GOWN STRL REUS W/ TWL XL LVL3 (GOWN DISPOSABLE) ×1 IMPLANT
GOWN STRL REUS W/TWL LRG LVL3 (GOWN DISPOSABLE) ×4
GOWN STRL REUS W/TWL XL LVL3 (GOWN DISPOSABLE) ×2
KIT BASIN OR (CUSTOM PROCEDURE TRAY) ×2 IMPLANT
KIT TURNOVER KIT B (KITS) ×2 IMPLANT
MESH 3DMAX 5X7 RT XLRG (Mesh General) ×1 IMPLANT
NDL INSUFFLATION 14GA 120MM (NEEDLE) IMPLANT
NEEDLE INSUFFLATION 14GA 120MM (NEEDLE) IMPLANT
NS IRRIG 1000ML POUR BTL (IV SOLUTION) ×2 IMPLANT
PAD ARMBOARD 7.5X6 YLW CONV (MISCELLANEOUS) ×4 IMPLANT
RELOAD STAPLE 4.0 BLU F/HERNIA (INSTRUMENTS) IMPLANT
RELOAD STAPLE 4.8 BLK F/HERNIA (STAPLE) IMPLANT
RELOAD STAPLE HERNIA 4.0 BLUE (INSTRUMENTS) ×2 IMPLANT
RELOAD STAPLE HERNIA 4.8 BLK (STAPLE) IMPLANT
SCISSORS LAP 5X35 DISP (ENDOMECHANICALS) ×2 IMPLANT
SET IRRIG TUBING LAPAROSCOPIC (IRRIGATION / IRRIGATOR) IMPLANT
SET TUBE SMOKE EVAC HIGH FLOW (TUBING) ×2 IMPLANT
STAPLER HERNIA 12 8.5 360D (INSTRUMENTS) IMPLANT
SUT MNCRL AB 4-0 PS2 18 (SUTURE) ×2 IMPLANT
SUT VIC AB 1 CT1 27 (SUTURE)
SUT VIC AB 1 CT1 27XBRD ANBCTR (SUTURE) IMPLANT
SYRINGE TOOMEY DISP (SYRINGE) ×2 IMPLANT
TOWEL GREEN STERILE (TOWEL DISPOSABLE) ×2 IMPLANT
TOWEL GREEN STERILE FF (TOWEL DISPOSABLE) ×2 IMPLANT
TRAY FOLEY W/BAG SLVR 14FR (SET/KITS/TRAYS/PACK) ×1 IMPLANT
TRAY LAPAROSCOPIC MC (CUSTOM PROCEDURE TRAY) ×2 IMPLANT
TROCAR OPTICAL SHORT 5MM (TROCAR) ×2 IMPLANT
TROCAR OPTICAL SLV SHORT 5MM (TROCAR) ×2 IMPLANT
TROCAR XCEL 12X100 BLDLESS (ENDOMECHANICALS) ×2 IMPLANT
WARMER LAPAROSCOPE (MISCELLANEOUS) ×2 IMPLANT
WATER STERILE IRR 1000ML POUR (IV SOLUTION) ×1 IMPLANT

## 2020-09-22 NOTE — Interval H&P Note (Signed)
History and Physical Interval Note:  09/22/2020 7:00 AM  Philip Chavez  has presented today for surgery, with the diagnosis of right inguinal hernia.  The various methods of treatment have been discussed with the patient and family. After consideration of risks, benefits and other options for treatment, the patient has consented to  Procedure(s): LAPAROSCOPIC RIGHT INGUINAL HERNIA REPAIR WITH MESH (Right) as a surgical intervention.  The patient's history has been reviewed, patient examined, no change in status, stable for surgery.  I have reviewed the patient's chart and labs.  Questions were answered to the patient's satisfaction.     Ralene Ok

## 2020-09-22 NOTE — Anesthesia Procedure Notes (Signed)
Procedure Name: Intubation Date/Time: 09/22/2020 7:33 AM Performed by: Betha Loa, CRNA Pre-anesthesia Checklist: Patient identified, Emergency Drugs available, Suction available and Patient being monitored Patient Re-evaluated:Patient Re-evaluated prior to induction Oxygen Delivery Method: Circle System Utilized Preoxygenation: Pre-oxygenation with 100% oxygen Induction Type: IV induction Ventilation: Mask ventilation without difficulty Laryngoscope Size: Mac and 4 Grade View: Grade II Tube type: Oral Tube size: 7.5 mm Number of attempts: 1 Airway Equipment and Method: Stylet and Oral airway Placement Confirmation: ETT inserted through vocal cords under direct vision, positive ETCO2 and breath sounds checked- equal and bilateral Secured at: 23 cm Tube secured with: Tape Dental Injury: Teeth and Oropharynx as per pre-operative assessment

## 2020-09-22 NOTE — Op Note (Signed)
09/22/2020  8:18 AM  PATIENT:  Philip Chavez  70 y.o. male  PRE-OPERATIVE DIAGNOSIS:  right inguinal hernia  POST-OPERATIVE DIAGNOSIS:  right INDIRECT inguinal hernia  PROCEDURE:  Procedure(s): LAPAROSCOPIC RIGHT INGUINAL HERNIA REPAIR WITH MESH (Right)  SURGEON:  Surgeon(s) and Role:    Ralene Ok, MD - Primary  ASSISTANTS: Ewell Poe, RNFA   ANESTHESIA:   local and general  EBL:  10 mL   BLOOD ADMINISTERED:none  DRAINS: none   LOCAL MEDICATIONS USED:  BUPIVICAINE   SPECIMEN:  No Specimen  DISPOSITION OF SPECIMEN:  N/A  COUNTS:  YES  TOURNIQUET:  * No tourniquets in log *  DICTATION: .Dragon Dictation  Counts: reported as correct x 2  Findings:  The patient had a large, right indirect hernia and a moderated sized cord lipoma  Indications for procedure:  The patient is a 70 year old male with a right inguinal hernia for several months. Patient complained of symptomatology to his right inguinal area. The patient was taken back for elective inguinal hernia repair.  Details of the procedure: The patient was taken back to the operating room. The patient was placed in supine position with bilateral SCDs in place.  The patient was prepped and draped in the usual sterile fashion.  After appropriate anitbiotics were confirmed, a time-out was confirmed and all facts were verified.  0.25% Marcaine was used to infiltrate the umbilical area. A 11-blade was used to cut down the skin and blunt dissection was used to get the anterior fashion.  The anterior fascia was incised approximately 1 cm and the muscles were retracted laterally. Blunt dissection was then used to create a space in the preperitoneal area. At this time a 10 mm camera was then introduced into the space and advanced the pubic tubercle and a 12 mm trocar was placed over this and insufflation was started.  At this time and space was created from medial to laterally the preperitoneal space.  Cooper's ligament  was initially cleaned off.  The hernia sac was identified in the indirect space. Dissection of the hernia sac and cord structures was undertaken the vas deferens was identified and protected in all parts of the case.    Once the hernia sac was taken down to approximately the umbilicus a Bard 3D Max mesh, size: Rachelle Hora, was  introduced into the preperitoneal space.  The mesh was brought over to cover the direct and indirect hernia spaces.  This was anchored into place and secured to Cooper's ligament with 4.3m staples from a [place] hernia stapler. It was anchored to the anterior abdominal wall with 4.8 mm staples. The hernia sac was seen lying posterior to the mesh. There was no staples placed laterally. The insufflation was evacuated and the peritoneum was seen posterior to the mesh. The trochars were removed. The anterior fascia was reapproximated using #1 Vicryl on a UR- 6.  Intra-abdominal air was evacuated and the Veress needle removed. The skin was reapproximated using 4-0 Monocryl subcuticular fashion and Dermabond. The patient was awakened from general anesthesia and taken to recovery in stable condition.   PLAN OF CARE: Discharge to home after PACU  PATIENT DISPOSITION:  PACU - hemodynamically stable.   Delay start of Pharmacological VTE agent (>24hrs) due to surgical blood loss or risk of bleeding: not applicable

## 2020-09-22 NOTE — Anesthesia Postprocedure Evaluation (Signed)
Anesthesia Post Note  Patient: Micha Vanvliet  Procedure(s) Performed: LAPAROSCOPIC RIGHT INGUINAL HERNIA REPAIR WITH MESH (Right)     Patient location during evaluation: PACU Anesthesia Type: General Level of consciousness: sedated Pain management: pain level controlled Vital Signs Assessment: post-procedure vital signs reviewed and stable Respiratory status: spontaneous breathing and respiratory function stable Cardiovascular status: stable Postop Assessment: no apparent nausea or vomiting Anesthetic complications: no   No notable events documented.  Last Vitals:  Vitals:   09/22/20 0904 09/22/20 0912  BP: 106/74 104/67  Pulse: (!) 59 (!) 57  Resp: 14 15  Temp:  36.6 C  SpO2: 99% 99%    Last Pain:  Vitals:   09/22/20 0912  TempSrc:   PainSc: 0-No pain                 Merlinda Frederick

## 2020-09-22 NOTE — Discharge Instructions (Signed)
[facility] _______[facility] ? ?INGUINAL HERNIA REPAIR: POST OP INSTRUCTIONS ? ?Always review your discharge instruction sheet given to you by the facility where your surgery was performed. ?IF YOU HAVE DISABILITY OR FAMILY LEAVE FORMS, YOU MUST BRING THEM TO THE OFFICE FOR PROCESSING.   ?DO NOT GIVE THEM TO YOUR DOCTOR. ? ?1. A  prescription for pain medication may be given to you upon discharge.  Take your pain medication as prescribed, if needed.  If narcotic pain medicine is not needed, then you may take acetaminophen (Tylenol) or ibuprofen (Advil) as needed. ?2. Take your usually prescribed medications unless otherwise directed. ?If you need a refill on your pain medication, please contact your pharmacy.  They will contact our office to request authorization. Prescriptions will not be filled after 5 pm or on week-ends. ?3. You should follow a light diet the first 24 hours after arrival home, such as soup and crackers, etc.  Be sure to include lots of fluids daily.  Resume your normal diet the day after surgery. ?4.Most patients will experience some swelling and bruising around the umbilicus or in the groin and scrotum.  Ice packs and reclining will help.  Swelling and bruising can take several days to resolve.  ?6. It is common to experience some constipation if taking pain medication after surgery.  Increasing fluid intake and taking a stool softener (such as Colace) will usually help or prevent this problem from occurring.  A mild laxative (Milk of Magnesia or Miralax) should be taken according to package directions if there are no bowel movements after 48 hours. ?7. Unless discharge instructions indicate otherwise, you may remove your bandages 24-48 hours after surgery, and you may shower at that time.  You may have steri-strips (small skin tapes) in place directly over the incision.  These strips should be left on the skin for 7-10 days.  If your surgeon used skin glue on the incision, you may  shower in 24 hours.  The glue will flake off over the next 2-3 weeks.  Any sutures or staples will be removed at the office during your follow-up visit. ?8. ACTIVITIES:  You may resume regular (light) daily activities beginning the next day--such as daily self-care, walking, climbing stairs--gradually increasing activities as tolerated.  You may have sexual intercourse when it is comfortable.  Refrain from any heavy lifting or straining until approved by your doctor. ? ?a.You may drive when you are no longer taking prescription pain medication, you can comfortably wear a seatbelt, and you can safely maneuver your car and apply brakes. ?b.RETURN TO WORK:   ?_____________________________________________ ? ?9.You should see your doctor in the office for a follow-up appointment approximately 2-3 weeks after your surgery.  Make sure that you call for this appointment within a day or two after you arrive home to insure a convenient appointment time. ?10.OTHER INSTRUCTIONS: _________________________ ?   _____________________________________ ? ?WHEN TO CALL YOUR DOCTOR: ?Fever over 101.0 ?Inability to urinate ?Nausea and/or vomiting ?Extreme swelling or bruising ?Continued bleeding from incision. ?Increased pain, redness, or drainage from the incision ? ?The clinic staff is available to answer your questions during regular business hours.  Please don?t hesitate to call and ask to speak to one of the nurses for clinical concerns.  If you have a medical emergency, go to the nearest emergency room or call 911.  A surgeon from [facility] is always on call at the hospital ? ? ?[address], [address], [place], [place]    [address] ? ? [address] [address], [place], [place]   [address] ?[phone] ? [phone] ? FAX [phone] ?Web site: [email or web address] ? ?

## 2020-09-22 NOTE — Transfer of Care (Signed)
Immediate Anesthesia Transfer of Care Note  Patient: Philip Chavez  Procedure(s) Performed: LAPAROSCOPIC RIGHT INGUINAL HERNIA REPAIR WITH MESH (Right)  Patient Location: PACU  Anesthesia Type:General  Level of Consciousness: patient cooperative and responds to stimulation  Airway & Oxygen Therapy: Patient Spontanous Breathing and Patient connected to nasal cannula oxygen  Post-op Assessment: Report given to RN and Post -op Vital signs reviewed and stable  Post vital signs: Reviewed and stable  Last Vitals:  Vitals Value Taken Time  BP 111/76 09/22/20 0834  Temp    Pulse 61 09/22/20 0836  Resp 21 09/22/20 0836  SpO2 97 % 09/22/20 0836  Vitals shown include unvalidated device data.  Last Pain:  Vitals:   09/22/20 0633  TempSrc:   PainSc: 0-No pain         Complications: No notable events documented.

## 2020-09-25 ENCOUNTER — Encounter (HOSPITAL_COMMUNITY): Payer: Self-pay | Admitting: General Surgery

## 2020-09-25 ENCOUNTER — Ambulatory Visit (INDEPENDENT_AMBULATORY_CARE_PROVIDER_SITE_OTHER): Payer: Medicare PPO

## 2020-09-25 ENCOUNTER — Other Ambulatory Visit (HOSPITAL_COMMUNITY): Payer: Self-pay

## 2020-09-25 DIAGNOSIS — I5022 Chronic systolic (congestive) heart failure: Secondary | ICD-10-CM | POA: Diagnosis not present

## 2020-09-25 DIAGNOSIS — Z95810 Presence of automatic (implantable) cardiac defibrillator: Secondary | ICD-10-CM

## 2020-09-26 ENCOUNTER — Other Ambulatory Visit (HOSPITAL_COMMUNITY): Payer: Self-pay

## 2020-09-26 NOTE — Progress Notes (Signed)
EPIC Encounter for ICM Monitoring  Patient Name: Philip Chavez is a 70 y.o. male Date: 09/26/2020 Primary Care Physican: System, Provider Not In Primary Cardiologist: Aundra Dubin Electrophysiologist: Lovena Le RV Pacing:  99.4%        08/22/2020 Weight: 203-205 lbs   Since 21-Aug-2020 AT/AF 2 Time in AT/AF <0.1 hr/day (<0.1%) Longest AT/AF 36 minutes                    Spoke with patient and reports feeling well at this time. Heart failure questions reviewed. Pt asymptomatic for fluid accumulation.  He had hernia surgery on 8/19.   OptiVol Thoracic impedance suggesting normal fluid levels.     Prescribed: Furosemide 20 mg 1 tablet twice a day.   Labs: 05/03/2020 Creatinine 1.38, BUN 11, Potassium 3.8, Sodium 139, GFR 55 02/16/2020 Creatinine 1.33, BUN 15, Potassium 3.8, Sodium 138, GFR 58 A complete set of results can be found in Results Review.   Recommendations:  No changes and encouraged to call if experiencing any fluid symptoms.   Follow-up plan: ICM clinic phone appointment on 11/06/2020.   91 day device clinic remote transmission 11/15/2020.     EP/Cardiology Office Visits:  08/29/2020 with Dr Lovena Le.    Copy of ICM check sent to Dr. Lovena Le.    3 month ICM trend: 09/26/2020.    1 Year ICM trend:       Rosalene Billings, RN 09/26/2020 3:04 PM

## 2020-10-02 ENCOUNTER — Other Ambulatory Visit (HOSPITAL_COMMUNITY): Payer: Self-pay

## 2020-10-02 ENCOUNTER — Other Ambulatory Visit: Payer: Self-pay | Admitting: Cardiology

## 2020-10-04 ENCOUNTER — Other Ambulatory Visit (HOSPITAL_COMMUNITY): Payer: Self-pay

## 2020-10-04 MED ORDER — ENTRESTO 24-26 MG PO TABS
1.0000 | ORAL_TABLET | Freq: Two times a day (BID) | ORAL | 0 refills | Status: DC
  Filled 2020-10-04: qty 60, 30d supply, fill #0

## 2020-10-05 ENCOUNTER — Other Ambulatory Visit (HOSPITAL_COMMUNITY): Payer: Self-pay

## 2020-10-07 ENCOUNTER — Other Ambulatory Visit (HOSPITAL_COMMUNITY): Payer: Self-pay

## 2020-10-14 DIAGNOSIS — Z7901 Long term (current) use of anticoagulants: Secondary | ICD-10-CM | POA: Diagnosis not present

## 2020-10-14 DIAGNOSIS — D6869 Other thrombophilia: Secondary | ICD-10-CM | POA: Diagnosis not present

## 2020-10-14 DIAGNOSIS — I25119 Atherosclerotic heart disease of native coronary artery with unspecified angina pectoris: Secondary | ICD-10-CM | POA: Diagnosis not present

## 2020-10-14 DIAGNOSIS — I509 Heart failure, unspecified: Secondary | ICD-10-CM | POA: Diagnosis not present

## 2020-10-14 DIAGNOSIS — I110 Hypertensive heart disease with heart failure: Secondary | ICD-10-CM | POA: Diagnosis not present

## 2020-10-14 DIAGNOSIS — E785 Hyperlipidemia, unspecified: Secondary | ICD-10-CM | POA: Diagnosis not present

## 2020-10-14 DIAGNOSIS — I4891 Unspecified atrial fibrillation: Secondary | ICD-10-CM | POA: Diagnosis not present

## 2020-10-14 DIAGNOSIS — I739 Peripheral vascular disease, unspecified: Secondary | ICD-10-CM | POA: Diagnosis not present

## 2020-10-14 DIAGNOSIS — E261 Secondary hyperaldosteronism: Secondary | ICD-10-CM | POA: Diagnosis not present

## 2020-10-16 ENCOUNTER — Other Ambulatory Visit (HOSPITAL_COMMUNITY): Payer: Self-pay

## 2020-10-18 ENCOUNTER — Other Ambulatory Visit (HOSPITAL_COMMUNITY): Payer: Self-pay

## 2020-10-23 ENCOUNTER — Other Ambulatory Visit (HOSPITAL_COMMUNITY): Payer: Self-pay

## 2020-10-30 ENCOUNTER — Other Ambulatory Visit (HOSPITAL_COMMUNITY): Payer: Self-pay

## 2020-11-02 ENCOUNTER — Other Ambulatory Visit (HOSPITAL_COMMUNITY): Payer: Self-pay | Admitting: Internal Medicine

## 2020-11-02 ENCOUNTER — Other Ambulatory Visit: Payer: Self-pay | Admitting: Cardiology

## 2020-11-02 ENCOUNTER — Other Ambulatory Visit (HOSPITAL_COMMUNITY): Payer: Self-pay

## 2020-11-02 MED ORDER — ENTRESTO 24-26 MG PO TABS
1.0000 | ORAL_TABLET | Freq: Two times a day (BID) | ORAL | 11 refills | Status: DC
  Filled 2020-11-02: qty 60, 30d supply, fill #0
  Filled 2020-12-04: qty 60, 30d supply, fill #1
  Filled 2021-01-02: qty 60, 30d supply, fill #2
  Filled 2021-02-03: qty 60, 30d supply, fill #3
  Filled 2021-03-05: qty 60, 30d supply, fill #4
  Filled 2021-04-06: qty 60, 30d supply, fill #5
  Filled 2021-05-07: qty 60, 30d supply, fill #6
  Filled 2021-06-05: qty 60, 30d supply, fill #7
  Filled 2021-07-06: qty 60, 30d supply, fill #8
  Filled 2021-08-06: qty 60, 30d supply, fill #9
  Filled 2021-09-05: qty 60, 30d supply, fill #10
  Filled 2021-10-08: qty 60, 30d supply, fill #11

## 2020-11-02 MED ORDER — SPIRONOLACTONE 25 MG PO TABS
ORAL_TABLET | Freq: Every day | ORAL | 3 refills | Status: DC
  Filled 2020-11-02: qty 90, 90d supply, fill #0
  Filled 2021-02-03: qty 90, 90d supply, fill #1
  Filled 2021-05-04: qty 90, 90d supply, fill #2
  Filled 2021-08-01: qty 90, 90d supply, fill #3

## 2020-11-06 ENCOUNTER — Other Ambulatory Visit: Payer: Self-pay | Admitting: Cardiology

## 2020-11-06 ENCOUNTER — Other Ambulatory Visit (HOSPITAL_COMMUNITY): Payer: Self-pay

## 2020-11-06 MED FILL — Apixaban Tab 5 MG: ORAL | 30 days supply | Qty: 60 | Fill #0 | Status: AC

## 2020-11-10 NOTE — Progress Notes (Signed)
No ICM remote transmission received for 11/06/2020 and next ICM transmission scheduled for 12/04/2020.

## 2020-11-15 ENCOUNTER — Ambulatory Visit (INDEPENDENT_AMBULATORY_CARE_PROVIDER_SITE_OTHER): Payer: Medicare PPO

## 2020-11-15 DIAGNOSIS — I442 Atrioventricular block, complete: Secondary | ICD-10-CM

## 2020-11-16 ENCOUNTER — Telehealth: Payer: Self-pay

## 2020-11-16 ENCOUNTER — Other Ambulatory Visit (HOSPITAL_COMMUNITY): Payer: Self-pay

## 2020-11-16 NOTE — Telephone Encounter (Signed)
The patient called to get help with his monitor. I called tech support because he got [id number] and [id number] error codes. They are sending him a new handheld in 7-10 business days.

## 2020-11-20 DIAGNOSIS — N400 Enlarged prostate without lower urinary tract symptoms: Secondary | ICD-10-CM | POA: Diagnosis not present

## 2020-11-20 DIAGNOSIS — N529 Male erectile dysfunction, unspecified: Secondary | ICD-10-CM | POA: Diagnosis not present

## 2020-11-20 DIAGNOSIS — Z125 Encounter for screening for malignant neoplasm of prostate: Secondary | ICD-10-CM | POA: Diagnosis not present

## 2020-11-20 LAB — CUP PACEART REMOTE DEVICE CHECK
Battery Remaining Longevity: 13 mo
Battery Voltage: 2.87 V
Brady Statistic AP VP Percent: 8.08 %
Brady Statistic AP VS Percent: 0.01 %
Brady Statistic AS VP Percent: 91.44 %
Brady Statistic AS VS Percent: 0.47 %
Brady Statistic RA Percent Paced: 7.81 %
Brady Statistic RV Percent Paced: 98.96 %
Date Time Interrogation Session: 20221015234444
HighPow Impedance: 60 Ohm
Implantable Lead Implant Date: 20150209
Implantable Lead Implant Date: 20160919
Implantable Lead Implant Date: 20160919
Implantable Lead Location: 753858
Implantable Lead Location: 753859
Implantable Lead Location: 753860
Implantable Lead Model: 4598
Implantable Lead Model: 5076
Implantable Lead Model: 6935
Implantable Pulse Generator Implant Date: 20160919
Lead Channel Impedance Value: 304 Ohm
Lead Channel Impedance Value: 304 Ohm
Lead Channel Impedance Value: 323 Ohm
Lead Channel Impedance Value: 323 Ohm
Lead Channel Impedance Value: 323 Ohm
Lead Channel Impedance Value: 380 Ohm
Lead Channel Impedance Value: 380 Ohm
Lead Channel Impedance Value: 437 Ohm
Lead Channel Impedance Value: 513 Ohm
Lead Channel Impedance Value: 513 Ohm
Lead Channel Impedance Value: 513 Ohm
Lead Channel Impedance Value: 532 Ohm
Lead Channel Impedance Value: 532 Ohm
Lead Channel Pacing Threshold Amplitude: 0.625 V
Lead Channel Pacing Threshold Amplitude: 1.25 V
Lead Channel Pacing Threshold Amplitude: 2.5 V
Lead Channel Pacing Threshold Pulse Width: 0.4 ms
Lead Channel Pacing Threshold Pulse Width: 0.4 ms
Lead Channel Pacing Threshold Pulse Width: 0.4 ms
Lead Channel Sensing Intrinsic Amplitude: 2.75 mV
Lead Channel Sensing Intrinsic Amplitude: 2.75 mV
Lead Channel Sensing Intrinsic Amplitude: 7.75 mV
Lead Channel Sensing Intrinsic Amplitude: 7.75 mV
Lead Channel Setting Pacing Amplitude: 1.75 V
Lead Channel Setting Pacing Amplitude: 2.5 V
Lead Channel Setting Pacing Pulse Width: 0.4 ms
Lead Channel Setting Sensing Sensitivity: 0.3 mV

## 2020-11-21 ENCOUNTER — Other Ambulatory Visit (HOSPITAL_COMMUNITY): Payer: Self-pay

## 2020-11-21 ENCOUNTER — Other Ambulatory Visit: Payer: Self-pay | Admitting: Physician Assistant

## 2020-11-21 MED ORDER — ISOSORBIDE MONONITRATE ER 30 MG PO TB24
30.0000 mg | ORAL_TABLET | Freq: Every day | ORAL | 3 refills | Status: DC
  Filled 2020-11-21: qty 30, 30d supply, fill #0
  Filled 2020-12-21: qty 30, 30d supply, fill #1
  Filled 2021-01-22: qty 30, 30d supply, fill #2
  Filled 2021-02-22: qty 30, 30d supply, fill #3

## 2020-11-23 NOTE — Progress Notes (Signed)
Remote ICD transmission.   

## 2020-11-29 ENCOUNTER — Other Ambulatory Visit (HOSPITAL_COMMUNITY): Payer: Self-pay

## 2020-12-04 ENCOUNTER — Ambulatory Visit (INDEPENDENT_AMBULATORY_CARE_PROVIDER_SITE_OTHER): Payer: Medicare PPO

## 2020-12-04 ENCOUNTER — Other Ambulatory Visit (HOSPITAL_COMMUNITY): Payer: Self-pay

## 2020-12-04 DIAGNOSIS — Z95810 Presence of automatic (implantable) cardiac defibrillator: Secondary | ICD-10-CM | POA: Diagnosis not present

## 2020-12-04 DIAGNOSIS — I5022 Chronic systolic (congestive) heart failure: Secondary | ICD-10-CM | POA: Diagnosis not present

## 2020-12-06 ENCOUNTER — Other Ambulatory Visit (HOSPITAL_COMMUNITY): Payer: Self-pay

## 2020-12-06 MED FILL — Apixaban Tab 5 MG: ORAL | 30 days supply | Qty: 60 | Fill #1 | Status: AC

## 2020-12-06 NOTE — Progress Notes (Signed)
EPIC Encounter for ICM Monitoring  Patient Name: Philip Chavez is a 70 y.o. male Date: 12/06/2020 Primary Care Physican: System, Provider Not In Primary Cardiologist: Aundra Dubin Electrophysiologist: Lovena Le RV Pacing:  98.9%        12/06/2020 Weight: 203-205 lbs   Since 18-Nov-2020 VT-NS (>4 beats, >162 bpm) 2 AT/AF 7 Time in AT/AF <0.1 hr/day (<0.1%) Longest AT/AF 5 minutes                   Spoke with patient and heart failure questions reviewed.  Pt asymptomatic for fluid accumulation and feeling well.   OptiVol Thoracic impedance suggesting normal fluid levels.     Prescribed: Furosemide 20 mg 1 tablet twice a day.   Labs: 10/23/2020 Creatinine 1.38, BUN 16, Potassium 3.6, Sodium 136, GFR 55 09/18/2020 Creatinine 1.71, BUN 22, Potassium 4.3, Sodium 137, GFR 43  08/03/2020 Creatinine 1.27, BUN 22, Potassium 3.9, Sodium 139, GFR >60  07/27/2020 Creatinine 1.32, BUN 16, Potassium 3.7, Sodium 135, GFR 58  07/26/2020 Creatinine 1.19, BUN 12, Potassium 4.1, Sodium 134, GFR >60  07/25/2020 Creatinine 1.28, BUN 16, Potassium 3.7, Sodium 136, GFR >60  07/24/2020 Creatinine 1.41, BUN 16, Potassium 3.9, Sodium 138, GFR 54  A complete set of results can be found in Results Review.   Recommendations:  No changes and encouraged to call if experiencing any fluid symptoms.   Follow-up plan: ICM clinic phone appointment on 01/08/2021.   91 day device clinic remote transmission 02/14/2021.     EP/Cardiology Office Visits:  Recall 03/04/2021 with Dr Lovena Le.    Copy of ICM check sent to Dr. Lovena Le.     3 month ICM trend: 12/06/2020.    1 Year ICM trend:       Rosalene Billings, RN 12/06/2020 8:53 AM

## 2020-12-16 ENCOUNTER — Other Ambulatory Visit (HOSPITAL_COMMUNITY): Payer: Self-pay

## 2020-12-21 ENCOUNTER — Other Ambulatory Visit (HOSPITAL_COMMUNITY): Payer: Self-pay

## 2020-12-29 ENCOUNTER — Other Ambulatory Visit (HOSPITAL_COMMUNITY): Payer: Self-pay

## 2021-01-02 ENCOUNTER — Other Ambulatory Visit (HOSPITAL_COMMUNITY): Payer: Self-pay

## 2021-01-06 ENCOUNTER — Other Ambulatory Visit: Payer: Self-pay | Admitting: Cardiology

## 2021-01-08 ENCOUNTER — Ambulatory Visit (INDEPENDENT_AMBULATORY_CARE_PROVIDER_SITE_OTHER): Payer: Medicare PPO

## 2021-01-08 DIAGNOSIS — I5022 Chronic systolic (congestive) heart failure: Secondary | ICD-10-CM | POA: Diagnosis not present

## 2021-01-08 DIAGNOSIS — Z95810 Presence of automatic (implantable) cardiac defibrillator: Secondary | ICD-10-CM | POA: Diagnosis not present

## 2021-01-09 ENCOUNTER — Other Ambulatory Visit (HOSPITAL_COMMUNITY): Payer: Self-pay

## 2021-01-09 MED ORDER — APIXABAN 5 MG PO TABS
5.0000 mg | ORAL_TABLET | Freq: Two times a day (BID) | ORAL | 2 refills | Status: DC
  Filled 2021-01-09: qty 60, 30d supply, fill #0
  Filled 2021-02-05: qty 60, 30d supply, fill #1
  Filled 2021-03-06: qty 60, 30d supply, fill #2

## 2021-01-10 NOTE — Progress Notes (Signed)
EPIC Encounter for ICM Monitoring  Patient Name: Philip Chavez is a 70 y.o. male Date: 01/10/2021 Primary Care Physican: System, Provider Not In Primary Cardiologist: Aundra Dubin Electrophysiologist: Lovena Le RV Pacing:  98.7%        12/06/2020 Weight: 203-205 lbs   Since 04-Dec-2020 VT-NS (>4 beats, >162 bpm)    5 Time in AT/AF <0.1 hr/day (0.4%) Longest AT/AF 42 minutes                  Spoke with patient and heart failure questions reviewed.  Pt asymptomatic for fluid accumulation and feeling well.   OptiVol Thoracic impedance suggesting normal fluid levels.     Prescribed: Furosemide 20 mg 1 tablet twice a day.   Labs: 10/23/2020 Creatinine 1.38, BUN 16, Potassium 3.6, Sodium 136, GFR 55 09/18/2020 Creatinine 1.71, BUN 22, Potassium 4.3, Sodium 137, GFR 43  08/03/2020 Creatinine 1.27, BUN 22, Potassium 3.9, Sodium 139, GFR >60  07/27/2020 Creatinine 1.32, BUN 16, Potassium 3.7, Sodium 135, GFR 58  07/26/2020 Creatinine 1.19, BUN 12, Potassium 4.1, Sodium 134, GFR >60  07/25/2020 Creatinine 1.28, BUN 16, Potassium 3.7, Sodium 136, GFR >60  07/24/2020 Creatinine 1.41, BUN 16, Potassium 3.9, Sodium 138, GFR 54  A complete set of results can be found in Results Review.   Recommendations:  No changes and encouraged to call if experiencing any fluid symptoms.   Follow-up plan: ICM clinic phone appointment on 02/12/2021.   91 day device clinic remote transmission 02/14/2021.     EP/Cardiology Office Visits:  Recall 03/04/2021 with Dr Lovena Le.    Copy of ICM check sent to Dr. Lovena Le.     3 month ICM trend: 01/08/2021.    12-14 Month ICM trend:       Rosalene Billings, RN 01/10/2021 9:02 AM

## 2021-01-18 ENCOUNTER — Other Ambulatory Visit (HOSPITAL_COMMUNITY): Payer: Self-pay

## 2021-01-19 ENCOUNTER — Other Ambulatory Visit (HOSPITAL_COMMUNITY): Payer: Self-pay

## 2021-01-22 ENCOUNTER — Other Ambulatory Visit (HOSPITAL_COMMUNITY): Payer: Self-pay

## 2021-01-29 ENCOUNTER — Other Ambulatory Visit: Payer: Self-pay | Admitting: Physician Assistant

## 2021-01-30 ENCOUNTER — Other Ambulatory Visit: Payer: Self-pay

## 2021-01-30 ENCOUNTER — Other Ambulatory Visit (HOSPITAL_COMMUNITY): Payer: Self-pay

## 2021-01-30 MED ORDER — POTASSIUM CHLORIDE ER 10 MEQ PO TBCR: 10.0000 meq | EXTENDED_RELEASE_TABLET | Freq: Two times a day (BID) | ORAL | 6 refills | Status: DC

## 2021-01-30 MED ORDER — DOFETILIDE 500 MCG PO CAPS
500.0000 ug | ORAL_CAPSULE | Freq: Two times a day (BID) | ORAL | 1 refills | Status: DC
  Filled 2021-01-30: qty 60, 30d supply, fill #0
  Filled 2021-03-01: qty 60, 30d supply, fill #1

## 2021-02-01 ENCOUNTER — Other Ambulatory Visit (HOSPITAL_COMMUNITY): Payer: Self-pay

## 2021-02-04 ENCOUNTER — Other Ambulatory Visit (HOSPITAL_COMMUNITY): Payer: Self-pay

## 2021-02-05 ENCOUNTER — Other Ambulatory Visit (HOSPITAL_COMMUNITY): Payer: Self-pay

## 2021-02-05 ENCOUNTER — Other Ambulatory Visit: Payer: Self-pay | Admitting: Cardiology

## 2021-02-06 ENCOUNTER — Other Ambulatory Visit (HOSPITAL_COMMUNITY): Payer: Self-pay

## 2021-02-06 MED ORDER — DAPAGLIFLOZIN PROPANEDIOL 10 MG PO TABS
10.0000 mg | ORAL_TABLET | Freq: Every day | ORAL | 1 refills | Status: DC
  Filled 2021-02-06: qty 30, 30d supply, fill #0
  Filled 2021-03-05: qty 30, 30d supply, fill #1

## 2021-02-12 ENCOUNTER — Ambulatory Visit (INDEPENDENT_AMBULATORY_CARE_PROVIDER_SITE_OTHER): Payer: Medicare PPO

## 2021-02-12 DIAGNOSIS — I5022 Chronic systolic (congestive) heart failure: Secondary | ICD-10-CM

## 2021-02-12 DIAGNOSIS — Z95810 Presence of automatic (implantable) cardiac defibrillator: Secondary | ICD-10-CM | POA: Diagnosis not present

## 2021-02-14 ENCOUNTER — Ambulatory Visit (INDEPENDENT_AMBULATORY_CARE_PROVIDER_SITE_OTHER): Payer: Medicare PPO

## 2021-02-14 DIAGNOSIS — I442 Atrioventricular block, complete: Secondary | ICD-10-CM | POA: Diagnosis not present

## 2021-02-14 LAB — CUP PACEART REMOTE DEVICE CHECK
Battery Remaining Longevity: 10 mo
Battery Voltage: 2.85 V
Brady Statistic AP VP Percent: 3.56 %
Brady Statistic AP VS Percent: 0 %
Brady Statistic AS VP Percent: 96.07 %
Brady Statistic AS VS Percent: 0.37 %
Brady Statistic RA Percent Paced: 3.5 %
Brady Statistic RV Percent Paced: 99.3 %
Date Time Interrogation Session: 20230111033427
HighPow Impedance: 57 Ohm
Implantable Lead Implant Date: 20150209
Implantable Lead Implant Date: 20160919
Implantable Lead Implant Date: 20160919
Implantable Lead Location: 753858
Implantable Lead Location: 753859
Implantable Lead Location: 753860
Implantable Lead Model: 4598
Implantable Lead Model: 5076
Implantable Lead Model: 6935
Implantable Pulse Generator Implant Date: 20160919
Lead Channel Impedance Value: 266 Ohm
Lead Channel Impedance Value: 266 Ohm
Lead Channel Impedance Value: 266 Ohm
Lead Channel Impedance Value: 304 Ohm
Lead Channel Impedance Value: 304 Ohm
Lead Channel Impedance Value: 323 Ohm
Lead Channel Impedance Value: 342 Ohm
Lead Channel Impedance Value: 399 Ohm
Lead Channel Impedance Value: 456 Ohm
Lead Channel Impedance Value: 456 Ohm
Lead Channel Impedance Value: 494 Ohm
Lead Channel Impedance Value: 494 Ohm
Lead Channel Impedance Value: 494 Ohm
Lead Channel Pacing Threshold Amplitude: 0.75 V
Lead Channel Pacing Threshold Amplitude: 0.875 V
Lead Channel Pacing Threshold Amplitude: 2.5 V
Lead Channel Pacing Threshold Pulse Width: 0.4 ms
Lead Channel Pacing Threshold Pulse Width: 0.4 ms
Lead Channel Pacing Threshold Pulse Width: 0.4 ms
Lead Channel Sensing Intrinsic Amplitude: 10.375 mV
Lead Channel Sensing Intrinsic Amplitude: 10.375 mV
Lead Channel Sensing Intrinsic Amplitude: 2.625 mV
Lead Channel Sensing Intrinsic Amplitude: 2.625 mV
Lead Channel Setting Pacing Amplitude: 1.5 V
Lead Channel Setting Pacing Amplitude: 2 V
Lead Channel Setting Pacing Pulse Width: 0.4 ms
Lead Channel Setting Sensing Sensitivity: 0.3 mV

## 2021-02-14 NOTE — Progress Notes (Signed)
EPIC Encounter for ICM Monitoring  Patient Name: Philip Chavez is a 71 y.o. male Date: 02/14/2021 Primary Care Physican: System, Provider Not In Primary Cardiologist: Aundra Dubin Electrophysiologist: Lovena Le RV Pacing:  99.3%        02/14/2021 Weight: 203-205 lbs   Time in AT/AF 0.1 hr/day (<0.1%)          Spoke with patient and heart failure questions reviewed.  Pt asymptomatic for fluid accumulation and feeling well.   OptiVol Thoracic impedance suggesting normal fluid levels.     Prescribed: Furosemide 20 mg 1 tablet twice a day.   Labs: 09/22/2020 Creatinine 1.38, BUN 16, Potassium 3.6, Sodium 136, GFR 55 09/18/2020 Creatinine 1.71, BUN 22, Potassium 4.3, Sodium 137, GFR 43  08/03/2020 Creatinine 1.27, BUN 22, Potassium 3.9, Sodium 139, GFR >60  07/27/2020 Creatinine 1.32, BUN 16, Potassium 3.7, Sodium 135, GFR 58  07/26/2020 Creatinine 1.19, BUN 12, Potassium 4.1, Sodium 134, GFR >60  07/25/2020 Creatinine 1.28, BUN 16, Potassium 3.7, Sodium 136, GFR >60  07/24/2020 Creatinine 1.41, BUN 16, Potassium 3.9, Sodium 138, GFR 54  A complete set of results can be found in Results Review.   Recommendations:  No changes and encouraged to call if experiencing any fluid symptoms.   Follow-up plan: ICM clinic phone appointment on 03/19/2021.   91 day device clinic remote transmission 05/16/2021.     EP/Cardiology Office Visits:    Advised to call the offices of Dr Lovena Le and Dr Aundra Dubin to make appointments.   Recall 03/04/2021 with Dr Lovena Le.   Overdue to make appointment with Dr Aundra Dubin, last OV was 05/03/2020.   Copy of ICM check sent to Dr. Lovena Le.     3 month ICM trend: 02/14/2021.    12-14 Month ICM trend:     Rosalene Billings, RN 02/14/2021 12:45 PM

## 2021-02-17 ENCOUNTER — Other Ambulatory Visit (HOSPITAL_COMMUNITY): Payer: Self-pay

## 2021-02-22 ENCOUNTER — Other Ambulatory Visit (HOSPITAL_COMMUNITY): Payer: Self-pay

## 2021-02-23 NOTE — Progress Notes (Signed)
Remote ICD transmission.   

## 2021-03-02 ENCOUNTER — Other Ambulatory Visit (HOSPITAL_COMMUNITY): Payer: Self-pay

## 2021-03-03 ENCOUNTER — Other Ambulatory Visit (HOSPITAL_COMMUNITY): Payer: Self-pay

## 2021-03-05 ENCOUNTER — Other Ambulatory Visit (HOSPITAL_COMMUNITY): Payer: Self-pay

## 2021-03-06 ENCOUNTER — Other Ambulatory Visit (HOSPITAL_COMMUNITY): Payer: Self-pay

## 2021-03-19 ENCOUNTER — Ambulatory Visit (INDEPENDENT_AMBULATORY_CARE_PROVIDER_SITE_OTHER): Payer: Medicare PPO

## 2021-03-19 DIAGNOSIS — I5022 Chronic systolic (congestive) heart failure: Secondary | ICD-10-CM

## 2021-03-19 DIAGNOSIS — Z95810 Presence of automatic (implantable) cardiac defibrillator: Secondary | ICD-10-CM

## 2021-03-20 ENCOUNTER — Other Ambulatory Visit (HOSPITAL_COMMUNITY): Payer: Self-pay

## 2021-03-21 ENCOUNTER — Other Ambulatory Visit: Payer: Self-pay | Admitting: Physician Assistant

## 2021-03-21 ENCOUNTER — Other Ambulatory Visit (HOSPITAL_COMMUNITY): Payer: Self-pay

## 2021-03-21 MED ORDER — ISOSORBIDE MONONITRATE ER 30 MG PO TB24
30.0000 mg | ORAL_TABLET | Freq: Every day | ORAL | 6 refills | Status: DC
  Filled 2021-03-21: qty 30, 30d supply, fill #0
  Filled 2021-04-22: qty 30, 30d supply, fill #1
  Filled 2021-05-22: qty 30, 30d supply, fill #2
  Filled 2021-06-21: qty 30, 30d supply, fill #3
  Filled 2021-07-21: qty 30, 30d supply, fill #4
  Filled 2021-08-22: qty 30, 30d supply, fill #5
  Filled 2021-09-21: qty 30, 30d supply, fill #6

## 2021-03-21 NOTE — Progress Notes (Signed)
EPIC Encounter for ICM Monitoring  Patient Name: Philip Chavez is a 71 y.o. male Date: 03/21/2021 Primary Care Physican: System, Provider Not In Primary Cardiologist: Aundra Dubin Electrophysiologist: Lovena Le RV Pacing:  98.8%        02/14/2021 Weight: 203-205 lbs   Since 14-Feb-2021 VT-NS (>4 beats, >162 bpm) 3 AT/AF 9 Time in AT/AF <0.1 hr/day (0.2%) Longest AT/AF 70 minutes           Spoke with patient and heart failure questions reviewed.  Pt asymptomatic for fluid accumulation and feeling well.   OptiVol Thoracic impedance suggesting normal fluid levels.     Prescribed: Furosemide 20 mg 1 tablet twice a day.   Labs: 09/22/2020 Creatinine 1.38, BUN 16, Potassium 3.6, Sodium 136, GFR 55 09/18/2020 Creatinine 1.71, BUN 22, Potassium 4.3, Sodium 137, GFR 43  08/03/2020 Creatinine 1.27, BUN 22, Potassium 3.9, Sodium 139, GFR >60  07/27/2020 Creatinine 1.32, BUN 16, Potassium 3.7, Sodium 135, GFR 58  07/26/2020 Creatinine 1.19, BUN 12, Potassium 4.1, Sodium 134, GFR >60  07/25/2020 Creatinine 1.28, BUN 16, Potassium 3.7, Sodium 136, GFR >60  07/24/2020 Creatinine 1.41, BUN 16, Potassium 3.9, Sodium 138, GFR 54  A complete set of results can be found in Results Review.   Recommendations:  No changes and encouraged to call if experiencing any fluid symptoms.   Follow-up plan: ICM clinic phone appointment on 04/23/2021.   91 day device clinic remote transmission 05/16/2021.     EP/Cardiology Office Visits:    06/22/2021 with Dr Lovena Le.  Advised to call Dr Claris Gladden office to make appointment since last visit was 05/03/2020.   Copy of ICM check sent to Dr. Lovena Le.     3 month ICM trend: 03/19/2021.    12-14 Month ICM trend:     Rosalene Billings, RN 03/21/2021 2:11 PM

## 2021-03-23 DIAGNOSIS — I509 Heart failure, unspecified: Secondary | ICD-10-CM | POA: Diagnosis not present

## 2021-03-23 DIAGNOSIS — Z87891 Personal history of nicotine dependence: Secondary | ICD-10-CM | POA: Diagnosis not present

## 2021-03-23 DIAGNOSIS — Z7901 Long term (current) use of anticoagulants: Secondary | ICD-10-CM | POA: Diagnosis not present

## 2021-03-23 DIAGNOSIS — E785 Hyperlipidemia, unspecified: Secondary | ICD-10-CM | POA: Diagnosis not present

## 2021-03-23 DIAGNOSIS — I25119 Atherosclerotic heart disease of native coronary artery with unspecified angina pectoris: Secondary | ICD-10-CM | POA: Diagnosis not present

## 2021-03-23 DIAGNOSIS — I4891 Unspecified atrial fibrillation: Secondary | ICD-10-CM | POA: Diagnosis not present

## 2021-03-23 DIAGNOSIS — Z8249 Family history of ischemic heart disease and other diseases of the circulatory system: Secondary | ICD-10-CM | POA: Diagnosis not present

## 2021-03-23 DIAGNOSIS — I110 Hypertensive heart disease with heart failure: Secondary | ICD-10-CM | POA: Diagnosis not present

## 2021-03-23 DIAGNOSIS — Z95810 Presence of automatic (implantable) cardiac defibrillator: Secondary | ICD-10-CM | POA: Diagnosis not present

## 2021-04-03 ENCOUNTER — Other Ambulatory Visit: Payer: Self-pay | Admitting: Internal Medicine

## 2021-04-03 ENCOUNTER — Other Ambulatory Visit: Payer: Self-pay | Admitting: Cardiology

## 2021-04-03 ENCOUNTER — Other Ambulatory Visit (HOSPITAL_COMMUNITY): Payer: Self-pay

## 2021-04-03 MED ORDER — DOFETILIDE 500 MCG PO CAPS
500.0000 ug | ORAL_CAPSULE | Freq: Two times a day (BID) | ORAL | 3 refills | Status: DC
  Filled 2021-04-03: qty 60, 30d supply, fill #0
  Filled 2021-05-04: qty 60, 30d supply, fill #1
  Filled 2021-06-02: qty 60, 30d supply, fill #2
  Filled 2021-07-05: qty 60, 30d supply, fill #3

## 2021-04-04 ENCOUNTER — Other Ambulatory Visit (HOSPITAL_COMMUNITY): Payer: Self-pay

## 2021-04-04 MED ORDER — DAPAGLIFLOZIN PROPANEDIOL 10 MG PO TABS
10.0000 mg | ORAL_TABLET | Freq: Every day | ORAL | 0 refills | Status: DC
  Filled 2021-04-04: qty 30, 30d supply, fill #0

## 2021-04-06 ENCOUNTER — Other Ambulatory Visit (HOSPITAL_COMMUNITY): Payer: Self-pay

## 2021-04-07 ENCOUNTER — Other Ambulatory Visit: Payer: Self-pay | Admitting: Cardiology

## 2021-04-09 ENCOUNTER — Other Ambulatory Visit (HOSPITAL_COMMUNITY): Payer: Self-pay

## 2021-04-09 ENCOUNTER — Other Ambulatory Visit: Payer: Self-pay | Admitting: Cardiology

## 2021-04-09 MED FILL — Apixaban Tab 5 MG: ORAL | 30 days supply | Qty: 60 | Fill #0 | Status: AC

## 2021-04-12 ENCOUNTER — Other Ambulatory Visit (HOSPITAL_COMMUNITY): Payer: Self-pay

## 2021-04-18 ENCOUNTER — Telehealth: Payer: Self-pay | Admitting: *Deleted

## 2021-04-18 ENCOUNTER — Other Ambulatory Visit (HOSPITAL_COMMUNITY): Payer: Self-pay

## 2021-04-18 DIAGNOSIS — Z86010 Personal history of colonic polyps: Secondary | ICD-10-CM | POA: Diagnosis not present

## 2021-04-18 DIAGNOSIS — Z7901 Long term (current) use of anticoagulants: Secondary | ICD-10-CM | POA: Diagnosis not present

## 2021-04-18 NOTE — Telephone Encounter (Signed)
? ?  Pre-operative Risk Assessment  ?  ?Patient Name: Philip Chavez  ?DOB: [birth year] ?MRN: [id number]  ? ?  ? ?Request for Surgical Clearance   ? ?Procedure:   COLONOSCOPY ? ?Date of Surgery:  Clearance 07/25/21                              ?   ?Surgeon:  DR. Michail Sermon ?Surgeon's Group or Practice Name:  [facility] ?Phone number:  [phone] ?Fax number:  [phone] ?  ?Type of Clearance Requested:   ?- Pharmacy:  Hold Apixaban (Eliquis) 2 DAYS ?  ?Type of Anesthesia:   PROPOFOL ?  ?Additional requests/questions:   ? ?Signed, ?Jeanann Lewandowsky   ?04/18/2021, 3:15 PM  ? ?

## 2021-04-18 NOTE — Telephone Encounter (Signed)
Clinical pharmacist to review Eliquis 

## 2021-04-18 NOTE — Telephone Encounter (Signed)
Patient with diagnosis of afib on Eliquis for anticoagulation.   ? ?Procedure: colonoscopy ?Date of procedure: 07/25/21 ? ?CHA2DS2-VASc Score = 6  ?This indicates a 9.7% annual risk of stroke. ?The patient's score is based upon: ?CHF History: 1 ?HTN History: 1 ?Diabetes History: 1 ?Stroke History: 2 ?Vascular Disease History: 0 ?Age Score: 1 ?Gender Score: 0 ?  ?DM not listed on problem list but most recent A1c from 2016 is in the diabetic range at 6.7. 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 ? ?Also has history of recurrent DVT and PE. ? ?CrCl 44m/min ?Platelet count 209K ? ?Per office protocol, patient can hold Eliquis for 1 day prior to procedure. He should resume as soon as safely possible after given elevated CV risk. ?

## 2021-04-18 NOTE — Telephone Encounter (Signed)
GI procedure in June.  Patient has upcoming follow-up with Dr. Lovena Le in May.  Cardiac clearance can be addressed at that time. ?

## 2021-04-23 ENCOUNTER — Other Ambulatory Visit (HOSPITAL_COMMUNITY): Payer: Self-pay

## 2021-04-23 ENCOUNTER — Ambulatory Visit (INDEPENDENT_AMBULATORY_CARE_PROVIDER_SITE_OTHER): Payer: Medicare PPO

## 2021-04-23 DIAGNOSIS — Z95810 Presence of automatic (implantable) cardiac defibrillator: Secondary | ICD-10-CM | POA: Diagnosis not present

## 2021-04-23 DIAGNOSIS — I5022 Chronic systolic (congestive) heart failure: Secondary | ICD-10-CM | POA: Diagnosis not present

## 2021-04-25 ENCOUNTER — Telehealth: Payer: Self-pay

## 2021-04-25 NOTE — Progress Notes (Addendum)
EPIC Encounter for ICM Monitoring ? ?Patient Name: Philip Chavez is a 71 y.o. male ?Date: 04/25/2021 ?Primary Care Physican: System, Provider Not In ?Primary Cardiologist: Aundra Dubin ?Electrophysiologist: Lovena Le ?RV Pacing:  98.8%        ?02/14/2021 Weight: 203-205 lbs ?  ?Since 19-Mar-2021 ?VT-NS (>4 beats, >162 bpm)    6 ?AT/AF 7 ?Time in AT/AF 0.2 hr/day (0.7%) ?Longest AT/AF 3 hours ?  ?  ?      Attempted call to patient and unable to reach.  Left detailed message per DPR regarding transmission. Transmission reviewed.  ?  ?OptiVol Thoracic impedance suggesting normal fluid levels.   ?  ?Prescribed: Furosemide 20 mg 1 tablet twice a day. ?  ?Labs: ?09/22/2020 Creatinine 1.38, BUN 16, Potassium 3.6, Sodium 136, GFR 55 ?09/18/2020 Creatinine 1.71, BUN 22, Potassium 4.3, Sodium 137, GFR 43  ?08/03/2020 Creatinine 1.27, BUN 22, Potassium 3.9, Sodium 139, GFR >60  ?07/27/2020 Creatinine 1.32, BUN 16, Potassium 3.7, Sodium 135, GFR 58  ?07/26/2020 Creatinine 1.19, BUN 12, Potassium 4.1, Sodium 134, GFR >60  ?07/25/2020 Creatinine 1.28, BUN 16, Potassium 3.7, Sodium 136, GFR >60  ?07/24/2020 Creatinine 1.41, BUN 16, Potassium 3.9, Sodium 138, GFR 54  ?A complete set of results can be found in Results Review. ?  ?Recommendations:  NLeft voice mail with ICM number and encouraged to call if experiencing any fluid symptoms. ?  ?Follow-up plan: ICM clinic phone appointment on 05/28/2021.   91 day device clinic remote transmission 05/16/2021.   ?  ?EP/Cardiology Office Visits:    06/22/2021 with Dr Lovena Le.  Pt aware to call Dr Claris Gladden office to make appointment since last visit was 05/03/2020. ?  ?Copy of ICM check sent to Dr. Lovena Le.    ? ?3 month ICM trend: 04/23/2021. ? ? ? ?12-14 Month ICM trend:  ? ? ? ?Rosalene Billings, RN ?04/25/2021 ?2:30 PM ? ?

## 2021-04-25 NOTE — Telephone Encounter (Signed)
Remote ICM transmission received.  Attempted call to patient regarding ICM remote transmission and left detailed message per DPR.  Advised to return call for any fluid symptoms or questions. Next ICM remote transmission scheduled 05/28/2021.   ? ?

## 2021-05-02 NOTE — Progress Notes (Signed)
? ? ?  SUBJECTIVE:  ? ?CHIEF COMPLAINT / HPI: left lower flank pain  ? ?Patient reports having left lower flank pain while fixing a kitchen appliance. He reports almost two weeks of left sided flank and back pain.Patient denies dysuria, hematuria. He states that he is not aware of any kidney disease and denies symptoms of colicky pain in the area. He denies any recent injuries to the area. Patient denies presence of fever or chills. He denies any changes to his bowel habits such as diarrhea. Denies nausea. Patient reports hx of hernia.   ? ? ?PERTINENT  PMH / PSH:  ?HF  ?AF  ?Heart block  ?Post herpetic neuralgia  ?Hx of DVT  ?PE  ?CKD ? ?OBJECTIVE:  ? ?BP 105/70   Pulse 100   Ht '6\' 1"'$  (1.854 m)   Wt 203 lb (92.1 kg)   BMI 26.78 kg/m?   ?Physical Exam ?Constitutional:   ?   General: He is not in acute distress. ?   Appearance: Normal appearance. He is not toxic-appearing.  ?Cardiovascular:  ?   Rate and Rhythm: Normal rate.  ?   Pulses: Normal pulses.  ?Pulmonary:  ?   Effort: Pulmonary effort is normal.  ?   Breath sounds: Normal breath sounds. No wheezing or rales.  ?Abdominal:  ?   General: Bowel sounds are normal. There is no distension.  ?   Palpations: Abdomen is soft.  ?   Tenderness: There is no abdominal tenderness. There is no right CVA tenderness, left CVA tenderness, guarding or rebound.  ?   Hernia: A hernia is present.  ?Skin: ?   Findings: No erythema.  ?Neurological:  ?   Mental Status: He is alert and oriented to person, place, and time.  ? ? ? ?ASSESSMENT/PLAN:  ? ?Flank pain ?Unable to ilicit flank pain on today's exam and absence of urinary symptoms  ?Will treat with voltaren gel  ?Patient will avoid oral NSAIDS due to hx of HF and current AC  ? ?Glucosuria ?Consistent with dapagliflozin therapy  ?Will check hemoglobin A1c due to hx of A1c of 6.7 in the past ?F/u in 2 weeks ?  ? ? ?Eulis Foster, MD ?[place]  ?

## 2021-05-03 ENCOUNTER — Ambulatory Visit: Payer: Medicare PPO | Admitting: Family Medicine

## 2021-05-03 VITALS — BP 105/70 | HR 100 | Ht 73.0 in | Wt 203.0 lb

## 2021-05-03 DIAGNOSIS — R109 Unspecified abdominal pain: Secondary | ICD-10-CM | POA: Insufficient documentation

## 2021-05-03 DIAGNOSIS — R81 Glycosuria: Secondary | ICD-10-CM

## 2021-05-03 HISTORY — DX: Unspecified abdominal pain: R10.9

## 2021-05-03 HISTORY — DX: Glycosuria: R81

## 2021-05-03 LAB — POCT GLYCOSYLATED HEMOGLOBIN (HGB A1C): Hemoglobin A1C: 5.6 % (ref 4.0–5.6)

## 2021-05-03 LAB — POCT UA - MICROSCOPIC ONLY

## 2021-05-03 LAB — POCT URINALYSIS DIP (MANUAL ENTRY)
Bilirubin, UA: NEGATIVE
Blood, UA: NEGATIVE
Glucose, UA: >=1000 mg/dL — AB
Ketones, POC UA: NEGATIVE mg/dL
Leukocytes, UA: NEGATIVE
Nitrite, UA: NEGATIVE
Protein Ur, POC: NEGATIVE mg/dL
Spec Grav, UA: 1.015 (ref 1.010–1.025)
Urobilinogen, UA: 0.2 E.U./dL
pH, UA: 5 (ref 5.0–8.0)

## 2021-05-03 MED ORDER — DICLOFENAC SODIUM 1 % EX GEL
4.0000 g | Freq: Four times a day (QID) | CUTANEOUS | 0 refills | Status: DC
  Filled 2021-05-04: qty 300, 18d supply, fill #0

## 2021-05-03 NOTE — Patient Instructions (Addendum)
We will check your blood work for diabetes as well as check on your kidney function as you have had elevated levels in the past.  ? ?I will notify you via telephone of abnormal results otherwise, please follow up in 2 weeks to discuss further management.  ? ?Please use the gel prescribed for your side pain and you can take tylenol as needed. I also recommend using a heating pad for your pain in 20 minute intervals.  ? ? ?

## 2021-05-03 NOTE — Assessment & Plan Note (Signed)
Consistent with dapagliflozin therapy  ?Will check hemoglobin A1c due to hx of A1c of 6.7 in the past ?F/u in 2 weeks ?

## 2021-05-03 NOTE — Assessment & Plan Note (Signed)
Unable to ilicit flank pain on today's exam and absence of urinary symptoms  ?Will treat with voltaren gel  ?Patient will avoid oral NSAIDS due to hx of HF and current AC  ?

## 2021-05-04 ENCOUNTER — Other Ambulatory Visit (HOSPITAL_COMMUNITY): Payer: Self-pay

## 2021-05-04 ENCOUNTER — Other Ambulatory Visit: Payer: Self-pay | Admitting: Cardiology

## 2021-05-04 LAB — CBC
Hematocrit: 44.3 % (ref 37.5–51.0)
Hemoglobin: 14.6 g/dL (ref 13.0–17.7)
MCH: 30.7 pg (ref 26.6–33.0)
MCHC: 33 g/dL (ref 31.5–35.7)
MCV: 93 fL (ref 79–97)
Platelets: 186 10*3/uL (ref 150–450)
RBC: 4.76 x10E6/uL (ref 4.14–5.80)
RDW: 14.4 % (ref 11.6–15.4)
WBC: 4.9 10*3/uL (ref 3.4–10.8)

## 2021-05-04 LAB — COMPREHENSIVE METABOLIC PANEL
ALT: 20 IU/L (ref 0–44)
AST: 23 IU/L (ref 0–40)
Albumin/Globulin Ratio: 1.8 (ref 1.2–2.2)
Albumin: 4.4 g/dL (ref 3.8–4.8)
Alkaline Phosphatase: 99 IU/L (ref 44–121)
BUN/Creatinine Ratio: 12 (ref 10–24)
BUN: 17 mg/dL (ref 8–27)
Bilirubin Total: 0.4 mg/dL (ref 0.0–1.2)
CO2: 21 mmol/L (ref 20–29)
Calcium: 9.5 mg/dL (ref 8.6–10.2)
Chloride: 103 mmol/L (ref 96–106)
Creatinine, Ser: 1.39 mg/dL — ABNORMAL HIGH (ref 0.76–1.27)
Globulin, Total: 2.5 g/dL (ref 1.5–4.5)
Glucose: 89 mg/dL (ref 70–99)
Potassium: 4.3 mmol/L (ref 3.5–5.2)
Sodium: 140 mmol/L (ref 134–144)
Total Protein: 6.9 g/dL (ref 6.0–8.5)
eGFR: 55 mL/min/{1.73_m2} — ABNORMAL LOW (ref >59)

## 2021-05-04 MED ORDER — DAPAGLIFLOZIN PROPANEDIOL 10 MG PO TABS
10.0000 mg | ORAL_TABLET | Freq: Every day | ORAL | 0 refills | Status: DC
  Filled 2021-05-04: qty 15, 15d supply, fill #0

## 2021-05-07 ENCOUNTER — Other Ambulatory Visit (HOSPITAL_COMMUNITY): Payer: Self-pay

## 2021-05-08 ENCOUNTER — Other Ambulatory Visit: Payer: Self-pay | Admitting: Cardiology

## 2021-05-08 ENCOUNTER — Other Ambulatory Visit (HOSPITAL_COMMUNITY): Payer: Self-pay

## 2021-05-08 MED ORDER — APIXABAN 5 MG PO TABS
5.0000 mg | ORAL_TABLET | Freq: Two times a day (BID) | ORAL | 0 refills | Status: DC
  Filled 2021-05-08: qty 30, 15d supply, fill #0

## 2021-05-14 ENCOUNTER — Encounter: Payer: Self-pay | Admitting: Family Medicine

## 2021-05-14 NOTE — Progress Notes (Signed)
? ? ?  SUBJECTIVE:  ? ?CHIEF COMPLAINT / HPI:  ? ?Flank pain, glucosuria ?Previously seen at [facility] 05/03/21 for 2 weeks of flank pain, rx voltaren gel ?Today reports no recurrence of flank pain ?No gross hematuria, no medications required ?Feeling well with no limitations ? ?Glucosuria, pre-diabetes ?At last [facility] visit 05/03/21, UA found glucosuria, c/w dapagliflozin therapy, patient instructed to follow up in 2 weeks ?Today patient reports tolerating medications well without adverse side effects ?Currently on farxiga 10 mg only, started by cardiologist ?No other medications ?Main cardiologist is Dr. Crissie Sickles, has appointment scheduled this summer ? ?Lab Results  ?Component Value Date  ? HGBA1C 5.6 05/03/2021  ? HGBA1C 5.8 06/04/2019  ? HGBA1C 6.7 (H) 09/03/2014  ? ?Lab Results  ?Component Value Date  ? [place] 91 02/22/2019  ? CREATININE 1.39 (H) 05/03/2021  ? 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 Flutter, third degree heart block ? ?OBJECTIVE:  ? ?BP 118/64   Pulse 78   Ht '6\' 1"'$  (1.854 m)   Wt 199 lb 12.8 oz (90.6 kg)   SpO2 98%   BMI 26.36 kg/m?   ? ?PHQ-9:  ? ?  05/18/2021  ?  1:38 PM 05/03/2021  ? 10:53 AM 06/04/2019  ?  2:59 PM  ?Depression screen PHQ 2/9  ?Decreased Interest 0 0 0  ?Down, Depressed, Hopeless 0 0 0  ?PHQ - 2 Score 0 0 0  ?Altered sleeping 0 0   ?Tired, decreased energy 0 0   ?Change in appetite 0 0   ?Feeling bad or failure about yourself  0 0   ?Trouble concentrating 0 0   ?Moving slowly or fidgety/restless 0 0   ?Suicidal thoughts 0 0   ?PHQ-9 Score 0 0   ?Difficult doing work/chores Not difficult at all Not difficult at all   ?  ?Physical Exam ?General: Awake, alert, oriented ?Cardiovascular: Regular rate and rhythm, S1 and S2 present, no murmurs auscultated ?Respiratory: Lung fields clear to auscultation bilaterally ?Extremities: No bilateral lower extremity edema, palpable pedal and pretibial pulses bilaterally ? ?ASSESSMENT/PLAN:   ? ?Prediabetes ?Last A1c normal range. Taking farxiga for renal and cardiac protection. Glucosuria noted on 3/30 consistent with farxiga. No follow up testing indicated at this time. Patient doing well without adverse side effects.  ? ?Flank pain ?Resolved. Doing well without medication or voltaren gel.  ?  ? ? ?Ezequiel Essex, MD ?[place]  ?

## 2021-05-16 ENCOUNTER — Ambulatory Visit (INDEPENDENT_AMBULATORY_CARE_PROVIDER_SITE_OTHER): Payer: Medicare PPO

## 2021-05-16 DIAGNOSIS — I428 Other cardiomyopathies: Secondary | ICD-10-CM | POA: Diagnosis not present

## 2021-05-16 LAB — CUP PACEART REMOTE DEVICE CHECK
Battery Remaining Longevity: 8 mo
Battery Voltage: 2.83 V
Brady Statistic AP VP Percent: 3.87 %
Brady Statistic AP VS Percent: 0.05 %
Brady Statistic AS VP Percent: 94.7 %
Brady Statistic AS VS Percent: 1.38 %
Brady Statistic RA Percent Paced: 3.85 %
Brady Statistic RV Percent Paced: 97.89 %
Date Time Interrogation Session: 20230412031606
HighPow Impedance: 55 Ohm
Implantable Lead Implant Date: 20150209
Implantable Lead Implant Date: 20160919
Implantable Lead Implant Date: 20160919
Implantable Lead Location: 753858
Implantable Lead Location: 753859
Implantable Lead Location: 753860
Implantable Lead Model: 4598
Implantable Lead Model: 5076
Implantable Lead Model: 6935
Implantable Pulse Generator Implant Date: 20160919
Lead Channel Impedance Value: 266 Ohm
Lead Channel Impedance Value: 266 Ohm
Lead Channel Impedance Value: 266 Ohm
Lead Channel Impedance Value: 304 Ohm
Lead Channel Impedance Value: 304 Ohm
Lead Channel Impedance Value: 304 Ohm
Lead Channel Impedance Value: 342 Ohm
Lead Channel Impedance Value: 380 Ohm
Lead Channel Impedance Value: 456 Ohm
Lead Channel Impedance Value: 456 Ohm
Lead Channel Impedance Value: 456 Ohm
Lead Channel Impedance Value: 494 Ohm
Lead Channel Impedance Value: 494 Ohm
Lead Channel Pacing Threshold Amplitude: 0.875 V
Lead Channel Pacing Threshold Amplitude: 0.875 V
Lead Channel Pacing Threshold Amplitude: 2.5 V
Lead Channel Pacing Threshold Pulse Width: 0.4 ms
Lead Channel Pacing Threshold Pulse Width: 0.4 ms
Lead Channel Pacing Threshold Pulse Width: 0.4 ms
Lead Channel Sensing Intrinsic Amplitude: 2.875 mV
Lead Channel Sensing Intrinsic Amplitude: 2.875 mV
Lead Channel Sensing Intrinsic Amplitude: 4.875 mV
Lead Channel Sensing Intrinsic Amplitude: 4.875 mV
Lead Channel Setting Pacing Amplitude: 1.75 V
Lead Channel Setting Pacing Amplitude: 2 V
Lead Channel Setting Pacing Pulse Width: 0.4 ms
Lead Channel Setting Sensing Sensitivity: 0.3 mV

## 2021-05-18 ENCOUNTER — Ambulatory Visit (INDEPENDENT_AMBULATORY_CARE_PROVIDER_SITE_OTHER): Payer: Medicare PPO

## 2021-05-18 ENCOUNTER — Ambulatory Visit: Payer: Medicare PPO | Admitting: Family Medicine

## 2021-05-18 VITALS — BP 118/64 | HR 78 | Ht 73.0 in | Wt 199.8 lb

## 2021-05-18 DIAGNOSIS — N1831 Chronic kidney disease, stage 3a: Secondary | ICD-10-CM | POA: Diagnosis not present

## 2021-05-18 DIAGNOSIS — R109 Unspecified abdominal pain: Secondary | ICD-10-CM | POA: Diagnosis not present

## 2021-05-18 DIAGNOSIS — R7303 Prediabetes: Secondary | ICD-10-CM | POA: Diagnosis not present

## 2021-05-18 DIAGNOSIS — Z23 Encounter for immunization: Secondary | ICD-10-CM

## 2021-05-18 DIAGNOSIS — E1122 Type 2 diabetes mellitus with diabetic chronic kidney disease: Secondary | ICD-10-CM

## 2021-05-18 NOTE — Patient Instructions (Signed)
It was wonderful to meet you today. Thank you for allowing me to be a part of your care. Below is a short summary of what we discussed at your visit today: ? ?Flank pain ?I am certainly glad to hear your flank pain has resolved!  We discussed your labs that were obtained at your last appointment, they all turned out normal.  Your kidney function is stable from last time, and so I do not see any progression of your chronic kidney disease. ? ?Sugar in your urine ?This was found on your urine test at the last visit.  This is because you are on the medication called Farxiga.  I do not have any reason to retest you today. ? ?COVID-vaccine ?Today you received the bivalent COVID booster. You may experience some residual soreness at the injection site.  Gentle stretches and regular use of that arm will help speed up your recovery.  As the vaccines are giving your immune system a "practice run" against specific infections, you may feel a little under the weather for the next several days.  We recommend rest as needed and hydrating. ? ?Cooking and Nutrition Classes ?[employer] in [place] provides many classes at low or no cost to [employer], nutrition, and agriculture.  Their website offers a huge variety of information related to topics such as gardening, nutrition, cooking, parenting, and health.  Also listed are classes and events, both online and in-person.  Check out their website here: [email or web address]  ? ?Food finder app ?Download the [employer] App or Call 211 to easily find food banks and pantries and other resources nearby.  ? ?Please bring all of your medications to every appointment! ? ?If you have any questions or concerns, please do not hesitate to contact us via phone or MyChart message.  ? ?Ezequiel Essex, MD  ?

## 2021-05-20 ENCOUNTER — Other Ambulatory Visit: Payer: Self-pay | Admitting: Cardiology

## 2021-05-21 ENCOUNTER — Other Ambulatory Visit (HOSPITAL_COMMUNITY): Payer: Self-pay

## 2021-05-21 MED ORDER — DAPAGLIFLOZIN PROPANEDIOL 10 MG PO TABS
10.0000 mg | ORAL_TABLET | Freq: Every day | ORAL | 0 refills | Status: DC
  Filled 2021-05-21: qty 30, 30d supply, fill #0

## 2021-05-22 ENCOUNTER — Other Ambulatory Visit (HOSPITAL_COMMUNITY): Payer: Self-pay

## 2021-05-23 ENCOUNTER — Other Ambulatory Visit: Payer: Self-pay | Admitting: Cardiology

## 2021-05-23 ENCOUNTER — Encounter: Payer: Self-pay | Admitting: Family Medicine

## 2021-05-23 ENCOUNTER — Other Ambulatory Visit (HOSPITAL_COMMUNITY): Payer: Self-pay

## 2021-05-23 DIAGNOSIS — R7303 Prediabetes: Secondary | ICD-10-CM | POA: Insufficient documentation

## 2021-05-23 MED ORDER — APIXABAN 5 MG PO TABS
5.0000 mg | ORAL_TABLET | Freq: Two times a day (BID) | ORAL | 0 refills | Status: DC
  Filled 2021-05-23: qty 30, 15d supply, fill #0

## 2021-05-23 NOTE — Assessment & Plan Note (Signed)
Last A1c normal range. Taking farxiga for renal and cardiac protection. Glucosuria noted on 3/30 consistent with farxiga. No follow up testing indicated at this time. Patient doing well without adverse side effects.  ?

## 2021-05-23 NOTE — Assessment & Plan Note (Signed)
Resolved. Doing well without medication or voltaren gel.  ?

## 2021-05-28 ENCOUNTER — Ambulatory Visit (INDEPENDENT_AMBULATORY_CARE_PROVIDER_SITE_OTHER): Payer: Medicare PPO

## 2021-05-28 DIAGNOSIS — I5022 Chronic systolic (congestive) heart failure: Secondary | ICD-10-CM | POA: Diagnosis not present

## 2021-05-28 DIAGNOSIS — Z95810 Presence of automatic (implantable) cardiac defibrillator: Secondary | ICD-10-CM | POA: Diagnosis not present

## 2021-05-30 NOTE — Progress Notes (Signed)
EPIC Encounter for ICM Monitoring ? ?Patient Name: Philip Chavez is a 71 y.o. male ?Date: 05/30/2021 ?Primary Care Physican: Ezequiel Essex, MD ?Primary Cardiologist: Aundra Dubin ?Electrophysiologist: Lovena Le ?RV Pacing:  98.5%        ?02/14/2021 Weight: 203-205 lbs ?05/30/2021 Weight: 199 lbs ?  ?Since 16-May-2021 ?VT-NS (>4 beats, >162 bpm) 4 ?AT/AF 2 ?Time in AT/AF <0.1 hr/day (<0.1%) ?Longest AT/AF 3 minutes ?  ?  ?      Spoke with patient and heart failure questions reviewed.  Pt asymptomatic for fluid accumulation.  Reports feeling well at this time and voices no complaints.  ?  ?OptiVol Thoracic impedance suggesting normal fluid levels.   ?  ?Prescribed: Furosemide 20 mg 1 tablet twice a day. ?  ?Labs: ?09/22/2020 Creatinine 1.38, BUN 16, Potassium 3.6, Sodium 136, GFR 55 ?09/18/2020 Creatinine 1.71, BUN 22, Potassium 4.3, Sodium 137, GFR 43  ?08/03/2020 Creatinine 1.27, BUN 22, Potassium 3.9, Sodium 139, GFR >60  ?07/27/2020 Creatinine 1.32, BUN 16, Potassium 3.7, Sodium 135, GFR 58  ?07/26/2020 Creatinine 1.19, BUN 12, Potassium 4.1, Sodium 134, GFR >60  ?07/25/2020 Creatinine 1.28, BUN 16, Potassium 3.7, Sodium 136, GFR >60  ?07/24/2020 Creatinine 1.41, BUN 16, Potassium 3.9, Sodium 138, GFR 54  ?A complete set of results can be found in Results Review. ?  ?Recommendations: No changes and encouraged to call if experiencing any fluid symptoms. ?  ?Follow-up plan: ICM clinic phone appointment on 07/09/2021.   91 day device clinic remote transmission 08/15/2021.   ?  ?EP/Cardiology Office Visits:  06/22/2021 with Dr Lovena Le.  Pt aware to call Dr Claris Gladden office to make appointment since last visit was 05/03/2020. ?  ?Copy of ICM check sent to Dr. Lovena Le.    ?  ?3 month ICM trend: 05/28/2021. ? ? ? ?12-14 Month ICM trend:  ? ? ? ?Rosalene Billings, RN ?05/30/2021 ?3:07 PM ? ?

## 2021-06-01 NOTE — Progress Notes (Signed)
Remote ICD transmission.   

## 2021-06-02 ENCOUNTER — Other Ambulatory Visit (HOSPITAL_COMMUNITY): Payer: Self-pay

## 2021-06-05 ENCOUNTER — Other Ambulatory Visit (HOSPITAL_COMMUNITY): Payer: Self-pay

## 2021-06-07 ENCOUNTER — Other Ambulatory Visit: Payer: Self-pay | Admitting: Cardiology

## 2021-06-07 ENCOUNTER — Other Ambulatory Visit (HOSPITAL_COMMUNITY): Payer: Self-pay

## 2021-06-08 ENCOUNTER — Other Ambulatory Visit (HOSPITAL_COMMUNITY): Payer: Self-pay

## 2021-06-08 MED ORDER — APIXABAN 5 MG PO TABS
5.0000 mg | ORAL_TABLET | Freq: Two times a day (BID) | ORAL | 0 refills | Status: DC
  Filled 2021-06-08: qty 14, 7d supply, fill #0

## 2021-06-11 ENCOUNTER — Telehealth: Payer: Self-pay | Admitting: Internal Medicine

## 2021-06-11 NOTE — Telephone Encounter (Signed)
Patient calling to speak with Dr. Forde Dandy nurse. Does not want to speak with anyone else, only her. Would not give me details what it is in regards to.  ?

## 2021-06-13 ENCOUNTER — Other Ambulatory Visit (HOSPITAL_COMMUNITY): Payer: Self-pay

## 2021-06-13 ENCOUNTER — Other Ambulatory Visit (HOSPITAL_COMMUNITY): Payer: Self-pay | Admitting: *Deleted

## 2021-06-13 MED ORDER — APIXABAN 5 MG PO TABS
5.0000 mg | ORAL_TABLET | Freq: Two times a day (BID) | ORAL | 0 refills | Status: DC
  Filled 2021-06-13: qty 60, 30d supply, fill #0

## 2021-06-14 ENCOUNTER — Other Ambulatory Visit (HOSPITAL_COMMUNITY): Payer: Self-pay

## 2021-06-14 NOTE — Telephone Encounter (Signed)
Left message for Pt requesting call back. ? ?Please transfer call if Pt calls back today 06/14/2021. ?

## 2021-06-16 ENCOUNTER — Other Ambulatory Visit: Payer: Self-pay | Admitting: Cardiology

## 2021-06-18 ENCOUNTER — Other Ambulatory Visit (HOSPITAL_COMMUNITY): Payer: Self-pay

## 2021-06-18 MED ORDER — DAPAGLIFLOZIN PROPANEDIOL 10 MG PO TABS
10.0000 mg | ORAL_TABLET | Freq: Every day | ORAL | 0 refills | Status: DC
Start: 2021-06-18 — End: 2021-07-16
  Filled 2021-06-18: qty 30, 30d supply, fill #0

## 2021-06-20 NOTE — Telephone Encounter (Signed)
Returned call to Pt. ? ?Pt was calling about his Eliquis refill.  This has been refilled by Heart Failure. ? ?He is wondering if he needs to continue to be seen by HF.  Advised this can be discussed with Dr. Lovena Le at his appt on Friday 06/22/21. ? ?Pt thanked nurse for call back. ?

## 2021-06-21 ENCOUNTER — Other Ambulatory Visit (HOSPITAL_COMMUNITY): Payer: Self-pay | Admitting: Cardiology

## 2021-06-21 ENCOUNTER — Other Ambulatory Visit (HOSPITAL_COMMUNITY): Payer: Self-pay

## 2021-06-21 MED ORDER — DIGOXIN 125 MCG PO TABS
0.1250 mg | ORAL_TABLET | Freq: Every day | ORAL | 3 refills | Status: DC
  Filled 2021-06-21: qty 90, 90d supply, fill #0
  Filled 2021-09-17: qty 90, 90d supply, fill #1

## 2021-06-22 ENCOUNTER — Ambulatory Visit: Payer: Medicare PPO | Admitting: Internal Medicine

## 2021-06-22 ENCOUNTER — Other Ambulatory Visit (HOSPITAL_COMMUNITY): Payer: Self-pay

## 2021-06-22 ENCOUNTER — Encounter: Payer: Self-pay | Admitting: Internal Medicine

## 2021-06-22 VITALS — BP 98/60 | HR 85 | Ht 73.0 in | Wt 202.0 lb

## 2021-06-22 DIAGNOSIS — Z95810 Presence of automatic (implantable) cardiac defibrillator: Secondary | ICD-10-CM | POA: Insufficient documentation

## 2021-06-22 DIAGNOSIS — I442 Atrioventricular block, complete: Secondary | ICD-10-CM | POA: Diagnosis not present

## 2021-06-22 DIAGNOSIS — I428 Other cardiomyopathies: Secondary | ICD-10-CM | POA: Diagnosis not present

## 2021-06-22 NOTE — Patient Instructions (Signed)
Medication Instructions:  Your physician recommends that you continue on your current medications as directed. Please refer to the Current Medication list given to you today.  Labwork: None ordered.  Testing/Procedures: None ordered.  Follow-Up: Your physician wants you to follow-up in: one year with [name], MD or one of the following Advanced Practice Providers on your designated Care Team:   Tommye Standard, [place] Legrand Como "Jonni Sanger" Chalmers Cater, [place]  Remote monitoring is used to monitor your ICD from home. This monitoring reduces the number of office visits required to check your device to one time per year. It allows [place] to keep an eye on the functioning of your device to ensure it is working properly. You are scheduled for a device check from home on 07/09/2021. You may send your transmission at any time that day. If you have a wireless device, the transmission will be sent automatically. After your physician reviews your transmission, you will receive a postcard with your next transmission date.  Any Other Special Instructions Will Be Listed Below (If Applicable).  If you need a refill on your cardiac medications before your next appointment, please call your pharmacy.   Important Information About Sugar

## 2021-06-22 NOTE — Progress Notes (Signed)
HPI Philip Chavez returns today for followup. He  is a pleasant 71 yo man with a h/o chronic systolic heart failure, CHB, s/p PPM s/p biv ICD upgrade with elevated LV pacing thresholds. He has felt well with DDD pacing but not biv pacing. He has maintained NSR on dofetilide, amazingly. He feels well and denies chest pain or sob. He has not had any palpitations. He has class 1 CHF symptoms. No edema. No PND and no orthopnea.  No Known Allergies   Current Outpatient Medications  Medication Sig Dispense Refill   apixaban (ELIQUIS) 5 MG TABS tablet Take 1 tablet (5 mg total) by mouth 2 (two) times daily. 60 tablet 0   dapagliflozin propanediol (FARXIGA) 10 MG TABS tablet Take 1 tablet by mouth daily. Absolute last refill please call [phone] to schedule follow up 30 tablet 0   diclofenac Sodium (VOLTAREN) 1 % GEL Apply 4 g to affected area 4 (four) times daily. 300 g 0   digoxin (LANOXIN) 0.125 MG tablet Take 1 tablet by mouth once daily. 90 tablet 3   dofetilide (TIKOSYN) 500 MCG capsule Take 1 capsule by mouth 2 times daily. 60 capsule 3   furosemide (LASIX) 20 MG tablet TAKE 1 TABLET (20 MG TOTAL) 2 (TWO) TIMES DAILY BY MOUTH. 180 tablet 3   hydrALAZINE (APRESOLINE) 25 MG tablet Take 1/2 tablet by mouth 3 times daily. (Patient taking differently: Take 12.5 mg by mouth in the morning and at bedtime.) 405 tablet 0   isosorbide mononitrate (IMDUR) 30 MG 24 hr tablet Take 1 tablet (30 mg total) by mouth daily. 30 tablet 6   meloxicam (MOBIC) 15 MG tablet Take 15 mg by mouth daily.     Multiple Vitamins-Minerals (CERTAVITE/ANTIOXIDANTS) TABS Take 1 tablet by mouth daily with breakfast.     oxymetazoline (AFRIN) 0.05 % nasal spray Place 1 spray into both nostrils 2 (two) times daily as needed for congestion.     potassium chloride (KLOR-CON) 10 MEQ tablet Take 1 tablet (10 mEq total) by mouth 2 (two) times daily. 60 tablet 6   sacubitril-valsartan (ENTRESTO) 24-26 MG Take 1 tablet by mouth 2  (two) times daily. 60 tablet 11   simvastatin (ZOCOR) 40 MG tablet TAKE 1 TABLET BY MOUTH EVERY DAY 90 tablet 3   spironolactone (ALDACTONE) 25 MG tablet TAKE 1 TABLET BY MOUTH ONCE A DAY 90 tablet 3   traMADol (ULTRAM) 50 MG tablet Take 1 tablet (50 mg total) by mouth every 6 (six) hours as needed. 20 tablet 0   No current facility-administered medications for this visit.     Past Medical History:  Diagnosis Date   AICD (automatic cardioverter/defibrillator) present 0919/2016   BIV    Annual physical exam 06/09/2019   Atrial flutter ([place])    a. 08/2014 presented w/ aflutter->converted on amio;  b. CHA2DS2VASc = 2 -->Xarelto.   Atrial tachycardia ([facility])    Chronic combined systolic and diastolic CHF (congestive heart failure) ([place])    a. 03/2013 Echo EF 50-55%;  b. 07/2013 Echo EF 20-25%, diff HK, Gr3 DD;  c. 08/2014 Echo: EF 15%, diff HK, mild AI/MR, mildly dil LA/RV, mod TR, PASP 55mHg.   DVT (deep venous [place]) [place]    a. 2007 RLE: S/P ankle surgery;  b. 03/2013 LLE DVT & PE-->Xarelto.   Embolism, pulmonary with infarction ([place]    a. 2007 RLE: S/P ankle surgery;  b. 03/2013 LLE DVT & PE-->Xarelto.   Hypercholesterolemia    Hypercoagulable  state [facility])    Hypertension    Musculoskeletal neck pain    Nonischemic cardiomyopathy ([place])    a. 07/2013 Echo: EF 20-25%;  b. 07/2013 Myoview: Large inferior, lateral, apical scar w/ HK, no ischemia, EF 17%;  b. 08/2014 Echo: EF 15%, diff HK.   Pneumonia ~ 04/2014; 09/02/2014   Presence of permanent cardiac pacemaker    a. 03/2013 s/p MDT ADDRL1 Adapta DC PPM, ser # [id number] H.   Sleep apnea    does not wear mask (09/02/2014)   Third degree heart block ([place])    a. 03/2013 s/p MDT ADDRL1 Adapta DC PPM, ser # [id number] H.    ROS:   All systems reviewed and negative except as noted in the HPI.   Past Surgical History:  Procedure Laterality Date   CARDIAC CATHETERIZATION N/A 10/17/2014   Procedure: Right/Left Heart Cath and Coronary Angiography;   Surgeon: Larey Dresser, MD;  Location: [place] CV LAB;  Service: Cardiovascular;  Laterality: N/A;   CARDIOVERSION N/A 07/26/2020   Procedure: CARDIOVERSION;  Surgeon: Buford Dresser, MD;  Location: [facility] ENDOSCOPY;  Service: Cardiovascular;  Laterality: N/A;   ELECTROPHYSIOLOGIC STUDY N/A 09/07/2014   Procedure: A-Flutter;  Surgeon: Evans Lance, MD;  Location: [place] CV LAB;  Service: Cardiovascular;  Laterality: N/A;   EP IMPLANTABLE DEVICE  10/24/2014   BIV   EP IMPLANTABLE DEVICE N/A 10/24/2014   Procedure: BiV ICD Upgrade;  Surgeon: Evans Lance, MD;  Location: [place] CV LAB;  Service: Cardiovascular;  Laterality: N/A;   FOOT FRACTURE SURGERY Right 2007   FRACTURE SURGERY     INGUINAL HERNIA REPAIR Right 1980's   INGUINAL HERNIA REPAIR Right 09/22/2020   Procedure: LAPAROSCOPIC RIGHT INGUINAL HERNIA REPAIR WITH MESH;  Surgeon: Ralene Ok, MD;  Location: [place];  Service: General;  Laterality: Right;   INSERT / REPLACE / REMOVE PACEMAKER     MDT ADDRL1 pacemaker implanted by Dr Lovena Le for complete heart block   PERMANENT PACEMAKER INSERTION N/A 03/15/2013   Procedure: PERMANENT PACEMAKER INSERTION;  Surgeon: Evans Lance, MD;  Location: [facility] CATH LAB;  Service: Cardiovascular;  Laterality: N/A;     Family History  Problem Relation Age of Onset   Diabetes type II Mother    Heart disease Father    Arrhythmia Father    Arrhythmia Sister    Hypertension Neg Hx    Heart attack Neg Hx    Stroke Neg Hx      Social History   Socioeconomic History   Marital status: Married    Spouse name: Luellen Pucker   Number of children: 2   Years of education: Not on file   Highest education level: Not on file  Occupational History   Not on file  Tobacco Use   Smoking status: Former    Packs/day: 1.50    Years: 10.00    Pack years: 15.00    Types: Cigarettes   Smokeless tobacco: Never   Tobacco comments:    09/02/2014  "quit smoking 20-30 yr ago"  Vaping Use    Vaping Use: Never used  Substance and Sexual Activity   Alcohol use: No   Drug use: No   Sexual activity: Yes  Other Topics Concern   Not on file  Social History Narrative   Not on file   Social Determinants of Health   Financial Resource Strain: Not on file  Food Insecurity: Not on file  Transportation Needs: Not on file  Physical Activity: Not on file  Stress: Not on file  Social Connections: Not on file  Intimate Partner Violence: Not on file     BP 98/60   Pulse 85   Ht '6\' 1"'$  (1.854 m)   Wt 202 lb (91.6 kg)   SpO2 97%   BMI 26.65 kg/m   Physical Exam:  Well appearing NAD HEENT: Unremarkable Neck:  No JVD, no thyromegally Lymphatics:  No adenopathy Back:  No CVA tenderness Lungs:  Clear HEART:  Regular rate rhythm, no murmurs, no rubs, no clicks Abd:  soft, positive bowel sounds, no organomegally, no rebound, no guarding Ext:  2 plus pulses, no edema, no cyanosis, no clubbing Skin:  No rashes no nodules Neuro:  CN II through XII intact, motor grossly intact  EKG - nsr with ventricular pacing  DEVICE  Normal device function.  See PaceArt for details.   Assess/Plan:   Persistent atrial fib - he is maintaining NSR. He will continue dofetilide.  Chronic systolic heart failure - he is much improved on dofetilide. I would not recommend a lead extraction/revision at this point.  CHB - he is asymptomatic, s/p PPM Insertion. Coags - he denies missing his meds and has had no bleeding. Preoperative eval - he may proceed with colonoscopy. He is low risk. Ok to hold eliquis for 2-3 days prior and restart when safe after colonoscopy.   Carleene Overlie Sagal [place],MD

## 2021-06-25 ENCOUNTER — Other Ambulatory Visit (HOSPITAL_COMMUNITY): Payer: Self-pay

## 2021-06-25 ENCOUNTER — Other Ambulatory Visit (HOSPITAL_COMMUNITY): Payer: Self-pay | Admitting: Cardiology

## 2021-06-25 MED ORDER — HYDRALAZINE HCL 25 MG PO TABS
40.0000 mg | ORAL_TABLET | Freq: Three times a day (TID) | ORAL | 0 refills | Status: DC
  Filled 2021-06-25: qty 135, 90d supply, fill #0
  Filled 2021-09-29: qty 135, 90d supply, fill #1

## 2021-06-25 MED ORDER — TRAMADOL HCL 50 MG PO TABS
ORAL_TABLET | ORAL | 0 refills | Status: DC
Start: 2021-06-25 — End: 2021-08-17
  Filled 2021-06-25: qty 16, 4d supply, fill #0

## 2021-06-29 ENCOUNTER — Other Ambulatory Visit: Payer: Self-pay | Admitting: Cardiology

## 2021-06-29 DIAGNOSIS — I5022 Chronic systolic (congestive) heart failure: Secondary | ICD-10-CM

## 2021-07-05 ENCOUNTER — Other Ambulatory Visit (HOSPITAL_COMMUNITY): Payer: Self-pay

## 2021-07-06 ENCOUNTER — Other Ambulatory Visit (HOSPITAL_COMMUNITY): Payer: Self-pay

## 2021-07-12 NOTE — Progress Notes (Unsigned)
No ICM remote transmission received for 07/09/2021 and next ICM transmission scheduled for 07/23/2021.

## 2021-07-16 ENCOUNTER — Other Ambulatory Visit: Payer: Self-pay | Admitting: Cardiology

## 2021-07-16 ENCOUNTER — Other Ambulatory Visit (HOSPITAL_COMMUNITY): Payer: Self-pay

## 2021-07-16 ENCOUNTER — Other Ambulatory Visit (HOSPITAL_COMMUNITY): Payer: Self-pay | Admitting: Cardiology

## 2021-07-16 MED ORDER — APIXABAN 5 MG PO TABS
5.0000 mg | ORAL_TABLET | Freq: Two times a day (BID) | ORAL | 3 refills | Status: DC
Start: 2021-07-16 — End: 2022-08-12
  Filled 2021-07-16: qty 180, 90d supply, fill #0
  Filled 2021-08-18 – 2021-10-19 (×2): qty 180, 90d supply, fill #1
  Filled 2022-01-29: qty 180, 90d supply, fill #2
  Filled 2022-01-30: qty 60, 30d supply, fill #2
  Filled 2022-03-09: qty 60, 30d supply, fill #3
  Filled 2022-04-10: qty 60, 30d supply, fill #4
  Filled 2022-05-17: qty 60, 30d supply, fill #5
  Filled 2022-07-10: qty 60, 30d supply, fill #6

## 2021-07-17 ENCOUNTER — Other Ambulatory Visit (HOSPITAL_COMMUNITY): Payer: Self-pay

## 2021-07-17 MED ORDER — PEG 3350-KCL-NA BICARB-NACL 420 G PO SOLR
ORAL | 0 refills | Status: DC
  Filled 2021-07-17: qty 4000, 1d supply, fill #0

## 2021-07-17 MED ORDER — DAPAGLIFLOZIN PROPANEDIOL 10 MG PO TABS
10.0000 mg | ORAL_TABLET | Freq: Every day | ORAL | 0 refills | Status: DC
  Filled 2021-07-17: qty 30, 30d supply, fill #0

## 2021-07-21 ENCOUNTER — Other Ambulatory Visit (HOSPITAL_COMMUNITY): Payer: Self-pay

## 2021-07-23 ENCOUNTER — Telehealth: Payer: Self-pay

## 2021-07-23 ENCOUNTER — Ambulatory Visit (INDEPENDENT_AMBULATORY_CARE_PROVIDER_SITE_OTHER): Payer: Medicare PPO

## 2021-07-23 DIAGNOSIS — Z95810 Presence of automatic (implantable) cardiac defibrillator: Secondary | ICD-10-CM | POA: Diagnosis not present

## 2021-07-23 DIAGNOSIS — I5022 Chronic systolic (congestive) heart failure: Secondary | ICD-10-CM | POA: Diagnosis not present

## 2021-07-23 NOTE — Telephone Encounter (Signed)
Spoke with patient and advised Carelink monitor is showing disconnected.  Attempted to assist in sending remote transmission but unsuccessful.  Provided [employer] number and he will call for assistance.

## 2021-07-26 ENCOUNTER — Other Ambulatory Visit: Payer: Self-pay | Admitting: Internal Medicine

## 2021-07-26 ENCOUNTER — Other Ambulatory Visit: Payer: Self-pay | Admitting: Cardiology

## 2021-07-26 NOTE — Progress Notes (Signed)
EPIC Encounter for ICM Monitoring  Patient Name: Philip Chavez is a 71 y.o. male Date: 07/26/2021 Primary Care Physican: Ezequiel Essex, MD Primary Cardiologist: Aundra Dubin Electrophysiologist: Lovena Le RV Pacing:  98.8%        02/14/2021 Weight: 203-205 lbs 05/30/2021 Weight: 199 lbs 07/26/2021 Weight: 200 lbs   Clinical Status (22-Jun-2021 to 23-Jul-2021) VT-NS (>4 beats, >162 bpm)    10 AT/AF 15 Time in AT/AF <0.1 hr/day (0.1%) Longest AT/AF 5 minutes Battery Longevity: 6 months           Spoke with patient and heart failure questions reviewed.  Pt asymptomatic for fluid accumulation.  Reports feeling well at this time and voices no complaints.    OptiVol Thoracic impedance suggesting normal fluid levels.     Prescribed: Furosemide 20 mg 1 tablet twice a day.   Labs: 05/03/2021 Creatinine 1.39, BUN 17, Potassium 4.3, Sodium 140, GFR 55 A complete set of results can be found in Results Review.   Recommendations:  No changes and encouraged to call if experiencing any fluid symptoms.   Follow-up plan: ICM clinic phone appointment on 09/03/2021.   91 day device clinic remote transmission 11/19/2021.     EP/Cardiology Office Visits:  No recall but is due 06/2022 with Dr Lovena Le for annual visit.  08/17/2021 with Dr Aundra Dubin.   Copy of ICM check sent to Dr. Lovena Le.    3 month ICM trend: 07/23/2021.    12-14 Month ICM trend:     Rosalene Billings, RN 07/26/2021 9:30 AM

## 2021-08-01 ENCOUNTER — Other Ambulatory Visit (HOSPITAL_COMMUNITY): Payer: Self-pay

## 2021-08-05 ENCOUNTER — Other Ambulatory Visit: Payer: Self-pay | Admitting: Internal Medicine

## 2021-08-06 ENCOUNTER — Other Ambulatory Visit (HOSPITAL_COMMUNITY): Payer: Self-pay

## 2021-08-06 MED ORDER — DOFETILIDE 500 MCG PO CAPS
500.0000 ug | ORAL_CAPSULE | Freq: Two times a day (BID) | ORAL | 3 refills | Status: DC
  Filled 2021-08-06: qty 60, 30d supply, fill #0
  Filled 2021-09-05: qty 60, 30d supply, fill #1
  Filled 2021-10-07: qty 60, 30d supply, fill #2
  Filled 2021-11-05: qty 60, 30d supply, fill #3

## 2021-08-15 ENCOUNTER — Ambulatory Visit (INDEPENDENT_AMBULATORY_CARE_PROVIDER_SITE_OTHER): Payer: Medicare PPO

## 2021-08-15 DIAGNOSIS — I428 Other cardiomyopathies: Secondary | ICD-10-CM | POA: Diagnosis not present

## 2021-08-15 LAB — CUP PACEART REMOTE DEVICE CHECK
Battery Remaining Longevity: 6 mo
Battery Voltage: 2.8 V
Brady Statistic AP VP Percent: 4.88 %
Brady Statistic AP VS Percent: 0.01 %
Brady Statistic AS VP Percent: 94.64 %
Brady Statistic AS VS Percent: 0.47 %
Brady Statistic RA Percent Paced: 4.78 %
Brady Statistic RV Percent Paced: 99.21 %
Date Time Interrogation Session: 20230712001604
HighPow Impedance: 58 Ohm
Implantable Lead Implant Date: 20150209
Implantable Lead Implant Date: 20160919
Implantable Lead Implant Date: 20160919
Implantable Lead Location: 753858
Implantable Lead Location: 753859
Implantable Lead Location: 753860
Implantable Lead Model: 4598
Implantable Lead Model: 5076
Implantable Lead Model: 6935
Implantable Pulse Generator Implant Date: 20160919
Lead Channel Impedance Value: 266 Ohm
Lead Channel Impedance Value: 266 Ohm
Lead Channel Impedance Value: 266 Ohm
Lead Channel Impedance Value: 323 Ohm
Lead Channel Impedance Value: 323 Ohm
Lead Channel Impedance Value: 323 Ohm
Lead Channel Impedance Value: 342 Ohm
Lead Channel Impedance Value: 399 Ohm
Lead Channel Impedance Value: 456 Ohm
Lead Channel Impedance Value: 494 Ohm
Lead Channel Impedance Value: 494 Ohm
Lead Channel Impedance Value: 494 Ohm
Lead Channel Impedance Value: 513 Ohm
Lead Channel Pacing Threshold Amplitude: 0.875 V
Lead Channel Pacing Threshold Amplitude: 0.875 V
Lead Channel Pacing Threshold Amplitude: 2.5 V
Lead Channel Pacing Threshold Pulse Width: 0.4 ms
Lead Channel Pacing Threshold Pulse Width: 0.4 ms
Lead Channel Pacing Threshold Pulse Width: 0.4 ms
Lead Channel Sensing Intrinsic Amplitude: 13.375 mV
Lead Channel Sensing Intrinsic Amplitude: 13.375 mV
Lead Channel Sensing Intrinsic Amplitude: 3.25 mV
Lead Channel Sensing Intrinsic Amplitude: 3.25 mV
Lead Channel Setting Pacing Amplitude: 1.75 V
Lead Channel Setting Pacing Amplitude: 2 V
Lead Channel Setting Pacing Pulse Width: 0.4 ms
Lead Channel Setting Sensing Sensitivity: 0.3 mV

## 2021-08-17 ENCOUNTER — Encounter (HOSPITAL_COMMUNITY): Payer: Self-pay | Admitting: Cardiology

## 2021-08-17 ENCOUNTER — Other Ambulatory Visit (HOSPITAL_COMMUNITY): Payer: Self-pay

## 2021-08-17 ENCOUNTER — Ambulatory Visit (HOSPITAL_COMMUNITY)
Admission: RE | Admit: 2021-08-17 | Discharge: 2021-08-17 | Disposition: A | Discharge status: Home / Self Care | Payer: Medicare PPO | Source: Ambulatory Visit | Attending: Cardiology | Admitting: Cardiology

## 2021-08-17 ENCOUNTER — Other Ambulatory Visit: Payer: Self-pay | Admitting: Cardiology

## 2021-08-17 VITALS — BP 90/50 | HR 81 | Wt 195.0 lb

## 2021-08-17 DIAGNOSIS — E785 Hyperlipidemia, unspecified: Secondary | ICD-10-CM | POA: Insufficient documentation

## 2021-08-17 DIAGNOSIS — I4892 Unspecified atrial flutter: Secondary | ICD-10-CM | POA: Diagnosis not present

## 2021-08-17 DIAGNOSIS — I2510 Atherosclerotic heart disease of native coronary artery without angina pectoris: Secondary | ICD-10-CM | POA: Diagnosis not present

## 2021-08-17 DIAGNOSIS — Z7984 Long term (current) use of oral hypoglycemic drugs: Secondary | ICD-10-CM | POA: Insufficient documentation

## 2021-08-17 DIAGNOSIS — G4733 Obstructive sleep apnea (adult) (pediatric): Secondary | ICD-10-CM | POA: Insufficient documentation

## 2021-08-17 DIAGNOSIS — I428 Other cardiomyopathies: Secondary | ICD-10-CM | POA: Insufficient documentation

## 2021-08-17 DIAGNOSIS — Z86718 Personal history of other venous thrombosis and embolism: Secondary | ICD-10-CM | POA: Insufficient documentation

## 2021-08-17 DIAGNOSIS — I5022 Chronic systolic (congestive) heart failure: Secondary | ICD-10-CM | POA: Insufficient documentation

## 2021-08-17 DIAGNOSIS — N189 Chronic kidney disease, unspecified: Secondary | ICD-10-CM | POA: Diagnosis not present

## 2021-08-17 DIAGNOSIS — Z950 Presence of cardiac pacemaker: Secondary | ICD-10-CM | POA: Insufficient documentation

## 2021-08-17 DIAGNOSIS — Z79899 Other long term (current) drug therapy: Secondary | ICD-10-CM | POA: Insufficient documentation

## 2021-08-17 DIAGNOSIS — Z8249 Family history of ischemic heart disease and other diseases of the circulatory system: Secondary | ICD-10-CM | POA: Diagnosis not present

## 2021-08-17 DIAGNOSIS — I4891 Unspecified atrial fibrillation: Secondary | ICD-10-CM | POA: Diagnosis not present

## 2021-08-17 DIAGNOSIS — I442 Atrioventricular block, complete: Secondary | ICD-10-CM | POA: Insufficient documentation

## 2021-08-17 DIAGNOSIS — Z7901 Long term (current) use of anticoagulants: Secondary | ICD-10-CM | POA: Insufficient documentation

## 2021-08-17 LAB — DIGOXIN LEVEL: Digoxin Level: 1.1 ng/mL (ref 0.8–2.0)

## 2021-08-17 LAB — BASIC METABOLIC PANEL
Anion gap: 10 (ref 5–15)
BUN: 15 mg/dL (ref 8–23)
CO2: 24 mmol/L (ref 22–32)
Calcium: 9.1 mg/dL (ref 8.9–10.3)
Chloride: 106 mmol/L (ref 98–111)
Creatinine, Ser: 1.48 mg/dL — ABNORMAL HIGH (ref 0.61–1.24)
GFR, Estimated: 51 mL/min — ABNORMAL LOW (ref >60)
Glucose, Bld: 102 mg/dL — ABNORMAL HIGH (ref 70–99)
Potassium: 4.2 mmol/L (ref 3.5–5.1)
Sodium: 140 mmol/L (ref 135–145)

## 2021-08-17 LAB — CBC
HCT: 42.6 % (ref 39.0–52.0)
Hemoglobin: 14 g/dL (ref 13.0–17.0)
MCH: 30.6 pg (ref 26.0–34.0)
MCHC: 32.9 g/dL (ref 30.0–36.0)
MCV: 93.2 fL (ref 80.0–100.0)
Platelets: 181 10*3/uL (ref 150–400)
RBC: 4.57 MIL/uL (ref 4.22–5.81)
RDW: 14.3 % (ref 11.5–15.5)
WBC: 4.1 10*3/uL (ref 4.0–10.5)
nRBC: 0 % (ref 0.0–0.2)

## 2021-08-17 LAB — MAGNESIUM: Magnesium: 2.2 mg/dL (ref 1.7–2.4)

## 2021-08-17 MED ORDER — DAPAGLIFLOZIN PROPANEDIOL 10 MG PO TABS
10.0000 mg | ORAL_TABLET | Freq: Every day | ORAL | 11 refills | Status: DC
  Filled 2021-08-17: qty 30, 30d supply, fill #0
  Filled 2021-09-17: qty 30, 30d supply, fill #1
  Filled 2021-10-16: qty 30, 30d supply, fill #2
  Filled 2021-11-17: qty 30, 30d supply, fill #3

## 2021-08-17 NOTE — Patient Instructions (Signed)
Medication Changes:  None, continue current medications  Lab Work:  Labs done today, your results will be available in MyChart, we will contact you for abnormal readings.  Testing/Procedures:  Your physician has requested that you have an echocardiogram. Echocardiography is a painless test that uses sound waves to create images of your heart. It provides your doctor with information about the size and shape of your heart and how well your heart's chambers and valves are working. This procedure takes approximately one hour. There are no restrictions for this procedure.  Referrals:  none  Special Instructions // Education:  Do the following things EVERYDAY: Weigh yourself in the morning before breakfast. Write it down and keep it in a log. Take your medicines as prescribed Eat low salt foods--Limit salt (sodium) to 2000 mg per day.  Stay as active as you can everyday Limit all fluids for the day to less than 2 liters   Follow-Up in: 3 months  At the [facility], you and your health needs are our priority. We have a designated team specialized in the treatment of Heart Failure. This Care Team includes your primary Heart Failure Specialized Cardiologist (physician), Advanced Practice Providers (APPs- Physician Assistants and Nurse Practitioners), and Pharmacist who all work together to provide you with the care you need, when you need it.   You may see any of the following providers on your designated Care Team at your next follow up:  Dr Glori Bickers Dr Haynes Kerns, NP Lyda Jester, [place] [facility] [place], [place] Audry Riles, PharmD   Please be sure to bring in all your medications bottles to every appointment.   Need to Contact us:  If you have any questions or concerns before your next appointment please send [place] a message through [place] or call our office at [phone].    TO LEAVE A MESSAGE FOR THE NURSE SELECT OPTION  2, PLEASE LEAVE A MESSAGE INCLUDING: YOUR NAME DATE OF BIRTH CALL BACK NUMBER REASON FOR CALL**this is important as we prioritize the call backs  YOU WILL RECEIVE A CALL BACK THE SAME DAY AS LONG AS YOU CALL BEFORE 4:00 PM

## 2021-08-18 ENCOUNTER — Other Ambulatory Visit (HOSPITAL_COMMUNITY): Payer: Self-pay

## 2021-08-18 NOTE — Progress Notes (Addendum)
Patient ID: Philip Chavez, male   DOB: 27-Feb-1950, 71 y.o.   MRN: [id number] PCP: Philip Essex, MD EP: Dr. Lovena Chavez HF cardiologist: Dr Philip Chavez  71 y.o. with history of complete heart block/Medtronic PPM, cardiomyopathy, and atrial flutter s/p ablation presents for CHF clinic evaluation.  Patient developed complete heart block in 2/15 and had PPM placed at that time.  He paces his RV continuously.  In 2/15, EF was 50-55% by echo.  By 6/15, EF had fallen to 20-25%.  Cardiolite showed possible scar but no ischemia.  On 09/03/14, he was admitted with dyspnea and found to be in atrial flutter with RLL PNA and with volume overload.  Echo showed EF 15% with diffuse hypokinesis.  He was started on IV Lasix and IV amiodarone. He converted back to NSR.  He subsequently had atrial flutter ablation.  He had right and left heart cath in 9/16, showing nonobstructive CAD and optimized filling pressures with CI 2.09.  He had upgrade to CRT by Dr Philip Chavez.  EF up to 30-35% in 4/17.  Subsequently, the LV lead dislodged.  He was offered lead revision, but preferred watchful waiting. Echo in 5/18 showed EF down to 20%.    Echo in 10/20 showed EF < 20%, normal RV.  Echo 12/21 showed EF <20%, moderate LV dilation with mild LVH, mildly decreased RV systolic function.   Patient was started on Tikosyn for atrial fibrillation and converted to NSR.    Philip Chavez returns for followup of CHF today. He is in NSR today.  He continues to RV pace, LV lead has not been revised given minimal symptoms.  No significant exertional dyspnea.  No chest pain. He stays quite active, walks a lot.  No lightheadedness.  He has not felt palpitations.  Weight is down 13 lbs since last appointment in this office.   ECG (personally reviewed): NSR, V-paced, QTc 611 msec  MDT device: 99% RV paced, stable thoracic impedance.    Labs (8/16): K 3.9, creatinine 1.03, AST 106, ALT 173, HCT 45.1, LDL 132 Labs (9/16): K 3.9, creatinine 1.16, HCT 42.5 Labs  (10/16): K 3.9, creatinine 1.32, HCT 39.9 Labs (2/17): K 4.1, creatinine 1.26, BNP 107, digoxin 0.3 Labs (07/10/2015): K 3.9 Creatinine 1.23  Labs (1/18): K 4.1, creatinine 1.18, digoxin 0.3 Labs (2/18): K 4, creatinine 1.24, digoxin 0.4 Labs (5/18): K 4, creatinine 1.25, hgb 14.8, digoxin 0.5 Labs (10/18): K 4, creatinine 1.24, digoxin 0.2 Labs (4/19): K 3.8, creatinine 1.22, digoxin 0.5, LDL 69, HDL 40 Labs (8/19): K 3.9, creatinine 1.35, digoxin 0.3 Labs (12/19): K 4, creatinine 1.22 Labs (1/21): K 4.2, creatinine 1.34, LDL 121 Labs (4/21): digoxin level 0.7, K 3.8, creatinine 1.28 Labs (9/21): digoxin 0.2, hgb 15, K 3.8, creatinine 1.22 Labs (12/21): digoxin 0.5 Labs (1/22): K 3.8, creatinine 1.33 Labs (3/23): K 4.3, creatinine 1.39  PMH: 1. H/o complete heart block: Has Medtronic PPM, placed in 2/15.   2. Cardiomyopathy: Echo (2/15) prior to PPM placement with EF 50-55%.  Echo (6/15) with EF 20-25%,  Cardiolite at that time showed possible scar but no ischemia.  Echo (8/16) with EF 15%, diffuse hypokinesis, mildly dilated RV with normal systolic function, PA systolic pressure 69 mmHg.  LHC/RHC (9/16) with mean RA 4, PA 31/15 mean 20, mean PCWP 8, CI 2.09, 40% mLAD stenosis.  Upgrade to MDT CRT-D in 9/16.  - Echo (4/17) with EF 30-35%.   - LV lead dislodged and loss of BiV pacing.  - Echo (5/18) with  EF 20%, mild LV dilation, normal RV size with mildly decreased systolic function, mild AI.  - Echo (10/20): EF < 20%, RV normal, mild Philip.  - Echo (12/21): EF <20%, moderate LV dilation with mild LVH, mildly decreased RV systolic function. 3. CKD 4. 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  5. Atrial tachycardia: paroxysmal. 6. Atrial flutter: s/p ablation in 8/16.  7. OSA 8. Hyperlipidemia 9. Shingles with post-herpetic neuralgia.  10. Atrial fibrillation: Paroxysmal.   FH: Father with CHF, complete heart block with PPM.  Sister with complete heart block and PPM.   SH: Retired  Curator, nonsmoker, lives in [place]  ROS: All systems reviewed and negative except as per HPI  Current Outpatient Medications  Medication Sig Dispense Refill   apixaban (ELIQUIS) 5 MG TABS tablet Take 1 tablet (5 mg total) by mouth 2 (two) times daily. 180 tablet 3   digoxin (LANOXIN) 0.125 MG tablet Take 1 tablet by mouth once daily. 90 tablet 3   dofetilide (TIKOSYN) 500 MCG capsule Take 1 capsule by mouth 2 times daily. 60 capsule 3   furosemide (LASIX) 20 MG tablet TAKE 1 TABLET (20 MG TOTAL) 2 (TWO) TIMES DAILY BY MOUTH. 180 tablet 3   hydrALAZINE (APRESOLINE) 25 MG tablet Take 1/2 tablet by mouth 3 times daily. 405 tablet 0   isosorbide mononitrate (IMDUR) 30 MG 24 hr tablet Take 1 tablet (30 mg total) by mouth daily. 30 tablet 6   meloxicam (MOBIC) 15 MG tablet Take 15 mg by mouth daily.     Multiple Vitamins-Minerals (CERTAVITE/ANTIOXIDANTS) TABS Take 1 tablet by mouth daily with breakfast.     oxymetazoline (AFRIN) 0.05 % nasal spray Place 1 spray into both nostrils 2 (two) times daily as needed for congestion.     potassium chloride (KLOR-CON) 10 MEQ tablet TAKE 1 TABLET BY MOUTH 2 TIMES DAILY. 180 tablet 2   sacubitril-valsartan (ENTRESTO) 24-26 MG Take 1 tablet by mouth 2 (two) times daily. 60 tablet 11   simvastatin (ZOCOR) 40 MG tablet TAKE 1 TABLET BY MOUTH EVERY DAY 90 tablet 3   spironolactone (ALDACTONE) 25 MG tablet TAKE 1 TABLET BY MOUTH ONCE A DAY 90 tablet 3   traMADol (ULTRAM) 50 MG tablet Take 1 tablet (50 mg total) by mouth every 6 (six) hours as needed. 20 tablet 0   dapagliflozin propanediol (FARXIGA) 10 MG TABS tablet Take 1 tablet by mouth daily. Absolute last refill please call [phone] to schedule follow up 30 tablet 11   polyethylene glycol-electrolytes (NULYTELY) 420 g solution Use as directed (Patient not taking: Reported on 08/17/2021) 4000 mL 0   No current facility-administered medications for this encounter.   BP (!) 90/50   Pulse 81   Wt 88.5  kg (195 lb)   SpO2 98%   BMI 25.73 kg/m  General: NAD Neck: No JVD, no thyromegaly or thyroid nodule.  Lungs: Clear to auscultation bilaterally with normal respiratory effort. CV: Nondisplaced PMI.  Heart regular S1/S2, no S3/S4, no murmur.  No peripheral edema.  No carotid bruit.  Normal pedal pulses.  Abdomen: Soft, nontender, no hepatosplenomegaly, no distention.  Skin: Intact without lesions or rashes.  Neurologic: Alert and oriented x 3.  Psych: Normal affect. Extremities: No clubbing or cyanosis.  HEENT: Normal.   Assessment/Plan: 1. Chronic systolic CHF: Nonischemic cardiomyopathy.  EF 15% on echo in 8/16.  Patient had borderline reduced EF when PPM was placed in 2/15.  After that, EF fell significantly.  He has a  history of complete heart block and father had CHF and CHB with pacemaker, and sounds like sister also had CHB with pacemaker. 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  He had CRT-D upgrade.  No significant CAD on last cath.  Echo in 4/17 showed improvement in EF to 30-35%.  However, his LV lead became dislodged and he is no longer BiV pacing, now RV pacing > 99% of the time.  Echo in 5/18 showed EF down to 20%.  Echo in 12/21 shows EF < 20% and moderately dilated. Symptomatically, he is still doing well (NYHA class I-II) with no volume overload on exam or by Optivol. He feels better now that he is back in NSR on Tikosyn.  However, I am concerned about his long-term prognosis as his LV is very dysfunctional and dilated.  Given minimal symptoms, decision was made not to revise his LV lead.  - He is off bisoprolol, I am not sure why.  He will need to restart this in the future.  Will not start today with soft BP.  - Continue  Entresto 24/26 bid, spironolactone 25 daily, Lasix 20 bid, hydralazine 12.5 mg tid and Imdur 30 daily.  BMET today.   - Continue digoxin. Check level.  - Continue Farxiga 10 mg daily.  - We have discussed genetic  evaluation to look for LMNA or SCN5A mutations that could be used for familial screening, he has wanted to hold off.  - I will arrange for repeat echo.  2. Atrial flutter: s/p ablation.  3. Atrial fibrillation: He is in NSR on Tikosyn. He has done well with Tikosyn.  His paced QTc is prolonged.  I discussed this with Dr. Lovena Chavez, and given the presence of an ICD, we decided to keep his Tikosyn at the current dose.  - Continue Eliquis.  - Check BMET, Mg and continue current Tikosyn.  3. PE/DVT: remote and associated with surgery.   4. Hyperlipidemia: He has mild CAD.   - No ASA with Eliquis.   - Continue simvastatin 40 mg daily.   5. Complete heart block: Now RV pacing with loss of LV lead, see discussion above.   Followup in 3 months with APP .  Loralie Champagne 08/18/2021

## 2021-08-20 ENCOUNTER — Telehealth (HOSPITAL_COMMUNITY): Payer: Self-pay

## 2021-08-20 ENCOUNTER — Other Ambulatory Visit (HOSPITAL_COMMUNITY): Payer: Self-pay

## 2021-08-20 DIAGNOSIS — I5022 Chronic systolic (congestive) heart failure: Secondary | ICD-10-CM

## 2021-08-20 NOTE — Telephone Encounter (Signed)
Patient advised and verbalized understanding,lab appointment scheduled,lab orders entered  Orders Placed This Encounter  Procedures   Digoxin level    Standing Status:   Future    Standing Expiration Date:   08/21/2022    Order Specific Question:   Release to patient    Answer:   Immediate

## 2021-08-20 NOTE — Telephone Encounter (Signed)
-----   Message from Larey Dresser, MD sent at 08/17/2021  4:17 PM EDT ----- Digoxin level mildly above goal, please repeat digoxin level as trough (draw before am dose is given

## 2021-08-22 ENCOUNTER — Ambulatory Visit (HOSPITAL_COMMUNITY)
Admission: RE | Admit: 2021-08-22 | Discharge: 2021-08-22 | Disposition: A | Discharge status: Home / Self Care | Payer: Medicare PPO | Source: Ambulatory Visit | Attending: Internal Medicine | Admitting: Internal Medicine

## 2021-08-22 ENCOUNTER — Other Ambulatory Visit (HOSPITAL_COMMUNITY): Payer: Self-pay

## 2021-08-22 DIAGNOSIS — I5022 Chronic systolic (congestive) heart failure: Secondary | ICD-10-CM | POA: Insufficient documentation

## 2021-08-22 LAB — DIGOXIN LEVEL: Digoxin Level: 0.4 ng/mL — ABNORMAL LOW (ref 0.8–2.0)

## 2021-08-23 ENCOUNTER — Other Ambulatory Visit (HOSPITAL_COMMUNITY): Payer: Self-pay

## 2021-08-30 ENCOUNTER — Ambulatory Visit: Payer: Self-pay | Admitting: Licensed Clinical Social Worker

## 2021-08-30 NOTE — Patient Outreach (Signed)
  Care Coordination   Initial Visit Note   08/30/2021 Name: Philip Chavez MRN: [id number] DOB: Jun 30, 1950  Philip Chavez is a 71 y.o. year old male who sees Ezequiel Essex, MD for primary care. I spoke with  [name] by phone today  What matters to the patients health and wellness today?   Patient reports no concerns or needs with health and wellness related to physical or mental heath.  Currently receiving Health support via [place] [employer] he know how to connect for services if he needs them   Goals Addressed   None     SDOH assessments and interventions completed:   Yes SDOH Interventions Today    Flowsheet Row Most Recent Value  SDOH Interventions   Food Insecurity Interventions Intervention Not Indicated  Housing Interventions Intervention Not Indicated  Transportation Interventions Intervention Not Indicated      Care Coordination Interventions Activated:  No Care Coordination Interventions:  No, not indicated  Follow up plan: No further intervention required.  Encounter Outcome:  Pt. Visit Completed  Casimer Lanius, [place] [phone]

## 2021-08-30 NOTE — Patient Instructions (Signed)
Visit Information  Thank you for taking time to visit with me today. Please don't hesitate to contact me if I can be of assistance to you.   No needs were identified during our call today:   Goals Addressed   None    Please call the care guide team at [phone] if you need to schedule an appointment with me.   If you are experiencing a Mental Health or Advance or need someone to talk to, please call the Suicide and Crisis Lifeline: 988 call 1-800-273-TALK (toll free, 24 hour hotline) go to [facility] [place] [phone])   The patient verbalized understanding of instructions, educational materials, and care plan provided today and DECLINED offer to receive copy of patient instructions, educational materials, and care plan.   No further follow up required: you do not desire continued follow up  Casimer Lanius, [place] [phone]

## 2021-09-03 ENCOUNTER — Ambulatory Visit (INDEPENDENT_AMBULATORY_CARE_PROVIDER_SITE_OTHER): Payer: Medicare PPO

## 2021-09-03 ENCOUNTER — Ambulatory Visit (HOSPITAL_COMMUNITY)
Admission: RE | Admit: 2021-09-03 | Discharge: 2021-09-03 | Disposition: A | Discharge status: Home / Self Care | Payer: Medicare PPO | Source: Ambulatory Visit | Attending: Family Medicine | Admitting: Family Medicine

## 2021-09-03 DIAGNOSIS — Z95810 Presence of automatic (implantable) cardiac defibrillator: Secondary | ICD-10-CM

## 2021-09-03 DIAGNOSIS — I5022 Chronic systolic (congestive) heart failure: Secondary | ICD-10-CM

## 2021-09-03 DIAGNOSIS — I4892 Unspecified atrial flutter: Secondary | ICD-10-CM | POA: Insufficient documentation

## 2021-09-03 DIAGNOSIS — I080 Rheumatic disorders of both mitral and aortic valves: Secondary | ICD-10-CM | POA: Insufficient documentation

## 2021-09-03 DIAGNOSIS — I110 Hypertensive heart disease with heart failure: Secondary | ICD-10-CM | POA: Insufficient documentation

## 2021-09-03 LAB — ECHOCARDIOGRAM COMPLETE
Area-P 1/2: 5.66 cm2
Calc EF: 22.8 %
MV M vel: 3.84 m/s
MV Peak grad: 59 mmHg
P 1/2 time: 627 msec
Radius: 0.6 cm
S' Lateral: 6.9 cm
Single Plane A2C EF: 24.8 %
Single Plane A4C EF: 19.1 %

## 2021-09-03 NOTE — Progress Notes (Signed)
Remote ICD transmission.   

## 2021-09-05 ENCOUNTER — Other Ambulatory Visit (HOSPITAL_COMMUNITY): Payer: Self-pay

## 2021-09-05 NOTE — Progress Notes (Signed)
EPIC Encounter for ICM Monitoring  Patient Name: Philip Chavez is a 71 y.o. male Date: 09/05/2021 Primary Care Physican: Ezequiel Essex, MD Primary Cardiologist: Aundra Dubin Electrophysiologist: Lovena Le RV Pacing:  99.3%        07/26/2021 Weight: 200 lbs 09/05/2021 Weight: 200 lbs   Clinical Status (17-Aug-2021 to 03-Sep-2021) VT-NS (>4 beats, >162 bpm)    3 AT/AF  4 Time in AT/AF  <0.1 hr/day (0.1%) Longest AT/AF 93 seconds Battery Longevity: 5 months           Spoke with patient and heart failure questions reviewed.  Pt asymptomatic for fluid accumulation.  Reports feeling well at this time and voices no complaints.    OptiVol Thoracic impedance suggesting normal fluid levels.     Prescribed: Furosemide 20 mg 1 tablet twice a day.   Labs: 05/03/2021 Creatinine 1.39, BUN 17, Potassium 4.3, Sodium 140, GFR 55 A complete set of results can be found in Results Review.   Recommendations:  No changes and encouraged to call if experiencing any fluid symptoms.   Follow-up plan: ICM clinic phone appointment on 10/09/2021.   91 day device clinic remote transmission 11/19/2021.     EP/Cardiology Office Visits:  No recall but is due 06/2022 with Dr Lovena Le for annual visit.  08/17/2021 with Dr Aundra Dubin.   Copy of ICM check sent to Dr. Lovena Le.    3 month ICM trend: 09/03/2021.    12-14 Month ICM trend:     Rosalene Billings, RN 09/05/2021 2:56 PM

## 2021-09-06 ENCOUNTER — Other Ambulatory Visit (HOSPITAL_COMMUNITY): Payer: Self-pay

## 2021-09-06 ENCOUNTER — Other Ambulatory Visit: Payer: Self-pay | Admitting: Gastroenterology

## 2021-09-17 ENCOUNTER — Other Ambulatory Visit (HOSPITAL_COMMUNITY): Payer: Self-pay

## 2021-09-17 ENCOUNTER — Ambulatory Visit: Payer: Medicare PPO

## 2021-09-17 DIAGNOSIS — I442 Atrioventricular block, complete: Secondary | ICD-10-CM

## 2021-09-17 LAB — CUP PACEART REMOTE DEVICE CHECK
Battery Remaining Longevity: 5 mo
Battery Voltage: 2.81 V
Brady Statistic AP VP Percent: 5.63 %
Brady Statistic AP VS Percent: 0.02 %
Brady Statistic AS VP Percent: 93.92 %
Brady Statistic AS VS Percent: 0.44 %
Brady Statistic RA Percent Paced: 5.5 %
Brady Statistic RV Percent Paced: 99.14 %
Date Time Interrogation Session: 20230814022825
HighPow Impedance: 60 Ohm
Implantable Lead Implant Date: 20150209
Implantable Lead Implant Date: 20160919
Implantable Lead Implant Date: 20160919
Implantable Lead Location: 753858
Implantable Lead Location: 753859
Implantable Lead Location: 753860
Implantable Lead Model: 4598
Implantable Lead Model: 5076
Implantable Lead Model: 6935
Implantable Pulse Generator Implant Date: 20160919
Lead Channel Impedance Value: 266 Ohm
Lead Channel Impedance Value: 304 Ohm
Lead Channel Impedance Value: 304 Ohm
Lead Channel Impedance Value: 323 Ohm
Lead Channel Impedance Value: 323 Ohm
Lead Channel Impedance Value: 342 Ohm
Lead Channel Impedance Value: 399 Ohm
Lead Channel Impedance Value: 399 Ohm
Lead Channel Impedance Value: 494 Ohm
Lead Channel Impedance Value: 494 Ohm
Lead Channel Impedance Value: 494 Ohm
Lead Channel Impedance Value: 513 Ohm
Lead Channel Impedance Value: 532 Ohm
Lead Channel Pacing Threshold Amplitude: 0.625 V
Lead Channel Pacing Threshold Amplitude: 0.875 V
Lead Channel Pacing Threshold Amplitude: 2.5 V
Lead Channel Pacing Threshold Pulse Width: 0.4 ms
Lead Channel Pacing Threshold Pulse Width: 0.4 ms
Lead Channel Pacing Threshold Pulse Width: 0.4 ms
Lead Channel Sensing Intrinsic Amplitude: 11.625 mV
Lead Channel Sensing Intrinsic Amplitude: 11.625 mV
Lead Channel Sensing Intrinsic Amplitude: 3.125 mV
Lead Channel Sensing Intrinsic Amplitude: 3.125 mV
Lead Channel Setting Pacing Amplitude: 1.5 V
Lead Channel Setting Pacing Amplitude: 2 V
Lead Channel Setting Pacing Pulse Width: 0.4 ms
Lead Channel Setting Sensing Sensitivity: 0.3 mV

## 2021-09-21 ENCOUNTER — Other Ambulatory Visit (HOSPITAL_COMMUNITY): Payer: Self-pay

## 2021-09-29 ENCOUNTER — Other Ambulatory Visit (HOSPITAL_COMMUNITY): Payer: Self-pay

## 2021-10-08 ENCOUNTER — Other Ambulatory Visit (HOSPITAL_COMMUNITY): Payer: Self-pay

## 2021-10-09 ENCOUNTER — Other Ambulatory Visit (HOSPITAL_COMMUNITY): Payer: Self-pay

## 2021-10-09 ENCOUNTER — Ambulatory Visit (INDEPENDENT_AMBULATORY_CARE_PROVIDER_SITE_OTHER): Payer: Medicare PPO

## 2021-10-09 DIAGNOSIS — Z95810 Presence of automatic (implantable) cardiac defibrillator: Secondary | ICD-10-CM

## 2021-10-09 DIAGNOSIS — I5022 Chronic systolic (congestive) heart failure: Secondary | ICD-10-CM

## 2021-10-12 NOTE — Progress Notes (Signed)
EPIC Encounter for ICM Monitoring  Patient Name: Philip Chavez is a 71 y.o. male Date: 10/12/2021 Primary Care Physican: Ezequiel Essex, MD Primary Cardiologist: Aundra Dubin Electrophysiologist: Lovena Le RV Pacing:  98.6%        07/26/2021 Weight: 200 lbs 09/05/2021 Weight: 200 lbs 10/12/2021 Weight: 200 ;bs   Clinical Status (17-Sep-2021 to 09-Oct-2021) VT-NS (>4 beats, >162 bpm)    9 AT/AF  12 Time in AT/AF  <0.1 hr/day (0.1%) Longest AT/AF 7 minutes  Battery Longevity: 4 months           Spoke with patient and heart failure questions reviewed.  Pt asymptomatic for fluid accumulation.  Reports feeling well at this time and voices no complaints.    OptiVol Thoracic impedance suggesting normal fluid levels.     Prescribed:  Furosemide 20 mg 1 tablet by mouth twice a day. Potassium 10 mEq take 1 tablet by mouth twice a day Spironolactone 25 mg take 1 tablet by mouth once a day   Labs: 08/17/2021 Creatinine 1.48, BUN 15, Potassium 4.2, Sodium 140, GFR 51 05/03/2021 Creatinine 1.39, BUN 17, Potassium 4.3, Sodium 140, GFR 55 A complete set of results can be found in Results Review.   Recommendations:  No changes and encouraged to call if experiencing any fluid symptoms.   Follow-up plan: ICM clinic phone appointment on 11/20/2021.   91 day device clinic remote transmission 11/19/2021.     EP/Cardiology Office Visits:  No recall but is due 06/2022 with Dr Lovena Le for annual visit.  11/16/2021 with HF clinic.   Copy of ICM check sent to Dr. Lovena Le.    3 month ICM trend: 10/09/2021.    12-14 Month ICM trend:     Rosalene Billings, RN 10/12/2021 7:34 AM

## 2021-10-16 ENCOUNTER — Other Ambulatory Visit (HOSPITAL_COMMUNITY): Payer: Self-pay

## 2021-10-18 ENCOUNTER — Ambulatory Visit (INDEPENDENT_AMBULATORY_CARE_PROVIDER_SITE_OTHER): Payer: Medicare PPO

## 2021-10-18 DIAGNOSIS — I428 Other cardiomyopathies: Secondary | ICD-10-CM

## 2021-10-18 NOTE — Progress Notes (Signed)
Remote ICD transmission.   

## 2021-10-19 ENCOUNTER — Other Ambulatory Visit (HOSPITAL_COMMUNITY): Payer: Self-pay

## 2021-10-19 LAB — CUP PACEART REMOTE DEVICE CHECK
Battery Remaining Longevity: 4 mo
Battery Voltage: 2.8 V
Brady Statistic AP VP Percent: 9.64 %
Brady Statistic AP VS Percent: 0.01 %
Brady Statistic AS VP Percent: 89.68 %
Brady Statistic AS VS Percent: 0.67 %
Brady Statistic RA Percent Paced: 9.25 %
Brady Statistic RV Percent Paced: 98.29 %
Date Time Interrogation Session: 20230914072405
HighPow Impedance: 58 Ohm
Implantable Lead Implant Date: 20150209
Implantable Lead Implant Date: 20160919
Implantable Lead Implant Date: 20160919
Implantable Lead Location: 753858
Implantable Lead Location: 753859
Implantable Lead Location: 753860
Implantable Lead Model: 4598
Implantable Lead Model: 5076
Implantable Lead Model: 6935
Implantable Pulse Generator Implant Date: 20160919
Lead Channel Impedance Value: 266 Ohm
Lead Channel Impedance Value: 304 Ohm
Lead Channel Impedance Value: 304 Ohm
Lead Channel Impedance Value: 304 Ohm
Lead Channel Impedance Value: 304 Ohm
Lead Channel Impedance Value: 323 Ohm
Lead Channel Impedance Value: 342 Ohm
Lead Channel Impedance Value: 399 Ohm
Lead Channel Impedance Value: 437 Ohm
Lead Channel Impedance Value: 456 Ohm
Lead Channel Impedance Value: 494 Ohm
Lead Channel Impedance Value: 494 Ohm
Lead Channel Impedance Value: 494 Ohm
Lead Channel Pacing Threshold Amplitude: 0.75 V
Lead Channel Pacing Threshold Amplitude: 0.875 V
Lead Channel Pacing Threshold Amplitude: 2.5 V
Lead Channel Pacing Threshold Pulse Width: 0.4 ms
Lead Channel Pacing Threshold Pulse Width: 0.4 ms
Lead Channel Pacing Threshold Pulse Width: 0.4 ms
Lead Channel Sensing Intrinsic Amplitude: 11.875 mV
Lead Channel Sensing Intrinsic Amplitude: 11.875 mV
Lead Channel Sensing Intrinsic Amplitude: 2.25 mV
Lead Channel Sensing Intrinsic Amplitude: 2.25 mV
Lead Channel Setting Pacing Amplitude: 1.75 V
Lead Channel Setting Pacing Amplitude: 2 V
Lead Channel Setting Pacing Pulse Width: 0.4 ms
Lead Channel Setting Sensing Sensitivity: 0.3 mV

## 2021-10-22 ENCOUNTER — Other Ambulatory Visit: Payer: Self-pay | Admitting: Internal Medicine

## 2021-10-22 ENCOUNTER — Other Ambulatory Visit (HOSPITAL_COMMUNITY): Payer: Self-pay

## 2021-10-22 MED ORDER — ISOSORBIDE MONONITRATE ER 30 MG PO TB24
30.0000 mg | ORAL_TABLET | Freq: Every day | ORAL | 7 refills | Status: DC
  Filled 2021-10-22: qty 30, 30d supply, fill #0
  Filled 2021-11-17: qty 30, 30d supply, fill #1

## 2021-10-23 ENCOUNTER — Other Ambulatory Visit (HOSPITAL_COMMUNITY): Payer: Self-pay

## 2021-10-26 ENCOUNTER — Telehealth (INDEPENDENT_AMBULATORY_CARE_PROVIDER_SITE_OTHER): Payer: Medicare PPO

## 2021-10-26 DIAGNOSIS — I428 Other cardiomyopathies: Secondary | ICD-10-CM

## 2021-10-26 DIAGNOSIS — I5022 Chronic systolic (congestive) heart failure: Secondary | ICD-10-CM

## 2021-10-26 NOTE — Telephone Encounter (Signed)
Device alert for sustained VT with successful HV therapy. Event occurred 9/22 @ 10:29, EGM shows sustained VT, rate mean 222 falling in to the VF zone, arrhythmia converted with 30J Additional 6 NSVT, no therapy. Presenting AF, controlled rates, burden 0.7%, Eliquis. Route to triage per protocol.  Patient reports he was walking down a flight of steps and when he got to the bottom he passed out. Patient reports he was already on flat ground prior to passing out. Denies any injury and or hitting his head. Patient denies any symptoms pre or post loc. Reports compliance with medications. Patient is UTD on in-clinic check and is on monthly battery checks. Estimated 4 months until ERI. Reviewed no driving per [place] DMV x6 months and shock plan. Advised I will forward to Dr. Lovena Le for review and will call with any questions or concerns. Voiced understanding.

## 2021-10-29 ENCOUNTER — Other Ambulatory Visit (HOSPITAL_COMMUNITY): Payer: Self-pay

## 2021-10-29 ENCOUNTER — Other Ambulatory Visit (HOSPITAL_COMMUNITY): Payer: Self-pay | Admitting: Cardiology

## 2021-10-29 MED ORDER — SPIRONOLACTONE 25 MG PO TABS
ORAL_TABLET | Freq: Every day | ORAL | 3 refills | Status: DC
  Filled 2021-10-29: qty 90, 90d supply, fill #0
  Filled 2022-02-01: qty 90, 90d supply, fill #1
  Filled 2022-05-05: qty 90, 90d supply, fill #2
  Filled 2022-08-01: qty 90, 90d supply, fill #3

## 2021-10-30 ENCOUNTER — Other Ambulatory Visit: Payer: Medicare PPO

## 2021-10-30 ENCOUNTER — Other Ambulatory Visit (HOSPITAL_COMMUNITY): Payer: Self-pay

## 2021-10-30 ENCOUNTER — Ambulatory Visit: Payer: Medicare PPO | Attending: Interventional Cardiology | Admitting: *Deleted

## 2021-10-30 DIAGNOSIS — I5022 Chronic systolic (congestive) heart failure: Secondary | ICD-10-CM | POA: Diagnosis not present

## 2021-10-30 DIAGNOSIS — I428 Other cardiomyopathies: Secondary | ICD-10-CM

## 2021-10-30 NOTE — Patient Instructions (Addendum)
   Nurse Visit   Date of Encounter: 10/30/2021 ID: Philip Chavez, DOB [birth year], MRN [id number]  PCP:  Ezequiel Essex, [place] Providers Cardiologist:  None Electrophysiologist:  [name], MD  Advanced Heart Failure:  Loralie Champagne, MD      Visit Details   VS:  BP (!) 110/58 (BP Location: Left Arm)   Pulse 88   Wt 195 lb 0.6 oz (88.5 kg)   SpO2 97%   BMI 25.73 kg/m  , BMI Body mass index is 25.73 kg/m.  Wt Readings from Last 3 Encounters:  10/30/21 195 lb 0.6 oz (88.5 kg)  08/17/21 195 lb (88.5 kg)  06/22/21 202 lb (91.6 kg)     Reason for visit: EKG and BMET  Performed today: Vital signs, EKG, BMET, reviewed Med list. Taken to DOD, Dr. Linard Millers III. Changes (medications, testing, etc.) : NONE Length of Visit: 30 minutes    Medications Adjustments/Labs and Tests Ordered:  Pt came into have BMET drawn and to have an EKG checked for prolonged QT per Dr. [name].  Pt takes Tikosyn 500 Mcg BID.  Pt also has Bi V ICD/PPM for pacing.  Per DOD, Dr. Linard Millers III, prolonged QT was noted on the EKG. The Qtc was 583.  Pt stated he has been asymptomatic, and has not had any concerns since last office visit.  Will follow up with Dr. Lovena Le when BMET panel results.       Signed, Varney Daily, RN  10/30/2021 5:01 PM

## 2021-10-30 NOTE — Telephone Encounter (Signed)
Pt called per Dr. Tanna Furry order to check EKG and BMET for prolonged Q-T.    Pt stated he can come in for RN visit this afternoon to have EKG and BMET checked at 330 pm.  Orders entered, RN visit created.

## 2021-10-30 NOTE — Addendum Note (Signed)
Addended by: Oleta Mouse C on: 10/30/2021 08:40 AM   Modules accepted: Orders

## 2021-10-31 LAB — BASIC METABOLIC PANEL
BUN/Creatinine Ratio: 12 (ref 10–24)
BUN: 16 mg/dL (ref 8–27)
CO2: 22 mmol/L (ref 20–29)
Calcium: 9.3 mg/dL (ref 8.6–10.2)
Chloride: 102 mmol/L (ref 96–106)
Creatinine, Ser: 1.37 mg/dL — ABNORMAL HIGH (ref 0.76–1.27)
Glucose: 161 mg/dL — ABNORMAL HIGH (ref 70–99)
Potassium: 4.2 mmol/L (ref 3.5–5.2)
Sodium: 140 mmol/L (ref 134–144)
eGFR: 55 mL/min/{1.73_m2} — ABNORMAL LOW (ref >59)

## 2021-10-31 NOTE — Progress Notes (Signed)
Remote ICD transmission.   

## 2021-10-31 NOTE — Addendum Note (Signed)
Addended by: Cheri Kearns A on: 10/31/2021 10:03 AM   Modules accepted: Level of Service

## 2021-11-06 ENCOUNTER — Other Ambulatory Visit (HOSPITAL_COMMUNITY): Payer: Self-pay

## 2021-11-06 ENCOUNTER — Other Ambulatory Visit: Payer: Self-pay | Admitting: Cardiology

## 2021-11-06 MED ORDER — ENTRESTO 24-26 MG PO TABS
1.0000 | ORAL_TABLET | Freq: Two times a day (BID) | ORAL | 11 refills | Status: DC
  Filled 2021-11-06: qty 60, 30d supply, fill #0
  Filled 2021-12-11: qty 60, 30d supply, fill #1
  Filled 2022-01-11: qty 60, 30d supply, fill #2
  Filled 2022-02-14: qty 60, 30d supply, fill #3
  Filled 2022-03-14: qty 60, 30d supply, fill #4
  Filled 2022-04-17: qty 60, 30d supply, fill #5
  Filled 2022-05-18: qty 60, 30d supply, fill #6
  Filled 2022-06-18: qty 60, 30d supply, fill #7
  Filled 2022-07-17: qty 60, 30d supply, fill #8
  Filled 2022-08-18: qty 60, 30d supply, fill #9
  Filled 2022-09-18: qty 60, 30d supply, fill #10
  Filled 2022-10-17: qty 60, 30d supply, fill #11

## 2021-11-12 DIAGNOSIS — Z125 Encounter for screening for malignant neoplasm of prostate: Secondary | ICD-10-CM | POA: Diagnosis not present

## 2021-11-15 NOTE — Progress Notes (Signed)
Patient ID: Philip Chavez, male   DOB: December 27, 1950, 71 y.o.   MRN: [id number] PCP: Ezequiel Essex, MD EP: Dr. Lovena Le HF cardiologist: Dr Aundra Dubin  71 y.o. with history of complete heart block/Medtronic PPM, cardiomyopathy, and atrial flutter s/p ablation presents for CHF clinic evaluation.  Patient developed complete heart block in 2/15 and had PPM placed at that time.  He paces his RV continuously.  In 2/15, EF was 50-55% by echo.  By 6/15, EF had fallen to 20-25%.  Cardiolite showed possible scar but no ischemia.  On 09/03/14, he was admitted with dyspnea and found to be in atrial flutter with RLL PNA and with volume overload.  Echo showed EF 15% with diffuse hypokinesis.  He was started on IV Lasix and IV amiodarone. He converted back to NSR.  He subsequently had atrial flutter ablation.  He had right and left heart cath in 9/16, showing nonobstructive CAD and optimized filling pressures with CI 2.09.  He had upgrade to CRT by Dr Lovena Le.  EF up to 30-35% in 4/17.  Subsequently, the LV lead dislodged.  He was offered lead revision, but preferred watchful waiting. Echo in 5/18 showed EF down to 20%.    Echo in 10/20 showed EF < 20%, normal RV.  Echo 12/21 showed EF <20%, moderate LV dilation with mild LVH, mildly decreased RV systolic function.   Patient was started on Tikosyn for atrial fibrillation and converted to NSR.    Echo (7/23) showed EF 15-20%, RV mildly reduced, mild to moderate MR.  Passed out 10/26/21, device showed sustained VT with successful shock. Follow up labs OK, ECG showed QTc 583 msec.   Mr Lapaglia returns for followup of CHF today. He continues to RV pace, LV lead has not been revised given minimal symptoms. Overall feeling fine. He lives in a split level home and does not have dyspnea with walking up/down stairs several times a day. No further syncopal events. Denies palpitations, abnormal bleeding, CP, dizziness, edema, or PND/Orthopnea. Appetite ok. No fever or chills. Weight at  home 198-200 pounds. Taking all medications.   ECG (personally reviewed from 10/30/21): NSR, V-paced, QTc 583 msec  MDT device interrogation (personally reviewed): 99% RV paced, stable thoracic impedance, 2 hr/day activity, VT/VF noted 10/25/21, short bursts of AF.    Labs (8/16): K 3.9, creatinine 1.03, AST 106, ALT 173, HCT 45.1, LDL 132 Labs (9/16): K 3.9, creatinine 1.16, HCT 42.5 Labs (10/16): K 3.9, creatinine 1.32, HCT 39.9 Labs (2/17): K 4.1, creatinine 1.26, BNP 107, digoxin 0.3 Labs (07/10/2015): K 3.9 Creatinine 1.23  Labs (1/18): K 4.1, creatinine 1.18, digoxin 0.3 Labs (2/18): K 4, creatinine 1.24, digoxin 0.4 Labs (5/18): K 4, creatinine 1.25, hgb 14.8, digoxin 0.5 Labs (10/18): K 4, creatinine 1.24, digoxin 0.2 Labs (4/19): K 3.8, creatinine 1.22, digoxin 0.5, LDL 69, HDL 40 Labs (8/19): K 3.9, creatinine 1.35, digoxin 0.3 Labs (12/19): K 4, creatinine 1.22 Labs (1/21): K 4.2, creatinine 1.34, LDL 121 Labs (4/21): digoxin level 0.7, K 3.8, creatinine 1.28 Labs (9/21): digoxin 0.2, hgb 15, K 3.8, creatinine 1.22 Labs (12/21): digoxin 0.5 Labs (1/22): K 3.8, creatinine 1.33 Labs (3/23): K 4.3, creatinine 1.39 Labs (9/23): K 4.2, creatinine 1.37  PMH: 1. H/o complete heart block: Has Medtronic PPM, placed in 2/15.   2. Cardiomyopathy: Echo (2/15) prior to PPM placement with EF 50-55%.  Echo (6/15) with EF 20-25%,  Cardiolite at that time showed possible scar but no ischemia.  Echo (8/16) with EF 15%, diffuse  hypokinesis, mildly dilated RV with normal systolic function, PA systolic pressure 69 mmHg.  LHC/RHC (9/16) with mean RA 4, PA 31/15 mean 20, mean PCWP 8, CI 2.09, 40% mLAD stenosis.  Upgrade to MDT CRT-D in 9/16.  - Echo (4/17) with EF 30-35%.   - LV lead dislodged and loss of BiV pacing.  - Echo (5/18) with EF 20%, mild LV dilation, normal RV size with mildly decreased systolic function, mild AI.  - Echo (10/20): EF < 20%, RV normal, mild MR.  - Echo (12/21): EF <20%,  moderate LV dilation with mild LVH, mildly decreased RV systolic function. - Echo (7/23): EF 15-20%, mildly decreased RV, mild to moderate MR 3. CKD 4. 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  5. Atrial tachycardia: paroxysmal. 6. Atrial flutter: s/p ablation in 8/16.  7. OSA 8. Hyperlipidemia 9. Shingles with post-herpetic neuralgia.  10. Atrial fibrillation: Paroxysmal.   FH: Father with CHF, complete heart block with PPM.  Sister with complete heart block and PPM.   SH: Retired Curator, nonsmoker, lives in [place]  ROS: All systems reviewed and negative except as per HPI  Current Outpatient Medications  Medication Sig Dispense Refill   apixaban (ELIQUIS) 5 MG TABS tablet Take 1 tablet (5 mg total) by mouth 2 (two) times daily. 180 tablet 3   dapagliflozin propanediol (FARXIGA) 10 MG TABS tablet Take 1 tablet by mouth daily. Absolute last refill please call [phone] to schedule follow up 30 tablet 11   digoxin (LANOXIN) 0.125 MG tablet Take 1 tablet by mouth once daily. 90 tablet 3   dofetilide (TIKOSYN) 500 MCG capsule Take 1 capsule by mouth 2 times daily. 60 capsule 3   furosemide (LASIX) 20 MG tablet TAKE 1 TABLET (20 MG TOTAL) 2 (TWO) TIMES DAILY BY MOUTH. 180 tablet 3   hydrALAZINE (APRESOLINE) 25 MG tablet Take 1/2 tablet by mouth 3 times daily. 405 tablet 0   isosorbide mononitrate (IMDUR) 30 MG 24 hr tablet Take 1 tablet (30 mg total) by mouth daily. 30 tablet 7   meloxicam (MOBIC) 15 MG tablet Take 15 mg by mouth daily.     Multiple Vitamins-Minerals (CERTAVITE/ANTIOXIDANTS) TABS Take 1 tablet by mouth daily with breakfast.     oxymetazoline (AFRIN) 0.05 % nasal spray Place 1 spray into both nostrils 2 (two) times daily as needed for congestion.     potassium chloride (KLOR-CON) 10 MEQ tablet TAKE 1 TABLET BY MOUTH 2 TIMES DAILY. 180 tablet 2   sacubitril-valsartan (ENTRESTO) 24-26 MG Take 1 tablet by mouth 2 (two) times daily. 60 tablet 11   simvastatin (ZOCOR) 40 MG  tablet TAKE 1 TABLET BY MOUTH EVERY DAY 90 tablet 3   spironolactone (ALDACTONE) 25 MG tablet TAKE 1 TABLET BY MOUTH ONCE A DAY 90 tablet 3   No current facility-administered medications for this encounter.   Wt Readings from Last 3 Encounters:  11/16/21 89.3 kg (196 lb 12.8 oz)  10/30/21 88.5 kg (195 lb 0.6 oz)  08/17/21 88.5 kg (195 lb)   BP 104/62   Pulse 79   Wt 89.3 kg (196 lb 12.8 oz)   SpO2 98%   BMI 25.96 kg/m  Physical Exam General:  NAD. No resp difficulty, walked into clinic. HEENT: Normal Neck: Supple. No JVD. Carotids 2+ bilat; no bruits. No lymphadenopathy or thryomegaly appreciated. Cor: PMI nondisplaced. Regular rate & rhythm. No rubs, gallops or murmurs. Lungs: Clear Abdomen: Soft, nontender, nondistended. No hepatosplenomegaly. No bruits or masses. Good bowel sounds.  Extremities: No cyanosis, clubbing, rash, edema Neuro: Alert & oriented x 3, cranial nerves grossly intact. Moves all 4 extremities w/o difficulty. Affect pleasant.  Assessment/Plan: 1. Chronic systolic CHF: Nonischemic cardiomyopathy.  EF 15% on echo in 8/16.  Patient had borderline reduced EF when PPM was placed in 2/15.  After that, EF fell significantly.  He has a history of complete heart block and father had CHF and CHB with pacemaker, and sounds like sister also had CHB with pacemaker. 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  He had CRT-D upgrade.  No significant CAD on last cath.  Echo in 4/17 showed improvement in EF to 30-35%.  However, his LV lead became dislodged and he is no longer BiV pacing, now RV pacing > 99% of the time.  Echo in 5/18 showed EF down to 20%.  Echo in 12/21 shows EF < 20% and moderately dilated. Repeat echo 7/23 showed EF remains 15-20%. Symptomatically, he is still doing well (NYHA class I-II) with no volume overload on exam or by Optivol. He feels better now that he is back in NSR on Tikosyn.  However, I am concerned about his  long-term prognosis as his LV is very dysfunctional and dilated.  Given minimal symptoms, decision was made not to revise his LV lead.  - He is off bisoprolol, unclear as to why.  He will need to restart this in the future.  Will not start today with soft BP.  - Continue Entresto 24/26 bid.  - Continue spironolactone 25 mg daily. BMET today. - Continue Lasix 20 mg bid.  - Continue hydralazine 12.5 mg tid + Imdur 30 daily.    - Continue digoxin. He took med today. Check as a trough in 1-2 weeks.  - Continue Farxiga 10 mg daily.  - We have discussed genetic evaluation to look for LMNA or SCN5A mutations that could be used for familial screening, he has wanted to hold off.  2. Atrial flutter: s/p ablation.  3. Atrial fibrillation: He is in NSR on Tikosyn. He has done well with Tikosyn.  His paced QTc is prolonged. Dr. Aundra Dubin previously discussed this with Dr. Lovena Le, and given the presence of an ICD, decided to keep his Tikosyn at the current dose. Needs follow up with Dr. Lovena Le. - Continue Eliquis.  - Continue current Tikosyn. Check BMET and Mag today. 3. PE/DVT: remote and associated with surgery.   4. Hyperlipidemia: He has mild CAD.   - No ASA with Eliquis.  No bleeding issues. - Continue simvastatin 40 mg daily.   5. Complete heart block: Now RV pacing with loss of LV lead, see discussion above.   Followup in 3-4 months with Dr. Aundra Dubin.  Maricela Bo [facility] FNP-BC 11/16/2021

## 2021-11-16 ENCOUNTER — Encounter (HOSPITAL_COMMUNITY): Payer: Self-pay

## 2021-11-16 ENCOUNTER — Ambulatory Visit (HOSPITAL_COMMUNITY)
Admission: RE | Admit: 2021-11-16 | Discharge: 2021-11-16 | Disposition: A | Discharge status: Home / Self Care | Payer: Medicare PPO | Source: Ambulatory Visit | Attending: Family Medicine | Admitting: Family Medicine

## 2021-11-16 VITALS — BP 104/62 | HR 79 | Wt 196.8 lb

## 2021-11-16 DIAGNOSIS — I442 Atrioventricular block, complete: Secondary | ICD-10-CM | POA: Diagnosis not present

## 2021-11-16 DIAGNOSIS — Z7984 Long term (current) use of oral hypoglycemic drugs: Secondary | ICD-10-CM | POA: Insufficient documentation

## 2021-11-16 DIAGNOSIS — I428 Other cardiomyopathies: Secondary | ICD-10-CM | POA: Diagnosis not present

## 2021-11-16 DIAGNOSIS — Z7901 Long term (current) use of anticoagulants: Secondary | ICD-10-CM | POA: Diagnosis not present

## 2021-11-16 DIAGNOSIS — E785 Hyperlipidemia, unspecified: Secondary | ICD-10-CM

## 2021-11-16 DIAGNOSIS — I4892 Unspecified atrial flutter: Secondary | ICD-10-CM | POA: Insufficient documentation

## 2021-11-16 DIAGNOSIS — I480 Paroxysmal atrial fibrillation: Secondary | ICD-10-CM | POA: Diagnosis not present

## 2021-11-16 DIAGNOSIS — N189 Chronic kidney disease, unspecified: Secondary | ICD-10-CM | POA: Diagnosis not present

## 2021-11-16 DIAGNOSIS — Z8249 Family history of ischemic heart disease and other diseases of the circulatory system: Secondary | ICD-10-CM | POA: Diagnosis not present

## 2021-11-16 DIAGNOSIS — I2782 Chronic pulmonary embolism: Secondary | ICD-10-CM | POA: Diagnosis not present

## 2021-11-16 DIAGNOSIS — I5022 Chronic systolic (congestive) heart failure: Secondary | ICD-10-CM | POA: Diagnosis not present

## 2021-11-16 DIAGNOSIS — Z95810 Presence of automatic (implantable) cardiac defibrillator: Secondary | ICD-10-CM | POA: Diagnosis not present

## 2021-11-16 DIAGNOSIS — Z7689 Persons encountering health services in other specified circumstances: Secondary | ICD-10-CM | POA: Insufficient documentation

## 2021-11-16 DIAGNOSIS — Z79899 Other long term (current) drug therapy: Secondary | ICD-10-CM | POA: Diagnosis not present

## 2021-11-16 DIAGNOSIS — I2510 Atherosclerotic heart disease of native coronary artery without angina pectoris: Secondary | ICD-10-CM | POA: Insufficient documentation

## 2021-11-16 LAB — MAGNESIUM: Magnesium: 2 mg/dL (ref 1.7–2.4)

## 2021-11-16 LAB — BASIC METABOLIC PANEL
Anion gap: 8 (ref 5–15)
BUN: 14 mg/dL (ref 8–23)
CO2: 24 mmol/L (ref 22–32)
Calcium: 9 mg/dL (ref 8.9–10.3)
Chloride: 105 mmol/L (ref 98–111)
Creatinine, Ser: 1.16 mg/dL (ref 0.61–1.24)
GFR, Estimated: >60 mL/min (ref >60)
Glucose, Bld: 99 mg/dL (ref 70–99)
Potassium: 4.1 mmol/L (ref 3.5–5.1)
Sodium: 137 mmol/L (ref 135–145)

## 2021-11-16 NOTE — Patient Instructions (Signed)
It was great to see you today! No medication changes are needed at this time.  Labs today We will only contact you if something comes back abnormal or we need to make some changes. Otherwise no news is good news!  Labs needed in 1-4 weeks -when you return for labs do not take digoxin the morning of appointment  You have been referred to [facility]rophys[facility] (Dr Lovena Le) -they will be in contact with an appointment  Your physician wants you to follow-up in: 3 months with Dr Kendall Flack will receive a reminder letter in the mail two months in advance. If you don't receive a letter, please call our office to schedule the follow-up appointment January 2024.   Do the following things EVERYDAY: Weigh yourself in the morning before breakfast. Write it down and keep it in a log. Take your medicines as prescribed Eat low salt foods--Limit salt (sodium) to 2000 mg per day.  Stay as active as you can everyday Limit all fluids for the day to less than 2 liters   At the [facility], you and your health needs are our priority. As part of our continuing mission to provide you with exceptional heart care, we have created designated Provider Care Teams. These Care Teams include your primary Cardiologist (physician) and Advanced Practice Providers (APPs- Physician Assistants and Nurse Practitioners) who all work together to provide you with the care you need, when you need it.   You may see any of the following providers on your designated Care Team at your next follow up: Dr Glori Bickers Dr Loralie Champagne Dr. Roxana Hires, NP Lyda Jester, [place] [facility] [place] [place], [place] Forestine Na, NP Audry Riles, PharmD   Please be sure to bring in all your medications bottles to every appointment.

## 2021-11-17 ENCOUNTER — Other Ambulatory Visit (HOSPITAL_COMMUNITY): Payer: Self-pay

## 2021-11-19 ENCOUNTER — Ambulatory Visit: Payer: Medicare PPO | Attending: Internal Medicine

## 2021-11-19 DIAGNOSIS — I5022 Chronic systolic (congestive) heart failure: Secondary | ICD-10-CM | POA: Diagnosis not present

## 2021-11-19 DIAGNOSIS — I442 Atrioventricular block, complete: Secondary | ICD-10-CM

## 2021-11-19 LAB — CUP PACEART REMOTE DEVICE CHECK
Battery Remaining Longevity: 2 mo
Battery Voltage: 2.78 V
Brady Statistic AP VP Percent: 7.06 %
Brady Statistic AP VS Percent: 0.01 %
Brady Statistic AS VP Percent: 92.13 %
Brady Statistic AS VS Percent: 0.8 %
Brady Statistic RA Percent Paced: 6.85 %
Brady Statistic RV Percent Paced: 98.64 %
Date Time Interrogation Session: 20231016033423
HighPow Impedance: 54 Ohm
Implantable Lead Implant Date: 20150209
Implantable Lead Implant Date: 20160919
Implantable Lead Implant Date: 20160919
Implantable Lead Location: 753858
Implantable Lead Location: 753859
Implantable Lead Location: 753860
Implantable Lead Model: 4598
Implantable Lead Model: 5076
Implantable Lead Model: 6935
Implantable Pulse Generator Implant Date: 20160919
Lead Channel Impedance Value: 247 Ohm
Lead Channel Impedance Value: 266 Ohm
Lead Channel Impedance Value: 304 Ohm
Lead Channel Impedance Value: 304 Ohm
Lead Channel Impedance Value: 323 Ohm
Lead Channel Impedance Value: 342 Ohm
Lead Channel Impedance Value: 342 Ohm
Lead Channel Impedance Value: 380 Ohm
Lead Channel Impedance Value: 437 Ohm
Lead Channel Impedance Value: 456 Ohm
Lead Channel Impedance Value: 456 Ohm
Lead Channel Impedance Value: 456 Ohm
Lead Channel Impedance Value: 494 Ohm
Lead Channel Pacing Threshold Amplitude: 0.75 V
Lead Channel Pacing Threshold Amplitude: 0.875 V
Lead Channel Pacing Threshold Amplitude: 2.5 V
Lead Channel Pacing Threshold Pulse Width: 0.4 ms
Lead Channel Pacing Threshold Pulse Width: 0.4 ms
Lead Channel Pacing Threshold Pulse Width: 0.4 ms
Lead Channel Sensing Intrinsic Amplitude: 12.375 mV
Lead Channel Sensing Intrinsic Amplitude: 12.375 mV
Lead Channel Sensing Intrinsic Amplitude: 2.75 mV
Lead Channel Sensing Intrinsic Amplitude: 2.75 mV
Lead Channel Setting Pacing Amplitude: 1.5 V
Lead Channel Setting Pacing Amplitude: 2 V
Lead Channel Setting Pacing Pulse Width: 0.4 ms
Lead Channel Setting Sensing Sensitivity: 0.3 mV

## 2021-11-20 ENCOUNTER — Other Ambulatory Visit: Payer: Self-pay

## 2021-11-20 ENCOUNTER — Ambulatory Visit (INDEPENDENT_AMBULATORY_CARE_PROVIDER_SITE_OTHER): Payer: Medicare PPO

## 2021-11-20 ENCOUNTER — Encounter (HOSPITAL_COMMUNITY): Payer: Self-pay | Admitting: Gastroenterology

## 2021-11-20 ENCOUNTER — Encounter: Payer: Self-pay | Admitting: Internal Medicine

## 2021-11-20 DIAGNOSIS — Z95810 Presence of automatic (implantable) cardiac defibrillator: Secondary | ICD-10-CM

## 2021-11-20 DIAGNOSIS — I5022 Chronic systolic (congestive) heart failure: Secondary | ICD-10-CM

## 2021-11-20 NOTE — Progress Notes (Signed)
[facility] DEVICE PROGRAMM[facility]  Patient Information: Name:  Philip Chavez  DOB:  1950-08-09  MRN:  [id number]  Planned Procedure: colonoscopy with propofol  Surgeon: Wilford Corner  Date of Procedure: 11-28-21  Cautery will be used.  Position during surgery: N/A   Device Information:  Clinic EP Physician:  [name], MD   Device Type:  Defibrillator Manufacturer and Phone #:  Medtronic: [phone] Pacemaker Dependent?:  Yes.   Date of Last Device Check:  11/19/2021 Normal Device Function?:  Yes.   Pt is 2 months until end of life for battery.  Electrophysiologist's Recommendations:  Have magnet available. Provide continuous ECG monitoring when magnet is used or reprogramming is to be performed.  Procedure should not interfere with device function.  No device programming or magnet placement needed.  Per Device Clinic Standing Orders, Damian Leavell, RN  12:52 PM 11/20/2021

## 2021-11-20 NOTE — Progress Notes (Signed)
EPIC Encounter for ICM Monitoring  Patient Name: Philip Chavez is a 71 y.o. male Date: 11/20/2021 Primary Care Physican: Ezequiel Essex, MD Primary Cardiologist: Aundra Dubin Electrophysiologist: Lovena Le RV Pacing:  98.6%        07/26/2021 Weight: 200 lbs 09/05/2021 Weight: 200 lbs 10/12/2021 Weight: 200 ;bs   Since 16-Nov-2021 Time in AT/AF <0.1 hr/day (<0.1%)   Battery Longevity: 2 months           Spoke with patient and heart failure questions reviewed.  Transmission results reviewed.  Pt asymptomatic for fluid accumulation.  Reports feeling well at this time and voices no complaints.     OptiVol Thoracic impedance suggesting normal fluid levels.     Prescribed:  Furosemide 20 mg 1 tablet by mouth twice a day. Potassium 10 mEq take 1 tablet by mouth twice a day Spironolactone 25 mg take 1 tablet by mouth once a day   Labs:       08/17/2021 Creatinine 1.48, BUN 15, Potassium 4.2, Sodium 140, GFR 51 05/03/2021 Creatinine 1.39, BUN 17, Potassium 4.3, Sodium 140, GFR 55 A complete set of results can be found in Results Review.   Recommendations:  No changes and encouraged to call if experiencing any fluid symptoms.   Follow-up plan: ICM clinic phone appointment on 12/31/2021.   91 day device clinic remote transmission 02/21/2022.     EP/Cardiology Office Visits:  11/26/2021 with Oda Kilts, PA.     Copy of ICM check sent to Dr. Lovena Le.  3 month ICM trend: 11/19/2021.    12-14 Month ICM trend:     Rosalene Billings, RN 11/20/2021 7:48 AM

## 2021-11-21 ENCOUNTER — Encounter: Payer: Self-pay | Admitting: Family Medicine

## 2021-11-21 ENCOUNTER — Ambulatory Visit (INDEPENDENT_AMBULATORY_CARE_PROVIDER_SITE_OTHER): Payer: Medicare PPO | Admitting: Family Medicine

## 2021-11-21 VITALS — Ht 73.0 in | Wt 196.0 lb

## 2021-11-21 DIAGNOSIS — Z0000 Encounter for general adult medical examination without abnormal findings: Secondary | ICD-10-CM

## 2021-11-21 NOTE — Patient Instructions (Signed)
You spoke to Ezequiel Essex, MD over the phone for your annual wellness visit.  We discussed goals:  Goals      Exercise 3x per week (30 min per time)     Wants to keep walking the 30 minutes a day.        We also discussed recommended health maintenance. Please call our office and schedule a visit. As discussed, you are due for: Health Maintenance  Topic Date Due   Zoster Vaccines- Shingrix (1 of 2) Never done   Pneumonia Vaccine 76+ Years old (3 - PPSV23 or PCV20) 11/26/2019   INFLUENZA VACCINE  09/04/2021   COVID-19 Vaccine (5 - Pfizer series) 09/17/2021   TETANUS/TDAP  04/14/2023   COLONOSCOPY (Pts 45-43yr Insurance coverage will need to be confirmed)  12/23/2023   Our clinic's number is [phone] Please call with questions or concerns about what we discussed today.

## 2021-11-21 NOTE — Progress Notes (Signed)
Subjective:   Philip Chavez is a 71 y.o. male who presents for Medicare Annual/Subsequent preventive examination.  The patient consented to a virtual visit. Patient consented to have virtual visit and was identified by name and date of birth. Method of visit: Telephone Encounter participants: Patient: Philip Chavez - located at home Nurse/Provider: Ezequiel Essex - located at [facility] Others (if applicable): n/a  Review of Systems:   Cardiac Risk Factors include: family history of premature cardiovascular disease;male gender;hypertension;Other (see comment), Risk factor comments: Known cardiac history, defibrillator     Objective:    Vitals: Ht '6\' 1"'$  (1.854 m)   Wt 196 lb (88.9 kg)   BMI 25.86 kg/m   Body mass index is 25.86 kg/m.     11/20/2021   11:50 AM 05/18/2021    1:37 PM 05/03/2021   10:52 AM 09/18/2020    2:36 PM 07/25/2020    9:00 AM 06/04/2019    2:59 PM 06/27/2016    4:12 PM  Advanced Directives  Does Patient Have a Medical Advance Directive? Yes No No Yes No No No  Type of Advance Directive Living will;Healthcare Power of [place]     Does patient want to make changes to medical advance directive? No - Patient declined   No - Patient declined     Copy of [place] in Chart? No - copy requested   Yes - validated most recent copy scanned in chart (See row information)     Would patient like information on creating a medical advance directive?  No - Patient declined No - Patient declined  No - Patient declined No - Patient declined No - Patient declined    Tobacco Social History   Tobacco Use  Smoking Status Former   Packs/day: 1.50   Years: 10.00   Total pack years: 15.00   Types: Cigarettes   Quit date: 02/04/1982   Years since quitting: 39.8   Passive exposure: Past  Smokeless Tobacco Never  Tobacco Comments   09/02/2014  "quit smoking 20-30 yr ago"     Counseling given: Not Answered Tobacco comments:  09/02/2014  "quit smoking 20-30 yr ago"   Clinical Intake:  Pre-visit preparation completed: No  Pain : No/denies pain        How often do you need to have someone help you when you read instructions, pamphlets, or other written materials from your doctor or pharmacy?: 1 - Never What is the last grade level you completed in school?: 12  Interpreter Needed?: No     Past Medical History:  Diagnosis Date   AICD (automatic cardioverter/defibrillator) present 0919/2016   BIV    Annual physical exam 06/09/2019   Atrial flutter ([place])    a. 08/2014 presented w/ aflutter->converted on amio;  b. CHA2DS2VASc = 2 -->Xarelto.   Atrial tachycardia    Chronic combined systolic and diastolic CHF (congestive heart failure) ([place])    a. 03/2013 Echo EF 50-55%;  b. 07/2013 Echo EF 20-25%, diff HK, Gr3 DD;  c. 08/2014 Echo: EF 15%, diff HK, mild AI/MR, mildly dil LA/RV, mod TR, PASP 22mHg.   DVT ([place] venous [place]) ([place]    a. 2007 RLE: S/P ankle surgery;  b. 03/2013 LLE DVT & PE-->Xarelto.   Embolism, pulmonary with infarction ([place]    a. 2007 RLE: S/P ankle surgery;  b. 03/2013 LLE DVT & PE-->Xarelto.   Hypercholesterolemia    Hypercoagulable state ([place]    Hypertension  Musculoskeletal neck pain    Nonischemic cardiomyopathy ([place])    a. 07/2013 Echo: EF 20-25%;  b. 07/2013 Myoview: Large inferior, lateral, apical scar w/ HK, no ischemia, EF 17%;  b. 08/2014 Echo: EF 15%, diff HK.   Pneumonia ~ 04/2014; 09/02/2014   Presence of permanent cardiac pacemaker    a. 03/2013 s/p MDT ADDRL1 Adapta DC PPM, ser # [id number].   Sleep apnea    does not wear mask (09/02/2014)   Third degree heart block ([place])    a. 03/2013 s/p MDT ADDRL1 Adapta DC PPM, ser # [id number].   Past Surgical History:  Procedure Laterality Date   CARDIAC CATHETERIZATION N/A 10/17/2014   Procedure: Right/Left Heart Cath and Coronary Angiography;  Surgeon: Larey Dresser, MD;  Location: [place] CV LAB;  Service:  Cardiovascular;  Laterality: N/A;   CARDIOVERSION N/A 07/26/2020   Procedure: CARDIOVERSION;  Surgeon: Buford Dresser, MD;  Location: [facility] ENDOSCOPY;  Service: Cardiovascular;  Laterality: N/A;   ELECTROPHYSIOLOGIC STUDY N/A 09/07/2014   Procedure: A-Flutter;  Surgeon: Evans Lance, MD;  Location: [place] CV LAB;  Service: Cardiovascular;  Laterality: N/A;   EP IMPLANTABLE DEVICE  10/24/2014   BIV   EP IMPLANTABLE DEVICE N/A 10/24/2014   Procedure: BiV ICD Upgrade;  Surgeon: Evans Lance, MD;  Location: [place] CV LAB;  Service: Cardiovascular;  Laterality: N/A;   FOOT FRACTURE SURGERY Right 2007   FRACTURE SURGERY     INGUINAL HERNIA REPAIR Right 1980's   INGUINAL HERNIA REPAIR Right 09/22/2020   Procedure: LAPAROSCOPIC RIGHT INGUINAL HERNIA REPAIR WITH MESH;  Surgeon: Ralene Ok, MD;  Location: [place];  Service: General;  Laterality: Right;   INSERT / REPLACE / REMOVE PACEMAKER     MDT ADDRL1 pacemaker implanted by Dr Lovena Le for complete heart block   PERMANENT PACEMAKER INSERTION N/A 03/15/2013   Procedure: PERMANENT PACEMAKER INSERTION;  Surgeon: Evans Lance, MD;  [facility] C[facility];  Service: Cardiovascular;  Laterality: N/A;   Family History  Problem Relation Age of Onset   Diabetes type II Mother    Heart disease Father    Arrhythmia Father    Arrhythmia Sister    Hypertension Neg Hx    Heart attack Neg Hx    Stroke Neg Hx    Social History   Socioeconomic History   Marital status: Married    Spouse name: Luellen Pucker   Number of children: 2   Years of education: Not on file   Highest education level: Not on file  Occupational History   Not on file  Tobacco Use   Smoking status: Former    Packs/day: 1.50    Years: 10.00    Total pack years: 15.00    Types: Cigarettes    Quit date: 02/04/1982    Years since quitting: 39.8    Passive exposure: Past   Smokeless tobacco: Never   Tobacco comments:    09/02/2014  "quit smoking 20-30 yr ago"  Vaping Use    Vaping Use: Never used  Substance and Sexual Activity   Alcohol use: No   Drug use: No   Sexual activity: Yes  Other Topics Concern   Not on file  Social History Narrative   Not on file   Social Determinants of Health   Financial Resource Strain: Not on file  Food Insecurity: No Food Insecurity (08/30/2021)   Hunger Vital Sign    Worried About Running Out of Food in the Last Year: Never true  Ran Out of Food in the Last Year: Never true  Transportation Needs: No Transportation Needs (08/30/2021)   PRAPARE - Hydrologist (Medical): No    Lack of Transportation (Non-Medical): No  Physical Activity: Not on file  Stress: Not on file  Social Connections: Not on file    Outpatient Encounter Medications as of 11/21/2021  Medication Sig   apixaban (ELIQUIS) 5 MG TABS tablet Take 1 tablet (5 mg total) by mouth 2 (two) times daily.   dapagliflozin propanediol (FARXIGA) 10 MG TABS tablet Take 1 tablet by mouth daily. Absolute last refill please call [phone] to schedule follow up   digoxin (LANOXIN) 0.125 MG tablet Take 1 tablet by mouth once daily.   dofetilide (TIKOSYN) 500 MCG capsule Take 1 capsule by mouth 2 times daily.   furosemide (LASIX) 20 MG tablet TAKE 1 TABLET (20 MG TOTAL) 2 (TWO) TIMES DAILY BY MOUTH.   hydrALAZINE (APRESOLINE) 25 MG tablet Take 1/2 tablet by mouth 3 times daily.   isosorbide mononitrate (IMDUR) 30 MG 24 hr tablet Take 1 tablet (30 mg total) by mouth daily.   meloxicam (MOBIC) 15 MG tablet Take 15 mg by mouth daily.   Multiple Vitamins-Minerals (CERTAVITE/ANTIOXIDANTS) TABS Take 1 tablet by mouth daily with breakfast.   oxymetazoline (AFRIN) 0.05 % nasal spray Place 1 spray into both nostrils 2 (two) times daily as needed for congestion.   potassium chloride (KLOR-CON) 10 MEQ tablet TAKE 1 TABLET BY MOUTH 2 TIMES DAILY.   sacubitril-valsartan (ENTRESTO) 24-26 MG Take 1 tablet by mouth 2 (two) times daily.   simvastatin  (ZOCOR) 40 MG tablet TAKE 1 TABLET BY MOUTH EVERY DAY   spironolactone (ALDACTONE) 25 MG tablet TAKE 1 TABLET BY MOUTH ONCE A DAY   [DISCONTINUED] isosorbide dinitrate (ISORDIL) 10 MG tablet Take 1 tablet (10 mg total) by mouth 3 (three) times daily.   No facility-administered encounter medications on file as of 11/21/2021.    Activities of Daily Living    11/21/2021    9:24 AM  In your present state of health, do you have any difficulty performing the following activities:  Hearing? 0  Vision? 1  Comment Reading glasses only  Difficulty concentrating or making decisions? 0  Walking or climbing stairs? 0  Dressing or bathing? 0  Doing errands, shopping? 0  Preparing Food and eating ? N  Using the Toilet? N  In the past six months, have you accidently leaked urine? N  Do you have problems with loss of bowel control? N  Managing your Medications? N  Managing your Finances? N  Housekeeping or managing your Housekeeping? N    Patient Care Team: Ezequiel Essex, MD as PCP - General (Family Medicine) Larey Dresser, MD as PCP - Advanced Heart Failure (Cardiology) Evans Lance, MD as PCP - Electrophysiology (Cardiology)   Assessment:   This is a routine wellness examination for Philip Chavez.  Exercise Activities and Dietary recommendations Current Exercise Habits: Home exercise routine, Type of exercise: walking, Time (Minutes): 30, Frequency (Times/Week): 7, Weekly Exercise (Minutes/Week): 210, Intensity: Mild, Exercise limited by: None identified   Goals      Exercise 3x per week (30 min per time)     Wants to keep walking the 30 minutes a day.         Fall Risk    11/21/2021    9:23 AM 05/18/2021    1:37 PM 05/03/2021   10:53 AM 06/04/2019    2:58 PM  12/28/2014   11:14 AM  Fall Risk   Falls in the past year? 1 0 0 0 Yes  Number falls in past yr: 0   0   Comment Defibrillator went off, shocked, fell      Injury with Fall? 1   0   Comment Bruising      Risk for fall  due to : Other (Comment)      Follow up Falls prevention discussed;Falls evaluation completed       Is the patient's home free of loose throw rugs in walkways, pet beds, electrical cords, etc?   yes      Grab bars in the bathroom? no      Handrails on the stairs?   no      Adequate lighting?   yes  Timed Get Up and Go Performed: n/a - phone call  Depression Screen    11/21/2021    9:24 AM 05/18/2021    1:38 PM 05/03/2021   10:53 AM 06/04/2019    2:59 PM  PHQ 2/9 Scores  PHQ - 2 Score 0 0 0 0  PHQ- 9 Score  0 0     Cognitive Function    11/21/2021    9:27 AM  MMSE - Mini Mental State Exam  Orientation to time 5  Orientation to Place 5  Language- repeat 1        Immunization History  Administered Date(s) Administered   Fluad Quad(high Dose 65+) 11/26/2018   Influenza, High Dose Seasonal PF 12/17/2016, 10/28/2017, 01/05/2021   Influenza,inj,Quad PF,6+ Mos 10/25/2014   PFIZER(Purple Top)SARS-COV-2 Vaccination 04/02/2019, 04/28/2019, 11/15/2019   Pfizer Covid-19 Vaccine Bivalent Booster 60yr & up 05/18/2021   Pneumococcal Conjugate-13 11/26/2018   Pneumococcal Polysaccharide-23 07/15/2013   Tdap 04/13/2013    Screening Tests Health Maintenance  Topic Date Due   FOOT EXAM  Never done   OPHTHALMOLOGY EXAM  Never done   Diabetic kidney evaluation - Urine ACR  Never done   Zoster Vaccines- Shingrix (1 of 2) Never done   Pneumonia Vaccine 71 Years old (3 - PPSV23 or PCV20) 11/26/2019   INFLUENZA VACCINE  09/04/2021   COVID-19 Vaccine (5 - Pfizer series) 09/17/2021   HEMOGLOBIN A1C  11/03/2021   Diabetic kidney evaluation - GFR measurement  11/17/2022   TETANUS/TDAP  04/14/2023   COLONOSCOPY (Pts 45-420yrInsurance coverage will need to be confirmed)  12/23/2023   Hepatitis C Screening  Completed   HPV VACCINES  Aged Out   Cancer Screenings: Lung: Low Dose CT Chest recommended if Age 71-80ears, 30 pack-year currently smoking OR have quit w/in 15years. Patient  does not qualify. Colorectal: UTD  Additional Screenings: None      Plan:   I have personally reviewed and noted the following in the patient's chart:   Medical and social history Use of alcohol, tobacco or illicit drugs  Current medications and supplements Functional ability and status Nutritional status Physical activity Advanced directives List of other physicians Hospitalizations, surgeries, and ER visits in previous 12 months Vitals Screenings to include cognitive, depression, and falls Referrals and appointments  In addition, I have reviewed and discussed with patient certain preventive protocols, quality metrics, and best practice recommendations. A written personalized care plan for preventive services as well as general preventive health recommendations were provided to patient.    This visit was conducted virtually in the setting of the [place].    CaEzequiel EssexMD  11/21/2021

## 2021-11-22 ENCOUNTER — Ambulatory Visit (HOSPITAL_COMMUNITY)
Admission: RE | Admit: 2021-11-22 | Discharge: 2021-11-22 | Disposition: A | Discharge status: Home / Self Care | Payer: Medicare PPO | Source: Ambulatory Visit | Attending: Cardiology | Admitting: Cardiology

## 2021-11-22 DIAGNOSIS — I428 Other cardiomyopathies: Secondary | ICD-10-CM | POA: Diagnosis not present

## 2021-11-22 LAB — DIGOXIN LEVEL: Digoxin Level: 0.3 ng/mL — ABNORMAL LOW (ref 0.8–2.0)

## 2021-11-26 ENCOUNTER — Encounter: Payer: Medicare PPO | Admitting: Student

## 2021-11-26 ENCOUNTER — Other Ambulatory Visit (HOSPITAL_COMMUNITY): Payer: Self-pay

## 2021-11-26 MED ORDER — PEG 3350-KCL-NA BICARB-NACL 420 G PO SOLR
ORAL | 0 refills | Status: DC
  Filled 2021-11-26: qty 4000, 1d supply, fill #0

## 2021-11-26 NOTE — Progress Notes (Deleted)
Electrophysiology Office Note Date: 11/26/2021  ID:  Semaj Chavez, DOB Oct 22, 1950, MRN [id number]  PCP: Ezequiel Essex, MD Primary Cardiologist: None Electrophysiologist: [name], MD   CC: Routine ICD follow-up  Philip Chavez is a 71 y.o. male seen today for [name], MD for acute visit due to ICD shock.    Device alert received 10/26/21 for sustained VT with successful HV therapy. Event occurred 9/22 @ 10:29, EGM showed sustained VT, rate mean 222 falling in to the VF zone, arrhythmia converted with 30J Additional 6 NSVT, no therapy. Presenting AF, controlled rates, burden 0.7%, Eliquis. Route to triage per protocol.  Patient reports ***.  he denies chest pain, palpitations, dyspnea, PND, orthopnea, nausea, vomiting, dizziness, syncope, edema, weight gain, or early satiety.     {He/she (caps):30048} has not had ICD shocks.   Device History: {Blank single:19197::"Medtronic","St. Jude","[employer] Scientific","Biotronik"} {Blank single:19197::"Dual Chamber","Single Chamber","BiV","S-ICD"} ICD implanted *** for *** History of appropriate therapy: {yes/no:20286} History of AAD therapy: {Blank single:19197::"No","Yes; currently on ***","Yes; previously tolerated ***"}   Past Medical History:  Diagnosis Date   AICD (automatic cardioverter/defibrillator) present 0919/2016   BIV    Annual physical exam 06/09/2019   Atrial flutter ([place])    a. 08/2014 presented w/ aflutter->converted on amio;  b. CHA2DS2VASc = 2 -->Xarelto.   Atrial tachycardia    Chronic combined systolic and diastolic CHF (congestive heart failure) ([place])    a. 03/2013 Echo EF 50-55%;  b. 07/2013 Echo EF 20-25%, diff HK, Gr3 DD;  c. 08/2014 Echo: EF 15%, diff HK, mild AI/MR, mildly dil LA/RV, mod TR, PASP 37mHg.   DVT (deep venous thrombosis) ([place]    a. 2007 RLE: S/P ankle surgery;  b. 03/2013 LLE DVT & PE-->Xarelto.   Embolism, pulmonary with infarction ([place]    a. 2007 RLE: S/P ankle surgery;  b. 03/2013 LLE  DVT & PE-->Xarelto.   Hypercholesterolemia    Hypercoagulable state ([place]    Hypertension    Musculoskeletal neck pain    Nonischemic cardiomyopathy ([place]    a. 07/2013 Echo: EF 20-25%;  b. 07/2013 Myoview: Large inferior, lateral, apical scar w/ HK, no ischemia, EF 17%;  b. 08/2014 Echo: EF 15%, diff HK.   Pneumonia ~ 04/2014; 09/02/2014   Presence of permanent cardiac pacemaker    a. 03/2013 s/p MDT ADDRL1 Adapta DC PPM, ser # [id number].   Sleep apnea    does not wear mask (09/02/2014)   Third degree heart block ([place]    a. 03/2013 s/p MDT ADDRL1 Adapta DC PPM, ser # [id number].   Past Surgical History:  Procedure Laterality Date   CARDIAC CATHETERIZATION N/A 10/17/2014   Procedure: Right/Left Heart Cath and Coronary Angiography;  Surgeon: DLarey Dresser MD;  [place];  Service: Cardiovascular;  Laterality: N/A;   CARDIOVERSION N/A 07/26/2020   Procedure: CARDIOVERSION;  Surgeon: CBuford Dresser MD;  [facility];  Service: Cardiovascular;  Laterality: N/A;   ELECTROPHYSIOLOGIC STUDY N/A 09/07/2014   Procedure: A-Flutter;  Surgeon: GEvans Lance MD;  [place] [place];  Service: Cardiovascular;  Laterality: N/A;   EP IMPLANTABLE DEVICE  10/24/2014   BIV   EP IMPLANTABLE DEVICE N/A 10/24/2014   Procedure: BiV ICD Upgrade;  Surgeon: GEvans Lance MD;  [facility];  Service: Cardiovascular;  Laterality: N/A;   FOOT FRACTURE SURGERY Right 2007   FRACTURE SURGERY     INGUINAL HERNIA REPAIR Right 1980's   INGUINAL HERNIA REPAIR Right 09/22/2020  Procedure: LAPAROSCOPIC RIGHT INGUINAL HERNIA REPAIR WITH MESH;  Surgeon: Ralene Ok, MD;  Location: [place];  Service: General;  Laterality: Right;   INSERT / REPLACE / REMOVE PACEMAKER     MDT ADDRL1 pacemaker implanted by Dr Lovena Le for complete heart block   PERMANENT PACEMAKER INSERTION N/A 03/15/2013   Procedure: PERMANENT PACEMAKER INSERTION;  Surgeon: Evans Lance, MD;   Location[facility] CATH LAB;  Service: Cardiovascular;  Laterality: N/A;    Current Outpatient Medications  Medication Sig Dispense Refill   acetaminophen (TYLENOL) 325 MG tablet Take 650 mg by mouth every 6 (six) hours as needed for moderate pain.     apixaban (ELIQUIS) 5 MG TABS tablet Take 1 tablet (5 mg total) by mouth 2 (two) times daily. 180 tablet 3   dapagliflozin propanediol (FARXIGA) 10 MG TABS tablet Take 1 tablet by mouth daily. Absolute last refill please call [phone] to schedule follow up 30 tablet 11   diclofenac Sodium (VOLTAREN) 1 % GEL Apply 1 Application topically 4 (four) times daily as needed (pain).     digoxin (LANOXIN) 0.125 MG tablet Take 1 tablet by mouth once daily. 90 tablet 3   dofetilide (TIKOSYN) 500 MCG capsule Take 1 capsule by mouth 2 times daily. 60 capsule 3   furosemide (LASIX) 20 MG tablet TAKE 1 TABLET (20 MG TOTAL) 2 (TWO) TIMES DAILY BY MOUTH. 180 tablet 3   hydrALAZINE (APRESOLINE) 25 MG tablet Take 1/2 tablet by mouth 3 times daily. 405 tablet 0   isosorbide mononitrate (IMDUR) 30 MG 24 hr tablet Take 1 tablet (30 mg total) by mouth daily. 30 tablet 7   Multiple Vitamins-Minerals (CERTAVITE/ANTIOXIDANTS) TABS Take 1 tablet by mouth daily with breakfast.     oxymetazoline (AFRIN) 0.05 % nasal spray Place 1 spray into both nostrils 2 (two) times daily as needed for congestion.     potassium chloride (KLOR-CON) 10 MEQ tablet TAKE 1 TABLET BY MOUTH 2 TIMES DAILY. 180 tablet 2   sacubitril-valsartan (ENTRESTO) 24-26 MG Take 1 tablet by mouth 2 (two) times daily. 60 tablet 11   simvastatin (ZOCOR) 40 MG tablet TAKE 1 TABLET BY MOUTH EVERY DAY 90 tablet 3   spironolactone (ALDACTONE) 25 MG tablet TAKE 1 TABLET BY MOUTH ONCE A DAY 90 tablet 3   No current facility-administered medications for this visit.    Allergies:   Patient has no known allergies.   Social History: Social History   Socioeconomic History   Marital status: Married    Spouse name:  Philip Chavez   Number of children: 2   Years of education: Not on file   Highest education level: Not on file  Occupational History   Not on file  Tobacco Use   Smoking status: Former    Packs/day: 1.50    Years: 10.00    Total pack years: 15.00    Types: Cigarettes    Quit date: 02/04/1982    Years since quitting: 39.8    Passive exposure: Past   Smokeless tobacco: Never   Tobacco comments:    09/02/2014  "quit smoking 20-30 yr ago"  Vaping Use   Vaping Use: Never used  Substance and Sexual Activity   Alcohol use: No   Drug use: No   Sexual activity: Yes  Other Topics Concern   Not on file  Social History Narrative   Not on file   Social Determinants of Health   Financial Resource Strain: Not on file  Food Insecurity: No Food Insecurity (08/30/2021)  Hunger Vital Sign    Worried About Running Out of Food in the Last Year: Never true    Ran Out of Food in the Last Year: Never true  Transportation Needs: No Transportation Needs (08/30/2021)   PRAPARE - Hydrologist (Medical): No    Lack of Transportation (Non-Medical): No  Physical Activity: Not on file  Stress: Not on file  Social Connections: Not on file  Intimate Partner Violence: Not on file    Family History: Family History  Problem Relation Age of Onset   Diabetes type II Mother    Heart disease Father    Arrhythmia Father    Arrhythmia Sister    Hypertension Neg Hx    Heart attack Neg Hx    Stroke Neg Hx     Review of Systems: All other systems reviewed and are otherwise negative except as noted above.   Physical Exam: There were no vitals filed for this visit.   GEN- The patient is well appearing, alert and oriented x 3 today.   HEENT: normocephalic, atraumatic; sclera clear, conjunctiva pink; hearing intact; oropharynx clear; neck supple, no JVP Lymph- no cervical lymphadenopathy Lungs- Clear to ausculation bilaterally, normal work of breathing.  No wheezes, rales,  rhonchi Heart- {Blank single:19197::"Regular","Irregularly irregular"}  rate and rhythm, no murmurs, rubs or gallops, PMI not laterally displaced GI- soft, non-tender, non-distended, bowel sounds present, no hepatosplenomegaly Extremities- no clubbing or cyanosis. {EDEMA LOVFI:43329} peripheral edema; DP/PT/radial pulses 2+ bilaterally MS- no significant deformity or atrophy Skin- warm and dry, no rash or lesion; ICD pocket well healed Psych- euthymic mood, full affect Neuro- strength and sensation are intact  ICD interrogation- reviewed in detail today,  See PACEART report  EKG:  EKG {ACTION; IS/IS JJO:[id number]} ordered today. Personal review of EKG ordered {Blank single:19197::"today","***"} shows ***  Recent Labs: 05/03/2021: ALT 20 08/17/2021: Hemoglobin 14.0; Platelets 181 11/16/2021: BUN 14; Creatinine, Ser 1.16; Magnesium 2.0; Potassium 4.1; Sodium 137   Wt Readings from Last 3 Encounters:  11/21/21 196 lb (88.9 kg)  11/16/21 196 lb 12.8 oz (89.3 kg)  10/30/21 195 lb 0.6 oz (88.5 kg)     Other studies Reviewed: Additional studies/ records that were reviewed today include: Previous EP office notes.   Assessment and Plan:  1.  Chronic systolic dysfunction s/p Medtronic CRT-D  euvolemic today Stable on an appropriate medical regimen Normal ICD function See Pace Art report No changes today  2. VT ***  3. CHB  Initially had PPM but underwent ICD upgrade as above Normal function  4. Persistent AF Labs today on Tikosyn EKG shows *** With VT on device ***  Current medicines are reviewed at length with the patient today.   =  Labs/ tests ordered today include: *** No orders of the defined types were placed in this encounter.    Disposition:   Follow up with {EPMDS:28135} {Blank single:19197::"in 2 weeks","in 4 weeks","in 3 months","in 6 months","in 12 months","as usual post gen change"}    Signed, Shirley Friar, PA-C  11/26/2021 9:52 AM  [facility]  [facility] [address] [place] [address] [phone] (office) [phone] (fax)

## 2021-11-28 ENCOUNTER — Other Ambulatory Visit: Payer: Self-pay

## 2021-11-28 ENCOUNTER — Ambulatory Visit (HOSPITAL_BASED_OUTPATIENT_CLINIC_OR_DEPARTMENT_OTHER): Payer: Medicare PPO | Admitting: Registered Nurse

## 2021-11-28 ENCOUNTER — Ambulatory Visit (HOSPITAL_COMMUNITY)
Admission: RE | Admit: 2021-11-28 | Discharge: 2021-11-28 | Disposition: A | Discharge status: Home / Self Care | Payer: Medicare PPO | Attending: Gastroenterology | Admitting: Gastroenterology

## 2021-11-28 ENCOUNTER — Ambulatory Visit (HOSPITAL_COMMUNITY): Payer: Medicare PPO | Admitting: Registered Nurse

## 2021-11-28 ENCOUNTER — Encounter (HOSPITAL_COMMUNITY)
Admission: RE | Disposition: A | Discharge status: Home / Self Care | Payer: Self-pay | Source: Home / Self Care | Attending: Gastroenterology

## 2021-11-28 DIAGNOSIS — I428 Other cardiomyopathies: Secondary | ICD-10-CM | POA: Insufficient documentation

## 2021-11-28 DIAGNOSIS — K640 First degree hemorrhoids: Secondary | ICD-10-CM

## 2021-11-28 DIAGNOSIS — D124 Benign neoplasm of descending colon: Secondary | ICD-10-CM | POA: Diagnosis not present

## 2021-11-28 DIAGNOSIS — I509 Heart failure, unspecified: Secondary | ICD-10-CM

## 2021-11-28 DIAGNOSIS — I442 Atrioventricular block, complete: Secondary | ICD-10-CM | POA: Diagnosis not present

## 2021-11-28 DIAGNOSIS — D128 Benign neoplasm of rectum: Secondary | ICD-10-CM | POA: Diagnosis not present

## 2021-11-28 DIAGNOSIS — Z86010 Personal history of colonic polyps: Secondary | ICD-10-CM

## 2021-11-28 DIAGNOSIS — G4730 Sleep apnea, unspecified: Secondary | ICD-10-CM | POA: Insufficient documentation

## 2021-11-28 DIAGNOSIS — Z950 Presence of cardiac pacemaker: Secondary | ICD-10-CM | POA: Insufficient documentation

## 2021-11-28 DIAGNOSIS — Z1211 Encounter for screening for malignant neoplasm of colon: Secondary | ICD-10-CM | POA: Diagnosis not present

## 2021-11-28 DIAGNOSIS — Z87891 Personal history of nicotine dependence: Secondary | ICD-10-CM

## 2021-11-28 DIAGNOSIS — D122 Benign neoplasm of ascending colon: Secondary | ICD-10-CM | POA: Diagnosis not present

## 2021-11-28 DIAGNOSIS — K5730 Diverticulosis of large intestine without perforation or abscess without bleeding: Secondary | ICD-10-CM | POA: Insufficient documentation

## 2021-11-28 DIAGNOSIS — I110 Hypertensive heart disease with heart failure: Secondary | ICD-10-CM

## 2021-11-28 DIAGNOSIS — K635 Polyp of colon: Secondary | ICD-10-CM | POA: Diagnosis not present

## 2021-11-28 DIAGNOSIS — D759 Disease of blood and blood-forming organs, unspecified: Secondary | ICD-10-CM | POA: Diagnosis not present

## 2021-11-28 DIAGNOSIS — Z09 Encounter for follow-up examination after completed treatment for conditions other than malignant neoplasm: Secondary | ICD-10-CM | POA: Diagnosis not present

## 2021-11-28 DIAGNOSIS — J449 Chronic obstructive pulmonary disease, unspecified: Secondary | ICD-10-CM | POA: Diagnosis not present

## 2021-11-28 DIAGNOSIS — Q438 Other specified congenital malformations of intestine: Secondary | ICD-10-CM | POA: Insufficient documentation

## 2021-11-28 DIAGNOSIS — Z860101 Personal history of adenomatous and serrated colon polyps: Secondary | ICD-10-CM

## 2021-11-28 HISTORY — PX: SUBMUCOSAL TATTOO INJECTION: SHX6856

## 2021-11-28 HISTORY — PX: COLONOSCOPY WITH PROPOFOL: SHX5780

## 2021-11-28 HISTORY — PX: POLYPECTOMY: SHX5525

## 2021-11-28 HISTORY — PX: SCLEROTHERAPY: SHX6841

## 2021-11-28 SURGERY — COLONOSCOPY WITH PROPOFOL
Anesthesia: Monitor Anesthesia Care

## 2021-11-28 MED ORDER — PHENYLEPHRINE 80 MCG/ML (10ML) SYRINGE FOR IV PUSH (FOR BLOOD PRESSURE SUPPORT)
PREFILLED_SYRINGE | INTRAVENOUS | Status: DC | PRN
  Administered 2021-11-28: 80 ug via INTRAVENOUS
  Administered 2021-11-28 (×2): 160 ug via INTRAVENOUS
  Administered 2021-11-28: 80 ug via INTRAVENOUS
  Administered 2021-11-28: 160 ug via INTRAVENOUS
  Administered 2021-11-28: 80 ug via INTRAVENOUS

## 2021-11-28 MED ORDER — LACTATED RINGERS IV SOLN: INTRAVENOUS | Status: DC

## 2021-11-28 MED ORDER — PROPOFOL 500 MG/50ML IV EMUL
INTRAVENOUS | Status: AC
  Filled 2021-11-28: qty 50

## 2021-11-28 MED ORDER — SODIUM CHLORIDE 0.9 % IV SOLN: INTRAVENOUS | Status: DC

## 2021-11-28 MED ORDER — SPOT INK MARKER SYRINGE KIT
PACK | SUBMUCOSAL | Status: DC | PRN
  Administered 2021-11-28: 3 mL via SUBMUCOSAL

## 2021-11-28 MED ORDER — PROPOFOL 500 MG/50ML IV EMUL
INTRAVENOUS | Status: DC | PRN
  Administered 2021-11-28: 140 ug/kg/min via INTRAVENOUS
  Administered 2021-11-28: 20 mg via INTRAVENOUS

## 2021-11-28 MED ORDER — EPHEDRINE SULFATE-NACL 50-0.9 MG/10ML-% IV SOSY
PREFILLED_SYRINGE | INTRAVENOUS | Status: DC | PRN
  Administered 2021-11-28: 5 mg via INTRAVENOUS

## 2021-11-28 MED ORDER — PROPOFOL 10 MG/ML IV BOLUS
INTRAVENOUS | Status: AC
  Filled 2021-11-28: qty 20

## 2021-11-28 MED ORDER — SODIUM CHLORIDE (PF) 0.9 % IJ SOLN
PREFILLED_SYRINGE | INTRAMUSCULAR | Status: DC | PRN
  Administered 2021-11-28: 4 mL

## 2021-11-28 SURGICAL SUPPLY — 22 items

## 2021-11-28 NOTE — Transfer of Care (Signed)
Immediate Anesthesia Transfer of Care Note  Patient: Philip Chavez  Procedure(s) Performed: COLONOSCOPY WITH PROPOFOL  Patient Location: PACU  Anesthesia Type:MAC  Level of Consciousness: drowsy  Airway & Oxygen Therapy: Patient Spontanous Breathing and Patient connected to face mask oxygen  Post-op Assessment: Report given to RN  Post vital signs: Reviewed and stable  Last Vitals:  Vitals Value Taken Time  BP 95/62   Temp    Pulse 75   Resp 12   SpO2 100     Last Pain:  Vitals:   11/28/21 1204  TempSrc: Oral  PainSc: 0-No pain         Complications: No notable events documented.

## 2021-11-28 NOTE — Discharge Instructions (Addendum)
YOU HAD AN ENDOSCOPIC PROCEDURE TODAY: Refer to the procedure report and other information in the discharge instructions given to you for any specific questions about what was found during the examination. If this information does not answer your questions, please call [facility] office at [phone] to clarify.   YOU SHOULD EXPECT: Some feelings of bloating in the abdomen. Passage of more gas than usual. Walking can help get rid of the air that was put into your GI tract during the procedure and reduce the bloating. If you had a lower endoscopy (such as a colonoscopy or flexible sigmoidoscopy) you may notice spotting of blood in your stool or on the toilet paper. Some abdominal soreness may be present for a day or two, also.  DIET: Your first meal following the procedure should be a light meal and then it is ok to progress to your normal diet. A half-sandwich or bowl of soup is an example of a good first meal. Heavy or fried foods are harder to digest and may make you feel nauseous or bloated. Drink plenty of fluids but you should avoid alcoholic beverages for 24 hours. If you had a esophageal dilation, please see attached instructions for diet.    ACTIVITY: Your care partner should take you home directly after the procedure. You should plan to take it easy, moving slowly for the rest of the day. You can resume normal activity the day after the procedure however YOU SHOULD NOT DRIVE, use power tools, machinery or perform tasks that involve climbing or major physical exertion for 24 hours (because of the sedation medicines used during the test).   SYMPTOMS TO REPORT IMMEDIATELY: A gastroenterologist can be reached at any hour. Please call [phone]  for any of the following symptoms:  Following lower endoscopy (colonoscopy, flexible sigmoidoscopy) Excessive amounts of blood in the stool  Significant tenderness, worsening of abdominal pains  Swelling of the abdomen that is new, acute  Fever of 100  or higher  Following upper endoscopy (EGD, EUS, ERCP, esophageal dilation) Vomiting of blood or coffee ground material  New, significant abdominal pain  New, significant chest pain or pain under the shoulder blades  Painful or persistently difficult swallowing  New shortness of breath  Black, tarry-looking or red, bloody stools  FOLLOW UP:  If any biopsies were taken you will be contacted by phone or by letter within the next 1-3 weeks. Call [phone]  if you have not heard about the biopsies in 3 weeks.  Please also call with any specific questions about appointments or follow up tests. YOU HAD AN ENDOSCOPIC PROCEDURE TODAY: Refer to the procedure report and other information in the discharge instructions given to you for any specific questions about what was found during the examination. If this information does not answer your questions, please call [facility] office at [phone] to clarify.   YOU SHOULD EXPECT: Some feelings of bloating in the abdomen. Passage of more gas than usual. Walking can help get rid of the air that was put into your GI tract during the procedure and reduce the bloating. If you had a lower endoscopy (such as a colonoscopy or flexible sigmoidoscopy) you may notice spotting of blood in your stool or on the toilet paper. Some abdominal soreness may be present for a day or two, also.  DIET: Your first meal following the procedure should be a light meal and then it is ok to progress to your normal diet. A half-sandwich or bowl of soup is an   example of a good first meal. Heavy or fried foods are harder to digest and may make you feel nauseous or bloated. Drink plenty of fluids but you should avoid alcoholic beverages for 24 hours. If you had a esophageal dilation, please see attached instructions for diet.    ACTIVITY: Your care partner should take you home directly after the procedure. You should plan to take it easy, moving slowly for the rest of the day. You can resume  normal activity the day after the procedure however YOU SHOULD NOT DRIVE, use power tools, machinery or perform tasks that involve climbing or major physical exertion for 24 hours (because of the sedation medicines used during the test).   SYMPTOMS TO REPORT IMMEDIATELY: A gastroenterologist can be reached at any hour. Please call [phone]  for any of the following symptoms:  Following lower endoscopy (colonoscopy, flexible sigmoidoscopy) Excessive amounts of blood in the stool  Significant tenderness, worsening of abdominal pains  Swelling of the abdomen that is new, acute  Fever of 100 or higher  Following upper endoscopy (EGD, EUS, ERCP, esophageal dilation) Vomiting of blood or coffee ground material  New, significant abdominal pain  New, significant chest pain or pain under the shoulder blades  Painful or persistently difficult swallowing  New shortness of breath  Black, tarry-looking or red, bloody stools  FOLLOW UP:  If any biopsies were taken you will be contacted by phone or by letter within the next 1-3 weeks. Call [phone]  if you have not heard about the biopsies in 3 weeks.  Please also call with any specific questions about appointments or follow up tests.   HOLD ELIQUIS for another 5 days (restart on Sunday December 02, 2021).

## 2021-11-28 NOTE — Anesthesia Preprocedure Evaluation (Signed)
Anesthesia Evaluation  Patient identified by MRN, date of birth, ID band Patient awake    Reviewed: Allergy & Precautions, NPO status , Patient's Chart, lab work & pertinent test results  History of Anesthesia Complications Negative for: history of anesthetic complications  Airway Mallampati: II  TM Distance: >3 FB Neck ROM: Full    Dental  (+) Teeth Intact, Dental Advisory Given   Pulmonary neg shortness of breath, sleep apnea , neg COPD, former smoker,    breath sounds clear to auscultation       Cardiovascular hypertension, +CHF  + dysrhythmias + pacemaker + Cardiac Defibrillator  Rhythm:Regular  Complete heart block   1. Left ventricular ejection fraction, by estimation, is 15-20%. The left  ventricle has severely decreased function. The left ventricle demonstrates  global hypokinesis. The left ventricular internal cavity size was severely  dilated. Left ventricular  diastolic parameters are consistent with Grade II diastolic dysfunction  (pseudonormalization).  2. Right ventricular systolic function is mildly reduced. The right  ventricular size is mildly enlarged. There is normal pulmonary artery  systolic pressure. The estimated right ventricular systolic pressure is  08.6 mmHg.  3. Left atrial size was mildly dilated.  4. The mitral valve is grossly normal. Mild to moderate mitral valve  regurgitation. No evidence of mitral stenosis.  5. The aortic valve is tricuspid. Aortic valve regurgitation is mild.  Aortic valve sclerosis is present, with no evidence of aortic valve  stenosis.  6. Aortic dilatation noted. There is mild dilatation of the aortic root,  measuring 41 mm.  7. The inferior vena cava is normal in size with greater than 50%  respiratory variability, suggesting right atrial pressure of 3 mmHg.    1. Near-normal left and right heart filling pressures.  Cardiac output low but not markedly low.   2. Mild nonobstructive coronary disease => nonischemic cardiomyopathy.   Patient will need CRT upgrade.     Neuro/Psych negative neurological ROS  negative psych ROS   GI/Hepatic Neg liver ROS,   Endo/Other    Renal/GU Renal diseaseLab Results      Component                Value               Date                      CREATININE               1.16                11/16/2021           Lab Results      Component                Value               Date                      K                        4.1                 11/16/2021                Musculoskeletal negative musculoskeletal ROS (+)   Abdominal   Peds  Hematology  (+) Blood dyscrasia, , Lab Results      Component  Value               Date                      WBC                      4.1                 08/17/2021                HGB                      14.0                08/17/2021                HCT                      42.6                08/17/2021                MCV                      93.2                08/17/2021                PLT                      181                 08/17/2021           '  eliquis   Anesthesia Other Findings   Reproductive/Obstetrics                             Anesthesia Physical Anesthesia Plan  ASA: 3  Anesthesia Plan: MAC   Post-op Pain Management: Minimal or no pain anticipated   Induction: Intravenous  PONV Risk Score and Plan: 1 and Propofol infusion and Treatment may vary due to age or medical condition  Airway Management Planned: Nasal Cannula and Natural Airway  Additional Equipment: None  Intra-op Plan:   Post-operative Plan:   Informed Consent: I have reviewed the patients History and Physical, chart, labs and discussed the procedure including the risks, benefits and alternatives for the proposed anesthesia with the patient or authorized representative who has indicated his/her understanding and acceptance.      Dental advisory given  Plan Discussed with: CRNA  Anesthesia Plan Comments:         Anesthesia Quick Evaluation

## 2021-11-28 NOTE — Op Note (Signed)
[facility] Patient Name: Philip Chavez Procedure Date: 11/28/2021 MRN: [id number] Attending MD: Lear Ng , MD, [id number] Date of Birth: October 12, 1950 CSN: [id number] Age: 71 Admit Type: Outpatient Procedure:                Colonoscopy Indications:              High risk colon cancer surveillance: Personal                            history of colonic polyps, Last colonoscopy:                            November 2015 Providers:                Lear Ng, MD, [address], [facility]                            Jerline Pain, Technician, Brien Mates, RNFA, Victoriano Lain, CRNA Referring MD:             Champ Mungo. Lovena Le, MD Medicines:                Propofol per Anesthesia, Monitored Anesthesia Care Complications:            No immediate complications. Estimated Blood Loss:     Estimated blood loss was minimal. Procedure:                Pre-Anesthesia Assessment:                           - Prior to the procedure, a History and Physical                            was performed, and patient medications and                            allergies were reviewed. The patient's tolerance of                            previous anesthesia was also reviewed. The risks                            and benefits of the procedure and the sedation                            options and risks were discussed with the patient.                            All questions were answered, and informed consent                            was obtained. Prior Anticoagulants: The patient has  taken Eliquis (apixaban), last dose was 2 days                            prior to procedure. ASA Grade Assessment: III - A                            patient with severe systemic disease. After                            reviewing the risks and benefits, the patient was                            deemed in satisfactory condition to undergo the                             procedure.                           After obtaining informed consent, the colonoscope                            was passed under direct vision. Throughout the                            procedure, the patient's blood pressure, pulse, and                            oxygen saturations were monitored continuously. The                            PCF-HQ190L ([id number]) Olympus colonoscope was                            introduced through the anus and advanced to the the                            cecum, identified by appendiceal orifice and                            ileocecal valve. The colonoscopy was performed with                            difficulty due to significant looping, a tortuous                            colon and fair prep. Successful completion of the                            procedure was aided by straightening and shortening                            the scope to obtain bowel loop reduction, using  scope torsion and lavage. The patient tolerated the                            procedure well. The quality of the bowel                            preparation was fair and fair but repeated                            irrigation led to a good and adequate prep. The                            terminal ileum, ileocecal valve, appendiceal                            orifice, and rectum were photographed. Technical                            issues prevented photodocumentation of the                            polypectomy site after tattooing. Scope In: 1:31:03 PM Scope Out: 7:61:60 PM Scope Withdrawal Time: 0 hours 58 minutes 45 seconds  Total Procedure Duration: 1 hour 5 minutes 15 seconds  Findings:      The perianal and digital rectal examinations were normal.      A greater than 50 mm polyp was found in the descending colon. The polyp       was pedunculated. The polyp was removed with a hot snare. Resection and        retrieval were complete. Estimated blood loss: none. Area was       successfully injected with 4 mL of a 0.1 mg/mL solution of epinephrine       for a lift polypectomy. Estimated blood loss: none. Area was tattooed       with an injection of 3 mL of Spot (carbon black). Epinephrine:saline       mixture was injected into the stalk prior to polypectomy to help with       hemostasis with snare cautery.      Three sessile and semi-sessile polyps were found in the ascending colon.       The polyps were 4 to 6 mm in size. These polyps were removed with a hot       snare. Resection and retrieval were complete. Estimated blood loss: none.      Two sessile and semi-sessile polyps were found in the rectum and       descending colon. The polyps were 4 to 6 mm in size. These polyps were       removed with a hot snare. Resection and retrieval were complete.       Estimated blood loss: none.      A few small-mouthed diverticula were found in the sigmoid colon.      Internal hemorrhoids were found during retroflexion. The hemorrhoids       were large and Grade I (internal hemorrhoids that do not prolapse).      The terminal ileum appeared normal. Impression:               -  Preparation of the colon was fair.                           - One greater than 50 mm polyp in the descending                            colon, removed with a hot snare. Resected and                            retrieved. Injected. Tattooed.                           - Three 4 to 6 mm polyps in the ascending colon,                            removed with a hot snare. Resected and retrieved.                           - Two 4 to 6 mm polyps in the rectum and in the                            descending colon, removed with a hot snare.                            Resected and retrieved.                           - Diverticulosis in the sigmoid colon.                           - Internal hemorrhoids.                           - The  examined portion of the ileum was normal. Moderate Sedation:      N/A - MAC procedure Recommendation:           - Patient has a contact number available for                            emergencies. The signs and symptoms of potential                            delayed complications were discussed with the                            patient. Return to normal activities tomorrow.                            Written discharge instructions were provided to the                            patient.                           - High fiber  diet.                           - Resume Eliquis (apixaban) at prior dose in 5 days.                           - Await pathology results. Procedure Code(s):        --- Professional ---                           [phone], Colonoscopy, flexible; with removal of                            tumor(s), polyp(s), or other lesion(s) by snare                            technique                           [id number], Colonoscopy, flexible; with directed                            submucosal injection(s), any substance Diagnosis Code(s):        --- Professional ---                           Z86.010, Personal history of colonic polyps                           D12.4, Benign neoplasm of descending colon                           D12.2, Benign neoplasm of ascending colon                           D12.8, Benign neoplasm of rectum                           K64.0, First degree hemorrhoids                           K57.30, Diverticulosis of large intestine without                            perforation or abscess without bleeding CPT copyright 2022 [employer]. All rights reserved. The codes documented in this report are preliminary and upon coder review may  be revised to meet current compliance requirements. Lear Ng, MD 11/28/2021 2:52:49 PM This report has been signed electronically. Number of Addenda: 0

## 2021-11-28 NOTE — Anesthesia Postprocedure Evaluation (Signed)
Anesthesia Post Note  Patient: Philip Chavez  Procedure(s) Performed: COLONOSCOPY WITH PROPOFOL     Patient location during evaluation: Endoscopy Anesthesia Type: MAC Level of consciousness: awake and alert Pain management: pain level controlled Vital Signs Assessment: post-procedure vital signs reviewed and stable Respiratory status: spontaneous breathing, nonlabored ventilation and respiratory function stable Cardiovascular status: stable and blood pressure returned to baseline Postop Assessment: no apparent nausea or vomiting Anesthetic complications: no   No notable events documented.  Last Vitals:  Vitals:   11/28/21 1502 11/28/21 1512  BP: 90/60 102/60  Pulse: 78 70  Resp: 20 19  Temp:    SpO2: 99% 100%    Last Pain:  Vitals:   11/28/21 1512  TempSrc:   PainSc: 0-No pain                 Khaliah Barnick

## 2021-11-28 NOTE — H&P (Signed)
Date of Initial H&P: 11/15/21  History reviewed, patient examined, no change in status, stable for surgery.   

## 2021-11-28 NOTE — Interval H&P Note (Signed)
History and Physical Interval Note:  11/28/2021 1:21 PM  Philip Chavez  has presented today for surgery, with the diagnosis of History of colon polyps.  The various methods of treatment have been discussed with the patient and family. After consideration of risks, benefits and other options for treatment, the patient has consented to  Procedure(s): COLONOSCOPY WITH PROPOFOL (N/A) as a surgical intervention.  The patient's history has been reviewed, patient examined, no change in status, stable for surgery.  I have reviewed the patient's chart and labs.  Questions were answered to the patient's satisfaction.     Lear Ng

## 2021-11-29 LAB — SURGICAL PATHOLOGY

## 2021-11-30 ENCOUNTER — Encounter (HOSPITAL_COMMUNITY): Payer: Self-pay | Admitting: Gastroenterology

## 2021-12-03 ENCOUNTER — Emergency Department (HOSPITAL_COMMUNITY): Payer: Medicare PPO

## 2021-12-03 ENCOUNTER — Other Ambulatory Visit: Payer: Self-pay

## 2021-12-03 ENCOUNTER — Inpatient Hospital Stay (HOSPITAL_COMMUNITY)
Admission: EM | Admit: 2021-12-03 | Discharge: 2021-12-06 | DRG: 287 | Disposition: A | Discharge status: Home / Self Care | Payer: Medicare PPO | Attending: Cardiology | Admitting: Cardiology

## 2021-12-03 ENCOUNTER — Inpatient Hospital Stay (HOSPITAL_COMMUNITY): Payer: Medicare PPO

## 2021-12-03 ENCOUNTER — Encounter (HOSPITAL_COMMUNITY): Payer: Self-pay | Admitting: Emergency Medicine

## 2021-12-03 DIAGNOSIS — Z86711 Personal history of pulmonary embolism: Secondary | ICD-10-CM | POA: Diagnosis not present

## 2021-12-03 DIAGNOSIS — I499 Cardiac arrhythmia, unspecified: Secondary | ICD-10-CM | POA: Diagnosis not present

## 2021-12-03 DIAGNOSIS — Z7901 Long term (current) use of anticoagulants: Secondary | ICD-10-CM | POA: Diagnosis not present

## 2021-12-03 DIAGNOSIS — Z8249 Family history of ischemic heart disease and other diseases of the circulatory system: Secondary | ICD-10-CM | POA: Diagnosis not present

## 2021-12-03 DIAGNOSIS — I959 Hypotension, unspecified: Secondary | ICD-10-CM | POA: Diagnosis not present

## 2021-12-03 DIAGNOSIS — G4730 Sleep apnea, unspecified: Secondary | ICD-10-CM | POA: Diagnosis present

## 2021-12-03 DIAGNOSIS — I509 Heart failure, unspecified: Secondary | ICD-10-CM | POA: Diagnosis not present

## 2021-12-03 DIAGNOSIS — I2510 Atherosclerotic heart disease of native coronary artery without angina pectoris: Secondary | ICD-10-CM | POA: Diagnosis present

## 2021-12-03 DIAGNOSIS — D6859 Other primary thrombophilia: Secondary | ICD-10-CM | POA: Diagnosis not present

## 2021-12-03 DIAGNOSIS — I428 Other cardiomyopathies: Secondary | ICD-10-CM | POA: Diagnosis present

## 2021-12-03 DIAGNOSIS — I480 Paroxysmal atrial fibrillation: Secondary | ICD-10-CM | POA: Diagnosis present

## 2021-12-03 DIAGNOSIS — I10 Essential (primary) hypertension: Secondary | ICD-10-CM | POA: Diagnosis not present

## 2021-12-03 DIAGNOSIS — I472 Ventricular tachycardia, unspecified: Principal | ICD-10-CM | POA: Diagnosis present

## 2021-12-03 DIAGNOSIS — I130 Hypertensive heart and chronic kidney disease with heart failure and stage 1 through stage 4 chronic kidney disease, or unspecified chronic kidney disease: Secondary | ICD-10-CM | POA: Diagnosis not present

## 2021-12-03 DIAGNOSIS — I4901 Ventricular fibrillation: Secondary | ICD-10-CM | POA: Diagnosis present

## 2021-12-03 DIAGNOSIS — Z79899 Other long term (current) drug therapy: Secondary | ICD-10-CM | POA: Diagnosis not present

## 2021-12-03 DIAGNOSIS — N179 Acute kidney failure, unspecified: Secondary | ICD-10-CM | POA: Diagnosis present

## 2021-12-03 DIAGNOSIS — Z4502 Encounter for adjustment and management of automatic implantable cardiac defibrillator: Principal | ICD-10-CM

## 2021-12-03 DIAGNOSIS — R042 Hemoptysis: Secondary | ICD-10-CM | POA: Diagnosis present

## 2021-12-03 DIAGNOSIS — E7800 Pure hypercholesterolemia, unspecified: Secondary | ICD-10-CM | POA: Diagnosis present

## 2021-12-03 DIAGNOSIS — I517 Cardiomegaly: Secondary | ICD-10-CM | POA: Diagnosis not present

## 2021-12-03 DIAGNOSIS — Z87891 Personal history of nicotine dependence: Secondary | ICD-10-CM

## 2021-12-03 DIAGNOSIS — I5043 Acute on chronic combined systolic (congestive) and diastolic (congestive) heart failure: Secondary | ICD-10-CM | POA: Diagnosis not present

## 2021-12-03 DIAGNOSIS — Z86718 Personal history of other venous thrombosis and embolism: Secondary | ICD-10-CM | POA: Diagnosis not present

## 2021-12-03 DIAGNOSIS — Z95810 Presence of automatic (implantable) cardiac defibrillator: Secondary | ICD-10-CM

## 2021-12-03 DIAGNOSIS — I442 Atrioventricular block, complete: Secondary | ICD-10-CM | POA: Diagnosis present

## 2021-12-03 DIAGNOSIS — I5022 Chronic systolic (congestive) heart failure: Secondary | ICD-10-CM | POA: Diagnosis not present

## 2021-12-03 DIAGNOSIS — I491 Atrial premature depolarization: Secondary | ICD-10-CM | POA: Diagnosis not present

## 2021-12-03 DIAGNOSIS — N1831 Chronic kidney disease, stage 3a: Secondary | ICD-10-CM | POA: Diagnosis present

## 2021-12-03 DIAGNOSIS — T82897A Other specified complication of cardiac prosthetic devices, implants and grafts, initial encounter: Secondary | ICD-10-CM | POA: Diagnosis not present

## 2021-12-03 DIAGNOSIS — R000 Tachycardia, unspecified: Secondary | ICD-10-CM | POA: Diagnosis not present

## 2021-12-03 DIAGNOSIS — I2721 Secondary pulmonary arterial hypertension: Secondary | ICD-10-CM | POA: Diagnosis present

## 2021-12-03 LAB — COMPREHENSIVE METABOLIC PANEL
ALT: 20 U/L (ref 0–44)
AST: 20 U/L (ref 15–41)
Albumin: 3.4 g/dL — ABNORMAL LOW (ref 3.5–5.0)
Alkaline Phosphatase: 59 U/L (ref 38–126)
Anion gap: 12 (ref 5–15)
BUN: 15 mg/dL (ref 8–23)
CO2: 21 mmol/L — ABNORMAL LOW (ref 22–32)
Calcium: 8.7 mg/dL — ABNORMAL LOW (ref 8.9–10.3)
Chloride: 104 mmol/L (ref 98–111)
Creatinine, Ser: 1.46 mg/dL — ABNORMAL HIGH (ref 0.61–1.24)
GFR, Estimated: 51 mL/min — ABNORMAL LOW (ref >60)
Glucose, Bld: 138 mg/dL — ABNORMAL HIGH (ref 70–99)
Potassium: 3.8 mmol/L (ref 3.5–5.1)
Sodium: 137 mmol/L (ref 135–145)
Total Bilirubin: <0.1 mg/dL — ABNORMAL LOW (ref 0.3–1.2)
Total Protein: 6.5 g/dL (ref 6.5–8.1)

## 2021-12-03 LAB — TROPONIN I (HIGH SENSITIVITY)
Troponin I (High Sensitivity): 208 ng/L (ref <18)
Troponin I (High Sensitivity): 685 ng/L (ref <18)

## 2021-12-03 LAB — MAGNESIUM: Magnesium: 1.6 mg/dL — ABNORMAL LOW (ref 1.7–2.4)

## 2021-12-03 LAB — CBC
HCT: 38.5 % — ABNORMAL LOW (ref 39.0–52.0)
Hemoglobin: 12.7 g/dL — ABNORMAL LOW (ref 13.0–17.0)
MCH: 31.2 pg (ref 26.0–34.0)
MCHC: 33 g/dL (ref 30.0–36.0)
MCV: 94.6 fL (ref 80.0–100.0)
Platelets: 128 10*3/uL — ABNORMAL LOW (ref 150–400)
RBC: 4.07 MIL/uL — ABNORMAL LOW (ref 4.22–5.81)
RDW: 14.6 % (ref 11.5–15.5)
WBC: 5.2 10*3/uL (ref 4.0–10.5)
nRBC: 0 % (ref 0.0–0.2)

## 2021-12-03 LAB — ECHOCARDIOGRAM COMPLETE
Area-P 1/2: 7.16 cm2
Height: 73 in
P 1/2 time: 797 msec
S' Lateral: 6.9 cm
Weight: 3121.71 oz

## 2021-12-03 LAB — PROTIME-INR
INR: 1.3 — ABNORMAL HIGH (ref 0.8–1.2)
Prothrombin Time: 16.3 seconds — ABNORMAL HIGH (ref 11.4–15.2)

## 2021-12-03 LAB — DIGOXIN LEVEL: Digoxin Level: 0.3 ng/mL — ABNORMAL LOW (ref 0.8–2.0)

## 2021-12-03 MED ORDER — BISOPROLOL FUMARATE 5 MG PO TABS
2.5000 mg | ORAL_TABLET | Freq: Every day | ORAL | Status: DC
  Administered 2021-12-03 – 2021-12-05 (×3): 2.5 mg via ORAL
  Filled 2021-12-03 (×2): qty 0.5
  Filled 2021-12-03: qty 1

## 2021-12-03 MED ORDER — PERFLUTREN LIPID MICROSPHERE
1.0000 mL | INTRAVENOUS | Status: AC | PRN
  Administered 2021-12-03: 5 mL via INTRAVENOUS

## 2021-12-03 MED ORDER — SIMVASTATIN 20 MG PO TABS
40.0000 mg | ORAL_TABLET | Freq: Every day | ORAL | Status: DC
  Administered 2021-12-03 – 2021-12-05 (×3): 40 mg via ORAL
  Filled 2021-12-03 (×3): qty 2

## 2021-12-03 MED ORDER — SODIUM CHLORIDE 0.9% FLUSH
3.0000 mL | Freq: Two times a day (BID) | INTRAVENOUS | Status: DC
  Administered 2021-12-03 – 2021-12-05 (×5): 3 mL via INTRAVENOUS

## 2021-12-03 MED ORDER — ACETAMINOPHEN 325 MG PO TABS: 650.0000 mg | ORAL_TABLET | ORAL | Status: DC | PRN

## 2021-12-03 MED ORDER — ACETAMINOPHEN 325 MG PO TABS: 650.0000 mg | ORAL_TABLET | Freq: Four times a day (QID) | ORAL | Status: DC | PRN

## 2021-12-03 MED ORDER — POTASSIUM CHLORIDE CRYS ER 10 MEQ PO TBCR
10.0000 meq | EXTENDED_RELEASE_TABLET | Freq: Two times a day (BID) | ORAL | Status: DC
  Administered 2021-12-04 – 2021-12-05 (×4): 10 meq via ORAL
  Filled 2021-12-03 (×4): qty 1

## 2021-12-03 MED ORDER — SODIUM CHLORIDE 0.9 % IV BOLUS
500.0000 mL | Freq: Once | INTRAVENOUS | Status: AC
  Administered 2021-12-03: 500 mL via INTRAVENOUS

## 2021-12-03 MED ORDER — MAGNESIUM SULFATE 2 GM/50ML IV SOLN
2.0000 g | Freq: Once | INTRAVENOUS | Status: AC
  Administered 2021-12-03: 2 g via INTRAVENOUS
  Filled 2021-12-03: qty 50

## 2021-12-03 MED ORDER — POTASSIUM CHLORIDE CRYS ER 10 MEQ PO TBCR
10.0000 meq | EXTENDED_RELEASE_TABLET | Freq: Two times a day (BID) | ORAL | Status: DC
  Filled 2021-12-03: qty 1

## 2021-12-03 MED ORDER — DAPAGLIFLOZIN PROPANEDIOL 10 MG PO TABS
10.0000 mg | ORAL_TABLET | Freq: Every day | ORAL | Status: DC
  Administered 2021-12-03 – 2021-12-05 (×3): 10 mg via ORAL
  Filled 2021-12-03 (×3): qty 1

## 2021-12-03 MED ORDER — AMIODARONE HCL IN DEXTROSE 360-4.14 MG/200ML-% IV SOLN: 60.0000 mg/h | INTRAVENOUS | Status: DC

## 2021-12-03 MED ORDER — SODIUM CHLORIDE 0.9% FLUSH: 3.0000 mL | INTRAVENOUS | Status: DC | PRN

## 2021-12-03 MED ORDER — AMIODARONE HCL IN DEXTROSE 360-4.14 MG/200ML-% IV SOLN: 30.0000 mg/h | INTRAVENOUS | Status: DC

## 2021-12-03 MED ORDER — ONDANSETRON HCL 4 MG/2ML IJ SOLN: 4.0000 mg | Freq: Four times a day (QID) | INTRAMUSCULAR | Status: DC | PRN

## 2021-12-03 MED ORDER — SODIUM CHLORIDE 0.9 % IV SOLN: 250.0000 mL | INTRAVENOUS | Status: DC | PRN

## 2021-12-03 MED ORDER — POTASSIUM CHLORIDE CRYS ER 20 MEQ PO TBCR
40.0000 meq | EXTENDED_RELEASE_TABLET | Freq: Once | ORAL | Status: AC
  Administered 2021-12-03: 40 meq via ORAL
  Filled 2021-12-03: qty 2

## 2021-12-03 MED ORDER — SACUBITRIL-VALSARTAN 24-26 MG PO TABS
1.0000 | ORAL_TABLET | Freq: Two times a day (BID) | ORAL | Status: DC
  Administered 2021-12-03 – 2021-12-05 (×6): 1 via ORAL
  Filled 2021-12-03 (×6): qty 1

## 2021-12-03 MED ORDER — APIXABAN 5 MG PO TABS
5.0000 mg | ORAL_TABLET | Freq: Two times a day (BID) | ORAL | Status: DC
  Administered 2021-12-03 – 2021-12-05 (×6): 5 mg via ORAL
  Filled 2021-12-03 (×6): qty 1

## 2021-12-03 MED ORDER — AMIODARONE LOAD VIA INFUSION
150.0000 mg | Freq: Once | INTRAVENOUS | Status: DC
  Filled 2021-12-03: qty 83.34

## 2021-12-03 MED ORDER — SPIRONOLACTONE 25 MG PO TABS
25.0000 mg | ORAL_TABLET | Freq: Every day | ORAL | Status: DC
  Administered 2021-12-03 – 2021-12-05 (×3): 25 mg via ORAL
  Filled 2021-12-03 (×3): qty 1

## 2021-12-03 NOTE — Progress Notes (Addendum)
Advanced Heart Failure Rounding Note  PCP-Cardiologist: None   Subjective:    Feels ok currently. No chest pain or dyspnea.   NSR on tele.   Mg 1.6, K 3.3. Got 2 g Mg supp. Additional 2 g ordered + KCl    Objective:   Weight Range:   There is no height or weight on file to calculate BMI.   Vital Signs:   Temp:  [99 F (37.2 C)-99.2 F (37.3 C)] 99 F (37.2 C) (10/30 0729) Pulse Rate:  [76-94] 76 (10/30 0830) Resp:  [13-34] 13 (10/30 0830) BP: (84-100)/(53-71) 91/57 (10/30 0830) SpO2:  [95 %-100 %] 95 % (10/30 0830)    Weight change: There were no vitals filed for this visit.  Intake/Output:  No intake or output data in the 24 hours ending 12/03/21 0902    Physical Exam    General:  Well appearing. No resp difficulty HEENT: Normal Neck: Supple. JVP 7 cm . Carotids 2+ bilat; no bruits. No lymphadenopathy or thyromegaly appreciated. Cor: PMI nondisplaced. Regular rate & rhythm. No rubs, gallops or murmurs. + Zoll pads  Lungs: Clear Abdomen: Soft, nontender, nondistended. No hepatosplenomegaly. No bruits or masses. Good bowel sounds. Extremities: No cyanosis, clubbing, rash, edema Neuro: Alert & orientedx3, cranial nerves grossly intact. moves all 4 extremities w/o difficulty. Affect pleasant   Telemetry   NSR w/ PACs 80s   EKG    SR w/ PACs 87 bpm, IVCD, prolonged QTc 562 ms  Labs    CBC Recent Labs    12/03/21 0308  WBC 5.2  HGB 12.7*  HCT 38.5*  MCV 94.6  PLT 426*   Basic Metabolic Panel Recent Labs    12/03/21 0308  NA 137  K 3.8  CL 104  CO2 21*  GLUCOSE 138*  BUN 15  CREATININE 1.46*  CALCIUM 8.7*  MG 1.6*   Liver Function Tests Recent Labs    12/03/21 0308  AST 20  ALT 20  ALKPHOS 59  BILITOT <0.1*  PROT 6.5  ALBUMIN 3.4*   No results for input(s): "LIPASE", "AMYLASE" in the last 72 hours. Cardiac Enzymes No results for input(s): "CKTOTAL", "CKMB", "CKMBINDEX", "TROPONINI" in the last 72 hours.  BNP: BNP (last  3 results) No results for input(s): "BNP" in the last 8760 hours.  ProBNP (last 3 results) No results for input(s): "PROBNP" in the last 8760 hours.   D-Dimer No results for input(s): "DDIMER" in the last 72 hours. Hemoglobin A1C No results for input(s): "HGBA1C" in the last 72 hours. Fasting Lipid Panel No results for input(s): "CHOL", "HDL", "LDLCALC", "TRIG", "CHOLHDL", "LDLDIRECT" in the last 72 hours. Thyroid Function Tests No results for input(s): "TSH", "T4TOTAL", "T3FREE", "THYROIDAB" in the last 72 hours.  Invalid input(s): "FREET3"  Other results:   Imaging    DG Chest Port 1 View  Result Date: 12/03/2021 CLINICAL DATA:  ICD placement. EXAM: PORTABLE CHEST 1 VIEW COMPARISON:  04/30/2016. FINDINGS: The heart is enlarged and the mediastinal contour is stable. A multi lead pacemaker device is present over the left chest. Stable scarring or atelectasis at the right lung base. No consolidation, effusion, or pneumothorax. No acute osseous abnormality. IMPRESSION: 1. Stable atelectasis or scarring at the right lung base. 2. Cardiomegaly. Electronically Signed   By: Brett Fairy M.D.   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   Of note, he had a colonoscopy last week and had significant fluid loss with the prep. Mg 1.6 on admission, K 3.8. QT prolonged.   Assessment/Plan   1. VT  Storm/ ICD Shock  - in setting of chronic systolic HF + Tikosyn use for AFib + hypomagnesemia  - not all strips are included with the report but it looks like he received 14 total ICD shocks in VF zone  - QT prolonged, 562 ms, Mg low 1.6, suspect related to recent bowel prep for colonoscopy  - Supp Mg and K, Keep Mg > 2.0 and K > 4.0  - Hold Tikosyn and digoxin for now - EP to see   2. Chronic Systolic Heart Failure - NICM, EF 15% on echo in 8/16.  Patient had borderline reduced EF when PPM was placed in 2/15.  After that, EF fell significantly.  He has a history of complete heart block and father had CHF and CHB with pacemaker, and sounds like sister also had CHB with pacemaker. 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  He had CRT-D upgrade.  No significant CAD on last cath.  Echo in 4/17 showed improvement in EF to 30-35%.  However, his LV lead became dislodged and he is no longer BiV pacing, now RV pacing > 99% of the time.  Given minimal symptoms, decision was made not to revise his LV lead. Echo in 5/18 showed EF down to 20%.  Echo in 12/21 shows EF < 20% and moderately dilated.  - NYHA Class II. Euvolemic on exam  - continue Farxiga 10 mg daily  - continue Entresto 24-26 mg bid - continue spironolactone 25 mg daily   3. PAF - on Tikosyn, PTA. Holding insetting of VT - on Eliquis - EP to see and weigh in regarding AAD, suspect will need transition to amiodarone   4. Elevated Trop - Hs trop 208, suspect demand ischemia in setting of VT  - no ischemic CP   5. Stage IIIa CKD - SCr 1.46, close to b/l - follow BMP    Length of Stay: 0  Brittainy Simmons, PA-C  12/03/2021, 9:02 AM  Advanced Heart Failure Team Pager [phone] (M-F; 7a - 5p)  Please contact [facility] for night-coverage after hours (5p -7a ) and weekends on [email or web address]  Patient seen with PA, agree with the above note.   Patient had colonoscopy last week with fluid  loss for that, continued all cardiac meds.  Last night, had 14 shocks for VF.  Mg was low at 1.6.  He has been on Tikosyn with baseline borderline QTc. QTc on today's ECG was 562 msec.   Patient denies significant exertional dyspnea.  No chest pain.  Mild HS-TnI elevation around 200 in setting of multiple shocks.    General: NAD Neck: No JVD, no thyromegaly or thyroid nodule.  Lungs: Clear to auscultation bilaterally with  normal respiratory effort. CV: Nondisplaced PMI.  Heart regular S1/S2, no S3/S4, no murmur.  No peripheral edema.   Abdomen: Soft, nontender, no hepatosplenomegaly, no distention.  Skin: Intact without lesions or rashes.  Neurologic: Alert and oriented x 3.  Psych: Normal affect. Extremities: No clubbing or cyanosis.  HEENT: Normal.   Suspect VT/VF in setting of Tikosyn use with electrolyte abnormalities post-colonoscopy, baseline QTc is borderline for Tikosyn use. Doubt ACS, elevated troponin is likely due to multiple shocks.  - Agree with stopping Tikosyn, will use amiodarone instead.  Start load tomorrow after he has been off Tikosyn today.  - Replace Mg and K.   - Repeat troponin x 1 to make sure he does not have a marked uptrend.  - Will get echo.   Volume status looks ok on exam.  Creatinine stable. - Restart home Dunean, Farxiga, and spironolactone.  - Hold off on hydralazine/Imdur for now with soft BP.  - Would start him back on beta blocker, use bisoprolol 2.5 daily.  - Digoxin level ok, restart tomorrow.  - RV paces > 95% of the time, LV lead has been dislodged.  With minimal symptoms, have not re-attempted CRT.  Would like EP to consider re-attempting some form of CRT (CS lead versus left bundle lead).    Loralie Champagne 12/03/2021 11:46 AM

## 2021-12-03 NOTE — ED Notes (Signed)
Patient back from Echo.

## 2021-12-03 NOTE — ED Notes (Signed)
Patient transported to echo ?

## 2021-12-03 NOTE — ED Notes (Signed)
RN assisted patient to the bathroom.

## 2021-12-03 NOTE — ED Notes (Signed)
Pt resting comfortably in bed, denies pain at this time. Call bell is within reach and pt denies further needs at this time

## 2021-12-03 NOTE — ED Triage Notes (Signed)
Pt BIB [facility] from home, pt reports defibrillator fired 3 times tonight, called EMS and defibrillator fired 2 additional times in EMS presence. EMS reports short runs of vtach prior to defib firing, given '150mg'$  amiodarone and started on amiodarone drip. Pt c/o chest pain prior to defib firing, chest pain, pain resolved after. Denies current chest pain, shortness of breath, dizziness.

## 2021-12-03 NOTE — Consult Note (Signed)
ELECTROPHYSIOLOGY CONSULT NOTE    Patient ID: Philip Chavez MRN: [id number], DOB/AGE: [birth year] 71 y.o.  Admit date: 12/03/2021 Date of Consult: 12/03/2021  Primary Physician: Ezequiel Essex, MD Primary Cardiologist: None  Electrophysiologist: Dr. Lovena Le   Referring Provider: Dr. Aundra Dubin  Patient Profile: Philip Chavez is a 71 y.o. 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 Aundra Dubin.  HPI:  Philip Chavez is a 71 y.o. male who developed complete heart block in 03/2013 and had PPM placed at that time.  He paces his RV continuously.  In 03/2013, EF was 50-55% by echo.  By 07/2013, EF had fallen to 20-25%.  Cardiolite showed possible scar but no ischemia.  On 09/03/14, he was admitted with dyspnea and found to be in atrial flutter with RLL PNA and with volume overload.  Echo showed EF 15% with diffuse hypokinesis.  He was started on IV Lasix and IV amiodarone. He converted back to NSR.  He subsequently had atrial flutter ablation.  He had right and left heart cath in 10/2014, showing nonobstructive CAD and optimized filling pressures with CI 2.09.  He had upgrade to CRT by Dr Lovena Le.  EF up to 30-35% in 05/2015. Subsequently, the LV lead dislodged.  He was offered lead revision, but preferred watchful waiting. Echos since then have shown LVEF < 20%, most recently, echo 08/2021 showed EF 15-20%, RV mildly reduced, mild to moderate MR.    Passed out 10/26/21, device showed sustained VT with successful shock. Follow up labs OK, ECG showed QTc 583 msec. MDT device interrogation at his last visit on 11/16/2021 showed 99% RV pacing, stable thoracic impedance, 2 hr/day activity, VT/VF episode 10/25/21, short bursts of AF  He had a colonoscopy last week with significant fluid loss. He began having some sinus drainage over  the weekend, took "Robitussin" on Saturday, unsure if it was with decongestant. Yesterday had LOC x 2, then woke up and was being shocked by device. Patient felt a little dizzy prior to LOC episodes.    Potassium3.8 (10/30 0308) Magnesium  1.6* (10/30 0308) Creatinine, ser  1.46* (10/30 0308) PLT  128* (10/30 0308) HGB  12.7* (10/30 0308) WBC 5.2 (10/30 0308) Troponin I (High Sensitivity)208* (10/30 0308).    He currently denies chest pain, palpitations, dyspnea, PND, orthopnea, nausea, vomiting, dizziness, syncope, edema, weight gain, or early satiety.  Past Medical History:  Diagnosis Date   AICD (automatic cardioverter/defibrillator) present 0919/2016   BIV    Annual physical exam 06/09/2019   Atrial flutter ([place])    a. 08/2014 presented w/ aflutter->converted on amio;  b. CHA2DS2VASc = 2 -->Xarelto.   Atrial tachycardia    Chronic combined systolic and diastolic CHF (congestive heart failure) ([place])    a. 03/2013 Echo EF 50-55%;  b. 07/2013 Echo EF 20-25%, diff HK, Gr3 DD;  c. 08/2014 Echo: EF 15%, diff HK, mild AI/MR, mildly dil LA/RV, mod TR, PASP 82mHg.   DVT (deep [place]) [place]    a. 2007 RLE: S/P ankle surgery;  b. 03/2013 LLE DVT & PE-->Xarelto.   Embolism, pulmonary with infarction ([place]    a. 2007 RLE: S/P ankle surgery;  b. 03/2013 LLE DVT & PE-->Xarelto.   Hypercholesterolemia    Hypercoagulable state ([place]    Hypertension    Musculoskeletal neck pain  Nonischemic cardiomyopathy ([place])    a. 07/2013 Echo: EF 20-25%;  b. 07/2013 Myoview: Large inferior, lateral, apical scar w/ HK, no ischemia, EF 17%;  b. 08/2014 Echo: EF 15%, diff HK.   Pneumonia ~ 04/2014; 09/02/2014   Presence of permanent cardiac pacemaker    a. 03/2013 s/p MDT ADDRL1 Adapta DC PPM, ser # [id number].   Sleep apnea    does not wear mask (09/02/2014)   Third degree heart block ([place])    a. 03/2013 s/p MDT ADDRL1 Adapta DC PPM, ser # [id number].     Surgical History:  Past Surgical History:   Procedure Laterality Date   CARDIAC CATHETERIZATION N/A 10/17/2014   Procedure: Right/Left Heart Cath and Coronary Angiography;  Surgeon: Larey Dresser, MD;  Location: [place] [facility];  Service: Cardiovascular;  Laterality: N/A;   CARDIOVERSION N/A 07/26/2020   Procedure: CARDIOVERSION;  Surgeon: Buford Dresser, MD;  Location: [place];  Service: Cardiovascular;  Laterality: N/A;   COLONOSCOPY WITH PROPOFOL N/A 11/28/2021   Procedure: COLONOSCOPY WITH PROPOFOL;  Surgeon: Wilford Corner, MD;  Location: [facility];  Service: Gastroenterology;  Laterality: N/A;   ELECTROPHYSIOLOGIC STUDY N/A 09/07/2014   Procedure: A-Flutter;  Surgeon: Evans Lance, MD;  Location: [place] [facility];  Service: Cardiovascular;  Laterality: N/A;   EP IMPLANTABLE DEVICE  10/24/2014   BIV   EP IMPLANTABLE DEVICE N/A 10/24/2014   Procedure: BiV ICD Upgrade;  Surgeon: Evans Lance, MD;  Location: [place] [facility];  Service: Cardiovascular;  Laterality: N/A;   FOOT FRACTURE SURGERY Right 2007   FRACTURE SURGERY     INGUINAL HERNIA REPAIR Right 1980's   INGUINAL HERNIA REPAIR Right 09/22/2020   Procedure: LAPAROSCOPIC RIGHT INGUINAL HERNIA REPAIR WITH MESH;  Surgeon: Ralene Ok, MD;  Location: [place] [facility];  Service: General;  Laterality: Right;   INSERT / REPLACE / REMOVE PACEMAKER     MDT ADDRL1 pacemaker implanted by Dr Lovena Le for complete heart block   PERMANENT PACEMAKER INSERTION N/A 03/15/2013   Procedure: PERMANENT PACEMAKER INSERTION;  Surgeon: Evans Lance, MD;  Location: [facility];  Service: Cardiovascular;  Laterality: N/A;   POLYPECTOMY  11/28/2021   Procedure: POLYPECTOMY;  Surgeon: Wilford Corner, MD;  Location: [facility];  Service: Gastroenterology;;   Clide Deutscher  11/28/2021   Procedure: Clide Deutscher;  Surgeon: Wilford Corner, MD;  Location: [facility];  Service: Gastroenterology;;   SUBMUCOSAL TATTOO INJECTION  11/28/2021   Procedure: SUBMUCOSAL TATTOO  INJECTION;  Surgeon: Wilford Corner, MD;  Location: [facility];  Service: Gastroenterology;;     (Not in a hospital admission)   Inpatient Medications:   apixaban  5 mg Oral BID   dapagliflozin propanediol  10 mg Oral Daily   potassium chloride  10 mEq Oral BID   sacubitril-valsartan  1 tablet Oral BID   simvastatin  40 mg Oral Daily   sodium chloride flush  3 mL Intravenous Q12H   spironolactone  25 mg Oral Daily    Allergies: No Known Allergies  Social History   Socioeconomic History   Marital status: Married    Spouse name: Luellen Pucker   Number of children: 2   Years of education: Not on file   Highest education level: Not on file  Occupational History   Not on file  Tobacco Use   Smoking status: Former    Packs/day: 1.50    Years: 10.00    Total pack years: 15.00    Types: Cigarettes    Quit date: 02/04/1982  Years since quitting: 39.8    Passive exposure: Past   Smokeless tobacco: Never   Tobacco comments:    09/02/2014  "quit smoking 20-30 yr ago"  Vaping Use   Vaping Use: Never used  Substance and Sexual Activity   Alcohol use: No   Drug use: No   Sexual activity: Yes  Other Topics Concern   Not on file  Social History Narrative   Not on file   Social Determinants of Health   Financial Resource Strain: Not on file  Food Insecurity: No Food Insecurity (08/30/2021)   Hunger Vital Sign    Worried About Running Out of Food in the Last Year: Never true    Ran Out of Food in the Last Year: Never true  Transportation Needs: No Transportation Needs (08/30/2021)   PRAPARE - Hydrologist (Medical): No    Lack of Transportation (Non-Medical): No  Physical Activity: Not on file  Stress: Not on file  Social Connections: Not on file  Intimate Partner Violence: Not on file     Family History  Problem Relation Age of Onset   Diabetes type II Mother    Heart disease Father    Arrhythmia Father    Arrhythmia Sister     Hypertension Neg Hx    Heart attack Neg Hx    Stroke Neg Hx      Review of Systems: All other systems reviewed and are otherwise negative except as noted above.  Physical Exam: Vitals:   12/03/21 0630 12/03/21 0715 12/03/21 0729 12/03/21 0730  BP: 97/63 93/69  (!) 84/53  Pulse: 86 93  89  Resp: 16 (!) 21  16  Temp:   99 F (37.2 C)   TempSrc:   Oral   SpO2: 99% 100%  97%    GEN- The patient is well appearing, alert and oriented x 3 today, having difficulty sitting up HEENT: normocephalic, atraumatic; sclera clear, conjunctiva pink; hearing intact; oropharynx clear; neck supple Lungs- Clear to ausculation bilaterally, normal work of breathing.  No wheezes, rales, rhonchi Heart- irregular rate and rhythm, no murmurs, rubs or gallops GI- soft, non-tender, non-distended, bowel sounds present Extremities- no clubbing, cyanosis, or edema; DP/PT/radial pulses 2+ bilaterally MS- no significant deformity or atrophy Skin- warm and dry, no rash or lesion Psych- euthymic mood, full affect, pleasant Neuro- strength and sensation are intact  Labs:   Lab Results  Component Value Date   WBC 5.2 12/03/2021   HGB 12.7 (L) 12/03/2021   HCT 38.5 (L) 12/03/2021   MCV 94.6 12/03/2021   PLT 128 (L) 12/03/2021    Recent Labs  Lab 12/03/21 0308  NA 137  K 3.8  CL 104  CO2 21*  BUN 15  CREATININE 1.46*  CALCIUM 8.7*  PROT 6.5  BILITOT <0.1*  ALKPHOS 59  ALT 20  AST 20  GLUCOSE 138*      Radiology/Studies: DG Chest Port 1 View  Result Date: 12/03/2021 CLINICAL DATA:  ICD placement. EXAM: PORTABLE CHEST 1 VIEW COMPARISON:  04/30/2016. FINDINGS: The heart is enlarged and the mediastinal contour is stable. A multi lead pacemaker device is present over the left chest. Stable scarring or atelectasis at the right lung base. No consolidation, effusion, or pneumothorax. No acute osseous abnormality. IMPRESSION: 1. Stable atelectasis or scarring at the right lung base. 2. Cardiomegaly.  Electronically Signed   By: Brett Fairy M.D.   On: 12/03/2021 03:21   [place]  Result Date: 11/19/2021 Scheduled remote reviewed. Normal device function.  Battery estimated 39moNext remote 11/16 LA    EKG:10/30 V paced, RBBB, 87bpm  (personally reviewed)  TELEMETRY: SR with wide, paced QRS, 11 beats NSVT this AM (personally reviewed)  DEVICE HISTORY: Medtronic Battery at 159mo8 episodes treated VF with 14 shocks   Assessment/Plan: #) VT storm #) CHB s/p ICD #) AFib Appropriate device therapies No further VT RV paced >95% LV lead had increased threshold d/t dislodgement, so currently off Concern for atrial lead fracture, changed settings to VVIR Has been on tikosyn '500mg'$  BID with borderline QTc Amio bolus by EMS en route to ED Will transition off tikosyn to amPepsiCofter washout today  Consider amio load starting tomorrow AM, or sooner if having more VT CHA2DS2-VASc Score = 6 (CHF, HTN, DM, Stroke, age) AC with eliquis, appriprioately dosed at '5mg'$  BID, should continue Keep K >4, Mag > 2   #) HFrEF Appreciate HF team's assistance with mgmt  For questions or updates, please contact [email or web address] Please consult [email or web address] for contact info under Cardiology/STEMI.  Signed, SuMamie LeversNP  12/03/2021 8:25 AM
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
## 2021-12-03 NOTE — ED Provider Notes (Signed)
[name] Emergency Department Provider Note MRN:  [id number]  Arrival date & time: 12/03/21     Chief Complaint   AICD Problem   History of Present Illness   Philip Chavez is a 71 y.o. year-old male with a history of CHF, 3rd degree heart block, pacemaker presenting to the ED with chief complaint of AICD problem.  Patient felt normal today, felt that he was shocked by his defibrillator and so called EMS.  Was shocked 3 times at home and then 3 more times with EMS.  EMS gave amiodarone 150 mg.  He feels much better at this time, no symptoms.  Had a colonoscopy last week.  Review of Systems  A thorough review of systems was obtained and all systems are negative except as noted in the HPI and PMH.   Patient's Health History    Past Medical History:  Diagnosis Date   AICD (automatic cardioverter/defibrillator) present 0919/2016   BIV    Annual physical exam 06/09/2019   Atrial flutter ([place])    a. 08/2014 presented w/ aflutter->converted on amio;  b. CHA2DS2VASc = 2 -->Xarelto.   Atrial tachycardia    Chronic combined systolic and diastolic CHF (congestive heart failure) ([place])    a. 03/2013 Echo EF 50-55%;  b. 07/2013 Echo EF 20-25%, diff HK, Gr3 DD;  c. 08/2014 Echo: EF 15%, diff HK, mild AI/MR, mildly dil LA/RV, mod TR, PASP 27mHg.   DVT (deep venous thrombosis) ([place]    a. 2007 RLE: S/P ankle surgery;  b. 03/2013 LLE DVT & PE-->Xarelto.   Embolism, pulmonary with infarction ([place]    a. 2007 RLE: S/P ankle surgery;  b. 03/2013 LLE DVT & PE-->Xarelto.   Hypercholesterolemia    Hypercoagulable state ([place]    Hypertension    Musculoskeletal neck pain    Nonischemic cardiomyopathy ([place]    a. 07/2013 Echo: EF 20-25%;  b. 07/2013 Myoview: Large inferior, lateral, apical scar w/ HK, no ischemia, EF 17%;  b. 08/2014 Echo: EF 15%, diff HK.   Pneumonia ~ 04/2014; 09/02/2014   Presence of permanent cardiac pacemaker    a. 03/2013 s/p MDT ADDRL1 Adapta DC PPM, ser #  [id number].   Sleep apnea    does not wear mask (09/02/2014)   Third degree heart block ([place]    a. 03/2013 s/p MDT ADDRL1 Adapta DC PPM, ser # [id number].    Past Surgical History:  Procedure Laterality Date   CARDIAC CATHETERIZATION N/A 10/17/2014   Procedure: Right/Left Heart Cath and Coronary Angiography;  Surgeon: DLarey Dresser MD;  [facility];  Service: Cardiovascular;  Laterality: N/A;   CARDIOVERSION N/A 07/26/2020   Procedure: CARDIOVERSION;  Surgeon: CBuford Dresser MD;  [place] [facility]  Service: Cardiovascular;  Laterality: N/A;   COLONOSCOPY WITH PROPOFOL N/A 11/28/2021   Procedure: COLONOSCOPY WITH PROPOFOL;  Surgeon: SWilford Corner MD;  [facility];  Service: Gastroenterology;  Laterality: N/A;   ELECTROPHYSIOLOGIC STUDY N/A 09/07/2014   Procedure: A-Flutter;  Surgeon: GEvans Lance MD;  [place] [facility];  Service: Cardiovascular;  Laterality: N/A;   EP IMPLANTABLE DEVICE  10/24/2014   BIV   EP IMPLANTABLE DEVICE N/A 10/24/2014   Procedure: BiV ICD Upgrade;  Surgeon: GEvans Lance MD;  [facility];  Service: Cardiovascular;  Laterality: N/A;   FOOT FRACTURE SURGERY Right 2007   FRACTURE SURGERY     INGUINAL HERNIA REPAIR Right 1980's   INGUINAL HERNIA REPAIR Right 09/22/2020  Procedure: LAPAROSCOPIC RIGHT INGUINAL HERNIA REPAIR WITH MESH;  Surgeon: Ralene Ok, MD;  Location: [place];  Service: General;  Laterality: Right;   INSERT / REPLACE / REMOVE PACEMAKER     MDT ADDRL1 pacemaker implanted by Dr Lovena Le for complete heart block   PERMANENT PACEMAKER INSERTION N/A 03/15/2013   Procedure: PERMANENT PACEMAKER INSERTION;  Surgeon: Evans Lance, MD;  Location: [facility] CATH LAB;  Service: Cardiovascular;  Laterality: N/A;   POLYPECTOMY  11/28/2021   Procedure: POLYPECTOMY;  Surgeon: Wilford Corner, MD;  [facility];  Service: Gastroenterology;;   Clide Deutscher  11/28/2021   Procedure:  Clide Deutscher;  Surgeon: Wilford Corner, MD;  [facility]: [facility];  Service: Gastroenterology;;   SUBMUCOSAL TATTOO INJECTION  11/28/2021   Procedure: SUBMUCOSAL TATTOO INJECTION;  Surgeon: Wilford Corner, MD;  [facility];  Service: Gastroenterology;;    Family History  Problem Relation Age of Onset   Diabetes type II Mother    Heart disease Father    Arrhythmia Father    Arrhythmia Sister    Hypertension Neg Hx    Heart attack Neg Hx    Stroke Neg Hx     Social History   Socioeconomic History   Marital status: Married    Spouse name: Philip Chavez   Number of children: 2   Years of education: Not on file   Highest education level: Not on file  Occupational History   Not on file  Tobacco Use   Smoking status: Former    Packs/day: 1.50    Years: 10.00    Total pack years: 15.00    Types: Cigarettes    Quit date: 02/04/1982    Years since quitting: 39.8    Passive exposure: Past   Smokeless tobacco: Never   Tobacco comments:    09/02/2014  "quit smoking 20-30 yr ago"  Vaping Use   Vaping Use: Never used  Substance and Sexual Activity   Alcohol use: No   Drug use: No   Sexual activity: Yes  Other Topics Concern   Not on file  Social History Narrative   Not on file   Social Determinants of Health   Financial Resource Strain: Not on file  Food Insecurity: No Food Insecurity (08/30/2021)   Hunger Vital Sign    Worried About Running Out of Food in the Last Year: Never true    Ran Out of Food in the Last Year: Never true  Transportation Needs: No Transportation Needs (08/30/2021)   PRAPARE - Hydrologist (Medical): No    Lack of Transportation (Non-Medical): No  Physical Activity: Not on file  Stress: Not on file  Social Connections: Not on file  Intimate Partner Violence: Not on file     Physical Exam   Vitals:   12/03/21 0315 12/03/21 0330  BP: 99/62 100/71  Pulse: 88 94  Resp: (!) 33 (!) 28  Temp:    SpO2: 99% 98%     CONSTITUTIONAL: Well-appearing, NAD NEURO/PSYCH:  Alert and oriented x 3, no focal deficits EYES:  eyes equal and reactive ENT/NECK:  no LAD, no JVD CARDIO: Regular rate, well-perfused, normal S1 and S2 PULM:  CTAB no wheezing or rhonchi GI/GU:  non-distended, non-tender MSK/SPINE:  No gross deformities, no edema SKIN:  no rash, atraumatic   *Additional and/or pertinent findings included in MDM below  Diagnostic and Interventional Summary    EKG Interpretation  Date/Time:  Monday December 03 2021 03:01:36 EDT Ventricular Rate:  87 PR Interval:  204 QRS Duration: 184 QT Interval:  467 QTC Calculation: 562 R Axis:   -86 Text Interpretation: Unknown rhythm, irregular rate IVCD, consider atypical RBBB Left ventricular hypertrophy Anterior infarct, old Lateral leads are also involved Probable RV involvement, suggest recording right precordial leads Baseline wander in lead(s) V3 Partial missing lead(s): V3 Confirmed by Gerlene Fee [phone]) on 12/03/2021 3:31:45 AM       Labs Reviewed  CBC - Abnormal; Notable for the following components:      Result Value   RBC 4.07 (*)    Hemoglobin 12.7 (*)    HCT 38.5 (*)    Platelets 128 (*)    All other components within normal limits  COMPREHENSIVE METABOLIC PANEL - Abnormal; Notable for the following components:   CO2 21 (*)    Glucose, Bld 138 (*)    Creatinine, Ser 1.46 (*)    Calcium 8.7 (*)    Albumin 3.4 (*)    Total Bilirubin <0.1 (*)    GFR, Estimated 51 (*)    All other components within normal limits  PROTIME-INR - Abnormal; Notable for the following components:   Prothrombin Time 16.3 (*)    INR 1.3 (*)    All other components within normal limits  MAGNESIUM - Abnormal; Notable for the following components:   Magnesium 1.6 (*)    All other components within normal limits  TROPONIN I (HIGH SENSITIVITY) - Abnormal; Notable for the following components:   Troponin I (High Sensitivity) 208 (*)    All other components  within normal limits    DG Chest Port 1 View  Final Result      Medications  sodium chloride 0.9 % bolus 500 mL (has no administration in time range)  magnesium sulfate IVPB 2 g 50 mL (has no administration in time range)     Procedures  /  Critical Care .Critical Care  Performed by: Maudie Flakes, MD Authorized by: Maudie Flakes, MD   Critical care provider statement:    Critical care time (minutes):  40   Critical care was necessary to treat or prevent imminent or life-threatening deterioration of the following conditions: AICD shock, concern for V-fib.   Critical care was time spent personally by me on the following activities:  Development of treatment plan with patient or surrogate, discussions with consultants, evaluation of patient's response to treatment, examination of patient, ordering and review of laboratory studies, ordering and review of radiographic studies, ordering and performing treatments and interventions, pulse oximetry, re-evaluation of patient's condition and review of old charts   ED Course and Medical Decision Making  Initial Impression and Ddx AICD shock multiple times, no symptoms at this time, has received amiodarone, paced rhythm versus left bundle branch block on EKG.  Will monitor closely, awaiting labs, consulting cardiology.  Rhythm strips from EMS suspicious for VF or VT tach preceding the shocks.  Past medical/surgical history that increases complexity of ED encounter: CHF, pacemaker  Interpretation of Diagnostics I personally reviewed the EKG and my interpretation is as follows: Bundle branch block pattern  Labs reveal mild AKI, low magnesium.  Otherwise no significant blood count or electrolyte disturbance.  Patient Reassessment and Ultimate Disposition/Management Clinical Course as of 12/03/21 0432  Mon Dec 03, 2021  1696 That she passed out I was called with 9 [employer] regarding interrogation results, patient received multiple shocks  this evening, 8 of which for V-fib, patient also was demonstrating 20+ episodes of nonsustained V. tach, device also seems to  be atrial oversensing since the first shock.  These results discussed with cardiology, anticipating admission. [MB]    Clinical Course User Index [MB] Sedonia Small Barth Kirks, MD       Patient management required discussion with the following services or consulting groups:  Cardiology  Complexity of Problems Addressed Acute illness or injury that poses threat of life of bodily function  Additional Data Reviewed and Analyzed Further history obtained from: EMS on arrival  Additional Factors Impacting ED Encounter Risk Consideration of hospitalization  Barth Kirks. Sedonia Small, MD Dustin [email or web address]  Final Clinical Impressions(s) / ED Diagnoses     ICD-10-CM   1. AICD discharge  Z45.02       ED Discharge Orders     None        Discharge Instructions Discussed with and Provided to Patient:   Discharge Instructions   None      Maudie Flakes, MD 12/03/21 [phone]

## 2021-12-03 NOTE — ED Notes (Signed)
Troponin 685 MD aware. Provider put in repeat EKG.

## 2021-12-04 ENCOUNTER — Telehealth: Payer: Self-pay | Admitting: Internal Medicine

## 2021-12-04 DIAGNOSIS — I4901 Ventricular fibrillation: Secondary | ICD-10-CM | POA: Diagnosis not present

## 2021-12-04 LAB — BASIC METABOLIC PANEL
Anion gap: 11 (ref 5–15)
BUN: 16 mg/dL (ref 8–23)
CO2: 19 mmol/L — ABNORMAL LOW (ref 22–32)
Calcium: 8.9 mg/dL (ref 8.9–10.3)
Chloride: 107 mmol/L (ref 98–111)
Creatinine, Ser: 1.42 mg/dL — ABNORMAL HIGH (ref 0.61–1.24)
GFR, Estimated: 53 mL/min — ABNORMAL LOW (ref >60)
Glucose, Bld: 145 mg/dL — ABNORMAL HIGH (ref 70–99)
Potassium: 4.4 mmol/L (ref 3.5–5.1)
Sodium: 137 mmol/L (ref 135–145)

## 2021-12-04 LAB — CBC
HCT: 45.1 % (ref 39.0–52.0)
Hemoglobin: 14.7 g/dL (ref 13.0–17.0)
MCH: 30.6 pg (ref 26.0–34.0)
MCHC: 32.6 g/dL (ref 30.0–36.0)
MCV: 93.8 fL (ref 80.0–100.0)
Platelets: 161 10*3/uL (ref 150–400)
RBC: 4.81 MIL/uL (ref 4.22–5.81)
RDW: 14.7 % (ref 11.5–15.5)
WBC: 10.3 10*3/uL (ref 4.0–10.5)
nRBC: 0 % (ref 0.0–0.2)

## 2021-12-04 LAB — MAGNESIUM: Magnesium: 2 mg/dL (ref 1.7–2.4)

## 2021-12-04 MED ORDER — MELATONIN 5 MG PO TABS
5.0000 mg | ORAL_TABLET | Freq: Every evening | ORAL | Status: DC | PRN
  Administered 2021-12-04: 5 mg via ORAL
  Filled 2021-12-04: qty 1

## 2021-12-04 MED ORDER — DIGOXIN 125 MCG PO TABS
0.1250 mg | ORAL_TABLET | Freq: Every day | ORAL | Status: DC
  Administered 2021-12-04: 0.125 mg via ORAL
  Filled 2021-12-04: qty 1

## 2021-12-04 MED ORDER — SODIUM CHLORIDE 0.9% FLUSH
3.0000 mL | Freq: Two times a day (BID) | INTRAVENOUS | Status: DC
  Administered 2021-12-04 – 2021-12-05 (×2): 3 mL via INTRAVENOUS

## 2021-12-04 MED ORDER — SODIUM CHLORIDE 0.9% FLUSH: 3.0000 mL | INTRAVENOUS | Status: DC | PRN

## 2021-12-04 MED ORDER — ALPRAZOLAM 0.25 MG PO TABS
0.2500 mg | ORAL_TABLET | Freq: Two times a day (BID) | ORAL | Status: DC | PRN
  Administered 2021-12-04: 0.25 mg via ORAL
  Filled 2021-12-04 (×2): qty 1

## 2021-12-04 MED ORDER — AMIODARONE HCL IN DEXTROSE 360-4.14 MG/200ML-% IV SOLN
30.0000 mg/h | INTRAVENOUS | Status: DC
  Administered 2021-12-04 – 2021-12-05 (×2): 30 mg/h via INTRAVENOUS
  Filled 2021-12-04: qty 200

## 2021-12-04 MED ORDER — HYDRALAZINE HCL 25 MG PO TABS: 12.5000 mg | ORAL_TABLET | Freq: Three times a day (TID) | ORAL | Status: DC

## 2021-12-04 MED ORDER — SODIUM CHLORIDE 0.9 % IV SOLN: INTRAVENOUS | Status: DC

## 2021-12-04 MED ORDER — AMIODARONE LOAD VIA INFUSION
150.0000 mg | Freq: Once | INTRAVENOUS | Status: AC
  Administered 2021-12-04: 150 mg via INTRAVENOUS
  Filled 2021-12-04: qty 83.34

## 2021-12-04 MED ORDER — AMIODARONE HCL IN DEXTROSE 360-4.14 MG/200ML-% IV SOLN
30.0000 mg/h | INTRAVENOUS | Status: DC
  Filled 2021-12-04: qty 200

## 2021-12-04 MED ORDER — AMIODARONE HCL IN DEXTROSE 360-4.14 MG/200ML-% IV SOLN
60.0000 mg/h | INTRAVENOUS | Status: AC
  Administered 2021-12-04 (×2): 60 mg/h via INTRAVENOUS
  Filled 2021-12-04 (×2): qty 200

## 2021-12-04 MED ORDER — SODIUM CHLORIDE 0.9 % IV SOLN: 250.0000 mL | INTRAVENOUS | Status: DC | PRN

## 2021-12-04 MED ORDER — ZOLPIDEM TARTRATE 5 MG PO TABS
5.0000 mg | ORAL_TABLET | Freq: Every evening | ORAL | Status: DC | PRN
  Administered 2021-12-04: 5 mg via ORAL
  Filled 2021-12-04: qty 1

## 2021-12-04 MED ORDER — ISOSORBIDE MONONITRATE ER 30 MG PO TB24: 30.0000 mg | ORAL_TABLET | Freq: Every day | ORAL | Status: DC

## 2021-12-04 MED ORDER — DIGOXIN 125 MCG PO TABS
0.0625 mg | ORAL_TABLET | Freq: Every day | ORAL | Status: DC
  Administered 2021-12-05: 0.0625 mg via ORAL
  Filled 2021-12-04: qty 1

## 2021-12-04 NOTE — ED Notes (Signed)
This RN paged Dr. Aundra Dubin about pt's BP dropping down into the 80's. Per Dr. Aundra Dubin pt runs in the high 80's-100's and stated for [place] not to be alarmed.

## 2021-12-04 NOTE — Telephone Encounter (Signed)
New  Message:    Patient says he is in [facility] ER. He said Dr Lovena Le and one of his assistant came to see him earlier this morning. He said to please tell Dr Lovena Le or his assistant to please come back to see him or give him  call please.

## 2021-12-04 NOTE — Progress Notes (Addendum)
Advanced Heart Failure Rounding Note  PCP-Cardiologist: None   Subjective:    BP and Scr stable with GDMT.   Off Tikosyn. Loading with IV amiodarone.   Produced a little bit of blood tinged sputum this am, reports productive cough X 2 days PTA. Cough now improving.  No dyspnea, orthopnea, or PND.   Very anxious after recent ICD shocks   Echo 10/30: EF 15%, severely dilated LV, RV mildly reduced, mild MR, mild AI  Objective:   Weight Range: 88.5 kg Body mass index is 25.74 kg/m.   Vital Signs:   Temp:  [97.9 F (36.6 C)-98.8 F (37.1 C)] 98.4 F (36.9 C) (10/31 0756) Pulse Rate:  [52-101] 101 (10/31 1000) Resp:  [16-29] 25 (10/31 1000) BP: (88-109)/(64-80) 109/67 (10/31 1000) SpO2:  [90 %-98 %] 91 % (10/31 1000) Weight:  [88.5 kg] 88.5 kg (10/30 1113) Last BM Date : 12/02/21  Weight change: Filed Weights   12/03/21 1113  Weight: 88.5 kg    Intake/Output:   Intake/Output Summary (Last 24 hours) at 12/04/2021 1103 Last data filed at 12/04/2021 0757 Gross per 24 hour  Intake --  Output 1150 ml  Net -1150 ml      Physical Exam    General:  Well appearing. No resp difficulty HEENT: normal Neck: supple. JVP 6-7 cm. Carotids 2+ bilat; no bruits.  Cor: PMI nondisplaced. Regular rate & rhythm. No rubs, gallops or murmurs. Lungs: clear Abdomen: soft, nontender, nondistended.  Extremities: no cyanosis, clubbing, rash, edema Neuro: alert & orientedx3, cranial nerves grossly intact. moves all 4 extremities w/o difficulty. Affect pleasant    Telemetry   Underlying rhythm sinus with PACs 90s-100s, V paced   Labs    CBC Recent Labs    12/03/21 0308  WBC 5.2  HGB 12.7*  HCT 38.5*  MCV 94.6  PLT 932*   Basic Metabolic Panel Recent Labs    12/03/21 0308 12/04/21 0357  NA 137 137  K 3.8 4.4  CL 104 107  CO2 21* 19*  GLUCOSE 138* 145*  BUN 15 16  CREATININE 1.46* 1.42*  CALCIUM 8.7* 8.9  MG 1.6* 2.0   Liver Function Tests Recent Labs     12/03/21 0308  AST 20  ALT 20  ALKPHOS 59  BILITOT <0.1*  PROT 6.5  ALBUMIN 3.4*   No results for input(s): "LIPASE", "AMYLASE" in the last 72 hours. Cardiac Enzymes No results for input(s): "CKTOTAL", "CKMB", "CKMBINDEX", "TROPONINI" in the last 72 hours.  BNP: BNP (last 3 results) No results for input(s): "BNP" in the last 8760 hours.  ProBNP (last 3 results) No results for input(s): "PROBNP" in the last 8760 hours.   D-Dimer No results for input(s): "DDIMER" in the last 72 hours. Hemoglobin A1C No results for input(s): "HGBA1C" in the last 72 hours. Fasting Lipid Panel No results for input(s): "CHOL", "HDL", "LDLCALC", "TRIG", "CHOLHDL", "LDLDIRECT" in the last 72 hours. Thyroid Function Tests No results for input(s): "TSH", "T4TOTAL", "T3FREE", "THYROIDAB" in the last 72 hours.  Invalid input(s): "FREET3"  Other results:   Imaging    ECHOCARDIOGRAM COMPLETE  Result Date: 12/03/2021    ECHOCARDIOGRAM REPORT   Patient Name:   Philip Chavez Date of Exam: 12/03/2021 Medical Rec #:  [id number]     Height:       73.0 in Accession #:    [id number]    Weight:       195.1 lb Date of Birth:  Dec 22, 1950    BSA:  2.129 m Patient Age:    71 years      BP:           96/84 mmHg Patient Gender: M             HR:           75 bpm. Exam Location:  Inpatient Procedure: 2D Echo, Cardiac Doppler, Color Doppler and Intracardiac            Opacification Agent Indications:    ventricular fibrillation  History:        Patient has prior history of Echocardiogram examinations, most                 recent 09/03/2021. Cardiomegaly and CHF, Abnormal ECG and                 Defibrillator; Arrythmias:Ventricular Fibrillation and 3rd                 degree heart block.  Sonographer:    Melissa Morford RDCS (AE, PE) Referring Phys: Elby Showers Kentley Blyden IMPRESSIONS  1. Left ventricular ejection fraction, by estimation, is 15%. The left ventricle has severely decreased function. The left ventricle  demonstrates global hypokinesis. The left ventricular internal cavity size was severely dilated. Left ventricular diastolic parameters are indeterminate.  2. Right ventricular systolic function is mildly reduced. The right ventricular size is mildly enlarged.  3. Left atrial size was mildly dilated.  4. The mitral valve is grossly normal. Mild mitral valve regurgitation. No evidence of mitral stenosis.  5. The aortic valve is tricuspid. Aortic valve regurgitation is mild. No aortic stenosis is present.  6. Aortic dilatation noted. There is mild dilatation of the aortic root, measuring 41 mm. Conclusion(s)/Recommendation(s): No left ventricular mural or apical thrombus/thrombi, swirling contrast is seen. FINDINGS  Left Ventricle: Left ventricular ejection fraction, by estimation, is 15%. The left ventricle has severely decreased function. The left ventricle demonstrates global hypokinesis. The left ventricular internal cavity size was severely dilated. There is no left ventricular hypertrophy. Left ventricular diastolic parameters are indeterminate. Right Ventricle: The right ventricular size is mildly enlarged. No increase in right ventricular wall thickness. Right ventricular systolic function is mildly reduced. Left Atrium: Left atrial size was mildly dilated. Right Atrium: Right atrial size was normal in size. Pericardium: There is no evidence of pericardial effusion. Mitral Valve: The mitral valve is grossly normal. Mild mitral valve regurgitation. No evidence of mitral valve stenosis. Tricuspid Valve: The tricuspid valve is normal in structure. Tricuspid valve regurgitation is mild . No evidence of tricuspid stenosis. Aortic Valve: The aortic valve is tricuspid. Aortic valve regurgitation is mild. Aortic regurgitation PHT measures 797 msec. No aortic stenosis is present. Pulmonic Valve: The pulmonic valve was thickened with good excursion. Pulmonic valve regurgitation is not visualized. No evidence of pulmonic  stenosis. Aorta: Aortic dilatation noted. There is mild dilatation of the aortic root, measuring 41 mm. Venous: The inferior vena cava was not well visualized. IAS/Shunts: No atrial level shunt detected by color flow Doppler. Additional Comments: A device lead is visualized in the right atrium and right ventricle.  LEFT VENTRICLE PLAX 2D LVIDd:         7.30 cm   Diastology LVIDs:         6.90 cm   LV e' medial:    5.55 cm/s LV PW:         0.80 cm   LV E/e' medial:  25.4 LV IVS:  0.80 cm   LV e' lateral:   8.03 cm/s LVOT diam:     2.60 cm   LV E/e' lateral: 17.6 LV SV:         87 LV SV Index:   41 LVOT Area:     5.31 cm  RIGHT VENTRICLE RV S prime:     13.40 cm/s LEFT ATRIUM           Index        RIGHT ATRIUM           Index LA diam:      4.40 cm 2.07 cm/m   RA Area:     19.20 cm LA Vol (A4C): 79.1 ml 37.15 ml/m  RA Volume:   56.30 ml  26.44 ml/m  AORTIC VALVE LVOT Vmax:   78.20 cm/s LVOT Vmean:  53.800 cm/s LVOT VTI:    0.163 m AI PHT:      797 msec  AORTA Ao Root diam: 4.10 cm Ao Asc diam:  3.30 cm MITRAL VALVE                TRICUSPID VALVE MV Area (PHT): 7.16 cm     TR Peak grad:   18.3 mmHg MV Decel Time: 106 msec     TR Vmax:        214.00 cm/s MV E velocity: 141.00 cm/s                             SHUNTS                             Systemic VTI:  0.16 m                             Systemic Diam: 2.60 cm Cherlynn Kaiser MD Electronically signed by Cherlynn Kaiser MD Signature Date/Time: 12/03/2021/4:05:37 PM    Final      Medications:     Scheduled Medications:  apixaban  5 mg Oral BID   bisoprolol  2.5 mg Oral Daily   dapagliflozin propanediol  10 mg Oral Daily   potassium chloride  10 mEq Oral BID   sacubitril-valsartan  1 tablet Oral BID   simvastatin  40 mg Oral Daily   sodium chloride flush  3 mL Intravenous Q12H   spironolactone  25 mg Oral Daily    Infusions:  sodium chloride     amiodarone 60 mg/hr (12/04/21 0942)    PRN Medications: sodium chloride, acetaminophen,  melatonin, ondansetron (ZOFRAN) IV, sodium chloride flush    Patient Profile   71 y/o male w/ 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   Of note, he had a colonoscopy last week and had significant fluid loss with the prep. Mg 1.6 on admission, K 3.8. QT prolonged.   Assessment/Plan   1. VT Storm/ ICD Shock  - in setting of chronic systolic HF + Tikosyn use for AFib + hypomagnesemia  - not all strips are included with the report but it looks like he received 14 total ICD shocks in VF zone  - QT prolonged, 562 ms, Mg low 1.6, suspect related to recent bowel prep for colonoscopy  - Keep Mg > 2.0 and K > 4.0  - Tikosyn stopped. Loading with IV  amiodarone today. - Recheck ECG - EP following  2. Chronic Systolic Heart Failure - NICM, EF 15% on echo in 8/16.  Patient had borderline reduced EF when PPM was placed in 2/15.  After that, EF fell significantly.  He has a history of complete heart block and father had CHF and CHB with pacemaker, and sounds like sister also had CHB with pacemaker. 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  He had CRT-D upgrade.  No significant CAD on last cath.  Echo in 4/17 showed improvement in EF to 30-35%.  Echo in 5/18 showed EF down to 20%.  Echo in 12/21 shows EF < 20% and moderately dilated. Echo this admit with EF 15%. His LV lead became dislodged and he is no longer BiV pacing, now RV pacing > 99% of the time.  Given minimal symptoms, decision was made not to revise his LV lead. Would like EP to consider re-attempting some form of CRT (CS lead versus left bundle lead).   -NYHA Class II. Euvolemic on exam  - continue Farxiga 10 mg daily  - continue Entresto 24-26 mg bid - continue spironolactone 25 mg daily  - continue digoxin 0.125 mg daily, dig level okay on 10/30 - continue bisoprolol 2.5 mg daily - will not restart  imdur/hydralazine with soft BP  3. PAF - on Tikosyn, PTA. Holding insetting of VT - on Eliquis - Now on amio  4. Elevated Trop - Hs trop 208, suspect demand ischemia in setting of VT  - no ischemic CP   5. Stage IIIa CKD - SCr 1.42, baseline 1.3-1.5 - follow BMP   6. Blood tinged sputum - ? Small amount, d/t recent coughing episodes - No acute findings on CXR yesterday - Check CBC  Load with IV amio today. If no recurrent VT overnight will be ready for discharge tomorrow.   Length of Stay: 1  FINCH, LINDSAY N, PA-C  12/04/2021, 11:03 AM  Advanced Heart Failure Team Pager [phone] (M-F; 7a - 5p)  Please contact [facility] for night-coverage after hours (5p -7a ) and weekends on [email or web address]  Patient seen with PA, agree with the above note.   Patient had colonoscopy last week with fluid loss for that, continued all cardiac meds.  The day of admission, had 14 shocks for VF.  Mg was low at 1.6.  He has been on Tikosyn with baseline borderline QTc. QTc on admission ECG was 562 msec.   Patient denies significant exertional dyspnea.  No chest pain.  Mild HS-TnI elevation to 685 in setting of multiple shocks.    General: NAD Neck: No JVD, no thyromegaly or thyroid nodule.  Lungs: Clear to auscultation bilaterally with normal respiratory effort. CV: Nondisplaced PMI.  Heart regular S1/S2, no S3/S4, no murmur.  No peripheral edema.   Abdomen: Soft, nontender, no hepatosplenomegaly, no distention.  Skin: Intact without lesions or rashes.  Neurologic: Alert and oriented x 3.  Psych: Normal affect. Extremities: No clubbing or cyanosis.  HEENT: Normal.   Suspect VT/VF in setting of Tikosyn use with electrolyte abnormalities post-colonoscopy, baseline QTc is borderline for Tikosyn use. Doubt ACS, elevated troponin is likely due to multiple shocks.  - Agree with stopping Tikosyn.  - Starting IV amiodarone load today.    Echo shows that EF remains markedly low at 15% with mild  RV dysfunction.  Though VT is likely most related to Tikosyn and electrolyte abnormalities, I would like to invasively assess his filling  pressures and cardiac output.  - I will arrange for RHC tomorrow morning.  Discussed risks/benefits with patient and he agrees to procedure.  - Add back digoxin. - Add back home hydralazine/Imdur.  - RV paces > 95% of the time, LV lead has been dislodged.  With minimal symptoms in the past, have not re-attempted CRT.  Would like EP to consider re-attempting some form of CRT (CS lead versus left bundle lead), especially if cardiac output is low on RHC tomorrow.     Loralie Champagne 12/04/2021 12:07 PM  SBP down to upper 80s.  Would continue to hold hydralazine/Imdur.    However, his baseline SBP is in 90s much of the time so nursing staff should not be overly alarmed in this range.   Loralie Champagne 12/04/2021

## 2021-12-04 NOTE — Progress Notes (Addendum)
Electrophysiology Rounding Note  Patient Name: Philip Chavez Date of Encounter: 12/04/2021  Primary Cardiologist: None Electrophysiologist: [name], MD   Subjective   The patient is doing well today, still in ER Requesting to go home as soon as possible to care for spouse No acute complaints or concerns  Inpatient Medications    Scheduled Meds:  amiodarone  150 mg Intravenous Once   apixaban  5 mg Oral BID   bisoprolol  2.5 mg Oral Daily   dapagliflozin propanediol  10 mg Oral Daily   potassium chloride  10 mEq Oral BID   sacubitril-valsartan  1 tablet Oral BID   simvastatin  40 mg Oral Daily   sodium chloride flush  3 mL Intravenous Q12H   spironolactone  25 mg Oral Daily   Continuous Infusions:  sodium chloride     amiodarone     PRN Meds: sodium chloride, acetaminophen, melatonin, ondansetron (ZOFRAN) IV, sodium chloride flush   Vital Signs    Vitals:   12/04/21 0100 12/04/21 0345 12/04/21 0400 12/04/21 0756  BP: 105/64  99/70 100/71  Pulse: (!) 52 (!) 55 (!) 52 82  Resp: (!) 22 20 (!) 21 19  Temp:  98.8 F (37.1 C)  98.4 F (36.9 C)  TempSrc:  Oral  Oral  SpO2: 92% 92% 93% 97%  Weight:      Height:        Intake/Output Summary (Last 24 hours) at 12/04/2021 0910 Last data filed at 12/04/2021 0757 Gross per 24 hour  Intake --  Output 1150 ml  Net -1150 ml   Filed Weights   12/03/21 1113  Weight: 88.5 kg    Physical Exam    Grossly unchanged from prior GEN- The patient is well appearing, alert and oriented x 3 today.   Head- normocephalic, atraumatic Eyes-  Sclera clear, conjunctiva pink Ears- hearing intact Oropharynx- clear Neck- supple Lungs- Clear to ausculation bilaterally, normal work of breathing Heart- Regular rate and rhythm, no murmurs, rubs or gallops GI- soft, NT, ND, + BS Extremities- no clubbing or cyanosis. No edema Skin- no rash or lesion Psych- euthymic mood, full affect Neuro- strength and sensation are  intact  Labs    CBC Recent Labs    12/03/21 0308  WBC 5.2  HGB 12.7*  HCT 38.5*  MCV 94.6  PLT 109*   Basic Metabolic Panel Recent Labs    12/03/21 0308 12/04/21 0357  NA 137 137  K 3.8 4.4  CL 104 107  CO2 21* 19*  GLUCOSE 138* 145*  BUN 15 16  CREATININE 1.46* 1.42*  CALCIUM 8.7* 8.9  MG 1.6* 2.0   Liver Function Tests Recent Labs    12/03/21 0308  AST 20  ALT 20  ALKPHOS 59  BILITOT <0.1*  PROT 6.5  ALBUMIN 3.4*   No results for input(s): "LIPASE", "AMYLASE" in the last 72 hours. Cardiac Enzymes No results for input(s): "CKTOTAL", "CKMB", "CKMBINDEX", "TROPONINI" in the last 72 hours.   Telemetry    V-paced, irregular rhythm (personally reviewed)  Radiology    ECHOCARDIOGRAM COMPLETE  Result Date: 12/03/2021    ECHOCARDIOGRAM REPORT   Patient Name:   Philip Chavez Date of Exam: 12/03/2021 Medical Rec #:  [id number]     Height:       73.0 in Accession #:    [id number]    Weight:       195.1 lb Date of Birth:  January 25, 1951    BSA:  2.129 m Patient Age:    17 years      BP:           96/84 mmHg Patient Gender: M             HR:           75 bpm. Exam Location:  Inpatient Procedure: 2D Echo, Cardiac Doppler, Color Doppler and Intracardiac            Opacification Agent Indications:    ventricular fibrillation  History:        Patient has prior history of Echocardiogram examinations, most                 recent 09/03/2021. Cardiomegaly and CHF, Abnormal ECG and                 Defibrillator; Arrythmias:Ventricular Fibrillation and 3rd                 degree heart block.  Sonographer:    Melissa Morford RDCS (AE, PE) Referring Phys: Elby Showers MCLEAN IMPRESSIONS  1. Left ventricular ejection fraction, by estimation, is 15%. The left ventricle has severely decreased function. The left ventricle demonstrates global hypokinesis. The left ventricular internal cavity size was severely dilated. Left ventricular diastolic parameters are indeterminate.  2. Right  ventricular systolic function is mildly reduced. The right ventricular size is mildly enlarged.  3. Left atrial size was mildly dilated.  4. The mitral valve is grossly normal. Mild mitral valve regurgitation. No evidence of mitral stenosis.  5. The aortic valve is tricuspid. Aortic valve regurgitation is mild. No aortic stenosis is present.  6. Aortic dilatation noted. There is mild dilatation of the aortic root, measuring 41 mm. Conclusion(s)/Recommendation(s): No left ventricular mural or apical thrombus/thrombi, swirling contrast is seen. FINDINGS  Left Ventricle: Left ventricular ejection fraction, by estimation, is 15%. The left ventricle has severely decreased function. The left ventricle demonstrates global hypokinesis. The left ventricular internal cavity size was severely dilated. There is no left ventricular hypertrophy. Left ventricular diastolic parameters are indeterminate. Right Ventricle: The right ventricular size is mildly enlarged. No increase in right ventricular wall thickness. Right ventricular systolic function is mildly reduced. Left Atrium: Left atrial size was mildly dilated. Right Atrium: Right atrial size was normal in size. Pericardium: There is no evidence of pericardial effusion. Mitral Valve: The mitral valve is grossly normal. Mild mitral valve regurgitation. No evidence of mitral valve stenosis. Tricuspid Valve: The tricuspid valve is normal in structure. Tricuspid valve regurgitation is mild . No evidence of tricuspid stenosis. Aortic Valve: The aortic valve is tricuspid. Aortic valve regurgitation is mild. Aortic regurgitation PHT measures 797 msec. No aortic stenosis is present. Pulmonic Valve: The pulmonic valve was thickened with good excursion. Pulmonic valve regurgitation is not visualized. No evidence of pulmonic stenosis. Aorta: Aortic dilatation noted. There is mild dilatation of the aortic root, measuring 41 mm. Venous: The inferior vena cava was not well visualized.  IAS/Shunts: No atrial level shunt detected by color flow Doppler. Additional Comments: A device lead is visualized in the right atrium and right ventricle.  LEFT VENTRICLE PLAX 2D LVIDd:         7.30 cm   Diastology LVIDs:         6.90 cm   LV e' medial:    5.55 cm/s LV PW:         0.80 cm   LV E/e' medial:  25.4 LV IVS:  0.80 cm   LV e' lateral:   8.03 cm/s LVOT diam:     2.60 cm   LV E/e' lateral: 17.6 LV SV:         87 LV SV Index:   41 LVOT Area:     5.31 cm  RIGHT VENTRICLE RV S prime:     13.40 cm/s LEFT ATRIUM           Index        RIGHT ATRIUM           Index LA diam:      4.40 cm 2.07 cm/m   RA Area:     19.20 cm LA Vol (A4C): 79.1 ml 37.15 ml/m  RA Volume:   56.30 ml  26.44 ml/m  AORTIC VALVE LVOT Vmax:   78.20 cm/s LVOT Vmean:  53.800 cm/s LVOT VTI:    0.163 m AI PHT:      797 msec  AORTA Ao Root diam: 4.10 cm Ao Asc diam:  3.30 cm MITRAL VALVE                TRICUSPID VALVE MV Area (PHT): 7.16 cm     TR Peak grad:   18.3 mmHg MV Decel Time: 106 msec     TR Vmax:        214.00 cm/s MV E velocity: 141.00 cm/s                             SHUNTS                             Systemic VTI:  0.16 m                             Systemic Diam: 2.60 cm Cherlynn Kaiser MD Electronically signed by Cherlynn Kaiser MD Signature Date/Time: 12/03/2021/4:05:37 PM    Final    DG Chest Port 1 View  Result Date: 12/03/2021 CLINICAL DATA:  ICD placement. EXAM: PORTABLE CHEST 1 VIEW COMPARISON:  04/30/2016. FINDINGS: The heart is enlarged and the mediastinal contour is stable. A multi lead pacemaker device is present over the left chest. Stable scarring or atelectasis at the right lung base. No consolidation, effusion, or pneumothorax. No acute osseous abnormality. IMPRESSION: 1. Stable atelectasis or scarring at the right lung base. 2. Cardiomegaly. Electronically Signed   By: Brett Fairy M.D.   On: 12/03/2021 03:21    Patient Profile     Philip Chavez is a 70 y.o. 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  he was admitted for multiple VT therapies.   Assessment & Plan    #) VT storm #) CHB s/p ICD #) AFib Appropriate device therapies No further VT RV paced >95% LV lead had increased threshold d/t dislodgement, so currently off Tikosyn stopped Will IV amio load today Consider PO amio tomorrow if VT remains quiescent  CHA2DS2-VASc Score = 6 (CHF, HTN, DM, Stroke, age) AC with eliquis, appriprioately dosed at '5mg'$  BID, should continue Keep K >4, Mag > 2  Will need outpatient f/u to discuss gen change +/- ICD system revision    #) HFrEF Appreciate HF team's assistance with mgmt   For questions or updates, please  contact [place] [facility] Please consult [email or web address] for contact info under Cardiology/STEMI.  Signed, Mamie Levers, NP  12/04/2021, 9:10 AM   EP Attending  Patient seen and examined. Agree with the findings as noted above. The patient is doing well. Anxious about going home as his wife has severe dementia. We will start IV amio today and plan for DC home tomorrow if no more VT. After he has gone home we will discuss Biv as an outpatient. It would require extraction of his current LV lead and likely a left bundle area lead.  Carleene Overlie Damario Gillie,MD

## 2021-12-05 ENCOUNTER — Encounter (HOSPITAL_COMMUNITY)
Admission: EM | Disposition: A | Discharge status: Home / Self Care | Payer: Self-pay | Source: Home / Self Care | Attending: Cardiology

## 2021-12-05 ENCOUNTER — Encounter (HOSPITAL_COMMUNITY): Payer: Self-pay | Admitting: Cardiology

## 2021-12-05 DIAGNOSIS — I4901 Ventricular fibrillation: Secondary | ICD-10-CM | POA: Diagnosis not present

## 2021-12-05 DIAGNOSIS — I509 Heart failure, unspecified: Secondary | ICD-10-CM

## 2021-12-05 HISTORY — PX: RIGHT HEART CATH: CATH118263

## 2021-12-05 LAB — MAGNESIUM: Magnesium: 2.2 mg/dL (ref 1.7–2.4)

## 2021-12-05 LAB — BASIC METABOLIC PANEL
Anion gap: 14 (ref 5–15)
BUN: 25 mg/dL — ABNORMAL HIGH (ref 8–23)
CO2: 22 mmol/L (ref 22–32)
Calcium: 9.2 mg/dL (ref 8.9–10.3)
Chloride: 100 mmol/L (ref 98–111)
Creatinine, Ser: 1.73 mg/dL — ABNORMAL HIGH (ref 0.61–1.24)
GFR, Estimated: 42 mL/min — ABNORMAL LOW (ref >60)
Glucose, Bld: 129 mg/dL — ABNORMAL HIGH (ref 70–99)
Potassium: 4.4 mmol/L (ref 3.5–5.1)
Sodium: 136 mmol/L (ref 135–145)

## 2021-12-05 LAB — GLUCOSE, CAPILLARY: Glucose-Capillary: 119 mg/dL — ABNORMAL HIGH (ref 70–99)

## 2021-12-05 SURGERY — RIGHT HEART CATH
Anesthesia: LOCAL

## 2021-12-05 MED ORDER — LIDOCAINE HCL (PF) 1 % IJ SOLN
INTRAMUSCULAR | Status: AC
  Filled 2021-12-05: qty 30

## 2021-12-05 MED ORDER — FUROSEMIDE 10 MG/ML IJ SOLN
40.0000 mg | Freq: Two times a day (BID) | INTRAMUSCULAR | Status: AC
  Administered 2021-12-05 (×2): 40 mg via INTRAVENOUS
  Filled 2021-12-05 (×2): qty 4

## 2021-12-05 MED ORDER — ONDANSETRON HCL 4 MG/2ML IJ SOLN: 4.0000 mg | Freq: Four times a day (QID) | INTRAMUSCULAR | Status: DC | PRN

## 2021-12-05 MED ORDER — ATORVASTATIN CALCIUM 10 MG PO TABS: 20.0000 mg | ORAL_TABLET | Freq: Every day | ORAL | Status: DC

## 2021-12-05 MED ORDER — FUROSEMIDE 10 MG/ML IJ SOLN: 40.0000 mg | Freq: Two times a day (BID) | INTRAMUSCULAR | Status: DC

## 2021-12-05 MED ORDER — LABETALOL HCL 5 MG/ML IV SOLN: 10.0000 mg | INTRAVENOUS | Status: AC | PRN

## 2021-12-05 MED ORDER — AMIODARONE HCL 200 MG PO TABS
400.0000 mg | ORAL_TABLET | Freq: Two times a day (BID) | ORAL | Status: DC
  Administered 2021-12-05 (×2): 400 mg via ORAL
  Filled 2021-12-05 (×2): qty 2

## 2021-12-05 MED ORDER — SODIUM CHLORIDE 0.9 % IV SOLN: 250.0000 mL | INTRAVENOUS | Status: DC | PRN

## 2021-12-05 MED ORDER — SODIUM CHLORIDE 0.9% FLUSH
3.0000 mL | Freq: Two times a day (BID) | INTRAVENOUS | Status: DC
  Administered 2021-12-05 (×2): 3 mL via INTRAVENOUS

## 2021-12-05 MED ORDER — LIDOCAINE HCL (PF) 1 % IJ SOLN
INTRAMUSCULAR | Status: DC | PRN
  Administered 2021-12-05: 2 mL

## 2021-12-05 MED ORDER — SODIUM CHLORIDE 0.9% FLUSH: 3.0000 mL | INTRAVENOUS | Status: DC | PRN

## 2021-12-05 MED ORDER — HEPARIN (PORCINE) IN NACL 1000-0.9 UT/500ML-% IV SOLN
INTRAVENOUS | Status: AC
  Filled 2021-12-05: qty 500

## 2021-12-05 MED ORDER — HEPARIN (PORCINE) IN NACL 1000-0.9 UT/500ML-% IV SOLN
INTRAVENOUS | Status: DC | PRN
  Administered 2021-12-05: 500 mL

## 2021-12-05 MED ORDER — HYDRALAZINE HCL 20 MG/ML IJ SOLN: 10.0000 mg | INTRAMUSCULAR | Status: AC | PRN

## 2021-12-05 MED ORDER — ACETAMINOPHEN 325 MG PO TABS: 650.0000 mg | ORAL_TABLET | ORAL | Status: DC | PRN

## 2021-12-05 SURGICAL SUPPLY — 8 items
CATH SWAN GANZ 7F STRAIGHT (CATHETERS) IMPLANT
GLIDESHEATH SLEND SS 6F .021 (SHEATH) IMPLANT
KIT HEART LEFT (KITS) ×2 IMPLANT
PACK CARDIAC CATHETERIZATION (CUSTOM PROCEDURE TRAY) ×2 IMPLANT
SHEATH PINNACLE 7F 10CM (SHEATH) IMPLANT
SHEATH PROBE COVER 6X72 (BAG) IMPLANT
TRANSDUCER W/STOPCOCK (MISCELLANEOUS) ×2 IMPLANT
WIRE EMERALD 3MM-J .035X150CM (WIRE) IMPLANT

## 2021-12-05 NOTE — H&P (View-Only) (Signed)
Advanced Heart Failure Rounding Note  PCP-Cardiologist: None   Subjective:   Admitted with VT Storm.   Off Tikosyn. Loading with IV amiodarone.   Echo 10/30: EF 15%, severely dilated LV, RV mildly reduced, mild MR, mild AI   Feels ok.   Objective:   Weight Range: 86.4 kg Body mass index is 25.13 kg/m.   Vital Signs:   Temp:  [97.9 F (36.6 C)-98.6 F (37 C)] 97.9 F (36.6 C) (11/01 0341) Pulse Rate:  [58-101] 89 (11/01 0341) Resp:  [15-25] 23 (11/01 0341) BP: (84-109)/(54-72) 101/65 (11/01 0341) SpO2:  [91 %-97 %] 97 % (11/01 0341) Weight:  [86.4 kg-86.5 kg] 86.4 kg (11/01 0341) Last BM Date : 12/02/21  Weight change: Filed Weights   12/04/21 1322 12/04/21 1751 12/05/21 0341  Weight: 86.5 kg 86.5 kg 86.4 kg    Intake/Output:   Intake/Output Summary (Last 24 hours) at 12/05/2021 0819 Last data filed at 12/05/2021 0337 Gross per 24 hour  Intake 548.55 ml  Output 375 ml  Net 173.55 ml      Physical Exam    General:   No resp difficulty HEENT: normal Neck: supple. no JVD. Carotids 2+ bilat; no bruits. No lymphadenopathy or thryomegaly appreciated. Cor: PMI nondisplaced. Regular rate & rhythm. No rubs, gallops or murmurs. Lungs: clear Abdomen: soft, nontender, nondistended. No hepatosplenomegaly. No bruits or masses. Good bowel sounds. Extremities: no cyanosis, clubbing, rash, edema Neuro: alert & orientedx3, cranial nerves grossly intact. moves all 4 extremities w/o difficulty. Affect pleasant  Telemetry  V Paced 80s   Labs    CBC Recent Labs    12/03/21 0308 12/04/21 0357  WBC 5.2 10.3  HGB 12.7* 14.7  HCT 38.5* 45.1  MCV 94.6 93.8  PLT 128* 326   Basic Metabolic Panel Recent Labs    12/04/21 0357 12/05/21 0557  NA 137 136  K 4.4 4.4  CL 107 100  CO2 19* 22  GLUCOSE 145* 129*  BUN 16 25*  CREATININE 1.42* 1.73*  CALCIUM 8.9 9.2  MG 2.0 2.2   Liver Function Tests Recent Labs    12/03/21 0308  AST 20  ALT 20  ALKPHOS 59   BILITOT <0.1*  PROT 6.5  ALBUMIN 3.4*   No results for input(s): "LIPASE", "AMYLASE" in the last 72 hours. Cardiac Enzymes No results for input(s): "CKTOTAL", "CKMB", "CKMBINDEX", "TROPONINI" in the last 72 hours.  BNP: BNP (last 3 results) No results for input(s): "BNP" in the last 8760 hours.  ProBNP (last 3 results) No results for input(s): "PROBNP" in the last 8760 hours.   D-Dimer No results for input(s): "DDIMER" in the last 72 hours. Hemoglobin A1C No results for input(s): "HGBA1C" in the last 72 hours. Fasting Lipid Panel No results for input(s): "CHOL", "HDL", "LDLCALC", "TRIG", "CHOLHDL", "LDLDIRECT" in the last 72 hours. Thyroid Function Tests No results for input(s): "TSH", "T4TOTAL", "T3FREE", "THYROIDAB" in the last 72 hours.  Invalid input(s): "FREET3"  Other results:   Imaging    No results found.   Medications:     Scheduled Medications:  apixaban  5 mg Oral BID   bisoprolol  2.5 mg Oral Daily   dapagliflozin propanediol  10 mg Oral Daily   digoxin  0.0625 mg Oral Daily   potassium chloride  10 mEq Oral BID   sacubitril-valsartan  1 tablet Oral BID   simvastatin  40 mg Oral Daily   sodium chloride flush  3 mL Intravenous Q12H   sodium chloride flush  3 mL Intravenous Q12H   spironolactone  25 mg Oral Daily    Infusions:  sodium chloride     sodium chloride     sodium chloride 10 mL/hr at 12/05/21 0659   amiodarone 30 mg/hr (12/05/21 0740)    PRN Medications: sodium chloride, sodium chloride, acetaminophen, ALPRAZolam, melatonin, ondansetron (ZOFRAN) IV, sodium chloride flush, sodium chloride flush, zolpidem    Patient Profile   71 y/o male w/ 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   Of note, he had a colonoscopy last week and had significant fluid loss with the prep. Mg 1.6 on admission, K 3.8. QT prolonged.   Assessment/Plan   1. VT  Storm/ ICD Shock  - 14 shocks.  - in setting of chronic systolic HF + Tikosyn use for AFib + hypomagnesemia  - not all strips are included with the report but it looks like he received 14 total ICD shocks in VF zone  - QT prolonged, 562 ms, Mg low 1.6, suspect related to recent bowel prep for colonoscopy  - Keep Mg > 2.0 and K > 4.0  - Tikosyn stopped. Loading on amio drip.  -Loading with IV amiodarone today. - K/Mag stable today  - NCDMV- discussed no driving 6 months after shock.  2. Chronic Systolic Heart Failure - NICM, EF 15% on echo in 8/16.  Patient had borderline reduced EF when PPM was placed in 2/15.  After that, EF fell significantly.  He has a history of complete heart block and father had CHF and CHB with pacemaker, and sounds like sister also had CHB with pacemaker. 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  He had CRT-D upgrade.  No significant CAD on last cath.  Echo in 4/17 showed improvement in EF to 30-35%.  Echo in 5/18 showed EF down to 20%.  Echo in 12/21 shows EF < 20% and moderately dilated. Echo this admit with EF 15%. His LV lead became dislodged and he is no longer BiV pacing, now RV pacing > 99% of the time.  Given minimal symptoms, decision was made not to revise his LV lead. Would like EP to consider re-attempting some form of CRT (CS lead versus left bundle lead).   -NYHA Class II. Volume status stable.  Renal function trending up. Suspect he is dry. Adding pre cath IV fluids.  - continue Farxiga 10 mg daily  - continue Entresto 24-26 mg bid - continue spironolactone 25 mg daily  - continue digoxin 0.125 mg daily, dig level stable.  - continue bisoprolol 2.5 mg daily -Hold off on hydralazine/imdur.  - RHC later today.   3. PAF - Off Tikosyn as above.On amio drip.  - on Eliquis - Now on amio  4. Elevated Trop - Hs trop 208, suspect demand ischemia in setting of VT  - no ischemic CP   5. Stage IIIa CKD - SCr  baseline 1.3-1.5--> today 1.7  - Appear dry on exam. Start pre cath IV fluids.  - follow BMP   6. Blood tinged sputum - No episodes over night.    RHC today. Start NS at 50 cc per hour.    Length of Stay: 2  Darrick Grinder, NP  12/05/2021, 8:19 AM  Advanced Heart Failure Team Pager [phone] (M-F; 7a - 5p)  Please contact [facility] for night-coverage after hours (5p -7a ) and weekends on  [email or web address]  Patient seen with NP, agree with the above note.   No further VT, he remains on IV amiodarone. SBP in 100s, this is his baseline.   Creatinine up to 1.7 today.  Did not eat/drink much yesterday.   General: NAD Neck: No JVD, no thyromegaly or thyroid nodule.  Lungs: Clear to auscultation bilaterally with normal respiratory effort. CV: Nondisplaced PMI.  Heart regular S1/S2, no S3/S4, no murmur.  No peripheral edema.   Abdomen: Soft, nontender, no hepatosplenomegaly, no distention.  Skin: Intact without lesions or rashes.  Neurologic: Alert and oriented x 3.  Psych: Normal affect. Extremities: No clubbing or cyanosis.  HEENT: Normal.   Suspect VT/VF in setting of Tikosyn use with electrolyte abnormalities post-colonoscopy, baseline QTc is borderline for Tikosyn use. Doubt ACS, elevated troponin is likely due to multiple shocks.  Tikosyn was stopped.   - Transition from IV amiodarone to amiodarone 400 mg bid as long as EP in agreement.   - No driving x 6 months.    Echo this admission shows that EF remains markedly low at 15% with mild RV dysfunction.  Though VT is likely most related to Tikosyn and electrolyte abnormalities, I would like to invasively assess his filling pressures and cardiac output.  - RHC today.  Discussed risks/benefits with patient and he agrees to procedure.  - Continue current GDMT.  No BP room for hydralazine/Imdur.  - RV paces > 95% of the time, LV lead has been dislodged.  With minimal symptoms in the past, have not re-attempted CRT. Would like EP to  consider re-attempting some form of CRT (CS lead versus left bundle lead), especially if cardiac output is low on RHC today.  He will need generator change soon regardless as he drained device considerably with multiple shocks.   Loralie Champagne 12/05/2021 9:17 AM

## 2021-12-05 NOTE — Telephone Encounter (Signed)
Per message below, Dr. Lovena Le stated he would round on the patient tomorrow morning, 12/05/21

## 2021-12-05 NOTE — Progress Notes (Signed)
CCMD called and pt had 5 beat run of v-tach with no trace of pacer mark. On-call MD made aware. No new orders.

## 2021-12-05 NOTE — Progress Notes (Signed)
Mobility Specialist - Progress Note   12/05/21 0937  Mobility  Activity Off unit    Off unit for cath. Will follow up.   Larey Seat

## 2021-12-05 NOTE — Progress Notes (Signed)
Pt had PRN Ambiem at bedtime 10/31 and was after unsteady on his feet.

## 2021-12-05 NOTE — Progress Notes (Signed)
  Amiodarone Drug - Drug Interaction Consult Note  Recommendations: Change Zocor '40mg'$  to Lipitor '20mg'$  per protocol  Amiodarone is metabolized by the cytochrome P450 system and therefore has the potential to cause many drug interactions. Amiodarone has an average plasma half-life of 50 days (range 20 to 100 days).   There is potential for drug interactions to occur several weeks or months after stopping treatment and the onset of drug interactions may be slow after initiating amiodarone.   '[x]'$  Statins: Increased risk of myopathy. Simvastatin- restrict dose to '20mg'$  daily. Other statins: counsel patients to report any muscle pain or weakness immediately.  '[]'$  Anticoagulants: Amiodarone can increase anticoagulant effect. Consider warfarin dose reduction. Patients should be monitored closely and the dose of anticoagulant altered accordingly, remembering that amiodarone levels take several weeks to stabilize.  '[]'$  Antiepileptics: Amiodarone can increase plasma concentration of phenytoin, the dose should be reduced. Note that small changes in phenytoin dose can result in large changes in levels. Monitor patient and counsel on signs of toxicity.  '[]'$  Beta blockers: increased risk of bradycardia, AV block and myocardial depression. Sotalol - avoid concomitant use.  '[]'$   Calcium channel blockers (diltiazem and verapamil): increased risk of bradycardia, AV block and myocardial depression.  '[]'$   Cyclosporine: Amiodarone increases levels of cyclosporine. Reduced dose of cyclosporine is recommended.  '[]'$  Digoxin dose should be halved when amiodarone is started.  '[]'$  Diuretics: increased risk of cardiotoxicity if hypokalemia occurs.  '[]'$  Oral hypoglycemic agents (glyburide, glipizide, glimepiride): increased risk of hypoglycemia. Patient's glucose levels should be monitored closely when initiating amiodarone therapy.   '[]'$  Drugs that prolong the QT interval:  Torsades de pointes risk may be increased with  concurrent use - avoid if possible.  Monitor QTc, also keep magnesium/potassium WNL if concurrent therapy can't be avoided.  Antibiotics: e.g. fluoroquinolones, erythromycin.  Antiarrhythmics: e.g. quinidine, procainamide, disopyramide, sotalol.  Antipsychotics: e.g. phenothiazines, haloperidol.   Lithium, tricyclic antidepressants, and methadone.

## 2021-12-05 NOTE — Progress Notes (Signed)
Pt had 11 beat run of wide QRS complexes. Pt asymptomatic during this. On-call MD made aware. No new orders.

## 2021-12-05 NOTE — Progress Notes (Addendum)
Advanced Heart Failure Rounding Note  PCP-Cardiologist: None   Subjective:   Admitted with VT Storm.   Off Tikosyn. Loading with IV amiodarone.   Echo 10/30: EF 15%, severely dilated LV, RV mildly reduced, mild MR, mild AI   Feels ok.   Objective:   Weight Range: 86.4 kg Body mass index is 25.13 kg/m.   Vital Signs:   Temp:  [97.9 F (36.6 C)-98.6 F (37 C)] 97.9 F (36.6 C) (11/01 0341) Pulse Rate:  [58-101] 89 (11/01 0341) Resp:  [15-25] 23 (11/01 0341) BP: (84-109)/(54-72) 101/65 (11/01 0341) SpO2:  [91 %-97 %] 97 % (11/01 0341) Weight:  [86.4 kg-86.5 kg] 86.4 kg (11/01 0341) Last BM Date : 12/02/21  Weight change: Filed Weights   12/04/21 1322 12/04/21 1751 12/05/21 0341  Weight: 86.5 kg 86.5 kg 86.4 kg    Intake/Output:   Intake/Output Summary (Last 24 hours) at 12/05/2021 0819 Last data filed at 12/05/2021 0337 Gross per 24 hour  Intake 548.55 ml  Output 375 ml  Net 173.55 ml      Physical Exam    General:   No resp difficulty HEENT: normal Neck: supple. no JVD. Carotids 2+ bilat; no bruits. No lymphadenopathy or thryomegaly appreciated. Cor: PMI nondisplaced. Regular rate & rhythm. No rubs, gallops or murmurs. Lungs: clear Abdomen: soft, nontender, nondistended. No hepatosplenomegaly. No bruits or masses. Good bowel sounds. Extremities: no cyanosis, clubbing, rash, edema Neuro: alert & orientedx3, cranial nerves grossly intact. moves all 4 extremities w/o difficulty. Affect pleasant  Telemetry  V Paced 80s   Labs    CBC Recent Labs    12/03/21 0308 12/04/21 0357  WBC 5.2 10.3  HGB 12.7* 14.7  HCT 38.5* 45.1  MCV 94.6 93.8  PLT 128* 947   Basic Metabolic Panel Recent Labs    12/04/21 0357 12/05/21 0557  NA 137 136  K 4.4 4.4  CL 107 100  CO2 19* 22  GLUCOSE 145* 129*  BUN 16 25*  CREATININE 1.42* 1.73*  CALCIUM 8.9 9.2  MG 2.0 2.2   Liver Function Tests Recent Labs    12/03/21 0308  AST 20  ALT 20  ALKPHOS 59   BILITOT <0.1*  PROT 6.5  ALBUMIN 3.4*   No results for input(s): "LIPASE", "AMYLASE" in the last 72 hours. Cardiac Enzymes No results for input(s): "CKTOTAL", "CKMB", "CKMBINDEX", "TROPONINI" in the last 72 hours.  BNP: BNP (last 3 results) No results for input(s): "BNP" in the last 8760 hours.  ProBNP (last 3 results) No results for input(s): "PROBNP" in the last 8760 hours.   D-Dimer No results for input(s): "DDIMER" in the last 72 hours. Hemoglobin A1C No results for input(s): "HGBA1C" in the last 72 hours. Fasting Lipid Panel No results for input(s): "CHOL", "HDL", "LDLCALC", "TRIG", "CHOLHDL", "LDLDIRECT" in the last 72 hours. Thyroid Function Tests No results for input(s): "TSH", "T4TOTAL", "T3FREE", "THYROIDAB" in the last 72 hours.  Invalid input(s): "FREET3"  Other results:   Imaging    No results found.   Medications:     Scheduled Medications:  apixaban  5 mg Oral BID   bisoprolol  2.5 mg Oral Daily   dapagliflozin propanediol  10 mg Oral Daily   digoxin  0.0625 mg Oral Daily   potassium chloride  10 mEq Oral BID   sacubitril-valsartan  1 tablet Oral BID   simvastatin  40 mg Oral Daily   sodium chloride flush  3 mL Intravenous Q12H   sodium chloride flush  3 mL Intravenous Q12H   spironolactone  25 mg Oral Daily    Infusions:  sodium chloride     sodium chloride     sodium chloride 10 mL/hr at 12/05/21 0659   amiodarone 30 mg/hr (12/05/21 0740)    PRN Medications: sodium chloride, sodium chloride, acetaminophen, ALPRAZolam, melatonin, ondansetron (ZOFRAN) IV, sodium chloride flush, sodium chloride flush, zolpidem    Patient Profile   71 y/o male w/ 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   Of note, he had a colonoscopy last week and had significant fluid loss with the prep. Mg 1.6 on admission, K 3.8. QT prolonged.   Assessment/Plan   1. VT  Storm/ ICD Shock  - 14 shocks.  - in setting of chronic systolic HF + Tikosyn use for AFib + hypomagnesemia  - not all strips are included with the report but it looks like he received 14 total ICD shocks in VF zone  - QT prolonged, 562 ms, Mg low 1.6, suspect related to recent bowel prep for colonoscopy  - Keep Mg > 2.0 and K > 4.0  - Tikosyn stopped. Loading on amio drip.  -Loading with IV amiodarone today. - K/Mag stable today  - NCDMV- discussed no driving 6 months after shock.  2. Chronic Systolic Heart Failure - NICM, EF 15% on echo in 8/16.  Patient had borderline reduced EF when PPM was placed in 2/15.  After that, EF fell significantly.  He has a history of complete heart block and father had CHF and CHB with pacemaker, and sounds like sister also had CHB with pacemaker. 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  He had CRT-D upgrade.  No significant CAD on last cath.  Echo in 4/17 showed improvement in EF to 30-35%.  Echo in 5/18 showed EF down to 20%.  Echo in 12/21 shows EF < 20% and moderately dilated. Echo this admit with EF 15%. His LV lead became dislodged and he is no longer BiV pacing, now RV pacing > 99% of the time.  Given minimal symptoms, decision was made not to revise his LV lead. Would like EP to consider re-attempting some form of CRT (CS lead versus left bundle lead).   -NYHA Class II. Volume status stable.  Renal function trending up. Suspect he is dry. Adding pre cath IV fluids.  - continue Farxiga 10 mg daily  - continue Entresto 24-26 mg bid - continue spironolactone 25 mg daily  - continue digoxin 0.125 mg daily, dig level stable.  - continue bisoprolol 2.5 mg daily -Hold off on hydralazine/imdur.  - RHC later today.   3. PAF - Off Tikosyn as above.On amio drip.  - on Eliquis - Now on amio  4. Elevated Trop - Hs trop 208, suspect demand ischemia in setting of VT  - no ischemic CP   5. Stage IIIa CKD - SCr  baseline 1.3-1.5--> today 1.7  - Appear dry on exam. Start pre cath IV fluids.  - follow BMP   6. Blood tinged sputum - No episodes over night.    RHC today. Start NS at 50 cc per hour.    Length of Stay: 2  Darrick Grinder, NP  12/05/2021, 8:19 AM  Advanced Heart Failure Team Pager [phone] (M-F; 7a - 5p)  Please contact [facility] for night-coverage after hours (5p -7a ) and weekends on  [email or web address]  Patient seen with NP, agree with the above note.   No further VT, he remains on IV amiodarone. SBP in 100s, this is his baseline.   Creatinine up to 1.7 today.  Did not eat/drink much yesterday.   General: NAD Neck: No JVD, no thyromegaly or thyroid nodule.  Lungs: Clear to auscultation bilaterally with normal respiratory effort. CV: Nondisplaced PMI.  Heart regular S1/S2, no S3/S4, no murmur.  No peripheral edema.   Abdomen: Soft, nontender, no hepatosplenomegaly, no distention.  Skin: Intact without lesions or rashes.  Neurologic: Alert and oriented x 3.  Psych: Normal affect. Extremities: No clubbing or cyanosis.  HEENT: Normal.   Suspect VT/VF in setting of Tikosyn use with electrolyte abnormalities post-colonoscopy, baseline QTc is borderline for Tikosyn use. Doubt ACS, elevated troponin is likely due to multiple shocks.  Tikosyn was stopped.   - Transition from IV amiodarone to amiodarone 400 mg bid as long as EP in agreement.   - No driving x 6 months.    Echo this admission shows that EF remains markedly low at 15% with mild RV dysfunction.  Though VT is likely most related to Tikosyn and electrolyte abnormalities, I would like to invasively assess his filling pressures and cardiac output.  - RHC today.  Discussed risks/benefits with patient and he agrees to procedure.  - Continue current GDMT.  No BP room for hydralazine/Imdur.  - RV paces > 95% of the time, LV lead has been dislodged.  With minimal symptoms in the past, have not re-attempted CRT. Would like EP to  consider re-attempting some form of CRT (CS lead versus left bundle lead), especially if cardiac output is low on RHC today.  He will need generator change soon regardless as he drained device considerably with multiple shocks.   Loralie Champagne 12/05/2021 9:17 AM  RHC today showed low output, CI 1.5-1.6.  PCWP 22.  Will give Lasix IV today, back to po tomorrow.   With low output HF, discussed attempt to upgrade to CRT with Dr. Lovena Le, will arrange as outpatient.  He describes minimal symptoms despite abnormal RHC, suspect he has learned to compensate for low output.  I worry that he could be approaching need for LVAD.  He will need CPX and close followup.   As outpatient, would discuss again gene testing for LMNA cardiomyopathy etc given CHB, CHF, and family history. He refused in the past.   Loralie Champagne 12/05/2021 11:26 AM

## 2021-12-05 NOTE — Progress Notes (Signed)
Eliquis , Entresto, and Spironolactone held for pt to go down for right heart cath this morning.

## 2021-12-05 NOTE — Progress Notes (Signed)
  Transition of Care [facility]) Screening Note   Patient Details  Name: Philip Chavez Date of Birth: [birth year]   Transition of Care [facility]) CM/SW Contact:    Milas Gain, [place] Phone Number: 12/05/2021, 4:07 PM    Transition of Care Department [facility]) has reviewed patient and no TOC needs have been identified at this time. We will continue to monitor patient advancement through interdisciplinary progression rounds. If new patient transition needs arise, please place a TOC consult.

## 2021-12-05 NOTE — Progress Notes (Cosign Needed)
Electrophysiology Rounding Note  Patient Name: Philip Chavez Date of Encounter: 12/05/2021  Primary Cardiologist: None Electrophysiologist: [name], MD   Subjective   NAEON. Transferred to 6e yesterday, appreciates the quieter room Able to sleep well, good appetite, denies early satiety or bloating NPO for RHC  Inpatient Medications    Scheduled Meds:  apixaban  5 mg Oral BID   bisoprolol  2.5 mg Oral Daily   dapagliflozin propanediol  10 mg Oral Daily   digoxin  0.0625 mg Oral Daily   potassium chloride  10 mEq Oral BID   sacubitril-valsartan  1 tablet Oral BID   simvastatin  40 mg Oral Daily   sodium chloride flush  3 mL Intravenous Q12H   sodium chloride flush  3 mL Intravenous Q12H   spironolactone  25 mg Oral Daily   Continuous Infusions:  sodium chloride     sodium chloride     sodium chloride 10 mL/hr at 12/05/21 0659   amiodarone 30 mg/hr (12/05/21 0740)   PRN Meds: sodium chloride, sodium chloride, acetaminophen, ALPRAZolam, melatonin, ondansetron (ZOFRAN) IV, sodium chloride flush, sodium chloride flush, zolpidem   Vital Signs    Vitals:   12/04/21 2006 12/04/21 2320 12/05/21 0341 12/05/21 0825  BP: 90/70 (!) 94/59 101/65 102/73  Pulse: 62 80 89 87  Resp: 17 20 (!) 23 18  Temp: 98.3 F (36.8 C) 98 F (36.7 C) 97.9 F (36.6 C) 98.1 F (36.7 C)  TempSrc: Oral Axillary Axillary Axillary  SpO2:  97% 97% 95%  Weight:   86.4 kg   Height:        Intake/Output Summary (Last 24 hours) at 12/05/2021 0857 Last data filed at 12/05/2021 8299 Gross per 24 hour  Intake 548.55 ml  Output 375 ml  Net 173.55 ml   Filed Weights   12/04/21 1322 12/04/21 1751 12/05/21 0341  Weight: 86.5 kg 86.5 kg 86.4 kg    Physical Exam    Grossly unchanged from prior GEN- The patient is well appearing, alert and oriented x 3 today, falling asleep during lulls in conversation but awakens quickly Head- normocephalic, atraumatic Eyes-  Sclera clear, conjunctiva  pink Ears- hearing intact Oropharynx- clear Neck- supple Lungs- Clear to ausculation bilaterally, normal work of breathing Heart- irregular rate and rhythm, no murmurs, rubs or gallops GI- soft, NT, ND, + BS Extremities- no clubbing or cyanosis. No edema Skin- no rash or lesion Psych- euthymic mood, full affect Neuro- strength and sensation are intact  Labs    CBC Recent Labs    12/03/21 0308 12/04/21 0357  WBC 5.2 10.3  HGB 12.7* 14.7  HCT 38.5* 45.1  MCV 94.6 93.8  PLT 128* 371   Basic Metabolic Panel Recent Labs    12/04/21 0357 12/05/21 0557  NA 137 136  K 4.4 4.4  CL 107 100  CO2 19* 22  GLUCOSE 145* 129*  BUN 16 25*  CREATININE 1.42* 1.73*  CALCIUM 8.9 9.2  MG 2.0 2.2   Liver Function Tests Recent Labs    12/03/21 0308  AST 20  ALT 20  ALKPHOS 59  BILITOT <0.1*  PROT 6.5  ALBUMIN 3.4*   No results for input(s): "LIPASE", "AMYLASE" in the last 72 hours. Cardiac Enzymes No results for input(s): "CKTOTAL", "CKMB", "CKMBINDEX", "TROPONINI" in the last 72 hours.   Telemetry    AV-paced, irregular rhythm; no VT (personally reviewed)  Radiology    ECHOCARDIOGRAM COMPLETE  Result Date: 12/03/2021    ECHOCARDIOGRAM REPORT  Patient Name:   Philip Chavez Date of Exam: 12/03/2021 Medical Rec #:  [id number]     Height:       73.0 in Accession #:    [id number]    Weight:       195.1 lb Date of Birth:  1950-11-24    BSA:          2.129 m Patient Age:    71 years      BP:           96/84 mmHg Patient Gender: M             HR:           75 bpm. Exam Location:  Inpatient Procedure: 2D Echo, Cardiac Doppler, Color Doppler and Intracardiac            Opacification Agent Indications:    ventricular fibrillation  History:        Patient has prior history of Echocardiogram examinations, most                 recent 09/03/2021. Cardiomegaly and CHF, Abnormal ECG and                 Defibrillator; Arrythmias:Ventricular Fibrillation and 3rd                 degree heart  block.  Sonographer:    Melissa Morford RDCS (AE, PE) Referring Phys: Elby Showers MCLEAN IMPRESSIONS  1. Left ventricular ejection fraction, by estimation, is 15%. The left ventricle has severely decreased function. The left ventricle demonstrates global hypokinesis. The left ventricular internal cavity size was severely dilated. Left ventricular diastolic parameters are indeterminate.  2. Right ventricular systolic function is mildly reduced. The right ventricular size is mildly enlarged.  3. Left atrial size was mildly dilated.  4. The mitral valve is grossly normal. Mild mitral valve regurgitation. No evidence of mitral stenosis.  5. The aortic valve is tricuspid. Aortic valve regurgitation is mild. No aortic stenosis is present.  6. Aortic dilatation noted. There is mild dilatation of the aortic root, measuring 41 mm. Conclusion(s)/Recommendation(s): No left ventricular mural or apical thrombus/thrombi, swirling contrast is seen. FINDINGS  Left Ventricle: Left ventricular ejection fraction, by estimation, is 15%. The left ventricle has severely decreased function. The left ventricle demonstrates global hypokinesis. The left ventricular internal cavity size was severely dilated. There is no left ventricular hypertrophy. Left ventricular diastolic parameters are indeterminate. Right Ventricle: The right ventricular size is mildly enlarged. No increase in right ventricular wall thickness. Right ventricular systolic function is mildly reduced. Left Atrium: Left atrial size was mildly dilated. Right Atrium: Right atrial size was normal in size. Pericardium: There is no evidence of pericardial effusion. Mitral Valve: The mitral valve is grossly normal. Mild mitral valve regurgitation. No evidence of mitral valve stenosis. Tricuspid Valve: The tricuspid valve is normal in structure. Tricuspid valve regurgitation is mild . No evidence of tricuspid stenosis. Aortic Valve: The aortic valve is tricuspid. Aortic valve  regurgitation is mild. Aortic regurgitation PHT measures 797 msec. No aortic stenosis is present. Pulmonic Valve: The pulmonic valve was thickened with good excursion. Pulmonic valve regurgitation is not visualized. No evidence of pulmonic stenosis. Aorta: Aortic dilatation noted. There is mild dilatation of the aortic root, measuring 41 mm. Venous: The inferior vena cava was not well visualized. IAS/Shunts: No atrial level shunt detected by color flow Doppler. Additional Comments: A device lead is visualized in the right atrium and right ventricle.  LEFT  VENTRICLE PLAX 2D LVIDd:         7.30 cm   Diastology LVIDs:         6.90 cm   LV e' medial:    5.55 cm/s LV PW:         0.80 cm   LV E/e' medial:  25.4 LV IVS:        0.80 cm   LV e' lateral:   8.03 cm/s LVOT diam:     2.60 cm   LV E/e' lateral: 17.6 LV SV:         87 LV SV Index:   41 LVOT Area:     5.31 cm  RIGHT VENTRICLE RV S prime:     13.40 cm/s LEFT ATRIUM           Index        RIGHT ATRIUM           Index LA diam:      4.40 cm 2.07 cm/m   RA Area:     19.20 cm LA Vol (A4C): 79.1 ml 37.15 ml/m  RA Volume:   56.30 ml  26.44 ml/m  AORTIC VALVE LVOT Vmax:   78.20 cm/s LVOT Vmean:  53.800 cm/s LVOT VTI:    0.163 m AI PHT:      797 msec  AORTA Ao Root diam: 4.10 cm Ao Asc diam:  3.30 cm MITRAL VALVE                TRICUSPID VALVE MV Area (PHT): 7.16 cm     TR Peak grad:   18.3 mmHg MV Decel Time: 106 msec     TR Vmax:        214.00 cm/s MV E velocity: 141.00 cm/s                             SHUNTS                             Systemic VTI:  0.16 m                             Systemic Diam: 2.60 cm Cherlynn Kaiser MD Electronically signed by Cherlynn Kaiser MD Signature Date/Time: 12/03/2021/4:05:37 PM    Final     Patient Profile     Philip Chavez is a 71 y.o. 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  he was admitted for  multiple VT therapies.   Assessment & Plan    #) VT storm #) CHB s/p ICD #) AFib Appropriate device therapies No further VT RV paced >95% LV lead had increased threshold d/t dislodgement, so currently off Tikosyn stopped Likely transition to PO amio today since no VT on tele CHA2DS2-VASc Score = 6 (CHF, HTN, DM, Stroke, age) AC with eliquis, appriprioately dosed at '5mg'$  BID, should continue Keep K >4, Mag > 2  Will need outpatient f/u to discuss gen change +/- ICD system revision  Dr. Lovena Le to see to make final plan    #) HFrEF NPO for RHC today Appreciate HF team's assistance with mgmt   For questions or updates, please contact [facility] Please consult [email or web address] for contact info under Cardiology/STEMI.  Signed, Mamie Levers,  NP  12/05/2021, 8:57 AM   EP Attending  Patient seen and examined. Agree with above. The patients right heart cath data have been discussed and reviewed. He has been given 24 hours of IV amio and IV lasix with a nice diuresis. He will be discharged home with oral outpatient amiodarone load followed by followup with me to discuss extraction and BIV upgrade. His CI is very low.  Carleene Overlie Eldean Klatt,MD

## 2021-12-05 NOTE — Progress Notes (Signed)
Mobility Specialist - Progress Note   12/05/21 1603  Mobility  Activity Ambulated with assistance in hallway  Level of Assistance Standby assist, set-up cues, supervision of patient - no hands on  Assistive Device None  Distance Ambulated (ft) 470 ft  Activity Response Tolerated well  Mobility Referral Yes  $Mobility charge 1 Mobility   Pt received in chair and agreeable. No complaints throughout. Pt left in chair with all needs met.  Larey Seat

## 2021-12-05 NOTE — Discharge Summary (Incomplete)
ELECTROPHYSIOLOGY DISCHARGE SUMMARY    Patient ID: Philip Chavez,  MRN: [id number], DOB/AGE: 10/13/1950 71 y.o.  Admit date: 12/03/2021 Discharge date: 12/06/2021  Primary Care Physician: Ezequiel Essex, MD  Primary Cardiologist: None  Electrophysiologist: Dr. Lovena Le   Primary Discharge Diagnosis:  VT Storm  Secondary Discharge Diagnosis:  Paroxysmal Afib HFrEF NICM CHB s/p medtronic CRT-d  Procedures This Admission:  [place] 12/05/21 -  1. Elevated filling pressures.  2. Mild mixed pulmonary venous/pulmonary arterial HTN 3. Low cardiac output by Fick and thermodilution   Brief HPI: Philip Chavez is a 71 y.o. male with who developed complete heart block in 03/2013 and had PPM placed at that time.  He paces his RV continuously.  In 03/2013, EF was 50-55% by echo.  By 07/2013, EF had fallen to 20-25%.  Cardiolite showed possible scar but no ischemia.  On 09/03/14, he was admitted with dyspnea and found to be in atrial flutter with RLL PNA and with volume overload.  Echo showed EF 15% with diffuse hypokinesis.  He was started on IV Lasix and IV amiodarone. He converted back to NSR.  He subsequently had atrial flutter ablation.  He had right and left heart cath in 10/2014, showing nonobstructive CAD and optimized filling pressures with CI 2.09.  He had upgrade to CRT by Dr Lovena Le.  EF up to 30-35% in 05/2015. Subsequently, the LV lead dislodged.  He was offered lead revision, but preferred watchful waiting. Echos since then have shown LVEF < 20%, most recently, echo 08/2021 showed EF 15-20%, RV mildly reduced, mild to moderate MR.    Passed out 10/26/21, device showed sustained VT with successful shock. Follow up labs OK, ECG showed QTc 583 msec. MDT device interrogation at his last visit on 11/16/2021 showed 99% RV pacing, stable thoracic impedance, 2 hr/day activity, VT/VF episode 10/25/21, short bursts of AF   He had a colonoscopy last week with significant fluid loss. He began having some  sinus drainage over the weekend, took "Robitussin" on Saturday, unsure if it was with decongestant. Yesterday had LOC x 2, then woke up and was being shocked by device. Patient felt a little dizzy prior to LOC episodes.   Hospital Course:  The patient was admitted and tikosyn stopped. Following a washout of 24hours, patient was loaded on IV amiodarone with gtt to follow. His VT remained quiescent, and he was transition to PO amiodarone 11/1. The HF team performed a [place] 11/1 with elevated filling pressure, and recommended the patient receive a day of IV lasix to facilitate diuresis. At discharge, his diuretics were increased. The patient was agreeable to this and was discharged 11/2.   They were monitored on telemetry throughout hospitalization which demonstrated SR with intermittent afib. The patient was examined by Dr. Lovena Le and considered to be stable for discharge. The patient will be seen back by Dr. Lovena Le in 4 weeks weeks for post hospital care and to discuss device revision and gen change.   Physical Exam: Vitals:   12/05/21 1503 12/05/21 1940 12/06/21 0358 12/06/21 0832  BP: 99/62 96/64 (!) 96/54 95/65  Pulse:  64 67 71  Resp: 18   16  Temp: 97.6 F (36.4 C) 98.2 F (36.8 C) 97.8 F (36.6 C) 97.9 F (36.6 C)  TempSrc: Oral Oral Oral Oral  SpO2: 98% 95% 97% 98%  Weight:   84.6 kg   Height:        GEN- The patient is well appearing, thin, alert and oriented x 3  today.   HEENT: normocephalic, atraumatic; sclera clear, conjunctiva pink; hearing intact; oropharynx clear; neck supple  Lungs- Clear to ausculation bilaterally, normal work of breathing.  No wheezes, rales, rhonchi Heart- Regular rate and rhythm, no murmurs, rubs or gallops  GI- soft, non-tender, non-distended, bowel sounds present  Extremities- no clubbing, cyanosis, or edema; DP/PT/radial pulses 2+ bilaterally, groin without hematoma/bruit MS- no significant deformity or atrophy Skin- warm and dry, no rash or  lesion Psych- euthymic mood, full affect Neuro- strength and sensation are intact   Labs:   Lab Results  Component Value Date   WBC 10.3 12/04/2021   HGB 14.7 12/04/2021   HCT 45.1 12/04/2021   MCV 93.8 12/04/2021   PLT 161 12/04/2021    Recent Labs  Lab 12/03/21 0308 12/04/21 0357 12/06/21 0214  NA 137   < > 137  K 3.8   < > 4.0  CL 104   < > 100  CO2 21*   < > 25  BUN 15   < > 27*  CREATININE 1.46*   < > 1.66*  CALCIUM 8.7*   < > 8.5*  PROT 6.5  --   --   BILITOT <0.1*  --   --   ALKPHOS 59  --   --   ALT 20  --   --   AST 20  --   --   GLUCOSE 138*   < > 116*   < > = values in this interval not displayed.     Discharge Medications:  Allergies as of 12/06/2021   No Known Allergies      Medication List     STOP taking these medications    dofetilide 500 MCG capsule Commonly known as: TIKOSYN   furosemide 20 MG tablet Commonly known as: LASIX   hydrALAZINE 25 MG tablet Commonly known as: APRESOLINE   isosorbide mononitrate 30 MG 24 hr tablet Commonly known as: IMDUR   polyethylene glycol-electrolytes 420 g solution Commonly known as: NuLYTELY       TAKE these medications    acetaminophen 325 MG tablet Commonly known as: TYLENOL Take 650 mg by mouth every 6 (six) hours as needed for moderate pain.   amiodarone 200 MG tablet Commonly known as: PACERONE Take 2 tablets (400 mg total) by mouth 2 (two) times daily. Then reduce to '400mg'$  (2 tablets) daily   bisoprolol 5 MG tablet Commonly known as: ZEBETA Take 0.5 tablets (2.5 mg total) by mouth daily.   CertaVite/Antioxidants Tabs Take 1 tablet by mouth daily with breakfast.   Digoxin 62.5 MCG Tabs Take 0.0625 mg by mouth daily. What changed:  medication strength how much to take   Eliquis 5 MG Tabs tablet Generic drug: apixaban Take 1 tablet (5 mg total) by mouth 2 (two) times daily.   Entresto 24-26 MG Generic drug: sacubitril-valsartan Take 1 tablet by mouth 2 (two) times  daily.   Farxiga 10 MG Tabs tablet Generic drug: dapagliflozin propanediol Take 1 tablet by mouth daily. Absolute last refill please call [phone] to schedule follow up   oxymetazoline 0.05 % nasal spray Commonly known as: AFRIN Place 1 spray into both nostrils 2 (two) times daily as needed for congestion.   potassium chloride 10 MEQ tablet Commonly known as: KLOR-CON TAKE 1 TABLET BY MOUTH 2 TIMES DAILY.   simvastatin 40 MG tablet Commonly known as: ZOCOR TAKE 1 TABLET BY MOUTH EVERY DAY   spironolactone 25 MG tablet Commonly known as: ALDACTONE TAKE 1 TABLET  BY MOUTH ONCE A DAY   torsemide 20 MG tablet Commonly known as: DEMADEX Take 1 tablet (20 mg total) by mouth daily.   Voltaren 1 % Gel Generic drug: diclofenac Sodium Apply 1 Application topically 4 (four) times daily as needed (pain).        Disposition:  Discharge Instructions     (HEART FAILURE PATIENTS) Call MD:  Anytime you have any of the following symptoms: 1) 3 pound weight gain in 24 hours or 5 pounds in 1 week 2) shortness of breath, with or without a dry hacking cough 3) swelling in the hands, feet or stomach 4) if you have to sleep on extra pillows at night in order to breathe.   Complete by: As directed    Call MD for:  extreme fatigue   Complete by: As directed    Call MD for:  persistant dizziness or light-headedness   Complete by: As directed    Diet - low sodium heart healthy   Complete by: As directed    Heart Failure patients record your daily weight using the same scale at the same time of day   Complete by: As directed    Increase activity slowly   Complete by: As directed        Follow-up [place]. Go to.   Specialty: Cardiology Why: 11/30 at [facility] information: [address], [address] [id number] [place] [address] [place] Follow up.   Specialty: Cardiology Why: 11/9 at 10:30 AM   Hospital follow-up in the [facility] at [facility], [place] (Dr. Claris Gladden office) Contact information: [address] [id number] [place] [phone]                Duration of Discharge Encounter: Greater than 30 minutes including physician time.  Signed, Mamie Levers, NP  12/06/2021 9:35 AM  EP Attending  Agree with above. See my rounding note. [place] for DC with followup as scheduled.  Carleene Overlie Lateefa Crosby,MD

## 2021-12-05 NOTE — Interval H&P Note (Signed)
History and Physical Interval Note:  12/05/2021 9:53 AM  Philip Chavez  has presented today for surgery, with the diagnosis of heart failure.  The various methods of treatment have been discussed with the patient and family. After consideration of risks, benefits and other options for treatment, the patient has consented to  Procedure(s): RIGHT HEART CATH (N/A) as a surgical intervention.  The patient's history has been reviewed, patient examined, no change in status, stable for surgery.  I have reviewed the patient's chart and labs.  Questions were answered to the patient's satisfaction.     Libi Corso [employer]

## 2021-12-06 ENCOUNTER — Other Ambulatory Visit (HOSPITAL_COMMUNITY): Payer: Self-pay

## 2021-12-06 DIAGNOSIS — I5043 Acute on chronic combined systolic (congestive) and diastolic (congestive) heart failure: Secondary | ICD-10-CM | POA: Diagnosis not present

## 2021-12-06 DIAGNOSIS — I4901 Ventricular fibrillation: Secondary | ICD-10-CM | POA: Diagnosis not present

## 2021-12-06 LAB — POCT I-STAT EG7
Acid-base deficit: 1 mmol/L (ref 0.0–2.0)
Acid-base deficit: 2 mmol/L (ref 0.0–2.0)
Bicarbonate: 21.7 mmol/L (ref 20.0–28.0)
Bicarbonate: 22 mmol/L (ref 20.0–28.0)
Calcium, Ion: 1.18 mmol/L (ref 1.15–1.40)
Calcium, Ion: 1.19 mmol/L (ref 1.15–1.40)
HCT: 43 % (ref 39.0–52.0)
HCT: 43 % (ref 39.0–52.0)
Hemoglobin: 14.6 g/dL (ref 13.0–17.0)
Hemoglobin: 14.6 g/dL (ref 13.0–17.0)
O2 Saturation: 50 %
O2 Saturation: 50 %
Potassium: 4.8 mmol/L (ref 3.5–5.1)
Potassium: 4.9 mmol/L (ref 3.5–5.1)
Sodium: 136 mmol/L (ref 135–145)
Sodium: 137 mmol/L (ref 135–145)
TCO2: 23 mmol/L (ref 22–32)
TCO2: 23 mmol/L (ref 22–32)
pCO2, Ven: 32.4 mmHg — ABNORMAL LOW (ref 44–60)
pCO2, Ven: 32.6 mmHg — ABNORMAL LOW (ref 44–60)
pH, Ven: 7.433 — ABNORMAL HIGH (ref 7.25–7.43)
pH, Ven: 7.437 — ABNORMAL HIGH (ref 7.25–7.43)
pO2, Ven: 26 mmHg — CL (ref 32–45)
pO2, Ven: 26 mmHg — CL (ref 32–45)

## 2021-12-06 LAB — BASIC METABOLIC PANEL
Anion gap: 12 (ref 5–15)
BUN: 27 mg/dL — ABNORMAL HIGH (ref 8–23)
CO2: 25 mmol/L (ref 22–32)
Calcium: 8.5 mg/dL — ABNORMAL LOW (ref 8.9–10.3)
Chloride: 100 mmol/L (ref 98–111)
Creatinine, Ser: 1.66 mg/dL — ABNORMAL HIGH (ref 0.61–1.24)
GFR, Estimated: 44 mL/min — ABNORMAL LOW (ref >60)
Glucose, Bld: 116 mg/dL — ABNORMAL HIGH (ref 70–99)
Potassium: 4 mmol/L (ref 3.5–5.1)
Sodium: 137 mmol/L (ref 135–145)

## 2021-12-06 LAB — MAGNESIUM: Magnesium: 2.2 mg/dL (ref 1.7–2.4)

## 2021-12-06 MED ORDER — ATORVASTATIN CALCIUM 20 MG PO TABS
20.0000 mg | ORAL_TABLET | Freq: Every day | ORAL | 3 refills | Status: DC
  Filled 2021-12-06 (×2): qty 30, 30d supply, fill #0
  Filled 2022-01-04: qty 30, 30d supply, fill #1
  Filled 2022-02-04: qty 30, 30d supply, fill #2
  Filled 2022-03-09: qty 30, 30d supply, fill #3

## 2021-12-06 MED ORDER — AMIODARONE HCL 200 MG PO TABS
400.0000 mg | ORAL_TABLET | Freq: Two times a day (BID) | ORAL | 5 refills | Status: DC
  Filled 2021-12-06 (×2): qty 120, 53d supply, fill #0

## 2021-12-06 MED ORDER — AMIODARONE HCL 200 MG PO TABS
ORAL_TABLET | ORAL | 0 refills | Status: DC
  Filled 2021-12-06: qty 120, 53d supply, fill #0

## 2021-12-06 MED ORDER — TORSEMIDE 20 MG PO TABS: 20.0000 mg | ORAL_TABLET | Freq: Every day | ORAL | Status: DC

## 2021-12-06 MED ORDER — TORSEMIDE 20 MG PO TABS
20.0000 mg | ORAL_TABLET | Freq: Every day | ORAL | 6 refills | Status: DC
  Filled 2021-12-06 (×2): qty 30, 30d supply, fill #0
  Filled 2022-01-02: qty 30, 30d supply, fill #1
  Filled 2022-02-01: qty 30, 30d supply, fill #2
  Filled 2022-03-06: qty 30, 30d supply, fill #3

## 2021-12-06 MED ORDER — DIGOXIN 125 MCG PO TABS
0.0625 mg | ORAL_TABLET | Freq: Every day | ORAL | 6 refills | Status: DC
  Filled 2021-12-06 (×2): qty 15, 30d supply, fill #0
  Filled 2022-01-02: qty 15, 30d supply, fill #1
  Filled 2022-02-05: qty 15, 30d supply, fill #2
  Filled 2022-03-07: qty 15, 30d supply, fill #3
  Filled 2022-04-05 – 2022-04-06 (×2): qty 15, 30d supply, fill #4
  Filled 2022-05-11 (×2): qty 15, 30d supply, fill #5
  Filled 2022-06-07: qty 15, 30d supply, fill #6

## 2021-12-06 MED ORDER — BISOPROLOL FUMARATE 5 MG PO TABS
2.5000 mg | ORAL_TABLET | Freq: Every day | ORAL | 6 refills | Status: DC
  Filled 2021-12-06 (×2): qty 30, 60d supply, fill #0

## 2021-12-06 NOTE — Progress Notes (Addendum)
Advanced Heart Failure Rounding Note  PCP-Cardiologist: None   Subjective:   Admitted with VT Storm.   Off Tikosyn. Loading w/ amiodarone, now on PO. Brief run of NSVT overnight, 5 beats.   Echo 10/30: EF 15%, severely dilated LV, RV mildly reduced, mild MR, mild AI  RHC 11/1 showed low output, CI 1.5-1.6.  PCWP 22 >>Given IV Lasix   I/Os incomplete. Several unmeasured urinary occurences. Wt down 4 lb. Feels much better. Laying flat in bed. No orthopnea/PND.   Scr down slightly, 1.73>>1.66. K 4.0, Mg 2.2      RHC Procedural Findings: Hemodynamics (mmHg) RA 10 RV 49/11 PA 47/28, mean 36 PCWP 22  Oxygen saturations: PA 50% AO 94%  Cardiac Output (Fick) 3.18  Cardiac Index (Fick) 1.51  Cardiac Output (Thermo) 3.37 Cardiac Index (Thermo) 1.6 PVR 4.15 WU     Objective:   Weight Range: 84.6 kg Body mass index is 24.61 kg/m.   Vital Signs:   Temp:  [97.6 F (36.4 C)-98.2 F (36.8 C)] 97.8 F (36.6 C) (11/02 0358) Pulse Rate:  [64-87] 67 (11/02 0358) Resp:  [18] 18 (11/01 1503) BP: (96-102)/(54-73) 96/54 (11/02 0358) SpO2:  [93 %-98 %] 97 % (11/02 0358) Weight:  [84.6 kg] 84.6 kg (11/02 0358) Last BM Date : 12/04/21  Weight change: Filed Weights   12/04/21 1751 12/05/21 0341 12/06/21 0358  Weight: 86.5 kg 86.4 kg 84.6 kg    Intake/Output:   Intake/Output Summary (Last 24 hours) at 12/06/2021 0715 Last data filed at 12/06/2021 0600 Gross per 24 hour  Intake 857.39 ml  Output 375 ml  Net 482.39 ml      Physical Exam   General: well appearing male, Laying flat in bed.  No resp difficulty HEENT: normal Neck: supple. JVD ~8 cm. Carotids 2+ bilat; no bruits. No lymphadenopathy or thryomegaly appreciated. Cor: PMI nondisplaced. Regular rate & rhythm. No rubs, gallops or murmurs. Lungs: clear Abdomen: soft, nontender, nondistended. No hepatosplenomegaly. No bruits or masses. Good bowel sounds. Extremities: no cyanosis, clubbing, rash,  edema Neuro: alert & orientedx3, cranial nerves grossly intact. moves all 4 extremities w/o difficulty. Affect pleasant  Telemetry   V Paced 60s, 5 beat run NSVT yesterday evening   Labs    CBC Recent Labs    12/04/21 0357  WBC 10.3  HGB 14.7  HCT 45.1  MCV 93.8  PLT 938   Basic Metabolic Panel Recent Labs    12/05/21 0557 12/06/21 0214  NA 136 137  K 4.4 4.0  CL 100 100  CO2 22 25  GLUCOSE 129* 116*  BUN 25* 27*  CREATININE 1.73* 1.66*  CALCIUM 9.2 8.5*  MG 2.2 2.2   Liver Function Tests No results for input(s): "AST", "ALT", "ALKPHOS", "BILITOT", "PROT", "ALBUMIN" in the last 72 hours.  No results for input(s): "LIPASE", "AMYLASE" in the last 72 hours. Cardiac Enzymes No results for input(s): "CKTOTAL", "CKMB", "CKMBINDEX", "TROPONINI" in the last 72 hours.  BNP: BNP (last 3 results) No results for input(s): "BNP" in the last 8760 hours.  ProBNP (last 3 results) No results for input(s): "PROBNP" in the last 8760 hours.   D-Dimer No results for input(s): "DDIMER" in the last 72 hours. Hemoglobin A1C No results for input(s): "HGBA1C" in the last 72 hours. Fasting Lipid Panel No results for input(s): "CHOL", "HDL", "LDLCALC", "TRIG", "CHOLHDL", "LDLDIRECT" in the last 72 hours. Thyroid Function Tests No results for input(s): "TSH", "T4TOTAL", "T3FREE", "THYROIDAB" in the last 72 hours.  Invalid  input(s): "FREET3"  Other results:   Imaging    CARDIAC CATHETERIZATION  Result Date: 12/05/2021 1. Elevated filling pressures. 2. Mild mixed pulmonary venous/pulmonary arterial HTN 3. Low cardiac output by Fick and thermodilution     Medications:     Scheduled Medications:  amiodarone  400 mg Oral BID   apixaban  5 mg Oral BID   atorvastatin  20 mg Oral Daily   bisoprolol  2.5 mg Oral Daily   dapagliflozin propanediol  10 mg Oral Daily   digoxin  0.0625 mg Oral Daily   potassium chloride  10 mEq Oral BID   sacubitril-valsartan  1 tablet Oral BID    sodium chloride flush  3 mL Intravenous Q12H   sodium chloride flush  3 mL Intravenous Q12H   spironolactone  25 mg Oral Daily    Infusions:  sodium chloride     sodium chloride      PRN Medications: sodium chloride, sodium chloride, acetaminophen, ALPRAZolam, melatonin, ondansetron (ZOFRAN) IV, sodium chloride flush, sodium chloride flush, zolpidem    Patient Profile   71 y/o male w/ 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   Of note, he had a colonoscopy last week and had significant fluid loss with the prep. Mg 1.6 on admission, K 3.8. QT prolonged.   Assessment/Plan   1. VT Storm/ ICD Shock  - 14 shocks.  - in setting of chronic systolic HF + Tikosyn use for AFib + hypomagnesemia  - not all strips are included with the report but it looks like he received 14 total ICD shocks in VF zone  - QT prolonged, 562 ms, Mg low 1.6 on admit, suspect related to recent bowel prep for colonoscopy  - Tikosyn stopped. Loading w/ amio, completed IV now on PO 400 mg bid.  - brief 5 beat NSVT overnight  - K/Mag stable today.  Keep Mg > 2.0 and K > 4.0  - NCDMV- discussed no driving 6 months after shock.  2. Acute on Chronic Systolic Heart Failure - NICM, EF 15% on echo in 8/16.  Patient had borderline reduced EF when PPM was placed in 2/15.  After that, EF fell significantly.  He has a history of complete heart block and father had CHF and CHB with pacemaker, and sounds like sister also had CHB with pacemaker. 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  He had CRT-D upgrade.  No significant CAD on last cath.  Echo in 4/17 showed improvement in EF to 30-35%.  Echo in 5/18 showed EF down to 20%.  Echo in 12/21 shows EF < 20% and moderately dilated. Echo this admit with EF 15%. His LV lead became dislodged and he is no longer BiV pacing, now RV pacing > 99% of the  time.  Given minimal symptoms, decision was made not to revise his LV lead. He was not markedly fluid overloaded initially at time of this admit. Echo this admit EF 15%, severely dilated LV, RV mildly reduced, mild MR, mild AI. [place] 11/1 showed low output, CI 1.5-1.6.  PCWP 22 >>Given IV Lasix w/ symptomatic improvement. Sill mildly fluid overloaded, renal function, K/Mg stable. - Switch back to PO torsemide, 20 mg daily  - With low output HF, discussed attempt to upgrade to CRT with Dr. Lovena Le, will arrange as outpatient.   - He describes minimal symptoms despite abnormal RHC, suspect  he has learned to compensate for low output.  I worry that he could be approaching need for LVAD.  He will need CPX and close followup.  - As outpatient, would discuss again gene testing for LMNA cardiomyopathy etc given CHB, CHF, and family history. He refused in the past.  - continue Farxiga 10 mg daily  - continue Entresto 24-26 mg bid - continue spironolactone 25 mg daily  - continue digoxin 0.125 mg daily, dig level stable.  - continue bisoprolol 2.5 mg daily - Hold off on hydralazine/imdur w/ soft BP    3. PAF - Off Tikosyn as above. On amio - on Eliquis   4. Elevated Trop - Hs trop 208>>685, suspect demand ischemia in setting of VT  - no ischemic CP   5. Stage IIIa CKD - SCr baseline 1.3-1.5 - SCr 1.66 today - follow BMP w/ diuresis   6. Blood tinged sputum - No episodes over night.   Plan d/c home today. Will arrange close clinic f/u in the [facility] in 7 days and will place appt info in AVS. He has f/u w/ Dr. Lovena Le 11/30 to discuss upgrade to CRT.  Cardiac Meds for Discharge - amiodarone 400 mg bid  - Eliquis 5 mg bid  - atorvastatin 20 mg daily  - Farxiga 10 mg daily  - Entresto 24-26 mg bid - Spironolactone 25 mg daily  - Digoxin 0.0625 mg daily - bisoprolol 2.5 mg daily - torsemide 20 mg daily  - KCl 20 mEq daily    Length of Stay: 3  Brittainy Simmons, PA-C  12/06/2021, 7:15  AM  Advanced Heart Failure Team Pager [phone] (M-F; 7a - 5p)  Please contact [facility] for night-coverage after hours (5p -7a ) and weekends on [email or web address]  Patient seen with PA, agree with the above note.   He is doing well today, breathing better after diuresis yesterday.  SBP 90s-100s.   General: NAD Neck: JVP 8 cm, no thyromegaly or thyroid nodule.  Lungs: Clear to auscultation bilaterally with normal respiratory effort. CV: Nondisplaced PMI.  Heart regular S1/S2, no S3/S4, no murmur.  N Abdomen: Soft, nontender, no hepatosplenomegaly, no distention.  Skin: Intact without lesions or rashes.  Neurologic: Alert and oriented x 3.  Psych: Normal affect. Extremities: No clubbing or cyanosis.  HEENT: Normal.   Agree with home today on the above medication regimen.  He will transition from Lasix to torsemide 20 mg daily.  He will stay off hydralazine/Imdur with soft BP.  He will continue bisoprolol at home.    No further VT, will take amiodarone 400 mg bid x 1 week then 200 mg bid x 1 week then 200 mg daily.   He will need close followup.  Will need to see EP regarding replacement of CRT system. If CRT does not improve cardiac function, we will need to consider LVAD for him given low output on RHC.  He will need CPX as outpatient.

## 2021-12-06 NOTE — Progress Notes (Signed)
Pt refused to wait for his TOC meds, he was eager to get discharged and to go home via cab. Case manager aware and TOC meds rerouted to [facility] per Pt request who states that he will pick them up later in the day. Pt safely transported to main entrance by RN.

## 2021-12-06 NOTE — Progress Notes (Signed)
Pt safely discharged. Discharge packet provided with teach-back method. VS as per flow. IVs removed, Pt verbalized understanding. All questions and concerns addressed. Pt using a taxi service for transport. Refusing to use a wheelchair at this time. Will monitor his safety to taxi cab.

## 2021-12-06 NOTE — Plan of Care (Signed)
  Problem: Education: Goal: Understanding of CV disease, CV risk reduction, and recovery process will improve Outcome: Progressing   Problem: Activity: Goal: Ability to return to baseline activity level will improve Outcome: Progressing   Problem: Cardiovascular: Goal: Ability to achieve and maintain adequate cardiovascular perfusion will improve Outcome: Progressing Goal: Vascular access site(s) Level 0-1 will be maintained Outcome: Progressing   Problem: Health Behavior/Discharge Planning: Goal: Ability to safely manage health-related needs after discharge will improve Outcome: Progressing   Problem: Education: Goal: Knowledge of General Education information will improve Description: Including pain rating scale, medication(s)/side effects and non-pharmacologic comfort measures Outcome: Progressing   Problem: Health Behavior/Discharge Planning: Goal: Ability to manage health-related needs will improve Outcome: Progressing   Problem: Clinical Measurements: Goal: Ability to maintain clinical measurements within normal limits will improve Outcome: Progressing Goal: Will remain free from infection Outcome: Progressing Goal: Diagnostic test results will improve Outcome: Progressing Goal: Respiratory complications will improve Outcome: Progressing Goal: Cardiovascular complication will be avoided Outcome: Progressing   Problem: Activity: Goal: Risk for activity intolerance will decrease Outcome: Progressing   Problem: Nutrition: Goal: Adequate nutrition will be maintained Outcome: Progressing   Problem: Elimination: Goal: Will not experience complications related to bowel motility Outcome: Progressing Goal: Will not experience complications related to urinary retention Outcome: Progressing   Problem: Coping: Goal: Level of anxiety will decrease Outcome: Progressing   Problem: Pain Managment: Goal: General experience of comfort will improve Outcome:  Completed/Met   Problem: Safety: Goal: Ability to remain free from injury will improve Outcome: Completed/Met

## 2021-12-06 NOTE — Progress Notes (Signed)
Mobility Specialist - Progress Note   12/06/21 0955  Mobility  Activity Ambulated with assistance in hallway  Level of Assistance Standby assist, set-up cues, supervision of patient - no hands on  Assistive Device None  Distance Ambulated (ft) 470 ft  Activity Response Tolerated well  Mobility Referral No  $Mobility charge 1 Mobility    Pre-mobility: 86 HR During mobility:102 HR  Pt received in chair and agreeable. No complaints throughout. Pt left EOB with all needs met.   Larey Seat

## 2021-12-06 NOTE — Progress Notes (Signed)
Remote ICD transmission.   

## 2021-12-06 NOTE — Care Management Important Message (Signed)
Important Message  Patient Details  Name: Philip Chavez MRN: [id number] Date of Birth: [birth year]   Medicare Important Message Given:  Yes     Shelda Altes 12/06/2021, 10:25 AM

## 2021-12-07 ENCOUNTER — Telehealth: Payer: Self-pay

## 2021-12-07 LAB — LIPOPROTEIN A (LPA): Lipoprotein (a): 67 nmol/L — ABNORMAL HIGH (ref <75.0)

## 2021-12-07 NOTE — Telephone Encounter (Signed)
LVM for patient to call device clinic back ICD at [facility], patient has appointment with Heart and Vascular on 12/13/21 will contact industry to have alert tone turned off at that apt. Patient has scheduled apt with Dr. Lovena Le on 01/03/22 to discuss generator change out.

## 2021-12-07 NOTE — Patient Outreach (Signed)
  Care Coordination [facility] Note Transition Care Management Unsuccessful Follow-up Telephone Call  Date of discharge and from where:  12/03/21-12/06/21  Attempts:  2nd Attempt  Reason for unsuccessful TCM follow-up call:  Unable to leave message  Johnney Killian, RN, BSN, CCM Care Management Coordinator [facility] Phone: [phone]: [phone]

## 2021-12-07 NOTE — Patient Outreach (Signed)
  Care Coordination [facility] Note Transition Care Management Unsuccessful Follow-up Telephone Call  Date of discharge and from where:  [facility] 12/03/21-12/06/21  Attempts:  1st Attempt  Reason for unsuccessful TCM follow-up call:  No answer/busy  Johnney Killian, RN, BSN, CCM Care Management Coordinator [facility] Phone: [phone]: [phone]

## 2021-12-10 ENCOUNTER — Telehealth: Payer: Self-pay

## 2021-12-10 NOTE — Patient Outreach (Signed)
  Care Coordination TOC Note Transition Care Management Unsuccessful Follow-up Telephone Call  Date of discharge and from where:  [facility] 12/03/21-12/06/21  Attempts:  3rd Attempt  Reason for unsuccessful TCM follow-up call:  No answer/busy

## 2021-12-11 ENCOUNTER — Other Ambulatory Visit (HOSPITAL_COMMUNITY): Payer: Self-pay

## 2021-12-13 ENCOUNTER — Encounter (HOSPITAL_COMMUNITY): Payer: Medicare PPO

## 2021-12-20 ENCOUNTER — Ambulatory Visit (INDEPENDENT_AMBULATORY_CARE_PROVIDER_SITE_OTHER): Payer: Medicare PPO

## 2021-12-20 DIAGNOSIS — I428 Other cardiomyopathies: Secondary | ICD-10-CM

## 2021-12-20 LAB — CUP PACEART REMOTE DEVICE CHECK
Battery Remaining Longevity: 1 mo — CL
Battery Voltage: 2.66 V
Brady Statistic AP VP Percent: 25.71 %
Brady Statistic AP VS Percent: 0.14 %
Brady Statistic AS VP Percent: 73.42 %
Brady Statistic AS VS Percent: 0.73 %
Brady Statistic RA Percent Paced: 22.11 %
Brady Statistic RV Percent Paced: 98.85 %
Date Time Interrogation Session: 20231116043724
HighPow Impedance: 56 Ohm
Implantable Lead Connection Status: 753985
Implantable Lead Connection Status: 753985
Implantable Lead Connection Status: 753985
Implantable Lead Implant Date: 20150209
Implantable Lead Implant Date: 20160919
Implantable Lead Implant Date: 20160919
Implantable Lead Location: 753858
Implantable Lead Location: 753859
Implantable Lead Location: 753860
Implantable Lead Model: 4598
Implantable Lead Model: 5076
Implantable Lead Model: 6935
Implantable Pulse Generator Implant Date: 20160919
Lead Channel Impedance Value: 266 Ohm
Lead Channel Impedance Value: 266 Ohm
Lead Channel Impedance Value: 266 Ohm
Lead Channel Impedance Value: 304 Ohm
Lead Channel Impedance Value: 323 Ohm
Lead Channel Impedance Value: 323 Ohm
Lead Channel Impedance Value: 342 Ohm
Lead Channel Impedance Value: 399 Ohm
Lead Channel Impedance Value: 456 Ohm
Lead Channel Impedance Value: 456 Ohm
Lead Channel Impedance Value: 494 Ohm
Lead Channel Impedance Value: 494 Ohm
Lead Channel Impedance Value: 513 Ohm
Lead Channel Pacing Threshold Amplitude: 0.75 V
Lead Channel Pacing Threshold Amplitude: 0.875 V
Lead Channel Pacing Threshold Amplitude: 2.5 V
Lead Channel Pacing Threshold Pulse Width: 0.4 ms
Lead Channel Pacing Threshold Pulse Width: 0.4 ms
Lead Channel Pacing Threshold Pulse Width: 0.4 ms
Lead Channel Sensing Intrinsic Amplitude: 13 mV
Lead Channel Sensing Intrinsic Amplitude: 13 mV
Lead Channel Sensing Intrinsic Amplitude: 2.125 mV
Lead Channel Sensing Intrinsic Amplitude: 2.125 mV
Lead Channel Setting Pacing Amplitude: 1.5 V
Lead Channel Setting Pacing Amplitude: 2 V
Lead Channel Setting Pacing Pulse Width: 0.4 ms
Lead Channel Setting Sensing Sensitivity: 0.3 mV
Zone Setting Status: 755011

## 2021-12-24 ENCOUNTER — Other Ambulatory Visit (HOSPITAL_COMMUNITY): Payer: Self-pay | Admitting: *Deleted

## 2021-12-24 MED ORDER — BISOPROLOL FUMARATE 5 MG PO TABS: 2.5000 mg | ORAL_TABLET | Freq: Every day | ORAL | 6 refills | Status: DC

## 2021-12-31 ENCOUNTER — Ambulatory Visit (INDEPENDENT_AMBULATORY_CARE_PROVIDER_SITE_OTHER): Payer: Medicare PPO

## 2021-12-31 DIAGNOSIS — I5022 Chronic systolic (congestive) heart failure: Secondary | ICD-10-CM

## 2021-12-31 DIAGNOSIS — Z95810 Presence of automatic (implantable) cardiac defibrillator: Secondary | ICD-10-CM

## 2022-01-02 ENCOUNTER — Other Ambulatory Visit (HOSPITAL_COMMUNITY): Payer: Self-pay

## 2022-01-03 ENCOUNTER — Telehealth: Payer: Self-pay

## 2022-01-03 ENCOUNTER — Ambulatory Visit: Payer: Medicare PPO | Attending: Internal Medicine | Admitting: Internal Medicine

## 2022-01-03 VITALS — BP 92/50 | HR 61 | Ht 73.0 in | Wt 187.6 lb

## 2022-01-03 DIAGNOSIS — I442 Atrioventricular block, complete: Secondary | ICD-10-CM

## 2022-01-03 DIAGNOSIS — I4819 Other persistent atrial fibrillation: Secondary | ICD-10-CM

## 2022-01-03 DIAGNOSIS — Z95810 Presence of automatic (implantable) cardiac defibrillator: Secondary | ICD-10-CM

## 2022-01-03 NOTE — Patient Instructions (Addendum)
Medication Instructions:  Your physician recommends that you continue on your current medications as directed. Please refer to the Current Medication list given to you today.  *If you need a refill on your cardiac medications before your next appointment, please call your pharmacy*  Lab Work: None ordered.  If you have labs (blood work) drawn today and your tests are completely normal, you will receive your results only by: [place] (if you have MyChart) OR A paper copy in the mail If you have any lab test that is abnormal or we need to change your treatment, we will call you to review the results.  Testing/Procedures: None ordered.  Follow-Up:  We will contact you regarding the Medtronic BiV ICD Extraction of LV Lead on either Wednesday January 3rd, 2024 or Thursday February 07, 2022.    We will provide instructions, scrub soap, and schedule this procedure for you in the near future.

## 2022-01-03 NOTE — Telephone Encounter (Signed)
Pt agreed to send missed transmission today when he get home.

## 2022-01-03 NOTE — Progress Notes (Signed)
HPI Mr. Wilner returns today for followup. He is a pleasant 71 yo man with a h/o non-ischemic CM, CHB, s/p PPM insertion pacing induced LBBB and subsequent upgrade to a BIV ICD. He then developed ICD lead dislodgement and lost biv pacing. He has had progressively worsening CHF symptoms. He has not had syncope. He denies chest pain. His CHF symptoms are class 3. He has recently had VT/VF storm. He has not had more ICD shocks since DC on amiodarone. In addition, he is the sole provider for his wife who has dementia and is bed bound.  No Known Allergies   Current Outpatient Medications  Medication Sig Dispense Refill   acetaminophen (TYLENOL) 325 MG tablet Take 650 mg by mouth every 6 (six) hours as needed for moderate pain.     amiodarone (PACERONE) 200 MG tablet Take 2 tablets (400 mg total) by mouth 2 (two) times daily for 7 days. Then reduce to '400mg'$  (2 tablets) daily 120 tablet 5   amiodarone (PACERONE) 200 MG tablet Take 2 tablets by mouth 2 times daily for 7 days, THEN 2 tablets daily. 120 tablet 0   apixaban (ELIQUIS) 5 MG TABS tablet Take 1 tablet (5 mg total) by mouth 2 (two) times daily. 180 tablet 3   atorvastatin (LIPITOR) 20 MG tablet Take 1 tablet (20 mg total) by mouth daily. 30 tablet 3   bisoprolol (ZEBETA) 5 MG tablet Take 1/2 tablet (2.5 mg total) by mouth daily. 30 tablet 6   dapagliflozin propanediol (FARXIGA) 10 MG TABS tablet Take 1 tablet by mouth daily. Absolute last refill please call [phone] to schedule follow up 30 tablet 11   diclofenac Sodium (VOLTAREN) 1 % GEL Apply 1 Application topically 4 (four) times daily as needed (pain).     digoxin (LANOXIN) 0.125 MG tablet Take 1/2 tablet (0.0625 mg total) by mouth daily. 15 tablet 6   Multiple Vitamins-Minerals (CERTAVITE/ANTIOXIDANTS) TABS Take 1 tablet by mouth daily with breakfast.     oxymetazoline (AFRIN) 0.05 % nasal spray Place 1 spray into both nostrils 2 (two) times daily as needed for congestion.      potassium chloride (KLOR-CON) 10 MEQ tablet TAKE 1 TABLET BY MOUTH 2 TIMES DAILY. 180 tablet 2   sacubitril-valsartan (ENTRESTO) 24-26 MG Take 1 tablet by mouth 2 (two) times daily. 60 tablet 11   spironolactone (ALDACTONE) 25 MG tablet TAKE 1 TABLET BY MOUTH ONCE A DAY 90 tablet 3   torsemide (DEMADEX) 20 MG tablet Take 1 tablet (20 mg total) by mouth daily. 30 tablet 6   No current facility-administered medications for this visit.     Past Medical History:  Diagnosis Date   AICD (automatic cardioverter/defibrillator) present 0919/2016   BIV    Annual physical exam 06/09/2019   Atrial flutter ([place])    a. 08/2014 presented w/ aflutter->converted on amio;  b. CHA2DS2VASc = 2 -->Xarelto.   Atrial tachycardia    Chronic combined systolic and diastolic CHF (congestive heart failure) ([place])    a. 03/2013 Echo EF 50-55%;  b. 07/2013 Echo EF 20-25%, diff HK, Gr3 DD;  c. 08/2014 Echo: EF 15%, diff HK, mild AI/MR, mildly dil LA/RV, mod TR, PASP 33mHg.   DVT (deep venous thrombosis) ([place]    a. 2007 RLE: S/P ankle surgery;  b. 03/2013 LLE DVT & PE-->Xarelto.   Embolism, pulmonary with infarction ([place]    a. 2007 RLE: S/P ankle surgery;  b. 03/2013 LLE DVT & PE-->Xarelto.   Hypercholesterolemia  Hypercoagulable state ([place])    Hypertension    Musculoskeletal neck pain    Nonischemic cardiomyopathy ([place])    a. 07/2013 Echo: EF 20-25%;  b. 07/2013 Myoview: Large inferior, lateral, apical scar w/ HK, no ischemia, EF 17%;  b. 08/2014 Echo: EF 15%, diff HK.   Pneumonia ~ 04/2014; 09/02/2014   Presence of permanent cardiac pacemaker    a. 03/2013 s/p MDT ADDRL1 Adapta DC PPM, ser # [id number].   Sleep apnea    does not wear mask (09/02/2014)   Third degree heart block ([place])    a. 03/2013 s/p MDT ADDRL1 Adapta DC PPM, ser # [id number].    ROS:   All systems reviewed and negative except as noted in the HPI.   Past Surgical History:  Procedure Laterality Date   CARDIAC CATHETERIZATION N/A 10/17/2014    Procedure: Right/Left Heart Cath and Coronary Angiography;  Surgeon: Larey Dresser, MD;  Location: [place] [facility];  Service: Cardiovascular;  Laterality: N/A;   CARDIOVERSION N/A 07/26/2020   Procedure: CARDIOVERSION;  Surgeon: Buford Dresser, MD;  Location: [facility];  Service: Cardiovascular;  Laterality: N/A;   COLONOSCOPY WITH PROPOFOL N/A 11/28/2021   Procedure: COLONOSCOPY WITH PROPOFOL;  Surgeon: Wilford Corner, MD;  Location: [facility];  Service: Gastroenterology;  Laterality: N/A;   ELECTROPHYSIOLOGIC STUDY N/A 09/07/2014   Procedure: A-Flutter;  Surgeon: Evans Lance, MD;  Location: [place] [facility];  Service: Cardiovascular;  Laterality: N/A;   EP IMPLANTABLE DEVICE  10/24/2014   BIV   EP IMPLANTABLE DEVICE N/A 10/24/2014   Procedure: BiV ICD Upgrade;  Surgeon: Evans Lance, MD;  Location: [place] [facility];  Service: Cardiovascular;  Laterality: N/A;   FOOT FRACTURE SURGERY Right 2007   FRACTURE SURGERY     INGUINAL HERNIA REPAIR Right 1980's   INGUINAL HERNIA REPAIR Right 09/22/2020   Procedure: LAPAROSCOPIC RIGHT INGUINAL HERNIA REPAIR WITH MESH;  Surgeon: Ralene Ok, MD;  Location: [place] [facility];  Service: General;  Laterality: Right;   INSERT / REPLACE / REMOVE PACEMAKER     MDT ADDRL1 pacemaker implanted by Dr Lovena Le for complete heart block   PERMANENT PACEMAKER INSERTION N/A 03/15/2013   Procedure: PERMANENT PACEMAKER INSERTION;  Surgeon: Evans Lance, MD;  [facility];  Service: Cardiovascular;  Laterality: N/A;   POLYPECTOMY  11/28/2021   Procedure: POLYPECTOMY;  Surgeon: Wilford Corner, MD;  Location: [facility];  Service: Gastroenterology;;   RIGHT HEART CATH N/A 12/05/2021   Procedure: RIGHT HEART CATH;  Surgeon: Larey Dresser, MD;  Location: [place] [facility];  Service: Cardiovascular;  Laterality: N/A;   SCLEROTHERAPY  11/28/2021   Procedure: SCLEROTHERAPY;  Surgeon: Wilford Corner, MD;  Location: [facility];  Service:  Gastroenterology;;   SUBMUCOSAL TATTOO INJECTION  11/28/2021   Procedure: SUBMUCOSAL TATTOO INJECTION;  Surgeon: Wilford Corner, MD;  [facility]: [facility];  Service: Gastroenterology;;     Family History  Problem Relation Age of Onset   Diabetes type II Mother    Heart disease Father    Arrhythmia Father    Arrhythmia Sister    Hypertension Neg Hx    Heart attack Neg Hx    Stroke Neg Hx      Social History   Socioeconomic History   Marital status: Married    Spouse name: Luellen Pucker   Number of children: 2   Years of education: Not on file   Highest education level: Not on file  Occupational History   Not on file  Tobacco Use   Smoking status: Former    Packs/day: 1.50    Years: 10.00    Total pack years: 15.00    Types: Cigarettes    Quit date: 02/04/1982    Years since quitting: 39.9    Passive exposure: Past   Smokeless tobacco: Never   Tobacco comments:    09/02/2014  "quit smoking 20-30 yr ago"  Vaping Use   Vaping Use: Never used  Substance and Sexual Activity   Alcohol use: No   Drug use: No   Sexual activity: Yes  Other Topics Concern   Not on file  Social History Narrative   Not on file   Social Determinants of Health   Financial Resource Strain: Not on file  Food Insecurity: No Food Insecurity (08/30/2021)   Hunger Vital Sign    Worried About Running Out of Food in the Last Year: Never true    Ran Out of Food in the Last Year: Never true  Transportation Needs: No Transportation Needs (08/30/2021)   PRAPARE - Hydrologist (Medical): No    Lack of Transportation (Non-Medical): No  Physical Activity: Not on file  Stress: Not on file  Social Connections: Not on file  Intimate Partner Violence: Not on file     BP (!) 92/50   Pulse 61   Ht '6\' 1"'$  (1.854 m)   Wt 187 lb 9.6 oz (85.1 kg)   SpO2 98%   BMI 24.75 kg/m   Physical Exam:  Well appearing NAD HEENT: Unremarkable Neck:  No JVD, no thyromegally Lymphatics:   No adenopathy Back:  No CVA tenderness Lungs:  Clear with minimal basilar rales. HEART:  Regular rate rhythm, no murmurs, no rubs, no clicks Abd:  soft, positive bowel sounds, no organomegally, no rebound, no guarding Ext:  2 plus pulses, no edema, no cyanosis, no clubbing Skin:  No rashes no nodules Neuro:  CN II through XII intact, motor grossly intact  EKG - nsr with ventricular pacing and QRS of 204 ms.  DEVICE  Normal device function.  See PaceArt for details.   Assess/Plan: Chronic systolic heart failure - I have recommend that we plan to place a new LV lead. I have discussed the indications/risks/benefits/goals/expectations and he wishes to proceed. VT - he will continue amiodarone. I hope to wean down if we can get him resychronized. PAF - he is maintaining NSR. His dofetilide was stopped.  Carleene Overlie Suellyn Meenan,MD

## 2022-01-04 ENCOUNTER — Other Ambulatory Visit (HOSPITAL_COMMUNITY): Payer: Self-pay

## 2022-01-04 NOTE — Progress Notes (Signed)
EPIC Encounter for ICM Monitoring  Patient Name: Philip Chavez is a 71 y.o. male Date: 01/04/2022 Primary Care Physican: Ezequiel Essex, MD Primary Cardiologist: Aundra Dubin Electrophysiologist: Lovena Le RV Pacing:  98.6%        07/26/2021 Weight: 200 lbs 09/05/2021 Weight: 200 lbs 10/12/2021 Weight: 200 ;bs   Since 20-Dec-2021 AT/AF 1 Time in AT/AF 0.9 hr/day (3.8%) Longest AT/AF 13 hours           Transmission reviewed.  Pt seen in office by Dr Lovena Le 11/30.  Battery replacement will be scheduled for next month.   OptiVol Thoracic impedance suggesting normal fluid levels.     Prescribed:  Torsemide 20 mg 1 tablet by mouth once a day. Potassium 10 mEq take 1 tablet by mouth twice a day Spironolactone 25 mg take 1 tablet by mouth once a day   Labs: 12/06/2021 Creatinine 1.66, BUN 27, Potassium 4.0, Sodium 137, GFR 44 12/05/2021 Creatinine 1.73, BUN 25, Potassium 4.4, Sodium 136, GFR 42  12/04/2021 Creatinine 1.42, BUN 19, Potassium 4.4, Sodium 137, GFR 42  12/03/2021 Creatinine 1.46, BUN 15, Potassium 3.8, Sodium 137, GFR 51  A complete set of results can be found in Results Review.   Recommendations:  Given at 01/03/2022 office visit.    Follow-up plan: ICM clinic phone appointment on 02/11/2022.   91 day device clinic remote transmission 02/21/2022.     EP/Cardiology Office Visits:  01/14/2022 with HF clinic.      Copy of ICM check sent to Dr. Lovena Le.  3 month ICM trend: 01/03/2022.    12-14 Month ICM trend:     Rosalene Billings, RN 01/04/2022 6:48 AM

## 2022-01-08 ENCOUNTER — Telehealth: Payer: Self-pay

## 2022-01-08 DIAGNOSIS — I5022 Chronic systolic (congestive) heart failure: Secondary | ICD-10-CM

## 2022-01-08 NOTE — Telephone Encounter (Signed)
Work up complete for procedure.   Pt has been scheduled for procedure on 02/11/22 @ 3pm. Dr. Lavonna Monarch will be back up surgeon.  He will come in for labs on 12/18 & get scrub soap that day.  He was advised to hold Eliquis x 2 days.  I am mailing his instructions to him.

## 2022-01-09 ENCOUNTER — Other Ambulatory Visit (HOSPITAL_COMMUNITY): Payer: Self-pay | Admitting: *Deleted

## 2022-01-09 DIAGNOSIS — I5022 Chronic systolic (congestive) heart failure: Secondary | ICD-10-CM

## 2022-01-09 NOTE — Progress Notes (Signed)
Remote ICD transmission.   

## 2022-01-11 ENCOUNTER — Other Ambulatory Visit (HOSPITAL_COMMUNITY): Payer: Self-pay

## 2022-01-12 ENCOUNTER — Other Ambulatory Visit (HOSPITAL_COMMUNITY): Payer: Self-pay

## 2022-01-14 ENCOUNTER — Encounter (HOSPITAL_COMMUNITY): Payer: Medicare PPO

## 2022-01-14 NOTE — Progress Notes (Incomplete)
Patient ID: Philip Chavez, male   DOB: December 09, 1950, 71 y.o.   MRN: [id number] PCP: Ezequiel Essex, MD EP: Dr. Lovena Le HF cardiologist: Dr Aundra Dubin  71 y.o. with history of complete heart block/Medtronic PPM, cardiomyopathy, and atrial flutter s/p ablation presents for CHF clinic evaluation.  Patient developed complete heart block in 2/15 and had PPM placed at that time.  He paces his RV continuously.  In 2/15, EF was 50-55% by echo.  By 6/15, EF had fallen to 20-25%.  Cardiolite showed possible scar but no ischemia.  On 09/03/14, he was admitted with dyspnea and found to be in atrial flutter with RLL PNA and with volume overload.  Echo showed EF 15% with diffuse hypokinesis.  He was started on IV Lasix and IV amiodarone. He converted back to NSR.  He subsequently had atrial flutter ablation.  He had right and left heart cath in 9/16, showing nonobstructive CAD and optimized filling pressures with CI 2.09.  He had upgrade to CRT by Dr Lovena Le.  EF up to 30-35% in 4/17.  Subsequently, the LV lead dislodged.  He was offered lead revision, but preferred watchful waiting. Echo in 5/18 showed EF down to 20%.    Echo in 10/20 showed EF < 20%, normal RV.  Echo 12/21 showed EF <20%, moderate LV dilation with mild LVH, mildly decreased RV systolic function.   Patient was started on Tikosyn for atrial fibrillation and converted to NSR.    Echo (7/23) showed EF 15-20%, RV mildly reduced, mild to moderate MR.  Passed out 10/26/21, device showed sustained VT with successful shock. Follow up labs OK, ECG showed QTc 583 msec.   Follow up 10/23, NYHA I-II, euvolemic.Continues to RV pace, LV lead has not been revised given minimal symptoms.   Admitted 11/23 with VT storm and ICD shocks after prep for colonoscopy.  ECG (personally reviewed from 10/30/21): NSR, V-paced, QTc 583 msec  MDT device interrogation (personally reviewed): 99% RV paced, stable thoracic impedance, 2 hr/day activity, VT/VF noted 10/25/21, short bursts  of AF.    Labs (8/16): K 3.9, creatinine 1.03, AST 106, ALT 173, HCT 45.1, LDL 132 Labs (9/16): K 3.9, creatinine 1.16, HCT 42.5 Labs (10/16): K 3.9, creatinine 1.32, HCT 39.9 Labs (2/17): K 4.1, creatinine 1.26, BNP 107, digoxin 0.3 Labs (07/10/2015): K 3.9 Creatinine 1.23  Labs (1/18): K 4.1, creatinine 1.18, digoxin 0.3 Labs (2/18): K 4, creatinine 1.24, digoxin 0.4 Labs (5/18): K 4, creatinine 1.25, hgb 14.8, digoxin 0.5 Labs (10/18): K 4, creatinine 1.24, digoxin 0.2 Labs (4/19): K 3.8, creatinine 1.22, digoxin 0.5, LDL 69, HDL 40 Labs (8/19): K 3.9, creatinine 1.35, digoxin 0.3 Labs (12/19): K 4, creatinine 1.22 Labs (1/21): K 4.2, creatinine 1.34, LDL 121 Labs (4/21): digoxin level 0.7, K 3.8, creatinine 1.28 Labs (9/21): digoxin 0.2, hgb 15, K 3.8, creatinine 1.22 Labs (12/21): digoxin 0.5 Labs (1/22): K 3.8, creatinine 1.33 Labs (3/23): K 4.3, creatinine 1.39 Labs (9/23): K 4.2, creatinine 1.37  PMH: 1. H/o complete heart block: Has Medtronic PPM, placed in 2/15.   2. Cardiomyopathy: Echo (2/15) prior to PPM placement with EF 50-55%.  Echo (6/15) with EF 20-25%,  Cardiolite at that time showed possible scar but no ischemia.  Echo (8/16) with EF 15%, diffuse hypokinesis, mildly dilated RV with normal systolic function, PA systolic pressure 69 mmHg.  LHC/RHC (9/16) with mean RA 4, PA 31/15 mean 20, mean PCWP 8, CI 2.09, 40% mLAD stenosis.  Upgrade to MDT CRT-D in 9/16.  -  Echo (4/17) with EF 30-35%.   - LV lead dislodged and loss of BiV pacing.  - Echo (5/18) with EF 20%, mild LV dilation, normal RV size with mildly decreased systolic function, mild AI.  - Echo (10/20): EF < 20%, RV normal, mild MR.  - Echo (12/21): EF <20%, moderate LV dilation with mild LVH, mildly decreased RV systolic function. - Echo (7/23): EF 15-20%, mildly decreased RV, mild to moderate MR 3. CKD 4. 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  5. Atrial tachycardia: paroxysmal. 6. Atrial flutter: s/p  ablation in 8/16.  7. OSA 8. Hyperlipidemia 9. Shingles with post-herpetic neuralgia.  10. Atrial fibrillation: Paroxysmal.   FH: Father with CHF, complete heart block with PPM.  Sister with complete heart block and PPM.   SH: Retired Curator, nonsmoker, lives in [place]  ROS: All systems reviewed and negative except as per HPI  Current Outpatient Medications  Medication Sig Dispense Refill   acetaminophen (TYLENOL) 325 MG tablet Take 650 mg by mouth every 6 (six) hours as needed for moderate pain.     amiodarone (PACERONE) 200 MG tablet Take 2 tablets (400 mg total) by mouth 2 (two) times daily for 7 days. Then reduce to '400mg'$  (2 tablets) daily 120 tablet 5   amiodarone (PACERONE) 200 MG tablet Take 2 tablets by mouth 2 times daily for 7 days, THEN 2 tablets daily. 120 tablet 0   apixaban (ELIQUIS) 5 MG TABS tablet Take 1 tablet (5 mg total) by mouth 2 (two) times daily. 180 tablet 3   atorvastatin (LIPITOR) 20 MG tablet Take 1 tablet (20 mg total) by mouth daily. 30 tablet 3   bisoprolol (ZEBETA) 5 MG tablet Take 1/2 tablet (2.5 mg total) by mouth daily. 30 tablet 6   dapagliflozin propanediol (FARXIGA) 10 MG TABS tablet Take 1 tablet by mouth daily. Absolute last refill please call [phone] to schedule follow up 30 tablet 11   diclofenac Sodium (VOLTAREN) 1 % GEL Apply 1 Application topically 4 (four) times daily as needed (pain).     digoxin (LANOXIN) 0.125 MG tablet Take 1/2 tablet (0.0625 mg total) by mouth daily. 15 tablet 6   Multiple Vitamins-Minerals (CERTAVITE/ANTIOXIDANTS) TABS Take 1 tablet by mouth daily with breakfast.     oxymetazoline (AFRIN) 0.05 % nasal spray Place 1 spray into both nostrils 2 (two) times daily as needed for congestion.     potassium chloride (KLOR-CON) 10 MEQ tablet TAKE 1 TABLET BY MOUTH 2 TIMES DAILY. 180 tablet 2   sacubitril-valsartan (ENTRESTO) 24-26 MG Take 1 tablet by mouth 2 (two) times daily. 60 tablet 11   spironolactone (ALDACTONE) 25  MG tablet TAKE 1 TABLET BY MOUTH ONCE A DAY 90 tablet 3   torsemide (DEMADEX) 20 MG tablet Take 1 tablet (20 mg total) by mouth daily. 30 tablet 6   No current facility-administered medications for this visit.   Wt Readings from Last 3 Encounters:  01/03/22 85.1 kg (187 lb 9.6 oz)  12/06/21 84.6 kg (186 lb 8 oz)  11/28/21 88.5 kg (195 lb)   There were no vitals taken for this visit. Physical Exam General:  NAD. No resp difficulty, walked into clinic. HEENT: Normal Neck: Supple. No JVD. Carotids 2+ bilat; no bruits. No lymphadenopathy or thryomegaly appreciated. Cor: PMI nondisplaced. Regular rate & rhythm. No rubs, gallops or murmurs. Lungs: Clear Abdomen: Soft, nontender, nondistended. No hepatosplenomegaly. No bruits or masses. Good bowel sounds. Extremities: No cyanosis, clubbing, rash, edema Neuro: Alert & oriented  x 3, cranial nerves grossly intact. Moves all 4 extremities w/o difficulty. Affect pleasant.  Assessment/Plan: 1. Chronic systolic CHF: Nonischemic cardiomyopathy.  EF 15% on echo in 8/16.  Patient had borderline reduced EF when PPM was placed in 2/15.  After that, EF fell significantly.  He has a history of complete heart block and father had CHF and CHB with pacemaker, and sounds like sister also had CHB with pacemaker. 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  He had CRT-D upgrade.  No significant CAD on last cath.  Echo in 4/17 showed improvement in EF to 30-35%.  However, his LV lead became dislodged and he is no longer BiV pacing, now RV pacing > 99% of the time.  Echo in 5/18 showed EF down to 20%.  Echo in 12/21 shows EF < 20% and moderately dilated. Repeat echo 7/23 showed EF remains 15-20%. Symptomatically, he is still doing well (NYHA class I-II) with no volume overload on exam or by Optivol. He feels better now that he is back in NSR on Tikosyn.  However, I am concerned about his long-term prognosis as his LV is very  dysfunctional and dilated.  Given minimal symptoms, decision was made not to revise his LV lead.  - He is off bisoprolol, unclear as to why.  He will need to restart this in the future.  Will not start today with soft BP.  - Continue Entresto 24/26 bid.  - Continue spironolactone 25 mg daily. BMET today. - Continue Lasix 20 mg bid.  - Continue hydralazine 12.5 mg tid + Imdur 30 daily.    - Continue digoxin. He took med today. Check as a trough in 1-2 weeks.  - Continue Farxiga 10 mg daily.  - We have discussed genetic evaluation to look for LMNA or SCN5A mutations that could be used for familial screening, he has wanted to hold off.  2. Atrial flutter: s/p ablation.  3. Atrial fibrillation: He is in NSR on Tikosyn. He has done well with Tikosyn.  His paced QTc is prolonged. Dr. Aundra Dubin previously discussed this with Dr. Lovena Le, and given the presence of an ICD, decided to keep his Tikosyn at the current dose. Needs follow up with Dr. Lovena Le. - Continue Eliquis.  - Continue current Tikosyn. Check BMET and Mag today. 3. PE/DVT: remote and associated with surgery.   4. Hyperlipidemia: He has mild CAD.   - No ASA with Eliquis.  No bleeding issues. - Continue simvastatin 40 mg daily.   5. Complete heart block: Now RV pacing with loss of LV lead, see discussion above.   Followup in 3-4 months with Dr. Aundra Dubin.  Maricela Bo [facility] FNP-BC 01/14/2022

## 2022-01-17 ENCOUNTER — Ambulatory Visit (HOSPITAL_COMMUNITY)
Admission: RE | Admit: 2022-01-17 | Discharge: 2022-01-17 | Disposition: A | Discharge status: Home / Self Care | Payer: Medicare PPO | Source: Ambulatory Visit | Attending: Physician Assistant | Admitting: Physician Assistant

## 2022-01-17 VITALS — BP 110/70 | HR 79 | Wt 193.0 lb

## 2022-01-17 DIAGNOSIS — E785 Hyperlipidemia, unspecified: Secondary | ICD-10-CM | POA: Diagnosis not present

## 2022-01-17 DIAGNOSIS — N1831 Chronic kidney disease, stage 3a: Secondary | ICD-10-CM | POA: Diagnosis not present

## 2022-01-17 DIAGNOSIS — I480 Paroxysmal atrial fibrillation: Secondary | ICD-10-CM | POA: Diagnosis not present

## 2022-01-17 DIAGNOSIS — Z8249 Family history of ischemic heart disease and other diseases of the circulatory system: Secondary | ICD-10-CM | POA: Diagnosis not present

## 2022-01-17 DIAGNOSIS — I472 Ventricular tachycardia, unspecified: Secondary | ICD-10-CM | POA: Diagnosis not present

## 2022-01-17 DIAGNOSIS — I442 Atrioventricular block, complete: Secondary | ICD-10-CM | POA: Insufficient documentation

## 2022-01-17 DIAGNOSIS — I2510 Atherosclerotic heart disease of native coronary artery without angina pectoris: Secondary | ICD-10-CM | POA: Diagnosis not present

## 2022-01-17 DIAGNOSIS — Z79899 Other long term (current) drug therapy: Secondary | ICD-10-CM | POA: Diagnosis not present

## 2022-01-17 DIAGNOSIS — I428 Other cardiomyopathies: Secondary | ICD-10-CM | POA: Diagnosis not present

## 2022-01-17 DIAGNOSIS — I5022 Chronic systolic (congestive) heart failure: Secondary | ICD-10-CM | POA: Diagnosis not present

## 2022-01-17 DIAGNOSIS — Z7901 Long term (current) use of anticoagulants: Secondary | ICD-10-CM | POA: Insufficient documentation

## 2022-01-17 DIAGNOSIS — I4892 Unspecified atrial flutter: Secondary | ICD-10-CM | POA: Diagnosis not present

## 2022-01-17 LAB — BASIC METABOLIC PANEL
Anion gap: 7 (ref 5–15)
BUN: 15 mg/dL (ref 8–23)
CO2: 28 mmol/L (ref 22–32)
Calcium: 9 mg/dL (ref 8.9–10.3)
Chloride: 104 mmol/L (ref 98–111)
Creatinine, Ser: 1.27 mg/dL — ABNORMAL HIGH (ref 0.61–1.24)
GFR, Estimated: >60 mL/min (ref >60)
Glucose, Bld: 91 mg/dL (ref 70–99)
Potassium: 3.6 mmol/L (ref 3.5–5.1)
Sodium: 139 mmol/L (ref 135–145)

## 2022-01-17 LAB — BRAIN NATRIURETIC PEPTIDE: B Natriuretic Peptide: 524.6 pg/mL — ABNORMAL HIGH (ref 0.0–100.0)

## 2022-01-17 NOTE — Patient Instructions (Signed)
Please take an additional tablet of Torsemide today  Labs today We will only contact you if something comes back abnormal or we need to make some changes. Otherwise no news is good news!  Genetic test has been done, this has to be sent to [place] to be processed and can take 1-2 weeks to get results back.  We will let you know the results.   Your physician recommends that you schedule a follow-up appointment in: 2 months with Dr Aundra Dubin  Do the following things EVERYDAY: Weigh yourself in the morning before breakfast. Write it down and keep it in a log. Take your medicines as prescribed Eat low salt foods--Limit salt (sodium) to 2000 mg per day.  Stay as active as you can everyday Limit all fluids for the day to less than 2 liters  At the [facility], you and your health needs are our priority. As part of our continuing mission to provide you with exceptional heart care, we have created designated Provider Care Teams. These Care Teams include your primary Cardiologist (physician) and Advanced Practice Providers (APPs- Physician Assistants and Nurse Practitioners) who all work together to provide you with the care you need, when you need it.   You may see any of the following providers on your designated Care Team at your next follow up: Dr Glori Bickers Dr Loralie Champagne Dr. Roxana Hires, NP Lyda Jester, [place] [facility] [place], [place] Forestine Na, NP Audry Riles, PharmD   Please be sure to bring in all your medications bottles to every appointment.   If you have any questions or concerns before your next appointment please send [place] a message through [place] or call our office at [phone].    TO LEAVE A MESSAGE FOR THE NURSE SELECT OPTION 2, PLEASE LEAVE A MESSAGE INCLUDING: YOUR NAME DATE OF BIRTH CALL BACK NUMBER REASON FOR CALL**this is important as we prioritize the call backs  YOU WILL RECEIVE A CALL BACK THE SAME DAY  AS LONG AS YOU CALL BEFORE 4:00 PM

## 2022-01-17 NOTE — Progress Notes (Signed)
Patient ID: Philip Chavez, male   DOB: [birth year], 71 y.o.   MRN: [id number] PCP: Ezequiel Essex, MD EP: Dr. Lovena Le HF cardiologist: Dr Aundra Dubin  71 y.o. with history of complete heart block/Medtronic PPM, cardiomyopathy, and atrial flutter s/p ablation presents for CHF clinic evaluation.  Patient developed complete heart block in 2/15 and had PPM placed at that time.  He paces his RV continuously.  In 2/15, EF was 50-55% by echo.  By 6/15, EF had fallen to 20-25%.  Cardiolite showed possible scar but no ischemia.  On 09/03/14, he was admitted with dyspnea and found to be in atrial flutter with RLL PNA and with volume overload.  Echo showed EF 15% with diffuse hypokinesis.  He was started on IV Lasix and IV amiodarone. He converted back to NSR.  He subsequently had atrial flutter ablation.  He had right and left heart cath in 9/16, showing nonobstructive CAD and optimized filling pressures with CI 2.09.  He had upgrade to CRT by Dr Lovena Le.  EF up to 30-35% in 4/17.  Subsequently, the LV lead dislodged.  He was offered lead revision, but preferred watchful waiting. Echo in 5/18 showed EF down to 20%.    Echo in 10/20 showed EF < 20%, normal RV.  Echo 12/21 showed EF <20%, moderate LV dilation with mild LVH, mildly decreased RV systolic function.   Patient was started on Tikosyn for atrial fibrillation and converted to NSR.    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 Tikosyn was stopped and he was loaded with amiodarone. RHC demonstrated elevated filling pressures and reduced CO. He was diuresed. Saw Dr. Lovena Le for f/u 11/30. He is planning to undergo ICD generator change and placement of new LV lead in January.  Here today for f/u. Has been doing well from HF perspective. No chest pain, shortness, of breath, orthopnea, PND or LE edema. Home weight has been stable in low 90s. Taking all medications as prescribed. Watching fluid and sodium intake.   ECG (personally reviewed): V paced 60  MDT device: OptiVol  gradually trending up, thoracic impedance at threshold a few days ago and starting to trend down, last episode of AF 12/08 (lasted ~ 12/hrs), no recurrent shocks.  Labs (8/16): K 3.9, creatinine 1.03, AST 106, ALT 173, HCT 45.1, LDL 132 Labs (9/16): K 3.9, creatinine 1.16, HCT 42.5 Labs (10/16): K 3.9, creatinine 1.32, HCT 39.9 Labs (2/17): K 4.1, creatinine 1.26, BNP 107, digoxin 0.3 Labs (07/10/2015): K 3.9 Creatinine 1.23  Labs (1/18): K 4.1, creatinine 1.18, digoxin 0.3 Labs (2/18): K 4, creatinine 1.24, digoxin 0.4 Labs (5/18): K 4, creatinine 1.25, hgb 14.8, digoxin 0.5 Labs (10/18): K 4, creatinine 1.24, digoxin 0.2 Labs (4/19): K 3.8, creatinine 1.22, digoxin 0.5, LDL 69, HDL 40 Labs (8/19): K 3.9, creatinine 1.35, digoxin 0.3 Labs (12/19): K 4, creatinine 1.22 Labs (1/21): K 4.2, creatinine 1.34, LDL 121 Labs (4/21): digoxin level 0.7, K 3.8, creatinine 1.28 Labs (9/21): digoxin 0.2, hgb 15, K 3.8, creatinine 1.22 Labs (12/21): digoxin 0.5 Labs (1/22): K 3.8, creatinine 1.33 Labs (3/23): K 4.3, creatinine 1.39  PMH: 1. H/o complete heart block: Has Medtronic PPM, placed in 2/15.   2. Cardiomyopathy: Echo (2/15) prior to PPM placement with EF 50-55%.  Echo (6/15) with EF 20-25%,  Cardiolite at that time showed possible scar but no ischemia.  Echo (8/16) with EF 15%, diffuse hypokinesis, mildly dilated RV with normal systolic function, PA systolic pressure 69 mmHg.  LHC/RHC (9/16) with mean RA 4, PA  31/15 mean 20, mean PCWP 8, CI 2.09, 40% mLAD stenosis.  Upgrade to MDT CRT-D in 9/16.  - Echo (4/17) with EF 30-35%.   - LV lead dislodged and loss of BiV pacing.  - Echo (5/18) with EF 20%, mild LV dilation, normal RV size with mildly decreased systolic function, mild AI.  - Echo (10/20): EF < 20%, RV normal, mild MR.  - Echo (12/21): EF <20%, moderate LV dilation with mild LVH, mildly decreased RV systolic function. 3. CKD 4. 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  5. Atrial  tachycardia: paroxysmal. 6. Atrial flutter: s/p ablation in 8/16.  7. OSA 8. Hyperlipidemia 9. Shingles with post-herpetic neuralgia.  10. Atrial fibrillation: Paroxysmal.   FH: Father with CHF, complete heart block with PPM.  Sister with complete heart block and PPM.   SH: Retired Curator, nonsmoker, lives in [place]  ROS: All systems reviewed and negative except as per HPI  Current Outpatient Medications  Medication Sig Dispense Refill   acetaminophen (TYLENOL) 325 MG tablet Take 650 mg by mouth every 6 (six) hours as needed for moderate pain.     amiodarone (PACERONE) 200 MG tablet Take 2 tablets (400 mg total) by mouth 2 (two) times daily for 7 days. Then reduce to '400mg'$  (2 tablets) daily 120 tablet 5   amiodarone (PACERONE) 200 MG tablet Take 2 tablets by mouth 2 times daily for 7 days, THEN 2 tablets daily. 120 tablet 0   apixaban (ELIQUIS) 5 MG TABS tablet Take 1 tablet (5 mg total) by mouth 2 (two) times daily. 180 tablet 3   atorvastatin (LIPITOR) 20 MG tablet Take 1 tablet (20 mg total) by mouth daily. 30 tablet 3   bisoprolol (ZEBETA) 5 MG tablet Take 1/2 tablet (2.5 mg total) by mouth daily. 30 tablet 6   dapagliflozin propanediol (FARXIGA) 10 MG TABS tablet Take 1 tablet by mouth daily. Absolute last refill please call [phone] to schedule follow up 30 tablet 11   diclofenac Sodium (VOLTAREN) 1 % GEL Apply 1 Application topically 4 (four) times daily as needed (pain).     digoxin (LANOXIN) 0.125 MG tablet Take 1/2 tablet (0.0625 mg total) by mouth daily. 15 tablet 6   Multiple Vitamins-Minerals (CERTAVITE/ANTIOXIDANTS) TABS Take 1 tablet by mouth daily with breakfast.     oxymetazoline (AFRIN) 0.05 % nasal spray Place 1 spray into both nostrils 2 (two) times daily as needed for congestion.     potassium chloride (KLOR-CON) 10 MEQ tablet TAKE 1 TABLET BY MOUTH 2 TIMES DAILY. 180 tablet 2   sacubitril-valsartan (ENTRESTO) 24-26 MG Take 1 tablet by mouth 2 (two) times  daily. 60 tablet 11   spironolactone (ALDACTONE) 25 MG tablet TAKE 1 TABLET BY MOUTH ONCE A DAY 90 tablet 3   torsemide (DEMADEX) 20 MG tablet Take 1 tablet (20 mg total) by mouth daily. 30 tablet 6   No current facility-administered medications for this encounter.   BP 110/70   Pulse 79   Wt 87.5 kg (193 lb)   SpO2 99%   BMI 25.46 kg/m  General:  Well appearing. Ambulated into clinic. HEENT: normal Neck: supple. no JVD. Carotids 2+ bilat; no bruits.  Cor: PMI nondisplaced. Regular rate & rhythm. No rubs, gallops or murmurs. Lungs: clear Abdomen: soft, nontender, nondistended.  Extremities: no cyanosis, clubbing, rash, edema Neuro: alert & orientedx3, cranial nerves grossly intact. moves all 4 extremities w/o difficulty. Affect pleasant   Assessment/Plan: 1. Chronic Systolic Heart Failure - NICM, EF  15% on echo in 8/16.  Patient had borderline reduced EF when PPM was placed in 2/15.  After that, EF fell significantly.  He has a history of complete heart block and father had CHF and CHB with pacemaker, and sounds like sister also had CHB with pacemaker. 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  He had CRT-D upgrade.  No significant CAD on last cath.  Echo in 4/17 showed improvement in EF to 30-35%.  Echo in 5/18 showed EF down to 20%.  Echo in 12/21 shows EF < 20% and moderately dilated. Echo this admit with EF 15%. His LV lead became dislodged and he is no longer BiV pacing, now RV pacing > 99% of the time.  Given minimal symptoms, decision was made not to revise his LV lead. He was not markedly fluid overloaded initially at time of this admit. Echo during recent admit EF 15%, severely dilated LV, RV mildly reduced, mild MR, mild AI. Florien 12/05/21 showed low output, CI 1.5-1.6.  PCWP 22. - With low output HF, saw Dr. Lovena Le for f/u 01/03/22 and planning to undergo ICD gen change and placement of new LV lead, scheduled 02/11/22 - He describes  minimal symptoms despite abnormal RHC, suspect he has learned to compensate for low output.  I worry that he could be approaching need for LVAD.  He will need CPX and close followup. CPX recently ordered. - NYHA II. Volume looks good on exam. OptiVol starting to trend up and thoracic impedance starting to trend down. On Torsemide 20 mg daily, take an extra tablet of Torsemide X 1. - continue Farxiga 10 mg daily  - continue Entresto 24-26 mg bid - continue spironolactone 25 mg daily  - continue digoxin 0.125 mg daily - continue bisoprolol 2.5 mg daily - Hold off on hydralazine/imdur w/ soft BP  - Labs today - Recommended genetic testing for LMNA cardiomyopathy etc given CHB, CHF, and family history. Declined in the past, agrees to referral today 2. VT storm/ICD shock: -Received 14 shocks prompting admission 11/23 -In setting of chronic systolic HF + Tikosyn use for AFib + hypomagnesemia  -Tikosyn stopped and loaded with amiodarone. No recurrent shocks on device check -No driving X 6 months after ICD shock 3. Atrial flutter: s/p ablation.  4. Atrial fibrillation: He is in NSR on Amiodarone. Last episode on 12/08. - Continue Eliquis.  5. PE/DVT: remote and associated with surgery.   6. Hyperlipidemia: He has mild CAD.   - No ASA with Eliquis.   - Continue simvastatin 40 mg daily.   7. Complete heart block: Now RV pacing with loss of LV lead, see discussion above.  8. CKD IIIa: -Scr baseline 1.2-1.4. Recently up to 1.7. -Labs today  Followup 2 months with Dr. Aundra Dubin,   [facility], [facility] 01/17/2022

## 2022-01-21 ENCOUNTER — Ambulatory Visit (INDEPENDENT_AMBULATORY_CARE_PROVIDER_SITE_OTHER): Payer: Medicare PPO

## 2022-01-21 DIAGNOSIS — I442 Atrioventricular block, complete: Secondary | ICD-10-CM

## 2022-01-22 LAB — CUP PACEART REMOTE DEVICE CHECK
Battery Remaining Longevity: 1 mo — CL
Battery Voltage: 2.65 V
Brady Statistic AP VP Percent: 11.14 %
Brady Statistic AP VS Percent: 0.01 %
Brady Statistic AS VP Percent: 88.48 %
Brady Statistic AS VS Percent: 0.37 %
Brady Statistic RA Percent Paced: 10.62 %
Brady Statistic RV Percent Paced: 99.28 %
Date Time Interrogation Session: 20231218022826
HighPow Impedance: 56 Ohm
Implantable Lead Connection Status: 753985
Implantable Lead Connection Status: 753985
Implantable Lead Connection Status: 753985
Implantable Lead Implant Date: 20150209
Implantable Lead Implant Date: 20160919
Implantable Lead Implant Date: 20160919
Implantable Lead Location: 753858
Implantable Lead Location: 753859
Implantable Lead Location: 753860
Implantable Lead Model: 4598
Implantable Lead Model: 5076
Implantable Lead Model: 6935
Implantable Pulse Generator Implant Date: 20160919
Lead Channel Impedance Value: 266 Ohm
Lead Channel Impedance Value: 266 Ohm
Lead Channel Impedance Value: 304 Ohm
Lead Channel Impedance Value: 304 Ohm
Lead Channel Impedance Value: 304 Ohm
Lead Channel Impedance Value: 323 Ohm
Lead Channel Impedance Value: 342 Ohm
Lead Channel Impedance Value: 380 Ohm
Lead Channel Impedance Value: 494 Ohm
Lead Channel Impedance Value: 494 Ohm
Lead Channel Impedance Value: 513 Ohm
Lead Channel Impedance Value: 513 Ohm
Lead Channel Impedance Value: 513 Ohm
Lead Channel Pacing Threshold Amplitude: 0.75 V
Lead Channel Pacing Threshold Amplitude: 0.875 V
Lead Channel Pacing Threshold Amplitude: 2.5 V
Lead Channel Pacing Threshold Pulse Width: 0.4 ms
Lead Channel Pacing Threshold Pulse Width: 0.4 ms
Lead Channel Pacing Threshold Pulse Width: 0.4 ms
Lead Channel Sensing Intrinsic Amplitude: 10.75 mV
Lead Channel Sensing Intrinsic Amplitude: 10.75 mV
Lead Channel Sensing Intrinsic Amplitude: 2.375 mV
Lead Channel Sensing Intrinsic Amplitude: 2.375 mV
Lead Channel Setting Pacing Amplitude: 1.5 V
Lead Channel Setting Pacing Amplitude: 2 V
Lead Channel Setting Pacing Pulse Width: 0.4 ms
Lead Channel Setting Sensing Sensitivity: 0.3 mV
Zone Setting Status: 755011

## 2022-01-29 ENCOUNTER — Other Ambulatory Visit: Payer: Self-pay

## 2022-01-30 ENCOUNTER — Other Ambulatory Visit (HOSPITAL_COMMUNITY): Payer: Self-pay

## 2022-01-31 ENCOUNTER — Telehealth: Payer: Self-pay | Admitting: Internal Medicine

## 2022-01-31 NOTE — Telephone Encounter (Signed)
Patient would like to speak to April about his upcoming surgery.

## 2022-01-31 NOTE — Telephone Encounter (Signed)
I spoke with pt and he stated that when he was here for labs he forgot to get his scrub soap. He is coming back tomorrow to pick that up.

## 2022-02-05 ENCOUNTER — Other Ambulatory Visit (HOSPITAL_COMMUNITY): Payer: Self-pay

## 2022-02-07 ENCOUNTER — Encounter (HOSPITAL_COMMUNITY): Payer: Self-pay | Admitting: Internal Medicine

## 2022-02-07 NOTE — Progress Notes (Incomplete)
PCP - Dr Ezequiel Essex Cardiologist - Dr [name]  Chest x-ray - 12/03/21 (1V) EKG - 01/17/22 Stress Test - ? 2021  ECHO - 12/03/21 Cardiac Cath - 12/05/21  BIV ICD - Yes, Medtronic.  Last remote device check was on 01/21/22.   Sleep Study -  Yes CPAP - does not use CPAP  Blood Thinner Instructions:  Follow your surgeon's instructions to stop Eliquis 2 days prior to surgery.  Last dose was on   Anesthesia review: Yes  STOP now taking any Aspirin (unless otherwise instructed by your surgeon), Aleve, Naproxen, Ibuprofen, Motrin, Advil, Goody's, BC's, all herbal medications, fish oil, and all vitamins.   Coronavirus Screening Covid test on  Do you have any of the following symptoms:  Cough {yes/no:20286:::1} Fever (>100.2F)  {yes/no:20286:::1} Runny nose {yes/no:20286:::1} Sore throat {yes/no:20286:::1} Difficulty breathing/shortness of breath  {yes/no:20286:::1}  Have you traveled in the last 14 days and where? {yes/no:20286:::1}  Patient verbalized understanding of instructions that were given via phone.

## 2022-02-08 ENCOUNTER — Encounter (HOSPITAL_COMMUNITY): Payer: Self-pay | Admitting: Internal Medicine

## 2022-02-08 ENCOUNTER — Other Ambulatory Visit: Payer: Self-pay

## 2022-02-08 NOTE — Progress Notes (Signed)
PCP - Lezlie Lye, MD Cardiologist - Dr. Lovena Le  PPM/ICD - yes, Medtronic. Last remote device check was on 01/21/22.  Device Orders - n/a Rep Notified - n/a  Chest x-ray - 12/03/21 EKG - 01/17/22 Stress Test - 2021 ECHO - 12/03/21 Cardiac Cath - 12/05/21  CPAP - OSA positive; patient is not using CPAP  Fasting Blood Sugar - n/a  Blood Thinner Instructions: Eliquis - last dose - 02/08/21 Aspirin Instructions: Patient was instructed: As of today, STOP taking any Aspirin (unless otherwise instructed by your surgeon) Aleve, Naproxen, Ibuprofen, Motrin, Advil, Goody's, BC's, all herbal medications, fish oil, and all vitamins.  ERAS Protcol - n/a  COVID TEST- yes - will be done DOS  Anesthesia review: yes  Patient verbally denies any shortness of breath, fever, cough and chest pain during phone call   -------------  SDW INSTRUCTIONS given:  Your procedure is scheduled on Monday, January 8th, 2024.   Report to [facility] Main Entrance "A" at 12:00 A.M., and check in at the Admitting office.  Call this number if you have problems the morning of surgery:  [phone]   Remember:  Do not eat or drink after midnight the night before your surgery    Take these medicines the morning of surgery with A SIP OF WATER: Amiodarone, Lipitor, Bisoprolol, Digoxin,  PRN: Tylenol, Afrin   The day of surgery:                     Do not wear jewelry,             Do not wear lotions, powders, colognes, or deodorant.            Men may shave face and neck.            Do not bring valuables to the hospital.            [facility] is not responsible for any belongings or valuables.  Do NOT Smoke (Tobacco/Vaping) 24 hours prior to your procedure If you use a CPAP at night, you may bring all equipment for your overnight stay.   Contacts, glasses, dentures or bridgework may not be worn into surgery.      For patients admitted to the hospital, discharge time will be determined by your  treatment team.   Patients discharged the day of surgery will not be allowed to drive home, and someone needs to stay with them for 24 hours.    Special instructions:   [place]- Preparing For Surgery  Before surgery, you can play an important role. Because skin is not sterile, your skin needs to be as free of germs as possible. You can reduce the number of germs on your skin by washing with CHG (chlorahexidine gluconate) Soap before surgery.  CHG is an antiseptic cleaner which kills germs and bonds with the skin to continue killing germs even after washing.    Oral Hygiene is also important to reduce your risk of infection.  Remember - BRUSH YOUR TEETH THE MORNING OF SURGERY WITH YOUR REGULAR TOOTHPASTE  Please do not use if you have an allergy to CHG or antibacterial soaps. If your skin becomes reddened/irritated stop using the CHG.  Do not shave (including legs and underarms) for at least 48 hours prior to first CHG shower. It is OK to shave your face.  Please follow these instructions carefully.   Shower the NIGHT BEFORE SURGERY and the MORNING OF SURGERY with DIAL Soap.  Pat yourself dry with a CLEAN TOWEL.  Wear CLEAN PAJAMAS to bed the night before surgery  Place CLEAN SHEETS on your bed the night of your first shower and DO NOT SLEEP WITH PETS.   Day of Surgery: Please shower morning of surgery  Wear Clean/Comfortable clothing the morning of surgery Do not apply any deodorants/lotions.   Remember to brush your teeth WITH YOUR REGULAR TOOTHPASTE.   Questions were answered. Patient verbalized understanding of instructions.

## 2022-02-11 ENCOUNTER — Ambulatory Visit (HOSPITAL_COMMUNITY)
Admission: RE | Disposition: A | Discharge status: Home / Self Care | Payer: Self-pay | Source: Home / Self Care | Attending: Internal Medicine

## 2022-02-11 ENCOUNTER — Other Ambulatory Visit: Payer: Self-pay

## 2022-02-11 ENCOUNTER — Observation Stay (HOSPITAL_COMMUNITY)
Admission: RE | Admit: 2022-02-11 | Discharge: 2022-02-12 | Disposition: A | Discharge status: Home / Self Care | Payer: Medicare PPO | Attending: Internal Medicine | Admitting: Internal Medicine

## 2022-02-11 ENCOUNTER — Ambulatory Visit (HOSPITAL_COMMUNITY): Payer: Medicare PPO

## 2022-02-11 ENCOUNTER — Ambulatory Visit (HOSPITAL_COMMUNITY): Payer: Medicare PPO | Admitting: Vascular Surgery

## 2022-02-11 ENCOUNTER — Ambulatory Visit (HOSPITAL_BASED_OUTPATIENT_CLINIC_OR_DEPARTMENT_OTHER): Payer: Medicare PPO | Admitting: Vascular Surgery

## 2022-02-11 ENCOUNTER — Encounter (HOSPITAL_COMMUNITY): Payer: Self-pay | Admitting: Internal Medicine

## 2022-02-11 DIAGNOSIS — I5022 Chronic systolic (congestive) heart failure: Secondary | ICD-10-CM

## 2022-02-11 DIAGNOSIS — I110 Hypertensive heart disease with heart failure: Secondary | ICD-10-CM | POA: Diagnosis not present

## 2022-02-11 DIAGNOSIS — I429 Cardiomyopathy, unspecified: Secondary | ICD-10-CM | POA: Diagnosis not present

## 2022-02-11 DIAGNOSIS — I509 Heart failure, unspecified: Secondary | ICD-10-CM

## 2022-02-11 DIAGNOSIS — Z87891 Personal history of nicotine dependence: Secondary | ICD-10-CM

## 2022-02-11 DIAGNOSIS — I442 Atrioventricular block, complete: Secondary | ICD-10-CM | POA: Diagnosis not present

## 2022-02-11 DIAGNOSIS — I4891 Unspecified atrial fibrillation: Secondary | ICD-10-CM | POA: Insufficient documentation

## 2022-02-11 DIAGNOSIS — I472 Ventricular tachycardia, unspecified: Secondary | ICD-10-CM | POA: Insufficient documentation

## 2022-02-11 DIAGNOSIS — Z1152 Encounter for screening for COVID-19: Secondary | ICD-10-CM | POA: Insufficient documentation

## 2022-02-11 DIAGNOSIS — Z95810 Presence of automatic (implantable) cardiac defibrillator: Secondary | ICD-10-CM

## 2022-02-11 DIAGNOSIS — Z86718 Personal history of other venous thrombosis and embolism: Secondary | ICD-10-CM | POA: Diagnosis not present

## 2022-02-11 DIAGNOSIS — I428 Other cardiomyopathies: Principal | ICD-10-CM

## 2022-02-11 DIAGNOSIS — T82111A Breakdown (mechanical) of cardiac pulse generator (battery), initial encounter: Secondary | ICD-10-CM | POA: Insufficient documentation

## 2022-02-11 DIAGNOSIS — Z950 Presence of cardiac pacemaker: Secondary | ICD-10-CM | POA: Diagnosis not present

## 2022-02-11 DIAGNOSIS — Z79899 Other long term (current) drug therapy: Secondary | ICD-10-CM | POA: Diagnosis not present

## 2022-02-11 DIAGNOSIS — Y828 Other medical devices associated with adverse incidents: Secondary | ICD-10-CM | POA: Diagnosis not present

## 2022-02-11 DIAGNOSIS — T82120A Displacement of cardiac electrode, initial encounter: Secondary | ICD-10-CM

## 2022-02-11 DIAGNOSIS — Z7901 Long term (current) use of anticoagulants: Secondary | ICD-10-CM | POA: Diagnosis not present

## 2022-02-11 DIAGNOSIS — I5042 Chronic combined systolic (congestive) and diastolic (congestive) heart failure: Principal | ICD-10-CM | POA: Insufficient documentation

## 2022-02-11 DIAGNOSIS — Z4502 Encounter for adjustment and management of automatic implantable cardiac defibrillator: Secondary | ICD-10-CM | POA: Diagnosis not present

## 2022-02-11 HISTORY — PX: BIV ICD GENERATOR CHANGEOUT: EP1194

## 2022-02-11 HISTORY — PX: LEAD EXTRACTION: EP1211

## 2022-02-11 LAB — SURGICAL PCR SCREEN
MRSA, PCR: NEGATIVE
Staphylococcus aureus: NEGATIVE

## 2022-02-11 LAB — CBC
HCT: 40.1 % (ref 39.0–52.0)
Hemoglobin: 13.6 g/dL (ref 13.0–17.0)
MCH: 31.9 pg (ref 26.0–34.0)
MCHC: 33.9 g/dL (ref 30.0–36.0)
MCV: 94.1 fL (ref 80.0–100.0)
Platelets: 158 10*3/uL (ref 150–400)
RBC: 4.26 MIL/uL (ref 4.22–5.81)
RDW: 16.4 % — ABNORMAL HIGH (ref 11.5–15.5)
WBC: 4 10*3/uL (ref 4.0–10.5)
nRBC: 0 % (ref 0.0–0.2)

## 2022-02-11 LAB — BASIC METABOLIC PANEL
Anion gap: 8 (ref 5–15)
BUN: 10 mg/dL (ref 8–23)
CO2: 23 mmol/L (ref 22–32)
Calcium: 9.1 mg/dL (ref 8.9–10.3)
Chloride: 106 mmol/L (ref 98–111)
Creatinine, Ser: 1.19 mg/dL (ref 0.61–1.24)
GFR, Estimated: >60 mL/min (ref >60)
Glucose, Bld: 109 mg/dL — ABNORMAL HIGH (ref 70–99)
Potassium: 4.2 mmol/L (ref 3.5–5.1)
Sodium: 137 mmol/L (ref 135–145)

## 2022-02-11 LAB — ABO/RH: ABO/RH(D): O POS

## 2022-02-11 LAB — ECHO INTRAOPERATIVE TEE

## 2022-02-11 LAB — PREPARE RBC (CROSSMATCH)

## 2022-02-11 LAB — SARS CORONAVIRUS 2 BY RT PCR: SARS Coronavirus 2 by RT PCR: NEGATIVE

## 2022-02-11 SURGERY — LEAD EXTRACTION
Anesthesia: General

## 2022-02-11 MED ORDER — GENTAMICIN IN SALINE 1.6-0.9 MG/ML-% IV SOLN
INTRAVENOUS | Status: AC
  Filled 2022-02-11: qty 50

## 2022-02-11 MED ORDER — CEFAZOLIN SODIUM-DEXTROSE 2-4 GM/100ML-% IV SOLN
2.0000 g | INTRAVENOUS | Status: AC
  Administered 2022-02-11: 2 g via INTRAVENOUS
  Filled 2022-02-11: qty 100

## 2022-02-11 MED ORDER — FENTANYL CITRATE (PF) 100 MCG/2ML IJ SOLN: 25.0000 ug | INTRAMUSCULAR | Status: DC | PRN

## 2022-02-11 MED ORDER — OXYCODONE HCL 5 MG/5ML PO SOLN: 5.0000 mg | Freq: Once | ORAL | Status: DC | PRN

## 2022-02-11 MED ORDER — PHENYLEPHRINE HCL-NACL 20-0.9 MG/250ML-% IV SOLN
INTRAVENOUS | Status: DC | PRN
  Administered 2022-02-11: 20 ug/min via INTRAVENOUS

## 2022-02-11 MED ORDER — DAPAGLIFLOZIN PROPANEDIOL 10 MG PO TABS
10.0000 mg | ORAL_TABLET | Freq: Every day | ORAL | Status: DC
  Administered 2022-02-12: 10 mg via ORAL
  Filled 2022-02-11: qty 1

## 2022-02-11 MED ORDER — ACETAMINOPHEN 325 MG PO TABS: 650.0000 mg | ORAL_TABLET | Freq: Four times a day (QID) | ORAL | Status: DC | PRN

## 2022-02-11 MED ORDER — ADULT MULTIVITAMIN W/MINERALS CH
1.0000 | ORAL_TABLET | Freq: Every day | ORAL | Status: DC
  Administered 2022-02-12: 1 via ORAL
  Filled 2022-02-11: qty 1

## 2022-02-11 MED ORDER — AMIODARONE HCL 200 MG PO TABS
200.0000 mg | ORAL_TABLET | Freq: Two times a day (BID) | ORAL | Status: DC
  Administered 2022-02-12: 200 mg via ORAL
  Filled 2022-02-11: qty 1

## 2022-02-11 MED ORDER — SACUBITRIL-VALSARTAN 24-26 MG PO TABS
1.0000 | ORAL_TABLET | Freq: Two times a day (BID) | ORAL | Status: DC
  Administered 2022-02-11 – 2022-02-12 (×2): 1 via ORAL
  Filled 2022-02-11 (×2): qty 1

## 2022-02-11 MED ORDER — POTASSIUM CHLORIDE ER 10 MEQ PO TBCR
10.0000 meq | EXTENDED_RELEASE_TABLET | Freq: Two times a day (BID) | ORAL | Status: DC
  Administered 2022-02-11 – 2022-02-12 (×2): 10 meq via ORAL
  Filled 2022-02-11 (×5): qty 1

## 2022-02-11 MED ORDER — DIGOXIN 125 MCG PO TABS
0.0625 mg | ORAL_TABLET | Freq: Every day | ORAL | Status: DC
  Administered 2022-02-11 – 2022-02-12 (×2): 0.0625 mg via ORAL
  Filled 2022-02-11 (×2): qty 1

## 2022-02-11 MED ORDER — SODIUM CHLORIDE 0.9% IV SOLUTION: Freq: Once | INTRAVENOUS | Status: DC

## 2022-02-11 MED ORDER — CEFAZOLIN SODIUM-DEXTROSE 2-4 GM/100ML-% IV SOLN
2.0000 g | Freq: Once | INTRAVENOUS | Status: AC
  Administered 2022-02-12: 2 g via INTRAVENOUS
  Filled 2022-02-11: qty 100

## 2022-02-11 MED ORDER — SPIRONOLACTONE 25 MG PO TABS
25.0000 mg | ORAL_TABLET | Freq: Every day | ORAL | Status: DC
  Administered 2022-02-11 – 2022-02-12 (×2): 25 mg via ORAL
  Filled 2022-02-11 (×2): qty 1

## 2022-02-11 MED ORDER — SODIUM CHLORIDE 0.9 % IV SOLN
80.0000 mg | INTRAVENOUS | Status: AC
  Administered 2022-02-11: 80 mg

## 2022-02-11 MED ORDER — OXYCODONE HCL 5 MG PO TABS: 5.0000 mg | ORAL_TABLET | Freq: Once | ORAL | Status: DC | PRN

## 2022-02-11 MED ORDER — SODIUM CHLORIDE 0.9 % IV SOLN
INTRAVENOUS | Status: AC
  Filled 2022-02-11: qty 2

## 2022-02-11 MED ORDER — LACTATED RINGERS IV SOLN: INTRAVENOUS | Status: DC

## 2022-02-11 MED ORDER — FENTANYL CITRATE (PF) 250 MCG/5ML IJ SOLN
INTRAMUSCULAR | Status: DC | PRN
  Administered 2022-02-11 (×2): 50 ug via INTRAVENOUS

## 2022-02-11 MED ORDER — TORSEMIDE 20 MG PO TABS
20.0000 mg | ORAL_TABLET | Freq: Every day | ORAL | Status: DC
  Administered 2022-02-11 – 2022-02-12 (×2): 20 mg via ORAL
  Filled 2022-02-11 (×2): qty 1

## 2022-02-11 MED ORDER — SODIUM CHLORIDE 0.9 % IV SOLN: INTRAVENOUS | Status: DC

## 2022-02-11 MED ORDER — LIDOCAINE HCL (PF) 1 % IJ SOLN
INTRAMUSCULAR | Status: DC | PRN
  Administered 2022-02-11: 60 mL

## 2022-02-11 MED ORDER — AMIODARONE HCL 200 MG PO TABS
400.0000 mg | ORAL_TABLET | Freq: Two times a day (BID) | ORAL | Status: DC
  Administered 2022-02-11: 400 mg via ORAL
  Filled 2022-02-11: qty 2

## 2022-02-11 MED ORDER — LIDOCAINE 2% (20 MG/ML) 5 ML SYRINGE
INTRAMUSCULAR | Status: DC | PRN
  Administered 2022-02-11: 40 mg via INTRAVENOUS

## 2022-02-11 MED ORDER — ROCURONIUM BROMIDE 10 MG/ML (PF) SYRINGE
PREFILLED_SYRINGE | INTRAVENOUS | Status: DC | PRN
  Administered 2022-02-11: 60 mg via INTRAVENOUS
  Administered 2022-02-11: 30 mg via INTRAVENOUS
  Administered 2022-02-11: 40 mg via INTRAVENOUS

## 2022-02-11 MED ORDER — MIDAZOLAM HCL 2 MG/2ML IJ SOLN
INTRAMUSCULAR | Status: DC | PRN
  Administered 2022-02-11: 2 mg via INTRAVENOUS

## 2022-02-11 MED ORDER — IOHEXOL 350 MG/ML SOLN
INTRAVENOUS | Status: DC | PRN
  Administered 2022-02-11: 25 mL

## 2022-02-11 MED ORDER — ETOMIDATE 2 MG/ML IV SOLN
INTRAVENOUS | Status: DC | PRN
  Administered 2022-02-11: 20 mg via INTRAVENOUS

## 2022-02-11 MED ORDER — ONDANSETRON HCL 4 MG/2ML IJ SOLN
INTRAMUSCULAR | Status: DC | PRN
  Administered 2022-02-11: 4 mg via INTRAVENOUS

## 2022-02-11 MED ORDER — FENTANYL CITRATE (PF) 100 MCG/2ML IJ SOLN
INTRAMUSCULAR | Status: AC
  Filled 2022-02-11: qty 2

## 2022-02-11 MED ORDER — ONDANSETRON HCL 4 MG/2ML IJ SOLN: 4.0000 mg | Freq: Four times a day (QID) | INTRAMUSCULAR | Status: DC | PRN

## 2022-02-11 MED ORDER — DEXAMETHASONE SODIUM PHOSPHATE 10 MG/ML IJ SOLN
INTRAMUSCULAR | Status: DC | PRN
  Administered 2022-02-11: 5 mg via INTRAVENOUS

## 2022-02-11 MED ORDER — CHLORHEXIDINE GLUCONATE 4 % EX LIQD
4.0000 | Freq: Once | CUTANEOUS | Status: DC
  Filled 2022-02-11: qty 60

## 2022-02-11 MED ORDER — LIDOCAINE HCL (PF) 1 % IJ SOLN
INTRAMUSCULAR | Status: AC
  Filled 2022-02-11: qty 60

## 2022-02-11 MED ORDER — POVIDONE-IODINE 10 % EX SWAB
2.0000 | Freq: Once | CUTANEOUS | Status: AC
  Administered 2022-02-11: 2 via TOPICAL

## 2022-02-11 MED ORDER — CHLORHEXIDINE GLUCONATE 0.12 % MT SOLN
OROMUCOSAL | Status: AC
  Filled 2022-02-11: qty 15

## 2022-02-11 MED ORDER — SUGAMMADEX SODIUM 200 MG/2ML IV SOLN
INTRAVENOUS | Status: DC | PRN
  Administered 2022-02-11: 177.8 mg via INTRAVENOUS

## 2022-02-11 MED ORDER — HEPARIN (PORCINE) IN NACL 1000-0.9 UT/500ML-% IV SOLN
INTRAVENOUS | Status: DC | PRN
  Administered 2022-02-11: 500 mL

## 2022-02-11 MED ORDER — CHLORHEXIDINE GLUCONATE 0.12 % MT SOLN
15.0000 mL | Freq: Once | OROMUCOSAL | Status: AC
  Administered 2022-02-11: 15 mL via OROMUCOSAL

## 2022-02-11 MED ORDER — BISOPROLOL FUMARATE 5 MG PO TABS
2.5000 mg | ORAL_TABLET | Freq: Every day | ORAL | Status: DC
  Administered 2022-02-11 – 2022-02-12 (×2): 2.5 mg via ORAL
  Filled 2022-02-11 (×2): qty 1

## 2022-02-11 MED ORDER — OXYMETAZOLINE HCL 0.05 % NA SOLN: 1.0000 | Freq: Two times a day (BID) | NASAL | Status: DC | PRN

## 2022-02-11 MED ORDER — ATORVASTATIN CALCIUM 10 MG PO TABS
20.0000 mg | ORAL_TABLET | Freq: Every day | ORAL | Status: DC
  Administered 2022-02-11 – 2022-02-12 (×2): 20 mg via ORAL
  Filled 2022-02-11 (×2): qty 2

## 2022-02-11 MED ORDER — MIDAZOLAM HCL 2 MG/2ML IJ SOLN
INTRAMUSCULAR | Status: AC
  Filled 2022-02-11: qty 2

## 2022-02-11 SURGICAL SUPPLY — 22 items
CABLE SURGICAL S-101-97-12 (CABLE) ×2 IMPLANT
CATH ATTAIN COM SURV 6250V-EH (CATHETERS) ×1 IMPLANT
CATH JOSEPH QUAD ALLRED 6F REP (CATHETERS) ×1 IMPLANT
CATH RIGHTSITE C315HIS02 (CATHETERS) ×1 IMPLANT
FELT TEFLON 1X6 (MISCELLANEOUS) ×2 IMPLANT
GUIDEWIRE ANGLED .035X150CM (WIRE) ×1 IMPLANT
ICD COBALT XT CRT DTPA2D1 (ICD Generator) ×1 IMPLANT
LEAD SELECT SECURE 3830 383069 (Lead) IMPLANT
PAD DEFIB RADIO PHYSIO CONN (PAD) ×2 IMPLANT
POUCH AIGIS-R ANTIBACT ICD (Mesh General) ×1 IMPLANT
POUCH AIGIS-R ANTIBACT ICD LRG (Mesh General) IMPLANT
SELECT SECURE 3830 383069 (Lead) ×1 IMPLANT
SHEATH 11 SUB-C ROTATE DILATOR (SHEATH) ×1 IMPLANT
SHEATH 9.5FR PRELUDE SNAP 13 (SHEATH) ×1 IMPLANT
SHEATH PINNACLE 6F 10CM (SHEATH) ×1 IMPLANT
SHEATH PROBE COVER 6X72 (BAG) ×1 IMPLANT
SHEATH TIGHTRAIL MECH 11F (SHEATH) ×1 IMPLANT
SLITTER 6232ADJ (MISCELLANEOUS) ×1 IMPLANT
STYLET LOCKING LLD E (CATHETERS) ×1 IMPLANT
TRAY PACEMAKER INSERTION (PACKS) ×2 IMPLANT
WIRE HI TORQ VERSACORE-J 145CM (WIRE) ×1 IMPLANT
WIRE MAILMAN 182CM (WIRE) ×1 IMPLANT

## 2022-02-11 NOTE — Anesthesia Procedure Notes (Signed)
Procedure Name: Intubation Date/Time: 02/11/2022 3:08 PM  Performed by: Darletta Moll, CRNAPre-anesthesia Checklist: Patient identified, Emergency Drugs available, Suction available and Patient being monitored Patient Re-evaluated:Patient Re-evaluated prior to induction Oxygen Delivery Method: Circle system utilized Preoxygenation: Pre-oxygenation with 100% oxygen Induction Type: IV induction Ventilation: Mask ventilation without difficulty and Oral airway inserted - appropriate to patient size Laryngoscope Size: Mac and 3 Grade View: Grade II Tube type: Oral Tube size: 7.5 mm Number of attempts: 1 Airway Equipment and Method: Stylet Placement Confirmation: ETT inserted through vocal cords under direct vision, positive ETCO2 and breath sounds checked- equal and bilateral Secured at: 2 cm Tube secured with: Tape Dental Injury: Teeth and Oropharynx as per pre-operative assessment  Comments: DLx1 by CRNA, unable to lift epiglottis using Mac 4 blade. DLx1 by MDA Grade 2 view, successful intubation with Mac 3.

## 2022-02-11 NOTE — Progress Notes (Signed)
  Echocardiogram Echocardiogram Transesophageal has been performed.  Philip Chavez 02/11/2022, 3:29 PM

## 2022-02-11 NOTE — H&P (Signed)
HPI [name] returns today for followup. He is a pleasant 72 yo man with a h/o non-ischemic CM, CHB, s/p PPM insertion pacing induced LBBB and subsequent upgrade to a BIV ICD. He then developed ICD lead dislodgement and lost biv pacing. He has had progressively worsening CHF symptoms. He has not had syncope. He denies chest pain. His CHF symptoms are class 3. He has recently had VT/VF storm. He has not had more ICD shocks since DC on amiodarone. In addition, he is the sole provider for his wife who has dementia and is bed bound.  No Known Allergies           Current Outpatient Medications  Medication Sig Dispense Refill   acetaminophen (TYLENOL) 325 MG tablet Take 650 mg by mouth every 6 (six) hours as needed for moderate pain.       amiodarone (PACERONE) 200 MG tablet Take 2 tablets (400 mg total) by mouth 2 (two) times daily for 7 days. Then reduce to '400mg'$  (2 tablets) daily 120 tablet 5   amiodarone (PACERONE) 200 MG tablet Take 2 tablets by mouth 2 times daily for 7 days, THEN 2 tablets daily. 120 tablet 0   apixaban (ELIQUIS) 5 MG TABS tablet Take 1 tablet (5 mg total) by mouth 2 (two) times daily. 180 tablet 3   atorvastatin (LIPITOR) 20 MG tablet Take 1 tablet (20 mg total) by mouth daily. 30 tablet 3   bisoprolol (ZEBETA) 5 MG tablet Take 1/2 tablet (2.5 mg total) by mouth daily. 30 tablet 6   dapagliflozin propanediol (FARXIGA) 10 MG TABS tablet Take 1 tablet by mouth daily. Absolute last refill please call [phone] to schedule follow up 30 tablet 11   diclofenac Sodium (VOLTAREN) 1 % GEL Apply 1 Application topically 4 (four) times daily as needed (pain).       digoxin (LANOXIN) 0.125 MG tablet Take 1/2 tablet (0.0625 mg total) by mouth daily. 15 tablet 6   Multiple Vitamins-Minerals (CERTAVITE/ANTIOXIDANTS) TABS Take 1 tablet by mouth daily with breakfast.       oxymetazoline (AFRIN) 0.05 % nasal spray Place 1 spray into both nostrils 2 (two) times daily as needed  for congestion.       potassium chloride (KLOR-CON) 10 MEQ tablet TAKE 1 TABLET BY MOUTH 2 TIMES DAILY. 180 tablet 2   sacubitril-valsartan (ENTRESTO) 24-26 MG Take 1 tablet by mouth 2 (two) times daily. 60 tablet 11   spironolactone (ALDACTONE) 25 MG tablet TAKE 1 TABLET BY MOUTH ONCE A DAY 90 tablet 3   torsemide (DEMADEX) 20 MG tablet Take 1 tablet (20 mg total) by mouth daily. 30 tablet 6    No current facility-administered medications for this visit.            Past Medical History:  Diagnosis Date   AICD (automatic cardioverter/defibrillator) present 0919/2016    BIV    Annual physical exam 06/09/2019   Atrial flutter ([place])      a. 08/2014 presented w/ aflutter->converted on amio;  b. CHA2DS2VASc = 2 -->Xarelto.   Atrial tachycardia     Chronic combined systolic and diastolic CHF (congestive heart failure) ([place])      a. 03/2013 Echo EF 50-55%;  b. 07/2013 Echo EF 20-25%, diff HK, Gr3 DD;  c. 08/2014 Echo: EF 15%, diff HK, mild AI/MR, mildly dil LA/RV, mod TR, PASP 52mHg.   DVT (deep venous thrombosis) ([place]      a. 2007 RLE: S/P  ankle surgery;  b. 03/2013 LLE DVT & PE-->Xarelto.   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 2007 RLE: S/P ankle surgery;  b. 03/2013 LLE DVT & PE-->Xarelto.   Hypercholesterolemia     Hypercoagulable state ([place])     Hypertension     Musculoskeletal neck pain     Nonischemic cardiomyopathy ([place])      a. 07/2013 Echo: EF 20-25%;  b. 07/2013 Myoview: Large inferior, lateral, apical scar w/ HK, no ischemia, EF 17%;  b. 08/2014 Echo: EF 15%, diff HK.   Pneumonia ~ 04/2014; 09/02/2014   Presence of permanent cardiac pacemaker      a. 03/2013 s/p MDT ADDRL1 Adapta DC PPM, ser # [id number].   Sleep apnea      does not wear mask (09/02/2014)   Third degree heart block ([place])      a. 03/2013 s/p MDT ADDRL1 Adapta DC PPM, ser # [id number].      ROS:    All systems reviewed and negative except as noted in the HPI.          Past Surgical History:   Procedure Laterality Date   CARDIAC CATHETERIZATION N/A 10/17/2014    Procedure: Right/Left Heart Cath and Coronary Angiography;  Surgeon: Larey Dresser, MD;  Location: [facility];  Service: Cardiovascular;  Laterality: N/A;   CARDIOVERSION N/A 07/26/2020    Procedure: CARDIOVERSION;  Surgeon: Buford Dresser, MD;  Location: [facility];  Service: Cardiovascular;  Laterality: N/A;   COLONOSCOPY WITH PROPOFOL N/A 11/28/2021    Procedure: COLONOSCOPY WITH PROPOFOL;  Surgeon: Wilford Corner, MD;  Location: [facility];  Service: Gastroenterology;  Laterality: N/A;   ELECTROPHYSIOLOGIC STUDY N/A 09/07/2014    Procedure: A-Flutter;  Surgeon: Evans Lance, MD;  Location: [facility];  Service: Cardiovascular;  Laterality: N/A;   EP IMPLANTABLE DEVICE   10/24/2014    BIV   EP IMPLANTABLE DEVICE N/A 10/24/2014    Procedure: BiV ICD Upgrade;  Surgeon: Evans Lance, MD;  Location: [facility];  Service: Cardiovascular;  Laterality: N/A;   FOOT FRACTURE SURGERY Right 2007   FRACTURE SURGERY       INGUINAL HERNIA REPAIR Right 1980's   INGUINAL HERNIA REPAIR Right 09/22/2020    Procedure: LAPAROSCOPIC RIGHT INGUINAL HERNIA REPAIR WITH MESH;  Surgeon: Ralene Ok, MD;  Location: [facility];  Service: General;  Laterality: Right;   INSERT / REPLACE / REMOVE PACEMAKER        MDT ADDRL1 pacemaker implanted by Dr Lovena Le for complete heart block   PERMANENT PACEMAKER INSERTION N/A 03/15/2013    Procedure: PERMANENT PACEMAKER INSERTION;  Surgeon: Evans Lance, MD;  Location: [facility];  Service: Cardiovascular;  Laterality: N/A;   POLYPECTOMY   11/28/2021    Procedure: POLYPECTOMY;  Surgeon: Wilford Corner, MD;  Location: [facility];  Service: Gastroenterology;;   RIGHT HEART CATH N/A 12/05/2021    Procedure: RIGHT HEART CATH;  Surgeon: Larey Dresser, MD;  Location: [facility];  Service: Cardiovascular;  Laterality: N/A;   SCLEROTHERAPY   11/28/2021     Procedure: SCLEROTHERAPY;  Surgeon: Wilford Corner, MD;  [facility];  Service: Gastroenterology;;   SUBMUCOSAL TATTOO INJECTION   11/28/2021    Procedure: SUBMUCOSAL TATTOO INJECTION;  Surgeon: Wilford Corner, MD;  [facility];  Service: Gastroenterology;;             Family History  Problem Relation Age of Onset  Diabetes type II Mother     Heart disease Father     Arrhythmia Father     Arrhythmia Sister     Hypertension Neg Hx     Heart attack Neg Hx     Stroke Neg Hx          Social History         Socioeconomic History   Marital status: Married      Spouse name: Luellen Pucker   Number of children: 2   Years of education: Not on file   Highest education level: Not on file  Occupational History   Not on file  Tobacco Use   Smoking status: Former      Packs/day: 1.50      Years: 10.00      Total pack years: 15.00      Types: Cigarettes      Quit date: 02/04/1982      Years since quitting: 39.9      Passive exposure: Past   Smokeless tobacco: Never   Tobacco comments:      09/02/2014  "quit smoking 20-30 yr ago"  Vaping Use   Vaping Use: Never used  Substance and Sexual Activity   Alcohol use: No   Drug use: No   Sexual activity: Yes  Other Topics Concern   Not on file  Social History Narrative   Not on file    Social Determinants of Health        Financial Resource Strain: Not on file  Food Insecurity: No Food Insecurity (08/30/2021)    Hunger Vital Sign     Worried About Running Out of Food in the Last Year: Never true     Ran Out of Food in the Last Year: Never true  Transportation Needs: No Transportation Needs (08/30/2021)    PRAPARE - Armed forces logistics/support/administrative officer (Medical): No     Lack of Transportation (Non-Medical): No  Physical Activity: Not on file  Stress: Not on file  Social Connections: Not on file  Intimate Partner Violence: Not on file        BP (!) 92/50   Pulse 61   Ht '6\' 1"'$  (1.854 m)   Wt 187  lb 9.6 oz (85.1 kg)   SpO2 98%   BMI 24.75 kg/m    Physical Exam:   Well appearing NAD HEENT: Unremarkable Neck:  No JVD, no thyromegally Lymphatics:  No adenopathy Back:  No CVA tenderness Lungs:  Clear with minimal basilar rales. HEART:  Regular rate rhythm, no murmurs, no rubs, no clicks Abd:  soft, positive bowel sounds, no organomegally, no rebound, no guarding Ext:  2 plus pulses, no edema, no cyanosis, no clubbing Skin:  No rashes no nodules Neuro:  CN II through XII intact, motor grossly intact   EKG - nsr with ventricular pacing and QRS of 204 ms.   DEVICE  Normal device function.  See PaceArt for details.    Assess/Plan: Chronic systolic heart failure - I have recommend that we plan to place a new LV lead. I have discussed the indications/risks/benefits/goals/expectations and he wishes to proceed. VT - he will continue amiodarone. I hope to wean down if we can get him resychronized. PAF - he is maintaining NSR. His dofetilide was stopped.   Salome Spotted  EP Attending  Patient seen and examined. Agree with above. We will plan to remove his dislodged LV lead and insert a new LV lead.  Carleene Overlie Omarion Minnehan,MD

## 2022-02-11 NOTE — Anesthesia Preprocedure Evaluation (Addendum)
Anesthesia Evaluation  Patient identified by MRN, date of birth, ID band Patient awake    Reviewed: Allergy & Precautions, H&P , NPO status , Patient's Chart, lab work & pertinent test results  Airway Mallampati: II   Neck ROM: full    Dental   Pulmonary sleep apnea , former smoker   breath sounds clear to auscultation       Cardiovascular hypertension, +CHF  + dysrhythmias Atrial Fibrillation + pacemaker + Cardiac Defibrillator  Rhythm:regular Rate:Normal  EF 15%   Neuro/Psych    GI/Hepatic   Endo/Other    Renal/GU      Musculoskeletal   Abdominal   Peds  Hematology   Anesthesia Other Findings   Reproductive/Obstetrics                             Anesthesia Physical Anesthesia Plan  ASA: 4  Anesthesia Plan: General   Post-op Pain Management:    Induction: Intravenous  PONV Risk Score and Plan: 2 and Ondansetron, Dexamethasone, Midazolam and Treatment may vary due to age or medical condition  Airway Management Planned: Oral ETT  Additional Equipment: Arterial line and TEE  Intra-op Plan:   Post-operative Plan: Extubation in OR  Informed Consent: I have reviewed the patients History and Physical, chart, labs and discussed the procedure including the risks, benefits and alternatives for the proposed anesthesia with the patient or authorized representative who has indicated his/her understanding and acceptance.     Dental advisory given  Plan Discussed with: CRNA, Anesthesiologist and Surgeon  Anesthesia Plan Comments:        Anesthesia Quick Evaluation

## 2022-02-11 NOTE — Anesthesia Procedure Notes (Signed)
Arterial Line Insertion Start/End1/09/2022 2:45 PM Performed by: Valda Favia, CRNA, CRNA  Patient location: OOR procedure area. Preanesthetic checklist: patient identified, IV checked, site marked, risks and benefits discussed, surgical consent, monitors and equipment checked, pre-op evaluation, timeout performed and anesthesia consent Lidocaine 1% used for infiltration Right, radial was placed Catheter size: 20 G Hand hygiene performed  and maximum sterile barriers used   Attempts: 1 Procedure performed without using ultrasound guided technique. Following insertion, dressing applied and Biopatch. Post procedure assessment: normal and unchanged  Patient tolerated the procedure well with no immediate complications.

## 2022-02-11 NOTE — Transfer of Care (Signed)
Immediate Anesthesia Transfer of Care Note  Patient: Philip Chavez  Procedure(s) Performed: LEAD EXTRACTION BIV ICD GENERATOR CHANGEOUT  Patient Location: PACU  Anesthesia Type:General  Level of Consciousness: awake and alert   Airway & Oxygen Therapy: Patient Spontanous Breathing and Patient connected to nasal cannula oxygen  Post-op Assessment: Report given to RN and Post -op Vital signs reviewed and stable  Post vital signs: Reviewed and stable  Last Vitals:  Vitals Value Taken Time  BP 98/71 02/11/22 1800  Temp 36.4 C 02/11/22 1755  Pulse 89 02/11/22 1807  Resp 20 02/11/22 1807  SpO2 96% 02/11/22 1807  Vitals shown include unvalidated device data.  Last Pain:  Vitals:   02/11/22 1755  TempSrc:   PainSc: 0-No pain         Complications: No notable events documented.

## 2022-02-12 ENCOUNTER — Observation Stay (HOSPITAL_COMMUNITY): Payer: Medicare PPO

## 2022-02-12 ENCOUNTER — Encounter (HOSPITAL_COMMUNITY): Payer: Self-pay | Admitting: Internal Medicine

## 2022-02-12 ENCOUNTER — Other Ambulatory Visit (HOSPITAL_COMMUNITY): Payer: Self-pay

## 2022-02-12 DIAGNOSIS — Z79899 Other long term (current) drug therapy: Secondary | ICD-10-CM | POA: Diagnosis not present

## 2022-02-12 DIAGNOSIS — I5022 Chronic systolic (congestive) heart failure: Secondary | ICD-10-CM | POA: Diagnosis not present

## 2022-02-12 DIAGNOSIS — T82111A Breakdown (mechanical) of cardiac pulse generator (battery), initial encounter: Secondary | ICD-10-CM | POA: Diagnosis not present

## 2022-02-12 DIAGNOSIS — Z95810 Presence of automatic (implantable) cardiac defibrillator: Secondary | ICD-10-CM | POA: Diagnosis not present

## 2022-02-12 DIAGNOSIS — I429 Cardiomyopathy, unspecified: Secondary | ICD-10-CM

## 2022-02-12 DIAGNOSIS — I517 Cardiomegaly: Secondary | ICD-10-CM | POA: Diagnosis not present

## 2022-02-12 DIAGNOSIS — Z1152 Encounter for screening for COVID-19: Secondary | ICD-10-CM | POA: Diagnosis not present

## 2022-02-12 DIAGNOSIS — I4891 Unspecified atrial fibrillation: Secondary | ICD-10-CM | POA: Diagnosis not present

## 2022-02-12 DIAGNOSIS — I5042 Chronic combined systolic (congestive) and diastolic (congestive) heart failure: Secondary | ICD-10-CM | POA: Diagnosis not present

## 2022-02-12 DIAGNOSIS — T82120A Displacement of cardiac electrode, initial encounter: Secondary | ICD-10-CM | POA: Diagnosis not present

## 2022-02-12 DIAGNOSIS — Z7901 Long term (current) use of anticoagulants: Secondary | ICD-10-CM | POA: Diagnosis not present

## 2022-02-12 DIAGNOSIS — I472 Ventricular tachycardia, unspecified: Secondary | ICD-10-CM | POA: Diagnosis not present

## 2022-02-12 DIAGNOSIS — I442 Atrioventricular block, complete: Secondary | ICD-10-CM | POA: Diagnosis not present

## 2022-02-12 LAB — BASIC METABOLIC PANEL
Anion gap: 9 (ref 5–15)
BUN: 14 mg/dL (ref 8–23)
CO2: 24 mmol/L (ref 22–32)
Calcium: 8.9 mg/dL (ref 8.9–10.3)
Chloride: 102 mmol/L (ref 98–111)
Creatinine, Ser: 1.39 mg/dL — ABNORMAL HIGH (ref 0.61–1.24)
GFR, Estimated: 54 mL/min — ABNORMAL LOW (ref >60)
Glucose, Bld: 166 mg/dL — ABNORMAL HIGH (ref 70–99)
Potassium: 4.1 mmol/L (ref 3.5–5.1)
Sodium: 135 mmol/L (ref 135–145)

## 2022-02-12 LAB — CBC
HCT: 38.6 % — ABNORMAL LOW (ref 39.0–52.0)
Hemoglobin: 12.7 g/dL — ABNORMAL LOW (ref 13.0–17.0)
MCH: 31.1 pg (ref 26.0–34.0)
MCHC: 32.9 g/dL (ref 30.0–36.0)
MCV: 94.4 fL (ref 80.0–100.0)
Platelets: 142 10*3/uL — ABNORMAL LOW (ref 150–400)
RBC: 4.09 MIL/uL — ABNORMAL LOW (ref 4.22–5.81)
RDW: 16.6 % — ABNORMAL HIGH (ref 11.5–15.5)
WBC: 6.8 10*3/uL (ref 4.0–10.5)
nRBC: 0 % (ref 0.0–0.2)

## 2022-02-12 MED ORDER — AMIODARONE HCL 200 MG PO TABS
200.0000 mg | ORAL_TABLET | Freq: Two times a day (BID) | ORAL | 5 refills | Status: DC
  Filled 2022-02-12: qty 60, 30d supply, fill #0

## 2022-02-12 MED ORDER — MELATONIN 3 MG PO TABS
3.0000 mg | ORAL_TABLET | Freq: Every day | ORAL | Status: DC
  Administered 2022-02-12: 3 mg via ORAL
  Filled 2022-02-12: qty 1

## 2022-02-12 MED FILL — Gentamicin Sulfate Inj 40 MG/ML: INTRAMUSCULAR | Qty: 80 | Status: AC

## 2022-02-12 NOTE — Discharge Instructions (Signed)
  DO NOT REMOVE THE LARGE BULKY DRESSING YOU HAVE AN APPOINTMENT TO HAVE THIS REMOVED ON THURSDAY THEN FOLLOW WOUND INSTRUCTIONS BELOW        Supplemental Discharge Instructions for  Pacemaker/Defibrillator Patients    Activity No heavy lifting or vigorous activity with your left/right arm for 6 to 8 weeks.  Do not raise your left/right arm above your head for one week.  Gradually raise your affected arm as drawn below.             02/16/22                     02/17/22                    02/18/22                     02/19/22 __  NO DRIVING until your current driving restriction is completed and you are cleared by your physician  WOUND CARE Keep the wound area clean and dry.  Do not get this area wet , no showers until cleared to at your wound check visit . The tape/steri-strips on your wound will fall off; do not pull them off.  No bandage is needed on the site.  DO  NOT apply any creams, oils, or ointments to the wound area. If you notice any drainage or discharge from the wound, any swelling or bruising at the site, or you develop a fever > 101? F after you are discharged home, call the office at once.  Special Instructions You are still able to use cellular telephones; use the ear opposite the side where you have your pacemaker/defibrillator.  Avoid carrying your cellular phone near your device. When traveling through airports, show security personnel your identification card to avoid being screened in the metal detectors.  Ask the security personnel to use the hand wand. Avoid arc welding equipment, MRI testing (magnetic resonance imaging), TENS units (transcutaneous nerve stimulators).  Call the office for questions about other devices. Avoid electrical appliances that are in poor condition or are not properly grounded. Microwave ovens are safe to be near or to operate.  Additional information for defibrillator patients should your device go off: If your device goes off ONCE and  you feel fine afterward, notify the device clinic nurses. If your device goes off ONCE and you do not feel well afterward, call 911. If your device goes off TWICE, call 911. If your device goes off THREE times in one day, call 911.  DO NOT DRIVE YOURSELF OR A FAMILY MEMBER WITH A DEFIBRILLATOR TO THE HOSPITAL--CALL 911.

## 2022-02-12 NOTE — Discharge Summary (Cosign Needed Addendum)
ELECTROPHYSIOLOGY PROCEDURE DISCHARGE SUMMARY    Patient ID: Philip Chavez,  MRN: [id number], DOB/AGE: Jul 25, 1950 72 y.o.  Admit date: 02/11/2022 Discharge date: 02/12/2022  Primary Care Physician: Ezequiel Essex, MD  Primary Cardiologist: Dr. Aundra Dubin Electrophysiologist: Dr. Lovena Le   Primary Discharge Diagnosis:  ICD battery depletion 2.   ICD lead dislodgement  Secondary Discharge Diagnosis:  NICM Chronic CHF (systolic)  Atrial fibrillation VT  No Known Allergies   Procedures This Admission:  02/11/22: ICD lead extraction with new lead implant and generator change Conclusion: Successful biventricular ICD insertion with a new left bundle area pacing lead, a extracted left ventricular pacing lead, and removal of the previously implanted biventricular ICD which had reached elective replacement.  CXR on 02/12/21 demonstrated no pneumothorax status post device implantation.   Brief HPI: Philip Chavez is a 72 y.o. male was referred to electrophysiology in the outpatient setting for consideration of ICD implantation.  Past medical history includes above.  He had a known dislodged LV lead that historically he had not wanted revised, though with ERI reached, planned for extraction of the LV lead with new lead insertion and generator change procedure  Hospital Course:  The patient was admitted and underwent the planned procedure, full details as outlined in his procedure report. He was monitored on telemetry overnight which demonstrated SR/VP and AV pacing.  Left chest was without hematoma or ecchymosis.  The device was interrogated and found to be functioning normally.  CXR was obtained and demonstrated no pneumothorax status post device implantation.  Wound care, arm mobility, and restrictions were reviewed with the patient.  The patient feels well, denies any CP or SOB, minimal site discomfort, he was examined by Dr. Lovena Le and considered stable for discharge to home.   Resume  Eliquis Sunday 02/17/22  Early appointment is in place for Thursday to remove his pressure dressing  Physical Exam: Vitals:   02/12/22 0350 02/12/22 0355 02/12/22 0358 02/12/22 0813  BP: (!) 82/52 (!) 93/56  110/68  Pulse: (!) 51 82  73  Resp: '14 19  18  '$ Temp:  98.9 F (37.2 C)  98.5 F (36.9 C)  TempSrc:  Oral  Oral  SpO2: 98% 99% 99%   Weight:      Height:        GEN- The patient is well appearing, alert and oriented x 3 today.   HEENT: normocephalic, atraumatic; sclera clear, conjunctiva pink; hearing intact; oropharynx clear Lungs-  CTA b/l, normal work of breathing.  No wheezes, rales, rhonchi Heart-  RRR, no murmurs, rubs or gallops, PMI not laterally displaced GI- soft, non-tender, non-distended Extremities- no clubbing, cyanosis, or edema MS- no significant deformity or atrophy Skin- warm and dry, no rash or lesion, left remains with bulky pressure dressing, no evidence of bleeding Psych- euthymic mood, full affect Neuro- no gross defecits  Labs:   Lab Results  Component Value Date   WBC 6.8 02/12/2022   HGB 12.7 (L) 02/12/2022   HCT 38.6 (L) 02/12/2022   MCV 94.4 02/12/2022   PLT 142 (L) 02/12/2022    Recent Labs  Lab 02/12/22 0238  NA 135  K 4.1  CL 102  CO2 24  BUN 14  CREATININE 1.39*  CALCIUM 8.9  GLUCOSE 166*    Discharge Medications:  Allergies as of 02/12/2022   No Known Allergies      Medication List     TAKE these medications    acetaminophen 325 MG tablet Commonly known as:  TYLENOL Take 650 mg by mouth every 6 (six) hours as needed for moderate pain.   amiodarone 200 MG tablet Commonly known as: PACERONE Take 1 tablet (200 mg total) by mouth 2 (two) times daily. What changed: how much to take   atorvastatin 20 MG tablet Commonly known as: LIPITOR Take 1 tablet (20 mg total) by mouth daily.   bisoprolol 5 MG tablet Commonly known as: ZEBETA Take 1/2 tablet (2.5 mg total) by mouth daily.   CertaVite/Antioxidants Tabs Take  1 tablet by mouth daily with breakfast.   digoxin 0.125 MG tablet Commonly known as: LANOXIN Take 1/2 tablet (0.0625 mg total) by mouth daily.   Eliquis 5 MG Tabs tablet Generic drug: apixaban Take 1 tablet (5 mg total) by mouth 2 (two) times daily. Notes to patient: Do NOT resume until Sunday 02/17/22 morning dose   Entresto 24-26 MG Generic drug: sacubitril-valsartan Take 1 tablet by mouth 2 (two) times daily.   Farxiga 10 MG Tabs tablet Generic drug: dapagliflozin propanediol Take 1 tablet by mouth daily. Absolute last refill please call [phone] to schedule follow up   oxymetazoline 0.05 % nasal spray Commonly known as: AFRIN Place 1 spray into both nostrils 2 (two) times daily as needed for congestion.   potassium chloride 10 MEQ tablet Commonly known as: KLOR-CON TAKE 1 TABLET BY MOUTH 2 TIMES DAILY.   spironolactone 25 MG tablet Commonly known as: ALDACTONE TAKE 1 TABLET BY MOUTH ONCE A DAY   torsemide 20 MG tablet Commonly known as: DEMADEX Take 1 tablet (20 mg total) by mouth daily.   Voltaren 1 % Gel Generic drug: diclofenac Sodium Apply 1 Application topically 4 (four) times daily as needed (pain).        Disposition: Home Discharge Instructions     Diet - low sodium heart healthy   Complete by: As directed    Increase activity slowly   Complete by: As directed        Follow-up Information     Shirley Friar, PA-C Follow up.   Specialty: Cardiology Why: 02/14/22 @ 10:00AM to remove dressing Contact information: [place] [id number] [phone]                 Duration of Discharge Encounter: Greater than 30 minutes including physician time.  Venetia Night, PA-C 02/12/2022 10:51 AM  EP Attending  Patient seen and examined. Agree with above. Interrogation of his biv ICD under my direction demonstates normal Biv ICD function. He will be discharged home. I asked him to hold his eliquis.  We will remove his pressure dressing in a couple of days.  Carleene Overlie Madisson Kulaga,MD

## 2022-02-12 NOTE — Care Management (Signed)
  Transition of Care [facility]) Screening Note   Patient Details  Name: Philip Chavez Date of Birth: 12-Dec-1950   [facility] of [facility]) CM/SW Contact:    Bethena Roys, RN Phone Number: 02/12/2022, 11:14 AM    Transition of Care Department [facility]) has reviewed the patient and no TOC needs have been identified at this time. Patient states he is independent from home and will not need any HH Services at this time. Patient states he will call a cab for transport home once staff RN goes over discharge instructions. No home or DME needs identified.

## 2022-02-13 ENCOUNTER — Telehealth: Payer: Self-pay

## 2022-02-13 LAB — BPAM RBC
Blood Product Expiration Date: 202401132359
Blood Product Expiration Date: 202401152359
ISSUE DATE / TIME: 202401081514
ISSUE DATE / TIME: 202401081514
Unit Type and Rh: 5100
Unit Type and Rh: 5100

## 2022-02-13 LAB — TYPE AND SCREEN
ABO/RH(D): O POS
Antibody Screen: NEGATIVE
Unit division: 0
Unit division: 0

## 2022-02-13 NOTE — Telephone Encounter (Signed)
LMOVM for patient to send missed ICM transmission. 

## 2022-02-13 NOTE — Anesthesia Postprocedure Evaluation (Signed)
Anesthesia Post Note  Patient: Philip Chavez  Procedure(s) Performed: LEAD EXTRACTION BIV ICD [place]     Patient location during evaluation: PACU Anesthesia Type: General Level of consciousness: awake and alert Pain management: pain level controlled Vital Signs Assessment: post-procedure vital signs reviewed and stable Respiratory status: spontaneous breathing, nonlabored ventilation, respiratory function stable and patient connected to nasal cannula oxygen Cardiovascular status: blood pressure returned to baseline and stable Postop Assessment: no apparent nausea or vomiting Anesthetic complications: no   No notable events documented.  Last Vitals:  Vitals:   02/12/22 0813 02/12/22 1053  BP: 110/68 (!) 109/53  Pulse: 73   Resp: 18   Temp: 36.9 C   SpO2:      Last Pain:  Vitals:   02/12/22 0820  TempSrc:   PainSc: 0-No pain                 Dimonique Bourdeau S

## 2022-02-14 ENCOUNTER — Other Ambulatory Visit (HOSPITAL_COMMUNITY): Payer: Self-pay

## 2022-02-14 ENCOUNTER — Ambulatory Visit: Payer: Medicare PPO | Attending: Student | Admitting: Student

## 2022-02-14 DIAGNOSIS — Z95810 Presence of automatic (implantable) cardiac defibrillator: Secondary | ICD-10-CM

## 2022-02-14 DIAGNOSIS — I5022 Chronic systolic (congestive) heart failure: Secondary | ICD-10-CM

## 2022-02-14 NOTE — Progress Notes (Signed)
In office wound check for pressure dressing.   Pt feeling well. Denies drainage or bleeding. Pain well controlled.   Pressure dressing remove. Site well healing with steri-strips in place. No hematoma noted as below.   Resume Eliquis 1/14 as previously discussed.   Ok to shower after 1/15. Reviewed wound care and restrictions.   Legrand Como [address]" [place], PA-C  02/14/2022 10:32 AM

## 2022-02-14 NOTE — Progress Notes (Signed)
ICM remote transmission rescheduled for 04/22/2022 to allow new ICD baseline fluid level to develop after device change.

## 2022-02-15 ENCOUNTER — Other Ambulatory Visit (HOSPITAL_COMMUNITY): Payer: Self-pay

## 2022-02-18 ENCOUNTER — Telehealth: Payer: Self-pay

## 2022-02-18 ENCOUNTER — Telehealth: Payer: Self-pay | Admitting: Internal Medicine

## 2022-02-18 NOTE — Telephone Encounter (Signed)
Patient stated he has concern regarding his heart and bowel.  Patient states this is urgent but would not give further details.

## 2022-02-18 NOTE — Telephone Encounter (Signed)
Called patient back per message received from [facility] triage.    Pt stated he has not had a BM in a week.  Pt states he has been drinking enough fluids, and has taken 2 laxative tablets, and epsom salt tablets.  Pt encouraged to drink more fluids, eat some bran, and drink some decaf coffee to get his lower GI to move.    Pt did not seem receptive to home remedies, so due to Dr. Lovena Le not being available until the end of the week, Pt encouraged to contact his PCP, Dr. Jeani Hawking, and make them aware of his concern.  Pt made aware of colace, and encourage to approach his PCP for advisement.  Pt also told to call [facility] back if PCP is unable to help Pt solve this problem.

## 2022-02-18 NOTE — Telephone Encounter (Signed)
Pt called [facility] Triage, regarding a concern.  Pt called back and discussed POC with his lower GI concerns.  Pt given advice, and recommended reach out to his PCP, since Dr. Lovena Le unavailable today.  Pt advised to call back, if PCP unable to help.  See prior RN note.

## 2022-02-20 NOTE — Progress Notes (Addendum)
Cardiology Office Note Date:  02/20/2022  Patient ID:  Philip Chavez, Philip Chavez 10-09-1950, MRN [id number] PCP:  Ezequiel Essex, MD  Cardiologist:  Dr. Aundra Dubin  Electrophysiologist: Dr. Lovena Le    Chief Complaint:  wound check  History of Present Illness: Philip Chavez is a 72 y.o. male with history of CHB, NICM, chronic CHF, AFlutter (ablated), AFib, VT, CKD (III), DVT/PE He is followed by HF team and EP, known to have had his LV lead dislodged though the pt had not wanted to undergo revision.  With worsening HF revisited device options and planned for LV lead extraction with new lead implant/gen change which was done on 02/11/22.  Planned to resume eliquis 1/14  He saw Jonni Sanger 02/14/21 for removal of his pressure dressing.  TODAY He is doing well Has resumed his Eliquis. Initially had some swelling but is improved, certainly better No CP, palp No syncope, near syncope No bleeding or signs of bleeding   Device information MDT dual chamber PPM implanted 03/15/2013 >>> upgrade to CRT-D 10/24/2014  (RV pacing lead was extracted) >>>> LV lead extraction, implanted LB area pacing lead and generator change 02/11/22 + appropriate therapy/VT storm Nov 2023  AAD hx Tikosyn >> stopped with development of VT Amiodarone started 10/30, 2023   Past Medical History:  Diagnosis Date   AICD (automatic cardioverter/defibrillator) present 0919/2016   BIV    Annual physical exam 06/09/2019   Atrial flutter (Standard)    a. 08/2014 presented w/ aflutter->converted on amio;  b. CHA2DS2VASc = 2 -->Xarelto.   Atrial tachycardia    Chronic combined systolic and diastolic CHF (congestive heart failure) ([place])    a. 03/2013 Echo EF 50-55%;  b. 07/2013 Echo EF 20-25%, diff HK, Gr3 DD;  c. 08/2014 Echo: EF 15%, diff HK, mild AI/MR, mildly dil LA/RV, mod TR, PASP 63mHg.   DVT (deep venous thrombosis) ([facility] [place]    a. 2007 RLE: S/P ankle surgery;  b. 03/2013 LLE DVT & PE-->Xarelto.   Embolism, pulmonary with infarction  ([facility]    a. 2007 RLE: S/P ankle surgery;  b. 03/2013 LLE DVT & PE-->Xarelto.   Hypercholesterolemia    Hypercoagulable state ([place]    Hypertension    Musculoskeletal neck pain    Nonischemic cardiomyopathy ([place]    a. 07/2013 Echo: EF 20-25%;  b. 07/2013 Myoview: Large inferior, lateral, apical scar w/ HK, no ischemia, EF 17%;  b. 08/2014 Echo: EF 15%, diff HK.   Pneumonia ~ 04/2014; 09/02/2014   Presence of permanent cardiac pacemaker    a. 03/2013 s/p MDT ADDRL1 Adapta DC PPM, ser # [id number].   Sleep apnea    does not wear mask (09/02/2014)   Third degree heart block ([facility]    a. 03/2013 s/p MDT ADDRL1 Adapta DC PPM, ser # [id number].    Past Surgical History:  Procedure Laterality Date   BIV ICD GENERATOR CHANGEOUT N/A 02/11/2022   Procedure: BIV ICD GENERATOR CHANGEOUT;  Surgeon: TEvans Lance MD;  [facility];  Service: Cardiovascular;  Laterality: N/A;   CARDIAC CATHETERIZATION N/A 10/17/2014   Procedure: Right/Left Heart Cath and Coronary Angiography;  Surgeon: DLarey Dresser MD;  [place] [facility];  Service: Cardiovascular;  Laterality: N/A;   CARDIOVERSION N/A 07/26/2020   Procedure: CARDIOVERSION;  Surgeon: CBuford Dresser MD;  [facility];  Service: Cardiovascular;  Laterality: N/A;   COLONOSCOPY WITH PROPOFOL N/A 11/28/2021   Procedure: COLONOSCOPY WITH PROPOFOL;  Surgeon: SWilford Corner MD;  [facility];  Service: Gastroenterology;  Laterality: N/A;   ELECTROPHYSIOLOGIC STUDY N/A 09/07/2014   Procedure: A-Flutter;  Surgeon: Evans Lance, MD;  Location: [place] [facility];  Service: Cardiovascular;  Laterality: N/A;   EP IMPLANTABLE DEVICE  10/24/2014   BIV   EP IMPLANTABLE DEVICE N/A 10/24/2014   Procedure: BiV ICD Upgrade;  Surgeon: Evans Lance, MD;  Location: [place] [facility] LAB;  Service: Cardiovascular;  Laterality: N/A;   FOOT FRACTURE SURGERY Right 2007   FRACTURE SURGERY     INGUINAL HERNIA REPAIR Right  1980's   INGUINAL HERNIA REPAIR Right 09/22/2020   Procedure: LAPAROSCOPIC RIGHT INGUINAL HERNIA REPAIR WITH MESH;  Surgeon: Ralene Ok, MD;  Location: [place];  Service: General;  Laterality: Right;   INSERT / REPLACE / REMOVE PACEMAKER     MDT ADDRL1 pacemaker implanted by Dr Lovena Le for complete heart block   LEAD EXTRACTION N/A 02/11/2022   Procedure: LEAD EXTRACTION;  Surgeon: Evans Lance, MD;  Location: [place] [facility];  Service: Cardiovascular;  Laterality: N/A;   PERMANENT PACEMAKER INSERTION N/A 03/15/2013   Procedure: PERMANENT PACEMAKER INSERTION;  Surgeon: Evans Lance, MD;  [facility];  Service: Cardiovascular;  Laterality: N/A;   POLYPECTOMY  11/28/2021   Procedure: POLYPECTOMY;  Surgeon: Wilford Corner, MD;  Location: [facility];  Service: Gastroenterology;;   RIGHT HEART CATH N/A 12/05/2021   Procedure: RIGHT HEART CATH;  Surgeon: Larey Dresser, MD;  Location: [place] CV LAB;  Service: Cardiovascular;  Laterality: N/A;   SCLEROTHERAPY  11/28/2021   Procedure: SCLEROTHERAPY;  Surgeon: Wilford Corner, MD;  Location: [facility];  Service: Gastroenterology;;   SUBMUCOSAL TATTOO INJECTION  11/28/2021   Procedure: SUBMUCOSAL TATTOO INJECTION;  Surgeon: Wilford Corner, MD;  Location: [facility];  Service: Gastroenterology;;    Current Outpatient Medications  Medication Sig Dispense Refill   acetaminophen (TYLENOL) 325 MG tablet Take 650 mg by mouth every 6 (six) hours as needed for moderate pain.     amiodarone (PACERONE) 200 MG tablet Take 1 tablet (200 mg total) by mouth 2 (two) times daily. 60 tablet 5   apixaban (ELIQUIS) 5 MG TABS tablet Take 1 tablet (5 mg total) by mouth 2 (two) times daily. 180 tablet 3   atorvastatin (LIPITOR) 20 MG tablet Take 1 tablet (20 mg total) by mouth daily. 30 tablet 3   bisoprolol (ZEBETA) 5 MG tablet Take 1/2 tablet (2.5 mg total) by mouth daily. 30 tablet 6   dapagliflozin propanediol (FARXIGA) 10 MG TABS  tablet Take 1 tablet by mouth daily. Absolute last refill please call [phone] to schedule follow up (Patient not taking: Reported on 02/06/2022) 30 tablet 11   diclofenac Sodium (VOLTAREN) 1 % GEL Apply 1 Application topically 4 (four) times daily as needed (pain).     digoxin (LANOXIN) 0.125 MG tablet Take 1/2 tablet (0.0625 mg total) by mouth daily. 15 tablet 6   Multiple Vitamins-Minerals (CERTAVITE/ANTIOXIDANTS) TABS Take 1 tablet by mouth daily with breakfast.     oxymetazoline (AFRIN) 0.05 % nasal spray Place 1 spray into both nostrils 2 (two) times daily as needed for congestion.     potassium chloride (KLOR-CON) 10 MEQ tablet TAKE 1 TABLET BY MOUTH 2 TIMES DAILY. 180 tablet 2   sacubitril-valsartan (ENTRESTO) 24-26 MG Take 1 tablet by mouth 2 (two) times daily. 60 tablet 11   spironolactone (ALDACTONE) 25 MG tablet TAKE 1 TABLET BY MOUTH ONCE A DAY 90 tablet 3   torsemide (DEMADEX)  20 MG tablet Take 1 tablet (20 mg total) by mouth daily. 30 tablet 6   No current facility-administered medications for this visit.    Allergies:   Patient has no known allergies.   Social History:  The patient  reports that he quit smoking about 40 years ago. His smoking use included cigarettes. He has a 15.00 pack-year smoking history. He has been exposed to tobacco smoke. He has never used smokeless tobacco. He reports that he does not drink alcohol and does not use drugs.   Family History:  The patient's family history includes Arrhythmia in his father and sister; Diabetes type II in his mother; Heart disease in his father.  ROS:  Please see the history of present illness.    All other systems are reviewed and otherwise negative.   PHYSICAL EXAM:  VS:  There were no vitals taken for this visit. BMI: There is no height or weight on file to calculate BMI. Well nourished, well developed, in no acute distress HEENT: normocephalic, atraumatic Neck: no JVD, carotid bruits or masses Cardiac:  RRR; no  significant murmurs, no rubs, or gallops Lungs:  CTA b/l, no wheezing, rhonchi or rales Abd: soft, nontender MS: no deformity or  atrophy Ext:  no edema Skin: warm and dry, no rash Neuro:  No gross deficits appreciated Psych: euthymic mood, full affect  ICD site steri strips are removed without difficulty Wound edges are well approximated No erythema, edema, fluctuation or heat  EKG:  not done today  Device interrogation done today and reviewed by myself:  Battery and lead measurements are good LV lead acute implant output remains 3 NSVT AF burden 13.1%, longest 4 hours VP (LB area lead) 98.6% effective   12/03/21: TTE 1. Left ventricular ejection fraction, by estimation, is 15%. The left  ventricle has severely decreased function. The left ventricle demonstrates  global hypokinesis. The left ventricular internal cavity size was severely  dilated. Left ventricular  diastolic parameters are indeterminate.   2. Right ventricular systolic function is mildly reduced. The right  ventricular size is mildly enlarged.   3. Left atrial size was mildly dilated.   4. The mitral valve is grossly normal. Mild mitral valve regurgitation.  No evidence of mitral stenosis.   5. The aortic valve is tricuspid. Aortic valve regurgitation is mild. No  aortic stenosis is present.   6. Aortic dilatation noted. There is mild dilatation of the aortic root,  measuring 41 mm.   Conclusion(s)/Recommendation(s): No left ventricular mural or apical  thrombus/thrombi, swirling contrast is seen.   09/07/2014: EPS/ablation CONCLUSIONS:  1. Isthmus-dependent right atrial flutter on prior ECG.  2. Successful radiofrequency ablation of atrial flutter along the cavotricuspid isthmus with complete bidirectional isthmus block achieved.  3. No inducible arrhythmias following ablation.  4. No early apparent complications.   Recent Labs: 12/03/2021: ALT 20 12/06/2021: Magnesium 2.2 01/17/2022: B Natriuretic  Peptide 524.6 02/12/2022: BUN 14; Creatinine, Ser 1.39; Hemoglobin 12.7; Platelets 142; Potassium 4.1; Sodium 135  No results found for requested labs within last 365 days.   Estimated Creatinine Clearance: 55.1 mL/min (A) (by C-G formula based on SCr of 1.39 mg/dL (H)).   Wt Readings from Last 3 Encounters:  02/07/22 196 lb (88.9 kg)  01/17/22 193 lb (87.5 kg)  01/03/22 187 lb 9.6 oz (85.1 kg)     Other studies reviewed: Additional studies/records reviewed today include: summarized above  ASSESSMENT AND PLAN:  ICD Well healed, no signs of infection Programmed nonadaptive LV pacing  only (LB area lead placement)  Paroxysmal Afib CHA2DS2Vasc is 6, on Eliquis, appropriately dosed 13% burden PE/DVT hx Secondary hypercoagulable state  VT Amiodarone Will reduce dose to '200mg'$  daily  Amio labs today   NICM Chronic CHF No symptoms or exam findings of volume OL No true HF data available yet via the device    Disposition: F/u with as scheduled, sooner if needed  Current medicines are reviewed at length with the patient today.  The patient did not have any concerns regarding medicines.  Venetia Night, PA-C 02/20/2022 9:04 AM     [facility] [address] [place] [id number] [phone] (office)  [phone] (fax)

## 2022-02-21 ENCOUNTER — Ambulatory Visit: Payer: Medicare PPO | Attending: Internal Medicine

## 2022-02-22 ENCOUNTER — Other Ambulatory Visit (HOSPITAL_COMMUNITY): Payer: Self-pay

## 2022-02-22 ENCOUNTER — Ambulatory Visit: Payer: Medicare PPO | Attending: Physician Assistant | Admitting: Physician Assistant

## 2022-02-22 ENCOUNTER — Encounter: Payer: Self-pay | Admitting: Physician Assistant

## 2022-02-22 VITALS — BP 100/60 | HR 78 | Ht 73.0 in | Wt 193.8 lb

## 2022-02-22 DIAGNOSIS — I472 Ventricular tachycardia, unspecified: Secondary | ICD-10-CM

## 2022-02-22 DIAGNOSIS — I428 Other cardiomyopathies: Secondary | ICD-10-CM | POA: Diagnosis not present

## 2022-02-22 DIAGNOSIS — Z5189 Encounter for other specified aftercare: Secondary | ICD-10-CM

## 2022-02-22 DIAGNOSIS — I480 Paroxysmal atrial fibrillation: Secondary | ICD-10-CM | POA: Diagnosis not present

## 2022-02-22 DIAGNOSIS — Z95810 Presence of automatic (implantable) cardiac defibrillator: Secondary | ICD-10-CM | POA: Diagnosis not present

## 2022-02-22 DIAGNOSIS — I5022 Chronic systolic (congestive) heart failure: Secondary | ICD-10-CM | POA: Diagnosis not present

## 2022-02-22 DIAGNOSIS — Z79899 Other long term (current) drug therapy: Secondary | ICD-10-CM | POA: Diagnosis not present

## 2022-02-22 LAB — CUP PACEART INCLINIC DEVICE CHECK
Date Time Interrogation Session: 20240119160229
Implantable Lead Connection Status: 753985
Implantable Lead Connection Status: 753985
Implantable Lead Connection Status: 753985
Implantable Lead Implant Date: 20150209
Implantable Lead Implant Date: 20160919
Implantable Lead Implant Date: 20160919
Implantable Lead Location: 753858
Implantable Lead Location: 753859
Implantable Lead Location: 753860
Implantable Lead Model: 4598
Implantable Lead Model: 5076
Implantable Lead Model: 6935
Implantable Pulse Generator Implant Date: 20160919
Lead Channel Pacing Threshold Amplitude: 0.5 V
Lead Channel Pacing Threshold Amplitude: 0.75 V
Lead Channel Pacing Threshold Amplitude: 0.75 V
Lead Channel Pacing Threshold Pulse Width: 0.4 ms
Lead Channel Pacing Threshold Pulse Width: 0.4 ms
Lead Channel Pacing Threshold Pulse Width: 0.4 ms
Lead Channel Sensing Intrinsic Amplitude: 13.3 mV
Lead Channel Sensing Intrinsic Amplitude: 2.6 mV

## 2022-02-22 MED ORDER — AMIODARONE HCL 200 MG PO TABS
200.0000 mg | ORAL_TABLET | Freq: Every day | ORAL | 3 refills | Status: DC
  Filled 2022-02-22 – 2022-04-01 (×2): qty 90, 90d supply, fill #0
  Filled 2022-07-01: qty 90, 90d supply, fill #1
  Filled 2022-09-27: qty 90, 90d supply, fill #2

## 2022-02-22 NOTE — Patient Instructions (Addendum)
Medication Instructions:    START TAKING:  AMIODARONE 200 MG ONCE A DAY   *If you need a refill on your cardiac medications before your next appointment, please call your pharmacy*   Lab Work: LFT AND TSH TODAY    If you have labs (blood work) drawn today and your tests are completely normal, you will receive your results only by: [place] (if you have MyChart) OR A paper copy in the mail If you have any lab test that is abnormal or we need to change your treatment, we will call you to review the results.   Testing/Procedures: NONE ORDERED  TODAY      Follow-Up: At [facility], you and your health needs are our priority.  As part of our continuing mission to provide you with exceptional heart care, we have created designated Provider Care Teams.  These Care Teams include your primary Cardiologist (physician) and Advanced Practice Providers (APPs -  Physician Assistants and Nurse Practitioners) who all work together to provide you with the care you need, when you need it.  We recommend signing up for the patient portal called "MyChart".  Sign up information is provided on this After Visit Summary.  MyChart is used to connect with patients for Virtual Visits (Telemedicine).  Patients are able to view lab/test results, encounter notes, upcoming appointments, etc.  Non-urgent messages can be sent to your provider as well.   To learn more about what you can do with MyChart, go to [email or web address].    Your next appointment:  AS SCHEDULED   Other Instructions

## 2022-02-23 ENCOUNTER — Other Ambulatory Visit (HOSPITAL_COMMUNITY): Payer: Self-pay

## 2022-02-23 LAB — HEPATIC FUNCTION PANEL
ALT: 19 IU/L (ref 0–44)
AST: 18 IU/L (ref 0–40)
Albumin: 4.3 g/dL (ref 3.8–4.8)
Alkaline Phosphatase: 91 IU/L (ref 44–121)
Bilirubin Total: 0.6 mg/dL (ref 0.0–1.2)
Bilirubin, Direct: 0.15 mg/dL (ref 0.00–0.40)
Total Protein: 7.2 g/dL (ref 6.0–8.5)

## 2022-02-23 LAB — TSH: TSH: 0.944 u[IU]/mL (ref 0.450–4.500)

## 2022-02-25 ENCOUNTER — Encounter (HOSPITAL_COMMUNITY): Payer: Medicare PPO

## 2022-02-25 NOTE — Addendum Note (Signed)
Addended by: Douglass Rivers D on: 02/25/2022 11:50 AM   Modules accepted: Level of Service

## 2022-02-25 NOTE — Progress Notes (Signed)
Remote ICD transmission.   

## 2022-02-26 ENCOUNTER — Other Ambulatory Visit (HOSPITAL_COMMUNITY): Payer: Self-pay | Admitting: Cardiology

## 2022-02-27 ENCOUNTER — Encounter (HOSPITAL_COMMUNITY): Payer: Medicare PPO

## 2022-03-01 DIAGNOSIS — I2510 Atherosclerotic heart disease of native coronary artery without angina pectoris: Secondary | ICD-10-CM | POA: Diagnosis not present

## 2022-03-01 DIAGNOSIS — I4891 Unspecified atrial fibrillation: Secondary | ICD-10-CM | POA: Diagnosis not present

## 2022-03-01 DIAGNOSIS — I130 Hypertensive heart and chronic kidney disease with heart failure and stage 1 through stage 4 chronic kidney disease, or unspecified chronic kidney disease: Secondary | ICD-10-CM | POA: Diagnosis not present

## 2022-03-01 DIAGNOSIS — D6869 Other thrombophilia: Secondary | ICD-10-CM | POA: Diagnosis not present

## 2022-03-01 DIAGNOSIS — I2782 Chronic pulmonary embolism: Secondary | ICD-10-CM | POA: Diagnosis not present

## 2022-03-01 DIAGNOSIS — M1990 Unspecified osteoarthritis, unspecified site: Secondary | ICD-10-CM | POA: Diagnosis not present

## 2022-03-01 DIAGNOSIS — E785 Hyperlipidemia, unspecified: Secondary | ICD-10-CM | POA: Diagnosis not present

## 2022-03-01 DIAGNOSIS — N1832 Chronic kidney disease, stage 3b: Secondary | ICD-10-CM | POA: Diagnosis not present

## 2022-03-01 DIAGNOSIS — F1021 Alcohol dependence, in remission: Secondary | ICD-10-CM | POA: Diagnosis not present

## 2022-03-04 ENCOUNTER — Encounter (HOSPITAL_COMMUNITY): Payer: Medicare PPO

## 2022-03-08 ENCOUNTER — Encounter (HOSPITAL_COMMUNITY): Payer: Medicare PPO

## 2022-03-11 ENCOUNTER — Other Ambulatory Visit (HOSPITAL_COMMUNITY): Payer: Self-pay

## 2022-03-11 ENCOUNTER — Ambulatory Visit (HOSPITAL_COMMUNITY): Payer: Medicare PPO | Attending: Cardiology

## 2022-03-11 DIAGNOSIS — I4892 Unspecified atrial flutter: Secondary | ICD-10-CM | POA: Insufficient documentation

## 2022-03-11 DIAGNOSIS — Z8679 Personal history of other diseases of the circulatory system: Secondary | ICD-10-CM | POA: Diagnosis not present

## 2022-03-11 DIAGNOSIS — Z86718 Personal history of other venous thrombosis and embolism: Secondary | ICD-10-CM | POA: Diagnosis not present

## 2022-03-11 DIAGNOSIS — Z79899 Other long term (current) drug therapy: Secondary | ICD-10-CM | POA: Insufficient documentation

## 2022-03-11 DIAGNOSIS — I5032 Chronic diastolic (congestive) heart failure: Secondary | ICD-10-CM | POA: Diagnosis not present

## 2022-03-11 DIAGNOSIS — Z7901 Long term (current) use of anticoagulants: Secondary | ICD-10-CM | POA: Diagnosis not present

## 2022-03-11 DIAGNOSIS — Z87891 Personal history of nicotine dependence: Secondary | ICD-10-CM | POA: Insufficient documentation

## 2022-03-11 DIAGNOSIS — G4733 Obstructive sleep apnea (adult) (pediatric): Secondary | ICD-10-CM | POA: Insufficient documentation

## 2022-03-11 DIAGNOSIS — I5022 Chronic systolic (congestive) heart failure: Secondary | ICD-10-CM | POA: Diagnosis not present

## 2022-03-11 DIAGNOSIS — Z95810 Presence of automatic (implantable) cardiac defibrillator: Secondary | ICD-10-CM | POA: Diagnosis not present

## 2022-03-11 DIAGNOSIS — I480 Paroxysmal atrial fibrillation: Secondary | ICD-10-CM | POA: Insufficient documentation

## 2022-03-11 DIAGNOSIS — N189 Chronic kidney disease, unspecified: Secondary | ICD-10-CM | POA: Insufficient documentation

## 2022-03-11 DIAGNOSIS — I130 Hypertensive heart and chronic kidney disease with heart failure and stage 1 through stage 4 chronic kidney disease, or unspecified chronic kidney disease: Secondary | ICD-10-CM | POA: Diagnosis not present

## 2022-03-14 ENCOUNTER — Other Ambulatory Visit (HOSPITAL_COMMUNITY): Payer: Self-pay

## 2022-03-15 ENCOUNTER — Encounter: Payer: Self-pay | Admitting: Cardiology

## 2022-03-25 ENCOUNTER — Ambulatory Visit: Payer: Medicare PPO

## 2022-03-29 ENCOUNTER — Telehealth (HOSPITAL_COMMUNITY): Payer: Self-pay

## 2022-03-29 NOTE — Telephone Encounter (Signed)
Philip Chavez called and asked if he could switch his torsemide to every other day instead of every day.  He states he cannot control his urine flow, going every 20 minutes or so and his urine is dark in color. He states he is drinking water and is thirsty. No pain noted and no other symptoms. Please advise.

## 2022-03-29 NOTE — Telephone Encounter (Signed)
Spoke to patient and indication medication change is ok.

## 2022-04-01 ENCOUNTER — Ambulatory Visit: Payer: Medicare PPO | Attending: Internal Medicine

## 2022-04-01 ENCOUNTER — Ambulatory Visit (HOSPITAL_COMMUNITY)
Admission: RE | Admit: 2022-04-01 | Discharge: 2022-04-01 | Disposition: A | Discharge status: Home / Self Care | Payer: Medicare PPO | Source: Ambulatory Visit | Attending: Cardiology | Admitting: Cardiology

## 2022-04-01 ENCOUNTER — Encounter (HOSPITAL_COMMUNITY): Payer: Self-pay | Admitting: Cardiology

## 2022-04-01 ENCOUNTER — Other Ambulatory Visit (HOSPITAL_COMMUNITY): Payer: Self-pay

## 2022-04-01 VITALS — BP 102/66 | HR 78 | Wt 195.2 lb

## 2022-04-01 DIAGNOSIS — Z95810 Presence of automatic (implantable) cardiac defibrillator: Secondary | ICD-10-CM

## 2022-04-01 DIAGNOSIS — E785 Hyperlipidemia, unspecified: Secondary | ICD-10-CM | POA: Diagnosis not present

## 2022-04-01 DIAGNOSIS — I5022 Chronic systolic (congestive) heart failure: Secondary | ICD-10-CM

## 2022-04-01 DIAGNOSIS — I428 Other cardiomyopathies: Secondary | ICD-10-CM | POA: Insufficient documentation

## 2022-04-01 DIAGNOSIS — N1831 Chronic kidney disease, stage 3a: Secondary | ICD-10-CM | POA: Insufficient documentation

## 2022-04-01 DIAGNOSIS — I2510 Atherosclerotic heart disease of native coronary artery without angina pectoris: Secondary | ICD-10-CM | POA: Insufficient documentation

## 2022-04-01 DIAGNOSIS — Z86718 Personal history of other venous thrombosis and embolism: Secondary | ICD-10-CM | POA: Insufficient documentation

## 2022-04-01 DIAGNOSIS — I4892 Unspecified atrial flutter: Secondary | ICD-10-CM | POA: Diagnosis not present

## 2022-04-01 DIAGNOSIS — Z7901 Long term (current) use of anticoagulants: Secondary | ICD-10-CM | POA: Diagnosis not present

## 2022-04-01 DIAGNOSIS — Z79899 Other long term (current) drug therapy: Secondary | ICD-10-CM | POA: Diagnosis not present

## 2022-04-01 DIAGNOSIS — Z8679 Personal history of other diseases of the circulatory system: Secondary | ICD-10-CM | POA: Insufficient documentation

## 2022-04-01 DIAGNOSIS — Z8249 Family history of ischemic heart disease and other diseases of the circulatory system: Secondary | ICD-10-CM | POA: Diagnosis not present

## 2022-04-01 DIAGNOSIS — I480 Paroxysmal atrial fibrillation: Secondary | ICD-10-CM | POA: Diagnosis not present

## 2022-04-01 LAB — BASIC METABOLIC PANEL
Anion gap: 8 (ref 5–15)
BUN: 17 mg/dL (ref 8–23)
CO2: 25 mmol/L (ref 22–32)
Calcium: 9.1 mg/dL (ref 8.9–10.3)
Chloride: 103 mmol/L (ref 98–111)
Creatinine, Ser: 1.36 mg/dL — ABNORMAL HIGH (ref 0.61–1.24)
GFR, Estimated: 56 mL/min — ABNORMAL LOW (ref >60)
Glucose, Bld: 165 mg/dL — ABNORMAL HIGH (ref 70–99)
Potassium: 4.2 mmol/L (ref 3.5–5.1)
Sodium: 136 mmol/L (ref 135–145)

## 2022-04-01 LAB — DIGOXIN LEVEL: Digoxin Level: 0.3 ng/mL — ABNORMAL LOW (ref 0.8–2.0)

## 2022-04-01 MED ORDER — DAPAGLIFLOZIN PROPANEDIOL 10 MG PO TABS
10.0000 mg | ORAL_TABLET | Freq: Every day | ORAL | 11 refills | Status: DC
  Filled 2022-04-01: qty 30, 30d supply, fill #0
  Filled 2022-04-30: qty 30, 30d supply, fill #1
  Filled 2022-05-31 – 2022-08-13 (×2): qty 30, 30d supply, fill #2
  Filled 2022-09-10: qty 30, 30d supply, fill #3
  Filled 2022-10-13 – 2022-10-14 (×2): qty 30, 30d supply, fill #4
  Filled 2022-11-10: qty 30, 30d supply, fill #5
  Filled 2022-12-10: qty 30, 30d supply, fill #6
  Filled 2023-01-09: qty 30, 30d supply, fill #7
  Filled 2023-02-09: qty 30, 30d supply, fill #8
  Filled 2023-03-08 (×2): qty 30, 30d supply, fill #9

## 2022-04-01 MED ORDER — BISOPROLOL FUMARATE 5 MG PO TABS
5.0000 mg | ORAL_TABLET | Freq: Every day | ORAL | 3 refills | Status: DC
  Filled 2022-04-01: qty 90, 90d supply, fill #0

## 2022-04-01 NOTE — Patient Instructions (Addendum)
INCREASE Bisoprolol to 5 mg daily.  KEEP Torsemide as PRN, for weight gain of 3lb in 24 hours or 5lb in a week.  Your physician has requested that you have an echocardiogram. Echocardiography is a painless test that uses sound waves to create images of your heart. It provides your doctor with information about the size and shape of your heart and how well your heart's chambers and valves are working. This procedure takes approximately one hour. There are no restrictions for this procedure. Please do NOT wear cologne, perfume, aftershave, or lotions (deodorant is allowed). Please arrive 15 minutes prior to your appointment time.   Your physician recommends that you schedule a follow-up appointment in: 3 months with an echocardiogram ( May ) ** please call the office in April to arrange your follow up appointment. **   If you have any questions or concerns before your next appointment please send [place] a message through [place] or call our office at [phone].    TO LEAVE A MESSAGE FOR THE NURSE SELECT OPTION 2, PLEASE LEAVE A MESSAGE INCLUDING: YOUR NAME DATE OF BIRTH CALL BACK NUMBER REASON FOR CALL**this is important as we prioritize the call backs  YOU WILL RECEIVE A CALL BACK THE SAME DAY AS LONG AS YOU CALL BEFORE 4:00 PM  At the [facility], you and your health needs are our priority. As part of our continuing mission to provide you with exceptional heart care, we have created designated Provider Care Teams. These Care Teams include your primary Cardiologist (physician) and Advanced Practice Providers (APPs- Physician Assistants and Nurse Practitioners) who all work together to provide you with the care you need, when you need it.   You may see any of the following providers on your designated Care Team at your next follow up: Dr Glori Bickers Dr Loralie Champagne Dr. Roxana Hires, NP Lyda Jester, [place] [facility] [place], [place] Forestine Na, NP Audry Riles, PharmD   Please be sure to bring in all your medications bottles to every appointment.    Thank you for choosing [facility]

## 2022-04-02 ENCOUNTER — Other Ambulatory Visit (HOSPITAL_COMMUNITY): Payer: Self-pay

## 2022-04-02 ENCOUNTER — Ambulatory Visit: Payer: Medicare PPO | Admitting: Family Medicine

## 2022-04-02 ENCOUNTER — Other Ambulatory Visit: Payer: Self-pay

## 2022-04-02 VITALS — BP 102/72 | HR 72 | Ht 73.0 in | Wt 199.8 lb

## 2022-04-02 DIAGNOSIS — R3911 Hesitancy of micturition: Secondary | ICD-10-CM | POA: Diagnosis not present

## 2022-04-02 DIAGNOSIS — N401 Enlarged prostate with lower urinary tract symptoms: Secondary | ICD-10-CM

## 2022-04-02 LAB — POCT URINALYSIS DIP (MANUAL ENTRY)
Bilirubin, UA: NEGATIVE
Glucose, UA: NEGATIVE mg/dL
Ketones, POC UA: NEGATIVE mg/dL
Nitrite, UA: POSITIVE — AB
Protein Ur, POC: 30 mg/dL — AB
Spec Grav, UA: 1.015 (ref 1.010–1.025)
Urobilinogen, UA: 1 E.U./dL
pH, UA: 6 (ref 5.0–8.0)

## 2022-04-02 LAB — POCT UA - MICROSCOPIC ONLY

## 2022-04-02 MED ORDER — TAMSULOSIN HCL 0.4 MG PO CAPS
0.4000 mg | ORAL_CAPSULE | Freq: Every day | ORAL | 2 refills | Status: DC
  Filled 2022-04-02: qty 30, 30d supply, fill #0
  Filled 2022-05-03: qty 30, 30d supply, fill #1

## 2022-04-02 NOTE — Patient Instructions (Addendum)
It was great to see you!  We will start you on a medication called Flomax to help with your urinary symptoms. This can sometimes lower your blood pressure and cause dizziness, passing out, or falls. Take your time when changing from laying to sitting to standing. Take extra caution if getting up to urinate in the night. Let me know if you're having any of these side effects.  Benign Prostatic Hyperplasia  Benign prostatic hyperplasia (BPH) is an enlarged prostate gland that is caused by the normal aging process. The prostate may get bigger as a man gets older. The condition is not caused by cancer. The prostate is a walnut-sized gland that is involved in the production of semen. It is located in front of the rectum and below the bladder. The bladder stores urine. The urethra carries stored urine out of the body. An enlarged prostate can press on the urethra. This can make it harder to pass urine. The buildup of urine in the bladder can cause infection. Back pressure and infection may progress to bladder damage and kidney (renal) failure. What are the causes? This condition is part of the normal aging process. However, not all men develop problems from this condition. If the prostate enlarges away from the urethra, urine flow will not be blocked. If it enlarges toward the urethra and compresses it, there will be problems passing urine. What increases the risk? This condition is more likely to develop in men older than 50 years. What are the signs or symptoms? Symptoms of this condition include: Getting up often during the night to urinate. Needing to urinate frequently during the day. Difficulty starting urine flow. Decrease in size and strength of your urine stream. Leaking (dribbling) after urinating. Inability to pass urine. This needs immediate treatment. Inability to completely empty your bladder. Pain when you pass urine. This is more common if there is also an infection. Urinary tract  infection (UTI). How is this diagnosed? This condition is diagnosed based on your medical history, a physical exam, and your symptoms. Tests will also be done, such as: A post-void bladder scan. This measures any amount of urine that may remain in your bladder after you finish urinating. A digital rectal exam. In a rectal exam, your health care provider checks your prostate by putting a lubricated, gloved finger into your rectum to feel the back of your prostate gland. This exam detects the size of your gland and any abnormal lumps or growths. An exam of your urine (urinalysis). A prostate specific antigen (PSA) screening. This is a blood test used to screen for prostate cancer. An ultrasound. This test uses sound waves to electronically produce a picture of your prostate gland. Your health care provider may refer you to a specialist in kidney and prostate diseases (urologist). How is this treated? Once symptoms begin, your health care provider will monitor your condition (active surveillance or watchful waiting). Treatment for this condition will depend on the severity of your condition. Treatment may include: Observation and yearly exams. This may be the only treatment needed if your condition and symptoms are mild. Medicines to relieve your symptoms, including: Medicines to shrink the prostate. Medicines to relax the muscle of the prostate. Surgery in severe cases. Surgery may include: Prostatectomy. In this procedure, the prostate tissue is removed completely through an open incision or with a laparoscope or robotics. Transurethral resection of the prostate (TURP). In this procedure, a tool is inserted through the opening at the tip of the penis (urethra).  It is used to cut away tissue of the inner core of the prostate. The pieces are removed through the same opening of the penis. This removes the blockage. Transurethral incision (TUIP). In this procedure, small cuts are made in the prostate.  This lessens the prostate's pressure on the urethra. Transurethral microwave thermotherapy (TUMT). This procedure uses microwaves to create heat. The heat destroys and removes a small amount of prostate tissue. Transurethral needle ablation (TUNA). This procedure uses radio frequencies to destroy and remove a small amount of prostate tissue. Interstitial laser coagulation ([place]). This procedure uses a laser to destroy and remove a small amount of prostate tissue. Transurethral electrovaporization (TUVP). This procedure uses electrodes to destroy and remove a small amount of prostate tissue. Prostatic urethral lift. This procedure inserts an implant to push the lobes of the prostate away from the urethra. Follow these instructions at home: Take over-the-counter and prescription medicines only as told by your health care provider. Monitor your symptoms for any changes. Contact your health care provider with any changes. Avoid drinking large amounts of liquid before going to bed or out in public. Avoid or reduce how much caffeine or alcohol you drink. Give yourself time when you urinate. Keep all follow-up visits. This is important. Contact a health care provider if: You have unexplained back pain. Your symptoms do not get better with treatment. You develop side effects from the medicine you are taking. Your urine becomes very dark or has a bad smell. Your lower abdomen becomes distended and you have trouble passing urine. Get help right away if: You have a fever or chills. You suddenly cannot urinate. You feel light-headed or very dizzy, or you faint. There are large amounts of blood or clots in your urine. Your urinary problems become hard to manage. You develop moderate to severe low back or flank pain. The flank is the side of your body between the ribs and the hip. These symptoms may be an emergency. Get help right away. Call 911. Do not wait to see if the symptoms will go away. Do not  drive yourself to the hospital. Summary Benign prostatic hyperplasia (BPH) is an enlarged prostate that is caused by the normal aging process. It is not caused by cancer. An enlarged prostate can press on the urethra. This can make it hard to pass urine. This condition is more likely to develop in men older than 50 years. Get help right away if you suddenly cannot urinate. This information is not intended to replace advice given to you by your health care provider. Make sure you discuss any questions you have with your health care provider. Document Revised: 08/09/2020 Document Reviewed: 08/09/2020 Elsevier Patient Education  [place].

## 2022-04-02 NOTE — Progress Notes (Signed)
    SUBJECTIVE:   CHIEF COMPLAINT / HPI:   Urinary Issues -Previously taking Torsemide 20mg  daily. Stopped it 5 days ago because he was "urinating too much" and having lots of incontinence -Saw cardiology yesterday, they said its fine to use the Torsemide prn for weight gain >3lbs in 24hrs or 5 lbs in a week -Ever since stopping the torsemide he's having trouble with hesitancy, difficulty initiating stream, weak stream -Some dysuria as well -No fever or back pain -No rectal pain -Not sexually active -No lesions or swelling on penis or scrotum -Endorses nocturia, minimum twice per night -Has seen urology in the past, reports normal PSA level checked ~1 year ago -Thinks he was on flomax at one point in the past  PERTINENT  PMH / PSH: HFrEF (EF <20%), a-fib/flutter, 3rd degree heart block, s/p ICD, h/o PE  OBJECTIVE:   BP 102/72   Pulse 72   Ht 6\' 1"  (1.854 m)   Wt 199 lb 12.8 oz (90.6 kg)   SpO2 100%   BMI 26.36 kg/m   General: NAD, pleasant, able to participate in exam Respiratory: No respiratory distress Skin: warm and dry, no rashes noted Psych: Normal affect and mood Neuro: grossly intact    ASSESSMENT/PLAN:   BPH (benign prostatic hyperplasia) Presentation consistent with BPH, with AUA symptom score of 27 (severe) However, UA does show +nitrites and leuks suggesting possible UTI-- will f/u culture results. Discussed risks/benefits of Flomax, especially given his BP is on the lower end and cardiology has been unable to titrate meds in the past due to soft BP. Patient aware of risk of orthostatic hypotension and wishes to trial Flomax 0.4mg  daily. Rx sent. I am unable to see PSA level but patient certain he had this checked by urology and it was normal, therefore deferred PSA today.   Maury Dus, MD [facility]

## 2022-04-02 NOTE — Progress Notes (Signed)
EPIC Encounter for ICM Monitoring  Patient Name: Philip Chavez is a 73 y.o. male Date: 04/02/2022 Primary Care Physican: Ezequiel Essex, MD Primary Cardiologist: Aundra Dubin Electrophysiologist: Lovena Le BiV Pacing:  98.9%        07/26/2021 Weight: 200 lbs 09/05/2021 Weight: 200 lbs 04/01/2021 Office Weight: 195 lbs   Clinical Status Since 26-Mar-2022 AT/AF 29 Time in AT/AF 2.6 hours/day (10.8 %)           Transmission reviewed.  Pt seen in office by Dr Aundra Dubin on 2/26 and recommendations given at that time.   OptiVol Thoracic impedance suggesting normal fluid levels.     Prescribed:  Torsemide 20 mg 1 tablet by mouth once a day. Potassium 10 mEq take 1 tablet by mouth twice a day Spironolactone 25 mg take 1 tablet by mouth once a day   Labs: 04/01/2022 Creatinine 1.36, BUN 17, Potassium 4.2, Sodium 136, GFR 56  02/12/2022 Creatinine 1.39, BUN 14, Potassium 4.1, Sodium 135, GFR 54  02/11/2022 Creatinine 1.19, BUN 10, Potassium 4.2, Sodium 137, GFR >60  A complete set of results can be found in Results Review.   Recommendations:  Recommendations given at HF clinic 2/26 OV.     Follow-up plan: ICM clinic phone appointment on 05/06/2022.   91 day device clinic remote transmission 05/24/2022.     EP/Cardiology Office Visits:  05/21/2022 with Dr Lovena Le.      Copy of ICM check sent to Dr. Lovena Le.  3 month ICM trend: 04/01/2022.    12-14 Month ICM trend:     Rosalene Billings, RN 04/02/2022 9:56 AM

## 2022-04-02 NOTE — Progress Notes (Signed)
Patient ID: Rameek Grasser, male   DOB: [birth year], 72 y.o.   MRN: [id number] PCP: Ezequiel Essex, MD EP: Dr. Lovena Le HF cardiologist: Dr Aundra Dubin  72 y.o. with history of complete heart block/Medtronic PPM, cardiomyopathy, and atrial flutter s/p ablation presents for CHF clinic evaluation.  Patient developed complete heart block in 2/15 and had PPM placed at that time.  He paces his RV continuously.  In 2/15, EF was 50-55% by echo.  By 6/15, EF had fallen to 20-25%.  Cardiolite showed possible scar but no ischemia.  On 09/03/14, he was admitted with dyspnea and found to be in atrial flutter with RLL PNA and with volume overload.  Echo showed EF 15% with diffuse hypokinesis.  He was started on IV Lasix and IV amiodarone. He converted back to NSR.  He subsequently had atrial flutter ablation.  He had right and left heart cath in 9/16, showing nonobstructive CAD and optimized filling pressures with CI 2.09.  He had upgrade to CRT by Dr Lovena Le.  EF up to 30-35% in 4/17.  Subsequently, the LV lead dislodged.  He was offered lead revision, but preferred watchful waiting. Echo in 5/18 showed EF down to 20%.    Echo in 10/20 showed EF < 20%, normal RV.  Echo 12/21 showed EF <20%, moderate LV dilation with mild LVH, mildly decreased RV systolic function.   Patient was started on Tikosyn for atrial fibrillation and converted to NSR.    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 Tikosyn was stopped and he was loaded with amiodarone. RHC demonstrated elevated filling pressures and reduced CO. He was diuresed.   In 1/24, he had extraction of nonfunctional LV lead and new LV lead was placed.   CPX in 2/24 showed mild-moderate HF limitation.   Patient returns for followup of CHF.  He is doing well.  No significant exertional dyspnea per his report.  No chest pain.  No orthopnea/PND.  No lightheadedness.  He stopped his torsemide a couple of days ago.   ECG (personally reviewed): NSR, BiV pacing  Labs (8/16): K 3.9,  creatinine 1.03, AST 106, ALT 173, HCT 45.1, LDL 132 Labs (9/16): K 3.9, creatinine 1.16, HCT 42.5 Labs (10/16): K 3.9, creatinine 1.32, HCT 39.9 Labs (2/17): K 4.1, creatinine 1.26, BNP 107, digoxin 0.3 Labs (07/10/2015): K 3.9 Creatinine 1.23  Labs (1/18): K 4.1, creatinine 1.18, digoxin 0.3 Labs (2/18): K 4, creatinine 1.24, digoxin 0.4 Labs (5/18): K 4, creatinine 1.25, hgb 14.8, digoxin 0.5 Labs (10/18): K 4, creatinine 1.24, digoxin 0.2 Labs (4/19): K 3.8, creatinine 1.22, digoxin 0.5, LDL 69, HDL 40 Labs (8/19): K 3.9, creatinine 1.35, digoxin 0.3 Labs (12/19): K 4, creatinine 1.22 Labs (1/21): K 4.2, creatinine 1.34, LDL 121 Labs (4/21): digoxin level 0.7, K 3.8, creatinine 1.28 Labs (9/21): digoxin 0.2, hgb 15, K 3.8, creatinine 1.22 Labs (12/21): digoxin 0.5 Labs (1/22): K 3.8, creatinine 1.33 Labs (3/23): K 4.3, creatinine 1.39 Labs (1/24): K 4.1, creatinine 1.39, TSH normal, LFTs normal  PMH: 1. H/o complete heart block: Has Medtronic PPM, placed in 2/15.   2. Cardiomyopathy: Echo (2/15) prior to PPM placement with EF 50-55%.  Echo (6/15) with EF 20-25%,  Cardiolite at that time showed possible scar but no ischemia.  Echo (8/16) with EF 15%, diffuse hypokinesis, mildly dilated RV with normal systolic function, PA systolic pressure 69 mmHg.  LHC/RHC (9/16) with mean RA 4, PA 31/15 mean 20, mean PCWP 8, CI 2.09, 40% mLAD stenosis.  Upgrade to MDT CRT-D in  9/16.  - Echo (4/17) with EF 30-35%.   - LV lead dislodged and loss of BiV pacing.  - Echo (5/18) with EF 20%, mild LV dilation, normal RV size with mildly decreased systolic function, mild AI.  - Echo (10/20): EF < 20%, RV normal, mild MR.  - Echo (12/21): EF <20%, moderate LV dilation with mild LVH, mildly decreased RV systolic function. - Echo (10/23); EF 15% with mild RV dysfunction - [employer] gene testing was negative for common genetic cardiomyopathies.  - Functional LV lead placed in 1/24.  - CPX 2/24: Mild-moderate HF  limitation; peak VO2 17.7, VE/VCO2 slope 36, RER 1.12.  3. CKD 4. 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  5. Atrial tachycardia: paroxysmal. 6. Atrial flutter: s/p ablation in 8/16.  7. OSA 8. Hyperlipidemia 9. Shingles with post-herpetic neuralgia.  10. Atrial fibrillation: Paroxysmal.   FH: Father with CHF, complete heart block with PPM.  Sister with complete heart block and PPM.   SH: Retired Curator, nonsmoker, lives in [place]  ROS: All systems reviewed and negative except as per HPI  Current Outpatient Medications  Medication Sig Dispense Refill   acetaminophen (TYLENOL) 325 MG tablet Take 650 mg by mouth every 6 (six) hours as needed for moderate pain.     amiodarone (PACERONE) 200 MG tablet Take 1 tablet (200 mg total) by mouth daily. 90 tablet 3   apixaban (ELIQUIS) 5 MG TABS tablet Take 1 tablet (5 mg total) by mouth 2 (two) times daily. 180 tablet 3   atorvastatin (LIPITOR) 20 MG tablet Take 1 tablet (20 mg total) by mouth daily. 30 tablet 3   diclofenac Sodium (VOLTAREN) 1 % GEL Apply 1 Application topically 4 (four) times daily as needed (pain).     digoxin (LANOXIN) 0.125 MG tablet Take 1/2 tablet (0.0625 mg total) by mouth daily. 15 tablet 6   Multiple Vitamins-Minerals (CERTAVITE/ANTIOXIDANTS) TABS Take 1 tablet by mouth daily with breakfast.     oxymetazoline (AFRIN) 0.05 % nasal spray Place 1 spray into both nostrils 2 (two) times daily as needed for congestion.     potassium chloride (KLOR-CON) 10 MEQ tablet TAKE 1 TABLET BY MOUTH 2 TIMES DAILY. 180 tablet 2   sacubitril-valsartan (ENTRESTO) 24-26 MG Take 1 tablet by mouth 2 (two) times daily. 60 tablet 11   spironolactone (ALDACTONE) 25 MG tablet TAKE 1 TABLET BY MOUTH ONCE A DAY 90 tablet 3   bisoprolol (ZEBETA) 5 MG tablet Take 1 tablet (5 mg total) by mouth daily. 90 tablet 3   dapagliflozin propanediol (FARXIGA) 10 MG TABS tablet Take 1 tablet (10 mg total) by mouth daily. 30 tablet 11   torsemide (DEMADEX)  20 MG tablet Take 1 tablet (20 mg total) by mouth daily. (Patient not taking: Reported on 04/01/2022) 30 tablet 6   No current facility-administered medications for this encounter.   BP 102/66   Pulse 78   Wt 88.5 kg (195 lb 3.2 oz)   SpO2 97%   BMI 25.75 kg/m  General: NAD Neck: No JVD, no thyromegaly or thyroid nodule.  Lungs: Clear to auscultation bilaterally with normal respiratory effort. CV: Nondisplaced PMI.  Heart regular S1/S2, no S3/S4, no murmur.  No peripheral edema.  No carotid bruit.  Normal pedal pulses.  Abdomen: Soft, nontender, no hepatosplenomegaly, no distention.  Skin: Intact without lesions or rashes.  Neurologic: Alert and oriented x 3.  Psych: Normal affect. Extremities: No clubbing or cyanosis.  HEENT: Normal.   Assessment/Plan: 1. Chronic  Systolic Heart Failure: NICM, EF 15% on echo in 8/16.  Patient had borderline reduced EF when PPM was placed in 2/15.  After that, EF fell significantly.  He has a history of complete heart block and father had CHF and CHB with pacemaker, and sounds like sister also had CHB with pacemaker. 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  He had CRT-D upgrade.  No significant CAD on last cath. [employer] gene testing negative for common genetic cardiomyopathies.   Echo in 4/17 showed improvement in EF to 30-35%.  Echo in 5/18 showed EF down to 20%. Echo in 12/21 showed EF < 20% and moderately dilated. His LV lead became dislodged and he lost BiV pacing, was RV pacing > 99% of the time.  Echo in 10/23 showed EF 15%, severely dilated LV, RV mildly reduced, mild MR, mild AI. [place] 11/23 showed low output, CI 1.5-1.6. PCWP 22.   With low output HF, he had LV lead replaced in 1/24 and is again BiV pacing. CPX in 2/24 with mild to moderate HF limitation.  NYHA class II symptoms currently, not volume overloaded on exam.  - I think he can stay off torsemide for now. Can use prn.  - continue Farxiga 10 mg  daily  - continue Entresto 24-26 mg bid, no BP room to increase.  - continue spironolactone 25 mg daily, BMET/BNP today.  - continue digoxin 0.125 mg daily, check level.  - Increase bisoprolol to 5 mg daily.  - Hold off on hydralazine/imdur w/ soft BP  - Repeat echo at followup in 3 months.  2. 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  In setting of chronic systolic HF + Tikosyn use for AFib + hypomagnesemia. Tikosyn stopped and loaded with amiodarone. No recurrent shocks on device check - No driving X 6 months after ICD shock - Continue amiodarone, recent LFTs and TSH normal in 1/24. He will need a regular eye exam.  3. Atrial flutter: s/p ablation.  4. Atrial fibrillation: He is in NSR on amiodarone.  - Continue Eliquis.  5. PE/DVT: remote and associated with surgery.   6. Hyperlipidemia: He has mild CAD.   - No ASA with Eliquis.   - Continue atorvastatin 20 mg daily.   7. Complete heart block: Now BiV pacing again.  8. CKD IIIa: BMET today.   Followup 3 months with echo.   Loralie Champagne 04/02/2022

## 2022-04-03 ENCOUNTER — Other Ambulatory Visit (HOSPITAL_COMMUNITY): Payer: Self-pay

## 2022-04-03 DIAGNOSIS — N400 Enlarged prostate without lower urinary tract symptoms: Secondary | ICD-10-CM | POA: Insufficient documentation

## 2022-04-03 NOTE — Assessment & Plan Note (Addendum)
Presentation consistent with BPH, with AUA symptom score of 27 (severe) However, UA does show +nitrites and leuks suggesting possible UTI-- will f/u culture results. Discussed risks/benefits of Flomax, especially given his BP is on the lower end and cardiology has been unable to titrate meds in the past due to soft BP. Patient aware of risk of orthostatic hypotension and wishes to trial Flomax 0.'4mg'$  daily. Rx sent. I am unable to see PSA level but patient certain he had this checked by urology and it was normal, therefore deferred PSA today.

## 2022-04-05 ENCOUNTER — Other Ambulatory Visit (HOSPITAL_COMMUNITY): Payer: Self-pay

## 2022-04-05 ENCOUNTER — Telehealth: Payer: Self-pay | Admitting: Family Medicine

## 2022-04-05 LAB — URINE CULTURE

## 2022-04-05 MED ORDER — CEPHALEXIN 250 MG PO CAPS
250.0000 mg | ORAL_CAPSULE | Freq: Four times a day (QID) | ORAL | 0 refills | Status: AC
  Filled 2022-04-05 – 2022-04-06 (×2): qty 20, 5d supply, fill #0

## 2022-04-05 NOTE — Telephone Encounter (Signed)
Called patient to discuss results of urine culture which shows >100,000 CFU of pansensitive E Coli.  Discussed with Dr. Thompson Grayer-- will treat with Keflex '250mg'$  QID x 5 days   Alcus Dad, MD PGY-3, [place]

## 2022-04-06 ENCOUNTER — Other Ambulatory Visit (HOSPITAL_COMMUNITY): Payer: Self-pay

## 2022-04-06 ENCOUNTER — Other Ambulatory Visit (HOSPITAL_BASED_OUTPATIENT_CLINIC_OR_DEPARTMENT_OTHER): Payer: Self-pay

## 2022-04-07 ENCOUNTER — Other Ambulatory Visit (HOSPITAL_COMMUNITY): Payer: Self-pay | Admitting: Cardiology

## 2022-04-08 ENCOUNTER — Other Ambulatory Visit (HOSPITAL_COMMUNITY): Payer: Self-pay

## 2022-04-08 MED ORDER — ATORVASTATIN CALCIUM 20 MG PO TABS
20.0000 mg | ORAL_TABLET | Freq: Every day | ORAL | 3 refills | Status: DC
  Filled 2022-04-08: qty 30, 30d supply, fill #0
  Filled 2022-05-07: qty 30, 30d supply, fill #1
  Filled 2022-06-07: qty 30, 30d supply, fill #2
  Filled 2022-07-08: qty 30, 30d supply, fill #3

## 2022-04-09 ENCOUNTER — Telehealth: Payer: Self-pay

## 2022-04-09 NOTE — Telephone Encounter (Signed)
Received call from patient.  He stated he stopped taking Torsemide for past about 1 week to 10 days ago due to it was causing urinary incontinence.  Per Dr Claris Gladden 2/26 OV note he can take Torsemide PRN instead of daily.  He called wanting a device report fluid level check.  Attempted to assist patient with sending manual transmission but was unsuccessful.  Provided tech support number.

## 2022-04-09 NOTE — Telephone Encounter (Signed)
Received call back and advised manual transmission was received.  Advised report suggesting possible fluid accumulation starting 2/23 and trending back toward baseline.  Advised would take PRN Torsemide but he declined and does not want to take it.  He reports HF clinic is aware.  He will check back next week to determine if fluid levels are better.

## 2022-04-10 ENCOUNTER — Telehealth: Payer: Self-pay | Admitting: Internal Medicine

## 2022-04-10 NOTE — Telephone Encounter (Signed)
I will forward a copy of my RN note to Dr. Lovena Le for review.

## 2022-04-10 NOTE — Telephone Encounter (Signed)
Pt called per message received from [facility] Triage.   Pt stated he spoke to Ms. Margarita Grizzle RN about a possible fluid build up around his heart.  Pt stated he has not been taking his Torsemide for about a week, because it was making him go to the restroom too much, and made his life more difficult.  Pt stated after speaking with Ms. Margarita Grizzle RN, he will take Torsemide when he needs it.  Pt states he is not concerned about fluid accumulation around his heart, and stated he felt fine.  Pt also stated he has an upcoming appointment with his PCP.  I attempted to educate Pt on the concerns with fluid build up, but was not interested in discussing.  I also educated Pt on signs / symptoms that would require a visit to see a provider or ER visit.  Pt was asked if he wanted his device checked again, and he declined.  I called the Pt to f/u, and he was not interested in or wanting care at this time.  Pt advised he can call [facility] at anytime to discuss concerns or check his device. Pt understood.

## 2022-04-10 NOTE — Telephone Encounter (Signed)
Pt c/o medication issue:  1. Name of Medication: torsemide (DEMADEX) 20 MG tablet   2. How are you currently taking this medication (dosage and times per day)?  Patient currently not taking  3. Are you having a reaction (difficulty breathing--STAT)? no  4. What is your medication issue? Patient states he stopped taking this medication 10 day ago.  He states yesterday he got a call from Sharman Cheek stating there might be some fluid around his heart.  He would like to speak to the nurse about this.

## 2022-04-12 ENCOUNTER — Encounter: Payer: Self-pay | Admitting: Family Medicine

## 2022-04-12 ENCOUNTER — Ambulatory Visit (INDEPENDENT_AMBULATORY_CARE_PROVIDER_SITE_OTHER): Payer: Medicare PPO | Admitting: Family Medicine

## 2022-04-12 VITALS — BP 130/64 | HR 65 | Ht 73.0 in | Wt 194.0 lb

## 2022-04-12 DIAGNOSIS — G252 Other specified forms of tremor: Secondary | ICD-10-CM | POA: Diagnosis not present

## 2022-04-12 DIAGNOSIS — R7303 Prediabetes: Secondary | ICD-10-CM

## 2022-04-12 DIAGNOSIS — Z0000 Encounter for general adult medical examination without abnormal findings: Secondary | ICD-10-CM

## 2022-04-12 NOTE — Patient Instructions (Signed)
It was wonderful to see you today. Thank you for allowing me to be a part of your care. Below is a short summary of what we discussed at your visit today:  Annual physical Today we collected lab work. If the results are normal, I will send you a letter or MyChart message. If the results are abnormal, I will give you a call.    Intention tremor I will refer you to neurology. Someone from their office should be calling you in 1 to 2 weeks to schedule an appointment.  If you do not hear from them, let us know. We may need to nudge along the referral.    We also collected labs to make sure there is no biochemical reason for your tremor. As above, if the results are normal, I will send you a letter or MyChart message. If the results are abnormal, I will give you a call.      If you have any questions or concerns, please do not hesitate to contact us via phone or MyChart message.   Ezequiel Essex, MD

## 2022-04-14 ENCOUNTER — Encounter: Payer: Self-pay | Admitting: Family Medicine

## 2022-04-14 DIAGNOSIS — G252 Other specified forms of tremor: Secondary | ICD-10-CM | POA: Insufficient documentation

## 2022-04-14 DIAGNOSIS — Z0000 Encounter for general adult medical examination without abnormal findings: Secondary | ICD-10-CM | POA: Insufficient documentation

## 2022-04-14 NOTE — Assessment & Plan Note (Signed)
Will obtain routine CBC, BMP, A1c today and follow results. Patient politely declines flu and COVID vaccines at this time.

## 2022-04-14 NOTE — Progress Notes (Signed)
SUBJECTIVE:   CHIEF COMPLAINT / HPI:   Annual physical  Philip Chavez is a pleasant 72 y.o. man who presents for his annual physical exam.  Medications and allergies updated.  Vaccines due: COVID, flu, Shingles Screenings due: None Colonoscopy: UTD, last 11/28/2021  Intention tremor [name] is concerned about an intention tremor. He has noticed this to be worsening over the last 6 months or so. 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 He wonders if this is related to his pacemaker leads in his heart.  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   Healthcare maintenance 04/14/2022   BPH (benign prostatic hyperplasia) 04/03/2022   Displacement of implantable cardioverter-defibrillator (ICD) lead 02/11/2022   Ventricular fibrillation ([place]) 12/03/2021   Hx of colonic polyps 11/28/2021   Medicare annual wellness visit, subsequent 11/21/2021   ICD (implantable cardioverter-defibrillator) in place 06/22/2021   Prediabetes 05/23/2021   Secondary hypercoagulable state ([place]) 05/29/2020   Inguinal hernia of right side with obstruction 06/09/2019   CKD (chronic kidney disease) stage 3, GFR 30-59 ml/min (HCC) 06/09/2019   Post herpetic neuralgia 12/10/2014   Nonischemic cardiomyopathy (HCC)    Atrial flutter ([place])    Persistent atrial fibrillation ([place]) 04/28/2014   Dyslipidemia 04/28/2014   HFrEF, EF <20% 04/25/2014   Pacemaker 06/17/2013   Hx of pulmonary embolus 03/12/2013   Third degree heart block ([place]) 03/12/2013   History of DVT (deep vein thrombosis) 03/11/2013    OBJECTIVE:   BP 130/64   Pulse 65   Ht '6\' 1"'$  (1.854 m)   Wt 194 lb (88 kg)   SpO2 98%   BMI 25.60  kg/m     PHQ-9:     04/12/2022   11:55 AM 04/02/2022    3:41 PM 11/21/2021    9:24 AM  Depression screen PHQ 2/9  Decreased Interest 0 0 0  Down, Depressed, Hopeless 0 0 0  PHQ - 2 Score 0 0 0  Altered sleeping 0 0   Tired, decreased energy 0 0   Change in appetite 0 0   Feeling bad or failure about yourself  0 0   Trouble concentrating 0 0   Moving slowly or fidgety/restless 0 0   Suicidal thoughts 0 0   PHQ-9 Score 0 0     Physical Exam General: Awake, alert, oriented Cardiovascular: Regular rate and rhythm, S1 and S2 present, no murmurs auscultated Respiratory: Lung fields clear to auscultation bilaterally Extremities: BL hands warm to touch, pulse intact Neuro: Cranial nerves II through X grossly intact, able to move all extremities spontaneously. Intact and equal smile, strength 5/5 and equal bilaterally of grip, BUE push/pull/internal/external rotation, sensation intact, tremor at max extension with finger-to-nose bilaterally, no cogwheel rigidity   ASSESSMENT/PLAN:   Healthcare maintenance Will obtain routine CBC, BMP, A1c today and follow results. Patient politely declines flu and COVID vaccines at this time.   Intention tremor Subacute, worsening over 6 months. Patient requests neuro referral, will place today. Will collect basic labs for new tremor including BMP, ceruloplasmin, and heavy metals panel. Will follow results.      Ezequiel Essex, MD [place]

## 2022-04-14 NOTE — Assessment & Plan Note (Signed)
Subacute, worsening over 6 months. Patient requests neuro referral, will place today. Will collect basic labs for new tremor including BMP, ceruloplasmin, and heavy metals panel. Will follow results.

## 2022-04-17 ENCOUNTER — Other Ambulatory Visit: Payer: Self-pay | Admitting: Internal Medicine

## 2022-04-18 LAB — BASIC METABOLIC PANEL
BUN/Creatinine Ratio: 12 (ref 10–24)
BUN: 17 mg/dL (ref 8–27)
CO2: 20 mmol/L (ref 20–29)
Calcium: 9.2 mg/dL (ref 8.6–10.2)
Chloride: 104 mmol/L (ref 96–106)
Creatinine, Ser: 1.38 mg/dL — ABNORMAL HIGH (ref 0.76–1.27)
Glucose: 84 mg/dL (ref 70–99)
Potassium: 4.5 mmol/L (ref 3.5–5.2)
Sodium: 140 mmol/L (ref 134–144)
eGFR: 55 mL/min/{1.73_m2} — ABNORMAL LOW (ref >59)

## 2022-04-18 LAB — HEAVY METALS, BLOOD
Arsenic: 2 ug/L (ref 0–9)
Lead, Blood: <1 ug/dL (ref 0.0–3.4)
Mercury: 3.4 ug/L (ref 0.0–14.9)

## 2022-04-18 LAB — CBC
Hematocrit: 37.6 % (ref 37.5–51.0)
Hemoglobin: 12.3 g/dL — ABNORMAL LOW (ref 13.0–17.7)
MCH: 32.6 pg (ref 26.6–33.0)
MCHC: 32.7 g/dL (ref 31.5–35.7)
MCV: 100 fL — ABNORMAL HIGH (ref 79–97)
Platelets: 238 10*3/uL (ref 150–450)
RBC: 3.77 x10E6/uL — ABNORMAL LOW (ref 4.14–5.80)
RDW: 14.3 % (ref 11.6–15.4)
WBC: 4.4 10*3/uL (ref 3.4–10.8)

## 2022-04-18 LAB — HEMOGLOBIN A1C
Est. average glucose Bld gHb Est-mCnc: 128 mg/dL
Hgb A1c MFr Bld: 6.1 % — ABNORMAL HIGH (ref 4.8–5.6)

## 2022-04-18 LAB — CERULOPLASMIN: Ceruloplasmin: 25.3 mg/dL (ref 16.0–31.0)

## 2022-04-22 ENCOUNTER — Telehealth: Payer: Self-pay | Admitting: Family Medicine

## 2022-04-22 DIAGNOSIS — D649 Anemia, unspecified: Secondary | ICD-10-CM

## 2022-04-22 NOTE — Telephone Encounter (Addendum)
Called patient regarding his labs. He answered but replied that it was not a good time to talk. He requests a call back.   Anemia  - return for iron labs at his convenience - confirm no blood loss - labs for intention tremor normal / negative - follow up with neuro:  [facility] Address: [address], [place], [place] [address] Phone: [phone]    Ezequiel Essex, MD

## 2022-04-22 NOTE — Addendum Note (Signed)
Addended by: Renard Hamper on: 04/22/2022 02:40 PM   Modules accepted: Orders

## 2022-04-23 ENCOUNTER — Telehealth: Payer: Self-pay | Admitting: Family Medicine

## 2022-04-23 DIAGNOSIS — D649 Anemia, unspecified: Secondary | ICD-10-CM

## 2022-04-23 NOTE — Telephone Encounter (Signed)
Called patient to discuss labs.   Labs for intention tremor are normal and negative.  Patient to follow-up with neurology. [facility] Address: [address], [place], [place] [address] Phone: [phone]  Only abnormality is anemia. Unclear origin.   Philip Chavez confirms no BRBPR, hematuria, dark stools, or hemoptysis Last colonoscopy 11/28/2021: Did find several polyps, including one greater than 50 mm in the descending colon.  Also found internal hemorrhoids and diverticulosis in sigmoid colon.  Surgical path for all polyps returned with tubular adenoma, negative for high-grade dysplasia or malignancy. Hemoglobin previously normal range until CBC on 02/12/2022; however this CBC was obtained after a surgery with EP for lead replacement of his pacemaker defibrillator. No prior labs on file to check for ferritin, iron, or TIBC.  This is to be expected as patient was previously with normal hemoglobin.  Patient amenable to blood draw. Scheduled for tomorrow 11:45 am.  Ezequiel Essex, MD

## 2022-04-24 ENCOUNTER — Other Ambulatory Visit: Payer: Medicare PPO

## 2022-04-24 DIAGNOSIS — D649 Anemia, unspecified: Secondary | ICD-10-CM

## 2022-04-24 NOTE — Addendum Note (Signed)
Addended by: Renard Hamper on: 04/24/2022 08:39 AM   Modules accepted: Orders

## 2022-04-24 NOTE — Addendum Note (Signed)
Addended by: Renard Hamper on: 04/24/2022 08:42 AM   Modules accepted: Orders

## 2022-04-25 LAB — CBC
Hematocrit: 38.3 % (ref 37.5–51.0)
Hemoglobin: 12.8 g/dL — ABNORMAL LOW (ref 13.0–17.7)
MCH: 32.3 pg (ref 26.6–33.0)
MCHC: 33.4 g/dL (ref 31.5–35.7)
MCV: 97 fL (ref 79–97)
Platelets: 161 10*3/uL (ref 150–450)
RBC: 3.96 x10E6/uL — ABNORMAL LOW (ref 4.14–5.80)
RDW: 13.9 % (ref 11.6–15.4)
WBC: 4.1 10*3/uL (ref 3.4–10.8)

## 2022-04-25 LAB — IRON,TIBC AND FERRITIN PANEL
Ferritin: 36 ng/mL (ref 30–400)
Iron Saturation: 29 % (ref 15–55)
Iron: 89 ug/dL (ref 38–169)
Total Iron Binding Capacity: 305 ug/dL (ref 250–450)
UIBC: 216 ug/dL (ref 111–343)

## 2022-04-25 LAB — RETICULOCYTES: Retic Ct Pct: 1.6 % (ref 0.6–2.6)

## 2022-04-25 LAB — VITAMIN B12: Vitamin B-12: 862 pg/mL (ref 232–1245)

## 2022-04-25 LAB — FOLATE: Folate: 19.2 ng/mL (ref >3.0)

## 2022-04-26 LAB — PATHOLOGIST SMEAR REVIEW
Basophils Absolute: 0 10*3/uL (ref 0.0–0.2)
Basos: 0 %
EOS (ABSOLUTE): 0 10*3/uL (ref 0.0–0.4)
Eos: 1 %
Hematocrit: 39.5 % (ref 37.5–51.0)
Hemoglobin: 12.7 g/dL — ABNORMAL LOW (ref 13.0–17.7)
Immature Grans (Abs): 0 10*3/uL (ref 0.0–0.1)
Immature Granulocytes: 0 %
Lymphocytes Absolute: 1 10*3/uL (ref 0.7–3.1)
Lymphs: 22 %
MCH: 32 pg (ref 26.6–33.0)
MCHC: 32.2 g/dL (ref 31.5–35.7)
MCV: 100 fL — ABNORMAL HIGH (ref 79–97)
Monocytes Absolute: 0.4 10*3/uL (ref 0.1–0.9)
Monocytes: 9 %
Neutrophils Absolute: 2.9 10*3/uL (ref 1.4–7.0)
Neutrophils: 68 %
Platelets: 161 10*3/uL (ref 150–450)
RBC: 3.97 x10E6/uL — ABNORMAL LOW (ref 4.14–5.80)
RDW: 14.2 % (ref 11.6–15.4)
WBC: 4.4 10*3/uL (ref 3.4–10.8)

## 2022-04-29 ENCOUNTER — Other Ambulatory Visit (HOSPITAL_COMMUNITY): Payer: Self-pay

## 2022-04-29 ENCOUNTER — Telehealth: Payer: Self-pay

## 2022-04-29 ENCOUNTER — Encounter: Payer: Self-pay | Admitting: Family Medicine

## 2022-04-29 ENCOUNTER — Other Ambulatory Visit (HOSPITAL_COMMUNITY)
Admission: RE | Admit: 2022-04-29 | Discharge: 2022-04-29 | Disposition: A | Discharge status: Home / Self Care | Payer: Medicare PPO | Source: Ambulatory Visit | Attending: Family Medicine | Admitting: Family Medicine

## 2022-04-29 ENCOUNTER — Ambulatory Visit: Payer: Medicare PPO | Admitting: Family Medicine

## 2022-04-29 VITALS — BP 93/69 | HR 100 | Wt 197.0 lb

## 2022-04-29 DIAGNOSIS — K4091 Unilateral inguinal hernia, without obstruction or gangrene, recurrent: Secondary | ICD-10-CM

## 2022-04-29 DIAGNOSIS — N5082 Scrotal pain: Secondary | ICD-10-CM

## 2022-04-29 DIAGNOSIS — Z23 Encounter for immunization: Secondary | ICD-10-CM

## 2022-04-29 LAB — POCT URINALYSIS DIP (MANUAL ENTRY)
Bilirubin, UA: NEGATIVE
Glucose, UA: >=1000 mg/dL — AB
Ketones, POC UA: NEGATIVE mg/dL
Leukocytes, UA: NEGATIVE
Nitrite, UA: POSITIVE — AB
Protein Ur, POC: NEGATIVE mg/dL
Spec Grav, UA: 1.02 (ref 1.010–1.025)
Urobilinogen, UA: 0.2 E.U./dL
pH, UA: 5.5 (ref 5.0–8.0)

## 2022-04-29 LAB — POCT UA - MICROSCOPIC ONLY: Epithelial cells, urine per micros: NONE SEEN

## 2022-04-29 MED ORDER — IBUPROFEN 600 MG PO TABS
600.0000 mg | ORAL_TABLET | Freq: Three times a day (TID) | ORAL | 0 refills | Status: AC | PRN
  Filled 2022-04-29: qty 30, 10d supply, fill #0

## 2022-04-29 NOTE — Progress Notes (Cosign Needed Addendum)
  SUBJECTIVE:   CHIEF COMPLAINT / HPI:   Sharp pain R groin into scrotum, started a couple of weeks ago. Was not sudden onset, no hx of trauma to the area. Took Tylenol last night which helped only temporarily.  Recently had E. coli UTI, treated with Keflex x 5 days.  Underwent inguinal hernia repair within last couple of years, follows with [place] surgery, last seen a few months ago.  Feels that scrotum is much bigger than normal, and painful to touch.  No fevers. No dysuria. No changes in bowel movements. No N/V. No abdominal pain though he does feel a right sided inguinal hernia present.  No new sexual partners.  No abnormal penile discharge.  No blood in urine or stool.  Able to have and maintain an erection and ejaculate normally.  TWIST score for testicular torsion: Testicular swelling: 2/2 Hard testicle: 2/2 Absent cremasteric: 0/1 N/V: 0/1 High riding testicle: 1/1 Score of 5: U/S recommended for further eval   PERTINENT  PMH / PSH: inguinal hernia repair  OBJECTIVE:  BP 93/69   Pulse 100   Wt 197 lb (89.4 kg)   SpO2 (!) 69%   BMI 25.99 kg/m   General: NAD, pleasant, able to participate in exam Cardiac: RRR, no murmurs auscultated Respiratory: CTAB, normal WOB Abdomen: soft, non-tender, non-distended, normoactive bowel sounds Extremities: warm and well perfused, no edema or cyanosis GU: Testicular/scrotal swelling present bilaterally.  Left testicle does appear lower than the right.  Cremasteric reflex present bilaterally.  Testicles are firm and moderately tender bilaterally.  Pain does not improve with elevation of the scrotum.   Inguinal hernia palpable in inguinal canal on the right, not significantly tender to palpation  ASSESSMENT/PLAN:   1. Scrotal pain TWIST score 4-5 as noted, will obtain scrotal ultrasound with Doppler for further evaluation.  Reassuringly against acute testicular torsion, no nausea/vomiting or abdominal pain  and pain was not  sudden onset.  However, given his significant swelling and tenderness feel that imaging is warranted as well as referral to urology for further evaluation/management.  Provided patient with strict return precautions and information about testicular torsion, patient agrees to visit the ED if his pain significantly worsens or if he starts developing any abdominal pain, nausea, vomiting.  Continue pain control with Tylenol and ibuprofen.  Will also obtain UA given recently treated UTI and urine G/C, though this seems less likely given that he reports no new sexual partners.  - US SCROTUM W/DOPPLER; Future - POCT urinalysis dipstick - Urine Culture - Urine cytology ancillary only - Ambulatory referral to Urology - ibuprofen (ADVIL) 600 MG tablet; Take 1 tablet (600 mg total) by mouth every 8 (eight) hours as needed.  Dispense: 30 tablet; Refill: 0 - POCT UA - Microscopic Only  2. Unilateral recurrent inguinal hernia without obstruction or gangrene Patient underwent inguinal hernia repair with [facility] surgery, now appears to have recurrent right-sided inguinal hernia.  Does not appear to be acutely incarcerated, but feel that he should be evaluated by surgery team for further management. - Ambulatory referral to General Surgery  Meds ordered this encounter  Medications   ibuprofen (ADVIL) 600 MG tablet    Sig: Take 1 tablet (600 mg total) by mouth every 8 (eight) hours as needed.    Dispense:  30 tablet    Refill:  0   Return in about 4 weeks (around 05/27/2022) for scrotal pain.  August Albino, MD [facility]

## 2022-04-29 NOTE — Telephone Encounter (Signed)
Patient calls nurse line requesting an appointment with Dr. Jeani Hawking. Advised that Dr. Jeani Hawking does not have any appointments until next week.   Patient states that he needs to see any provider as soon as possible. He states that he has been having pain in scrotal area for the last few days. He also reports that urine is darker than normal.   Attempted to gather more information, patient became upset and states that he just needs to be evaluated by provider.   *Per chart review, patient also contacted [facility] regarding this concern. He had a right sided inguinal hernia repair in 09/2020.  Scheduled for this afternoon with Dr. Joeseph Amor. Patient states that he will be here this afternoon, phone was then disconnected.   Talbot Grumbling, RN

## 2022-04-29 NOTE — Patient Instructions (Signed)
It was wonderful to see you today.  Please bring ALL of your medications with you to every visit.   Updates from today's visit:  I will give you a call with the results from the ultrasound.  If your urine is abnormal, I will give you a call, otherwise you will receive a letter in the mail.  I have placed a referral for you to see urology and general surgery  Please visit the ED if you start having severe worsening of your pain, abdominal pain, fevers, nausea or vomiting.  Please follow up in 1 month  Thank you for choosing [facility].   Please call [phone] with any questions about today's appointment.  Please be sure to schedule follow up at the front  desk before you leave today.   Philip Albino, MD  Family Medicine

## 2022-04-30 ENCOUNTER — Other Ambulatory Visit (HOSPITAL_COMMUNITY): Payer: Self-pay

## 2022-05-01 ENCOUNTER — Other Ambulatory Visit (HOSPITAL_COMMUNITY): Payer: Self-pay

## 2022-05-01 LAB — URINE CYTOLOGY ANCILLARY ONLY
Chlamydia: NEGATIVE
Comment: NEGATIVE
Comment: NORMAL
Neisseria Gonorrhea: NEGATIVE

## 2022-05-01 LAB — URINE CULTURE

## 2022-05-02 ENCOUNTER — Other Ambulatory Visit (HOSPITAL_COMMUNITY): Payer: Self-pay

## 2022-05-02 ENCOUNTER — Telehealth: Payer: Self-pay | Admitting: Family Medicine

## 2022-05-02 DIAGNOSIS — N410 Acute prostatitis: Secondary | ICD-10-CM

## 2022-05-02 MED ORDER — SULFAMETHOXAZOLE-TRIMETHOPRIM 800-160 MG PO TABS
1.0000 | ORAL_TABLET | Freq: Two times a day (BID) | ORAL | 0 refills | Status: AC
  Filled 2022-05-02: qty 84, 42d supply, fill #0

## 2022-05-02 NOTE — Telephone Encounter (Signed)
Called pt to discuss results. UTI did not resolve with 5d course of keflex, still growing >100,000 cfu E. Coli. Will treat as prostatitis and prescribe Bactrim DS BID x 6 weeks. May also cover for any epididymitis that could be causing his scrotal pain/swelling. States pain has been well controlled and has not worsened.  Reminded patient about ultrasound appointment at 0830 on 4/1. Reiterated strict ED precautions for acute worsening of symptoms due to concern for possible torsion.  Pt agrees with plan and denies further questions. States he will also reach out to his cardiology office to see when his next appointment is.  August Albino, MD

## 2022-05-03 ENCOUNTER — Other Ambulatory Visit (HOSPITAL_COMMUNITY): Payer: Self-pay

## 2022-05-06 ENCOUNTER — Ambulatory Visit: Payer: Medicare PPO | Attending: Internal Medicine

## 2022-05-06 ENCOUNTER — Other Ambulatory Visit (HOSPITAL_COMMUNITY): Payer: Self-pay

## 2022-05-06 ENCOUNTER — Ambulatory Visit (HOSPITAL_COMMUNITY)
Admission: RE | Admit: 2022-05-06 | Discharge: 2022-05-06 | Disposition: A | Discharge status: Home / Self Care | Payer: Medicare PPO | Source: Ambulatory Visit | Attending: Family Medicine | Admitting: Family Medicine

## 2022-05-06 DIAGNOSIS — N5082 Scrotal pain: Secondary | ICD-10-CM | POA: Diagnosis not present

## 2022-05-06 DIAGNOSIS — I5022 Chronic systolic (congestive) heart failure: Secondary | ICD-10-CM | POA: Diagnosis not present

## 2022-05-06 DIAGNOSIS — N433 Hydrocele, unspecified: Secondary | ICD-10-CM | POA: Diagnosis not present

## 2022-05-06 DIAGNOSIS — Z95810 Presence of automatic (implantable) cardiac defibrillator: Secondary | ICD-10-CM | POA: Diagnosis not present

## 2022-05-08 ENCOUNTER — Encounter: Payer: Self-pay | Admitting: Family Medicine

## 2022-05-08 NOTE — Progress Notes (Signed)
EPIC Encounter for ICM Monitoring  Patient Name: Philip Chavez is a 72 y.o. male Date: 05/08/2022 Primary Care Physican: Ezequiel Essex, MD Primary Cardiologist: Aundra Dubin Electrophysiologist: Lovena Le BiV Pacing:  99%        07/26/2021 Weight: 200 lbs 09/05/2021 Weight: 200 lbs 04/01/2021 Office Weight: 195 lbs 05/08/2022 Weight: 195 lbs   Since 15-Apr-2022 VT-NS (>4 beats, >162 bpm) 1 AT/AF   4 Time in AT/AF  <0.1 hours/day (<0.1 %)           Spoke with patient and heart failure questions reviewed.  Transmission results reviewed.  Pt asymptomatic for fluid accumulation.  Reports feeling well at this time and voices no complaints.     OptiVol Thoracic impedance suggesting normal fluid levels.     Prescribed:  Torsemide 20 mg 1 tablet by mouth once a day.   05/07/2022 Pt reports stopping Torsemide due to causing urinary incontinence.  Potassium 10 mEq take 1 tablet by mouth twice a day Spironolactone 25 mg take 1 tablet by mouth once a day   Labs: 04/12/2022 Creatinine 1.38, BUN 17, Potassium 4.5, Sodium 140, GFR 55 04/01/2022 Creatinine 1.36, BUN 17, Potassium 4.2, Sodium 136, GFR 56  02/12/2022 Creatinine 1.39, BUN 14, Potassium 4.1, Sodium 135, GFR 54  02/11/2022 Creatinine 1.19, BUN 10, Potassium 4.2, Sodium 137, GFR >60  A complete set of results can be found in Results Review.   Recommendations:  No changes and encouraged to call if experiencing any fluid symptoms.   Follow-up plan: ICM clinic phone appointment on 06/10/2022.   91 day device clinic remote transmission 05/24/2022.     EP/Cardiology Office Visits:  05/21/2022 with Dr Lovena Le.      Copy of ICM check sent to Dr. Lovena Le.   3 month ICM trend: 05/07/2022.    12-14 Month ICM trend:     Rosalene Billings, RN 05/08/2022 10:58 AM

## 2022-05-09 ENCOUNTER — Other Ambulatory Visit (HOSPITAL_COMMUNITY): Payer: Self-pay

## 2022-05-11 ENCOUNTER — Other Ambulatory Visit (HOSPITAL_COMMUNITY): Payer: Self-pay

## 2022-05-13 ENCOUNTER — Other Ambulatory Visit (HOSPITAL_COMMUNITY): Payer: Self-pay

## 2022-05-13 ENCOUNTER — Telehealth: Payer: Self-pay | Admitting: Family Medicine

## 2022-05-13 DIAGNOSIS — D649 Anemia, unspecified: Secondary | ICD-10-CM

## 2022-05-13 MED ORDER — FERROUS SULFATE 324 (65 FE) MG PO TBEC
1.0000 | DELAYED_RELEASE_TABLET | Freq: Every day | ORAL | 3 refills | Status: AC
  Filled 2022-05-13: qty 100, 100d supply, fill #0

## 2022-05-13 NOTE — Telephone Encounter (Signed)
Called patient regarding anemia. All labs thus far normal. Iron borderline low. Will start with supplementation ferrous sulfate 324 mg daily x1 month with recheck in 1 month.   Fayette Pho, MD

## 2022-05-14 ENCOUNTER — Other Ambulatory Visit (HOSPITAL_COMMUNITY): Payer: Self-pay

## 2022-05-20 ENCOUNTER — Other Ambulatory Visit (HOSPITAL_COMMUNITY): Payer: Self-pay

## 2022-05-21 ENCOUNTER — Ambulatory Visit: Payer: Medicare PPO | Admitting: Internal Medicine

## 2022-05-22 ENCOUNTER — Encounter (HOSPITAL_COMMUNITY): Payer: Self-pay

## 2022-05-22 ENCOUNTER — Emergency Department (HOSPITAL_COMMUNITY): Payer: Medicare PPO

## 2022-05-22 ENCOUNTER — Emergency Department (HOSPITAL_COMMUNITY)
Admission: EM | Admit: 2022-05-22 | Discharge: 2022-05-22 | Disposition: A | Discharge status: Home / Self Care | Payer: Medicare PPO | Attending: Emergency Medicine | Admitting: Emergency Medicine

## 2022-05-22 ENCOUNTER — Ambulatory Visit (HOSPITAL_COMMUNITY)
Admission: EM | Admit: 2022-05-22 | Discharge: 2022-05-22 | Disposition: A | Discharge status: Home / Self Care | Payer: Medicare PPO

## 2022-05-22 DIAGNOSIS — Z950 Presence of cardiac pacemaker: Secondary | ICD-10-CM | POA: Insufficient documentation

## 2022-05-22 DIAGNOSIS — Z79899 Other long term (current) drug therapy: Secondary | ICD-10-CM | POA: Insufficient documentation

## 2022-05-22 DIAGNOSIS — R42 Dizziness and giddiness: Secondary | ICD-10-CM | POA: Diagnosis not present

## 2022-05-22 DIAGNOSIS — Y92410 Unspecified street and highway as the place of occurrence of the external cause: Secondary | ICD-10-CM | POA: Insufficient documentation

## 2022-05-22 DIAGNOSIS — S065X0A Traumatic subdural hemorrhage without loss of consciousness, initial encounter: Secondary | ICD-10-CM | POA: Diagnosis not present

## 2022-05-22 DIAGNOSIS — M7918 Myalgia, other site: Secondary | ICD-10-CM | POA: Diagnosis not present

## 2022-05-22 DIAGNOSIS — S065XAA Traumatic subdural hemorrhage with loss of consciousness status unknown, initial encounter: Secondary | ICD-10-CM

## 2022-05-22 DIAGNOSIS — I509 Heart failure, unspecified: Secondary | ICD-10-CM | POA: Insufficient documentation

## 2022-05-22 DIAGNOSIS — I110 Hypertensive heart disease with heart failure: Secondary | ICD-10-CM | POA: Insufficient documentation

## 2022-05-22 DIAGNOSIS — R2689 Other abnormalities of gait and mobility: Secondary | ICD-10-CM | POA: Diagnosis not present

## 2022-05-22 DIAGNOSIS — Z7901 Long term (current) use of anticoagulants: Secondary | ICD-10-CM | POA: Insufficient documentation

## 2022-05-22 DIAGNOSIS — R519 Headache, unspecified: Secondary | ICD-10-CM | POA: Diagnosis present

## 2022-05-22 DIAGNOSIS — R791 Abnormal coagulation profile: Secondary | ICD-10-CM | POA: Insufficient documentation

## 2022-05-22 DIAGNOSIS — Z043 Encounter for examination and observation following other accident: Secondary | ICD-10-CM | POA: Diagnosis not present

## 2022-05-22 LAB — CBC
HCT: 39.4 % (ref 39.0–52.0)
Hemoglobin: 13 g/dL (ref 13.0–17.0)
MCH: 32.3 pg (ref 26.0–34.0)
MCHC: 33 g/dL (ref 30.0–36.0)
MCV: 97.8 fL (ref 80.0–100.0)
Platelets: 214 10*3/uL (ref 150–400)
RBC: 4.03 MIL/uL — ABNORMAL LOW (ref 4.22–5.81)
RDW: 14.2 % (ref 11.5–15.5)
WBC: 4.5 10*3/uL (ref 4.0–10.5)
nRBC: 0 % (ref 0.0–0.2)

## 2022-05-22 LAB — PROTIME-INR
INR: 1.3 — ABNORMAL HIGH (ref 0.8–1.2)
Prothrombin Time: 15.8 seconds — ABNORMAL HIGH (ref 11.4–15.2)

## 2022-05-22 LAB — COMPREHENSIVE METABOLIC PANEL
ALT: 23 U/L (ref 0–44)
AST: 23 U/L (ref 15–41)
Albumin: 3.4 g/dL — ABNORMAL LOW (ref 3.5–5.0)
Alkaline Phosphatase: 84 U/L (ref 38–126)
Anion gap: 7 (ref 5–15)
BUN: 13 mg/dL (ref 8–23)
CO2: 24 mmol/L (ref 22–32)
Calcium: 9 mg/dL (ref 8.9–10.3)
Chloride: 104 mmol/L (ref 98–111)
Creatinine, Ser: 1.56 mg/dL — ABNORMAL HIGH (ref 0.61–1.24)
GFR, Estimated: 47 mL/min — ABNORMAL LOW (ref >60)
Glucose, Bld: 159 mg/dL — ABNORMAL HIGH (ref 70–99)
Potassium: 3.9 mmol/L (ref 3.5–5.1)
Sodium: 135 mmol/L (ref 135–145)
Total Bilirubin: 0.4 mg/dL (ref 0.3–1.2)
Total Protein: 7.3 g/dL (ref 6.5–8.1)

## 2022-05-22 NOTE — Progress Notes (Signed)
Patient ID: Philip Chavez, male   DOB: 09-13-1950, 72 y.o.   MRN: [id number] BP 107/70 (BP Location: Left Arm)   Pulse 74   Temp 98 F (36.7 C)   Resp 17   SpO2 99%  Films reviewed. Subdural is small, and chronic. No operative intervention is needed.  Alert and oriented x 4, speech is clear and fluent Moving all extremities Perrl, full eom Symmetric facies, tongue and uvula midline 5/5 strength. CT cervical spine is good, chronic degenerative changes. Nothing acute.  OK to discharge.
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
## 2022-05-23 ENCOUNTER — Emergency Department (HOSPITAL_COMMUNITY): Payer: Medicare PPO

## 2022-05-23 ENCOUNTER — Other Ambulatory Visit: Payer: Self-pay

## 2022-05-23 ENCOUNTER — Emergency Department (HOSPITAL_COMMUNITY)
Admission: EM | Admit: 2022-05-23 | Discharge: 2022-05-23 | Disposition: A | Discharge status: Home / Self Care | Payer: Medicare PPO | Attending: Emergency Medicine | Admitting: Emergency Medicine

## 2022-05-23 DIAGNOSIS — S065X0A Traumatic subdural hemorrhage without loss of consciousness, initial encounter: Secondary | ICD-10-CM | POA: Diagnosis not present

## 2022-05-23 DIAGNOSIS — R42 Dizziness and giddiness: Secondary | ICD-10-CM | POA: Diagnosis not present

## 2022-05-23 DIAGNOSIS — Z7901 Long term (current) use of anticoagulants: Secondary | ICD-10-CM | POA: Insufficient documentation

## 2022-05-23 DIAGNOSIS — R531 Weakness: Secondary | ICD-10-CM | POA: Diagnosis not present

## 2022-05-23 DIAGNOSIS — I6201 Nontraumatic acute subdural hemorrhage: Secondary | ICD-10-CM | POA: Diagnosis not present

## 2022-05-23 DIAGNOSIS — S065XAA Traumatic subdural hemorrhage with loss of consciousness status unknown, initial encounter: Secondary | ICD-10-CM

## 2022-05-23 LAB — CBC
HCT: 40.9 % (ref 39.0–52.0)
Hemoglobin: 13.2 g/dL (ref 13.0–17.0)
MCH: 32.2 pg (ref 26.0–34.0)
MCHC: 32.3 g/dL (ref 30.0–36.0)
MCV: 99.8 fL (ref 80.0–100.0)
Platelets: 224 10*3/uL (ref 150–400)
RBC: 4.1 MIL/uL — ABNORMAL LOW (ref 4.22–5.81)
RDW: 14.3 % (ref 11.5–15.5)
WBC: 4.6 10*3/uL (ref 4.0–10.5)
nRBC: 0 % (ref 0.0–0.2)

## 2022-05-23 LAB — BASIC METABOLIC PANEL
Anion gap: 8 (ref 5–15)
BUN: 12 mg/dL (ref 8–23)
CO2: 24 mmol/L (ref 22–32)
Calcium: 8.9 mg/dL (ref 8.9–10.3)
Chloride: 104 mmol/L (ref 98–111)
Creatinine, Ser: 1.44 mg/dL — ABNORMAL HIGH (ref 0.61–1.24)
GFR, Estimated: 52 mL/min — ABNORMAL LOW (ref >60)
Glucose, Bld: 121 mg/dL — ABNORMAL HIGH (ref 70–99)
Potassium: 3.9 mmol/L (ref 3.5–5.1)
Sodium: 136 mmol/L (ref 135–145)
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
## 2022-05-23 NOTE — Discharge Instructions (Signed)
1.  At this time your repeat CT scan does not show any worsening or changing of your brain bleed. 2.  Continue to monitor very closely at home.  Return immediately for any concerning changes.
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
## 2022-05-24 ENCOUNTER — Ambulatory Visit (INDEPENDENT_AMBULATORY_CARE_PROVIDER_SITE_OTHER): Payer: Medicare PPO

## 2022-05-24 DIAGNOSIS — I428 Other cardiomyopathies: Secondary | ICD-10-CM

## 2022-05-24 LAB — CUP PACEART REMOTE DEVICE CHECK
Battery Remaining Longevity: 100 mo
Battery Voltage: 3.05 V
Brady Statistic RV Percent Paced: 0 %
Date Time Interrogation Session: 20240419042256
HighPow Impedance: 52 Ohm
Implantable Lead Connection Status: 753985
Implantable Lead Connection Status: 753985
Implantable Lead Connection Status: 753985
Implantable Lead Implant Date: 20150209
Implantable Lead Implant Date: 20160919
Implantable Lead Implant Date: 20160919
Implantable Lead Location: 753858
Implantable Lead Location: 753859
Implantable Lead Location: 753860
Implantable Lead Model: 4598
Implantable Lead Model: 5076
Implantable Lead Model: 6935
Implantable Pulse Generator Implant Date: 20160919
Lead Channel Impedance Value: 266 Ohm
Lead Channel Impedance Value: 361 Ohm
Lead Channel Impedance Value: 361 Ohm
Lead Channel Impedance Value: 380 Ohm
Lead Channel Impedance Value: 399 Ohm
Lead Channel Impedance Value: 703 Ohm
Lead Channel Pacing Threshold Amplitude: 0.5 V
Lead Channel Pacing Threshold Pulse Width: 0.4 ms
Lead Channel Sensing Intrinsic Amplitude: 2.5 mV
Lead Channel Sensing Intrinsic Amplitude: 9.9 mV
Lead Channel Setting Pacing Amplitude: 2 V
Lead Channel Setting Pacing Amplitude: 3 V
Lead Channel Setting Pacing Pulse Width: 0.4 ms
Lead Channel Setting Sensing Sensitivity: 0.3 mV
Zone Setting Status: 755011
Zone Setting Status: 755011
Zone Setting Status: 755011

## 2022-05-29 ENCOUNTER — Telehealth: Payer: Self-pay

## 2022-05-29 NOTE — Telephone Encounter (Signed)
     Patient  visit on 4/18  at [facility]    Have you been able to follow up with your primary care physician? Yes   The patient was or was not able to obtain any needed medicine or equipment. Yes    Are there diet recommendations that you are having difficulty following? Na   Patient expresses understanding of discharge instructions and education provided has no other needs at this time.  Yes      Lenard Forth [facility] Guide, [facility] [phone] [address] [place], [place], [place] [address] Phone: [phone] Email: [email or web address]

## 2022-05-30 ENCOUNTER — Ambulatory Visit: Payer: Medicare PPO | Attending: Internal Medicine | Admitting: Internal Medicine

## 2022-05-30 ENCOUNTER — Encounter: Payer: Self-pay | Admitting: Internal Medicine

## 2022-05-30 VITALS — BP 98/62 | HR 68 | Ht 73.0 in | Wt 187.0 lb

## 2022-05-30 DIAGNOSIS — Z95810 Presence of automatic (implantable) cardiac defibrillator: Secondary | ICD-10-CM | POA: Diagnosis not present

## 2022-05-30 DIAGNOSIS — I4891 Unspecified atrial fibrillation: Secondary | ICD-10-CM | POA: Diagnosis not present

## 2022-05-30 DIAGNOSIS — I442 Atrioventricular block, complete: Secondary | ICD-10-CM | POA: Diagnosis not present

## 2022-05-30 NOTE — Patient Instructions (Addendum)
Medication Instructions:  Your physician recommends that you continue on your current medications as directed. Please refer to the Current Medication list given to you today.  *If you need a refill on your cardiac medications before your next appointment, please call your pharmacy*  Lab Work: None ordered.  If you have labs (blood work) drawn today and your tests are completely normal, you will receive your results only by: MyChart Message (if you have MyChart) OR A paper copy in the mail If you have any lab test that is abnormal or we need to change your treatment, we will call you to review the results.  Testing/Procedures: None ordered.  Follow-Up: At [facility], you and your health needs are our priority.  As part of our continuing mission to provide you with exceptional heart care, we have created designated Provider Care Teams.  These Care Teams include your primary Cardiologist (physician) and Advanced Practice Providers (APPs -  Physician Assistants and Nurse Practitioners) who all work together to provide you with the care you need, when you need it.  Your next appointment:   Please schedule a 6 month follow up appointment on Monday, Wednesday, or Friday per Dr. Lewayne Bunting, and Pt request.  The format for your next appointment:   In Person  Provider:   Lewayne Bunting, MD{or one of the following Advanced Practice Providers on your designated Care Team:   Francis Dowse, [place] Casimiro Needle "Mardelle Matte" Lanna Poche, [place]  Remote monitoring is used to monitor your ICD from home. This monitoring reduces the number of office visits required to check your device to one time per year. It allows [place] to keep an eye on the functioning of your device to ensure it is working properly. You are scheduled for a device check from home on 06/10/22. You may send your transmission at any time that day. If you have a wireless device, the transmission will be sent automatically. After your physician reviews your  transmission, you will receive a postcard with your next transmission date.
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
## 2022-05-30 NOTE — Progress Notes (Signed)
Pt called at 150 pm on 05/30/2022 per Dr Lubertha Basque order to resume Eliquis / start taking again on Friday, 06/14/2022.    Pt verbalized understanding, and will resume taking Eliquis 5 mg BID per Dr. Ladona Ridgel order.

## 2022-05-31 ENCOUNTER — Encounter: Payer: Self-pay | Admitting: Family Medicine

## 2022-05-31 ENCOUNTER — Ambulatory Visit: Payer: Medicare PPO | Admitting: Family Medicine

## 2022-05-31 ENCOUNTER — Other Ambulatory Visit (HOSPITAL_COMMUNITY): Payer: Self-pay

## 2022-05-31 VITALS — BP 90/50 | HR 66 | Ht 73.0 in | Wt 188.0 lb

## 2022-05-31 DIAGNOSIS — N390 Urinary tract infection, site not specified: Secondary | ICD-10-CM | POA: Diagnosis not present

## 2022-05-31 DIAGNOSIS — Z23 Encounter for immunization: Secondary | ICD-10-CM | POA: Diagnosis not present

## 2022-05-31 DIAGNOSIS — N453 Epididymo-orchitis: Secondary | ICD-10-CM | POA: Diagnosis not present

## 2022-05-31 DIAGNOSIS — R319 Hematuria, unspecified: Secondary | ICD-10-CM | POA: Diagnosis not present

## 2022-05-31 LAB — POCT URINALYSIS DIP (MANUAL ENTRY)
Bilirubin, UA: NEGATIVE
Blood, UA: NEGATIVE
Glucose, UA: >=1000 mg/dL — AB
Ketones, POC UA: NEGATIVE mg/dL
Leukocytes, UA: NEGATIVE
Nitrite, UA: NEGATIVE
Protein Ur, POC: NEGATIVE mg/dL
Spec Grav, UA: 1.02 (ref 1.010–1.025)
Urobilinogen, UA: 0.2 E.U./dL
pH, UA: 5.5 (ref 5.0–8.0)

## 2022-05-31 MED ORDER — ZOSTER VAC RECOMB ADJUVANTED 50 MCG/0.5ML IM SUSR: 0.5000 mL | Freq: Once | INTRAMUSCULAR | 0 refills | Status: AC

## 2022-05-31 NOTE — Patient Instructions (Addendum)
It was wonderful to see you today. Thank you for allowing me to be a part of your care. Below is a short summary of what we discussed at your visit today:  Scrotal swelling Please call urology directly to schedule an appointment.  [facility] Address: [address] [place], [place], [place] [address] Phone: [phone] Continue the Bactrim antibiotic until it is completely finished. This will make for 6 weeks total.  STOP taking the Farxiga (dapagliflozin) until your urologist tells you to start again.   Subdural hematoma Per your cardiologist's note, they want you to resume your Eliquis on Friday, 06/14/2022.   Shingles Vaccine I have written a prescription for the shingles vaccine.  Please take this to your pharmacy to have this administered. Your insurance prefers you get this specific vaccine at the pharmacy instead of in clinic.   Primary care doctor graduating soon! I am graduating June 28th. You will be assigned a new PCP.  Thank you for letting me care for you! It has been wonderful getting to know you.    Please bring all of your medications to every appointment!  If you have any questions or concerns, please do not hesitate to contact us via phone or MyChart message.   Fayette Pho, MD
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
## 2022-06-01 LAB — URINALYSIS, MICROSCOPIC ONLY
Bacteria, UA: NONE SEEN
Casts: NONE SEEN /lpf
Epithelial Cells (non renal): NONE SEEN /hpf (ref 0–10)
RBC, Urine: NONE SEEN /hpf (ref 0–2)
WBC, UA: NONE SEEN /hpf (ref 0–5)

## 2022-06-03 DIAGNOSIS — N390 Urinary tract infection, site not specified: Secondary | ICD-10-CM | POA: Insufficient documentation

## 2022-06-03 DIAGNOSIS — N453 Epididymo-orchitis: Secondary | ICD-10-CM | POA: Insufficient documentation

## 2022-06-03 DIAGNOSIS — Z23 Encounter for immunization: Secondary | ICD-10-CM | POA: Insufficient documentation

## 2022-06-03 HISTORY — DX: Epididymo-orchitis: N45.3

## 2022-06-03 HISTORY — DX: Urinary tract infection, site not specified: N39.0

## 2022-06-03 LAB — URINE CULTURE: Organism ID, Bacteria: NO GROWTH

## 2022-06-03 NOTE — Assessment & Plan Note (Signed)
Improving on Bactrim.  Continue Bactrim until completed as prescribed.  Patient provided with urology clinic information so he can call for an appointment.

## 2022-06-03 NOTE — Assessment & Plan Note (Signed)
Provided printed prescription for Shingrix today.

## 2022-06-03 NOTE — Assessment & Plan Note (Addendum)
UA collected today much improved from previous. Will send culture.  Patient instructed to stop Philip Chavez until he has been cleared by urology to resume.

## 2022-06-07 ENCOUNTER — Other Ambulatory Visit (HOSPITAL_COMMUNITY): Payer: Self-pay

## 2022-06-10 ENCOUNTER — Ambulatory Visit: Payer: Medicare PPO | Attending: Internal Medicine

## 2022-06-10 DIAGNOSIS — Z95810 Presence of automatic (implantable) cardiac defibrillator: Secondary | ICD-10-CM | POA: Diagnosis not present

## 2022-06-10 DIAGNOSIS — I5022 Chronic systolic (congestive) heart failure: Secondary | ICD-10-CM | POA: Diagnosis not present

## 2022-06-12 NOTE — Progress Notes (Signed)
EPIC Encounter for ICM Monitoring  Patient Name: Philip Chavez is a 72 y.o. male Date: 06/12/2022 Primary Care Physican: Fayette Pho, MD Primary Cardiologist: Shirlee Latch Electrophysiologist: Ladona Ridgel BiV Pacing:  99.6%        07/26/2021 Weight: 200 lbs 09/05/2021 Weight: 200 lbs 04/01/2021 Office Weight: 195 lbs 05/08/2022 Weight: 195 lbs 06/12/2022 Weight: 195 lbs   Since 30-May-2022 AT/AF   1 Time in AT/AF  <0.1 hours/day (<0.1 %)           Spoke with patient and heart failure questions reviewed.  Transmission results reviewed.  Pt asymptomatic for fluid accumulation.  Reports feeling well at this time and voices no complaints.     OptiVol Thoracic impedance suggesting possible fluid accumulation starting 4/28 and trending close to baseline.     Prescribed:  Torsemide 20 mg 1 tablet by mouth once a day.   05/07/2022 Pt reports stopping Torsemide due to causing urinary incontinence.  Potassium 10 mEq take 1 tablet by mouth twice a day Spironolactone 25 mg take 1 tablet by mouth once a day   Labs: 05/23/2022 Creatinine 1.44, BUN 12, Potassium 3.9, Sodium 136, GFR 52  05/22/2022 Creatinine 1.56, BUN 13, Potassium 3.9, Sodium 135, GFR 47  04/12/2022 Creatinine 1.38, BUN 17, Potassium 4.5, Sodium 140, GFR 55 04/01/2022 Creatinine 1.36, BUN 17, Potassium 4.2, Sodium 136, GFR 56  02/12/2022 Creatinine 1.39, BUN 14, Potassium 4.1, Sodium 135, GFR 54  02/11/2022 Creatinine 1.19, BUN 10, Potassium 4.2, Sodium 137, GFR >60  A complete set of results can be found in Results Review.   Recommendations:  No changes and encouraged to call if experiencing any fluid symptoms.   Follow-up plan: ICM clinic phone appointment on 07/15/2022.   91 day device clinic remote transmission 08/23/2022.     EP/Cardiology Office Visits:  Next 6 month f/u appointment with Dr Ladona Ridgel (no recall).   Recall 06/30/2022 with Dr Shirlee Latch.   Copy of ICM check sent to Dr. Ladona Ridgel.   3 month ICM trend: 06/10/2022.    12-14 Month  ICM trend:     Karie Soda, RN 06/12/2022 11:42 AM

## 2022-06-19 ENCOUNTER — Other Ambulatory Visit (HOSPITAL_COMMUNITY): Payer: Self-pay

## 2022-06-21 NOTE — Progress Notes (Signed)
Remote ICD transmission.   

## 2022-06-24 ENCOUNTER — Ambulatory Visit: Payer: Medicare PPO | Admitting: Neurology

## 2022-06-24 ENCOUNTER — Encounter: Payer: Self-pay | Admitting: Neurology

## 2022-06-24 VITALS — BP 88/45 | HR 61 | Ht 73.0 in | Wt 192.0 lb

## 2022-06-24 DIAGNOSIS — R251 Tremor, unspecified: Secondary | ICD-10-CM

## 2022-06-24 NOTE — Patient Instructions (Addendum)
You have a mild tremor of both hands, but no evidence parkinson's like disease or what we call parkinsonism. For your tremor, I would not recommend any new medications at this time. Your tremor is mild and may be caused by stress or certain medications.  Please remember, that any kind of tremor may be exacerbated by anxiety, anger, nervousness, excitement, dehydration, sleep deprivation, thyroid dysfunction, by caffeine, and low blood sugar values or blood sugar fluctuations. Some medications can exacerbate tremors, this includes certain asthma or COPD medications and certain antidepressants.  You can follow up with your primary care, cardiologist and other providers as scheduled/planned.
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
## 2022-06-28 ENCOUNTER — Other Ambulatory Visit (HOSPITAL_COMMUNITY): Payer: Self-pay

## 2022-06-28 ENCOUNTER — Other Ambulatory Visit: Payer: Self-pay | Admitting: Family Medicine

## 2022-06-28 DIAGNOSIS — N410 Acute prostatitis: Secondary | ICD-10-CM

## 2022-07-05 ENCOUNTER — Other Ambulatory Visit (HOSPITAL_COMMUNITY): Payer: Self-pay

## 2022-07-07 ENCOUNTER — Other Ambulatory Visit: Payer: Self-pay | Admitting: Cardiology

## 2022-07-08 ENCOUNTER — Telehealth: Payer: Self-pay

## 2022-07-08 MED ORDER — DIGOXIN 125 MCG PO TABS
0.0625 mg | ORAL_TABLET | Freq: Every day | ORAL | 6 refills | Status: DC
  Filled 2022-07-08: qty 15, 30d supply, fill #0
  Filled 2022-08-04: qty 15, 30d supply, fill #1
  Filled 2022-09-08: qty 15, 30d supply, fill #2
  Filled 2022-09-10 – 2022-10-06 (×2): qty 15, 30d supply, fill #3
  Filled 2022-11-06: qty 15, 30d supply, fill #4
  Filled 2022-12-04: qty 15, 30d supply, fill #5
  Filled 2023-01-03: qty 15, 30d supply, fill #6

## 2022-07-08 NOTE — Telephone Encounter (Signed)
Patient calls nurse line requesting an apt with Larita Fife.   Patient scheduled for 6/7.

## 2022-07-08 NOTE — Telephone Encounter (Signed)
Patient calls nurse line stating the he needs to speak with Dr. Larita Fife.   I asked if there was anything that I could help him with.   He states that he saw Neurologist on 5/20 and feels that tremors have been worse since then.   He is not wanting to go back to this doctor. He is asking to see the neurologist that he saw while he was in the hospital. He saw Dr. Franky Macho in ED viist on 05/22/22. Looks like this provider is with [facility].   Patient is requesting to speak with Dr. Larita Fife regarding concerns.   Please return call to [phone].  Veronda Prude, RN

## 2022-07-08 NOTE — Telephone Encounter (Signed)
Refill request

## 2022-07-09 ENCOUNTER — Other Ambulatory Visit (HOSPITAL_COMMUNITY): Payer: Self-pay

## 2022-07-09 NOTE — Telephone Encounter (Signed)
Called patient. He reports he has an appointment scheduled with myself on Friday and that we could simply speak then. Denies any urgent matters that cannot wait until Friday. I will see him then.   Fayette Pho, MD

## 2022-07-12 ENCOUNTER — Ambulatory Visit: Payer: Medicare PPO | Admitting: Family Medicine

## 2022-07-12 ENCOUNTER — Other Ambulatory Visit (HOSPITAL_COMMUNITY): Payer: Self-pay

## 2022-07-12 ENCOUNTER — Encounter: Payer: Self-pay | Admitting: Family Medicine

## 2022-07-12 VITALS — BP 120/64 | HR 69 | Ht 73.0 in | Wt 196.0 lb

## 2022-07-12 DIAGNOSIS — S065XAA Traumatic subdural hemorrhage with loss of consciousness status unknown, initial encounter: Secondary | ICD-10-CM | POA: Diagnosis not present

## 2022-07-12 DIAGNOSIS — I4892 Unspecified atrial flutter: Secondary | ICD-10-CM

## 2022-07-12 DIAGNOSIS — I480 Paroxysmal atrial fibrillation: Secondary | ICD-10-CM

## 2022-07-12 DIAGNOSIS — G250 Essential tremor: Secondary | ICD-10-CM | POA: Diagnosis not present

## 2022-07-12 MED ORDER — PROPRANOLOL HCL ER 60 MG PO CP24
60.0000 mg | ORAL_CAPSULE | Freq: Every day | ORAL | 2 refills | Status: DC
Start: 2022-07-12 — End: 2022-09-23
  Filled 2022-07-12: qty 30, 30d supply, fill #0
  Filled 2022-08-10: qty 30, 30d supply, fill #1
  Filled 2022-09-08: qty 30, 30d supply, fill #2
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
## 2022-07-13 ENCOUNTER — Encounter: Payer: Self-pay | Admitting: Family Medicine

## 2022-07-13 ENCOUNTER — Other Ambulatory Visit (HOSPITAL_COMMUNITY): Payer: Self-pay

## 2022-07-13 DIAGNOSIS — S065XAA Traumatic subdural hemorrhage with loss of consciousness status unknown, initial encounter: Secondary | ICD-10-CM | POA: Insufficient documentation

## 2022-07-13 DIAGNOSIS — G250 Essential tremor: Secondary | ICD-10-CM | POA: Insufficient documentation

## 2022-07-13 NOTE — Assessment & Plan Note (Addendum)
Believe this to be a benign essential tremor. Neurology unconcerned. As patient is no longer on bisoprolol from cardiology (as part of med reduction), could start another beta blocker instead to help. Will start propanolol ER lowest dose recommended for tremor - 60 mg daily. Discussed return precautions, including dizziness.

## 2022-07-13 NOTE — Assessment & Plan Note (Signed)
Seen on CT head 4/17 and 4/18, thought to be stable. Patient has started back on his Eliquis 2.5 weeks ago, as originally instructed in the ED. Reports doing well, although he would like to check in on it to make sure it is gone. Will order CT head WO.

## 2022-07-14 ENCOUNTER — Other Ambulatory Visit (HOSPITAL_COMMUNITY): Payer: Self-pay | Admitting: Family Medicine

## 2022-07-15 ENCOUNTER — Ambulatory Visit: Payer: Medicare PPO | Attending: Internal Medicine

## 2022-07-15 DIAGNOSIS — Z95810 Presence of automatic (implantable) cardiac defibrillator: Secondary | ICD-10-CM

## 2022-07-15 DIAGNOSIS — I5022 Chronic systolic (congestive) heart failure: Secondary | ICD-10-CM

## 2022-07-15 NOTE — Progress Notes (Signed)
EPIC Encounter for ICM Monitoring  Patient Name: Philip Chavez is a 72 y.o. male Date: 07/15/2022 Primary Care Physican: Fayette Pho, MD Primary Cardiologist: Shirlee Latch Electrophysiologist: Ladona Ridgel BiV Pacing:  99.7%        07/26/2021 Weight: 200 lbs 09/05/2021 Weight: 200 lbs 04/01/2021 Office Weight: 195 lbs 05/08/2022 Weight: 195 lbs 06/12/2022 Weight: 195 lbs   Since 28-Jun-2022 VT-NS (>4 beats, >162 bpm) 1 Time in AT/AF  0.0 hours/day (0.0 %)           Spoke with patient and heart failure questions reviewed.  Transmission results reviewed.  Pt asymptomatic for fluid accumulation.  Reports feeling well at this time and voices no complaints.  He thinks he is drinking too much fluid and eating too much salt.    OptiVol Thoracic impedance suggesting possible fluid accumulation starting 6/1.    Prescribed:  Torsemide 20 mg 1 tablet by mouth once a day.   Confirmed on 07/15/2022 he stopped taking Torsemide several months ago due to causing urinary incontinence.  Potassium 10 mEq take 1 tablet by mouth twice a day Spironolactone 25 mg take 1 tablet by mouth once a day   Labs: 05/23/2022 Creatinine 1.44, BUN 12, Potassium 3.9, Sodium 136, GFR 52  05/22/2022 Creatinine 1.56, BUN 13, Potassium 3.9, Sodium 135, GFR 47  04/12/2022 Creatinine 1.38, BUN 17, Potassium 4.5, Sodium 140, GFR 55 04/01/2022 Creatinine 1.36, BUN 17, Potassium 4.2, Sodium 136, GFR 56  02/12/2022 Creatinine 1.39, BUN 14, Potassium 4.1, Sodium 135, GFR 54  02/11/2022 Creatinine 1.19, BUN 10, Potassium 4.2, Sodium 137, GFR >60  A complete set of results can be found in Results Review.   Recommendations:  He declines to take Torsemide and agreed to decrease fluid and salt intake.    Follow-up plan: ICM clinic phone appointment on 07/22/2022 to recheck fluid levels.   91 day device clinic remote transmission 08/23/2022.     EP/Cardiology Office Visits:  Recall 11/04/2022 with Dr Ladona Ridgel.   Recall 06/30/2022 with Dr Shirlee Latch.    Copy of ICM check sent to Dr. Ladona Ridgel.   3 month ICM trend: 07/15/2022.    12-14 Month ICM trend:     Karie Soda, RN 07/15/2022 1:33 PM

## 2022-07-18 ENCOUNTER — Other Ambulatory Visit (HOSPITAL_COMMUNITY): Payer: Self-pay

## 2022-07-19 ENCOUNTER — Telehealth: Payer: Self-pay | Admitting: Internal Medicine

## 2022-07-19 NOTE — Telephone Encounter (Signed)
Philip Chavez states he was asked if he is willing to donate a kidney and he would like to know if this is something Dr. Ladona Ridgel agrees with. Please advise.

## 2022-07-22 ENCOUNTER — Telehealth (HOSPITAL_COMMUNITY): Payer: Self-pay | Admitting: Cardiology

## 2022-07-22 NOTE — Telephone Encounter (Signed)
-  pt called requesting input from Dr Shirlee Latch Pt is considering donating a kidney Wanted to know from a cardiology stand point if this is something he can consider

## 2022-07-22 NOTE — Telephone Encounter (Signed)
Pt aware and voiced understanding 

## 2022-07-23 NOTE — Telephone Encounter (Signed)
Absolutely not. This is contra-indicated with his heart condition.

## 2022-07-24 NOTE — Telephone Encounter (Signed)
Spoke with patient and shared Dr. Lubertha Basque response:  Absolutely not. This is contra-indicated with his heart condition.       Patient verbalized understanding and expressed appreciation for call.

## 2022-07-24 NOTE — Progress Notes (Signed)
No ICM remote transmission received for 07/22/2022 and next ICM transmission scheduled for 08/19/2022.

## 2022-07-26 ENCOUNTER — Telehealth: Payer: Self-pay

## 2022-07-26 NOTE — Telephone Encounter (Signed)
CT has been authorized. I called pt and gave him the number to cal and schedule CT with GI. Sunday Spillers, CMA

## 2022-07-26 NOTE — Telephone Encounter (Signed)
Patient calls nurse line in regards to CT.   He reports he is needing this scheduled.   Will forward to referral coordinator to see if this needs to be authorized.   Will call to schedule once approved.

## 2022-07-29 DIAGNOSIS — H52223 Regular astigmatism, bilateral: Secondary | ICD-10-CM | POA: Diagnosis not present

## 2022-07-29 DIAGNOSIS — H5203 Hypermetropia, bilateral: Secondary | ICD-10-CM | POA: Diagnosis not present

## 2022-07-29 DIAGNOSIS — H40013 Open angle with borderline findings, low risk, bilateral: Secondary | ICD-10-CM | POA: Diagnosis not present

## 2022-07-29 DIAGNOSIS — H524 Presbyopia: Secondary | ICD-10-CM | POA: Diagnosis not present

## 2022-08-02 ENCOUNTER — Ambulatory Visit
Admission: RE | Admit: 2022-08-02 | Discharge: 2022-08-02 | Disposition: A | Discharge status: Home / Self Care | Payer: Medicare PPO | Source: Ambulatory Visit | Attending: Family Medicine | Admitting: Family Medicine

## 2022-08-02 DIAGNOSIS — S065XAA Traumatic subdural hemorrhage with loss of consciousness status unknown, initial encounter: Secondary | ICD-10-CM

## 2022-08-02 DIAGNOSIS — I6200 Nontraumatic subdural hemorrhage, unspecified: Secondary | ICD-10-CM | POA: Diagnosis not present

## 2022-08-06 ENCOUNTER — Telehealth: Payer: Self-pay

## 2022-08-06 NOTE — Telephone Encounter (Signed)
Patient LVM on nurse line regarding results from CT head.   Called patient and advised that CT results are still pending. Advised that our office would reach out to him once these results were in.   Veronda Prude, RN

## 2022-08-08 ENCOUNTER — Other Ambulatory Visit (HOSPITAL_COMMUNITY): Payer: Self-pay | Admitting: Cardiology

## 2022-08-09 ENCOUNTER — Other Ambulatory Visit (HOSPITAL_COMMUNITY): Payer: Self-pay

## 2022-08-09 MED ORDER — ATORVASTATIN CALCIUM 20 MG PO TABS
20.0000 mg | ORAL_TABLET | Freq: Every day | ORAL | 0 refills | Status: DC
  Filled 2022-08-09: qty 90, 90d supply, fill #0

## 2022-08-10 ENCOUNTER — Other Ambulatory Visit (HOSPITAL_COMMUNITY): Payer: Self-pay

## 2022-08-12 ENCOUNTER — Other Ambulatory Visit (HOSPITAL_COMMUNITY): Payer: Self-pay

## 2022-08-12 ENCOUNTER — Other Ambulatory Visit (HOSPITAL_COMMUNITY): Payer: Self-pay | Admitting: Cardiology

## 2022-08-12 ENCOUNTER — Other Ambulatory Visit (INDEPENDENT_AMBULATORY_CARE_PROVIDER_SITE_OTHER): Payer: Medicare PPO | Admitting: Pharmacist

## 2022-08-12 MED ORDER — APIXABAN 5 MG PO TABS
5.0000 mg | ORAL_TABLET | Freq: Two times a day (BID) | ORAL | 3 refills | Status: DC
  Filled 2022-08-12: qty 180, 90d supply, fill #0
  Filled 2022-11-11: qty 180, 90d supply, fill #1
  Filled 2023-02-09: qty 180, 90d supply, fill #2
  Filled 2023-05-17: qty 180, 90d supply, fill #3

## 2022-08-12 NOTE — Patient Instructions (Addendum)
Please restart taking your Farxiga (dapagliflozin) once daily.

## 2022-08-12 NOTE — Progress Notes (Signed)
Pharmacy Quality Measure Review  This patient is appearing on a report for being at risk of failing the adherence measure for diabetes medications this calendar year.   Medication: Dapagliflozin (taking for CHF And CKD) denies having diabetes Last fill date: 05/01/2022 for 30 day supply   Patient aware that Cardiologist, Dr. Shirlee Latch prescribed and ordered refills for long-term use.  Patient verbalized understanding of need to take daily and plans to resume treatment including picking up new supply of medication.  (Refills remain).   Patient also asked for Dr. Royal Piedra to contact him RE the results of his CT at some point this week.  I shared that I would ask him to do so by routing message to him today.

## 2022-08-13 ENCOUNTER — Other Ambulatory Visit (HOSPITAL_COMMUNITY): Payer: Self-pay

## 2022-08-13 ENCOUNTER — Telehealth: Payer: Self-pay | Admitting: Student

## 2022-08-13 ENCOUNTER — Encounter: Payer: Self-pay | Admitting: Family Medicine

## 2022-08-13 NOTE — Telephone Encounter (Signed)
Left VM identifying myself as Dr. Raymondo Band asked me to call patient regarding updated head CT that was ordered by Dr. Larita Fife. Stated CT head showed improvement from last CT. Small residual blood leftover but no further concerns.

## 2022-08-13 NOTE — Telephone Encounter (Signed)
-----   Message from Kathrin Ruddy, RPH-CPP sent at 08/12/2022  4:41 PM EDT ----- Dr. Royal Piedra,  Please call patient and share CT results from 6/27 with I'm at your earliest opportunity this week. Thanks

## 2022-08-13 NOTE — Progress Notes (Signed)
Reviewed and agree with Dr Koval's plan.   

## 2022-08-16 ENCOUNTER — Ambulatory Visit (INDEPENDENT_AMBULATORY_CARE_PROVIDER_SITE_OTHER): Payer: Medicare PPO

## 2022-08-16 VITALS — Ht 73.0 in | Wt 196.0 lb

## 2022-08-16 DIAGNOSIS — Z0000 Encounter for general adult medical examination without abnormal findings: Secondary | ICD-10-CM | POA: Diagnosis not present

## 2022-08-16 NOTE — Progress Notes (Addendum)
Subjective:   Philip Chavez is a 72 y.o. male who presents for Medicare Annual/Subsequent preventive examination.  Visit Complete: Virtual  I connected with  Loretha Stapler on 08/16/22 by a audio enabled telemedicine application and verified that I am speaking with the correct person using two identifiers.  Patient Location: Home  Provider Location: Home Office  I discussed the limitations of evaluation and management by telemedicine. The patient expressed understanding and agreed to proceed.  Review of Systems     Cardiac Risk Factors include: advanced age (>60men, >18 women);diabetes mellitus;dyslipidemia;hypertension;male gender  Per patient no change in vitals since last visit, unable to obtain new vitals due to telehealth visit    Objective:    Today's Vitals   08/16/22 1916  Weight: 196 lb (88.9 kg)  Height: 6\' 1"  (1.854 m)   Body mass index is 25.86 kg/m.     08/16/2022    7:23 PM 05/31/2022    4:01 PM 05/23/2022    9:01 AM 05/22/2022    8:17 PM 04/29/2022    3:57 PM 04/12/2022   11:55 AM 04/02/2022    3:42 PM  Advanced Directives  Does Patient Have a Medical Advance Directive? No No No No No No No  Would patient like information on creating a medical advance directive? Yes (MAU/Ambulatory/Procedural Areas - Information given) No - Patient declined   No - Patient declined No - Patient declined No - Patient declined    Current Medications (verified) Outpatient Encounter Medications as of 08/16/2022  Medication Sig   acetaminophen (TYLENOL) 325 MG tablet Take 650 mg by mouth every 6 (six) hours as needed for moderate pain.   amiodarone (PACERONE) 200 MG tablet Take 1 tablet (200 mg total) by mouth daily.   apixaban (ELIQUIS) 5 MG TABS tablet Take 1 tablet (5 mg) by mouth 2 times daily.   atorvastatin (LIPITOR) 20 MG tablet Take 1 tablet (20 mg total) by mouth daily. NEEDS FOLLOW UP APPOINTMENT FOR MORE REFILLS   dapagliflozin propanediol (FARXIGA) 10 MG TABS tablet  Take 1 tablet (10 mg total) by mouth daily.   diclofenac Sodium (VOLTAREN) 1 % GEL Apply 1 Application topically 4 (four) times daily as needed (pain).   digoxin (LANOXIN) 0.125 MG tablet Take 1/2 tablet (0.0625 mg total) by mouth daily.   ferrous sulfate 324 (65 Fe) MG TBEC Take 1 tablet (325 mg total) by mouth daily.   ibuprofen (ADVIL) 600 MG tablet Take 1 tablet (600 mg total) by mouth every 8 (eight) hours as needed.   Multiple Vitamins-Minerals (CERTAVITE/ANTIOXIDANTS) TABS Take 1 tablet by mouth daily with breakfast.   oxymetazoline (AFRIN) 0.05 % nasal spray Place 1 spray into both nostrils 2 (two) times daily as needed for congestion.   potassium chloride (KLOR-CON) 10 MEQ tablet TAKE 1 TABLET BY MOUTH TWICE A DAY   propranolol ER (INDERAL LA) 60 MG 24 hr capsule Take 1 capsule (60 mg total) by mouth daily.   sacubitril-valsartan (ENTRESTO) 24-26 MG Take 1 tablet by mouth 2 (two) times daily.   spironolactone (ALDACTONE) 25 MG tablet TAKE 1 TABLET BY MOUTH ONCE A DAY   tamsulosin (FLOMAX) 0.4 MG CAPS capsule Take 1 capsule (0.4 mg total) by mouth daily.   torsemide (DEMADEX) 20 MG tablet Take 1 tablet (20 mg total) by mouth daily.   [DISCONTINUED] isosorbide dinitrate (ISORDIL) 10 MG tablet Take 1 tablet (10 mg total) by mouth 3 (three) times daily.   No facility-administered encounter medications on file as of 08/16/2022.  Allergies (verified) Patient has no known allergies.   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 08/2014 presented w/ aflutter->converted on amio;  b. CHA2DS2VASc = 2 -->Xarelto.   Atrial tachycardia    Chronic combined systolic and diastolic CHF (congestive heart failure) (HCC)    a. 03/2013 Echo EF 50-55%;  b. 07/2013 Echo EF 20-25%, diff HK, Gr3 DD;  c. 08/2014 Echo: EF 15%, diff HK, mild AI/MR, mildly dil LA/RV, mod TR,  PASP .   DVT (deep venous thrombosis) (HCC)    a. 2007 RLE: S/P ankle surgery;  b. 03/2013 LLE DVT & PE-->Xarelto.   Embolism, pulmonary with infarction (HCC)    a. 2007 RLE: S/P ankle surgery;  b. 03/2013 LLE DVT & PE-->Xarelto.   Epididymoorchitis 06/03/2022   Flank pain 05/03/2021   Glucosuria 05/03/2021   Hypercholesterolemia    Hypercoagulable state (HCC)    Hypertension    Musculoskeletal neck pain    Nonischemic cardiomyopathy (HCC)    a. 07/2013 Echo: EF 20-25%;  b. 07/2013 Myoview: Large inferior, lateral, apical scar w/ HK, no ischemia, EF 17%;  b. 08/2014 Echo: EF 15%, diff HK.   Pneumonia ~ 04/2014; 09/02/2014   Presence of permanent cardiac pacemaker    a. 03/2013 s/p MDT ADDRL1 Adapta DC PPM, ser # [id number].   Sleep apnea    does not wear mask (09/02/2014)   Third degree heart block (HCC)    a. 03/2013 s/p MDT ADDRL1 Adapta DC PPM, ser # [id number].   Urinary tract infection with hematuria 06/03/2022   Past Surgical History:  Procedure Laterality Date   BIV ICD GENERATOR CHANGEOUT N/A 02/11/2022   Procedure: BIV ICD GENERATOR CHANGEOUT;  Surgeon: Marinus Maw, MD;  Location: [facility] INVASIVE CV LAB;  Service: Cardiovascular;  Laterality: N/A;   CARDIAC CATHETERIZATION N/A 10/17/2014   Procedure: Right/Left Heart Cath and Coronary Angiography;  Surgeon: Laurey Morale, MD;  Location: [facility] INVASIVE CV LAB;  Service: Cardiovascular;  Laterality: N/A;   CARDIOVERSION N/A 07/26/2020   Procedure: CARDIOVERSION;  Surgeon: Jodelle Red, MD;  Location: [facility] ENDOSCOPY;  Service: Cardiovascular;  Laterality: N/A;   COLONOSCOPY WITH PROPOFOL N/A 11/28/2021   Procedure: COLONOSCOPY WITH PROPOFOL;  Surgeon: Charlott Rakes, MD;  Location: [facility] ENDOSCOPY;  Service: Gastroenterology;  Laterality: N/A;   ELECTROPHYSIOLOGIC STUDY N/A 09/07/2014   Procedure: A-Flutter;  Surgeon: Marinus Maw, MD;  Location: [facility] INVASIVE CV LAB;  Service: Cardiovascular;  Laterality: N/A;   EP IMPLANTABLE  DEVICE  10/24/2014   BIV   EP IMPLANTABLE DEVICE N/A 10/24/2014   Procedure: BiV ICD Upgrade;  Surgeon: Marinus Maw, MD;  Location: [facility] INVASIVE CV LAB;  Service: Cardiovascular;  Laterality: N/A;   FOOT FRACTURE SURGERY Right 2007   FRACTURE SURGERY     INGUINAL HERNIA REPAIR Right 1980's   INGUINAL HERNIA REPAIR Right 09/22/2020   Procedure: LAPAROSCOPIC RIGHT INGUINAL HERNIA REPAIR WITH MESH;  Surgeon: Axel Filler, MD;  Location: [facility] OR;  Service: General;  Laterality: Right;   INSERT / REPLACE / REMOVE PACEMAKER     MDT ADDRL1 pacemaker implanted by Dr Ladona Ridgel for complete heart block   LEAD EXTRACTION N/A 02/11/2022   Procedure: LEAD EXTRACTION;  Surgeon: Marinus Maw, MD;  Location: [facility] INVASIVE CV LAB;  Service: Cardiovascular;  Laterality: N/A;   PERMANENT PACEMAKER INSERTION N/A 03/15/2013  Procedure: PERMANENT PACEMAKER INSERTION;  Surgeon: Marinus Maw, MD;  Location: [facility] CATH LAB;  Service: Cardiovascular;  Laterality: N/A;   POLYPECTOMY  11/28/2021   Procedure: POLYPECTOMY;  Surgeon: Charlott Rakes, MD;  Location: [facility] ENDOSCOPY;  Service: Gastroenterology;;   RIGHT HEART CATH N/A 12/05/2021   Procedure: RIGHT HEART CATH;  Surgeon: Laurey Morale, MD;  Location: [facility] INVASIVE CV LAB;  Service: Cardiovascular;  Laterality: N/A;   SCLEROTHERAPY  11/28/2021   Procedure: SCLEROTHERAPY;  Surgeon: Charlott Rakes, MD;  Location: [facility] ENDOSCOPY;  Service: Gastroenterology;;   SUBMUCOSAL TATTOO INJECTION  11/28/2021   Procedure: SUBMUCOSAL TATTOO INJECTION;  Surgeon: Charlott Rakes, MD;  Location: [facility] ENDOSCOPY;  Service: Gastroenterology;;   Family History  Problem Relation Age of Onset   Diabetes type II Mother    Heart disease Father    Arrhythmia Father    Arrhythmia Sister    Hypertension Neg Hx    Heart attack Neg Hx    Stroke Neg Hx    Tremor Neg Hx    Parkinson's disease Neg Hx    Social History   Socioeconomic History   Marital status: Married    Spouse  name: Magda Paganini   Number of children: 2   Years of education: Not on file   Highest education level: Not on file  Occupational History   Not on file  Tobacco Use   Smoking status: Former    Current packs/day: 0.00    Average packs/day: 1.5 packs/day for 10.0 years (15.0 ttl pk-yrs)    Types: Cigarettes    Start date: 02/05/1972    Quit date: 02/04/1982    Years since quitting: 40.5    Passive exposure: Past   Smokeless tobacco: Never   Tobacco comments:    09/02/2014  "quit smoking 20-30 yr ago"  Vaping Use   Vaping status: Never Used  Substance and Sexual Activity   Alcohol use: No   Drug use: No   Sexual activity: Yes  Other Topics Concern   Not on file  Social History Narrative   Caffeine: none   Right handed   Social Determinants of Health   Financial Resource Strain: Low Risk  (08/16/2022)   Overall Financial Resource Strain (CARDIA)    Difficulty of Paying Living Expenses: Not hard at all  Food Insecurity: No Food Insecurity (08/16/2022)   Hunger Vital Sign    Worried About Running Out of Food in the Last Year: Never true    Ran Out of Food in the Last Year: Never true  Transportation Needs: No Transportation Needs (08/16/2022)   PRAPARE - Administrator, Civil Service (Medical): No    Lack of Transportation (Non-Medical): No  Physical Activity: Insufficiently Active (08/16/2022)   Exercise Vital Sign    Days of Exercise per Week: 3 days    Minutes of Exercise per Session: 30 min  Stress: No Stress Concern Present (08/16/2022)   [employer] of Occupational Health - Occupational Stress Questionnaire    Feeling of Stress : Not at all  Social Connections: Socially Integrated (08/16/2022)   Social Connection and Isolation Panel [NHANES]    Frequency of Communication with Friends and Family: More than three times a week    Frequency of Social Gatherings with Friends and Family: Three times a week    Attends Religious Services: More than 4 times per year     Active Member of Clubs or Organizations: Yes    Attends Banker Meetings: More than 4  times per year    Marital Status: Married    Tobacco Counseling Counseling given: Not Answered Tobacco comments: 09/02/2014  "quit smoking 20-30 yr ago"   Clinical Intake:  Pre-visit preparation completed: Yes  Pain : No/denies pain     Diabetes: Yes CBG done?: No Did pt. bring in CBG monitor from home?: No  How often do you need to have someone help you when you read instructions, pamphlets, or other written materials from your doctor or pharmacy?: 1 - Never  Interpreter Needed?: No  Information entered by :: Kandis Fantasia LPN   Activities of Daily Living    08/16/2022    7:16 PM 02/11/2022    2:07 PM  In your present state of health, do you have any difficulty performing the following activities:  Hearing? 0   Vision? 0   Difficulty concentrating or making decisions? 0   Walking or climbing stairs? 0   Dressing or bathing? 0   Doing errands, shopping? 0 1  Preparing Food and eating ? N   Using the Toilet? N   In the past six months, have you accidently leaked urine? N   Do you have problems with loss of bowel control? N   Managing your Medications? N   Managing your Finances? N   Housekeeping or managing your Housekeeping? N     Patient Care Team: Shelby Mattocks, DO as PCP - General (Family Medicine) Laurey Morale, MD as PCP - Advanced Heart Failure (Cardiology) Marinus Maw, MD as PCP - Electrophysiology (Cardiology)  Indicate any recent Medical Services you may have received from other than [facility] providers in the past year (date may be approximate).     Assessment:   This is a routine wellness examination for Philip Chavez.  Hearing/Vision screen Hearing Screening - Comments:: Denies hearing difficulties   Vision Screening - Comments:: Wears rx glasses - up to date with routine eye exams (unable to provide name of provider)     Dietary issues and  exercise activities discussed:     Goals Addressed             This Visit's Progress    Remain active and independent        Depression Screen    08/16/2022    7:23 PM 07/12/2022    2:57 PM 05/31/2022    4:01 PM 04/29/2022    3:55 PM 04/12/2022   11:55 AM 04/02/2022    3:41 PM 11/21/2021    9:24 AM  PHQ 2/9 Scores  PHQ - 2 Score 0 0 0 0 0 0 0  PHQ- 9 Score 0 0 0 0 0 0     Fall Risk    08/16/2022    7:24 PM 07/12/2022    2:57 PM 05/31/2022    4:01 PM 04/29/2022    3:55 PM 04/12/2022   11:55 AM  Fall Risk   Falls in the past year? 0 0 0 0 1  Number falls in past yr: 0 0 0 0 0  Injury with Fall? 0  0 0 0  Risk for fall due to : Impaired balance/gait      Follow up Falls prevention discussed;Education provided;Falls evaluation completed        MEDICARE RISK AT HOME:  Medicare Risk at Home - 08/16/22 1924     Any stairs in or around the home? No    If so, are there any without handrails? No    Home free of loose throw rugs  in walkways, pet beds, electrical cords, etc? Yes    Adequate lighting in your home to reduce risk of falls? Yes    Life alert? No    Use of a cane, walker or w/c? No    Grab bars in the bathroom? Yes    Shower chair or bench in shower? No    Elevated toilet seat or a handicapped toilet? No             TIMED UP AND GO:  Was the test performed?  No    Cognitive Function:    11/21/2021    9:44 AM 11/21/2021    9:27 AM  MMSE - Mini Mental State Exam  Orientation to time 5 5  Orientation to Place 5 5  Attention/ Calculation 5   Recall 3   Language- repeat 1 1        08/16/2022    7:24 PM  6CIT Screen  What Year? 0 points  What month? 0 points  What time? 0 points  Count back from 20 0 points  Months in reverse 2 points  Repeat phrase 0 points  Total Score 2 points    Immunizations Immunization History  Administered Date(s) Administered   COVID-19, mRNA, vaccine(Comirnaty)12 years and older 04/29/2022   Fluad Quad(high Dose 65+)  11/26/2018, 04/29/2022   Influenza, High Dose Seasonal PF 12/17/2016, 10/28/2017, 01/05/2021   Influenza,inj,Quad PF,6+ Mos 10/25/2014   PFIZER(Purple Top)SARS-COV-2 Vaccination 04/02/2019, 04/28/2019, 11/15/2019   Pfizer Covid-19 Vaccine Bivalent Booster 11yrs & up 05/18/2021   Pneumococcal Conjugate-13 11/26/2018   Pneumococcal Polysaccharide-23 07/15/2013   Tdap 04/13/2013    TDAP status: Up to date  Pneumococcal vaccine status: Up to date  Covid-19 vaccine status: Information provided on how to obtain vaccines.   Qualifies for Shingles Vaccine? Yes   Zostavax completed No   Shingrix Completed?: No.    Education has been provided regarding the importance of this vaccine. Patient has been advised to call insurance company to determine out of pocket expense if they have not yet received this vaccine. Advised may also receive vaccine at local pharmacy or Health Dept. Verbalized acceptance and understanding.  Screening Tests Health Maintenance  Topic Date Due   Zoster Vaccines- Shingrix (1 of 2) Never done   COVID-19 Vaccine (6 - 2023-24 season) 06/24/2022   Diabetic kidney evaluation - Urine ACR  02/05/2028 (Originally 11/26/1968)   FOOT EXAM  02/05/2028 (Originally 11/26/1960)   OPHTHALMOLOGY EXAM  02/05/2028 (Originally 11/26/1960)   INFLUENZA VACCINE  09/05/2022   HEMOGLOBIN A1C  10/13/2022   DTaP/Tdap/Td (2 - Td or Tdap) 04/14/2023   Diabetic kidney evaluation - eGFR measurement  05/23/2023   Medicare Annual Wellness (AWV)  08/16/2023   Pneumonia Vaccine 48+ Years old (3 of 3 - PPSV23 or PCV20) 11/26/2023   Colonoscopy  11/29/2031   Hepatitis C Screening  Completed   HPV VACCINES  Aged Out    Health Maintenance  Health Maintenance Due  Topic Date Due   Zoster Vaccines- Shingrix (1 of 2) Never done   COVID-19 Vaccine (6 - 2023-24 season) 06/24/2022    Colorectal cancer screening: Type of screening: Colonoscopy. Completed 11/28/21. Repeat every 10 years  Lung Cancer  Screening: (Low Dose CT Chest recommended if Age 13-80 years, 20 pack-year currently smoking OR have quit w/in 15years.) does not qualify.   Lung Cancer Screening Referral: na/  Additional Screening:  Hepatitis C Screening: does qualify; Completed 09/03/14  Vision Screening: Recommended annual ophthalmology exams for early detection  of glaucoma and other disorders of the eye. Is the patient up to date with their annual eye exam?  Yes  Who is the provider or what is the name of the office in which the patient attends annual eye exams? Unable to provide name If pt is not established with a provider, would they like to be referred to a provider to establish care? No .   Dental Screening: Recommended annual dental exams for proper oral hygiene  Diabetic Foot Exam: Diabetic Foot Exam: Overdue, Pt has been advised about the importance in completing this exam. Pt is scheduled for diabetic foot exam on at next office visit.  Community Resource Referral / Chronic Care Management: CRR required this visit?  No   CCM required this visit?  No     Plan:     I have personally reviewed and noted the following in the patient's chart:   Medical and social history Use of alcohol, tobacco or illicit drugs  Current medications and supplements including opioid prescriptions. Patient is not currently taking opioid prescriptions. Functional ability and status Nutritional status Physical activity Advanced directives List of other physicians Hospitalizations, surgeries, and ER visits in previous 12 months Vitals Screenings to include cognitive, depression, and falls Referrals and appointments  In addition, I have reviewed and discussed with patient certain preventive protocols, quality metrics, and best practice recommendations. A written personalized care plan for preventive services as well as general preventive health recommendations were provided to patient.     Kandis Fantasia [place],  [place]   07/09/5407   After Visit Summary: (Mail) Due to this being a telephonic visit, the after visit summary with patients personalized plan was offered to patient via mail   Nurse Notes: No concerns     I have reviewed this visit and agree with the documentation.  Marshall L Chambliss

## 2022-08-16 NOTE — Patient Instructions (Addendum)
Philip Chavez , Thank you for taking time to come for your Medicare Wellness Visit. I appreciate your ongoing commitment to your health goals. Please review the following plan we discussed and let me know if I can assist you in the future.   These are the goals we discussed:  Goals      Exercise 3x per week (30 min per time)     Wants to keep walking the 30 minutes a day.      Remain active and independent        This is a list of the screening recommended for you and due dates:  Health Maintenance  Topic Date Due   Zoster (Shingles) Vaccine (1 of 2) Never done   COVID-19 Vaccine (6 - 2023-24 season) 06/24/2022   Yearly kidney health urinalysis for diabetes  02/05/2028*   Complete foot exam   02/05/2028*   Eye exam for diabetics  02/05/2028*   Flu Shot  09/05/2022   Hemoglobin A1C  10/13/2022   DTaP/Tdap/Td vaccine (2 - Td or Tdap) 04/14/2023   Yearly kidney function blood test for diabetes  05/23/2023   Medicare Annual Wellness Visit  08/16/2023   Pneumonia Vaccine (3 of 3 - PPSV23 or PCV20) 11/26/2023   Colon Cancer Screening  11/29/2031   Hepatitis C Screening  Completed   HPV Vaccine  Aged Out  *Topic was postponed. The date shown is not the original due date.    Advanced directives: Information on Advanced Care Planning can be found at [facility] Advance Health Care Directives Advance Health Care Directives ([email or web address] Please bring a copy of your health care power of attorney and living will to the office to be added to your chart at your convenience.  Conditions/risks identified: Aim for 30 minutes of exercise or brisk walking, 6-8 glasses of water, and 5 servings of fruits and vegetables each day.  Next appointment: Follow up in one year for your annual wellness visit.   Preventive Care 72 Years and Older, Male  Preventive care refers to lifestyle choices and visits with your health care provider that can promote health and wellness. What does  preventive care include? A yearly physical exam. This is also called an annual well check. Dental exams once or twice a year. Routine eye exams. Ask your health care provider how often you should have your eyes checked. Personal lifestyle choices, including: Daily care of your teeth and gums. Regular physical activity. Eating a healthy diet. Avoiding tobacco and drug use. Limiting alcohol use. Practicing safe sex. Taking low doses of aspirin every day. Taking vitamin and mineral supplements as recommended by your health care provider. What happens during an annual well check? The services and screenings done by your health care provider during your annual well check will depend on your age, overall health, lifestyle risk factors, and family history of disease. Counseling  Your health care provider may ask you questions about your: Alcohol use. Tobacco use. Drug use. Emotional well-being. Home and relationship well-being. Sexual activity. Eating habits. History of falls. Memory and ability to understand (cognition). Work and work Astronomer. Screening  You may have the following tests or measurements: Height, weight, and BMI. Blood pressure. Lipid and cholesterol levels. These may be checked every 5 years, or more frequently if you are over 37 years old. Skin check. Lung cancer screening. You may have this screening every year starting at age 34 if you have a 30-pack-year history of smoking and currently smoke or  have quit within the past 15 years. Fecal occult blood test (FOBT) of the stool. You may have this test every year starting at age 28. Flexible sigmoidoscopy or colonoscopy. You may have a sigmoidoscopy every 5 years or a colonoscopy every 10 years starting at age 41. Prostate cancer screening. Recommendations will vary depending on your family history and other risks. Hepatitis C blood test. Hepatitis B blood test. Sexually transmitted disease (STD) testing. Diabetes  screening. This is done by checking your blood sugar (glucose) after you have not eaten for a while (fasting). You may have this done every 1-3 years. Abdominal aortic aneurysm (AAA) screening. You may need this if you are a current or former smoker. Osteoporosis. You may be screened starting at age 28 if you are at high risk. Talk with your health care provider about your test results, treatment options, and if necessary, the need for more tests. Vaccines  Your health care provider may recommend certain vaccines, such as: Influenza vaccine. This is recommended every year. Tetanus, diphtheria, and acellular pertussis (Tdap, Td) vaccine. You may need a Td booster every 10 years. Zoster vaccine. You may need this after age 52. Pneumococcal 13-valent conjugate (PCV13) vaccine. One dose is recommended after age 36. Pneumococcal polysaccharide (PPSV23) vaccine. One dose is recommended after age 35. Talk to your health care provider about which screenings and vaccines you need and how often you need them. This information is not intended to replace advice given to you by your health care provider. Make sure you discuss any questions you have with your health care provider. Document Released: 02/17/2015 Document Revised: 10/11/2015 Document Reviewed: 11/22/2014 Elsevier Interactive Patient Education  2017 [employer].  Fall Prevention in the Home Falls can cause injuries. They can happen to people of all ages. There are many things you can do to make your home safe and to help prevent falls. What can I do on the outside of my home? Regularly fix the edges of walkways and driveways and fix any cracks. Remove anything that might make you trip as you walk through a door, such as a raised step or threshold. Trim any bushes or trees on the path to your home. Use bright outdoor lighting. Clear any walking paths of anything that might make someone trip, such as rocks or tools. Regularly check to see if  handrails are loose or broken. Make sure that both sides of any steps have handrails. Any raised decks and porches should have guardrails on the edges. Have any leaves, snow, or ice cleared regularly. Use sand or salt on walking paths during winter. Clean up any spills in your garage right away. This includes oil or grease spills. What can I do in the bathroom? Use night lights. Install grab bars by the toilet and in the tub and shower. Do not use towel bars as grab bars. Use non-skid mats or decals in the tub or shower. If you need to sit down in the shower, use a plastic, non-slip stool. Keep the floor dry. Clean up any water that spills on the floor as soon as it happens. Remove soap buildup in the tub or shower regularly. Attach bath mats securely with double-sided non-slip rug tape. Do not have throw rugs and other things on the floor that can make you trip. What can I do in the bedroom? Use night lights. Make sure that you have a light by your bed that is easy to reach. Do not use any sheets or blankets that  are too big for your bed. They should not hang down onto the floor. Have a firm chair that has side arms. You can use this for support while you get dressed. Do not have throw rugs and other things on the floor that can make you trip. What can I do in the kitchen? Clean up any spills right away. Avoid walking on wet floors. Keep items that you use a lot in easy-to-reach places. If you need to reach something above you, use a strong step stool that has a grab bar. Keep electrical cords out of the way. Do not use floor polish or wax that makes floors slippery. If you must use wax, use non-skid floor wax. Do not have throw rugs and other things on the floor that can make you trip. What can I do with my stairs? Do not leave any items on the stairs. Make sure that there are handrails on both sides of the stairs and use them. Fix handrails that are broken or loose. Make sure that  handrails are as long as the stairways. Check any carpeting to make sure that it is firmly attached to the stairs. Fix any carpet that is loose or worn. Avoid having throw rugs at the top or bottom of the stairs. If you do have throw rugs, attach them to the floor with carpet tape. Make sure that you have a light switch at the top of the stairs and the bottom of the stairs. If you do not have them, ask someone to add them for you. What else can I do to help prevent falls? Wear shoes that: Do not have high heels. Have rubber bottoms. Are comfortable and fit you well. Are closed at the toe. Do not wear sandals. If you use a stepladder: Make sure that it is fully opened. Do not climb a closed stepladder. Make sure that both sides of the stepladder are locked into place. Ask someone to hold it for you, if possible. Clearly mark and make sure that you can see: Any grab bars or handrails. First and last steps. Where the edge of each step is. Use tools that help you move around (mobility aids) if they are needed. These include: Canes. Walkers. Scooters. Crutches. Turn on the lights when you go into a dark area. Replace any light bulbs as soon as they burn out. Set up your furniture so you have a clear path. Avoid moving your furniture around. If any of your floors are uneven, fix them. If there are any pets around you, be aware of where they are. Review your medicines with your doctor. Some medicines can make you feel dizzy. This can increase your chance of falling. Ask your doctor what other things that you can do to help prevent falls. This information is not intended to replace advice given to you by your health care provider. Make sure you discuss any questions you have with your health care provider. Document Released: 11/17/2008 Document Revised: 06/29/2015 Document Reviewed: 02/25/2014 Elsevier Interactive Patient Education  2017 [employer].

## 2022-08-19 ENCOUNTER — Ambulatory Visit: Payer: Medicare PPO | Attending: Internal Medicine

## 2022-08-19 DIAGNOSIS — I5022 Chronic systolic (congestive) heart failure: Secondary | ICD-10-CM

## 2022-08-19 DIAGNOSIS — Z95810 Presence of automatic (implantable) cardiac defibrillator: Secondary | ICD-10-CM | POA: Diagnosis not present

## 2022-08-21 NOTE — Progress Notes (Signed)
EPIC Encounter for ICM Monitoring  Patient Name: Philip Chavez is a 72 y.o. male Date: 08/21/2022 Primary Care Physican: Shelby Mattocks, DO Primary Cardiologist: Shirlee Latch Electrophysiologist: Ladona Ridgel BiV Pacing:  99.5%        07/26/2021 Weight: 200 lbs 09/05/2021 Weight: 200 lbs 04/01/2021 Office Weight: 195 lbs 05/08/2022 Weight: 195 lbs 06/12/2022 Weight: 195 lbs   Clinical Status  Since 29-Jul-2022 Time in AT/AF  0.0 hours/day (0.0 %)           Spoke with patient and heart failure questions reviewed.  Transmission results reviewed.  Pt asymptomatic for fluid accumulation.  Reports feeling well at this time and voices no complaints.     OptiVol Thoracic impedance suggesting normal fluid levels within the last month.    Prescribed:  Torsemide 20 mg 1 tablet by mouth once a day.   Confirmed on 07/15/2022 he stopped taking Torsemide several months ago due to causing urinary incontinence.  Potassium 10 mEq take 1 tablet by mouth twice a day Spironolactone 25 mg take 1 tablet by mouth once a day   Labs: 05/23/2022 Creatinine 1.44, BUN 12, Potassium 3.9, Sodium 136, GFR 52  05/22/2022 Creatinine 1.56, BUN 13, Potassium 3.9, Sodium 135, GFR 47  04/12/2022 Creatinine 1.38, BUN 17, Potassium 4.5, Sodium 140, GFR 55 04/01/2022 Creatinine 1.36, BUN 17, Potassium 4.2, Sodium 136, GFR 56  02/12/2022 Creatinine 1.39, BUN 14, Potassium 4.1, Sodium 135, GFR 54  02/11/2022 Creatinine 1.19, BUN 10, Potassium 4.2, Sodium 137, GFR >60  A complete set of results can be found in Results Review.   Recommendations:  No changes and encouraged to call if experiencing any fluid symptoms.    Follow-up plan: ICM clinic phone appointment on 09/23/2022.   91 day device clinic remote transmission 11/22/2022.     EP/Cardiology Office Visits:  Recall 11/04/2022 with Dr Ladona Ridgel.   Recall 06/30/2022 with Dr Shirlee Latch.  Advised to call Dr Alford Highland office to schedule appointment.    Copy of ICM check sent to Dr. Ladona Ridgel.    3  month ICM trend: 08/20/2022.    12-14 Month ICM trend:     Karie Soda, RN 08/21/2022 2:04 PM

## 2022-08-23 ENCOUNTER — Ambulatory Visit (INDEPENDENT_AMBULATORY_CARE_PROVIDER_SITE_OTHER): Payer: Medicare PPO

## 2022-08-23 DIAGNOSIS — I428 Other cardiomyopathies: Secondary | ICD-10-CM | POA: Diagnosis not present

## 2022-08-27 LAB — CUP PACEART REMOTE DEVICE CHECK
Battery Remaining Longevity: 122 mo
Battery Voltage: 3.02 V
Brady Statistic RV Percent Paced: 0 %
Date Time Interrogation Session: 20240719225340
HighPow Impedance: 50 Ohm
Lead Channel Impedance Value: 247 Ohm
Lead Channel Impedance Value: 323 Ohm
Lead Channel Impedance Value: 361 Ohm
Lead Channel Impedance Value: 380 Ohm
Lead Channel Impedance Value: 399 Ohm
Lead Channel Impedance Value: 741 Ohm
Lead Channel Pacing Threshold Amplitude: 0.5 V
Lead Channel Pacing Threshold Pulse Width: 0.4 ms
Lead Channel Sensing Intrinsic Amplitude: 14.3 mV
Lead Channel Sensing Intrinsic Amplitude: 2.9 mV
Lead Channel Setting Pacing Amplitude: 2 V
Lead Channel Setting Pacing Amplitude: 2 V
Lead Channel Setting Pacing Pulse Width: 0.4 ms
Lead Channel Setting Sensing Sensitivity: 0.3 mV
Zone Setting Status: 755011
Zone Setting Status: 755011
Zone Setting Status: 755011

## 2022-09-05 NOTE — Progress Notes (Signed)
Remote ICD transmission.   

## 2022-09-10 ENCOUNTER — Other Ambulatory Visit (HOSPITAL_COMMUNITY): Payer: Self-pay

## 2022-09-11 ENCOUNTER — Other Ambulatory Visit (HOSPITAL_COMMUNITY): Payer: Self-pay

## 2022-09-13 ENCOUNTER — Other Ambulatory Visit (HOSPITAL_COMMUNITY): Payer: Self-pay

## 2022-09-19 ENCOUNTER — Other Ambulatory Visit (HOSPITAL_COMMUNITY): Payer: Self-pay
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
## 2022-09-23 ENCOUNTER — Other Ambulatory Visit (HOSPITAL_COMMUNITY): Payer: Self-pay

## 2022-09-23 ENCOUNTER — Ambulatory Visit: Payer: Medicare PPO | Attending: Internal Medicine

## 2022-09-23 ENCOUNTER — Ambulatory Visit (HOSPITAL_COMMUNITY)
Admission: RE | Admit: 2022-09-23 | Discharge: 2022-09-23 | Disposition: A | Discharge status: Home / Self Care | Payer: Medicare PPO | Source: Ambulatory Visit | Attending: Family Medicine | Admitting: Family Medicine

## 2022-09-23 ENCOUNTER — Encounter (HOSPITAL_COMMUNITY): Payer: Self-pay

## 2022-09-23 VITALS — BP 108/60 | HR 63 | Wt 193.6 lb

## 2022-09-23 DIAGNOSIS — I472 Ventricular tachycardia, unspecified: Secondary | ICD-10-CM | POA: Insufficient documentation

## 2022-09-23 DIAGNOSIS — Z79899 Other long term (current) drug therapy: Secondary | ICD-10-CM | POA: Insufficient documentation

## 2022-09-23 DIAGNOSIS — Z7901 Long term (current) use of anticoagulants: Secondary | ICD-10-CM | POA: Insufficient documentation

## 2022-09-23 DIAGNOSIS — N1831 Chronic kidney disease, stage 3a: Secondary | ICD-10-CM | POA: Diagnosis not present

## 2022-09-23 DIAGNOSIS — R251 Tremor, unspecified: Secondary | ICD-10-CM | POA: Insufficient documentation

## 2022-09-23 DIAGNOSIS — I442 Atrioventricular block, complete: Secondary | ICD-10-CM | POA: Insufficient documentation

## 2022-09-23 DIAGNOSIS — Z8249 Family history of ischemic heart disease and other diseases of the circulatory system: Secondary | ICD-10-CM | POA: Insufficient documentation

## 2022-09-23 DIAGNOSIS — Z95810 Presence of automatic (implantable) cardiac defibrillator: Secondary | ICD-10-CM

## 2022-09-23 DIAGNOSIS — E785 Hyperlipidemia, unspecified: Secondary | ICD-10-CM | POA: Insufficient documentation

## 2022-09-23 DIAGNOSIS — I428 Other cardiomyopathies: Secondary | ICD-10-CM | POA: Insufficient documentation

## 2022-09-23 DIAGNOSIS — I5022 Chronic systolic (congestive) heart failure: Secondary | ICD-10-CM

## 2022-09-23 DIAGNOSIS — Z86711 Personal history of pulmonary embolism: Secondary | ICD-10-CM

## 2022-09-23 DIAGNOSIS — I4892 Unspecified atrial flutter: Secondary | ICD-10-CM | POA: Diagnosis not present

## 2022-09-23 DIAGNOSIS — I480 Paroxysmal atrial fibrillation: Secondary | ICD-10-CM | POA: Diagnosis not present

## 2022-09-23 DIAGNOSIS — I2510 Atherosclerotic heart disease of native coronary artery without angina pectoris: Secondary | ICD-10-CM | POA: Insufficient documentation

## 2022-09-23 LAB — CBC
HCT: 47.5 % (ref 39.0–52.0)
Hemoglobin: 15.5 g/dL (ref 13.0–17.0)
MCH: 33 pg (ref 26.0–34.0)
MCHC: 32.6 g/dL (ref 30.0–36.0)
MCV: 101.3 fL — ABNORMAL HIGH (ref 80.0–100.0)
Platelets: 171 10*3/uL (ref 150–400)
RBC: 4.69 MIL/uL (ref 4.22–5.81)
RDW: 14.6 % (ref 11.5–15.5)
WBC: 4.4 10*3/uL (ref 4.0–10.5)
nRBC: 0 % (ref 0.0–0.2)

## 2022-09-23 LAB — COMPREHENSIVE METABOLIC PANEL
ALT: 22 U/L (ref 0–44)
AST: 26 U/L (ref 15–41)
Albumin: 4 g/dL (ref 3.5–5.0)
Alkaline Phosphatase: 74 U/L (ref 38–126)
Anion gap: 11 (ref 5–15)
BUN: 17 mg/dL (ref 8–23)
CO2: 24 mmol/L (ref 22–32)
Calcium: 9.3 mg/dL (ref 8.9–10.3)
Chloride: 102 mmol/L (ref 98–111)
Creatinine, Ser: 1.8 mg/dL — ABNORMAL HIGH (ref 0.61–1.24)
GFR, Estimated: 40 mL/min — ABNORMAL LOW (ref >60)
Glucose, Bld: 112 mg/dL — ABNORMAL HIGH (ref 70–99)
Potassium: 4.2 mmol/L (ref 3.5–5.1)
Sodium: 137 mmol/L (ref 135–145)
Total Bilirubin: 0.3 mg/dL (ref 0.3–1.2)
Total Protein: 7.5 g/dL (ref 6.5–8.1)

## 2022-09-23 LAB — BRAIN NATRIURETIC PEPTIDE: B Natriuretic Peptide: 569.4 pg/mL — ABNORMAL HIGH (ref 0.0–100.0)

## 2022-09-23 LAB — TSH: TSH: 0.865 u[IU]/mL (ref 0.350–4.500)

## 2022-09-23 LAB — DIGOXIN LEVEL: Digoxin Level: 0.4 ng/mL — ABNORMAL LOW (ref 0.8–2.0)

## 2022-09-23 MED ORDER — BISOPROLOL FUMARATE 5 MG PO TABS
5.0000 mg | ORAL_TABLET | Freq: Every day | ORAL | 3 refills | Status: DC
  Filled 2022-09-23: qty 90, 90d supply, fill #0
  Filled 2022-12-28: qty 90, 90d supply, fill #1
  Filled 2023-03-29: qty 90, 90d supply, fill #2
  Filled 2023-07-06: qty 90, 90d supply, fill #3

## 2022-09-23 NOTE — Patient Instructions (Addendum)
EKG done today.  Labs done today. We will contact you only if your labs are abnormal.  STOP taking Propranolol  START Bisoprolol 5mg  (1 tablet) by mouth daily.   No medication changes were made. Please continue all current medications as prescribed.  Your physician recommends that you schedule a follow-up appointment in: soon for an echo and in 3 months with Dr. Shirlee Latch.   Your physician has requested that you have an echocardiogram. Echocardiography is a painless test that uses sound waves to create images of your heart. It provides your doctor with information about the size and shape of your heart and how well your heart's chambers and valves are working. This procedure takes approximately one hour. There are no restrictions for this procedure. Please do NOT wear cologne, perfume, aftershave, or lotions (deodorant is allowed). Please arrive 15 minutes prior to your appointment time.  If you have any questions or concerns before your next appointment please send [place] a message through [facility] or call our office at [phone].    TO LEAVE A MESSAGE FOR THE NURSE SELECT OPTION 2, PLEASE LEAVE A MESSAGE INCLUDING: YOUR NAME DATE OF BIRTH CALL BACK NUMBER REASON FOR CALL**this is important as we prioritize the call backs  YOU WILL RECEIVE A CALL BACK THE SAME DAY AS LONG AS YOU CALL BEFORE 4:00 PM   Do the following things EVERYDAY: Weigh yourself in the morning before breakfast. Write it down and keep it in a log. Take your medicines as prescribed Eat low salt foods--Limit salt (sodium) to 2000 mg per day.  Stay as active as you can everyday Limit all fluids for the day to less than 2 liters   At [facility], you and your health needs are our priority. As part of our continuing mission to provide you with exceptional heart care, we have created designated Provider Care Teams. These Care Teams include your primary Cardiologist (physician) and Advanced Practice  Providers (APPs- Physician Assistants and Nurse Practitioners) who all work together to provide you with the care you need, when you need it.   You may see any of the following providers on your designated Care Team at your next follow up: Dr Arvilla Meres Dr Marca Ancona Dr. Marcos Eke, NP Robbie Lis, [place] [facility] [place], [place] Brynda Peon, NP Karle Plumber, PharmD   Please be sure to bring in all your medications bottles to every appointment.    Thank you for choosing [facility]

## 2022-09-24 ENCOUNTER — Telehealth (HOSPITAL_COMMUNITY): Payer: Self-pay

## 2022-09-24 DIAGNOSIS — I5022 Chronic systolic (congestive) heart failure: Secondary | ICD-10-CM

## 2022-09-24 NOTE — Progress Notes (Signed)
EPIC Encounter for ICM Monitoring  Patient Name: Philip Chavez is a 72 y.o. male Date: 09/24/2022 Primary Care Physican: Shelby Mattocks, DO Primary Cardiologist: Shirlee Latch Electrophysiologist: Ladona Ridgel BiV Pacing:  99.5%        07/26/2021 Weight: 200 lbs 09/05/2021 Weight: 200 lbs 04/01/2021 Office Weight: 195 lbs 05/08/2022 Weight: 195 lbs 06/12/2022 Weight: 195 lbs   Clinical Status  Since 23-Aug-2022 Time in AT/AF  0.0 hours/day (0.0 %)           Transmission results reviewed.     OptiVol Thoracic impedance suggesting normal fluid levels within the last month.    Prescribed:  Torsemide 20 mg 1 tablet by mouth once a day.   Takes PRN due to causing urinary incontinence.  Potassium 10 mEq take 1 tablet by mouth twice a day Spironolactone 25 mg take 1 tablet by mouth once a day   Labs: 09/22/2022 Creatinine 1.80, BUN 17, Potassium 4.2, Sodium 137, GFR 40 05/23/2022 Creatinine 1.44, BUN 12, Potassium 3.9, Sodium 136, GFR 52  05/22/2022 Creatinine 1.56, BUN 13, Potassium 3.9, Sodium 135, GFR 47  04/12/2022 Creatinine 1.38, BUN 17, Potassium 4.5, Sodium 140, GFR 55 04/01/2022 Creatinine 1.36, BUN 17, Potassium 4.2, Sodium 136, GFR 56  02/12/2022 Creatinine 1.39, BUN 14, Potassium 4.1, Sodium 135, GFR 54  02/11/2022 Creatinine 1.19, BUN 10, Potassium 4.2, Sodium 137, GFR >60  A complete set of results can be found in Results Review.   Recommendations:  No changes.  Recommendations given at HF clinic 8/19 OV.     Follow-up plan: ICM clinic phone appointment on 10/28/2022.   91 day device clinic remote transmission 11/22/2022.     EP/Cardiology Office Visits:  Recall 11/04/2022 with Dr Ladona Ridgel.   Recall 06/30/2022 with Dr Shirlee Latch.  Advised to call Dr Alford Highland office to schedule appointment.    Copy of ICM check sent to Dr. Ladona Ridgel.   3 month ICM trend: 09/23/2022.    12-14 Month ICM trend:     Karie Soda, RN 09/24/2022 8:35 AM

## 2022-09-24 NOTE — Telephone Encounter (Signed)
-----   Message from [place] sent at 09/24/2022 10:42 AM EDT ----- BNP elevated, kidney function reduced from baseline.  He takes 10 KCL bid.  Please take torsemide 20 mg daily x 3 days (with extra 10 meq of KCL x 3 days), then can use PRN thereafter.    Repeat BMET in 2 weeks to follow kidney function

## 2022-09-24 NOTE — Telephone Encounter (Signed)
Patient advised and verbalized understanding,lab appointment scheduled,lab orders entered.  Orders Placed This Encounter  Procedures   Basic metabolic panel    Standing Status:   Future    Standing Expiration Date:   09/24/2023    Order Specific Question:   Release to patient    Answer:   Immediate    Order Specific Question:   Release to patient    Answer:   Immediate [1]

## 2022-10-01 ENCOUNTER — Other Ambulatory Visit (HOSPITAL_COMMUNITY): Payer: Self-pay

## 2022-10-09 ENCOUNTER — Ambulatory Visit (HOSPITAL_COMMUNITY)
Admission: RE | Admit: 2022-10-09 | Discharge: 2022-10-09 | Disposition: A | Discharge status: Home / Self Care | Payer: Medicare PPO | Source: Ambulatory Visit

## 2022-10-09 ENCOUNTER — Ambulatory Visit (HOSPITAL_COMMUNITY)
Admission: RE | Admit: 2022-10-09 | Discharge: 2022-10-09 | Disposition: A | Discharge status: Home / Self Care | Payer: Medicare PPO | Source: Ambulatory Visit | Attending: Diagnostic Radiology | Admitting: Diagnostic Radiology

## 2022-10-09 DIAGNOSIS — I2699 Other pulmonary embolism without acute cor pulmonale: Secondary | ICD-10-CM | POA: Diagnosis not present

## 2022-10-09 DIAGNOSIS — I4892 Unspecified atrial flutter: Secondary | ICD-10-CM | POA: Diagnosis not present

## 2022-10-09 DIAGNOSIS — I4901 Ventricular fibrillation: Secondary | ICD-10-CM | POA: Insufficient documentation

## 2022-10-09 DIAGNOSIS — I083 Combined rheumatic disorders of mitral, aortic and tricuspid valves: Secondary | ICD-10-CM | POA: Diagnosis not present

## 2022-10-09 DIAGNOSIS — I5022 Chronic systolic (congestive) heart failure: Secondary | ICD-10-CM | POA: Diagnosis not present

## 2022-10-09 DIAGNOSIS — I428 Other cardiomyopathies: Secondary | ICD-10-CM | POA: Insufficient documentation

## 2022-10-09 LAB — BASIC METABOLIC PANEL
Anion gap: 10 (ref 5–15)
BUN: 16 mg/dL (ref 8–23)
CO2: 23 mmol/L (ref 22–32)
Calcium: 9 mg/dL (ref 8.9–10.3)
Chloride: 103 mmol/L (ref 98–111)
Creatinine, Ser: 1.45 mg/dL — ABNORMAL HIGH (ref 0.61–1.24)
GFR, Estimated: 52 mL/min — ABNORMAL LOW (ref >60)
Glucose, Bld: 108 mg/dL — ABNORMAL HIGH (ref 70–99)
Potassium: 4.3 mmol/L (ref 3.5–5.1)
Sodium: 136 mmol/L (ref 135–145)

## 2022-10-10 ENCOUNTER — Other Ambulatory Visit (HOSPITAL_COMMUNITY): Payer: Self-pay

## 2022-10-10 LAB — ECHOCARDIOGRAM COMPLETE
Area-P 1/2: 5.2 cm2
Est EF: <20
P 1/2 time: 380 ms
S' Lateral: 6.65 cm

## 2022-10-14 ENCOUNTER — Other Ambulatory Visit (HOSPITAL_COMMUNITY): Payer: Self-pay

## 2022-10-14 ENCOUNTER — Telehealth (HOSPITAL_COMMUNITY): Payer: Self-pay | Admitting: *Deleted

## 2022-10-14 NOTE — Telephone Encounter (Signed)
Called patient per Dr. Shirlee Latch with following echo results:  "EF unchanged, < 20%, with moderate RV dysfunction"  Pt verbalized understanding of same. Confirmed f/u appointment with Dr. Shirlee Latch 12/25/22. Asked patient to call us at [phone] if any questions or concerns prior to next appointment.

## 2022-10-16 DIAGNOSIS — H524 Presbyopia: Secondary | ICD-10-CM | POA: Diagnosis not present

## 2022-10-16 DIAGNOSIS — H52223 Regular astigmatism, bilateral: Secondary | ICD-10-CM | POA: Diagnosis not present

## 2022-10-16 DIAGNOSIS — H40013 Open angle with borderline findings, low risk, bilateral: Secondary | ICD-10-CM | POA: Diagnosis not present

## 2022-10-16 DIAGNOSIS — H5203 Hypermetropia, bilateral: Secondary | ICD-10-CM | POA: Diagnosis not present

## 2022-10-22 ENCOUNTER — Encounter: Payer: Self-pay | Admitting: Internal Medicine

## 2022-10-28 ENCOUNTER — Ambulatory Visit: Payer: Medicare PPO | Attending: Internal Medicine

## 2022-10-28 DIAGNOSIS — Z95810 Presence of automatic (implantable) cardiac defibrillator: Secondary | ICD-10-CM

## 2022-10-28 DIAGNOSIS — I5022 Chronic systolic (congestive) heart failure: Secondary | ICD-10-CM | POA: Diagnosis not present

## 2022-10-31 NOTE — Progress Notes (Signed)
EPIC Encounter for ICM Monitoring  Patient Name: Philip Chavez is a 72 y.o. male Date: 10/31/2022 Primary Care Physican: Shelby Mattocks, DO Primary Cardiologist: Shirlee Latch Electrophysiologist: Ladona Ridgel BiV Pacing:  99.5%        07/26/2021 Weight: 200 lbs 09/05/2021 Weight: 200 lbs 04/01/2021 Office Weight: 195 lbs 05/08/2022 Weight: 195 lbs 06/12/2022 Weight: 195 lbs 10/31/2022 Weight: 196 lbs   Clinical Status  Since 27-Sep-2022 Time in AT/AF  0.0 hours/day (0.0 %)            Spoke with patient and heart failure questions reviewed.  Transmission results reviewed.  Pt asymptomatic for fluid accumulation.  Reports feeling well at this time and voices no complaints.     OptiVol Thoracic impedance suggesting normal fluid levels within the last month.    Prescribed:  Torsemide 20 mg 1 tablet by mouth once a day.   Takes PRN due to causing urinary incontinence.  Potassium 10 mEq take 1 tablet by mouth twice a day Spironolactone 25 mg take 1 tablet by mouth once a day   Labs: 10/09/2022 Creatinine 1.45, BUN 16, Potassium 4.3, Sodium 136, GFR 52 09/23/2022 Creatinine 1.80, BUN 17, Potassium 4.2, Sodium 137, GFR 40 05/23/2022 Creatinine 1.44, BUN 12, Potassium 3.9, Sodium 136, GFR 52  05/22/2022 Creatinine 1.56, BUN 13, Potassium 3.9, Sodium 135, GFR 47  04/12/2022 Creatinine 1.38, BUN 17, Potassium 4.5, Sodium 140, GFR 55 04/01/2022 Creatinine 1.36, BUN 17, Potassium 4.2, Sodium 136, GFR 56  02/12/2022 Creatinine 1.39, BUN 14, Potassium 4.1, Sodium 135, GFR 54  02/11/2022 Creatinine 1.19, BUN 10, Potassium 4.2, Sodium 137, GFR >60  A complete set of results can be found in Results Review.   Recommendations:  No changes and encouraged to call if experiencing any fluid symptoms.   Follow-up plan: ICM clinic phone appointment on 12/02/2022.   91 day device clinic remote transmission 11/22/2022.     EP/Cardiology Office Visits:  Advised to call EP scheduler for appt with Dr Ladona Ridgel.  Recall 11/04/2022  with Dr Ladona Ridgel.   12/25/2022 with Dr Shirlee Latch.      Copy of ICM check sent to Dr. Ladona Ridgel.   3 month ICM trend: 10/27/2022.    12-14 Month ICM trend:     Karie Soda, RN 10/31/2022 7:51 AM

## 2022-11-07 ENCOUNTER — Other Ambulatory Visit (HOSPITAL_COMMUNITY): Payer: Self-pay | Admitting: Cardiology

## 2022-11-08 ENCOUNTER — Other Ambulatory Visit (HOSPITAL_COMMUNITY): Payer: Self-pay

## 2022-11-08 MED ORDER — SPIRONOLACTONE 25 MG PO TABS
25.0000 mg | ORAL_TABLET | Freq: Every day | ORAL | 1 refills | Status: DC
  Filled 2022-11-08: qty 90, 90d supply, fill #0
  Filled 2023-02-07: qty 90, 90d supply, fill #1

## 2022-11-08 MED ORDER — ATORVASTATIN CALCIUM 20 MG PO TABS
20.0000 mg | ORAL_TABLET | Freq: Every day | ORAL | 1 refills | Status: DC
  Filled 2022-11-08: qty 90, 90d supply, fill #0
  Filled 2023-02-07: qty 90, 90d supply, fill #1

## 2022-11-11 ENCOUNTER — Other Ambulatory Visit (HOSPITAL_COMMUNITY): Payer: Self-pay

## 2022-11-19 ENCOUNTER — Other Ambulatory Visit: Payer: Self-pay | Admitting: Cardiology

## 2022-11-19 ENCOUNTER — Other Ambulatory Visit (HOSPITAL_COMMUNITY): Payer: Self-pay

## 2022-11-19 MED ORDER — ENTRESTO 24-26 MG PO TABS
1.0000 | ORAL_TABLET | Freq: Two times a day (BID) | ORAL | 11 refills | Status: DC
  Filled 2022-11-19: qty 60, 30d supply, fill #0
  Filled 2022-12-20: qty 60, 30d supply, fill #1

## 2022-11-22 ENCOUNTER — Ambulatory Visit (INDEPENDENT_AMBULATORY_CARE_PROVIDER_SITE_OTHER): Payer: Medicare PPO

## 2022-11-22 DIAGNOSIS — I5022 Chronic systolic (congestive) heart failure: Secondary | ICD-10-CM | POA: Diagnosis not present

## 2022-11-22 LAB — CUP PACEART REMOTE DEVICE CHECK
Battery Remaining Longevity: 118 mo
Battery Voltage: 3.01 V
Brady Statistic RV Percent Paced: 0 %
Date Time Interrogation Session: 20241018060648
HighPow Impedance: 50 Ohm
Lead Channel Impedance Value: 247 Ohm
Lead Channel Impedance Value: 323 Ohm
Lead Channel Impedance Value: 361 Ohm
Lead Channel Impedance Value: 361 Ohm
Lead Channel Impedance Value: 380 Ohm
Lead Channel Impedance Value: 703 Ohm
Lead Channel Pacing Threshold Amplitude: 0.5 V
Lead Channel Pacing Threshold Pulse Width: 0.4 ms
Lead Channel Sensing Intrinsic Amplitude: 15.3 mV
Lead Channel Sensing Intrinsic Amplitude: 2.9 mV
Lead Channel Setting Pacing Amplitude: 2 V
Lead Channel Setting Pacing Amplitude: 2 V
Lead Channel Setting Pacing Pulse Width: 0.4 ms
Lead Channel Setting Sensing Sensitivity: 0.3 mV
Zone Setting Status: 755011
Zone Setting Status: 755011
Zone Setting Status: 755011

## 2022-11-25 ENCOUNTER — Ambulatory Visit: Payer: Medicare PPO | Admitting: Internal Medicine

## 2022-11-25 ENCOUNTER — Ambulatory Visit: Payer: Medicare PPO | Attending: Internal Medicine | Admitting: Internal Medicine

## 2022-11-25 ENCOUNTER — Other Ambulatory Visit (HOSPITAL_COMMUNITY): Payer: Self-pay

## 2022-11-25 ENCOUNTER — Encounter: Payer: Self-pay | Admitting: Internal Medicine

## 2022-11-25 VITALS — BP 100/56 | HR 62 | Ht 73.0 in | Wt 198.0 lb

## 2022-11-25 DIAGNOSIS — Z95810 Presence of automatic (implantable) cardiac defibrillator: Secondary | ICD-10-CM | POA: Diagnosis not present

## 2022-11-25 MED ORDER — AMIODARONE HCL 200 MG PO TABS
200.0000 mg | ORAL_TABLET | Freq: Every day | ORAL | 3 refills | Status: DC
Start: 2022-11-25 — End: 2023-12-16
  Filled 2022-11-25: qty 90, fill #0
  Filled 2023-01-07 (×2): qty 90, 90d supply, fill #0
  Filled 2023-04-29: qty 90, 90d supply, fill #1
  Filled 2023-08-22 – 2023-08-25 (×3): qty 90, 90d supply, fill #2

## 2022-11-25 NOTE — Progress Notes (Signed)
HPI [name] returns today for followup. He is a pleasant 72 yo man with a h/o non-ischemic CM, CHB, s/p PPM insertion pacing induced LBBB and subsequent upgrade to a BIV ICD. He then developed ICD lead dislodgement and lost biv pacing. He has had progressively worsening CHF symptoms. He has not had syncope. He denies chest pain. His CHF symptoms are class 3. He has recently had VT/VF storm. He has not had more ICD shocks since DC on amiodarone. He underwent Biv ICD upgrade with removal of his old LV lead and  insertion of a left bundle lead. He no veins in the coronary system. He is stable. He is busy caring for his wife who has dementia.  No Known Allergies   Current Outpatient Medications  Medication Sig Dispense Refill   acetaminophen (TYLENOL) 325 MG tablet Take 650 mg by mouth every 6 (six) hours as needed for moderate pain.     amiodarone (PACERONE) 200 MG tablet Take 1 tablet (200 mg total) by mouth daily. 90 tablet 3   apixaban (ELIQUIS) 5 MG TABS tablet Take 1 tablet (5 mg) by mouth 2 times daily. 180 tablet 3   atorvastatin (LIPITOR) 20 MG tablet Take 1 tablet (20 mg total) by mouth daily. NEEDS FOLLOW UP APPOINTMENT FOR MORE REFILLS 90 tablet 1   bisoprolol (ZEBETA) 5 MG tablet Take 1 tablet (5 mg total) by mouth daily. 90 tablet 3   dapagliflozin propanediol (FARXIGA) 10 MG TABS tablet Take 1 tablet (10 mg total) by mouth daily. 30 tablet 11   diclofenac Sodium (VOLTAREN) 1 % GEL Apply 1 Application topically 4 (four) times daily as needed (pain).     digoxin (LANOXIN) 0.125 MG tablet Take 1/2 tablet (0.0625 mg total) by mouth daily. 15 tablet 6   ferrous sulfate 324 (65 Fe) MG TBEC Take 1 tablet (325 mg total) by mouth daily. 90 tablet 3   ibuprofen (ADVIL) 600 MG tablet Take 1 tablet (600 mg total) by mouth every 8 (eight) hours as needed. 30 tablet 0   Multiple Vitamins-Minerals (CERTAVITE/ANTIOXIDANTS) TABS Take 1 tablet by mouth daily with breakfast.      oxymetazoline (AFRIN) 0.05 % nasal spray Place 1 spray into both nostrils 2 (two) times daily as needed for congestion.     potassium chloride (KLOR-CON) 10 MEQ tablet TAKE 1 TABLET BY MOUTH TWICE A DAY 180 tablet 3   sacubitril-valsartan (ENTRESTO) 24-26 MG Take 1 tablet by mouth 2 (two) times daily. 60 tablet 11   spironolactone (ALDACTONE) 25 MG tablet Take 1 tablet (25 mg total) by mouth daily. 90 tablet 1   torsemide (DEMADEX) 20 MG tablet Take 1 tablet (20 mg total) by mouth daily. (Patient taking differently: Take 20 mg by mouth daily as needed.) 30 tablet 6   tamsulosin (FLOMAX) 0.4 MG CAPS capsule Take 1 capsule (0.4 mg total) by mouth daily. 30 capsule 2   No current facility-administered medications for this visit.     Past Medical History:  Diagnosis Date   AICD (automatic cardioverter/defibrillator) present 0919/2016   BIV    Annual physical exam 06/09/2019   Atrial flutter (HCC)    a. 08/2014 presented w/ aflutter->converted on amio;  b. CHA2DS2VASc = 2 -->Xarelto.   Atrial tachycardia (HCC)    Chronic combined systolic and diastolic CHF (congestive heart failure) (HCC)    a. 03/2013 Echo EF 50-55%;  b. 07/2013 Echo EF 20-25%, diff HK, Gr3 DD;  c. 08/2014 Echo: EF 15%,  diff HK, mild AI/MR, mildly dil LA/RV, mod TR, PASP .   DVT (deep venous thrombosis) (HCC)    a. 2007 RLE: S/P ankle surgery;  b. 03/2013 LLE DVT & PE-->Xarelto.   Embolism, pulmonary with infarction (HCC)    a. 2007 RLE: S/P ankle surgery;  b. 03/2013 LLE DVT & PE-->Xarelto.   Epididymoorchitis 06/03/2022   Flank pain 05/03/2021   Glucosuria 05/03/2021   Hypercholesterolemia    Hypercoagulable state (HCC)    Hypertension    Musculoskeletal neck pain    Nonischemic cardiomyopathy (HCC)    a. 07/2013 Echo: EF 20-25%;  b. 07/2013 Myoview: Large inferior, lateral, apical scar w/ HK, no ischemia, EF 17%;  b. 08/2014 Echo: EF 15%, diff HK.   Pneumonia ~ 04/2014; 09/02/2014   Presence of permanent cardiac  pacemaker    a. 03/2013 s/p MDT ADDRL1 Adapta DC PPM, ser # [id number].   Sleep apnea    does not wear mask (09/02/2014)   Third degree heart block (HCC)    a. 03/2013 s/p MDT ADDRL1 Adapta DC PPM, ser # [id number].   Urinary tract infection with hematuria 06/03/2022    ROS:   All systems reviewed and negative except as noted in the HPI.   Past Surgical History:  Procedure Laterality Date   BIV ICD GENERATOR CHANGEOUT N/A 02/11/2022   Procedure: BIV ICD GENERATOR CHANGEOUT;  Surgeon: Marinus Maw, MD;  Location: [facility] INVASIVE CV LAB;  Service: Cardiovascular;  Laterality: N/A;   CARDIAC CATHETERIZATION N/A 10/17/2014   Procedure: Right/Left Heart Cath and Coronary Angiography;  Surgeon: Laurey Morale, MD;  Location: [facility] INVASIVE CV LAB;  Service: Cardiovascular;  Laterality: N/A;   CARDIOVERSION N/A 07/26/2020   Procedure: CARDIOVERSION;  Surgeon: Jodelle Red, MD;  Location: [facility] ENDOSCOPY;  Service: Cardiovascular;  Laterality: N/A;   COLONOSCOPY WITH PROPOFOL N/A 11/28/2021   Procedure: COLONOSCOPY WITH PROPOFOL;  Surgeon: Charlott Rakes, MD;  Location: [facility] ENDOSCOPY;  Service: Gastroenterology;  Laterality: N/A;   ELECTROPHYSIOLOGIC STUDY N/A 09/07/2014   Procedure: A-Flutter;  Surgeon: Marinus Maw, MD;  Location: [facility] INVASIVE CV LAB;  Service: Cardiovascular;  Laterality: N/A;   EP IMPLANTABLE DEVICE  10/24/2014   BIV   EP IMPLANTABLE DEVICE N/A 10/24/2014   Procedure: BiV ICD Upgrade;  Surgeon: Marinus Maw, MD;  Location: [facility] INVASIVE CV LAB;  Service: Cardiovascular;  Laterality: N/A;   FOOT FRACTURE SURGERY Right 2007   FRACTURE SURGERY     INGUINAL HERNIA REPAIR Right 1980's   INGUINAL HERNIA REPAIR Right 09/22/2020   Procedure: LAPAROSCOPIC RIGHT INGUINAL HERNIA REPAIR WITH MESH;  Surgeon: Axel Filler, MD;  Location: [facility];  Service: General;  Laterality: Right;   INSERT / REPLACE / REMOVE PACEMAKER     MDT ADDRL1 pacemaker implanted by Dr Ladona Ridgel for complete  heart block   LEAD EXTRACTION N/A 02/11/2022   Procedure: LEAD EXTRACTION;  Surgeon: Marinus Maw, MD;  Location: [facility] INVASIVE CV LAB;  Service: Cardiovascular;  Laterality: N/A;   PERMANENT PACEMAKER INSERTION N/A 03/15/2013   Procedure: PERMANENT PACEMAKER INSERTION;  Surgeon: Marinus Maw, MD;  Location: [facility] CATH LAB;  Service: Cardiovascular;  Laterality: N/A;   POLYPECTOMY  11/28/2021   Procedure: POLYPECTOMY;  Surgeon: Charlott Rakes, MD;  Location: [facility] ENDOSCOPY;  Service: Gastroenterology;;   RIGHT HEART CATH N/A 12/05/2021   Procedure: RIGHT HEART CATH;  Surgeon: Laurey Morale, MD;  Location: [facility] INVASIVE CV LAB;  Service: Cardiovascular;  Laterality: N/A;   SCLEROTHERAPY  11/28/2021  Procedure: SCLEROTHERAPY;  Surgeon: Charlott Rakes, MD;  Location: [facility] ENDOSCOPY;  Service: Gastroenterology;;   SUBMUCOSAL TATTOO INJECTION  11/28/2021   Procedure: SUBMUCOSAL TATTOO INJECTION;  Surgeon: Charlott Rakes, MD;  Location: [facility] ENDOSCOPY;  Service: Gastroenterology;;     Family History  Problem Relation Age of Onset   Diabetes type II Mother    Heart disease Father    Arrhythmia Father    Arrhythmia Sister    Hypertension Neg Hx    Heart attack Neg Hx    Stroke Neg Hx    Tremor Neg Hx    Parkinson's disease Neg Hx      Social History   Socioeconomic History   Marital status: Married    Spouse name: Magda Paganini   Number of children: 2   Years of education: Not on file   Highest education level: Not on file  Occupational History   Not on file  Tobacco Use   Smoking status: Former    Current packs/day: 0.00    Average packs/day: 1.5 packs/day for 10.0 years (15.0 ttl pk-yrs)    Types: Cigarettes    Start date: 02/05/1972    Quit date: 02/04/1982    Years since quitting: 40.8    Passive exposure: Past   Smokeless tobacco: Never   Tobacco comments:    09/02/2014  "quit smoking 20-30 yr ago"  Vaping Use   Vaping status: Never Used  Substance and Sexual Activity    Alcohol use: No   Drug use: No   Sexual activity: Yes  Other Topics Concern   Not on file  Social History Narrative   Caffeine: none   Right handed   Social Determinants of Health   Financial Resource Strain: Low Risk  (08/16/2022)   Overall Financial Resource Strain (CARDIA)    Difficulty of Paying Living Expenses: Not hard at all  Food Insecurity: No Food Insecurity (08/16/2022)   Hunger Vital Sign    Worried About Running Out of Food in the Last Year: Never true    Ran Out of Food in the Last Year: Never true  Transportation Needs: No Transportation Needs (08/16/2022)   PRAPARE - Administrator, Civil Service (Medical): No    Lack of Transportation (Non-Medical): No  Physical Activity: Insufficiently Active (08/16/2022)   Exercise Vital Sign    Days of Exercise per Week: 3 days    Minutes of Exercise per Session: 30 min  Stress: No Stress Concern Present (08/16/2022)   [employer] of Occupational Health - Occupational Stress Questionnaire    Feeling of Stress : Not at all  Social Connections: Socially Integrated (08/16/2022)   Social Connection and Isolation Panel [NHANES]    Frequency of Communication with Friends and Family: More than three times a week    Frequency of Social Gatherings with Friends and Family: Three times a week    Attends Religious Services: More than 4 times per year    Active Member of Clubs or Organizations: Yes    Attends Banker Meetings: More than 4 times per year    Marital Status: Married  Catering manager Violence: Not At Risk (08/16/2022)   Humiliation, Afraid, Rape, and Kick questionnaire    Fear of Current or Ex-Partner: No    Emotionally Abused: No    Physically Abused: No    Sexually Abused: No     BP (!) 100/56   Pulse 62   Ht 6\' 1"  (1.854 m)   Wt 198 lb (89.8 kg)  SpO2 98%   BMI 26.12 kg/m   Physical Exam:  Well appearing NAD HEENT: Unremarkable Neck:  No JVD, no thyromegally Lymphatics:  No  adenopathy Back:  No CVA tenderness Lungs:  Clear HEART:  Regular rate rhythm, no murmurs, no rubs, no clicks Abd:  soft, positive bowel sounds, no organomegally, no rebound, no guarding Ext:  2 plus pulses, no edema, no cyanosis, no clubbing Skin:  No rashes no nodules Neuro:  CN II through XII intact, motor grossly intact  EKG - nsr with biv pacing  DEVICE  Normal device function.  See PaceArt for details.   Assess/Plan: VT - he is doing well with no sustained VT. I have asked him to reduce his dose of amio to 200 mg daily, none on Sunday. Chronic systolic heart failure - his symptoms are class 2A. Continue GDMT. PAF - he is maintaining NSR. CHB - he is asymptomatic s/p biv ICD insertion.  Sharlot Gowda Carmelle Bamberg,MD

## 2022-11-25 NOTE — Patient Instructions (Addendum)
Medication Instructions:  Your physician has recommended you make the following change in your medication:  Decrease amiodarone to 1 tablet daily. Do not take on Sunday.  Lab Work: None ordered.  If you have labs (blood work) drawn today and your tests are completely normal, you will receive your results only by: MyChart Message (if you have MyChart) OR A paper copy in the mail If you have any lab test that is abnormal or we need to change your treatment, we will call you to review the results.  Testing/Procedures: None ordered.  Follow-Up: At [facility], you and your health needs are our priority.  As part of our continuing mission to provide you with exceptional heart care, we have created designated Provider Care Teams.  These Care Teams include your primary Cardiologist (physician) and Advanced Practice Providers (APPs -  Physician Assistants and Nurse Practitioners) who all work together to provide you with the care you need, when you need it.  We recommend signing up for the patient portal called "MyChart".  Sign up information is provided on this After Visit Summary.  MyChart is used to connect with patients for Virtual Visits (Telemedicine).  Patients are able to view lab/test results, encounter notes, upcoming appointments, etc.  Non-urgent messages can be sent to your provider as well.   To learn more about what you can do with MyChart, go to [email or web address].    Your next appointment:   1 year(s)  The format for your next appointment:   In Person  Provider:   Lewayne Bunting, MD{or one of the following Advanced Practice Providers on your designated Care Team:   Francis Dowse, [place] Casimiro Needle "Mardelle Matte" [place], [place] Earnest Rosier, NP  Remote monitoring is used to monitor your Pacemaker/ ICD from home. This monitoring reduces the number of office visits required to check your device to one time per year. It allows [place] to keep an eye on the functioning of your device to ensure  it is working properly. You are scheduled for a device check from home on 12/02/22. You may send your transmission at any time that day. If you have a wireless device, the transmission will be sent automatically. After your physician reviews your transmission, you will receive a postcard with your next transmission date.  Important Information About Sugar

## 2022-12-02 ENCOUNTER — Ambulatory Visit: Payer: Medicare PPO | Attending: Internal Medicine

## 2022-12-02 DIAGNOSIS — I5022 Chronic systolic (congestive) heart failure: Secondary | ICD-10-CM

## 2022-12-02 DIAGNOSIS — Z95810 Presence of automatic (implantable) cardiac defibrillator: Secondary | ICD-10-CM

## 2022-12-04 NOTE — Progress Notes (Signed)
Remote ICD transmission.   

## 2022-12-11 NOTE — Progress Notes (Signed)
EPIC Encounter for ICM Monitoring  Patient Name: Philip Chavez is a 72 y.o. male Date: 12/11/2022 Primary Care Physican: Shelby Mattocks, DO Primary Cardiologist: Shirlee Latch Electrophysiologist: Ladona Ridgel BiV Pacing:  99.8%        07/26/2021 Weight: 200 lbs 09/05/2021 Weight: 200 lbs 04/01/2021 Office Weight: 195 lbs 05/08/2022 Weight: 195 lbs 06/12/2022 Weight: 195 lbs 10/31/2022 Weight: 196 lbs   Clinical Status Since 25-Nov-2022 Time in AT/AF  0.0 hours/day (0.0 %)            Transmission results reviewed.     OptiVol Thoracic impedance suggesting intermittent days with possible fluid accumulation within the last month.    Prescribed:  Torsemide 20 mg 1 tablet by mouth once a day.   Takes PRN due to causing urinary incontinence.  Potassium 10 mEq take 1 tablet by mouth twice a day Spironolactone 25 mg take 1 tablet by mouth once a day   Labs: 10/09/2022 Creatinine 1.45, BUN 16, Potassium 4.3, Sodium 136, GFR 52 09/23/2022 Creatinine 1.80, BUN 17, Potassium 4.2, Sodium 137, GFR 40 05/23/2022 Creatinine 1.44, BUN 12, Potassium 3.9, Sodium 136, GFR 52  05/22/2022 Creatinine 1.56, BUN 13, Potassium 3.9, Sodium 135, GFR 47  04/12/2022 Creatinine 1.38, BUN 17, Potassium 4.5, Sodium 140, GFR 55 04/01/2022 Creatinine 1.36, BUN 17, Potassium 4.2, Sodium 136, GFR 56  02/12/2022 Creatinine 1.39, BUN 14, Potassium 4.1, Sodium 135, GFR 54  02/11/2022 Creatinine 1.19, BUN 10, Potassium 4.2, Sodium 137, GFR >60  A complete set of results can be found in Results Review.   Recommendations:  No changes.   Follow-up plan: ICM clinic phone appointment on 01/13/2023.   91 day device clinic remote transmission 02/21/2023.     EP/Cardiology Office Visits:  Next yearly visit due 11/2023 with Dr Ladona Ridgel (no recall for 2025).   12/25/2022 with Dr Shirlee Latch.      Copy of ICM check sent to Dr. Ladona Ridgel.   3 month ICM trend: 12/02/2022.    12-14 Month ICM trend:     Karie Soda, RN 12/11/2022 2:42 PM

## 2022-12-12 NOTE — Progress Notes (Signed)
  SUBJECTIVE:   CHIEF COMPLAINT / HPI:   Patient presents today with concerns for muscle spasms.  On Sunday he was bending over to clean his feet and felt a tightness in his right lower back.  Since then, he has had tenderness with movement in that area.  He has been using Voltaren gel which is somewhat helpful.  Notes he has had muscle spasms before and this feels similar.  Additionally, he has used muscle relaxers in the past but cannot state the name of them.  Denies any shooting pains or loss of sensation down the legs.  Denies bowel or bladder incontinence.  PERTINENT  PMH / PSH: Persistent A-fib, history of PE/DVT, HFrEF with EF less than 20% with ICD in place, prediabetes, BPH, CKD 3, dyslipidemia  OBJECTIVE:  BP 127/70   Pulse 71   Ht 6\' 1"  (1.854 m)   Wt 196 lb (88.9 kg)   SpO2 99%   BMI 25.86 kg/m  General: Well-appearing, NAD MSK, Back, lumbar:  - Inspection: no gross deformity or asymmetry, swelling or ecchymosis. No skin changes - Palpation: No TTP over the spinous processes, or SI joints b/l. TTP and hypertonicity over right paraspinal muscles approximately L2-L4 - ROM: full active ROM of the lumbar spine in flexion and extension with discomfort.  ASSESSMENT/PLAN:   Assessment & Plan Muscle spasm of back History and physical exam consistent with muscle spasm of right lumbar paraspinal muscles.  He has used Flexeril in the past according to chart review.  Given age, opted for Zanaflex 2 mg twice daily as needed and kidney function is appropriate.  Reassurance provided, should improve in 1 to 2 weeks.  Continue physical activity, avoid bed rest. No follow-ups on file. Shelby Mattocks, DO 12/13/2022, 11:55 AM PGY-3,  Family Medicine

## 2022-12-13 ENCOUNTER — Other Ambulatory Visit (HOSPITAL_COMMUNITY): Payer: Self-pay

## 2022-12-13 ENCOUNTER — Ambulatory Visit: Payer: Medicare PPO | Admitting: Student

## 2022-12-13 VITALS — BP 127/70 | HR 71 | Ht 73.0 in | Wt 196.0 lb

## 2022-12-13 DIAGNOSIS — M62830 Muscle spasm of back: Secondary | ICD-10-CM | POA: Insufficient documentation

## 2022-12-13 MED ORDER — TIZANIDINE HCL 2 MG PO TABS
2.0000 mg | ORAL_TABLET | Freq: Two times a day (BID) | ORAL | 0 refills | Status: DC | PRN
Start: 2022-12-13 — End: 2022-12-23
  Filled 2022-12-13 (×2): qty 20, 10d supply, fill #0

## 2022-12-13 NOTE — Patient Instructions (Signed)
It was great to see you today! Thank you for choosing [facility] for your primary care.  Today we addressed: I agree this is a muscle spasm.  Thank you for coming into address this.  I have prescribed you Zanaflex which is the safest muscle relaxer in the elderly population.  Please use this twice a day as needed.  You may also use IcyHot rub and Tylenol.  This should improve in the next 1 to 2 weeks.  Stay active, bed rest does not help muscle spasms get better any sooner.  If you haven't already, sign up for My Chart to have easy access to your labs results, and communication with your primary care physician.  Return if symptoms worsen or fail to improve. Please arrive 15 minutes before your appointment to ensure smooth check in process.  We appreciate your efforts in making this happen.  Thank you for allowing me to participate in your care, Shelby Mattocks, DO 12/13/2022, 12:00 PM PGY-3, [facility]

## 2022-12-13 NOTE — Assessment & Plan Note (Signed)
History and physical exam consistent with muscle spasm of right lumbar paraspinal muscles.  He has used Flexeril in the past according to chart review.  Given age, opted for Zanaflex 2 mg twice daily as needed and kidney function is appropriate.  Reassurance provided, should improve in 1 to 2 weeks.  Continue physical activity, avoid bed rest.

## 2022-12-14 ENCOUNTER — Encounter: Payer: Self-pay | Admitting: Student

## 2022-12-20 ENCOUNTER — Other Ambulatory Visit (HOSPITAL_COMMUNITY): Payer: Self-pay

## 2022-12-23 ENCOUNTER — Other Ambulatory Visit (HOSPITAL_COMMUNITY): Payer: Self-pay

## 2022-12-23 ENCOUNTER — Other Ambulatory Visit: Payer: Self-pay | Admitting: Student

## 2022-12-23 DIAGNOSIS — M62830 Muscle spasm of back: Secondary | ICD-10-CM

## 2022-12-24 ENCOUNTER — Other Ambulatory Visit (HOSPITAL_COMMUNITY): Payer: Self-pay

## 2022-12-24 MED ORDER — TIZANIDINE HCL 2 MG PO TABS
2.0000 mg | ORAL_TABLET | Freq: Two times a day (BID) | ORAL | 0 refills | Status: DC | PRN
  Filled 2022-12-24: qty 20, 10d supply, fill #0

## 2022-12-25 ENCOUNTER — Encounter (HOSPITAL_COMMUNITY): Payer: Self-pay | Admitting: Cardiology

## 2022-12-25 ENCOUNTER — Ambulatory Visit (HOSPITAL_COMMUNITY)
Admission: RE | Admit: 2022-12-25 | Discharge: 2022-12-25 | Disposition: A | Discharge status: Home / Self Care | Payer: Medicare PPO | Source: Ambulatory Visit | Attending: Cardiology | Admitting: Cardiology

## 2022-12-25 ENCOUNTER — Other Ambulatory Visit (HOSPITAL_COMMUNITY): Payer: Self-pay

## 2022-12-25 VITALS — BP 104/60 | HR 62 | Wt 197.2 lb

## 2022-12-25 DIAGNOSIS — I5022 Chronic systolic (congestive) heart failure: Secondary | ICD-10-CM

## 2022-12-25 DIAGNOSIS — I5042 Chronic combined systolic (congestive) and diastolic (congestive) heart failure: Secondary | ICD-10-CM | POA: Diagnosis not present

## 2022-12-25 LAB — COMPREHENSIVE METABOLIC PANEL WITH GFR
ALT: 21 U/L (ref 0–44)
AST: 22 U/L (ref 15–41)
Albumin: 3.9 g/dL (ref 3.5–5.0)
Alkaline Phosphatase: 72 U/L (ref 38–126)
Anion gap: 7 (ref 5–15)
BUN: 18 mg/dL (ref 8–23)
CO2: 25 mmol/L (ref 22–32)
Calcium: 9.4 mg/dL (ref 8.9–10.3)
Chloride: 107 mmol/L (ref 98–111)
Creatinine, Ser: 1.56 mg/dL — ABNORMAL HIGH (ref 0.61–1.24)
GFR, Estimated: 47 mL/min — ABNORMAL LOW
Glucose, Bld: 83 mg/dL (ref 70–99)
Potassium: 4.7 mmol/L (ref 3.5–5.1)
Sodium: 139 mmol/L (ref 135–145)
Total Bilirubin: 0.7 mg/dL
Total Protein: 7.1 g/dL (ref 6.5–8.1)

## 2022-12-25 LAB — LIPID PANEL
Cholesterol: 146 mg/dL (ref 0–200)
HDL: 45 mg/dL
LDL Cholesterol: 86 mg/dL (ref 0–99)
Total CHOL/HDL Ratio: 3.2 ratio
Triglycerides: 74 mg/dL
VLDL: 15 mg/dL (ref 0–40)

## 2022-12-25 LAB — TSH: TSH: 0.878 u[IU]/mL (ref 0.350–4.500)

## 2022-12-25 LAB — DIGOXIN LEVEL: Digoxin Level: 0.4 ng/mL — ABNORMAL LOW (ref 0.8–2.0)

## 2022-12-25 LAB — BRAIN NATRIURETIC PEPTIDE: B Natriuretic Peptide: 908.4 pg/mL — ABNORMAL HIGH (ref 0.0–100.0)

## 2022-12-25 MED ORDER — ENTRESTO 49-51 MG PO TABS
1.0000 | ORAL_TABLET | Freq: Two times a day (BID) | ORAL | 11 refills | Status: DC
Start: 2022-12-25 — End: 2023-12-08
  Filled 2022-12-25: qty 60, 30d supply, fill #0
  Filled 2023-01-24: qty 60, 30d supply, fill #1
  Filled 2023-02-20: qty 60, 30d supply, fill #2
  Filled 2023-03-26: qty 60, 30d supply, fill #3
  Filled 2023-04-26: qty 60, 30d supply, fill #4
  Filled 2023-05-28: qty 60, 30d supply, fill #5
  Filled 2023-06-29: qty 60, 30d supply, fill #6
  Filled 2023-07-29: qty 60, 30d supply, fill #7
  Filled 2023-08-29: qty 60, 30d supply, fill #8
  Filled 2023-09-30 (×2): qty 60, 30d supply, fill #9
  Filled 2023-10-29: qty 60, 30d supply, fill #10
  Filled 2023-11-28: qty 60, 30d supply, fill #11

## 2022-12-25 NOTE — Patient Instructions (Signed)
INCREASE Entresto to 49/51 mg Twice daily  Labs done today, your results will be available in MyChart, we will contact you for abnormal readings.  Repeat blood work in 10 days.  Your physician recommends that you schedule a follow-up appointment in: 3 months.  If you have any questions or concerns before your next appointment please send [place] a message through [place] or call our office at [phone].    TO LEAVE A MESSAGE FOR THE NURSE SELECT OPTION 2, PLEASE LEAVE A MESSAGE INCLUDING: YOUR NAME DATE OF BIRTH CALL BACK NUMBER REASON FOR CALL**this is important as we prioritize the call backs  YOU WILL RECEIVE A CALL BACK THE SAME DAY AS LONG AS YOU CALL BEFORE 4:00 PM  At the [facility], you and your health needs are our priority. As part of our continuing mission to provide you with exceptional heart care, we have created designated Provider Care Teams. These Care Teams include your primary Cardiologist (physician) and Advanced Practice Providers (APPs- Physician Assistants and Nurse Practitioners) who all work together to provide you with the care you need, when you need it.   You may see any of the following providers on your designated Care Team at your next follow up: Dr Arvilla Meres Dr Marca Ancona Dr. Dorthula Nettles Dr. Clearnce Hasten Amy Filbert Schilder, NP Robbie Lis, [place] [facility] [place], [place] Brynda Peon, NP [place] Lee, NP Karle Plumber, PharmD   Please be sure to bring in all your medications bottles to every appointment.    Thank you for choosing [facility]
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
## 2022-12-30 ENCOUNTER — Other Ambulatory Visit (HOSPITAL_COMMUNITY): Payer: Self-pay

## 2023-01-03 ENCOUNTER — Other Ambulatory Visit (HOSPITAL_COMMUNITY): Payer: Self-pay

## 2023-01-03 ENCOUNTER — Other Ambulatory Visit: Payer: Self-pay | Admitting: Student

## 2023-01-03 DIAGNOSIS — M62830 Muscle spasm of back: Secondary | ICD-10-CM

## 2023-01-04 ENCOUNTER — Other Ambulatory Visit (HOSPITAL_COMMUNITY): Payer: Self-pay

## 2023-01-06 ENCOUNTER — Other Ambulatory Visit (HOSPITAL_COMMUNITY): Payer: Self-pay

## 2023-01-06 ENCOUNTER — Other Ambulatory Visit: Payer: Self-pay | Admitting: Student

## 2023-01-06 DIAGNOSIS — M62830 Muscle spasm of back: Secondary | ICD-10-CM

## 2023-01-07 ENCOUNTER — Other Ambulatory Visit (HOSPITAL_COMMUNITY): Payer: Self-pay

## 2023-01-08 ENCOUNTER — Other Ambulatory Visit: Payer: Self-pay

## 2023-01-08 ENCOUNTER — Ambulatory Visit (HOSPITAL_COMMUNITY)
Admission: RE | Admit: 2023-01-08 | Discharge: 2023-01-08 | Disposition: A | Discharge status: Home / Self Care | Payer: Medicare PPO | Source: Ambulatory Visit | Attending: Cardiology | Admitting: Cardiology

## 2023-01-08 DIAGNOSIS — I5042 Chronic combined systolic (congestive) and diastolic (congestive) heart failure: Secondary | ICD-10-CM | POA: Insufficient documentation

## 2023-01-08 LAB — BASIC METABOLIC PANEL
Anion gap: 8 (ref 5–15)
BUN: 18 mg/dL (ref 8–23)
CO2: 25 mmol/L (ref 22–32)
Calcium: 9.2 mg/dL (ref 8.9–10.3)
Chloride: 105 mmol/L (ref 98–111)
Creatinine, Ser: 1.56 mg/dL — ABNORMAL HIGH (ref 0.61–1.24)
GFR, Estimated: 47 mL/min — ABNORMAL LOW (ref >60)
Glucose, Bld: 93 mg/dL (ref 70–99)
Potassium: 4 mmol/L (ref 3.5–5.1)
Sodium: 138 mmol/L (ref 135–145)

## 2023-01-09 ENCOUNTER — Other Ambulatory Visit (HOSPITAL_COMMUNITY): Payer: Self-pay

## 2023-01-10 ENCOUNTER — Other Ambulatory Visit (HOSPITAL_COMMUNITY): Payer: Self-pay

## 2023-01-10 ENCOUNTER — Other Ambulatory Visit: Payer: Self-pay | Admitting: Student

## 2023-01-10 DIAGNOSIS — M62830 Muscle spasm of back: Secondary | ICD-10-CM

## 2023-01-13 ENCOUNTER — Ambulatory Visit: Payer: Medicare PPO | Attending: Internal Medicine

## 2023-01-13 DIAGNOSIS — I5022 Chronic systolic (congestive) heart failure: Secondary | ICD-10-CM

## 2023-01-13 DIAGNOSIS — Z95810 Presence of automatic (implantable) cardiac defibrillator: Secondary | ICD-10-CM

## 2023-01-15 ENCOUNTER — Telehealth: Payer: Self-pay

## 2023-01-15 NOTE — Telephone Encounter (Signed)
 Remote ICM transmission received.  Attempted call to patient regarding ICM remote transmission and left detailed message per DPR.  Left ICM phone number and advised to return call for any fluid symptoms or questions. Next ICM remote transmission scheduled 02/17/2023.

## 2023-01-15 NOTE — Progress Notes (Signed)
EPIC Encounter for ICM Monitoring  Patient Name: Philip Chavez is a 72 y.o. male Date: 01/15/2023 Primary Care Physican: Shelby Mattocks, DO Primary Cardiologist: Shirlee Latch Electrophysiologist: Ladona Ridgel BiV Pacing:  98.3%        07/26/2021 Weight: 200 lbs 09/05/2021 Weight: 200 lbs 04/01/2021 Office Weight: 195 lbs 05/08/2022 Weight: 195 lbs 06/12/2022 Weight: 195 lbs 10/31/2022 Weight: 196 lbs   Clinical Status Since 27-Dec-2022 Time in AT/AF  0.0 hours/day (0.0 %)            Attempted call to patient and unable to reach.  Left detailed message per DPR regarding transmission.  Transmission results reviewed.    OptiVol Thoracic impedance suggesting normal fluid levels within the last month.    Prescribed:  Torsemide 20 mg 1 tablet by mouth once a day.   Takes PRN due to causing urinary incontinence.  Potassium 10 mEq take 1 tablet by mouth twice a day Spironolactone 25 mg take 1 tablet by mouth once a day   Labs: 10/09/2022 Creatinine 1.45, BUN 16, Potassium 4.3, Sodium 136, GFR 52 09/23/2022 Creatinine 1.80, BUN 17, Potassium 4.2, Sodium 137, GFR 40 05/23/2022 Creatinine 1.44, BUN 12, Potassium 3.9, Sodium 136, GFR 52  05/22/2022 Creatinine 1.56, BUN 13, Potassium 3.9, Sodium 135, GFR 47  04/12/2022 Creatinine 1.38, BUN 17, Potassium 4.5, Sodium 140, GFR 55 04/01/2022 Creatinine 1.36, BUN 17, Potassium 4.2, Sodium 136, GFR 56  02/12/2022 Creatinine 1.39, BUN 14, Potassium 4.1, Sodium 135, GFR 54  02/11/2022 Creatinine 1.19, BUN 10, Potassium 4.2, Sodium 137, GFR >60  A complete set of results can be found in Results Review.   Recommendations:  Left voice mail with ICM number and encouraged to call if experiencing any fluid symptoms.   Follow-up plan: ICM clinic phone appointment on 02/17/2023.   91 day device clinic remote transmission 02/21/2023.     EP/Cardiology Office Visits:  Next yearly visit due 11/2023 with Dr Ladona Ridgel (no recall for 2025).   03/26/2023 with HF Clinic.      Copy of  ICM check sent to Dr. Ladona Ridgel.   3 month ICM trend: 01/13/2023.    12-14 Month ICM trend:     Karie Soda, RN 01/15/2023 8:43 AM

## 2023-01-17 DIAGNOSIS — Z125 Encounter for screening for malignant neoplasm of prostate: Secondary | ICD-10-CM | POA: Diagnosis not present

## 2023-01-17 DIAGNOSIS — N400 Enlarged prostate without lower urinary tract symptoms: Secondary | ICD-10-CM | POA: Diagnosis not present

## 2023-02-02 ENCOUNTER — Other Ambulatory Visit: Payer: Self-pay | Admitting: Cardiology

## 2023-02-03 ENCOUNTER — Other Ambulatory Visit (HOSPITAL_COMMUNITY): Payer: Self-pay

## 2023-02-03 MED ORDER — DIGOXIN 125 MCG PO TABS
0.0625 mg | ORAL_TABLET | Freq: Every day | ORAL | 6 refills | Status: DC
  Filled 2023-02-03: qty 15, 30d supply, fill #0
  Filled 2023-03-05 – 2023-03-08 (×2): qty 15, 30d supply, fill #1
  Filled 2023-04-03: qty 15, 30d supply, fill #2
  Filled 2023-04-03: qty 15, 30d supply, fill #0
  Filled 2023-05-03: qty 15, 30d supply, fill #1
  Filled 2023-06-05: qty 15, 30d supply, fill #2

## 2023-02-07 ENCOUNTER — Other Ambulatory Visit (HOSPITAL_COMMUNITY): Payer: Self-pay

## 2023-02-09 ENCOUNTER — Other Ambulatory Visit (HOSPITAL_COMMUNITY): Payer: Self-pay

## 2023-02-17 ENCOUNTER — Ambulatory Visit: Payer: Medicare PPO | Attending: Internal Medicine

## 2023-02-17 ENCOUNTER — Telehealth (HOSPITAL_COMMUNITY): Payer: Self-pay

## 2023-02-17 DIAGNOSIS — Z95810 Presence of automatic (implantable) cardiac defibrillator: Secondary | ICD-10-CM

## 2023-02-17 DIAGNOSIS — I5022 Chronic systolic (congestive) heart failure: Secondary | ICD-10-CM | POA: Diagnosis not present

## 2023-02-17 NOTE — Telephone Encounter (Signed)
 Patient called and informed him that his disability placard form was signed and will be at the front desk for him to collect.

## 2023-02-19 ENCOUNTER — Other Ambulatory Visit (HOSPITAL_COMMUNITY): Payer: Self-pay

## 2023-02-19 NOTE — Progress Notes (Signed)
 EPIC Encounter for ICM Monitoring  Patient Name: Philip Chavez is a 73 y.o. male Date: 02/19/2023 Primary Care Physican: Orlando Pond, DO Primary Cardiologist: Rolan Electrophysiologist: Waddell BiV Pacing:  97.2%        07/26/2021 Weight: 200 lbs 09/05/2021 Weight: 200 lbs 04/01/2021 Office Weight: 195 lbs 05/08/2022 Weight: 195 lbs 06/12/2022 Weight: 195 lbs 10/31/2022 Weight: 196 lbs 02/19/2023 Weight: 195-198   Clinical Status Since 13-Jan-2023 Time in AT/AF  0.0 hours/day (0.0 %)           Spoke with patient and heart failure questions reviewed.  Transmission results reviewed.  Pt asymptomatic for fluid accumulation.  Reports feeling well at this time and voices no complaints.     OptiVol Thoracic impedance suggesting normal fluid levels within the last month.    Prescribed:  Torsemide  20 mg 1 tablet by mouth once a day.   Takes PRN due to causing urinary incontinence.  Potassium 10 mEq take 1 tablet by mouth twice a day Spironolactone  25 mg take 1 tablet by mouth once a day   Labs: 01/08/2023 Creatinine 1.56, BUN 18, Potassium 4.0, Sodium 138, GFR 47 10/09/2022 Creatinine 1.45, BUN 16, Potassium 4.3, Sodium 136, GFR 52 09/23/2022 Creatinine 1.80, BUN 17, Potassium 4.2, Sodium 137, GFR 40 05/23/2022 Creatinine 1.44, BUN 12, Potassium 3.9, Sodium 136, GFR 52  05/22/2022 Creatinine 1.56, BUN 13, Potassium 3.9, Sodium 135, GFR 47  04/12/2022 Creatinine 1.38, BUN 17, Potassium 4.5, Sodium 140, GFR 55 04/01/2022 Creatinine 1.36, BUN 17, Potassium 4.2, Sodium 136, GFR 56  02/12/2022 Creatinine 1.39, BUN 14, Potassium 4.1, Sodium 135, GFR 54  02/11/2022 Creatinine 1.19, BUN 10, Potassium 4.2, Sodium 137, GFR >60  A complete set of results can be found in Results Review.   Recommendations:  No changes and encouraged to call if experiencing any fluid symptoms.   Follow-up plan: ICM clinic phone appointment on 03/24/2023.   91 day device clinic remote transmission 05/23/2023.      EP/Cardiology Office Visits:  Next yearly visit due 11/2023 with Dr Waddell (no recall for 2025).   03/26/2023 with HF Clinic.      Copy of ICM check sent to Dr. Waddell.   3 month ICM trend: 02/17/2023.    12-14 Month ICM trend:     Mitzie GORMAN Garner, RN 02/19/2023 8:46 AM

## 2023-02-21 ENCOUNTER — Ambulatory Visit: Payer: Medicare PPO

## 2023-02-21 DIAGNOSIS — I428 Other cardiomyopathies: Secondary | ICD-10-CM

## 2023-02-21 LAB — CUP PACEART REMOTE DEVICE CHECK
Battery Remaining Longevity: 116 mo
Battery Voltage: 3 V
Brady Statistic RV Percent Paced: 0 %
Date Time Interrogation Session: 20250117024446
HighPow Impedance: 54 Ohm
Lead Channel Impedance Value: 266 Ohm
Lead Channel Impedance Value: 342 Ohm
Lead Channel Impedance Value: 361 Ohm
Lead Channel Impedance Value: 380 Ohm
Lead Channel Impedance Value: 380 Ohm
Lead Channel Impedance Value: 703 Ohm
Lead Channel Pacing Threshold Amplitude: 0.5 V
Lead Channel Pacing Threshold Pulse Width: 0.4 ms
Lead Channel Sensing Intrinsic Amplitude: 16.1 mV
Lead Channel Sensing Intrinsic Amplitude: 3.3 mV
Lead Channel Setting Pacing Amplitude: 2 V
Lead Channel Setting Pacing Amplitude: 2 V
Lead Channel Setting Pacing Pulse Width: 0.4 ms
Lead Channel Setting Sensing Sensitivity: 0.3 mV
Zone Setting Status: 755011
Zone Setting Status: 755011
Zone Setting Status: 755011

## 2023-03-01 ENCOUNTER — Other Ambulatory Visit (HOSPITAL_COMMUNITY): Payer: Self-pay

## 2023-03-07 ENCOUNTER — Other Ambulatory Visit: Payer: Self-pay | Admitting: Gastroenterology

## 2023-03-08 ENCOUNTER — Other Ambulatory Visit: Payer: Self-pay

## 2023-03-08 ENCOUNTER — Other Ambulatory Visit (HOSPITAL_COMMUNITY): Payer: Self-pay

## 2023-03-10 ENCOUNTER — Other Ambulatory Visit (HOSPITAL_COMMUNITY): Payer: Self-pay

## 2023-03-12 ENCOUNTER — Telehealth (HOSPITAL_BASED_OUTPATIENT_CLINIC_OR_DEPARTMENT_OTHER): Payer: Self-pay | Admitting: *Deleted

## 2023-03-12 ENCOUNTER — Encounter: Payer: Self-pay | Admitting: Internal Medicine

## 2023-03-12 NOTE — Progress Notes (Signed)
 PERIOPERATIVE PRESCRIPTION FOR IMPLANTED CARDIAC DEVICE PROGRAMMING  Patient Information: Name:  Philip Chavez  DOB:  02-Sep-1950  MRN:  [id number]     Procedure:   Colonoscopy   Date of Surgery:  Clearance 04/25/22                                  Surgeon:  Dr. Jerrell Sol Surgeon's Group or Practice Name:  [facility] Phone number:  [phone] Fax number:  [phone]   Type of Clearance Requested:   - Medical  - Pharmacy:  Hold Apixaban  (Eliquis ) Not Indicated   Type of Anesthesia:   Propofol   Device Information:  Clinic EP Physician:  Danelle Birmingham, MD   Device Type:  Defibrillator Manufacturer and Phone #:  Medtronic: [id number] Pacemaker Dependent?:  Yes.   Date of Last Device Check:  02/21/2023 Normal Device Function?:  Yes.    Electrophysiologist's Recommendations:  Have magnet available. Provide continuous ECG monitoring when magnet is used or reprogramming is to be performed.  Procedure may interfere with device function.  Magnet should be placed over device during procedure.  Per Device Clinic Standing Orders, Prentice JINNY Silvan, RN  2:36 PM 03/12/2023

## 2023-03-12 NOTE — Telephone Encounter (Signed)
 Pharmacy please advise on holding Eliquis  prior to colonoscopy scheduled for 04/25/2022. Thank you.

## 2023-03-12 NOTE — Telephone Encounter (Signed)
   Pre-operative Risk Assessment    Patient Name: Philip Chavez  DOB: 10/09/1950 MRN: [id number]   Date of last office visit: 11/25/2022 Date of next office visit: None   Request for Surgical Clearance    Procedure:   Colonoscopy  Date of Surgery:  Clearance 04/25/22                                 Surgeon:  Dr. Jerrell Sol Surgeon's Group or Practice Name:  [facility] Phone number:  [phone] Fax number:  [phone]   Type of Clearance Requested:   - Medical  - Pharmacy:  Hold Apixaban  (Eliquis ) Not Indicated   Type of Anesthesia:   Propofol    Additional requests/questions:    Signed, Edsel Grayce Sanders   03/12/2023, 11:51 AM

## 2023-03-18 ENCOUNTER — Encounter: Payer: Self-pay | Admitting: Student

## 2023-03-18 ENCOUNTER — Other Ambulatory Visit (HOSPITAL_COMMUNITY): Payer: Self-pay

## 2023-03-18 ENCOUNTER — Telehealth: Payer: Self-pay

## 2023-03-18 ENCOUNTER — Ambulatory Visit: Payer: Medicare PPO | Admitting: Student

## 2023-03-18 VITALS — BP 85/58 | HR 74 | Ht 73.0 in | Wt 193.6 lb

## 2023-03-18 DIAGNOSIS — N3000 Acute cystitis without hematuria: Secondary | ICD-10-CM

## 2023-03-18 DIAGNOSIS — R350 Frequency of micturition: Secondary | ICD-10-CM

## 2023-03-18 LAB — POCT URINALYSIS DIP (CLINITEK)
Bilirubin, UA: NEGATIVE
Glucose, UA: >=1000 mg/dL — AB
Ketones, POC UA: NEGATIVE mg/dL
Nitrite, UA: POSITIVE — AB
POC PROTEIN,UA: 30 — AB
Spec Grav, UA: 1.015 (ref 1.010–1.025)
Urobilinogen, UA: 1 U/dL
pH, UA: 5.5 (ref 5.0–8.0)

## 2023-03-18 LAB — POCT UA - MICROSCOPIC ONLY: WBC, Ur, HPF, POC: >20 (ref 0–5)

## 2023-03-18 MED ORDER — CEFADROXIL 500 MG PO CAPS
500.0000 mg | ORAL_CAPSULE | Freq: Two times a day (BID) | ORAL | 0 refills | Status: AC
  Filled 2023-03-18: qty 28, 14d supply, fill #0

## 2023-03-18 NOTE — Patient Instructions (Addendum)
It was great seeing you today.  As we discussed, -You have a urinary tract infection.  We are treating this with an antibiotic.  Take this twice a day for total 14 days. -Please go to urgent care or call us if you develop fever, blood in your urine, chills, worsening of symptoms   If you have any questions or concerns, please feel free to call the clinic.   Have a wonderful day,  Dr. Darral Dash [facility] [phone]

## 2023-03-18 NOTE — Progress Notes (Signed)
    SUBJECTIVE:   CHIEF COMPLAINT / HPI:   Philip Chavez is a 73 year old male with a history of HFrEF (LVEF less than 20%), BPH here to discuss increased urination. He denies any fevers, penile discharge,  He has been following with urology for 3-4 years. He is urinating every 10 minutes, started 7 days ago. No fevers or  chills. No hematuria. No dysuria. No new sexual partners.  If he does not make it to the bathroom in time, he says he urinates on himself. No dribbling, the stream moves fast. He feels like his bladder is empty when he finishes urinating.  PERTINENT  PMH / PSH:    OBJECTIVE:   BP (!) 85/58   Pulse 74   Ht 6\' 1"  (1.854 m)   Wt 193 lb 9.6 oz (87.8 kg)   SpO2 99%   BMI 25.54 kg/m  General: Well-appearing, well-nourished Respiratory: Normal work of breathing on room air with no wheezing or crackles Abdomen: Soft, nontender and nondistended.  No suprapubic tenderness Extremities: Warm and well-perfused   ASSESSMENT/PLAN:   Acute cystitis Complicated by male age. Reviewed prior urine cultures which grew E. coli, sensitive to cefazolin. Will treat with cefadroxil 500 mg twice daily for 14 days. Discussed return precautions should he develop hematuria, fever, chills, worsening of symptoms. Follow-up urine culture   Darral Dash, DO [facility]

## 2023-03-18 NOTE — Telephone Encounter (Signed)
Returned call to patient as requested by voice mail message.  He stated he was calling because he was urinating a lot that started last week.  He takes PRN lasix and has not taken any in the last 2 weeks.  Advised to call PCP regarding increased urination.

## 2023-03-18 NOTE — Assessment & Plan Note (Signed)
Complicated by male age. Reviewed prior urine cultures which grew E. coli, sensitive to cefazolin. Will treat with cefadroxil 500 mg twice daily for 14 days. Discussed return precautions should he develop hematuria, fever, chills, worsening of symptoms. Follow-up urine culture

## 2023-03-19 ENCOUNTER — Telehealth: Payer: Self-pay | Admitting: *Deleted

## 2023-03-19 NOTE — Telephone Encounter (Signed)
  Patient Consent for Virtual Visit   {[name] has provided verbal consent on 03/19/2023 for a virtual visit (video or telephone).   CONSENT FOR VIRTUAL VISIT FOR:  Philip Chavez  By participating in this virtual visit I agree to the following:  I hereby voluntarily request, consent and authorize [facility] and its employed or contracted physicians, physician assistants, nurse practitioners or other licensed health care professionals (the Practitioner), to provide me with telemedicine health care services (the "Services") as deemed necessary by the treating Practitioner. I acknowledge and consent to receive the Services by the Practitioner via telemedicine. I understand that the telemedicine visit will involve communicating with the Practitioner through live audiovisual communication technology and the disclosure of certain medical information by electronic transmission. I acknowledge that I have been given the opportunity to request an in-person assessment or other available alternative prior to the telemedicine visit and am voluntarily participating in the telemedicine visit.  I understand that I have the right to withhold or withdraw my consent to the use of telemedicine in the course of my care at any time, without affecting my right to future care or treatment, and that the Practitioner or I may terminate the telemedicine visit at any time. I understand that I have the right to inspect all information obtained and/or recorded in the course of the telemedicine visit and may receive copies of available information for a reasonable fee.  I understand that some of the potential risks of receiving the Services via telemedicine include:  Delay or interruption in medical evaluation due to technological equipment failure or disruption; Information transmitted may not be sufficient (e.g. poor resolution of images) to allow for appropriate medical decision making by the Practitioner; and/or   In rare instances, security protocols could fail, causing a breach of personal health information.  Furthermore, I acknowledge that it is my responsibility to provide information about my medical history, conditions and care that is complete and accurate to the best of my ability. I acknowledge that Practitioner's advice, recommendations, and/or decision may be based on factors not within their control, such as incomplete or inaccurate data provided by me or distortions of diagnostic images or specimens that may result from electronic transmissions. I understand that the practice of medicine is not an exact science and that Practitioner makes no warranties or guarantees regarding treatment outcomes. I acknowledge that a copy of this consent can be made available to me via my patient portal [facility] MyChart), or I can request a printed copy by calling the office of [facility].    I understand that my insurance will be billed for this visit.   I have read or had this consent read to me. I understand the contents of this consent, which adequately explains the benefits and risks of the Services being provided via telemedicine.  I have been provided ample opportunity to ask questions regarding this consent and the Services and have had my questions answered to my satisfaction. I give my informed consent for the services to be provided through the use of telemedicine in my medical care

## 2023-03-19 NOTE — Telephone Encounter (Signed)
   Name: Philip Chavez  DOB: 05/10/1950  MRN: [id number]  Primary Cardiologist: None   Preoperative team, please contact this patient and set up a phone call appointment for further preoperative risk assessment. Please obtain consent and complete medication review. Thank you for your help.  I confirm that guidance regarding antiplatelet and oral anticoagulation therapy has been completed and, if necessary, noted below.  Per office protocol, patient can hold Eliquis for 1 day prior to procedure. He should resume as soon as safely possible after given elevated CV risk.   I also confirmed the patient resides in the state of [place]. As per [facility] [place] [employer] telemedicine laws, the patient must reside in the state in which the provider is licensed.   Napoleon Form, Leodis Rains, NP 03/19/2023, 2:32 PM [facility]

## 2023-03-19 NOTE — Telephone Encounter (Signed)
Patient with diagnosis of afib on Eliquis for anticoagulation.    Procedure:  Colonoscopy  Date of procedure: 04/25/23   CHA2DS2-VASc Score = 6   This indicates a 9.7% annual risk of stroke. The patient's score is based upon: CHF History: 1 HTN History: 1 Diabetes History: 1 Stroke History: 2 Vascular Disease History: 0 Age Score: 1 Gender Score: 0     DM not listed on problem list A1c from 2016 is in the diabetic range at 6.7.  CVA not listed on problem list but 08/2014 head CT shows history of remote infarct. Also, has history of recurrent DVT and PE.  CrCl 53 mL/min Platelet count 171 K  Per office protocol, patient can hold Eliquis for 1 day prior to procedure. He should resume as soon as safely possible after given elevated CV risk.   **This guidance is not considered finalized until pre-operative APP has relayed final recommendations.**

## 2023-03-24 ENCOUNTER — Ambulatory Visit: Payer: Medicare PPO | Attending: Internal Medicine

## 2023-03-24 DIAGNOSIS — Z95810 Presence of automatic (implantable) cardiac defibrillator: Secondary | ICD-10-CM

## 2023-03-24 DIAGNOSIS — I5022 Chronic systolic (congestive) heart failure: Secondary | ICD-10-CM

## 2023-03-26 ENCOUNTER — Encounter (HOSPITAL_COMMUNITY): Payer: Medicare PPO

## 2023-03-26 NOTE — Progress Notes (Signed)
EPIC Encounter for ICM Monitoring  Patient Name: Philip Chavez is a 73 y.o. male Date: 03/26/2023 Primary Care Physican: Shelby Mattocks, DO Primary Cardiologist: Shirlee Latch Electrophysiologist: Ladona Ridgel BiV Pacing:  97.3%        07/26/2021 Weight: 200 lbs 09/05/2021 Weight: 200 lbs 04/01/2021 Office Weight: 195 lbs 05/08/2022 Weight: 195 lbs 06/12/2022 Weight: 195 lbs 10/31/2022 Weight: 196 lbs 02/19/2023 Weight: 195-198 03/26/2023 Weight: 195-197 lbs   Since 21-Feb-2023 Time in AT/AF <0.1 hours/day (<0.1 %)           Spoke with patient and heart failure questions reviewed.  Transmission results reviewed.  Pt asymptomatic for fluid accumulation.  Reports feeling well at this time and voices no complaints.      OptiVol Thoracic impedance suggesting normal fluid levels within the last month.    Prescribed:  Torsemide 20 mg 1 tablet by mouth once a day.   Takes PRN due to causing urinary incontinence.  Potassium 10 mEq take 1 tablet by mouth twice a day Spironolactone 25 mg take 1 tablet by mouth once a day   Labs: 01/08/2023 Creatinine 1.56, BUN 18, Potassium 4.0, Sodium 138, GFR 47 10/09/2022 Creatinine 1.45, BUN 16, Potassium 4.3, Sodium 136, GFR 52 09/23/2022 Creatinine 1.80, BUN 17, Potassium 4.2, Sodium 137, GFR 40 A complete set of results can be found in Results Review.   Recommendations:  No changes and encouraged to call if experiencing any fluid symptoms.   Follow-up plan: ICM clinic phone appointment on 04/28/2023.   91 day device clinic remote transmission 05/23/2023.     EP/Cardiology Office Visits:  Next yearly visit due 11/2023 with Dr Ladona Ridgel (no recall for 2025).  04/11/2023 with HF Clinic.      Copy of ICM check sent to Dr. Ladona Ridgel.   3 month ICM trend: 03/24/2023.    12-14 Month ICM trend:     Karie Soda, RN 03/26/2023 8:07 AM

## 2023-03-28 NOTE — Progress Notes (Signed)
 Remote ICD transmission.

## 2023-04-02 ENCOUNTER — Ambulatory Visit: Payer: Medicare PPO | Attending: Cardiovascular Disease | Admitting: Nurse Practitioner

## 2023-04-02 DIAGNOSIS — Z01810 Encounter for preprocedural cardiovascular examination: Secondary | ICD-10-CM

## 2023-04-02 NOTE — Progress Notes (Signed)
 Virtual Visit via Telephone Note   Because of Philip Chavez co-morbid illnesses, he is at least at moderate risk for complications without adequate follow up.  This format is felt to be most appropriate for this patient at this time.  Due to technical limitations with video connection (technology), today's appointment will be conducted as an audio only telehealth visit, and Philip Chavez verbally agreed to proceed in this manner.   All issues noted in this document were discussed and addressed.  No physical exam could be performed with this format.  Evaluation Performed:  Preoperative cardiovascular risk assessment _____________   Date:  04/02/2023   Patient ID:  Philip Chavez, DOB 01/23/1951, MRN [id number] Patient Location:  Home Provider location:   Office  Primary Care Provider:  Shelby Mattocks, DO Primary Cardiologist:  None  Chief Complaint / Patient Profile   73 y.o. y/o male with a h/o complete heart block s/p PPM, cardiomyopathy, paroxysmal atrial fibrillation/atrial flutter s/p prior ablation, PE/DVT, traumatic subdural hematoma, hyperlipidemia, CKD, and OSA who is pending colonoscopy on 04/25/2022 with Dr. Bosie Clos of [facility] and presents today for telephonic preoperative cardiovascular risk assessment.  History of Present Illness    Philip Chavez is a 73 y.o. male who presents via audio/video conferencing for a telehealth visit today.  Pt was last seen in cardiology clinic on 12/25/2022 by Dr. Shirlee Latch.  At that time Philip Chavez was doing well.  The patient is now pending procedure as outlined above. Since his last visit, he has done well from a cardiac standpoint.   He denies chest pain, palpitations, dyspnea, pnd, orthopnea, n, v, dizziness, syncope, edema, weight gain, or early satiety. All other systems reviewed and are otherwise negative except as noted above.   Past Medical History    Past Medical History:  Diagnosis Date   AICD (automatic  cardioverter/defibrillator) present 0919/2016   BIV    Annual physical exam 06/09/2019   Atrial flutter (HCC)    a. 08/2014 presented w/ aflutter->converted on amio;  b. CHA2DS2VASc = 2 -->Xarelto.   Atrial tachycardia (HCC)    Chronic combined systolic and diastolic CHF (congestive heart failure) (HCC)    a. 03/2013 Echo EF 50-55%;  b. 07/2013 Echo EF 20-25%, diff HK, Gr3 DD;  c. 08/2014 Echo: EF 15%, diff HK, mild AI/MR, mildly dil LA/RV, mod TR, PASP .   DVT (deep venous thrombosis) (HCC)    a. 2007 RLE: S/P ankle surgery;  b. 03/2013 LLE DVT & PE-->Xarelto.   Embolism, pulmonary with infarction (HCC)    a. 2007 RLE: S/P ankle surgery;  b. 03/2013 LLE DVT & PE-->Xarelto.   Epididymoorchitis 06/03/2022   Flank pain 05/03/2021   Glucosuria 05/03/2021   Hypercholesterolemia    Hypercoagulable state (HCC)    Hypertension    Musculoskeletal neck pain    Nonischemic cardiomyopathy (HCC)    a. 07/2013 Echo: EF 20-25%;  b. 07/2013 Myoview: Large inferior, lateral, apical scar w/ HK, no ischemia, EF 17%;  b. 08/2014 Echo: EF 15%, diff HK.   Pneumonia ~ 04/2014; 09/02/2014   Presence of permanent cardiac pacemaker    a. 03/2013 s/p MDT ADDRL1 Adapta DC PPM, ser # [id number].   Sleep apnea    does not wear mask (09/02/2014)   Third degree heart block (HCC)    a. 03/2013 s/p MDT ADDRL1 Adapta DC PPM, ser # [id number].   Urinary tract infection with hematuria 06/03/2022   Past Surgical History:  Procedure Laterality Date   BIV ICD  GENERATOR CHANGEOUT N/A 02/11/2022   Procedure: BIV ICD GENERATOR CHANGEOUT;  Surgeon: Marinus Maw, MD;  Location: [facility] INVASIVE CV LAB;  Service: Cardiovascular;  Laterality: N/A;   CARDIAC CATHETERIZATION N/A 10/17/2014   Procedure: Right/Left Heart Cath and Coronary Angiography;  Surgeon: Laurey Morale, MD;  Location: [facility] INVASIVE CV LAB;  Service: Cardiovascular;  Laterality: N/A;   CARDIOVERSION N/A 07/26/2020   Procedure: CARDIOVERSION;  Surgeon: Jodelle Red, MD;  Location: [facility] ENDOSCOPY;  Service: Cardiovascular;  Laterality: N/A;   COLONOSCOPY WITH PROPOFOL N/A 11/28/2021   Procedure: COLONOSCOPY WITH PROPOFOL;  Surgeon: Charlott Rakes, MD;  Location: [facility] ENDOSCOPY;  Service: Gastroenterology;  Laterality: N/A;   ELECTROPHYSIOLOGIC STUDY N/A 09/07/2014   Procedure: A-Flutter;  Surgeon: Marinus Maw, MD;  Location: [facility] INVASIVE CV LAB;  Service: Cardiovascular;  Laterality: N/A;   EP IMPLANTABLE DEVICE  10/24/2014   BIV   EP IMPLANTABLE DEVICE N/A 10/24/2014   Procedure: BiV ICD Upgrade;  Surgeon: Marinus Maw, MD;  Location: [facility] INVASIVE CV LAB;  Service: Cardiovascular;  Laterality: N/A;   FOOT FRACTURE SURGERY Right 2007   FRACTURE SURGERY     INGUINAL HERNIA REPAIR Right 1980's   INGUINAL HERNIA REPAIR Right 09/22/2020   Procedure: LAPAROSCOPIC RIGHT INGUINAL HERNIA REPAIR WITH MESH;  Surgeon: Axel Filler, MD;  Location: [facility] OR;  Service: General;  Laterality: Right;   INSERT / REPLACE / REMOVE PACEMAKER     MDT ADDRL1 pacemaker implanted by Dr Ladona Ridgel for complete heart block   LEAD EXTRACTION N/A 02/11/2022   Procedure: LEAD EXTRACTION;  Surgeon: Marinus Maw, MD;  Location: [facility] INVASIVE CV LAB;  Service: Cardiovascular;  Laterality: N/A;   PERMANENT PACEMAKER INSERTION N/A 03/15/2013   Procedure: PERMANENT PACEMAKER INSERTION;  Surgeon: Marinus Maw, MD;  Location: [facility] CATH LAB;  Service: Cardiovascular;  Laterality: N/A;   POLYPECTOMY  11/28/2021   Procedure: POLYPECTOMY;  Surgeon: Charlott Rakes, MD;  Location: [facility] ENDOSCOPY;  Service: Gastroenterology;;   RIGHT HEART CATH N/A 12/05/2021   Procedure: RIGHT HEART CATH;  Surgeon: Laurey Morale, MD;  Location: [facility] INVASIVE CV LAB;  Service: Cardiovascular;  Laterality: N/A;   SCLEROTHERAPY  11/28/2021   Procedure: SCLEROTHERAPY;  Surgeon: Charlott Rakes, MD;  Location: [facility] ENDOSCOPY;  Service: Gastroenterology;;   SUBMUCOSAL TATTOO INJECTION  11/28/2021   Procedure:  SUBMUCOSAL TATTOO INJECTION;  Surgeon: Charlott Rakes, MD;  Location: [facility] ENDOSCOPY;  Service: Gastroenterology;;    Allergies  No Known Allergies  Home Medications    Prior to Admission medications   Medication Sig Start Date End Date Taking? Authorizing Provider  acetaminophen (TYLENOL) 325 MG tablet Take 650 mg by mouth every 6 (six) hours as needed for moderate pain.    [provider]  amiodarone (PACERONE) 200 MG tablet Take 1 tablet (200 mg) by mouth daily. Do not take on Sundays. 11/25/22   Marinus Maw, MD  apixaban (ELIQUIS) 5 MG TABS tablet Take 1 tablet (5 mg) by mouth 2 times daily. 08/12/22   Laurey Morale, MD  atorvastatin (LIPITOR) 20 MG tablet Take 1 tablet (20 mg total) by mouth daily. NEEDS FOLLOW UP APPOINTMENT FOR MORE REFILLS 11/08/22   Laurey Morale, MD  bisoprolol (ZEBETA) 5 MG tablet Take 1 tablet (5 mg total) by mouth daily. 09/23/22   [place], Anderson Malta, FNP  dapagliflozin propanediol (FARXIGA) 10 MG TABS tablet Take 1 tablet (10 mg total) by mouth daily. 04/01/22   Laurey Morale, MD  diclofenac Sodium (  VOLTAREN) 1 % GEL Apply 1 Application topically 4 (four) times daily as needed (pain).    [provider]  digoxin (LANOXIN) 0.125 MG tablet Take 1/2 tablet (0.0625 mg total) by mouth daily. 02/03/23   Laurey Morale, MD  ferrous sulfate 324 (65 Fe) MG TBEC Take 1 tablet (325 mg total) by mouth daily. 05/13/22   Valetta Close, MD  ibuprofen (ADVIL) 600 MG tablet Take 1 tablet (600 mg total) by mouth every 8 (eight) hours as needed. 04/29/22   Vonna Drafts, MD  Multiple Vitamins-Minerals (CERTAVITE/ANTIOXIDANTS) TABS Take 1 tablet by mouth daily with breakfast.    [provider]  oxymetazoline (AFRIN) 0.05 % nasal spray Place 1 spray into both nostrils 2 (two) times daily as needed for congestion.    [provider]  potassium chloride (KLOR-CON) 10 MEQ tablet TAKE 1 TABLET BY MOUTH TWICE A DAY 04/17/22   Marinus Maw, MD  sacubitril-valsartan (ENTRESTO) 49-51 MG Take 1 tablet by mouth 2 (two) times daily. 12/25/22   Laurey Morale, MD  spironolactone (ALDACTONE) 25 MG tablet Take 1 tablet (25 mg total) by mouth daily. 11/08/22   Laurey Morale, MD  tiZANidine (ZANAFLEX) 2 MG tablet Take 1 tablet (2 mg total) by mouth 2 (two) times daily as needed for muscle spasms. 12/24/22   Shelby Mattocks, DO  torsemide (DEMADEX) 20 MG tablet Take 20 mg by mouth as needed.    [provider]  isosorbide dinitrate (ISORDIL) 10 MG tablet Take 1 tablet (10 mg total) by mouth 3 (three) times daily. 02/21/15 03/28/15  Araceli Bouche, DO    Physical Exam    Vital Signs:  Philip Chavez does not have vital signs available for review today.  Given telephonic nature of communication, physical exam is limited. AAOx3. NAD. Normal affect.  Speech and respirations are unlabored.  Accessory Clinical Findings    None  Assessment & Plan    1.  Preoperative Cardiovascular Risk Assessment:  According to the Revised Cardiac Risk Index (RCRI), his Perioperative Risk of Major Cardiac Event is (%): 0.9. His Functional Capacity in METs is: 6.61 according to the Duke Activity Status Index (DASI). Therefore, based on ACC/AHA guidelines, patient would be at acceptable risk for the planned procedure without further cardiovascular testing.   The patient was advised that if he develops new symptoms prior to surgery to contact our office to arrange for a follow-up visit, and he verbalized understanding.  Per office protocol, patient can hold Eliquis for 1 day prior to procedure. He should resume as soon as safely possible after given elevated CV risk.   A copy of this note will be routed to requesting surgeon.  Time:   Today, I have spent 5 minutes with the patient with telehealth technology discussing medical history, symptoms, and management plan.     Joylene Grapes, NP  04/02/2023, 2:13 PM

## 2023-04-03 ENCOUNTER — Other Ambulatory Visit (HOSPITAL_COMMUNITY): Payer: Self-pay

## 2023-04-03 ENCOUNTER — Other Ambulatory Visit: Payer: Self-pay | Admitting: Internal Medicine

## 2023-04-09 ENCOUNTER — Other Ambulatory Visit (HOSPITAL_COMMUNITY): Payer: Self-pay

## 2023-04-09 ENCOUNTER — Other Ambulatory Visit (HOSPITAL_COMMUNITY): Payer: Self-pay | Admitting: Cardiology

## 2023-04-09 MED ORDER — DAPAGLIFLOZIN PROPANEDIOL 10 MG PO TABS
10.0000 mg | ORAL_TABLET | Freq: Every day | ORAL | 3 refills | Status: DC
  Filled 2023-04-09: qty 30, 30d supply, fill #0
  Filled 2023-05-08: qty 30, 30d supply, fill #1
  Filled 2023-06-10: qty 30, 30d supply, fill #2
  Filled 2023-07-09: qty 30, 30d supply, fill #3

## 2023-04-11 ENCOUNTER — Ambulatory Visit (HOSPITAL_COMMUNITY)
Admission: RE | Admit: 2023-04-11 | Discharge: 2023-04-11 | Disposition: A | Discharge status: Home / Self Care | Payer: Medicare PPO | Source: Ambulatory Visit | Attending: Physician Assistant | Admitting: Physician Assistant

## 2023-04-11 ENCOUNTER — Encounter (HOSPITAL_COMMUNITY): Payer: Self-pay

## 2023-04-11 VITALS — BP 118/68 | HR 62 | Ht 73.0 in | Wt 192.6 lb

## 2023-04-11 DIAGNOSIS — I4892 Unspecified atrial flutter: Secondary | ICD-10-CM | POA: Diagnosis not present

## 2023-04-11 DIAGNOSIS — I480 Paroxysmal atrial fibrillation: Secondary | ICD-10-CM

## 2023-04-11 DIAGNOSIS — Z95810 Presence of automatic (implantable) cardiac defibrillator: Secondary | ICD-10-CM | POA: Diagnosis not present

## 2023-04-11 DIAGNOSIS — G4733 Obstructive sleep apnea (adult) (pediatric): Secondary | ICD-10-CM | POA: Diagnosis not present

## 2023-04-11 DIAGNOSIS — I428 Other cardiomyopathies: Secondary | ICD-10-CM | POA: Diagnosis not present

## 2023-04-11 DIAGNOSIS — I5022 Chronic systolic (congestive) heart failure: Secondary | ICD-10-CM | POA: Insufficient documentation

## 2023-04-11 DIAGNOSIS — Z86711 Personal history of pulmonary embolism: Secondary | ICD-10-CM | POA: Insufficient documentation

## 2023-04-11 DIAGNOSIS — Z7901 Long term (current) use of anticoagulants: Secondary | ICD-10-CM | POA: Diagnosis not present

## 2023-04-11 DIAGNOSIS — Z86718 Personal history of other venous thrombosis and embolism: Secondary | ICD-10-CM | POA: Diagnosis not present

## 2023-04-11 DIAGNOSIS — N1831 Chronic kidney disease, stage 3a: Secondary | ICD-10-CM | POA: Diagnosis not present

## 2023-04-11 DIAGNOSIS — Z79899 Other long term (current) drug therapy: Secondary | ICD-10-CM | POA: Insufficient documentation

## 2023-04-11 DIAGNOSIS — I4891 Unspecified atrial fibrillation: Secondary | ICD-10-CM | POA: Insufficient documentation

## 2023-04-11 DIAGNOSIS — I442 Atrioventricular block, complete: Secondary | ICD-10-CM | POA: Diagnosis not present

## 2023-04-11 DIAGNOSIS — I472 Ventricular tachycardia, unspecified: Secondary | ICD-10-CM | POA: Insufficient documentation

## 2023-04-11 NOTE — Patient Instructions (Addendum)
 Thank you for coming in today  If you had labs drawn today, any labs that are abnormal the clinic will call you No news is good news  Medications: No changes  Follow up appointments: Your physician recommends that you return for lab work in: HOLD Digoxin that day 04/18/2023   Your physician recommends that you schedule a follow-up appointment in:  4 months in clinic   Do the following things EVERYDAY: Weigh yourself in the morning before breakfast. Write it down and keep it in a log. Take your medicines as prescribed Eat low salt foods--Limit salt (sodium) to 2000 mg per day.  Stay as active as you can everyday Limit all fluids for the day to less than 2 liters   At the [facility], you and your health needs are our priority. As part of our continuing mission to provide you with exceptional heart care, we have created designated Provider Care Teams. These Care Teams include your primary Cardiologist (physician) and Advanced Practice Providers (APPs- Physician Assistants and Nurse Practitioners) who all work together to provide you with the care you need, when you need it.   You may see any of the following providers on your designated Care Team at your next follow up: Dr Arvilla Meres Dr Marca Ancona Dr. Marcos Eke, NP Robbie Lis, [place] [facility] [place], [place] Brynda Peon, NP Karle Plumber, PharmD   Please be sure to bring in all your medications bottles to every appointment.    Thank you for choosing [facility]  If you have any questions or concerns before your next appointment please send [place] a message through [place] or call our office at [phone].    TO LEAVE A MESSAGE FOR THE NURSE SELECT OPTION 2, PLEASE LEAVE A MESSAGE INCLUDING: YOUR NAME DATE OF BIRTH CALL BACK NUMBER REASON FOR CALL**this is important as we prioritize the call backs  YOU WILL RECEIVE A CALL BACK  THE SAME DAY AS LONG AS YOU CALL BEFORE 4:00 PM
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
## 2023-04-14 NOTE — Addendum Note (Signed)
 Encounter addended by: Andrey Farmer, PA-C on: 04/14/2023 6:51 AM  Actions taken: Clinical Note Signed

## 2023-04-15 ENCOUNTER — Other Ambulatory Visit (HOSPITAL_COMMUNITY): Payer: Self-pay

## 2023-04-18 ENCOUNTER — Ambulatory Visit (HOSPITAL_COMMUNITY)
Admission: RE | Admit: 2023-04-18 | Discharge: 2023-04-18 | Disposition: A | Discharge status: Home / Self Care | Source: Ambulatory Visit | Attending: Cardiology | Admitting: Cardiology

## 2023-04-18 DIAGNOSIS — I5022 Chronic systolic (congestive) heart failure: Secondary | ICD-10-CM | POA: Diagnosis not present

## 2023-04-18 LAB — BRAIN NATRIURETIC PEPTIDE: B Natriuretic Peptide: 646.5 pg/mL — ABNORMAL HIGH (ref 0.0–100.0)

## 2023-04-18 LAB — COMPREHENSIVE METABOLIC PANEL
ALT: 19 U/L (ref 0–44)
AST: 21 U/L (ref 15–41)
Albumin: 3.8 g/dL (ref 3.5–5.0)
Alkaline Phosphatase: 63 U/L (ref 38–126)
Anion gap: 10 (ref 5–15)
BUN: 18 mg/dL (ref 8–23)
CO2: 24 mmol/L (ref 22–32)
Calcium: 9 mg/dL (ref 8.9–10.3)
Chloride: 105 mmol/L (ref 98–111)
Creatinine, Ser: 1.58 mg/dL — ABNORMAL HIGH (ref 0.61–1.24)
GFR, Estimated: 46 mL/min — ABNORMAL LOW (ref >60)
Glucose, Bld: 93 mg/dL (ref 70–99)
Potassium: 4.3 mmol/L (ref 3.5–5.1)
Sodium: 139 mmol/L (ref 135–145)
Total Bilirubin: 0.8 mg/dL (ref 0.0–1.2)
Total Protein: 6.9 g/dL (ref 6.5–8.1)

## 2023-04-18 LAB — DIGOXIN LEVEL: Digoxin Level: 0.5 ng/mL — ABNORMAL LOW (ref 0.8–2.0)

## 2023-04-18 LAB — TSH: TSH: 0.76 u[IU]/mL (ref 0.350–4.500)

## 2023-04-25 ENCOUNTER — Other Ambulatory Visit (HOSPITAL_COMMUNITY): Payer: Self-pay

## 2023-04-25 MED ORDER — PEG 3350-KCL-NA BICARB-NACL 420 G PO SOLR
ORAL | 0 refills | Status: DC
  Filled 2023-04-25: qty 8000, 2d supply, fill #0

## 2023-04-25 MED ORDER — BISACODYL 5 MG PO TBEC
DELAYED_RELEASE_TABLET | ORAL | 0 refills | Status: DC
  Filled 2023-04-25: qty 8, 2d supply, fill #0

## 2023-04-27 NOTE — Anesthesia Preprocedure Evaluation (Signed)
 Anesthesia Evaluation  Patient identified by MRN, date of birth, ID band Patient awake    Reviewed: Allergy & Precautions, NPO status , Patient's Chart, lab work & pertinent test results  Airway Mallampati: II  TM Distance: >3 FB Neck ROM: Full    Dental no notable dental hx. (+) Teeth Intact, Dental Advisory Given   Pulmonary former smoker, PE (2007)   Pulmonary exam normal breath sounds clear to auscultation       Cardiovascular hypertension, Pt. on medications and Pt. on home beta blockers (-) angina +CHF (NICM HFrEF)  (-) Past MI Normal cardiovascular exam+ dysrhythmias (on Eliquis) Atrial Fibrillation + pacemaker + Cardiac Defibrillator  Rhythm:Regular Rate:Normal  10/09/2022 Echo 1. Left ventricular ejection fraction, by estimation, is <20%. The left  ventricle has severely decreased function. The left ventricle demonstrates  global hypokinesis. The left ventricular internal cavity size was severely  dilated. Left ventricular  diastolic parameters are consistent with Grade III diastolic dysfunction  (restrictive).   2. Right ventricular systolic function is moderately reduced. The right  ventricular size is normal.   3. Left atrial size was moderately dilated.   4. The mitral valve is normal in structure. Moderate mitral valve  regurgitation. No evidence of mitral stenosis.   5. Tricuspid valve regurgitation is mild to moderate.   6. The aortic valve is normal in structure. Aortic valve regurgitation is  mild. No aortic stenosis is present.   7. Aortic dilatation noted. There is borderline dilatation of the  ascending aorta, measuring 38 mm. There is mild dilatation of the aortic  root, measuring 43 mm.   8. The inferior vena cava is normal in size with greater than 50%  respiratory variability, suggesting right atrial pressure of 3 mmHg.     Neuro/Psych    GI/Hepatic   Endo/Other    Renal/GU Renal diseaseLab  Results      Component                Value               Date                        K                        4.3                 04/18/2023                CO2                      24                  04/18/2023                BUN                      18                  04/18/2023                CREATININE               1.58 (H)            04/18/2023                     Musculoskeletal  Abdominal   Peds  Hematology Lab Results      Component                Value               Date                        HGB                      15.5                09/23/2022                HCT                      47.5                09/23/2022                 PLT                      171                 09/23/2022              Anesthesia Other Findings   Reproductive/Obstetrics                             Anesthesia Physical Anesthesia Plan  ASA: 4  Anesthesia Plan: MAC   Post-op Pain Management:    Induction: Intravenous  PONV Risk Score and Plan: 2 and Propofol infusion and Treatment may vary due to age or medical condition  Airway Management Planned: Natural Airway and Nasal Cannula  Additional Equipment: None  Intra-op Plan:   Post-operative Plan:   Informed Consent: I have reviewed the patients History and Physical, chart, labs and discussed the procedure including the risks, benefits and alternatives for the proposed anesthesia with the patient or authorized representative who has indicated his/her understanding and acceptance.     Dental advisory given  Plan Discussed with: CRNA and Surgeon  Anesthesia Plan Comments: (Colonoscopy for history of colon polyps)       Anesthesia Quick Evaluation

## 2023-04-28 ENCOUNTER — Encounter (HOSPITAL_COMMUNITY): Payer: Self-pay | Admitting: Gastroenterology

## 2023-04-28 ENCOUNTER — Other Ambulatory Visit: Payer: Self-pay

## 2023-04-28 ENCOUNTER — Ambulatory Visit (HOSPITAL_BASED_OUTPATIENT_CLINIC_OR_DEPARTMENT_OTHER): Payer: Self-pay | Admitting: Anesthesiology

## 2023-04-28 ENCOUNTER — Ambulatory Visit (HOSPITAL_COMMUNITY)
Admission: RE | Admit: 2023-04-28 | Discharge: 2023-04-28 | Disposition: A | Discharge status: Home / Self Care | Payer: Medicare PPO | Attending: Gastroenterology | Admitting: Gastroenterology

## 2023-04-28 ENCOUNTER — Ambulatory Visit: Payer: Medicare PPO | Attending: Internal Medicine

## 2023-04-28 ENCOUNTER — Encounter (HOSPITAL_COMMUNITY)
Admission: RE | Disposition: A | Discharge status: Home / Self Care | Payer: Self-pay | Source: Home / Self Care | Attending: Gastroenterology

## 2023-04-28 ENCOUNTER — Ambulatory Visit (HOSPITAL_COMMUNITY): Payer: Self-pay | Admitting: Anesthesiology

## 2023-04-28 DIAGNOSIS — D124 Benign neoplasm of descending colon: Secondary | ICD-10-CM

## 2023-04-28 DIAGNOSIS — Z1211 Encounter for screening for malignant neoplasm of colon: Secondary | ICD-10-CM | POA: Diagnosis not present

## 2023-04-28 DIAGNOSIS — I10 Essential (primary) hypertension: Secondary | ICD-10-CM

## 2023-04-28 DIAGNOSIS — K5730 Diverticulosis of large intestine without perforation or abscess without bleeding: Secondary | ICD-10-CM | POA: Diagnosis not present

## 2023-04-28 DIAGNOSIS — Z7901 Long term (current) use of anticoagulants: Secondary | ICD-10-CM | POA: Diagnosis not present

## 2023-04-28 DIAGNOSIS — N183 Chronic kidney disease, stage 3 unspecified: Secondary | ICD-10-CM | POA: Diagnosis not present

## 2023-04-28 DIAGNOSIS — K640 First degree hemorrhoids: Secondary | ICD-10-CM | POA: Diagnosis not present

## 2023-04-28 DIAGNOSIS — Z860100 Personal history of colon polyps, unspecified: Secondary | ICD-10-CM | POA: Diagnosis not present

## 2023-04-28 DIAGNOSIS — I5022 Chronic systolic (congestive) heart failure: Secondary | ICD-10-CM

## 2023-04-28 DIAGNOSIS — Z87891 Personal history of nicotine dependence: Secondary | ICD-10-CM | POA: Diagnosis not present

## 2023-04-28 DIAGNOSIS — K635 Polyp of colon: Secondary | ICD-10-CM | POA: Diagnosis not present

## 2023-04-28 DIAGNOSIS — Z95810 Presence of automatic (implantable) cardiac defibrillator: Secondary | ICD-10-CM

## 2023-04-28 DIAGNOSIS — Z09 Encounter for follow-up examination after completed treatment for conditions other than malignant neoplasm: Secondary | ICD-10-CM | POA: Diagnosis not present

## 2023-04-28 DIAGNOSIS — K649 Unspecified hemorrhoids: Secondary | ICD-10-CM | POA: Diagnosis not present

## 2023-04-28 DIAGNOSIS — I130 Hypertensive heart and chronic kidney disease with heart failure and stage 1 through stage 4 chronic kidney disease, or unspecified chronic kidney disease: Secondary | ICD-10-CM | POA: Diagnosis not present

## 2023-04-28 DIAGNOSIS — Z860101 Personal history of adenomatous and serrated colon polyps: Secondary | ICD-10-CM

## 2023-04-28 DIAGNOSIS — I509 Heart failure, unspecified: Secondary | ICD-10-CM | POA: Diagnosis not present

## 2023-04-28 HISTORY — PX: POLYPECTOMY: SHX5525

## 2023-04-28 HISTORY — PX: COLONOSCOPY WITH PROPOFOL: SHX5780

## 2023-04-28 SURGERY — COLONOSCOPY WITH PROPOFOL
Anesthesia: Monitor Anesthesia Care

## 2023-04-28 MED ORDER — LACTATED RINGERS IV SOLN
INTRAVENOUS | Status: AC | PRN
  Administered 2023-04-28: 10 mL/h via INTRAVENOUS

## 2023-04-28 MED ORDER — EPHEDRINE SULFATE (PRESSORS) 50 MG/ML IJ SOLN
INTRAMUSCULAR | Status: DC | PRN
  Administered 2023-04-28: 10 mg via INTRAVENOUS

## 2023-04-28 MED ORDER — PHENYLEPHRINE HCL (PRESSORS) 10 MG/ML IV SOLN
INTRAVENOUS | Status: DC | PRN
Start: 2023-04-28 — End: 2023-04-28
  Administered 2023-04-28 (×2): 160 ug via INTRAVENOUS

## 2023-04-28 MED ORDER — PROPOFOL 10 MG/ML IV BOLUS
INTRAVENOUS | Status: DC | PRN
  Administered 2023-04-28: 40 mg via INTRAVENOUS
  Administered 2023-04-28: 30 mg via INTRAVENOUS
  Administered 2023-04-28: 40 mg via INTRAVENOUS
  Administered 2023-04-28 (×4): 30 mg via INTRAVENOUS
  Administered 2023-04-28 (×2): 20 mg via INTRAVENOUS

## 2023-04-28 SURGICAL SUPPLY — 20 items
ELECT REM PT RETURN 9FT ADLT (ELECTROSURGICAL) IMPLANT
ELECTRODE REM PT RTRN 9FT ADLT (ELECTROSURGICAL) IMPLANT
FLOOR PAD 36X40 (MISCELLANEOUS) ×2 IMPLANT
FORCEPS BIOP RAD 4 LRG CAP 4 (CUTTING FORCEPS) IMPLANT
FORCEPS BIOP RJ4 240 W/NDL (CUTTING FORCEPS) IMPLANT
FORCEPS BXJMBJMB 240X2.8X (CUTTING FORCEPS) IMPLANT
INJECTOR/SNARE I SNARE (MISCELLANEOUS) IMPLANT
LUBRICANT JELLY 4.5OZ STERILE (MISCELLANEOUS) IMPLANT
MANIFOLD NEPTUNE II (INSTRUMENTS) IMPLANT
NDL SCLEROTHERAPY 25GX240 (NEEDLE) IMPLANT
NEEDLE SCLEROTHERAPY 25GX240 (NEEDLE) IMPLANT
PAD FLOOR 36X40 (MISCELLANEOUS) ×3 IMPLANT
PROBE APC STR FIRE (PROBE) IMPLANT
PROBE INJECTION GOLD 7FR (MISCELLANEOUS) IMPLANT
SNARE ROTATE MED OVAL 20MM (MISCELLANEOUS) IMPLANT
SYR 50ML LL SCALE MARK (SYRINGE) IMPLANT
TRAP SPECIMEN MUCOUS 40CC (MISCELLANEOUS) IMPLANT
TUBING ENDO SMARTCAP PENTAX (MISCELLANEOUS) IMPLANT
TUBING IRRIGATION ENDOGATOR (MISCELLANEOUS) ×3 IMPLANT
WATER STERILE IRR 1000ML POUR (IV SOLUTION) IMPLANT

## 2023-04-28 NOTE — Interval H&P Note (Signed)
 History and Physical Interval Note:  04/28/2023 7:19 AM  Philip Chavez  has presented today for surgery, with the diagnosis of History of colon polyps.  The various methods of treatment have been discussed with the patient and family. After consideration of risks, benefits and other options for treatment, the patient has consented to  Procedure(s): COLONOSCOPY WITH PROPOFOL (N/A) as a surgical intervention.  The patient's history has been reviewed, patient examined, no change in status, stable for surgery.  I have reviewed the patient's chart and labs.  Questions were answered to the patient's satisfaction.     Shirley Friar

## 2023-04-28 NOTE — Discharge Instructions (Addendum)
 YOU HAD AN ENDOSCOPIC PROCEDURE TODAY: Refer to the procedure report and other information in the discharge instructions given to you for any specific questions about what was found during the examination. If this information does not answer your questions, please call [facility] office at [phone] to clarify.   YOU SHOULD EXPECT: Some feelings of bloating in the abdomen. Passage of more gas than usual. Walking can help get rid of the air that was put into your GI tract during the procedure and reduce the bloating. If you had a lower endoscopy (such as a colonoscopy or flexible sigmoidoscopy) you may notice spotting of blood in your stool or on the toilet paper. Some abdominal soreness may be present for a day or two, also.  DIET: Your first meal following the procedure should be a light meal and then it is ok to progress to your normal diet. A half-sandwich or bowl of soup is an example of a good first meal. Heavy or fried foods are harder to digest and may make you feel nauseous or bloated. Drink plenty of fluids but you should avoid alcoholic beverages for 24 hours. If you had a esophageal dilation, please see attached instructions for diet.    ACTIVITY: Your care partner should take you home directly after the procedure. You should plan to take it easy, moving slowly for the rest of the day. You can resume normal activity the day after the procedure however YOU SHOULD NOT DRIVE, use power tools, machinery or perform tasks that involve climbing or major physical exertion for 24 hours (because of the sedation medicines used during the test).   SYMPTOMS TO REPORT IMMEDIATELY: A gastroenterologist can be reached at any hour. Please call [phone]  for any of the following symptoms:  Following lower endoscopy (colonoscopy, flexible sigmoidoscopy) Excessive amounts of blood in the stool  Significant tenderness, worsening of abdominal pains  Swelling of the abdomen that is new, acute  Fever of 100  or higher    FOLLOW UP:  If any biopsies were taken you will be contacted by phone or by letter within the next 1-3 weeks. Call [phone]  if you have not heard about the biopsies in 3 weeks.  Please also call with any specific questions about appointments or follow up tests. YOU HAD AN ENDOSCOPIC PROCEDURE TODAY: Refer to the procedure report and other information in the discharge instructions given to you for any specific questions about what was found during the examination. If this information does not answer your questions, please call [facility] office at [phone] to clarify.   CONTINUE TO HOLD ELIQUIS until Wed April 30, 2023 and then resume that day.

## 2023-04-28 NOTE — H&P (Signed)
 Date of Initial H&P: 04/23/23  History reviewed, patient examined, no change in status, stable for surgery.

## 2023-04-28 NOTE — Op Note (Signed)
 [facility] Patient Name: Philip Chavez Procedure Date : 04/28/2023 MRN: [id number] Attending MD: Shirley Friar , MD, [id number] Date of Birth: 06-14-1950 CSN: [id number] Age: 73 Admit Type: Inpatient Procedure:                Colonoscopy Indications:              High risk colon cancer surveillance: Personal                            history of adenoma (10 mm or greater in size), Last                            colonoscopy: October 2023 Providers:                Shirley Friar, MD, Martha Clan, RN,                            Rozetta Nunnery, Technician Referring MD:             Doylene Canning. Ladona Ridgel, MD Medicines:                Propofol per Anesthesia, Monitored Anesthesia Care Complications:            No immediate complications. Estimated Blood Loss:     Estimated blood loss was minimal. Procedure:                Pre-Anesthesia Assessment:                           - Prior to the procedure, a History and Physical                            was performed, and patient medications and                            allergies were reviewed. The patient's tolerance of                            previous anesthesia was also reviewed. The risks                            and benefits of the procedure and the sedation                            options and risks were discussed with the patient.                            All questions were answered, and informed consent                            was obtained. Prior Anticoagulants: The patient has                            taken Eliquis (apixaban), last dose was 2 days  prior to procedure. ASA Grade Assessment: IV - A                            patient with severe systemic disease that is a                            constant threat to life. After reviewing the risks                            and benefits, the patient was deemed in                            satisfactory condition to  undergo the procedure.                           After obtaining informed consent, the colonoscope                            was passed under direct vision. Throughout the                            procedure, the patient's blood pressure, pulse, and                            oxygen saturations were monitored continuously. The                            [id number]-HQ190TL ([id number]) Olympus peds colonoscope was                            introduced through the anus and advanced to the the                            cecum, identified by appendiceal orifice and                            ileocecal valve. The colonoscopy was performed with                            difficulty due to significant looping and fair                            prep. Successful completion of the procedure was                            aided by straightening and shortening the scope to                            obtain bowel loop reduction, using scope torsion,                            applying abdominal pressure and lavage. The patient  tolerated the procedure well. The quality of the                            bowel preparation was fair and fair but repeated                            irrigation led to a good and adequate prep. The                            ileocecal valve, appendiceal orifice, and rectum                            were photographed. Scope In: 7:40:17 AM Scope Out: 8:03:00 AM Scope Withdrawal Time: 0 hours 14 minutes 10 seconds  Total Procedure Duration: 0 hours 22 minutes 43 seconds  Findings:      The perianal and digital rectal examinations were normal.      A 4 mm polyp was found in the descending colon. The polyp was sessile.       The polyp was removed with a hot snare. Resection and retrieval were       complete. Estimated blood loss: none.      A 2 mm polyp was found in the descending colon near old tattoo site. The       polyp was semi-sessile. The polyp was  removed with a cold biopsy       forceps. Resection and retrieval were complete. Estimated blood loss was       minimal.      A tattoo was seen in the descending colon.      Scattered small-mouthed diverticula were found in the sigmoid colon.      Internal hemorrhoids were found during retroflexion. The hemorrhoids       were medium-sized and Grade I (internal hemorrhoids that do not       prolapse). Impression:               - Preparation of the colon was fair.                           - One 4 mm polyp in the descending colon, removed                            with a hot snare. Resected and retrieved.                           - One 2 mm polyp in the descending colon, removed                            with a cold biopsy forceps. Resected and retrieved.                           - A tattoo was seen in the descending colon.                           - Diverticulosis in the sigmoid colon.                           -  Internal hemorrhoids. Recommendation:           - Patient has a contact number available for                            emergencies. The signs and symptoms of potential                            delayed complications were discussed with the                            patient. Return to normal activities tomorrow.                            Written discharge instructions were provided to the                            patient.                           - High fiber diet.                           - Await pathology results.                           - Resume Eliquis (apixaban) at prior dose in 2 days.                           - Repeat colonoscopy for surveillance based on                            pathology results. Procedure Code(s):        --- Professional ---                           [phone], Colonoscopy, flexible; with removal of                            tumor(s), polyp(s), or other lesion(s) by snare                            technique                            45380, 59, Colonoscopy, flexible; with biopsy,                            single or multiple Diagnosis Code(s):        --- Professional ---                           Z86.010, Personal history of colonic polyps                           D12.4, Benign neoplasm of descending colon  K64.0, First degree hemorrhoids                           K57.30, Diverticulosis of large intestine without                            perforation or abscess without bleeding CPT copyright 2022 American Medical Association. All rights reserved. The codes documented in this report are preliminary and upon coder review may  be revised to meet current compliance requirements. Shirley Friar, MD 04/28/2023 8:20:50 AM This report has been signed electronically. Number of Addenda: 0

## 2023-04-28 NOTE — Transfer of Care (Signed)
 Immediate Anesthesia Transfer of Care Note  Patient: Philip Chavez  Procedure(s) Performed: COLONOSCOPY WITH PROPOFOL POLYPECTOMY  Patient Location: Endoscopy Unit  Anesthesia Type:MAC  Level of Consciousness: awake, alert , and oriented  Airway & Oxygen Therapy: Patient Spontanous Breathing  Post-op Assessment: Report given to RN and Post -op Vital signs reviewed and stable  Post vital signs: Reviewed and stable  Last Vitals:  Vitals Value Taken Time  BP 82/38 04/28/23 0807  Temp 36.5 C 04/28/23 0807  Pulse 63 04/28/23 0809  Resp 28 04/28/23 0809  SpO2 96 % 04/28/23 0809  Vitals shown include unfiled device data.  Last Pain:  Vitals:   04/28/23 0807  TempSrc: Temporal  PainSc: 0-No pain         Complications: No notable events documented.

## 2023-04-28 NOTE — Anesthesia Postprocedure Evaluation (Signed)
 Anesthesia Post Note  Patient: Philip Chavez  Procedure(s) Performed: COLONOSCOPY WITH PROPOFOL POLYPECTOMY     Patient location during evaluation: Endoscopy Anesthesia Type: MAC Level of consciousness: awake and alert Pain management: pain level controlled Vital Signs Assessment: post-procedure vital signs reviewed and stable Respiratory status: spontaneous breathing, nonlabored ventilation, respiratory function stable and patient connected to nasal cannula oxygen Cardiovascular status: blood pressure returned to baseline and stable Postop Assessment: no apparent nausea or vomiting Anesthetic complications: no  No notable events documented.  Last Vitals:  Vitals:   04/28/23 0817 04/28/23 0827  BP: 98/66 94/69  Pulse: 66 65  Resp: 20 19  Temp:    SpO2: 94% 96%    Last Pain:  Vitals:   04/28/23 0827  TempSrc:   PainSc: 0-No pain                 Trevor Iha

## 2023-04-29 ENCOUNTER — Other Ambulatory Visit (HOSPITAL_COMMUNITY): Payer: Self-pay

## 2023-04-29 ENCOUNTER — Other Ambulatory Visit: Payer: Self-pay

## 2023-04-29 ENCOUNTER — Encounter (HOSPITAL_COMMUNITY): Payer: Self-pay | Admitting: Gastroenterology

## 2023-04-29 LAB — SURGICAL PATHOLOGY

## 2023-04-30 NOTE — Progress Notes (Signed)
 EPIC Encounter for ICM Monitoring  Patient Name: Philip Chavez is a 73 y.o. male Date: 04/30/2023 Primary Care Physican: Shelby Mattocks, DO Primary Cardiologist: Shirlee Latch Electrophysiologist: Ladona Ridgel BiV Pacing:  98.1%        05/08/2022 Weight: 195 lbs 06/12/2022 Weight: 195 lbs 10/31/2022 Weight: 196 lbs 02/19/2023 Weight: 195-198 03/26/2023 Weight: 195-197 lbs 04/30/2023 Weight: 195-198 lbs   Since 11-Apr-2023 Time in AT/AF 0.0 hours/day (0.0 %)          Spoke with patient and heart failure questions reviewed.  Transmission results reviewed.  Pt asymptomatic for fluid accumulation.  Reports feeling well at this time and voices no complaints.      OptiVol Thoracic impedance suggesting normal fluid levels within the last month.    Prescribed:  Torsemide 20 mg 1 tablet by mouth once a day.   Takes PRN due to causing urinary incontinence.  Potassium 10 mEq take 1 tablet by mouth twice a day Spironolactone 25 mg take 1 tablet by mouth once a day   Labs: 04/18/2023 Creatinine 1.58, BUN 18, Potassium 4.3, Sodium 139, GFR 46 01/08/2023 Creatinine 1.56, BUN 18, Potassium 4.0, Sodium 138, GFR 47 A complete set of results can be found in Results Review.   Recommendations:  No changes and encouraged to call if experiencing any fluid symptoms.   Follow-up plan: ICM clinic phone appointment on 06/02/2023.   91 day device clinic remote transmission 05/23/2023.     EP/Cardiology Office Visits:  Next yearly visit due 11/2023 with Dr Ladona Ridgel (no recall for 2025).  08/11/2023 with [facility] Clinic.      Copy of ICM check sent to Dr. Ladona Ridgel.    3 month ICM trend: 04/28/2023.    12-14 Month ICM trend:     Karie Soda, RN 04/30/2023 12:51 PM

## 2023-05-03 ENCOUNTER — Other Ambulatory Visit (HOSPITAL_COMMUNITY): Payer: Self-pay

## 2023-05-07 ENCOUNTER — Other Ambulatory Visit (HOSPITAL_COMMUNITY): Payer: Self-pay

## 2023-05-07 ENCOUNTER — Other Ambulatory Visit (HOSPITAL_COMMUNITY): Payer: Self-pay | Admitting: Cardiology

## 2023-05-07 MED ORDER — ATORVASTATIN CALCIUM 20 MG PO TABS
20.0000 mg | ORAL_TABLET | Freq: Every day | ORAL | 3 refills | Status: AC
  Filled 2023-05-07: qty 90, 90d supply, fill #0
  Filled 2023-08-07: qty 90, 90d supply, fill #1
  Filled 2023-11-06: qty 90, 90d supply, fill #2
  Filled 2024-02-06: qty 90, 90d supply, fill #3

## 2023-05-08 ENCOUNTER — Other Ambulatory Visit: Payer: Self-pay

## 2023-05-08 ENCOUNTER — Other Ambulatory Visit (HOSPITAL_COMMUNITY): Payer: Self-pay | Admitting: Cardiology

## 2023-05-08 ENCOUNTER — Other Ambulatory Visit (HOSPITAL_COMMUNITY): Payer: Self-pay

## 2023-05-08 MED ORDER — SPIRONOLACTONE 25 MG PO TABS
25.0000 mg | ORAL_TABLET | Freq: Every day | ORAL | 1 refills | Status: DC
  Filled 2023-05-08: qty 90, 90d supply, fill #0
  Filled 2023-08-07: qty 90, 90d supply, fill #1

## 2023-05-19 ENCOUNTER — Encounter: Payer: Self-pay | Admitting: Student

## 2023-05-19 ENCOUNTER — Ambulatory Visit: Admitting: Student

## 2023-05-19 VITALS — BP 89/51 | HR 68 | Ht 73.0 in | Wt 187.6 lb

## 2023-05-19 DIAGNOSIS — R350 Frequency of micturition: Secondary | ICD-10-CM

## 2023-05-19 LAB — POCT URINALYSIS DIP (MANUAL ENTRY)
Bilirubin, UA: NEGATIVE
Blood, UA: NEGATIVE
Glucose, UA: >=1000 mg/dL — AB
Ketones, POC UA: NEGATIVE mg/dL
Leukocytes, UA: NEGATIVE
Nitrite, UA: NEGATIVE
Protein Ur, POC: NEGATIVE mg/dL
Spec Grav, UA: 1.015 (ref 1.010–1.025)
Urobilinogen, UA: 1 U/dL
pH, UA: 7 (ref 5.0–8.0)

## 2023-05-19 LAB — POCT GLYCOSYLATED HEMOGLOBIN (HGB A1C): Hemoglobin A1C: 5.5 % (ref 4.0–5.6)

## 2023-05-19 NOTE — Progress Notes (Signed)
    SUBJECTIVE:   CHIEF COMPLAINT / HPI:   73 year old male patient with history of HFrEF, A-fib on Eliquis third-degree heart block and CKD 3 presents with increased urinary frequency. He denies any associated pain or burning sensation during urination. He also denies increased water intake but mentions that he ensures to drink plenty of water. He reports that sometimes his urination comes to a stop and then he has to restart. He has no known history of an enlarged prostate.   PERTINENT  PMH / PSH: Reviewed  OBJECTIVE:   BP (!) 89/51   Pulse 68   Ht 6\' 1"  (1.854 m)   Wt 187 lb 9.6 oz (85.1 kg)   SpO2 100%   BMI 24.75 kg/m    Physical Exam General: Alert, well appearing, NAD Cardiovascular: RRR, No Murmurs, Normal S2/S2 Respiratory: CTAB, No wheezing or Rales Abdomen: No distension or tenderness Extremities: No edema on extremities    ASSESSMENT/PLAN:    Increased urinary frequency Increased frequency without dysuria or discharge.  Differentials for increased urinary frequency include UTI, hyperglycemia, BPH or medication.  Suspect patient's symptoms are likely secondary to medication as patient is currently on Farxiga and torsemide for heart failure.  Less likely hyperglycemia given normal A1c of 5.5%. Discussed prostatic enlargement as a potential cause. - Obtain urine sample to test for UTI. - Check blood glucose levels. - Informed patient increased urinary frequency is likely from his HF medications  Self-medication with ibuprofen Frequently uses ibuprofen, acetaminophen, and diphenhydramine. Advised against frequent ibuprofen use due to gastrointestinal bleeding risk.  Hypotension BP borderline hypotensive.  Currently asymptomatic.  On multiple blood pressure lowering medications.  If continued hypertensive BP reading could consider lowering dose of spironolactone or Entresto.   Goble Last, MD [facility]

## 2023-05-19 NOTE — Patient Instructions (Signed)
 Pleasure to meet you today.  Today we have ordered labs to check your sugar and your kidney for possible urinary infection.  Also some of the medications you are taking such as your torsemide and Farxiga can actually increase your urinary frequency.  Suspect this is most likely why you are peeing a lot.

## 2023-05-20 ENCOUNTER — Telehealth: Payer: Self-pay | Admitting: Student

## 2023-05-20 NOTE — Telephone Encounter (Signed)
 Informed patient he is UA was negative for UTI however did show glycosuria.  From the patient his glycosuria is most likely due to Farxiga which he is taking for his heart failure and also emphasized his urinary frequency is mostly due to his medications including Farxiga and torsemide.  Patient verbalized understanding and appreciative of the call.

## 2023-05-23 ENCOUNTER — Ambulatory Visit (INDEPENDENT_AMBULATORY_CARE_PROVIDER_SITE_OTHER): Payer: Medicare PPO

## 2023-05-23 DIAGNOSIS — I428 Other cardiomyopathies: Secondary | ICD-10-CM | POA: Diagnosis not present

## 2023-05-24 LAB — CUP PACEART REMOTE DEVICE CHECK
Battery Remaining Longevity: 112 mo
Battery Voltage: 3 V
Brady Statistic RV Percent Paced: 0 %
Date Time Interrogation Session: 20250418031057
HighPow Impedance: 58 Ohm
Lead Channel Impedance Value: 285 Ohm
Lead Channel Impedance Value: 342 Ohm
Lead Channel Impedance Value: 361 Ohm
Lead Channel Impedance Value: 361 Ohm
Lead Channel Impedance Value: 380 Ohm
Lead Channel Impedance Value: 646 Ohm
Lead Channel Pacing Threshold Amplitude: 0.625 V
Lead Channel Pacing Threshold Pulse Width: 0.4 ms
Lead Channel Sensing Intrinsic Amplitude: 13.8 mV
Lead Channel Sensing Intrinsic Amplitude: 2.9 mV
Lead Channel Setting Pacing Amplitude: 2 V
Lead Channel Setting Pacing Amplitude: 2 V
Lead Channel Setting Pacing Pulse Width: 0.4 ms
Lead Channel Setting Sensing Sensitivity: 0.3 mV
Zone Setting Status: 755011
Zone Setting Status: 755011
Zone Setting Status: 755011

## 2023-05-28 ENCOUNTER — Other Ambulatory Visit (HOSPITAL_COMMUNITY): Payer: Self-pay

## 2023-06-02 ENCOUNTER — Ambulatory Visit: Attending: Internal Medicine

## 2023-06-02 DIAGNOSIS — Z95810 Presence of automatic (implantable) cardiac defibrillator: Secondary | ICD-10-CM

## 2023-06-02 DIAGNOSIS — I5022 Chronic systolic (congestive) heart failure: Secondary | ICD-10-CM | POA: Diagnosis not present

## 2023-06-05 ENCOUNTER — Other Ambulatory Visit: Payer: Self-pay | Admitting: Cardiology

## 2023-06-05 ENCOUNTER — Other Ambulatory Visit (HOSPITAL_COMMUNITY): Payer: Self-pay

## 2023-06-05 ENCOUNTER — Other Ambulatory Visit: Payer: Self-pay

## 2023-06-06 ENCOUNTER — Other Ambulatory Visit (HOSPITAL_COMMUNITY): Payer: Self-pay

## 2023-06-06 ENCOUNTER — Other Ambulatory Visit (HOSPITAL_COMMUNITY): Payer: Self-pay | Admitting: Cardiology

## 2023-06-06 MED ORDER — DIGOXIN 125 MCG PO TABS
0.0625 mg | ORAL_TABLET | Freq: Every day | ORAL | 6 refills | Status: DC
  Filled 2023-06-06: qty 15, 30d supply, fill #0
  Filled 2023-06-29 – 2023-07-01 (×2): qty 15, 30d supply, fill #1
  Filled 2023-08-02 (×2): qty 15, 30d supply, fill #2
  Filled 2023-09-02: qty 15, 30d supply, fill #3
  Filled 2023-10-01: qty 15, 30d supply, fill #4
  Filled 2023-11-01 (×2): qty 15, 30d supply, fill #5

## 2023-06-06 NOTE — Progress Notes (Signed)
 EPIC Encounter for ICM Monitoring  Patient Name: Philip Chavez is a 73 y.o. male Date: 06/06/2023 Primary Care Physican: Veronia Goon, DO Primary Cardiologist: Mitzie Anda Electrophysiologist: Carolynne Citron BiV Pacing:  97.7%        05/08/2022 Weight: 195 lbs 06/12/2022 Weight: 195 lbs 10/31/2022 Weight: 196 lbs 02/19/2023 Weight: 195-198 03/26/2023 Weight: 195-197 lbs 04/30/2023 Weight: 195-198 lbs 06/02/2023 Weight: 187 lbs   Since 23-May-2023 Time in AT/AF 0.0 hours/day (0.0 %)          Spoke with patient and heart failure questions reviewed.  Transmission results reviewed.  Pt asymptomatic for fluid accumulation.  Reports feeling well at this time and voices no complaints.      OptiVol Thoracic impedance suggesting trending close to normal baseline.    Prescribed:  Torsemide  20 mg 1 tablet by mouth once a day.   Takes PRN due to causing urinary incontinence.  Potassium 10 mEq take 1 tablet by mouth twice a day Spironolactone  25 mg take 1 tablet by mouth once a day   Labs: 04/18/2023 Creatinine 1.58, BUN 18, Potassium 4.3, Sodium 139, GFR 46 01/08/2023 Creatinine 1.56, BUN 18, Potassium 4.0, Sodium 138, GFR 47 A complete set of results can be found in Results Review.   Recommendations:  No changes and encouraged to call if experiencing any fluid symptoms.   Follow-up plan: ICM clinic phone appointment on 07/07/2023.   91 day device clinic remote transmission 08/22/2023.     EP/Cardiology Office Visits:  Next yearly visit due 11/2023 with Dr Carolynne Citron (no recall for 2025).  08/11/2023 with HF Clinic.      Copy of ICM check sent to Dr. Carolynne Citron.    3 month ICM trend: 06/02/2023.    12-14 Month ICM trend:     Almyra Jain, RN 06/06/2023 1:44 PM

## 2023-06-10 ENCOUNTER — Other Ambulatory Visit (HOSPITAL_COMMUNITY): Payer: Self-pay

## 2023-06-29 ENCOUNTER — Other Ambulatory Visit: Payer: Self-pay

## 2023-07-01 ENCOUNTER — Other Ambulatory Visit (HOSPITAL_COMMUNITY): Payer: Self-pay

## 2023-07-02 NOTE — Progress Notes (Signed)
 Remote ICD transmission.

## 2023-07-02 NOTE — Addendum Note (Signed)
 Addended by: Lott Rouleau A on: 07/02/2023 08:22 AM   Modules accepted: Orders

## 2023-07-07 ENCOUNTER — Ambulatory Visit: Attending: Internal Medicine

## 2023-07-07 DIAGNOSIS — Z95810 Presence of automatic (implantable) cardiac defibrillator: Secondary | ICD-10-CM | POA: Diagnosis not present

## 2023-07-07 DIAGNOSIS — I5022 Chronic systolic (congestive) heart failure: Secondary | ICD-10-CM | POA: Diagnosis not present

## 2023-07-08 ENCOUNTER — Other Ambulatory Visit (HOSPITAL_COMMUNITY): Payer: Self-pay

## 2023-07-09 NOTE — Progress Notes (Signed)
 EPIC Encounter for ICM Monitoring  Patient Name: Philip Chavez is a 73 y.o. male Date: 07/09/2023 Primary Care Physican: Veronia Goon, DO Primary Cardiologist: Mitzie Anda Electrophysiologist: Carolynne Citron BiV Pacing:  97.1%        05/08/2022 Weight: 195 lbs 06/12/2022 Weight: 195 lbs 10/31/2022 Weight: 196 lbs 02/19/2023 Weight: 195-198 03/26/2023 Weight: 195-197 lbs 04/30/2023 Weight: 195-198 lbs 06/02/2023 Weight: 187 lbs   Since 02-Jun-2023 Time in AT/AF 0.4 hours/day (1.5 %) Observations (1)   - AT/AF>=6 h for 1 d.         Spoke with patient and heart failure questions reviewed.  Transmission results reviewed.  Pt asymptomatic for fluid accumulation.  Reports feeling well at this time and voices no complaints.      OptiVol Thoracic impedance suggesting normal fluid levels within the last month.    Prescribed:  Torsemide  20 mg 1 tablet by mouth once a day.   Takes PRN due to causing urinary incontinence.  Potassium 10 mEq take 1 tablet by mouth twice a day Spironolactone  25 mg take 1 tablet by mouth once a day   Labs: 04/18/2023 Creatinine 1.58, BUN 18, Potassium 4.3, Sodium 139, GFR 46 01/08/2023 Creatinine 1.56, BUN 18, Potassium 4.0, Sodium 138, GFR 47 A complete set of results can be found in Results Review.   Recommendations:  No changes and encouraged to call if experiencing any fluid symptoms.   Follow-up plan: ICM clinic phone appointment on 09/01/2023.   91 day device clinic remote transmission 08/22/2023.     EP/Cardiology Office Visits:  Recall 11/12/2023 with Dr Carolynne Citron.    08/11/2023 with HF Clinic.      Copy of ICM check sent to Dr. Carolynne Citron.    3 month ICM trend: 07/07/2023.    12-14 Month ICM trend:     Almyra Jain, RN 07/09/2023 3:36 PM

## 2023-08-02 ENCOUNTER — Other Ambulatory Visit (HOSPITAL_COMMUNITY): Payer: Self-pay

## 2023-08-04 DIAGNOSIS — H5203 Hypermetropia, bilateral: Secondary | ICD-10-CM | POA: Diagnosis not present

## 2023-08-04 DIAGNOSIS — H52223 Regular astigmatism, bilateral: Secondary | ICD-10-CM | POA: Diagnosis not present

## 2023-08-04 DIAGNOSIS — H524 Presbyopia: Secondary | ICD-10-CM | POA: Diagnosis not present

## 2023-08-04 DIAGNOSIS — H40013 Open angle with borderline findings, low risk, bilateral: Secondary | ICD-10-CM | POA: Diagnosis not present

## 2023-08-07 ENCOUNTER — Other Ambulatory Visit (HOSPITAL_COMMUNITY): Payer: Self-pay

## 2023-08-07 ENCOUNTER — Telehealth (HOSPITAL_COMMUNITY): Payer: Self-pay

## 2023-08-07 ENCOUNTER — Other Ambulatory Visit (HOSPITAL_COMMUNITY): Payer: Self-pay | Admitting: Cardiology

## 2023-08-07 MED ORDER — DAPAGLIFLOZIN PROPANEDIOL 10 MG PO TABS
10.0000 mg | ORAL_TABLET | Freq: Every day | ORAL | 11 refills | Status: AC
  Filled 2023-08-07: qty 30, 30d supply, fill #0
  Filled 2023-09-07: qty 30, 30d supply, fill #1
  Filled 2023-10-07: qty 30, 30d supply, fill #2
  Filled 2023-11-09: qty 30, 30d supply, fill #3
  Filled 2023-12-09: qty 30, 30d supply, fill #4
  Filled 2024-01-08: qty 30, 30d supply, fill #5
  Filled 2024-02-09: qty 30, 30d supply, fill #6
  Filled 2024-03-09 (×2): qty 30, 30d supply, fill #7

## 2023-08-07 NOTE — Telephone Encounter (Signed)
 Called to confirm/remind patient of their appointment at [facility] on 08/11/23.   Appointment:   [] Confirmed  [x] Left mess   [] No answer/No voice mail  [] VM Full/unable to leave message  [] Phone not in service  And to bring in all medications and/or complete list.

## 2023-08-11 ENCOUNTER — Encounter (HOSPITAL_COMMUNITY): Payer: Self-pay

## 2023-08-11 ENCOUNTER — Ambulatory Visit (HOSPITAL_COMMUNITY)
Admission: RE | Admit: 2023-08-11 | Discharge: 2023-08-11 | Disposition: A | Discharge status: Home / Self Care | Source: Ambulatory Visit | Attending: Family Medicine | Admitting: Family Medicine

## 2023-08-11 VITALS — BP 108/60 | HR 65 | Wt 193.8 lb

## 2023-08-11 DIAGNOSIS — G4733 Obstructive sleep apnea (adult) (pediatric): Secondary | ICD-10-CM | POA: Insufficient documentation

## 2023-08-11 DIAGNOSIS — I472 Ventricular tachycardia, unspecified: Secondary | ICD-10-CM | POA: Diagnosis not present

## 2023-08-11 DIAGNOSIS — Z79899 Other long term (current) drug therapy: Secondary | ICD-10-CM | POA: Insufficient documentation

## 2023-08-11 DIAGNOSIS — I480 Paroxysmal atrial fibrillation: Secondary | ICD-10-CM | POA: Insufficient documentation

## 2023-08-11 DIAGNOSIS — I428 Other cardiomyopathies: Secondary | ICD-10-CM | POA: Diagnosis not present

## 2023-08-11 DIAGNOSIS — N1831 Chronic kidney disease, stage 3a: Secondary | ICD-10-CM | POA: Insufficient documentation

## 2023-08-11 DIAGNOSIS — I2510 Atherosclerotic heart disease of native coronary artery without angina pectoris: Secondary | ICD-10-CM | POA: Diagnosis not present

## 2023-08-11 DIAGNOSIS — I442 Atrioventricular block, complete: Secondary | ICD-10-CM | POA: Insufficient documentation

## 2023-08-11 DIAGNOSIS — E785 Hyperlipidemia, unspecified: Secondary | ICD-10-CM | POA: Insufficient documentation

## 2023-08-11 DIAGNOSIS — Z4502 Encounter for adjustment and management of automatic implantable cardiac defibrillator: Secondary | ICD-10-CM | POA: Diagnosis not present

## 2023-08-11 DIAGNOSIS — I5022 Chronic systolic (congestive) heart failure: Secondary | ICD-10-CM | POA: Diagnosis not present

## 2023-08-11 DIAGNOSIS — Z7901 Long term (current) use of anticoagulants: Secondary | ICD-10-CM | POA: Diagnosis not present

## 2023-08-11 DIAGNOSIS — Z7984 Long term (current) use of oral hypoglycemic drugs: Secondary | ICD-10-CM | POA: Diagnosis not present

## 2023-08-11 DIAGNOSIS — Z86718 Personal history of other venous thrombosis and embolism: Secondary | ICD-10-CM | POA: Insufficient documentation

## 2023-08-11 DIAGNOSIS — I4892 Unspecified atrial flutter: Secondary | ICD-10-CM | POA: Insufficient documentation

## 2023-08-11 DIAGNOSIS — Z86711 Personal history of pulmonary embolism: Secondary | ICD-10-CM | POA: Diagnosis not present

## 2023-08-11 DIAGNOSIS — Z95810 Presence of automatic (implantable) cardiac defibrillator: Secondary | ICD-10-CM | POA: Insufficient documentation

## 2023-08-11 LAB — CBC
HCT: 44.1 % (ref 39.0–52.0)
Hemoglobin: 14.5 g/dL (ref 13.0–17.0)
MCH: 32.9 pg (ref 26.0–34.0)
MCHC: 32.9 g/dL (ref 30.0–36.0)
MCV: 100 fL (ref 80.0–100.0)
Platelets: 149 K/uL — ABNORMAL LOW (ref 150–400)
RBC: 4.41 MIL/uL (ref 4.22–5.81)
RDW: 13.8 % (ref 11.5–15.5)
WBC: 4.7 K/uL (ref 4.0–10.5)
nRBC: 0 % (ref 0.0–0.2)

## 2023-08-11 LAB — BASIC METABOLIC PANEL WITH GFR
Anion gap: 8 (ref 5–15)
BUN: 21 mg/dL (ref 8–23)
CO2: 25 mmol/L (ref 22–32)
Calcium: 8.9 mg/dL (ref 8.9–10.3)
Chloride: 104 mmol/L (ref 98–111)
Creatinine, Ser: 1.43 mg/dL — ABNORMAL HIGH (ref 0.61–1.24)
GFR, Estimated: 52 mL/min — ABNORMAL LOW (ref >60)
Glucose, Bld: 122 mg/dL — ABNORMAL HIGH (ref 70–99)
Potassium: 4 mmol/L (ref 3.5–5.1)
Sodium: 137 mmol/L (ref 135–145)

## 2023-08-11 LAB — DIGOXIN LEVEL: Digoxin Level: <0.4 ng/mL — ABNORMAL LOW (ref 0.8–2.0)
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
## 2023-08-11 NOTE — Patient Instructions (Signed)
 No medication changes today. Labs today - will call you if abnormal. Return to see Dr. Rolan in 3 months with echo. PLEASE CALL [phone] IN SEPTEMBER TO SCHEDULE THIS APPOINTMENT. Please call us  at [phone] if any questions or concerns prior to your next appointment.

## 2023-08-12 ENCOUNTER — Ambulatory Visit (HOSPITAL_COMMUNITY): Payer: Self-pay | Admitting: Family Medicine

## 2023-08-20 ENCOUNTER — Other Ambulatory Visit (HOSPITAL_COMMUNITY): Payer: Self-pay

## 2023-08-20 ENCOUNTER — Other Ambulatory Visit (HOSPITAL_COMMUNITY): Payer: Self-pay | Admitting: Cardiology

## 2023-08-20 MED ORDER — APIXABAN 5 MG PO TABS
5.0000 mg | ORAL_TABLET | Freq: Two times a day (BID) | ORAL | 3 refills | Status: AC
  Filled 2023-08-20: qty 180, 90d supply, fill #0
  Filled 2023-11-19: qty 180, 90d supply, fill #1
  Filled 2024-02-21: qty 180, 90d supply, fill #2

## 2023-08-21 ENCOUNTER — Other Ambulatory Visit (HOSPITAL_COMMUNITY): Payer: Self-pay

## 2023-08-22 ENCOUNTER — Ambulatory Visit: Payer: Medicare PPO

## 2023-08-22 DIAGNOSIS — I428 Other cardiomyopathies: Secondary | ICD-10-CM | POA: Diagnosis not present

## 2023-08-23 ENCOUNTER — Other Ambulatory Visit (HOSPITAL_COMMUNITY): Payer: Self-pay

## 2023-08-25 ENCOUNTER — Ambulatory Visit: Payer: Medicare PPO

## 2023-08-25 ENCOUNTER — Other Ambulatory Visit (HOSPITAL_COMMUNITY): Payer: Self-pay

## 2023-08-25 DIAGNOSIS — Z0000 Encounter for general adult medical examination without abnormal findings: Secondary | ICD-10-CM

## 2023-08-25 NOTE — Progress Notes (Signed)
 Subjective:   Kane Kusek is a 73 y.o. who presents for a Medicare Wellness preventive visit.  As a reminder, Annual Wellness Visits don't include a physical exam, and some assessments may be limited, especially if this visit is performed virtually. We may recommend an in-person follow-up visit with your provider if needed.  Visit Complete: Virtual I connected with  Conan Hales on 08/25/23 by a audio enabled telemedicine application and verified that I am speaking with the correct person using two identifiers.  Patient Location: Home  Provider Location: Office/Clinic  I discussed the limitations of evaluation and management by telemedicine. The patient expressed understanding and agreed to proceed.  Vital Signs: Because this visit was a virtual/telehealth visit, some criteria may be missing or patient reported. Any vitals not documented were not able to be obtained and vitals that have been documented are patient reported.  VideoError- Librarian, academic were attempted between this provider and patient, however failed, due to patient having technical difficulties OR patient did not have access to video capability.  We continued and completed visit with audio only.   Persons Participating in Visit: Patient.  AWV Questionnaire: No: Patient Medicare AWV questionnaire was not completed prior to this visit.  Cardiac Risk Factors include: advanced age (>10men, >51 women);dyslipidemia;male gender     Objective:    Today's Vitals   There is no height or weight on file to calculate BMI.     08/25/2023    1:40 PM 04/28/2023    7:01 AM 12/13/2022   12:10 PM 08/16/2022    7:23 PM 05/31/2022    4:01 PM 05/23/2022    9:01 AM 05/22/2022    8:17 PM  Advanced Directives  Does Patient Have a Medical Advance Directive? Yes Yes No No No No No  Type of Advance Directive Living will Healthcare Power of [place];Living will       Copy of Healthcare Power of Attorney in  Chart?  No - copy requested       Would patient like information on creating a medical advance directive?    Yes (MAU/Ambulatory/Procedural Areas - Information given) No - Patient declined      Current Medications (verified) Outpatient Encounter Medications as of 08/25/2023  Medication Sig   acetaminophen  (TYLENOL ) 325 MG tablet Take 650 mg by mouth every 6 (six) hours as needed for moderate pain.   amiodarone  (PACERONE ) 200 MG tablet Take 1 tablet (200 mg) by mouth daily. Do not take on Sundays.   apixaban  (ELIQUIS ) 5 MG TABS tablet Take 1 tablet (5 mg) by mouth 2 times daily.   atorvastatin  (LIPITOR ) 20 MG tablet Take 1 tablet (20 mg total) by mouth daily.   bisoprolol  (ZEBETA ) 5 MG tablet Take 1 tablet (5 mg total) by mouth daily.   dapagliflozin  propanediol (FARXIGA ) 10 MG TABS tablet Take 1 tablet (10 mg total) by mouth daily.   digoxin  (LANOXIN ) 0.125 MG tablet Take 1/2 tablet (0.0625 mg total) by mouth daily.   ferrous sulfate  324 (65 Fe) MG TBEC Take 1 tablet (325 mg total) by mouth daily.   ibuprofen  (ADVIL ) 600 MG tablet Take 1 tablet (600 mg total) by mouth every 8 (eight) hours as needed.   Multiple Vitamins-Minerals (CERTAVITE/ANTIOXIDANTS) TABS Take 1 tablet by mouth daily with breakfast.   oxymetazoline  (AFRIN) 0.05 % nasal spray Place 1 spray into both nostrils 2 (two) times daily as needed for congestion.   potassium chloride  (KLOR-CON ) 10 MEQ tablet TAKE 1 TABLET BY MOUTH  TWICE A DAY   sacubitril -valsartan  (ENTRESTO ) 49-51 MG Take 1 tablet by mouth 2 (two) times daily.   spironolactone  (ALDACTONE ) 25 MG tablet Take 1 tablet (25 mg total) by mouth daily.   torsemide  (DEMADEX ) 20 MG tablet Take 20 mg by mouth as needed.   [DISCONTINUED] isosorbide  dinitrate (ISORDIL ) 10 MG tablet Take 1 tablet (10 mg total) by mouth 3 (three) times daily.   No facility-administered encounter medications on file as of 08/25/2023.    Allergies (verified) Patient has no known allergies.    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 08/2014 presented w/ aflutter->converted on amio;  b. CHA2DS2VASc = 2 -->Xarelto .   Atrial tachycardia (HCC)    Chronic combined systolic and diastolic CHF (congestive heart failure) (HCC)    a. 03/2013 Echo EF 50-55%;  b. 07/2013 Echo EF 20-25%, diff HK, Gr3 DD;  c. 08/2014 Echo: EF 15%, diff HK, mild AI/MR, mildly dil LA/RV, mod TR, PASP .   DVT (deep venous thrombosis) (HCC)    a. 2007 RLE: S/P ankle surgery;  b. 03/2013 LLE DVT & PE-->Xarelto .   Embolism, pulmonary with infarction [facility])    a. 2007 RLE: S/P ankle surgery;  b. 03/2013 LLE DVT & PE-->Xarelto .   Epididymoorchitis 06/03/2022   Flank pain 05/03/2021   Glucosuria 05/03/2021   Hypercholesterolemia    Hypercoagulable state (HCC)    Hypertension    Musculoskeletal neck pain    Nonischemic cardiomyopathy (HCC)    a. 07/2013 Echo: EF 20-25%;  b. 07/2013 Myoview: Large inferior, lateral, apical scar w/ HK, no ischemia, EF 17%;  b. 08/2014 Echo: EF 15%, diff HK.   Pneumonia ~ 04/2014; 09/02/2014   Presence of permanent cardiac pacemaker    a. 03/2013 s/p MDT ADDRL1 Adapta DC PPM, ser # [id number].   Sleep apnea    does not wear mask (09/02/2014)   Third degree heart block (HCC)    a. 03/2013 s/p MDT ADDRL1 Adapta DC PPM, ser # [id number].   Urinary tract infection with hematuria 06/03/2022   Past Surgical History:  Procedure Laterality Date   BIV ICD GENERATOR CHANGEOUT N/A 02/11/2022   Procedure: BIV ICD GENERATOR CHANGEOUT;  Surgeon: Waddell Danelle ORN, MD;  Location: [facility] INVASIVE CV LAB;  Service: Cardiovascular;  Laterality: N/A;   CARDIAC CATHETERIZATION N/A 10/17/2014   Procedure: Right/Left Heart Cath and Coronary Angiography;  Surgeon: Ezra GORMAN Shuck, MD;  Location: [facility] INVASIVE CV LAB;  Service: Cardiovascular;  Laterality: N/A;   CARDIOVERSION  N/A 07/26/2020   Procedure: CARDIOVERSION;  Surgeon: Lonni Slain, MD;  Location: [facility] ENDOSCOPY;  Service: Cardiovascular;  Laterality: N/A;   COLONOSCOPY WITH PROPOFOL  N/A 11/28/2021   Procedure: COLONOSCOPY WITH PROPOFOL ;  Surgeon: Dianna Specking, MD;  Location: [facility] ENDOSCOPY;  Service: Gastroenterology;  Laterality: N/A;   COLONOSCOPY WITH PROPOFOL  N/A 04/28/2023   Procedure: COLONOSCOPY WITH PROPOFOL ;  Surgeon: Dianna Specking, MD;  Location: [facility] ENDOSCOPY;  Service: Gastroenterology;  Laterality: N/A;   ELECTROPHYSIOLOGIC STUDY N/A 09/07/2014   Procedure: A-Flutter;  Surgeon: Danelle ORN Waddell, MD;  Location: [facility] INVASIVE CV LAB;  Service: Cardiovascular;  Laterality: N/A;   EP IMPLANTABLE DEVICE  10/24/2014   BIV   EP IMPLANTABLE DEVICE N/A 10/24/2014   Procedure: BiV ICD Upgrade;  Surgeon: Danelle ORN Waddell, MD;  Location: [facility] INVASIVE CV LAB;  Service: Cardiovascular;  Laterality: N/A;  FOOT FRACTURE SURGERY Right 2007   FRACTURE SURGERY     INGUINAL HERNIA REPAIR Right 1980's   INGUINAL HERNIA REPAIR Right 09/22/2020   Procedure: LAPAROSCOPIC RIGHT INGUINAL HERNIA REPAIR WITH MESH;  Surgeon: Rubin Calamity, MD;  Location: [facility] OR;  Service: General;  Laterality: Right;   INSERT / REPLACE / REMOVE PACEMAKER     MDT ADDRL1 pacemaker implanted by Dr Waddell for complete heart block   LEAD EXTRACTION N/A 02/11/2022   Procedure: LEAD EXTRACTION;  Surgeon: Waddell Danelle ORN, MD;  Location: [facility] INVASIVE CV LAB;  Service: Cardiovascular;  Laterality: N/A;   PERMANENT PACEMAKER INSERTION N/A 03/15/2013   Procedure: PERMANENT PACEMAKER INSERTION;  Surgeon: Danelle ORN Waddell, MD;  Location: [facility] CATH LAB;  Service: Cardiovascular;  Laterality: N/A;   POLYPECTOMY  11/28/2021   Procedure: POLYPECTOMY;  Surgeon: Dianna Specking, MD;  Location: [facility] ENDOSCOPY;  Service: Gastroenterology;;   POLYPECTOMY  04/28/2023   Procedure: POLYPECTOMY;  Surgeon: Dianna Specking, MD;  Location: [facility] ENDOSCOPY;  Service:  Gastroenterology;;   RIGHT HEART CATH N/A 12/05/2021   Procedure: RIGHT HEART CATH;  Surgeon: Rolan Ezra RAMAN, MD;  Location: [facility] INVASIVE CV LAB;  Service: Cardiovascular;  Laterality: N/A;   SCLEROTHERAPY  11/28/2021   Procedure: SCLEROTHERAPY;  Surgeon: Dianna Specking, MD;  Location: [facility] ENDOSCOPY;  Service: Gastroenterology;;   SUBMUCOSAL TATTOO INJECTION  11/28/2021   Procedure: SUBMUCOSAL TATTOO INJECTION;  Surgeon: Dianna Specking, MD;  Location: [facility] ENDOSCOPY;  Service: Gastroenterology;;   Family History  Problem Relation Age of Onset   Diabetes type II Mother    Heart disease Father    Arrhythmia Father    Arrhythmia Sister    Hypertension Neg Hx    Heart attack Neg Hx    Stroke Neg Hx    Tremor Neg Hx    Parkinson's disease Neg Hx    Social History   Socioeconomic History   Marital status: Married    Spouse name: Gustav   Number of children: 2   Years of education: Not on file   Highest education level: Not on file  Occupational History   Not on file  Tobacco Use   Smoking status: Former    Current packs/day: 0.00    Average packs/day: 1.5 packs/day for 10.0 years (15.0 ttl pk-yrs)    Types: Cigarettes    Start date: 02/05/1972    Quit date: 02/04/1982    Years since quitting: 41.5    Passive exposure: Past   Smokeless tobacco: Never   Tobacco comments:    09/02/2014  quit smoking 20-30 yr ago  Vaping Use   Vaping status: Never Used  Substance and Sexual Activity   Alcohol use: No   Drug use: No   Sexual activity: Yes  Other Topics Concern   Not on file  Social History Narrative   Caffeine: none   Right handed   Social Drivers of Health   Financial Resource Strain: Low Risk  (08/25/2023)   Overall Financial Resource Strain (CARDIA)    Difficulty of Paying Living Expenses: Not hard at all  Food Insecurity: No Food Insecurity (08/25/2023)   Hunger Vital Sign    Worried About Running Out of Food in the Last Year: Never true    Ran Out of Food in the  Last Year: Never true  Transportation Needs: No Transportation Needs (08/25/2023)   PRAPARE - Administrator, Civil Service (Medical): No    Lack of Transportation (Non-Medical): No  Physical Activity: Inactive (08/25/2023)  Exercise Vital Sign    Days of Exercise per Week: 0 days    Minutes of Exercise per Session: 0 min  Stress: No Stress Concern Present (08/25/2023)   [employer] of Occupational Health - Occupational Stress Questionnaire    Feeling of Stress: Not at all  Social Connections: Moderately Isolated (08/25/2023)   Social Connection and Isolation Panel    Frequency of Communication with Friends and Family: More than three times a week    Frequency of Social Gatherings with Friends and Family: Once a week    Attends Religious Services: Never    Database administrator or Organizations: No    Attends Banker Meetings: Never    Marital Status: Married    Tobacco Counseling Counseling given: Not Answered Tobacco comments: 09/02/2014  quit smoking 20-30 yr ago    Clinical Intake:  Pre-visit preparation completed: Yes  Pain : No/denies pain     Nutritional Risks: None Diabetes: No  Lab Results  Component Value Date   HGBA1C 5.5 05/19/2023   HGBA1C 6.1 (H) 04/12/2022   HGBA1C 5.6 05/03/2021     How often do you need to have someone help you when you read instructions, pamphlets, or other written materials from your doctor or pharmacy?: 1 - Never  Interpreter Needed?: No  Information entered by :: NAllen LPN   Activities of Daily Living     08/25/2023    1:36 PM  In your present state of health, do you have any difficulty performing the following activities:  Hearing? 0  Vision? 0  Difficulty concentrating or making decisions? 0  Walking or climbing stairs? 0  Dressing or bathing? 0  Doing errands, shopping? 0  Preparing Food and eating ? N  Using the Toilet? N  In the past six months, have you accidently leaked urine? N   Do you have problems with loss of bowel control? N  Managing your Medications? N  Managing your Finances? N  Housekeeping or managing your Housekeeping? N    Patient Care Team: Janna Ferrier, DO as PCP - General (Family Medicine) Rolan Ezra RAMAN, MD as PCP - Advanced Heart Failure (Cardiology) Waddell Danelle ORN, MD as PCP - Electrophysiology (Cardiology)  I have updated your Care Teams any recent Medical Services you may have received from other providers in the past year.     Assessment:   This is a routine wellness examination for Josef.  Hearing/Vision screen Hearing Screening - Comments:: Denies hearing issues Vision Screening - Comments:: Regular eye exams, [facility]   Goals Addressed             This Visit's Progress    Patient Stated       08/25/2023, be more active       Depression Screen     08/25/2023    1:42 PM 05/19/2023   11:12 AM 03/18/2023   11:12 AM 12/13/2022   12:10 PM 08/16/2022    7:23 PM 07/12/2022    2:57 PM 05/31/2022    4:01 PM  PHQ 2/9 Scores  PHQ - 2 Score 0 0 0 0 0 0 0  PHQ- 9 Score 0 0 0 1 0 0 0    Fall Risk     08/25/2023    1:41 PM 05/19/2023   11:11 AM 08/16/2022    7:24 PM 07/12/2022    2:57 PM 05/31/2022    4:01 PM  Fall Risk   Falls in the past year? 0 0  0 0 0  Number falls in past yr: 0 0 0 0 0  Injury with Fall? 0 0 0  0  Risk for fall due to : Medication side effect No Fall Risks Impaired balance/gait    Follow up Falls evaluation completed;Falls prevention discussed Falls evaluation completed Falls prevention discussed;Education provided;Falls evaluation completed      MEDICARE RISK AT HOME:  Medicare Risk at Home Any stairs in or around the home?: Yes If so, are there any without handrails?: No Home free of loose throw rugs in walkways, pet beds, electrical cords, etc?: Yes Adequate lighting in your home to reduce risk of falls?: Yes Life alert?: No Use of a cane, walker or w/c?: No Grab bars in the  bathroom?: Yes Shower chair or bench in shower?: No Elevated toilet seat or a handicapped toilet?: No  TIMED UP AND GO:  Was the test performed?  No  Cognitive Function: 6CIT completed    11/21/2021    9:44 AM 11/21/2021    9:27 AM  MMSE - Mini Mental State Exam  Orientation to time 5 5  Orientation to Place 5 5  Attention/ Calculation 5   Recall 3   Language- repeat 1 1        08/25/2023    1:43 PM 08/16/2022    7:24 PM  6CIT Screen  What Year? 0 points 0 points  What month? 0 points 0 points  What time? 0 points 0 points  Count back from 20 0 points 0 points  Months in reverse 2 points 2 points  Repeat phrase 2 points 0 points  Total Score 4 points 2 points    Immunizations Immunization History  Administered Date(s) Administered   Fluad Quad(high Dose 65+) 11/26/2018, 04/29/2022   Influenza, High Dose Seasonal PF 12/17/2016, 10/28/2017, 01/05/2021   Influenza,inj,Quad PF,6+ Mos 10/25/2014   Influenza-Unspecified 10/06/2022   PFIZER(Purple Top)SARS-COV-2 Vaccination 04/02/2019, 04/28/2019, 11/15/2019   Pfizer Covid-19 Vaccine Bivalent Booster 30yrs & up 05/18/2021   Pfizer(Comirnaty)Fall Seasonal Vaccine 12 years and older 04/29/2022, 10/06/2022   Pneumococcal Conjugate-13 11/26/2018   Pneumococcal Polysaccharide-23 07/15/2013   Tdap 04/13/2013    Screening Tests Health Maintenance  Topic Date Due   Zoster Vaccines- Shingrix (1 of 2) Never done   Pneumococcal Vaccine: 50+ Years (3 of 3 - PCV20 or PCV21) 01/21/2019   COVID-19 Vaccine (7 - Pfizer risk 2024-25 season) 04/05/2023   DTaP/Tdap/Td (2 - Td or Tdap) 04/14/2023   Diabetic kidney evaluation - Urine ACR  02/05/2028 (Originally 11/26/1968)   FOOT EXAM  02/05/2028 (Originally 11/26/1960)   OPHTHALMOLOGY EXAM  02/05/2028 (Originally 11/26/1960)   INFLUENZA VACCINE  09/05/2023   HEMOGLOBIN A1C  11/18/2023   Diabetic kidney evaluation - eGFR measurement  08/10/2024   Medicare Annual Wellness (AWV)   08/24/2024   Colonoscopy  04/27/2033   Hepatitis C Screening  Completed   Hepatitis B Vaccines  Aged Out   HPV VACCINES  Aged Out   Meningococcal B Vaccine  Aged Out    Health Maintenance  Health Maintenance Due  Topic Date Due   Zoster Vaccines- Shingrix (1 of 2) Never done   Pneumococcal Vaccine: 50+ Years (3 of 3 - PCV20 or PCV21) 01/21/2019   COVID-19 Vaccine (7 - Pfizer risk 2024-25 season) 04/05/2023   DTaP/Tdap/Td (2 - Td or Tdap) 04/14/2023   Health Maintenance Items Addressed: Due for pneumonia, TDAP and shingles vaccine.  Additional Screening:  Vision Screening: Recommended annual ophthalmology exams for early detection of glaucoma  and other disorders of the eye. Would you like a referral to an eye doctor? No    Dental Screening: Recommended annual dental exams for proper oral hygiene  Community Resource Referral / Chronic Care Management: CRR required this visit?  No   CCM required this visit?  No   Plan:    I have personally reviewed and noted the following in the patient's chart:   Medical and social history Use of alcohol, tobacco or illicit drugs  Current medications and supplements including opioid prescriptions. Patient is not currently taking opioid prescriptions. Functional ability and status Nutritional status Physical activity Advanced directives List of other physicians Hospitalizations, surgeries, and ER visits in previous 12 months Vitals Screenings to include cognitive, depression, and falls Referrals and appointments  In addition, I have reviewed and discussed with patient certain preventive protocols, quality metrics, and best practice recommendations. A written personalized care plan for preventive services as well as general preventive health recommendations were provided to patient.   Ardella FORBES Dawn, LPN   [id number]   After Visit Summary: (Pick Up) Due to this being a telephonic visit, with patients personalized plan was offered  to patient and patient has requested to Pick up at office.  Notes: Nothing significant to report at this time.

## 2023-08-25 NOTE — Patient Instructions (Signed)
 [name] , Thank you for taking time out of your busy schedule to complete your Annual Wellness Visit with me. I enjoyed our conversation and look forward to speaking with you again next year. I, as well as your care team,  appreciate your ongoing commitment to your health goals. Please review the following plan we discussed and let me know if I can assist you in the future. Your Game plan/ To Do List    Referrals: If you haven't heard from the office you've been referred to, please reach out to them at the phone provided.  N/a Follow up Visits: Next Medicare AWV with our clinical staff: 08/30/2024 at 1:40   Have you seen your provider in the last 6 months (3 months if uncontrolled diabetes)? Yes Next Office Visit with your provider: will schedule when needed  Clinician Recommendations:  Aim for 30 minutes of exercise or brisk walking, 6-8 glasses of water, and 5 servings of fruits and vegetables each day.       This is a list of the screening recommended for you and due dates:  Health Maintenance  Topic Date Due   Zoster (Shingles) Vaccine (1 of 2) Never done   Pneumococcal Vaccine for age over 39 (3 of 3 - PCV20 or PCV21) 01/21/2019   COVID-19 Vaccine (7 - Pfizer risk 2024-25 season) 04/05/2023   DTaP/Tdap/Td vaccine (2 - Td or Tdap) 04/14/2023   Yearly kidney health urinalysis for diabetes  02/05/2028*   Complete foot exam   02/05/2028*   Eye exam for diabetics  02/05/2028*   Flu Shot  09/05/2023   Hemoglobin A1C  11/18/2023   Yearly kidney function blood test for diabetes  08/10/2024   Medicare Annual Wellness Visit  08/24/2024   Colon Cancer Screening  04/27/2033   Hepatitis C Screening  Completed   Hepatitis B Vaccine  Aged Out   HPV Vaccine  Aged Out   Meningitis B Vaccine  Aged Out  *Topic was postponed. The date shown is not the original due date.    Advanced directives: (ACP Link)Information on Advanced Care Planning can be found at [place]  Secretary of [facility]  Advance Health Care Directives Advance Health Care Directives. [email or web address]  Advance Care Planning is important because it:  [x]  Makes sure you receive the medical care that is consistent with your values, goals, and preferences  [x]  It provides guidance to your family and loved ones and reduces their decisional burden about whether or not they are making the right decisions based on your wishes.  Follow the link provided in your after visit summary or read over the paperwork we have mailed to you to help you started getting your Advance Directives in place. If you need assistance in completing these, please reach out to us  so that we can help you!  See attachments for Preventive Care and Fall Prevention Tips.

## 2023-08-26 LAB — CUP PACEART REMOTE DEVICE CHECK
Battery Remaining Longevity: 109 mo
Battery Voltage: 2.99 V
Brady Statistic AP VP Percent: 6.51 %
Brady Statistic AP VS Percent: 0.01 %
Brady Statistic AS VP Percent: 88.35 %
Brady Statistic AS VS Percent: 5.12 %
Brady Statistic RA Percent Paced: 7.76 %
Brady Statistic RV Percent Paced: 0 %
Date Time Interrogation Session: 20250718061156
HighPow Impedance: 49 Ohm
Lead Channel Impedance Value: 266 Ohm
Lead Channel Impedance Value: 304 Ohm
Lead Channel Impedance Value: 323 Ohm
Lead Channel Impedance Value: 323 Ohm
Lead Channel Impedance Value: 361 Ohm
Lead Channel Impedance Value: 589 Ohm
Lead Channel Pacing Threshold Amplitude: 0.625 V
Lead Channel Pacing Threshold Pulse Width: 0.4 ms
Lead Channel Sensing Intrinsic Amplitude: 11.1 mV
Lead Channel Sensing Intrinsic Amplitude: 2.8 mV
Lead Channel Setting Pacing Amplitude: 2 V
Lead Channel Setting Pacing Amplitude: 2 V
Lead Channel Setting Pacing Pulse Width: 0.4 ms
Lead Channel Setting Sensing Sensitivity: 0.3 mV
Zone Setting Status: 755011
Zone Setting Status: 755011
Zone Setting Status: 755011

## 2023-08-27 ENCOUNTER — Ambulatory Visit: Payer: Self-pay | Admitting: Internal Medicine

## 2023-08-29 ENCOUNTER — Other Ambulatory Visit (HOSPITAL_COMMUNITY): Payer: Self-pay

## 2023-09-01 ENCOUNTER — Ambulatory Visit: Attending: Internal Medicine

## 2023-09-01 DIAGNOSIS — Z95810 Presence of automatic (implantable) cardiac defibrillator: Secondary | ICD-10-CM

## 2023-09-01 DIAGNOSIS — I5022 Chronic systolic (congestive) heart failure: Secondary | ICD-10-CM | POA: Diagnosis not present

## 2023-09-03 ENCOUNTER — Other Ambulatory Visit (HOSPITAL_COMMUNITY): Payer: Self-pay

## 2023-09-05 NOTE — Progress Notes (Signed)
 EPIC Encounter for ICM Monitoring  Patient Name: Philip Chavez is a 73 y.o. male Date: 09/05/2023 Primary Care Physican: Janna Ferrier, DO Primary Cardiologist: Rolan Electrophysiologist: Waddell BiV Pacing:  95.6%        05/08/2022 Weight: 195 lbs 06/12/2022 Weight: 195 lbs 10/31/2022 Weight: 196 lbs 02/19/2023 Weight: 195-198 03/26/2023 Weight: 195-197 lbs 04/30/2023 Weight: 195-198 lbs 06/02/2023 Weight: 187 lbs 08/11/2023 Office Weight: 193 lbs 09/05/2023 Weight: 187-189 lbs    Since 22-Aug-2023 Time in AT/AF   0.9 hours/day (3.6 %)          Spoke with patient and heart failure questions reviewed.  Transmission results reviewed.  Pt asymptomatic for fluid accumulation.  Reports feeling well at this time and voices no complaints.     OptiVol Thoracic impedance suggesting normal fluid levels within the last month.    Prescribed:  Torsemide  20 mg 1 tablet by mouth as needed.  Potassium 10 mEq take 1 tablet by mouth twice a day Spironolactone  25 mg take 1 tablet by mouth once a day   Labs: 08/11/2023 Creatinine 1.43, BUN 21, Potassium 4.0, Sodium 137 A complete set of results can be found in Results Review.   Recommendations:  No changes and encouraged to call if experiencing any fluid symptoms.   Follow-up plan: ICM clinic phone appointment on 10/07/2023.   91 day device clinic remote transmission 11/21/2023.     EP/Cardiology Office Visits:  Recall 11/12/2023 with Dr Waddell.    Recall 11/09/2023 with HF Clinic.      Copy of ICM check sent to Dr. Waddell.    3 month ICM trend: 09/01/2023.    12-14 Month ICM trend:     Mitzie GORMAN Garner, RN 09/05/2023 8:14 AM

## 2023-09-30 ENCOUNTER — Other Ambulatory Visit (HOSPITAL_COMMUNITY): Payer: Self-pay

## 2023-10-01 ENCOUNTER — Other Ambulatory Visit (HOSPITAL_COMMUNITY): Payer: Self-pay

## 2023-10-05 ENCOUNTER — Other Ambulatory Visit (HOSPITAL_COMMUNITY): Payer: Self-pay | Admitting: Family Medicine

## 2023-10-07 ENCOUNTER — Ambulatory Visit: Attending: Internal Medicine

## 2023-10-07 ENCOUNTER — Other Ambulatory Visit (HOSPITAL_COMMUNITY): Payer: Self-pay

## 2023-10-07 DIAGNOSIS — Z95810 Presence of automatic (implantable) cardiac defibrillator: Secondary | ICD-10-CM

## 2023-10-07 DIAGNOSIS — I5022 Chronic systolic (congestive) heart failure: Secondary | ICD-10-CM | POA: Diagnosis not present

## 2023-10-07 MED ORDER — BISOPROLOL FUMARATE 5 MG PO TABS
5.0000 mg | ORAL_TABLET | Freq: Every day | ORAL | 3 refills | Status: AC
  Filled 2023-10-07: qty 90, 90d supply, fill #0
  Filled 2024-01-04: qty 90, 90d supply, fill #1

## 2023-10-09 NOTE — Progress Notes (Signed)
 EPIC Encounter for ICM Monitoring  Patient Name: Philip Chavez is a 73 y.o. male Date: 10/09/2023 Primary Care Physican: Janna Ferrier, DO Primary Cardiologist: Rolan Electrophysiologist: Waddell BiV Pacing:  97.26%        05/08/2022 Weight: 195 lbs 06/12/2022 Weight: 195 lbs 10/31/2022 Weight: 196 lbs 02/19/2023 Weight: 195-198 03/26/2023 Weight: 195-197 lbs 04/30/2023 Weight: 195-198 lbs 06/02/2023 Weight: 187 lbs 08/11/2023 Office Weight: 193 lbs 09/05/2023 Weight: 187-189 lbs  10/09/2023 Weight: 189-191 lbs   Since 01-Sep-2023 Time in AT/AF   0.3 hours/day (1.3 %)          Spoke with patient and heart failure questions reviewed.  Transmission results reviewed.  Pt asymptomatic for fluid accumulation.  Reports feeling well at this time and voices no complaints.     OptiVol Thoracic impedance suggesting normal fluid levels within the last month.    Prescribed:  Torsemide  20 mg 1 tablet by mouth as needed.  Potassium 10 mEq take 1 tablet by mouth twice a day Spironolactone  25 mg take 1 tablet by mouth once a day   Labs: 08/11/2023 Creatinine 1.43, BUN 21, Potassium 4.0, Sodium 137 A complete set of results can be found in Results Review.   Recommendations:  No changes and encouraged to call if experiencing any fluid symptoms.   Follow-up plan: ICM clinic phone appointment on 11/24/2023.   91 day device clinic remote transmission 11/21/2023.     EP/Cardiology Office Visits:  Recall 11/12/2023 with Dr Waddell.    Recall 11/09/2023 with HF Clinic.      Copy of ICM check sent to Dr. Waddell.    3 month ICM trend: 10/06/2023.    12-14 Month ICM trend:     Mitzie GORMAN Garner, RN 10/09/2023 7:39 AM

## 2023-10-28 ENCOUNTER — Encounter: Payer: Self-pay | Admitting: Family Medicine

## 2023-10-28 ENCOUNTER — Ambulatory Visit: Admitting: Family Medicine

## 2023-10-28 VITALS — BP 92/70 | HR 64 | Ht 73.0 in | Wt 191.0 lb

## 2023-10-28 DIAGNOSIS — M79605 Pain in left leg: Secondary | ICD-10-CM

## 2023-10-28 DIAGNOSIS — Z23 Encounter for immunization: Secondary | ICD-10-CM | POA: Diagnosis not present

## 2023-10-28 NOTE — Patient Instructions (Addendum)
 It was great to see you today! Thank you for choosing [facility] for your primary care. Philip Chavez was seen for L sided leg pain.  Today we addressed: L sided leg pain - Take Tylenol  as needed to help with the pain and stretch regularly. You seem stiff on my exam today. I would advise more activity such as daily wakes and stretches to help. Vaccines - we can give you Flu and COVID shots here today. You will need to go to [facility] to get Tdap booster and Shingles  If you haven't already, sign up for My Chart to have easy access to your labs results, and communication with your primary care physician.  You should return to our clinic Return if symptoms worsen or fail to improve. Please arrive 15 minutes before your appointment to ensure smooth check in process.  We appreciate your efforts in making this happen.  Thank you for allowing me to participate in your care, Kathrine Melena, DO 10/28/2023, 2:47 PM PGY-2, [facility]

## 2023-10-28 NOTE — Progress Notes (Signed)
    SUBJECTIVE:   CHIEF COMPLAINT / HPI:   L leg pain - Started 2 weeks ago, intermittently flares - Unsure if theres was an inciting event - Not aggravated by walking or sitting prolonged periods of time - Resting and staying off his feet helps with the symptoms - Primarily feels it at the end of the day primarily over his IT band, knee, and shin. Denies any paresthesia.  - Denies low back pain - Denies bilateral radicular symptoms, urinary retention, and saddle anesthesia  Vaccines - Offered Flu and COVID vaccines today and patient agreed - [facility]eds to [facility] get Tdap booster and Shingles  PERTINENT  PMH / PSH: AICD, HLD, HTN, NICM, DVTs, A-fib on Eliquis   OBJECTIVE:   BP 92/70   Pulse 64   Ht 6' 1 (1.854 m)   Wt 191 lb (86.6 kg)   SpO2 100%   BMI 25.20 kg/m   General: Awake and Alert in NAD HEENT: NCAT. Sclera anicteric. No rhinorrhea. Cardiovascular: Irregularly irregular. Respiratory: CTAB, normal WOB on RA. No wheezing, crackles, rhonchi, or diminished breath sounds. Abdomen: Soft, non-tender, non-distended. Bowel sounds normoactive Extremities: Able to move all extremities. No BLE edema, no deformities or significant joint findings. No TTP over thoracic or lumbar spine or b/l SIJs. Stiffness with flexion, extension, and IR and ER of hips. No calf pain bilaterally. Skin: Warm and dry. No abrasions or rashes noted. Neuro: A&Ox3. No focal neurological deficits.  ASSESSMENT/PLAN:   Assessment & Plan Left leg pain Majority of his pain seems to be in the lateral left leg with some pain over the knee and shin as well.  Differential included IT band syndrome, sciatica, and meralgia paresthetica.  Patient was initially concerned about DVT with his prior history, but has been compliant with his Eliquis  BID so likelihood is low and doesn't match the description of his pain.  Symptoms most likely consistent with IT band syndrome, since pain is sporadic and there is stiffness  on exam, likely in the setting of decreased flexibility and lack of stretching.  No radiculopathy or paresthesia on exam. - Demonstrated stretches for patient to perform at home - Advised Tylenol  as needed for pain, discussed only using NSAIDs sparingly as needed in the setting of being on [facility] and with kidney function - If symptoms persist may pursue outpatient PT referral and further workup Encounter for immunization Flu and COVID vaccines administered today by Harlene Reiter. Advised [facility] and Tdap booster.   Kathrine Melena, DO [facility]

## 2023-10-30 ENCOUNTER — Other Ambulatory Visit (HOSPITAL_COMMUNITY): Payer: Self-pay

## 2023-10-30 MED ORDER — ZOSTER VAC RECOMB ADJUVANTED 50 MCG/0.5ML IM SUSR
INTRAMUSCULAR | 0 refills | Status: AC
  Filled 2023-10-30: qty 0.5, 1d supply, fill #0

## 2023-11-01 ENCOUNTER — Other Ambulatory Visit (HOSPITAL_COMMUNITY): Payer: Self-pay

## 2023-11-06 ENCOUNTER — Other Ambulatory Visit (HOSPITAL_COMMUNITY): Payer: Self-pay | Admitting: Cardiology

## 2023-11-06 ENCOUNTER — Other Ambulatory Visit (HOSPITAL_COMMUNITY): Payer: Self-pay

## 2023-11-06 MED ORDER — SPIRONOLACTONE 25 MG PO TABS
25.0000 mg | ORAL_TABLET | Freq: Every day | ORAL | 3 refills | Status: AC
  Filled 2023-11-06: qty 90, 90d supply, fill #0
  Filled 2023-11-08 – 2024-02-07 (×3): qty 90, 90d supply, fill #1

## 2023-11-07 ENCOUNTER — Other Ambulatory Visit (HOSPITAL_COMMUNITY): Payer: Self-pay

## 2023-11-08 ENCOUNTER — Other Ambulatory Visit (HOSPITAL_COMMUNITY): Payer: Self-pay

## 2023-11-10 NOTE — Progress Notes (Signed)
 Remote ICD Transmission

## 2023-11-14 ENCOUNTER — Ambulatory Visit (HOSPITAL_COMMUNITY)
Admission: RE | Admit: 2023-11-14 | Discharge: 2023-11-14 | Disposition: A | Discharge status: Home / Self Care | Source: Ambulatory Visit | Attending: Cardiology | Admitting: Cardiology

## 2023-11-14 ENCOUNTER — Other Ambulatory Visit: Payer: Self-pay

## 2023-11-14 ENCOUNTER — Encounter (HOSPITAL_COMMUNITY): Payer: Self-pay | Admitting: Cardiology

## 2023-11-14 ENCOUNTER — Other Ambulatory Visit (HOSPITAL_COMMUNITY): Payer: Self-pay

## 2023-11-14 VITALS — BP 100/60 | HR 57 | Wt 192.6 lb

## 2023-11-14 DIAGNOSIS — I5022 Chronic systolic (congestive) heart failure: Secondary | ICD-10-CM

## 2023-11-14 DIAGNOSIS — I480 Paroxysmal atrial fibrillation: Secondary | ICD-10-CM | POA: Diagnosis not present

## 2023-11-14 DIAGNOSIS — I4892 Unspecified atrial flutter: Secondary | ICD-10-CM | POA: Insufficient documentation

## 2023-11-14 DIAGNOSIS — E785 Hyperlipidemia, unspecified: Secondary | ICD-10-CM | POA: Insufficient documentation

## 2023-11-14 DIAGNOSIS — G4733 Obstructive sleep apnea (adult) (pediatric): Secondary | ICD-10-CM | POA: Insufficient documentation

## 2023-11-14 DIAGNOSIS — I77810 Thoracic aortic ectasia: Secondary | ICD-10-CM | POA: Insufficient documentation

## 2023-11-14 DIAGNOSIS — I080 Rheumatic disorders of both mitral and aortic valves: Secondary | ICD-10-CM | POA: Diagnosis not present

## 2023-11-14 DIAGNOSIS — R251 Tremor, unspecified: Secondary | ICD-10-CM | POA: Insufficient documentation

## 2023-11-14 DIAGNOSIS — Z79899 Other long term (current) drug therapy: Secondary | ICD-10-CM | POA: Insufficient documentation

## 2023-11-14 DIAGNOSIS — Z7901 Long term (current) use of anticoagulants: Secondary | ICD-10-CM | POA: Diagnosis not present

## 2023-11-14 DIAGNOSIS — Z8679 Personal history of other diseases of the circulatory system: Secondary | ICD-10-CM | POA: Diagnosis not present

## 2023-11-14 DIAGNOSIS — I110 Hypertensive heart disease with heart failure: Secondary | ICD-10-CM | POA: Diagnosis not present

## 2023-11-14 DIAGNOSIS — I429 Cardiomyopathy, unspecified: Secondary | ICD-10-CM | POA: Insufficient documentation

## 2023-11-14 DIAGNOSIS — I499 Cardiac arrhythmia, unspecified: Secondary | ICD-10-CM | POA: Diagnosis not present

## 2023-11-14 DIAGNOSIS — Z8249 Family history of ischemic heart disease and other diseases of the circulatory system: Secondary | ICD-10-CM | POA: Insufficient documentation

## 2023-11-14 DIAGNOSIS — N183 Chronic kidney disease, stage 3 unspecified: Secondary | ICD-10-CM | POA: Diagnosis not present

## 2023-11-14 DIAGNOSIS — I472 Ventricular tachycardia, unspecified: Secondary | ICD-10-CM | POA: Insufficient documentation

## 2023-11-14 DIAGNOSIS — I2510 Atherosclerotic heart disease of native coronary artery without angina pectoris: Secondary | ICD-10-CM | POA: Diagnosis not present

## 2023-11-14 DIAGNOSIS — Z950 Presence of cardiac pacemaker: Secondary | ICD-10-CM | POA: Diagnosis not present

## 2023-11-14 LAB — COMPREHENSIVE METABOLIC PANEL WITH GFR
ALT: 22 U/L (ref 0–44)
AST: 22 U/L (ref 15–41)
Albumin: 4 g/dL (ref 3.5–5.0)
Alkaline Phosphatase: 76 U/L (ref 38–126)
Anion gap: 7 (ref 5–15)
BUN: 17 mg/dL (ref 8–23)
CO2: 26 mmol/L (ref 22–32)
Calcium: 9.6 mg/dL (ref 8.9–10.3)
Chloride: 106 mmol/L (ref 98–111)
Creatinine, Ser: 1.5 mg/dL — ABNORMAL HIGH (ref 0.61–1.24)
GFR, Estimated: 49 mL/min — ABNORMAL LOW (ref >60)
Glucose, Bld: 99 mg/dL (ref 70–99)
Potassium: 5.1 mmol/L (ref 3.5–5.1)
Sodium: 139 mmol/L (ref 135–145)
Total Bilirubin: 0.9 mg/dL (ref 0.0–1.2)
Total Protein: 7.4 g/dL (ref 6.5–8.1)

## 2023-11-14 LAB — ECHOCARDIOGRAM COMPLETE
Area-P 1/2: 4.99 cm2
Calc EF: 18.5 %
Est EF: <20
S' Lateral: 7.5 cm
Single Plane A2C EF: 18 %
Single Plane A4C EF: 17.5 %

## 2023-11-14 LAB — TSH: TSH: 0.685 u[IU]/mL (ref 0.350–4.500)

## 2023-11-14 LAB — DIGOXIN LEVEL: Digoxin Level: 0.6 ng/mL — ABNORMAL LOW (ref 0.8–2.0)

## 2023-11-14 LAB — BRAIN NATRIURETIC PEPTIDE: B Natriuretic Peptide: 1263.2 pg/mL — ABNORMAL HIGH (ref 0.0–100.0)

## 2023-11-14 MED ORDER — DIGOXIN 125 MCG PO TABS
0.1250 mg | ORAL_TABLET | Freq: Every day | ORAL | 3 refills | Status: AC
  Filled 2023-11-14: qty 90, 90d supply, fill #0
  Filled 2024-02-23: qty 90, 90d supply, fill #1

## 2023-11-14 NOTE — Patient Instructions (Signed)
 CHANGE Digoxin  to 125 mcg daily.  Labs done today, your results will be available in MyChart, we will contact you for abnormal readings.  Your physician recommends that you schedule a follow-up appointment in: 3 months.  If you have any questions or concerns before your next appointment please send us  a message through [place] or call our office at [phone].    TO LEAVE A MESSAGE FOR THE NURSE SELECT OPTION 2, PLEASE LEAVE A MESSAGE INCLUDING: YOUR NAME DATE OF BIRTH CALL BACK NUMBER REASON FOR CALL**this is important as we prioritize the call backs  YOU WILL RECEIVE A CALL BACK THE SAME DAY AS LONG AS YOU CALL BEFORE 4:00 PM  At the [facility], you and your health needs are our priority. As part of our continuing mission to provide you with exceptional heart care, we have created designated Provider Care Teams. These Care Teams include your primary Cardiologist (physician) and Advanced Practice Providers (APPs- Physician Assistants and Nurse Practitioners) who all work together to provide you with the care you need, when you need it.   You may see any of the following providers on your designated Care Team at your next follow up: Dr Toribio Fuel Dr Ezra Shuck Dr. Ria Commander Dr. Morene Brownie Amy Lenetta, NP Caffie Shed, [place] [facility] [place], [place] Beckey Coe, NP [place] Lee, NP Ellouise Class, NP Tinnie Redman, PharmD Jaun Bash, PharmD   Please be sure to bring in all your medications bottles to every appointment.    Thank you for choosing [facility]

## 2023-11-16 ENCOUNTER — Ambulatory Visit (HOSPITAL_COMMUNITY): Payer: Self-pay | Admitting: Cardiology

## 2023-11-16 DIAGNOSIS — I5022 Chronic systolic (congestive) heart failure: Secondary | ICD-10-CM
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
## 2023-11-17 ENCOUNTER — Other Ambulatory Visit: Payer: Self-pay

## 2023-11-17 ENCOUNTER — Other Ambulatory Visit (HOSPITAL_COMMUNITY): Payer: Self-pay

## 2023-11-17 MED ORDER — POTASSIUM CHLORIDE ER 10 MEQ PO TBCR: 10.0000 meq | EXTENDED_RELEASE_TABLET | Freq: Every day | ORAL | Status: DC

## 2023-11-17 MED ORDER — TORSEMIDE 20 MG PO TABS
10.0000 mg | ORAL_TABLET | Freq: Every day | ORAL | 3 refills | Status: DC
  Filled 2023-11-17: qty 45, 90d supply, fill #0

## 2023-11-17 NOTE — Telephone Encounter (Addendum)
 Pt aware, agreeable, and verbalized understanding labs ordered and scheduled, med list updated and Rx for Torsemide  sent to pharmacy   ----- Message from Ezra Shuck sent at 11/16/2023  9:07 PM EDT ----- Cut back on KCl to 10 mEq daily and start taking torsemide  10 mg daily with rising BNP and upper normal K.  BMET/BNP in 10 days.  ----- Message ----- From: Rebecka, Lab In [place] Sent: 11/14/2023   2:04 PM EDT To: Ezra GORMAN Shuck, MD

## 2023-11-20 ENCOUNTER — Other Ambulatory Visit (HOSPITAL_COMMUNITY): Payer: Self-pay

## 2023-11-20 ENCOUNTER — Telehealth (HOSPITAL_COMMUNITY): Payer: Self-pay | Admitting: Cardiology

## 2023-11-20 MED ORDER — POTASSIUM CHLORIDE ER 10 MEQ PO TBCR
10.0000 meq | EXTENDED_RELEASE_TABLET | ORAL | 3 refills | Status: AC
  Filled 2023-11-20: qty 45, 90d supply, fill #0

## 2023-11-20 MED ORDER — TORSEMIDE 20 MG PO TABS
10.0000 mg | ORAL_TABLET | ORAL | 3 refills | Status: DC
  Filled 2023-11-20: qty 45, 180d supply, fill #0

## 2023-11-20 NOTE — Telephone Encounter (Signed)
 Ask him to change the torsemide  and KCl to every other day rather than daily.  See if he thinks he could tolerate this.  BNP quite high, think he has some fluid on board and needs at least a low dose of standing diuretic.

## 2023-11-20 NOTE — Telephone Encounter (Signed)
 Patient called requesting to speak directly to provider  Reports he was recently placed back on torsemide  Reports increase in UOP (every 30 mins)  Educated on diuretics,BNP, and providers instructions. Advised to at least continue until labs are repeated x 1 week   Patient is not comfortable with continuing medication and would like to discuss   Please advise

## 2023-11-20 NOTE — Telephone Encounter (Signed)
 Pt aware.

## 2023-11-21 ENCOUNTER — Ambulatory Visit: Payer: Medicare PPO

## 2023-11-21 ENCOUNTER — Other Ambulatory Visit (HOSPITAL_COMMUNITY): Payer: Self-pay

## 2023-11-21 DIAGNOSIS — I5022 Chronic systolic (congestive) heart failure: Secondary | ICD-10-CM | POA: Diagnosis not present

## 2023-11-24 ENCOUNTER — Ambulatory Visit: Attending: Internal Medicine

## 2023-11-24 ENCOUNTER — Other Ambulatory Visit (HOSPITAL_COMMUNITY): Payer: Self-pay

## 2023-11-24 DIAGNOSIS — I5022 Chronic systolic (congestive) heart failure: Secondary | ICD-10-CM | POA: Diagnosis not present

## 2023-11-24 DIAGNOSIS — Z95810 Presence of automatic (implantable) cardiac defibrillator: Secondary | ICD-10-CM

## 2023-11-25 LAB — CUP PACEART REMOTE DEVICE CHECK
Battery Remaining Longevity: 106 mo
Battery Voltage: 2.99 V
Brady Statistic AP VP Percent: 9.79 %
Brady Statistic AP VS Percent: 0.01 %
Brady Statistic AS VP Percent: 88.54 %
Brady Statistic AS VS Percent: 1.66 %
Brady Statistic RA Percent Paced: 9.87 %
Brady Statistic RV Percent Paced: 0 %
Date Time Interrogation Session: 20251017001954
HighPow Impedance: 58 Ohm
Lead Channel Impedance Value: 247 Ohm
Lead Channel Impedance Value: 285 Ohm
Lead Channel Impedance Value: 342 Ohm
Lead Channel Impedance Value: 380 Ohm
Lead Channel Impedance Value: 399 Ohm
Lead Channel Impedance Value: 551 Ohm
Lead Channel Pacing Threshold Amplitude: 0.5 V
Lead Channel Pacing Threshold Pulse Width: 0.4 ms
Lead Channel Sensing Intrinsic Amplitude: 15.3 mV
Lead Channel Sensing Intrinsic Amplitude: 3.1 mV
Lead Channel Setting Pacing Amplitude: 2 V
Lead Channel Setting Pacing Amplitude: 2 V
Lead Channel Setting Pacing Pulse Width: 0.4 ms
Lead Channel Setting Sensing Sensitivity: 0.3 mV
Zone Setting Status: 755011
Zone Setting Status: 755011
Zone Setting Status: 755011

## 2023-11-26 ENCOUNTER — Ambulatory Visit: Admitting: Student

## 2023-11-26 ENCOUNTER — Ambulatory Visit: Payer: Self-pay | Admitting: Internal Medicine

## 2023-11-26 VITALS — BP 89/59 | HR 85 | Ht 73.0 in | Wt 191.2 lb

## 2023-11-26 DIAGNOSIS — M79605 Pain in left leg: Secondary | ICD-10-CM

## 2023-11-26 NOTE — Patient Instructions (Addendum)
 It was great to see you! Thank you for allowing me to participate in your care!   I recommend that you always bring your medications to each appointment as this makes it easy to ensure we are on the correct medications and helps us  not miss when refills are needed.  Our plans for today:  - Please go to your ultrasound appointment  - Continue to take all your medications as prescribed - Follow-up next week to ensure improvement  Take care and seek immediate care sooner if you develop any concerns. Please remember to show up 15 minutes before your scheduled appointment time!  Gladis Church, DO [facility]

## 2023-11-26 NOTE — Progress Notes (Signed)
    SUBJECTIVE:   CHIEF COMPLAINT / HPI:   Discussed the use of AI scribe software for clinical note transcription with the patient, who gave verbal consent to proceed.  History of Present Illness Philip Chavez is a 73 year old male with a history of blood clots who presents with left leg pain and swelling.  Left lower extremity pain and swelling - Sharp, intermittent pain localized to the lower part of the left leg - Pain occurs once or twice daily, often when sitting still - Swelling sometimes accompanies the pain - Sensation of a vein moving in the affected area - No pain in the upper leg, hips, or back - No recent trauma or surgery to the leg - Concern for possible new blood clot due to prior history of thrombosis - Currently taking Eliquis , digoxin , Entresto , spironolactone , and iron - Recently restarted torsemide , taking half a pill every other day  OBJECTIVE:   BP (!) 89/59   Pulse 85   Ht 6' 1 (1.854 m)   Wt 191 lb 3.2 oz (86.7 kg)   SpO2 99%   BMI 25.23 kg/m    General: NAD, pleasant Cardio: RRR Respiratory: CTAB, normal wob on RA Skin: Warm and dry Left lower extremity: No gross deformity, mild swelling over anterior lateral lower extremity, varicose veins present, tender nodule of superficial vein.  No drainage, no ecchymosis, no erythema, not hot to touch.  TTP over anterolateral varicose veins, no TTP over popliteal fossa.  5/5 strength throughout entire left lower extremity including hip.  Full range of motion.  Straight leg negative, FADIR/FABER negative, strong hip abduction, Ober's negative.   ASSESSMENT/PLAN:   Assessment & Plan Left leg pain Based on history, swelling, patient concern for left: Superficial cellulitis versus DVT despite Eliquis  use.  Need to rule out DVT with ultrasound today.  At this time exam is not particularly concerning for MSK pathology, however if DVTs are negative could pursue plain film of the knee and tibia to look for  arthritis/fractures. - DVT ultrasound scheduled for tomorrow - Return and ED precautions discussed - Follow-up in 1 week    Gladis Church, DO [facility]

## 2023-11-27 ENCOUNTER — Ambulatory Visit (HOSPITAL_COMMUNITY)
Admission: RE | Admit: 2023-11-27 | Discharge: 2023-11-27 | Disposition: A | Discharge status: Home / Self Care | Source: Ambulatory Visit | Attending: Family Medicine | Admitting: Family Medicine

## 2023-11-27 DIAGNOSIS — M79605 Pain in left leg: Secondary | ICD-10-CM | POA: Insufficient documentation

## 2023-11-27 NOTE — Progress Notes (Signed)
 Remote ICD Transmission

## 2023-11-28 ENCOUNTER — Ambulatory Visit: Payer: Self-pay | Admitting: Student

## 2023-11-28 NOTE — Progress Notes (Signed)
 EPIC Encounter for ICM Monitoring  Patient Name: Philip Chavez is a 73 y.o. male Date: 11/28/2023 Primary Care Physican: Janna Ferrier, DO Primary Cardiologist: Rolan Electrophysiologist: Waddell BiV Pacing:  97.0%        05/08/2022 Weight: 195 lbs 06/12/2022 Weight: 195 lbs 10/31/2022 Weight: 196 lbs 02/19/2023 Weight: 195-198 03/26/2023 Weight: 195-197 lbs 04/30/2023 Weight: 195-198 lbs 06/02/2023 Weight: 187 lbs 08/11/2023 Office Weight: 193 lbs 09/05/2023 Weight: 187-189 lbs  10/09/2023 Weight: 189-191 lbs   Since 20-Nov-2023 Time in AT/AF   0.0 hours/day (0.0 %)         Spoke with patient and heart failure questions reviewed.  Transmission results reviewed.  Pt asymptomatic for fluid accumulation.  Reports feeling well at this time and voices no complaints.      Since 10/07/2023 ICM Remote Transmission: OptiVol Thoracic impedance suggesting normal fluid levels.    Prescribed:  Torsemide  20 mg Take 0.5 tablet (10 mg total) by mouth every other day.  Potassium 10 mEq take 1 tablet by mouth every other day Spironolactone  25 mg take 1 tablet by mouth once a day   Labs: 12/01/2023 Repeat BMET scheduled at HF clinic 11/14/2023 Creatinine 1.50, UN 17, Potassium 5.1, Sodium 139, GFR 49, BNP 1236.2 08/11/2023 Creatinine 1.43, BUN 21, Potassium 4.0, Sodium 137 A complete set of results can be found in Results Review.   Recommendations:  No changes and encouraged to call if experiencing any fluid symptoms.   Follow-up plan: ICM clinic phone appointment on 12/29/2023.   91 day device clinic remote transmission 02/20/2023.     EP/Cardiology Office Visits:  Recall 11/12/2023 with Dr Waddell.    12/19/2023 with HF Clinic.      Copy of ICM check sent to Dr. Waddell.    Remote monitoring is medically necessary for Heart Failure Management.    Daily Thoracic Impedance ICM trend: 08/24/2023 through 11/23/2023.    12-14 Month Thoracic Impedance ICM trend:     Mitzie GORMAN Garner, RN 11/28/2023 7:15  AM

## 2023-11-28 NOTE — Telephone Encounter (Signed)
 Called patient, confirmed identity.  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  Recommend further evaluation in our clinic for leg pain.

## 2023-12-01 ENCOUNTER — Ambulatory Visit (HOSPITAL_COMMUNITY)
Admission: RE | Admit: 2023-12-01 | Discharge: 2023-12-01 | Disposition: A | Discharge status: Home / Self Care | Source: Ambulatory Visit | Attending: Cardiology | Admitting: Cardiology

## 2023-12-01 ENCOUNTER — Ambulatory Visit (HOSPITAL_COMMUNITY): Payer: Self-pay | Admitting: Cardiology

## 2023-12-01 DIAGNOSIS — I5022 Chronic systolic (congestive) heart failure: Secondary | ICD-10-CM | POA: Insufficient documentation

## 2023-12-01 LAB — BASIC METABOLIC PANEL WITH GFR
Anion gap: 11 (ref 5–15)
BUN: 19 mg/dL (ref 8–23)
CO2: 28 mmol/L (ref 22–32)
Calcium: 9.1 mg/dL (ref 8.9–10.3)
Chloride: 104 mmol/L (ref 98–111)
Creatinine, Ser: 1.65 mg/dL — ABNORMAL HIGH (ref 0.61–1.24)
GFR, Estimated: 44 mL/min — ABNORMAL LOW (ref >60)
Glucose, Bld: 81 mg/dL (ref 70–99)
Potassium: 4.2 mmol/L (ref 3.5–5.1)
Sodium: 143 mmol/L (ref 135–145)

## 2023-12-01 LAB — BRAIN NATRIURETIC PEPTIDE: B Natriuretic Peptide: 1331.6 pg/mL — ABNORMAL HIGH (ref 0.0–100.0)

## 2023-12-03 MED ORDER — TORSEMIDE 20 MG PO TABS: 10.0000 mg | ORAL_TABLET | Freq: Every day | ORAL | Status: AC

## 2023-12-08 ENCOUNTER — Other Ambulatory Visit (HOSPITAL_COMMUNITY): Payer: Self-pay

## 2023-12-08 ENCOUNTER — Other Ambulatory Visit (HOSPITAL_COMMUNITY): Payer: Self-pay | Admitting: Cardiology

## 2023-12-08 MED ORDER — SACUBITRIL-VALSARTAN 49-51 MG PO TABS
1.0000 | ORAL_TABLET | Freq: Two times a day (BID) | ORAL | 11 refills | Status: AC
  Filled 2023-12-08 – 2023-12-30 (×2): qty 60, 30d supply, fill #0
  Filled 2024-01-30: qty 60, 30d supply, fill #1
  Filled 2024-03-02: qty 60, 30d supply, fill #2

## 2023-12-09 ENCOUNTER — Other Ambulatory Visit (HOSPITAL_COMMUNITY): Payer: Self-pay

## 2023-12-10 ENCOUNTER — Other Ambulatory Visit (HOSPITAL_COMMUNITY): Payer: Self-pay

## 2023-12-16 ENCOUNTER — Other Ambulatory Visit: Payer: Self-pay | Admitting: Internal Medicine

## 2023-12-16 ENCOUNTER — Other Ambulatory Visit (HOSPITAL_COMMUNITY): Payer: Self-pay

## 2023-12-16 DIAGNOSIS — J449 Chronic obstructive pulmonary disease, unspecified: Secondary | ICD-10-CM | POA: Diagnosis not present

## 2023-12-16 DIAGNOSIS — I4892 Unspecified atrial flutter: Secondary | ICD-10-CM | POA: Diagnosis not present

## 2023-12-16 DIAGNOSIS — I2782 Chronic pulmonary embolism: Secondary | ICD-10-CM | POA: Diagnosis not present

## 2023-12-16 DIAGNOSIS — N1831 Chronic kidney disease, stage 3a: Secondary | ICD-10-CM | POA: Diagnosis not present

## 2023-12-16 DIAGNOSIS — I429 Cardiomyopathy, unspecified: Secondary | ICD-10-CM | POA: Diagnosis not present

## 2023-12-16 DIAGNOSIS — I509 Heart failure, unspecified: Secondary | ICD-10-CM | POA: Diagnosis not present

## 2023-12-16 DIAGNOSIS — I25119 Atherosclerotic heart disease of native coronary artery with unspecified angina pectoris: Secondary | ICD-10-CM | POA: Diagnosis not present

## 2023-12-16 DIAGNOSIS — I472 Ventricular tachycardia, unspecified: Secondary | ICD-10-CM

## 2023-12-16 DIAGNOSIS — F1021 Alcohol dependence, in remission: Secondary | ICD-10-CM | POA: Diagnosis not present

## 2023-12-16 DIAGNOSIS — I4891 Unspecified atrial fibrillation: Secondary | ICD-10-CM | POA: Diagnosis not present

## 2023-12-16 MED ORDER — AMIODARONE HCL 200 MG PO TABS
200.0000 mg | ORAL_TABLET | Freq: Every day | ORAL | 3 refills | Status: AC
  Filled 2023-12-16 – 2023-12-17 (×4): qty 90, 90d supply, fill #0

## 2023-12-17 ENCOUNTER — Other Ambulatory Visit (HOSPITAL_COMMUNITY): Payer: Self-pay

## 2023-12-29 ENCOUNTER — Ambulatory Visit: Attending: Internal Medicine

## 2023-12-29 DIAGNOSIS — I5022 Chronic systolic (congestive) heart failure: Secondary | ICD-10-CM | POA: Diagnosis not present

## 2023-12-29 DIAGNOSIS — Z95810 Presence of automatic (implantable) cardiac defibrillator: Secondary | ICD-10-CM

## 2023-12-30 ENCOUNTER — Other Ambulatory Visit (HOSPITAL_COMMUNITY): Payer: Self-pay

## 2023-12-31 NOTE — Progress Notes (Signed)
 EPIC Encounter for ICM Monitoring  Patient Name: Philip Chavez is a 73 y.o. male Date: 12/31/2023 Primary Care Physican: Janna Ferrier, DO Primary Cardiologist: Rolan Electrophysiologist: Waddell BiV Pacing:  97.8%        05/08/2022 Weight: 195 lbs 06/12/2022 Weight: 195 lbs 10/31/2022 Weight: 196 lbs 02/19/2023 Weight: 195-198 03/26/2023 Weight: 195-197 lbs 04/30/2023 Weight: 195-198 lbs 06/02/2023 Weight: 187 lbs 08/11/2023 Office Weight: 193 lbs 09/05/2023 Weight: 187-189 lbs  10/09/2023 Weight: 189-191 lbs   Since 23-Nov-2023 Time in AT/AF   1.7 hours/day (7.1 %)         Spoke with patient and heart failure questions reviewed.  Transmission results reviewed.  Pt asymptomatic for fluid accumulation.  Reports feeling well at this time and voices no complaints.     Since 11/24/2023 ICM Remote Transmission: OptiVol Thoracic impedance suggesting normal fluid levels.  Message sent 12/31/2023 to device clinic triage to review AT/AF.    Prescribed:  Torsemide  20 mg Take 0.5 tablet (10 mg total) by mouth every other day.  Potassium 10 mEq take 1 tablet by mouth every other day Spironolactone  25 mg take 1 tablet by mouth once a day   Labs: 12/01/2023 Creatinine 1.65, BUN 19, Potassium 4.2, Sodium 143, GFR 44, BNP 1,331.6 11/14/2023 Creatinine 1.50, BUN 17, Potassium 5.1, Sodium 139, GFR 49, BNP 1236.2 08/11/2023 Creatinine 1.43, BUN 21, Potassium 4.0, Sodium 137 A complete set of results can be found in Results Review.   Recommendations:  No changes and encouraged to call if experiencing any fluid symptoms.   Follow-up plan: ICM clinic phone appointment on 02/09/2024.   91 day device clinic remote transmission 02/20/2023.     EP/Cardiology Office Visits:  Recall 11/12/2023 with Dr Waddell.   02/18/2024 with HF Clinic.      Copy of ICM check sent to Dr. Waddell.    Remote monitoring is medically necessary for Heart Failure Management.    Daily Thoracic Impedance ICM trend: 09/28/2023 through  12/29/2023.    12-14 Month Thoracic Impedance ICM trend:     Mitzie GORMAN Garner, RN 12/31/2023 8:45 AM

## 2024-01-05 ENCOUNTER — Other Ambulatory Visit (HOSPITAL_COMMUNITY): Payer: Self-pay

## 2024-01-07 ENCOUNTER — Other Ambulatory Visit (HOSPITAL_COMMUNITY): Payer: Self-pay

## 2024-02-07 ENCOUNTER — Other Ambulatory Visit (HOSPITAL_COMMUNITY): Payer: Self-pay

## 2024-02-09 ENCOUNTER — Ambulatory Visit: Attending: Cardiology

## 2024-02-09 DIAGNOSIS — I5022 Chronic systolic (congestive) heart failure: Secondary | ICD-10-CM | POA: Diagnosis not present

## 2024-02-09 DIAGNOSIS — Z95810 Presence of automatic (implantable) cardiac defibrillator: Secondary | ICD-10-CM | POA: Diagnosis not present

## 2024-02-10 ENCOUNTER — Other Ambulatory Visit (HOSPITAL_COMMUNITY): Payer: Self-pay

## 2024-02-13 NOTE — Progress Notes (Signed)
 EPIC Encounter for ICM Monitoring  Patient Name: Philip Chavez is a 74 y.o. male Date: 02/13/2024 Primary Care Physican: Janna Ferrier, DO Primary Cardiologist: Rolan Electrophysiologist: Kennyth BiV Pacing:  98.6%        05/08/2022 Weight: 195 lbs 06/12/2022 Weight: 195 lbs 10/31/2022 Weight: 196 lbs 02/19/2023 Weight: 195-198 03/26/2023 Weight: 195-197 lbs 04/30/2023 Weight: 195-198 lbs 06/02/2023 Weight: 187 lbs 08/11/2023 Office Weight: 193 lbs 09/05/2023 Weight: 187-189 lbs  10/09/2023 Weight: 189-191 lbs    Since 28-Dec-2023 Time in AT/AF  1.1 hours/day (4.7 %)         Transmission results reviewed.      Since 12/29/2023 ICM Remote Transmission: OptiVol Thoracic impedance suggesting normal fluid levels.    Prescribed:  Torsemide  20 mg Take 0.5 tablet (10 mg total) by mouth every other day.  Potassium 10 mEq take 1 tablet by mouth every other day Spironolactone  25 mg take 1 tablet by mouth once a day   Labs: 12/01/2023 Creatinine 1.65, BUN 19, Potassium 4.2, Sodium 143, GFR 44, BNP 1,331.6 11/14/2023 Creatinine 1.50, BUN 17, Potassium 5.1, Sodium 139, GFR 49, BNP 1236.2 08/11/2023 Creatinine 1.43, BUN 21, Potassium 4.0, Sodium 137 A complete set of results can be found in Results Review.   Recommendations:  No changes.   Follow-up plan: ICM clinic phone appointment on 03/15/2024.   91 day device clinic remote transmission 02/20/2023.     EP/Cardiology Office Visits:  Recall 11/12/2023 with Dr Waddell.   02/18/2024 with HF Clinic.      Copy of ICM check sent to Dr. Kennyth.     Remote monitoring is medically necessary for Heart Failure Management.    Daily Thoracic Impedance ICM trend: 11/09/2023 through 02/08/2024.    12-14 Month Thoracic Impedance ICM trend:     Mitzie GORMAN Garner, RN 02/13/2024 1:34 PM

## 2024-02-17 ENCOUNTER — Telehealth (HOSPITAL_COMMUNITY): Payer: Self-pay

## 2024-02-17 NOTE — Telephone Encounter (Signed)
 Called to confirm/remind patient of their appointment [facility] on 02/18/24.   Appointment:   [x] Confirmed  [] Left mess   [] No answer/No voice mail  [] VM Full/unable to leave message  [] Phone not in service  Patient reminded to bring all medications and/or complete list.  Confirmed patient has transportation. Gave directions, instructed to utilize valet parking.
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
## 2024-02-18 ENCOUNTER — Ambulatory Visit (HOSPITAL_COMMUNITY)
Admission: RE | Admit: 2024-02-18 | Discharge: 2024-02-18 | Disposition: A | Discharge status: Home / Self Care | Source: Ambulatory Visit | Attending: Family Medicine | Admitting: Family Medicine

## 2024-02-18 ENCOUNTER — Ambulatory Visit (HOSPITAL_COMMUNITY): Payer: Self-pay | Admitting: Family Medicine

## 2024-02-18 ENCOUNTER — Encounter (HOSPITAL_COMMUNITY): Payer: Self-pay

## 2024-02-18 VITALS — BP 102/64 | HR 62 | Ht 73.0 in | Wt 194.2 lb

## 2024-02-18 DIAGNOSIS — I2510 Atherosclerotic heart disease of native coronary artery without angina pectoris: Secondary | ICD-10-CM | POA: Diagnosis not present

## 2024-02-18 DIAGNOSIS — N183 Chronic kidney disease, stage 3 unspecified: Secondary | ICD-10-CM | POA: Insufficient documentation

## 2024-02-18 DIAGNOSIS — Z8249 Family history of ischemic heart disease and other diseases of the circulatory system: Secondary | ICD-10-CM | POA: Diagnosis not present

## 2024-02-18 DIAGNOSIS — I5022 Chronic systolic (congestive) heart failure: Secondary | ICD-10-CM | POA: Diagnosis not present

## 2024-02-18 DIAGNOSIS — I428 Other cardiomyopathies: Secondary | ICD-10-CM | POA: Insufficient documentation

## 2024-02-18 DIAGNOSIS — Z86711 Personal history of pulmonary embolism: Secondary | ICD-10-CM | POA: Insufficient documentation

## 2024-02-18 DIAGNOSIS — E785 Hyperlipidemia, unspecified: Secondary | ICD-10-CM | POA: Diagnosis not present

## 2024-02-18 DIAGNOSIS — Z95810 Presence of automatic (implantable) cardiac defibrillator: Secondary | ICD-10-CM | POA: Diagnosis not present

## 2024-02-18 DIAGNOSIS — Z79899 Other long term (current) drug therapy: Secondary | ICD-10-CM | POA: Insufficient documentation

## 2024-02-18 DIAGNOSIS — Z86718 Personal history of other venous thrombosis and embolism: Secondary | ICD-10-CM | POA: Insufficient documentation

## 2024-02-18 DIAGNOSIS — I480 Paroxysmal atrial fibrillation: Secondary | ICD-10-CM | POA: Diagnosis not present

## 2024-02-18 DIAGNOSIS — I442 Atrioventricular block, complete: Secondary | ICD-10-CM | POA: Diagnosis not present

## 2024-02-18 DIAGNOSIS — I472 Ventricular tachycardia, unspecified: Secondary | ICD-10-CM

## 2024-02-18 DIAGNOSIS — Z7901 Long term (current) use of anticoagulants: Secondary | ICD-10-CM | POA: Diagnosis not present

## 2024-02-18 DIAGNOSIS — N1831 Chronic kidney disease, stage 3a: Secondary | ICD-10-CM | POA: Diagnosis not present

## 2024-02-18 DIAGNOSIS — I4892 Unspecified atrial flutter: Secondary | ICD-10-CM | POA: Insufficient documentation

## 2024-02-18 LAB — CBC
HCT: 48.5 % (ref 39.0–52.0)
Hemoglobin: 16.3 g/dL (ref 13.0–17.0)
MCH: 33.6 pg (ref 26.0–34.0)
MCHC: 33.6 g/dL (ref 30.0–36.0)
MCV: 100 fL (ref 80.0–100.0)
Platelets: 162 K/uL (ref 150–400)
RBC: 4.85 MIL/uL (ref 4.22–5.81)
RDW: 14.3 % (ref 11.5–15.5)
WBC: 5.3 K/uL (ref 4.0–10.5)
nRBC: 0 % (ref 0.0–0.2)

## 2024-02-18 LAB — BASIC METABOLIC PANEL WITH GFR
Anion gap: 10 (ref 5–15)
BUN: 20 mg/dL (ref 8–23)
CO2: 29 mmol/L (ref 22–32)
Calcium: 9.3 mg/dL (ref 8.9–10.3)
Chloride: 98 mmol/L (ref 98–111)
Creatinine, Ser: 1.72 mg/dL — ABNORMAL HIGH (ref 0.61–1.24)
GFR, Estimated: 41 mL/min — ABNORMAL LOW
Glucose, Bld: 114 mg/dL — ABNORMAL HIGH (ref 70–99)
Potassium: 4.5 mmol/L (ref 3.5–5.1)
Sodium: 136 mmol/L (ref 135–145)

## 2024-02-18 LAB — PRO BRAIN NATRIURETIC PEPTIDE: Pro Brain Natriuretic Peptide: 1632 pg/mL — ABNORMAL HIGH

## 2024-02-18 NOTE — Patient Instructions (Signed)
 No change in medications. Labs today - will call you if abnormal. We have sent a request to your EP office to schedule your appointment. You may reach them at number below to schedule as well. Return to see Dr. Rolan in 3 months - see below. Please call us  at [phone] if any questions or concerns prior to your next appointment.

## 2024-02-20 ENCOUNTER — Ambulatory Visit (INDEPENDENT_AMBULATORY_CARE_PROVIDER_SITE_OTHER): Payer: Medicare PPO

## 2024-02-20 DIAGNOSIS — I428 Other cardiomyopathies: Secondary | ICD-10-CM

## 2024-02-21 ENCOUNTER — Other Ambulatory Visit (HOSPITAL_COMMUNITY): Payer: Self-pay

## 2024-02-23 ENCOUNTER — Ambulatory Visit: Payer: Self-pay | Admitting: Cardiology

## 2024-02-23 ENCOUNTER — Other Ambulatory Visit (HOSPITAL_COMMUNITY): Payer: Self-pay

## 2024-02-23 LAB — CUP PACEART REMOTE DEVICE CHECK
Battery Remaining Longevity: 103 mo
Battery Voltage: 2.99 V
Brady Statistic AP VP Percent: 9.32 %
Brady Statistic AP VS Percent: 0.02 %
Brady Statistic AS VP Percent: 88.11 %
Brady Statistic AS VS Percent: 2.55 %
Brady Statistic RA Percent Paced: 10.54 %
Brady Statistic RV Percent Paced: 0 %
Date Time Interrogation Session: 20260115234747
HighPow Impedance: 52 Ohm
Lead Channel Impedance Value: 209 Ohm
Lead Channel Impedance Value: 304 Ohm
Lead Channel Impedance Value: 323 Ohm
Lead Channel Impedance Value: 380 Ohm
Lead Channel Impedance Value: 399 Ohm
Lead Channel Impedance Value: 494 Ohm
Lead Channel Pacing Threshold Amplitude: 0.625 V
Lead Channel Pacing Threshold Pulse Width: 0.4 ms
Lead Channel Sensing Intrinsic Amplitude: 16.6 mV
Lead Channel Sensing Intrinsic Amplitude: 3.1 mV
Lead Channel Setting Pacing Amplitude: 2 V
Lead Channel Setting Pacing Amplitude: 2 V
Lead Channel Setting Pacing Pulse Width: 0.4 ms
Lead Channel Setting Sensing Sensitivity: 0.3 mV
Zone Setting Status: 755011
Zone Setting Status: 755011
Zone Setting Status: 755011

## 2024-02-26 NOTE — Progress Notes (Signed)
 Remote ICD Transmission

## 2024-03-03 ENCOUNTER — Other Ambulatory Visit: Payer: Self-pay

## 2024-03-04 NOTE — Progress Notes (Signed)
 31 day ICM Remote transmission canceled due to [facility] clinic is on hold until further notice.  91 day remote monitoring will continue per protocol.

## 2024-03-09 ENCOUNTER — Other Ambulatory Visit (HOSPITAL_COMMUNITY): Payer: Self-pay

## 2024-03-09 ENCOUNTER — Other Ambulatory Visit: Payer: Self-pay

## 2024-03-15 ENCOUNTER — Ambulatory Visit

## 2024-05-26 ENCOUNTER — Ambulatory Visit (HOSPITAL_COMMUNITY): Admitting: Cardiology

## 2024-06-18 ENCOUNTER — Ambulatory Visit: Admitting: Cardiology

## 2024-08-30 ENCOUNTER — Encounter
